# Patient Record
Sex: Male | Born: 1960 | Race: White | Hispanic: No | Smoking: Former smoker
Health system: Southern US, Community
[De-identification: ages and names within clinical notes are randomized; demographics above are authoritative.]

## PROBLEM LIST (undated history)

## (undated) DIAGNOSIS — I10 Essential (primary) hypertension: Secondary | ICD-10-CM

## (undated) DIAGNOSIS — R200 Anesthesia of skin: Secondary | ICD-10-CM

## (undated) DIAGNOSIS — Z973 Presence of spectacles and contact lenses: Secondary | ICD-10-CM

## (undated) DIAGNOSIS — I4891 Unspecified atrial fibrillation: Secondary | ICD-10-CM

## (undated) DIAGNOSIS — Z9889 Other specified postprocedural states: Secondary | ICD-10-CM

## (undated) DIAGNOSIS — J45909 Unspecified asthma, uncomplicated: Secondary | ICD-10-CM

## (undated) DIAGNOSIS — R52 Pain, unspecified: Secondary | ICD-10-CM

## (undated) DIAGNOSIS — F329 Major depressive disorder, single episode, unspecified: Secondary | ICD-10-CM

## (undated) DIAGNOSIS — G8929 Other chronic pain: Secondary | ICD-10-CM

## (undated) DIAGNOSIS — R51 Headache: Secondary | ICD-10-CM

## (undated) DIAGNOSIS — J449 Chronic obstructive pulmonary disease, unspecified: Secondary | ICD-10-CM

## (undated) DIAGNOSIS — M2540 Effusion, unspecified joint: Secondary | ICD-10-CM

## (undated) DIAGNOSIS — K429 Umbilical hernia without obstruction or gangrene: Secondary | ICD-10-CM

## (undated) DIAGNOSIS — Z972 Presence of dental prosthetic device (complete) (partial): Secondary | ICD-10-CM

## (undated) DIAGNOSIS — M545 Low back pain, unspecified: Secondary | ICD-10-CM

## (undated) DIAGNOSIS — I671 Cerebral aneurysm, nonruptured: Secondary | ICD-10-CM

## (undated) DIAGNOSIS — R351 Nocturia: Secondary | ICD-10-CM

## (undated) DIAGNOSIS — I2510 Atherosclerotic heart disease of native coronary artery without angina pectoris: Secondary | ICD-10-CM

## (undated) DIAGNOSIS — N189 Chronic kidney disease, unspecified: Secondary | ICD-10-CM

## (undated) DIAGNOSIS — R350 Frequency of micturition: Secondary | ICD-10-CM

## (undated) DIAGNOSIS — M1990 Unspecified osteoarthritis, unspecified site: Secondary | ICD-10-CM

## (undated) DIAGNOSIS — M2550 Pain in unspecified joint: Secondary | ICD-10-CM

## (undated) DIAGNOSIS — Z8489 Family history of other specified conditions: Secondary | ICD-10-CM

## (undated) DIAGNOSIS — Z8719 Personal history of other diseases of the digestive system: Secondary | ICD-10-CM

## (undated) DIAGNOSIS — E785 Hyperlipidemia, unspecified: Secondary | ICD-10-CM

## (undated) DIAGNOSIS — H269 Unspecified cataract: Secondary | ICD-10-CM

## (undated) DIAGNOSIS — F32A Depression, unspecified: Secondary | ICD-10-CM

## (undated) DIAGNOSIS — R112 Nausea with vomiting, unspecified: Secondary | ICD-10-CM

## (undated) DIAGNOSIS — I729 Aneurysm of unspecified site: Secondary | ICD-10-CM

## (undated) DIAGNOSIS — E119 Type 2 diabetes mellitus without complications: Secondary | ICD-10-CM

## (undated) DIAGNOSIS — K219 Gastro-esophageal reflux disease without esophagitis: Secondary | ICD-10-CM

## (undated) DIAGNOSIS — I639 Cerebral infarction, unspecified: Secondary | ICD-10-CM

## (undated) DIAGNOSIS — J189 Pneumonia, unspecified organism: Secondary | ICD-10-CM

## (undated) DIAGNOSIS — F319 Bipolar disorder, unspecified: Secondary | ICD-10-CM

## (undated) DIAGNOSIS — C4492 Squamous cell carcinoma of skin, unspecified: Secondary | ICD-10-CM

## (undated) DIAGNOSIS — G4730 Sleep apnea, unspecified: Secondary | ICD-10-CM

## (undated) HISTORY — PX: MULTIPLE TOOTH EXTRACTIONS: SHX2053

## (undated) HISTORY — PX: EXCISIONAL HEMORRHOIDECTOMY: SHX1541

## (undated) HISTORY — DX: Squamous cell carcinoma of skin, unspecified: C44.92

## (undated) HISTORY — DX: Chronic obstructive pulmonary disease, unspecified: J44.9

## (undated) HISTORY — PX: GANGLION CYST EXCISION: SHX1691

## (undated) HISTORY — PX: RECTAL SURGERY: SHX760

## (undated) HISTORY — PX: TRACHEOSTOMY CLOSURE: SHX458

## (undated) HISTORY — PX: COLONOSCOPY: SHX174

## (undated) HISTORY — DX: Atherosclerotic heart disease of native coronary artery without angina pectoris: I25.10

---
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
## 1994-10-12 HISTORY — PX: SHOULDER ARTHROSCOPY WITH ROTATOR CUFF REPAIR: SHX5685

## 1995-10-13 HISTORY — PX: SHOULDER OPEN ROTATOR CUFF REPAIR: SHX2407

## 1998-04-30 ENCOUNTER — Emergency Department (HOSPITAL_COMMUNITY): Admission: EM | Admit: 1998-04-30 | Discharge: 1998-04-30 | Payer: Self-pay | Admitting: Emergency Medicine

## 2000-06-07 ENCOUNTER — Emergency Department (HOSPITAL_COMMUNITY): Admission: EM | Admit: 2000-06-07 | Discharge: 2000-06-08 | Payer: Self-pay

## 2000-06-08 ENCOUNTER — Encounter: Payer: Self-pay | Admitting: Emergency Medicine

## 2002-03-24 ENCOUNTER — Ambulatory Visit (HOSPITAL_BASED_OUTPATIENT_CLINIC_OR_DEPARTMENT_OTHER): Admission: RE | Admit: 2002-03-24 | Discharge: 2002-03-24 | Payer: Self-pay | Admitting: Orthopedic Surgery

## 2002-08-29 ENCOUNTER — Emergency Department (HOSPITAL_COMMUNITY): Admission: EM | Admit: 2002-08-29 | Discharge: 2002-08-29 | Payer: Self-pay | Admitting: Emergency Medicine

## 2002-09-07 ENCOUNTER — Emergency Department (HOSPITAL_COMMUNITY): Admission: EM | Admit: 2002-09-07 | Discharge: 2002-09-08 | Payer: Self-pay

## 2002-12-21 ENCOUNTER — Emergency Department (HOSPITAL_COMMUNITY): Admission: EM | Admit: 2002-12-21 | Discharge: 2002-12-21 | Payer: Self-pay | Admitting: Emergency Medicine

## 2003-04-05 ENCOUNTER — Emergency Department (HOSPITAL_COMMUNITY): Admission: EM | Admit: 2003-04-05 | Discharge: 2003-04-05 | Payer: Self-pay

## 2003-11-14 ENCOUNTER — Emergency Department (HOSPITAL_COMMUNITY): Admission: EM | Admit: 2003-11-14 | Discharge: 2003-11-14 | Payer: Self-pay | Admitting: Emergency Medicine

## 2003-12-04 ENCOUNTER — Emergency Department (HOSPITAL_COMMUNITY): Admission: EM | Admit: 2003-12-04 | Discharge: 2003-12-04 | Payer: Self-pay | Admitting: Emergency Medicine

## 2004-03-16 ENCOUNTER — Emergency Department (HOSPITAL_COMMUNITY): Admission: EM | Admit: 2004-03-16 | Discharge: 2004-03-17 | Payer: Self-pay | Admitting: Emergency Medicine

## 2004-04-11 ENCOUNTER — Ambulatory Visit (HOSPITAL_COMMUNITY): Admission: RE | Admit: 2004-04-11 | Discharge: 2004-04-11 | Payer: Self-pay | Admitting: Orthopaedic Surgery

## 2004-08-11 ENCOUNTER — Emergency Department (HOSPITAL_COMMUNITY): Admission: EM | Admit: 2004-08-11 | Discharge: 2004-08-11 | Payer: Self-pay | Admitting: Emergency Medicine

## 2004-10-15 ENCOUNTER — Emergency Department (HOSPITAL_COMMUNITY): Admission: EM | Admit: 2004-10-15 | Discharge: 2004-10-15 | Payer: Self-pay | Admitting: Emergency Medicine

## 2005-12-16 ENCOUNTER — Emergency Department (HOSPITAL_COMMUNITY): Admission: EM | Admit: 2005-12-16 | Discharge: 2005-12-16 | Payer: Self-pay | Admitting: Emergency Medicine

## 2006-01-06 ENCOUNTER — Ambulatory Visit: Payer: Self-pay | Admitting: Family Medicine

## 2006-01-06 ENCOUNTER — Ambulatory Visit: Payer: Self-pay | Admitting: *Deleted

## 2006-01-08 ENCOUNTER — Ambulatory Visit (HOSPITAL_COMMUNITY): Admission: RE | Admit: 2006-01-08 | Discharge: 2006-01-08 | Payer: Self-pay | Admitting: Family Medicine

## 2006-01-14 ENCOUNTER — Ambulatory Visit: Payer: Self-pay | Admitting: Family Medicine

## 2006-09-22 ENCOUNTER — Emergency Department (HOSPITAL_COMMUNITY): Admission: EM | Admit: 2006-09-22 | Discharge: 2006-09-22 | Payer: Self-pay | Admitting: Emergency Medicine

## 2006-09-29 ENCOUNTER — Emergency Department (HOSPITAL_COMMUNITY): Admission: EM | Admit: 2006-09-29 | Discharge: 2006-09-29 | Payer: Self-pay | Admitting: Emergency Medicine

## 2006-12-07 ENCOUNTER — Emergency Department (HOSPITAL_COMMUNITY): Admission: EM | Admit: 2006-12-07 | Discharge: 2006-12-07 | Payer: Self-pay | Admitting: Emergency Medicine

## 2007-09-18 ENCOUNTER — Emergency Department (HOSPITAL_COMMUNITY): Admission: EM | Admit: 2007-09-18 | Discharge: 2007-09-18 | Payer: Self-pay | Admitting: Emergency Medicine

## 2007-09-27 ENCOUNTER — Encounter: Admission: RE | Admit: 2007-09-27 | Discharge: 2007-10-12 | Payer: Self-pay | Admitting: Physician Assistant

## 2007-10-03 ENCOUNTER — Ambulatory Visit: Payer: Self-pay | Admitting: Family Medicine

## 2007-10-12 ENCOUNTER — Emergency Department (HOSPITAL_COMMUNITY): Admission: EM | Admit: 2007-10-12 | Discharge: 2007-10-12 | Payer: Self-pay | Admitting: Family Medicine

## 2007-10-13 DIAGNOSIS — N189 Chronic kidney disease, unspecified: Secondary | ICD-10-CM

## 2007-10-13 HISTORY — DX: Chronic kidney disease, unspecified: N18.9

## 2007-10-17 ENCOUNTER — Emergency Department (HOSPITAL_COMMUNITY): Admission: EM | Admit: 2007-10-17 | Discharge: 2007-10-17 | Payer: Self-pay | Admitting: *Deleted

## 2007-10-25 ENCOUNTER — Encounter: Admission: RE | Admit: 2007-10-25 | Discharge: 2007-11-18 | Payer: Self-pay | Admitting: Physician Assistant

## 2008-12-24 ENCOUNTER — Emergency Department (HOSPITAL_COMMUNITY): Admission: EM | Admit: 2008-12-24 | Discharge: 2008-12-24 | Payer: Self-pay | Admitting: Emergency Medicine

## 2010-02-03 ENCOUNTER — Encounter (HOSPITAL_COMMUNITY): Admission: RE | Admit: 2010-02-03 | Discharge: 2010-04-15 | Payer: Self-pay | Admitting: Cardiology

## 2010-10-23 ENCOUNTER — Emergency Department (HOSPITAL_COMMUNITY)
Admission: EM | Admit: 2010-10-23 | Discharge: 2010-10-23 | Payer: Self-pay | Source: Home / Self Care | Admitting: Emergency Medicine

## 2010-12-30 LAB — HEPATIC FUNCTION PANEL
ALT: 24 U/L (ref 0–53)
Albumin: 4 g/dL (ref 3.5–5.2)
Alkaline Phosphatase: 65 U/L (ref 39–117)
Bilirubin, Direct: <0.1 mg/dL (ref 0.0–0.3)

## 2010-12-30 LAB — LIPID PANEL
LDL Cholesterol: 123 mg/dL — ABNORMAL HIGH (ref 0–99)
Total CHOL/HDL Ratio: 4.6 RATIO
Triglycerides: 86 mg/dL (ref <150)

## 2010-12-30 LAB — BASIC METABOLIC PANEL
BUN: 11 mg/dL (ref 6–23)
Glucose, Bld: 93 mg/dL (ref 70–99)
Sodium: 137 mEq/L (ref 135–145)

## 2011-01-22 LAB — RAPID STREP SCREEN (MED CTR MEBANE ONLY): Streptococcus, Group A Screen (Direct): NEGATIVE

## 2011-02-27 NOTE — Op Note (Signed)
[place]. [facility]  Patient:    [name], [name] Visit Number: [id number] MRN: [id number]          Service Type: DSU Location: [facility] Attending Physician:  Ronne Binning Dictated by:   Nicki Reaper, M.D. Proc. Date: 03/24/02 Admit Date:  03/24/2002                             Operative Report  PREOPERATIVE DIAGNOSIS:  Peripheral tear, capsule, left wrist.  POSTOPERATIVE DIAGNOSIS:  Peripheral tear, capsule, left wrist.  OPERATION:  Arthroscopy, debridement, removal of partial torn dorsal capsule ligaments, left wrist, with shrinkage of dorsal capsule triangular fibrocartilage complex tear.  SURGEON:  Nicki Reaper, M.D.  ANESTHESIA:  Axillary and general.  ANESTHESIOLOGIST:  Maren Beach, M.D.  HISTORY:  The patient is a 50 year old male who suffered an injury to his left wrist.  He has undergone conservative treatment without response.  MRI reveals leakage of contrast into the ECU tendon.  DESCRIPTION OF PROCEDURE:  The patient was brought to the operating room where an axillary block was carried out without difficulty.  He was prepped and draped using Betadine scrubbing solution with the left arm free in a supine position.  He had some feeling with inflation of the joint; a general anesthetic was given.  The joint was inflated through the 3.4 portal.  The portal was opened with a transverse incision through skin only and deepened with a Hemostat.  A blunt trocar was used to enter the joint.  Joint was inspected.  Volar radial wrist and ulnar volar ligaments were intact. Scapholunate and lunotriquetral ligaments were intact.  The cartilage on the carpus and distal radius were intact.  Triangular fibrocartilage was intact. There was a large flap of dorsal capsule extruding into the joint.  The irrigation catheter was placed in 6-U.  The 4.5 portal was opened after localization with a 22-gauge needle.  The midcarpal joint was then  inspected. ______ joint showed no changes.  The scapholunate ligament and lunotriquetral ligament showed no opening.  There was no significant change in the cartilage on the capitate or hamate of the proximal carpal row.  The scope was then reinserted into the 3.4 portal proximally, the joint reinspected, the ulnar side inspected through 4.5, the scope reintroduced in 3.4.  An ArthroWand was then introduced in 4.5 and debridement performed.  A full radius shaver was then introduced and the flap of ligament was removed without difficulty.  This was a partial tear and did not destabilize the TFCC, which showed a normal trampoline effect, and I was unable to pass a probe beneath the two lifted from the capsule; as such, it was decided not to proceed with a repair but simply proceed with shrinkage of the dorsal capsule.  It was then shrunk, tightening the TFCC to the dorsal capsule.  Instruments were removed.  Portals were closed with interrupted 5-0 nylon suture.  A sterile compressive dressing and splint were applied.  The patient tolerated the procedure well and was taken to the recovery room for observation in satisfactory condition.  He is discharged home to return to the [facility] in one week on Talwin NX and Keflex. Dictated by:   Nicki Reaper, M.D. Attending Physician:  Ronne Binning DD:  03/24/02 TD:  03/27/02 Job: [id number]

## 2011-02-27 NOTE — Consult Note (Signed)
NAMEKRISTJAN, [name] NO.:  [id number]   MEDICAL RECORD NO.:  [id number]          PATIENT TYPE:  EMS   LOCATION:  MAJO                         FACILITY:  [facility]   PHYSICIAN:  Ivan Dare. Janee [name], M.D.DATE OF BIRTH:  08-01-1961   DATE OF CONSULTATION:  09/22/2006  DATE OF DISCHARGE:  09/22/2006                                 CONSULTATION   REASON FOR CONSULTATION:  Left inguinal hernia.   HISTORY OF PRESENT ILLNESS:  I was asked to evaluate this pleasant 50-  year-old gentleman by Dr. Mancel Bale from the emergency department,  in regards to a left inguinal hernia.  The patient developed a hernia  approximately 2 days.  He describes it initially going down in the top  of his left scrotum.  It has been getting smaller over the past couple  of days, but it still painful.  He complains of pain in the area.  He  has no nausea and vomiting.  He has been moving his bowels.   PAST MEDICAL HISTORY:  Negative.   PAST SURGICAL HISTORY:  He has had several orthopedic procedures.   ALLERGIES:  OXYCODONE.   PHYSICAL EXAM:  VITAL SIGNS:  Temperature 98.2, blood pressure 150/71,  pulse 84, respirations 20, saturation 95% on room air.  GENERAL:  He is awake, alert, well.  Pupils are equal.  NECK:  Supple.  LUNGS:  Clear to auscultation.  HEART:  Regular and pulses palpable in the left chest.  ABDOMEN:  Soft and nontender.  Bowel sounds are normal.  No masses are  noted.  Right inguinal exam reveals no inguinal hernia. Left inguinal  exam reveals a small inguinal hernia.  This was reduced during the  examination with some mild discomfort at the area.  Testes are both  descended and not edematous.  SKIN:  Warm, dry with no rashes.   IMPRESSION:  Left inguinal hernia, which is symptomatic but is now  reduced.   PLAN:  We need to follow-up in our office or next few days for further  evaluation.  I gave him a prescription for Darvocet and some other  instructions.  He will  give [place] a call that his symptoms worsen or if the  hernia gets stuck out, and I discussed this plan with Dr. Effie Shy.      [name], M.D.  Electronically Signed     BET/MEDQ  D:  09/22/2006  T:  09/22/2006  Job:  [id number]

## 2011-07-01 LAB — I-STAT 8, (EC8 V) (CONVERTED LAB)
Bicarbonate: 29.4 — ABNORMAL HIGH
Chloride: 104
Glucose, Bld: 97
HCT: 46
Hemoglobin: 15.6
Potassium: 4
Sodium: 138
TCO2: 31
pCO2, Ven: 47.8
pH, Ven: 7.397 — ABNORMAL HIGH

## 2011-07-01 LAB — CBC
HCT: 43.8
Hemoglobin: 14.9
MCV: 89
RBC: 4.92
RDW: 12.4

## 2011-07-01 LAB — URINALYSIS, ROUTINE W REFLEX MICROSCOPIC
Glucose, UA: NEGATIVE
Ketones, ur: NEGATIVE
Protein, ur: NEGATIVE
pH: 5.5

## 2011-07-01 LAB — DIFFERENTIAL
Basophils Absolute: 0.1
Eosinophils Relative: 2

## 2011-07-01 LAB — POCT I-STAT CREATININE: Creatinine, Ser: 1

## 2011-07-18 ENCOUNTER — Emergency Department (HOSPITAL_COMMUNITY)
Admission: EM | Admit: 2011-07-18 | Discharge: 2011-07-18 | Disposition: A | Discharge status: Home / Self Care | Payer: BC Managed Care – PPO | Attending: Emergency Medicine | Admitting: Emergency Medicine

## 2011-07-18 DIAGNOSIS — G4730 Sleep apnea, unspecified: Secondary | ICD-10-CM | POA: Insufficient documentation

## 2011-07-18 DIAGNOSIS — IMO0002 Reserved for concepts with insufficient information to code with codable children: Secondary | ICD-10-CM | POA: Insufficient documentation

## 2011-07-18 DIAGNOSIS — L02219 Cutaneous abscess of trunk, unspecified: Secondary | ICD-10-CM | POA: Insufficient documentation

## 2011-07-18 DIAGNOSIS — J45909 Unspecified asthma, uncomplicated: Secondary | ICD-10-CM | POA: Insufficient documentation

## 2011-07-18 DIAGNOSIS — K219 Gastro-esophageal reflux disease without esophagitis: Secondary | ICD-10-CM | POA: Insufficient documentation

## 2011-07-18 DIAGNOSIS — L03319 Cellulitis of trunk, unspecified: Secondary | ICD-10-CM | POA: Insufficient documentation

## 2011-09-22 ENCOUNTER — Emergency Department (HOSPITAL_COMMUNITY)
Admission: EM | Admit: 2011-09-22 | Discharge: 2011-09-22 | Disposition: A | Discharge status: Home / Self Care | Payer: BC Managed Care – PPO | Attending: Emergency Medicine | Admitting: Emergency Medicine

## 2011-09-22 ENCOUNTER — Emergency Department (HOSPITAL_COMMUNITY): Payer: BC Managed Care – PPO

## 2011-09-22 ENCOUNTER — Encounter: Payer: Self-pay | Admitting: *Deleted

## 2011-09-22 DIAGNOSIS — M25531 Pain in right wrist: Secondary | ICD-10-CM

## 2011-09-22 DIAGNOSIS — M25539 Pain in unspecified wrist: Secondary | ICD-10-CM | POA: Insufficient documentation

## 2011-09-22 NOTE — ED Notes (Signed)
To ed for eval of right wrist pain for the past cple weeks. States he twisted wrist while pulling rope. No obvious deformity noted. Pain with any palpation, movement, or pressure.

## 2011-09-22 NOTE — ED Provider Notes (Signed)
History     CSN: [id number] Arrival date & time: 09/22/2011  4:04 PM   First MD Initiated Contact with Patient 09/22/11 1847      Chief Complaint  Patient presents with  . Wrist Pain    (Consider location/radiation/quality/duration/timing/severity/associated sxs/prior treatment) HPI The patient presents with right wrist pain. He notes the sudden onset of pain, and awkward twisting motion while at work in 6 weeks ago. Since that time he has had persistent pain in the right wrist.  The pain is focally about the medial posterior aspect. The pain is worse with motion particularly on her deviation. No attempts at analgesia, neither ice nor medications. Patient denies any distal numbness weakness tingling, or any other complaints. Past Medical History  Diagnosis Date  . Asthma     Past Surgical History  Procedure Date  . Tracheostomy closure   . Tracheostomy   . Joint replacement   . Fracture surgery     History reviewed. No pertinent family history.  History  Substance Use Topics  . Smoking status: Current Everyday Smoker -- 2.0 packs/day    Types: Cigarettes  . Smokeless tobacco: Not on file  . Alcohol Use: No      Review of Systems  All other systems reviewed and are negative.    Allergies  Tylox  Home Medications  No current outpatient prescriptions on file.  BP 128/86  Pulse 87  Temp(Src) 98.1 F (36.7 C) (Oral)  Resp 16  SpO2 93%  Physical Exam  Constitutional: He is oriented to person, place, and time. He appears well-developed and well-nourished. No distress.  HENT:  Head: Normocephalic and atraumatic.  Eyes: Conjunctivae and EOM are normal.  Cardiovascular: Normal rate.   Pulmonary/Chest: Effort normal. No stridor.  Musculoskeletal:       The right wrist isn't tender to palpation about the medial aspect without appreciable overlying edema or erythema. The range of motion is appropriate in all dimensions. There is no tenderness to palpation or  limitations of range of motion of the hand or digits.  Neurological: He is alert and oriented to person, place, and time. No cranial nerve deficit. He exhibits normal muscle tone. Coordination normal.  Skin: Skin is warm and dry. No rash noted. He is not diaphoretic. No erythema. No pallor.  Psychiatric: He has a normal mood and affect.    ED Course  Procedures (including critical care time)  Labs Reviewed - No data to display Dg Wrist Complete Right  09/22/2011  *RADIOLOGY REPORT*  Clinical Data: Medial right wrist pain.  RIGHT WRIST - COMPLETE 3+ VIEW  Comparison: Four view right wrist 10/15/2004.  Findings: The right wrist is located.  No acute bone or soft tissue abnormality is evident.  IMPRESSION: Negative right wrist.  Original Report Authenticated By: Jamesetta Orleans. MATTERN, M.D.     No diagnosis found.   X-ray reviewed by me no acute findings MDM  This generally well male presents with 6 weeks of continuous right wrist pain. The description of the onset as a sudden twisting motion is suggestive of sprain. The x-ray does not demonstrate acute pathology. Given these findings, the patient's generally benign appearance he is appropriate for continued outpatient evaluation of his wrist pain which is likely due to sprain. He will be placed in a wrist splint and follow up with orthopedics       Gerhard Munch, MD 09/22/11 [phone]

## 2011-09-22 NOTE — ED Notes (Signed)
Ortho paged for wrist splint. Will bring to bedside

## 2011-09-22 NOTE — Progress Notes (Signed)
Orthopedic Tech Progress Note Patient Details:  [name] 1961-07-17 [id number]  Splint Location: applied 6" velcro wrist splint to (R) UE Splint Interventions: Application    Jennye Moccasin 09/22/2011, 7:11 PM

## 2011-09-22 NOTE — ED Notes (Signed)
Ortho Tech applied splint to left wrist.

## 2012-03-16 ENCOUNTER — Emergency Department (HOSPITAL_COMMUNITY)
Admission: EM | Admit: 2012-03-16 | Discharge: 2012-03-17 | Disposition: A | Discharge status: Home / Self Care | Payer: BC Managed Care – PPO | Attending: Emergency Medicine | Admitting: Emergency Medicine

## 2012-03-16 ENCOUNTER — Encounter (HOSPITAL_COMMUNITY): Payer: Self-pay | Admitting: Emergency Medicine

## 2012-03-16 DIAGNOSIS — L989 Disorder of the skin and subcutaneous tissue, unspecified: Secondary | ICD-10-CM | POA: Insufficient documentation

## 2012-03-16 DIAGNOSIS — L03114 Cellulitis of left upper limb: Secondary | ICD-10-CM

## 2012-03-16 DIAGNOSIS — F17200 Nicotine dependence, unspecified, uncomplicated: Secondary | ICD-10-CM | POA: Insufficient documentation

## 2012-03-16 MED ORDER — SODIUM CHLORIDE 0.9 % IV SOLN
INTRAVENOUS | Status: DC
  Administered 2012-03-16: via INTRAVENOUS

## 2012-03-16 MED ORDER — CLINDAMYCIN PHOSPHATE 600 MG/50ML IV SOLN
600.0000 mg | Freq: Once | INTRAVENOUS | Status: AC
  Administered 2012-03-16: 600 mg via INTRAVENOUS
  Filled 2012-03-16: qty 50

## 2012-03-16 NOTE — ED Notes (Signed)
PT. REPORTS ABSCESS AT LEFT FOREARM WITH SWELLING AND DRAINAGE ONSET YESTERDAY.

## 2012-03-16 NOTE — ED Provider Notes (Signed)
History     CSN: [id number]  Arrival date & time 03/16/12  2139   First MD Initiated Contact with Patient 03/16/12 2241      Chief Complaint  Patient presents with  . Abscess    (Consider location/radiation/quality/duration/timing/severity/associated sxs/prior treatment) Patient is a 51 y.o. male presenting with abscess. The history is provided by the patient. No language interpreter was used.  Abscess  This is a new problem. The current episode started yesterday. The problem has been gradually worsening. The abscess is present on the left arm. The problem is moderate. The abscess is characterized by itchiness, redness, draining and swelling. The patient was exposed to an insect bite/sting. The abscess first occurred at home. Pertinent negatives include no fever.  Patient states he was bitten on left forearm by an unknown insect.  Small draining lesion noted to medial aspect of mid-forearm.  Area has been warm to touch, mild redness, edema.  Past Medical History  Diagnosis Date  . Asthma     Past Surgical History  Procedure Date  . Tracheostomy closure   . Tracheostomy   . Joint replacement   . Fracture surgery     No family history on file.  History  Substance Use Topics  . Smoking status: Current Everyday Smoker -- 2.0 packs/day    Types: Cigarettes  . Smokeless tobacco: Not on file  . Alcohol Use: No      Review of Systems  Constitutional: Negative for fever.  Skin: Negative for wound.  All other systems reviewed and are negative.    Allergies  Oxycodone-acetaminophen  Home Medications  No current outpatient prescriptions on file.  BP 119/75  Pulse 67  Temp(Src) 97.7 F (36.5 C) (Oral)  Resp 20  SpO2 97%  Physical Exam  Nursing note and vitals reviewed. Constitutional: He is oriented to person, place, and time. He appears well-developed and well-nourished.  HENT:  Head: Normocephalic.  Eyes: Pupils are equal, round, and reactive to light.    Neck: Normal range of motion.  Cardiovascular: Normal rate, regular rhythm, normal heart sounds and intact distal pulses.   Pulmonary/Chest: Effort normal.  Abdominal: Soft. Bowel sounds are normal.  Musculoskeletal: Normal range of motion. He exhibits edema and tenderness.       Arms: Lymphadenopathy:    He has no cervical adenopathy.  Neurological: He is alert and oriented to person, place, and time.  Skin: Skin is warm and dry. There is erythema.  Psychiatric: He has a normal mood and affect. His behavior is normal. Judgment and thought content normal.    ED Course  Procedures (including critical care time)  Labs Reviewed - No data to display No results found.   No diagnosis found.    MDM          Jimmye Norman, NP 03/17/12 [phone]

## 2012-03-16 NOTE — ED Notes (Signed)
States was bitten last night. Thought it was a mosquito. Swelling noted today. Believes it may have been a brown spider. Rates pain as 5/10.

## 2012-03-17 MED ORDER — DOXYCYCLINE HYCLATE 100 MG PO CAPS: 100.0000 mg | ORAL_CAPSULE | Freq: Two times a day (BID) | ORAL | Status: AC

## 2012-03-17 NOTE — Discharge Instructions (Signed)
Cellulitis Cellulitis is an infection of the skin and the tissue beneath it. The area is typically red and tender. It is caused by germs (bacteria) (usually staph or strep) that enter the body through cuts or sores. Cellulitis most commonly occurs in the arms or lower legs.  HOME CARE INSTRUCTIONS   If you are given a prescription for medications which kill germs (antibiotics), take as directed until finished.   If the infection is on the arm or leg, keep the limb elevated as able.   Use a warm cloth several times per day to relieve pain and encourage healing.   See your caregiver for recheck of the infected site as directed if problems arise.   Only take over-the-counter or prescription medicines for pain, discomfort, or fever as directed by your caregiver.  SEEK MEDICAL CARE IF:   The area of redness (inflammation) is spreading, there are red streaks coming from the infected site, or if a part of the infection begins to turn dark in color.   The joint or bone underneath the infected skin becomes painful after the skin has healed.   The infection returns in the same or another area after it seems to have gone away.   A boil or bump swells up. This may be an abscess.   New, unexplained problems such as pain or fever develop.  SEEK IMMEDIATE MEDICAL CARE IF:   You have a fever.   You or your child feels drowsy or lethargic.   There is vomiting, diarrhea, or lasting discomfort or feeling ill (malaise) with muscle aches and pains.  MAKE SURE YOU:   Understand these instructions.   Will watch your condition.   Will get help right away if you are not doing well or get worse.  Document Released: 07/08/2005 Document Revised: 09/17/2011 Document Reviewed: 05/16/2008 [facility] Patient Information 2012 [place], [place].  RETURN TO ED OR FOLLOW-UP WITH YOUR PRIMARY CARE PROVIDER IF NO IMPROVEMENT DESPITE TREATMENT.

## 2012-03-17 NOTE — ED Provider Notes (Signed)
Medical screening examination/treatment/procedure(s) were conducted as a shared visit with non-physician practitioner(s) and myself.  I personally evaluated the patient during the encounter  Infected insect bite with cellulitis. No fluid collection on [place].  Systemically well.  Glynn Octave, MD 03/17/12 [phone]

## 2012-05-12 DIAGNOSIS — R609 Edema, unspecified: Secondary | ICD-10-CM | POA: Insufficient documentation

## 2012-05-12 DIAGNOSIS — K469 Unspecified abdominal hernia without obstruction or gangrene: Secondary | ICD-10-CM | POA: Insufficient documentation

## 2012-05-12 DIAGNOSIS — R600 Localized edema: Secondary | ICD-10-CM | POA: Insufficient documentation

## 2012-06-12 DIAGNOSIS — J449 Chronic obstructive pulmonary disease, unspecified: Secondary | ICD-10-CM | POA: Insufficient documentation

## 2012-06-12 HISTORY — DX: Chronic obstructive pulmonary disease, unspecified: J44.9

## 2012-10-14 DIAGNOSIS — M549 Dorsalgia, unspecified: Secondary | ICD-10-CM | POA: Insufficient documentation

## 2012-10-19 ENCOUNTER — Ambulatory Visit (INDEPENDENT_AMBULATORY_CARE_PROVIDER_SITE_OTHER): Payer: BC Managed Care – PPO | Admitting: Physician Assistant

## 2012-10-19 VITALS — BP 124/75 | HR 58 | Temp 98.0°F | Resp 16 | Ht 68.8 in | Wt 245.8 lb

## 2012-10-19 DIAGNOSIS — J449 Chronic obstructive pulmonary disease, unspecified: Secondary | ICD-10-CM

## 2012-10-19 DIAGNOSIS — Z0000 Encounter for general adult medical examination without abnormal findings: Secondary | ICD-10-CM

## 2012-10-19 DIAGNOSIS — K219 Gastro-esophageal reflux disease without esophagitis: Secondary | ICD-10-CM | POA: Insufficient documentation

## 2012-10-19 NOTE — Progress Notes (Signed)
Subjective:    Patient ID: [name], male    DOB: [birth year], 52 y.o.   MRN: [id number]  HPI This 52 y.o. male presents for Annual Wellness Exam and needs a DOT form completed. He had a health maintenance exam with his PCP in September.  He had routine screening labs and prostate exam, and went for a colonoscopy as well.  He takes a PPI for GERD, triggered by NSAID use, but persistent after d/c of the offending agent, and was diagnosed with COPD at his health maintenance visit.  He's working on smoking cessation, and is down to 1 ppd from 2 ppd.  Past Medical History  Diagnosis Date  . Asthma     Last asthma attack at age 27; History of trach at 16 months  . COPD (chronic obstructive pulmonary disease) 06/2012    Dx by Zoe Lan, [facility]    Past Surgical History  Procedure Date  . Tracheostomy closure   . Tracheostomy   . Fracture surgery     NO HX of Broken Bone.  Has rotator cuff tear repair and shoulder/clavicle reconstruction  . Joint replacement     NO HX of JOINT REPLACEMENT.  Rotator cuff tear repair and shoulder reconstruction    Prior to Admission medications   Medication Sig Start Date End Date Taking? Authorizing Provider  albuterol-ipratropium (COMBIVENT) 18-103 MCG/ACT inhaler Inhale 2 puffs into the lungs every 6 (six) hours as needed.   Yes Historical Provider, MD  esomeprazole (NEXIUM) 40 MG capsule Take 40 mg by mouth as needed.   Yes Historical Provider, MD    Allergies  Allergen Reactions  . Tylox (Oxycodone-Acetaminophen) Rash    History   Social History  . Marital Status: Married    Spouse Name: not together since 2007    Number of Children: 3  . Years of Education: 10   Occupational History  . Engineer, production     [employer]    Social History Main Topics  . Smoking status: Current Every Day Smoker -- 1.0 packs/day    Types: Cigarettes    Start date: 01/15/1975  . Smokeless tobacco: Never Used     Comment: down from  2 ppd; has used an e-cigarette  . Alcohol Use: 1.8 oz/week    3 Cans of beer per week  . Drug Use: No  . Sexually Active: Yes -- Male partner(s)     Comment: partner has had surgery to prevent pregnancy   Social History Narrative   Lives with his son and his mother.  His son has no contact with the son's mother, though the patient is not legally separated from her.  She has a history of drug use and has been in prison several times.    Family History  Problem Relation Age of Onset  . Heart disease Mother     s/p 3V CABG  . Cancer Father 14    lung cancer; +tobacco  . Cancer Maternal Grandmother   . Heart disease Maternal Grandmother   . Cancer Paternal Grandmother   . Lupus Sister   . Multiple sclerosis Sister   . Anemia Daughter    Review of Systems  Constitutional: Negative.   HENT: Negative.   Eyes: Negative.   Respiratory: Negative.        Loud snoring.  No apnea.  Awakens feeling rested.  No daytime somnolence.  Cardiovascular: Negative.   Gastrointestinal: Negative.   Genitourinary: Negative.   Musculoskeletal: Negative.  Neck pain is intermittent, and work-up has been initiated by his PCP.  He had xrays done last week and he's waiting for the results.  Skin: Negative.   Neurological: Negative.   Hematological: Negative.   Psychiatric/Behavioral: Negative.        Objective:   Physical Exam  Vitals reviewed. Constitutional: He is oriented to person, place, and time. Vital signs are normal. He appears well-developed and well-nourished. He is active and cooperative.  Non-toxic appearance. He does not have a sickly appearance. He does not appear ill. No distress.  HENT:  Head: Normocephalic and atraumatic. No trismus in the jaw.  Right Ear: Hearing, tympanic membrane, external ear and ear canal normal.  Left Ear: Hearing, tympanic membrane, external ear and ear canal normal.  Nose: Nose normal.  Mouth/Throat: Uvula is midline, oropharynx is clear and moist  and mucous membranes are normal. He does not have dentures. No oral lesions. Normal dentition. No dental abscesses, uvula swelling, lacerations or dental caries.  Eyes: Conjunctivae normal and EOM are normal. Pupils are equal, round, and reactive to light. Right eye exhibits no discharge. Left eye exhibits no discharge. No scleral icterus.  Fundoscopic exam:      The right eye shows no arteriolar narrowing, no AV nicking, no exudate, no hemorrhage and no papilledema. The right eye shows red reflex.The right eye shows no venous pulsations.      The left eye shows no arteriolar narrowing, no AV nicking, no exudate, no hemorrhage and no papilledema. The left eye shows red reflex.The left eye shows no venous pulsations. Neck: Normal range of motion, full passive range of motion without pain and phonation normal. Neck supple. No spinous process tenderness and no muscular tenderness present. No rigidity. No tracheal deviation, no edema, no erythema and normal range of motion present. No thyromegaly present.  Cardiovascular: Normal rate, regular rhythm, S1 normal, S2 normal, normal heart sounds, intact distal pulses and normal pulses.  Exam reveals no gallop and no friction rub.   No murmur heard. Pulmonary/Chest: Effort normal and breath sounds normal. No respiratory distress. He has no wheezes. He has no rales.  Abdominal: Soft. Normal appearance and bowel sounds are normal. He exhibits no distension and no mass. There is no hepatosplenomegaly. There is no tenderness. There is no rebound and no guarding. A hernia is present. Hernia confirmed positive in the ventral area. Hernia confirmed negative in the right inguinal area and confirmed negative in the left inguinal area.    Genitourinary: Penis normal.  Musculoskeletal: Normal range of motion. He exhibits no edema and no tenderness.       Right shoulder: Normal.       Left shoulder: Normal.       Right elbow: Normal.      Left elbow: Normal.        Right wrist: Normal.       Left wrist: Normal.       Right hip: Normal.       Left hip: Normal.       Right knee: Normal.       Left knee: Normal.       Right ankle: Normal. Achilles tendon normal.       Left ankle: Normal. Achilles tendon normal.       Cervical back: Normal. He exhibits normal range of motion, no tenderness, no bony tenderness, no swelling, no edema, no deformity, no laceration, no pain, no spasm and normal pulse.       Thoracic back:  Normal.       Lumbar back: Normal.       Right upper arm: Normal.       Left upper arm: Normal.       Right forearm: Normal.       Left forearm: Normal.       Right hand: Normal.       Left hand: Normal.       Right upper leg: Normal.       Left upper leg: Normal.       Right lower leg: Normal.       Left lower leg: Normal.       Right foot: Normal.       Left foot: Normal.  Lymphadenopathy:       Head (right side): No submental, no submandibular, no tonsillar, no preauricular, no posterior auricular and no occipital adenopathy present.       Head (left side): No submental, no submandibular, no tonsillar, no preauricular, no posterior auricular and no occipital adenopathy present.    He has no cervical adenopathy.       Right: No inguinal and no supraclavicular adenopathy present.       Left: No inguinal and no supraclavicular adenopathy present.  Neurological: He is alert and oriented to person, place, and time. He has normal strength and normal reflexes. He displays no tremor. No cranial nerve deficit. He exhibits normal muscle tone. Coordination and gait normal.  Skin: Skin is warm, dry and intact. No abrasion, no ecchymosis, no laceration, no lesion and no rash noted. He is not diaphoretic. No cyanosis or erythema. No pallor. Nails show no clubbing.  Psychiatric: He has a normal mood and affect. His speech is normal and behavior is normal. Judgment and thought content normal. Cognition and memory are normal.      Assessment &  Plan:   1. Routine general medical examination at a health care facility   2. COPD (chronic obstructive pulmonary disease)   3. 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

## 2012-10-19 NOTE — Patient Instructions (Signed)

## 2012-10-29 ENCOUNTER — Emergency Department (HOSPITAL_COMMUNITY)
Admission: EM | Admit: 2012-10-29 | Discharge: 2012-10-29 | Disposition: A | Discharge status: Home / Self Care | Payer: BC Managed Care – PPO | Attending: Emergency Medicine | Admitting: Emergency Medicine

## 2012-10-29 ENCOUNTER — Encounter (HOSPITAL_COMMUNITY): Payer: Self-pay | Admitting: Emergency Medicine

## 2012-10-29 ENCOUNTER — Emergency Department (HOSPITAL_COMMUNITY): Payer: BC Managed Care – PPO

## 2012-10-29 DIAGNOSIS — Z8739 Personal history of other diseases of the musculoskeletal system and connective tissue: Secondary | ICD-10-CM | POA: Insufficient documentation

## 2012-10-29 DIAGNOSIS — J45909 Unspecified asthma, uncomplicated: Secondary | ICD-10-CM | POA: Insufficient documentation

## 2012-10-29 DIAGNOSIS — M25519 Pain in unspecified shoulder: Secondary | ICD-10-CM | POA: Insufficient documentation

## 2012-10-29 DIAGNOSIS — F17200 Nicotine dependence, unspecified, uncomplicated: Secondary | ICD-10-CM | POA: Insufficient documentation

## 2012-10-29 DIAGNOSIS — I10 Essential (primary) hypertension: Secondary | ICD-10-CM | POA: Insufficient documentation

## 2012-10-29 DIAGNOSIS — Z79899 Other long term (current) drug therapy: Secondary | ICD-10-CM | POA: Insufficient documentation

## 2012-10-29 MED ORDER — HYDROCODONE-ACETAMINOPHEN 5-325 MG PO TABS: 1.0000 | ORAL_TABLET | ORAL | Status: DC | PRN

## 2012-10-29 NOTE — ED Notes (Signed)
Reports dislocated right shoulder 2007 treated [facility] & reduction was done. 2 months ago started experiening right pain worse with certain kind of way. Stated he feels like it's his rotor cuff. Stated he's here for an xray & will need referral for an orthopedic.

## 2012-10-29 NOTE — ED Provider Notes (Signed)
History     CSN: [id number]  Arrival date & time 10/29/12  1640   First MD Initiated Contact with Patient 10/29/12 1748      Chief Complaint  Patient presents with  . Shoulder Pain    (Consider location/radiation/quality/duration/timing/severity/associated sxs/prior treatment) HPI  Patient presents to the ER for evaluation of his right shoulder. He says that he dislocated it a couple of years ago and had it put back in place. He never did the follow-up with Ortho and now its really bothering him again. He needs a referral to the Orthopedist because he feels now might be the time to have the surgery. He denies re injuring the shoulder or having any other complaints at this time. Denies neck pain, arm swelling, numbness, tingling or weakness. nad vss  Past Medical History  Diagnosis Date  . Asthma     Last asthma attack at age 77; History of trach at 16 months  . COPD (chronic obstructive pulmonary disease) 06/2012    Dx by Zoe Lan, [facility]    Past Surgical History  Procedure Date  . Tracheostomy closure   . Tracheostomy   . Fracture surgery     NO HX of Broken Bone.  Has rotator cuff tear repair and shoulder/clavicle reconstruction  . Joint replacement     NO HX of JOINT REPLACEMENT.  Rotator cuff tear repair and shoulder reconstruction    Family History  Problem Relation Age of Onset  . Heart disease Mother     s/p 3V CABG  . Cancer Father 48    lung cancer; +tobacco  . Cancer Maternal Grandmother   . Heart disease Maternal Grandmother   . Cancer Paternal Grandmother   . Lupus Sister   . Multiple sclerosis Sister   . Anemia Daughter     History  Substance Use Topics  . Smoking status: Current Every Day Smoker -- 1.0 packs/day    Types: Cigarettes    Start date: 01/15/1975  . Smokeless tobacco: Never Used     Comment: down from 2 ppd; has used an e-cigarette  . Alcohol Use: 1.8 oz/week    3 Cans of beer per week      Review of  Systems  Review of Systems  Gen: no weight loss, fevers, chills, night sweats  Eyes: no discharge or drainage, no occular pain or visual changes  Nose: no epistaxis or rhinorrhea  Mouth: no dental pain, no sore throat  Neck: no neck pain  Lungs:No wheezing, coughing or hemoptysis CV: no chest pain, palpitations, dependent edema or orthopnea  Abd: no abdominal pain, nausea, vomiting  GU: no dysuria or gross hematuria  MSK:  + right shoulder pain Neuro: no headache, no focal neurologic deficits  Skin: no abnormalities Psyche: negative.   Allergies  Tylox  Home Medications   Current Outpatient Rx  Name  Route  Sig  Dispense  Refill  . OMEPRAZOLE 40 MG PO CPDR   Oral   Take 40 mg by mouth daily as needed. For acid reflux         . IPRATROPIUM-ALBUTEROL 18-103 MCG/ACT IN AERO   Inhalation   Inhale 2 puffs into the lungs every 6 (six) hours as needed. For shortness of breath         . HYDROCODONE-ACETAMINOPHEN 5-325 MG PO TABS   Oral   Take 1-2 tablets by mouth every 4 (four) hours as needed for pain.   10 tablet   0  BP 115/64  Pulse 66  Temp 97.9 F (36.6 C)  Resp 18  Ht 5\' 8"  (1.727 m)  Wt 245 lb (111.131 kg)  BMI 37.25 kg/m2  SpO2 96%  Physical Exam  Nursing note and vitals reviewed. Constitutional: He appears well-developed and well-nourished. No distress.  HENT:  Head: Normocephalic and atraumatic.  Eyes: Pupils are equal, round, and reactive to light.  Neck: Normal range of motion. Neck supple.  Cardiovascular: Normal rate and regular rhythm.   Pulmonary/Chest: Effort normal.  Abdominal: Soft.  Musculoskeletal:       Left shoulder: He exhibits decreased range of motion, tenderness, pain and spasm. He exhibits no bony tenderness, no swelling, no effusion, no crepitus, no deformity, no laceration, normal pulse and normal strength.  Neurological: He is alert.  Skin: Skin is warm and dry.    ED Course  Procedures (including critical care  time)  Labs Reviewed - No data to display No results found.   1. Shoulder pain       MDM  Referral to ortho and vicodin. Pt has arm sling at home that he will use. No concerning findings on exam- no new injury or weakness.  Pt has been advised of the symptoms that warrant their return to the ED. Patient has voiced understanding and has agreed to follow-up with the PCP or specialist.         Dorthula Matas, PA 11/04/12 [phone]

## 2012-10-29 NOTE — ED Notes (Signed)
Patient transported to X-ray 

## 2012-11-04 NOTE — ED Provider Notes (Signed)
Medical screening examination/treatment/procedure(s) were performed by non-physician practitioner and as supervising physician I was immediately available for consultation/collaboration.  Chrissa Meetze R. Jahaad Penado, MD 11/04/12 0652 

## 2013-01-13 DIAGNOSIS — J45909 Unspecified asthma, uncomplicated: Secondary | ICD-10-CM | POA: Insufficient documentation

## 2013-03-04 ENCOUNTER — Emergency Department (HOSPITAL_COMMUNITY)
Admission: EM | Admit: 2013-03-04 | Discharge: 2013-03-04 | Disposition: A | Discharge status: Home / Self Care | Payer: BC Managed Care – PPO | Attending: Emergency Medicine | Admitting: Emergency Medicine

## 2013-03-04 ENCOUNTER — Emergency Department (HOSPITAL_COMMUNITY): Payer: BC Managed Care – PPO

## 2013-03-04 ENCOUNTER — Encounter (HOSPITAL_COMMUNITY): Payer: Self-pay | Admitting: Emergency Medicine

## 2013-03-04 DIAGNOSIS — S6990XA Unspecified injury of unspecified wrist, hand and finger(s), initial encounter: Secondary | ICD-10-CM | POA: Insufficient documentation

## 2013-03-04 DIAGNOSIS — Y9389 Activity, other specified: Secondary | ICD-10-CM | POA: Insufficient documentation

## 2013-03-04 DIAGNOSIS — J449 Chronic obstructive pulmonary disease, unspecified: Secondary | ICD-10-CM | POA: Insufficient documentation

## 2013-03-04 DIAGNOSIS — S79912A Unspecified injury of left hip, initial encounter: Secondary | ICD-10-CM

## 2013-03-04 DIAGNOSIS — F17200 Nicotine dependence, unspecified, uncomplicated: Secondary | ICD-10-CM | POA: Insufficient documentation

## 2013-03-04 DIAGNOSIS — IMO0002 Reserved for concepts with insufficient information to code with codable children: Secondary | ICD-10-CM | POA: Insufficient documentation

## 2013-03-04 DIAGNOSIS — Y92009 Unspecified place in unspecified non-institutional (private) residence as the place of occurrence of the external cause: Secondary | ICD-10-CM | POA: Insufficient documentation

## 2013-03-04 DIAGNOSIS — S6980XA Other specified injuries of unspecified wrist, hand and finger(s), initial encounter: Secondary | ICD-10-CM | POA: Insufficient documentation

## 2013-03-04 DIAGNOSIS — Z79899 Other long term (current) drug therapy: Secondary | ICD-10-CM | POA: Insufficient documentation

## 2013-03-04 DIAGNOSIS — S79929A Unspecified injury of unspecified thigh, initial encounter: Secondary | ICD-10-CM | POA: Insufficient documentation

## 2013-03-04 DIAGNOSIS — W108XXA Fall (on) (from) other stairs and steps, initial encounter: Secondary | ICD-10-CM | POA: Insufficient documentation

## 2013-03-04 DIAGNOSIS — J4489 Other specified chronic obstructive pulmonary disease: Secondary | ICD-10-CM | POA: Insufficient documentation

## 2013-03-04 DIAGNOSIS — W19XXXA Unspecified fall, initial encounter: Secondary | ICD-10-CM

## 2013-03-04 DIAGNOSIS — S6991XA Unspecified injury of right wrist, hand and finger(s), initial encounter: Secondary | ICD-10-CM

## 2013-03-04 DIAGNOSIS — S79919A Unspecified injury of unspecified hip, initial encounter: Secondary | ICD-10-CM | POA: Insufficient documentation

## 2013-03-04 MED ORDER — NAPROXEN 250 MG PO TABS
500.0000 mg | ORAL_TABLET | Freq: Once | ORAL | Status: AC
  Administered 2013-03-04: 500 mg via ORAL
  Filled 2013-03-04: qty 2

## 2013-03-04 MED ORDER — NAPROXEN 500 MG PO TABS: 500.0000 mg | ORAL_TABLET | Freq: Two times a day (BID) | ORAL | Status: DC

## 2013-03-04 NOTE — ED Provider Notes (Signed)
History    This chart was scribed for non-physician practitioner working with Derwood Kaplan, MD by Leone Payor, ED Scribe. This patient was seen in room TR08C/TR08C and the patient's care was started at 1504.   CSN: [id number]  Arrival date & time 03/04/13  1504   First MD Initiated Contact with Patient 03/04/13 1552      Chief Complaint  Patient presents with  . Fall     The history is provided by the patient. No language interpreter was used.    HPI Comments: [name] is a 52 y.o. male who presents to the Emergency Department complaining of a fall that occurred yesterday at 6pm. He was playing with his son and he missed a step on his porch. Pt states he has pain to his L hip and R hand fingers. He also has scrapes to his R knee. He has not taken anything for the pain. Walking and weight bearing aggravates the pain and nothing alleviates the pain. He denies any numbness or tingling anywhere.    Past Medical History  Diagnosis Date  . Asthma     Last asthma attack at age 62; History of trach at 16 months  . COPD (chronic obstructive pulmonary disease) 06/2012    Dx by Zoe Lan, [facility]    Past Surgical History  Procedure Laterality Date  . Tracheostomy closure    . Tracheostomy    . Fracture surgery      NO HX of Broken Bone.  Has rotator cuff tear repair and shoulder/clavicle reconstruction  . Joint replacement      NO HX of JOINT REPLACEMENT.  Rotator cuff tear repair and shoulder reconstruction    Family History  Problem Relation Age of Onset  . Heart disease Mother     s/p 3V CABG  . Cancer Father 72    lung cancer; +tobacco  . Cancer Maternal Grandmother   . Heart disease Maternal Grandmother   . Cancer Paternal Grandmother   . Lupus Sister   . Multiple sclerosis Sister   . Anemia Daughter     History  Substance Use Topics  . Smoking status: Current Every Day Smoker -- 1.00 packs/day    Types: Cigarettes    Start date:  01/15/1975  . Smokeless tobacco: Never Used     Comment: down from 2 ppd; has used an e-cigarette  . Alcohol Use: 1.8 oz/week    3 Cans of beer per week      Review of Systems  Allergies  Tylox  Home Medications   Current Outpatient Rx  Name  Route  Sig  Dispense  Refill  . albuterol-ipratropium (COMBIVENT) 18-103 MCG/ACT inhaler   Inhalation   Inhale 2 puffs into the lungs every 6 (six) hours as needed. For shortness of breath         . esomeprazole (NEXIUM) 40 MG capsule   Oral   Take 40 mg by mouth daily as needed. For heartburn           BP 129/58  Pulse 68  Temp(Src) 98.3 F (36.8 C) (Oral)  Resp 20  SpO2 95%  Physical Exam  Nursing note and vitals reviewed. Constitutional: He is oriented to person, place, and time. He appears well-developed and well-nourished. No distress.  HENT:  Head: Normocephalic and atraumatic.  Eyes: EOM are normal.  Neck: Neck supple. No tracheal deviation present.  Cardiovascular: Normal rate.   Pulmonary/Chest: Effort normal. No respiratory distress.  Abdominal:  There is no tenderness.  Musculoskeletal: Normal range of motion.  R index finger tender to palpation over PIP. Mild swelling and bruising noted. Limited ROM due to pain. L lateral hip tenderness to palpation without any obvious deformity. No midline spine tenderness to palpation.   Neurological: He is alert and oriented to person, place, and time.  Skin: Skin is warm and dry.  Abrasions to L knee. No active bleeding.   Psychiatric: He has a normal mood and affect. His behavior is normal.    ED Course  Procedures (including critical care time)  DIAGNOSTIC STUDIES: Oxygen Saturation is 95% on room air, adequate by my interpretation.    COORDINATION OF CARE: 5:36 PM Discussed treatment plan with pt at bedside and pt agreed to plan.   Labs Reviewed - No data to display Dg Pelvis 1-2 Views  03/04/2013   *RADIOLOGY REPORT*  Clinical Data: Larey Seat off the porch  yesterday.  Left hip pain.  PELVIS - 1-2 VIEW  Comparison: CT of the abdomen and pelvis 10/17/2007, lumbar spine 09/29/2006  Findings: There is no evidence for acute fracture or dislocation. No soft tissue foreign body or gas identified.  IMPRESSION: Negative exam.   Original Report Authenticated By: Norva Pavlov, M.D.   Dg Hand Complete Right  03/04/2013   *RADIOLOGY REPORT*  Clinical Data: Larey Seat from porch yesterday.  Right hand pain.  Index and middle finger swelling.  RIGHT HAND - COMPLETE 3+ VIEW  Comparison: Right breast 09/22/2011  Findings: There are degenerative changes, primarily involving the proximal interphalangeal and distal interphalangeal joints of the second and third digits.  No evidence for acute fracture or subluxation.  No radiopaque foreign body or soft tissue gas.  IMPRESSION: No evidence for acute  abnormality.   Original Report Authenticated By: Norva Pavlov, M.D.     1. Fall, initial encounter   2. Hip injury, left, initial encounter   3. Injury of index finger, right, initial encounter       MDM  6:47 PM Xrays unremarkable. Patient likely experiencing pain from bruising after injuries. Patient will have Naproxen for pain relief. Patient instructed to ice and elevate injuries. Patient instructed to return with worsening or concerning symptoms.      I personally performed the services described in this documentation, which was scribed in my presence. The recorded information has been reviewed and is accurate.   Emilia Beck, PA-C 03/04/13 1854   Medical screening examination/treatment/procedure(s) were performed by non-physician practitioner and as supervising physician I was immediately available for consultation/collaboration.   Derwood Kaplan, MD 03/05/13 [phone]

## 2013-03-04 NOTE — ED Notes (Signed)
Pt presents to ED today with c/o pain to left hip and fingers on right hand after falling last night. Pt walked into ED  And into triage without difficulty.

## 2013-08-25 ENCOUNTER — Other Ambulatory Visit: Payer: Self-pay | Admitting: Sports Medicine

## 2013-08-25 DIAGNOSIS — M25511 Pain in right shoulder: Secondary | ICD-10-CM

## 2013-09-06 ENCOUNTER — Other Ambulatory Visit: Payer: BC Managed Care – PPO

## 2013-10-12 DIAGNOSIS — I639 Cerebral infarction, unspecified: Secondary | ICD-10-CM

## 2013-10-12 HISTORY — DX: Cerebral infarction, unspecified: I63.9

## 2013-10-12 HISTORY — PX: ANEURYSM COILING: SHX5349

## 2013-10-28 ENCOUNTER — Ambulatory Visit (INDEPENDENT_AMBULATORY_CARE_PROVIDER_SITE_OTHER): Payer: Self-pay | Admitting: Family Medicine

## 2013-10-28 ENCOUNTER — Ambulatory Visit: Payer: Self-pay

## 2013-10-28 VITALS — BP 110/66 | HR 67 | Temp 98.0°F | Resp 16

## 2013-10-28 DIAGNOSIS — M79609 Pain in unspecified limb: Secondary | ICD-10-CM

## 2013-10-28 DIAGNOSIS — S61409A Unspecified open wound of unspecified hand, initial encounter: Secondary | ICD-10-CM

## 2013-10-28 DIAGNOSIS — S61211A Laceration without foreign body of left index finger without damage to nail, initial encounter: Secondary | ICD-10-CM

## 2013-10-28 DIAGNOSIS — S61412A Laceration without foreign body of left hand, initial encounter: Secondary | ICD-10-CM

## 2013-10-28 DIAGNOSIS — M79642 Pain in left hand: Secondary | ICD-10-CM

## 2013-10-28 DIAGNOSIS — S61209A Unspecified open wound of unspecified finger without damage to nail, initial encounter: Secondary | ICD-10-CM

## 2013-10-28 NOTE — Progress Notes (Signed)
Patient ID: Ivan ClontsRichard L [name] MRN: [id number], DOB: 10/21/1960, 53 y.o. Date of Encounter: 10/28/2013, 2:30 PM   PROCEDURE NOTE: Verbal consent obtained. Sterile technique employed. Numbing: Anesthesia obtained with 2% lidocaine plain.   Cleansed with soap and water. Irrigated.  Wound explored, no deep structures involved, no foreign bodies.   Wound repaired with # 12 SI sutures and #1 HM suture.  Hemostasis obtained. Wound cleansed and dressed.  Wound care instructions including precautions covered with patient. Handout given.  Anticipate suture removal in 10-12 days  Rhoderick MoodyHeather Olena Willy, PA-C 10/28/2013 2:30 PM

## 2013-10-28 NOTE — Progress Notes (Addendum)
Subjective:    Patient ID: [name], male    DOB: 08/12/1961, 53 y.o.   MRN: [id number]  HPI This chart was scribed for Ivan StaggersJeffrey Devontae Casasola, MD by Andrew Auaven Small, ED Scribe. This patient was seen in room 6 and the patient's care was started at 1:14 PM.  HPI Comments: [name] is a 53 y.o. male who presents to the [facility] complaining of laceration to left hand onset 20 minutes ago. Pt reports he was put up a ceiling fan and while stripping wires he sliced his left hand using a new sharp steak knife. He reports that he did not put anything on the laceration. Pt is allergic to Tylox. Pt reports he's had similar injury to same hand. Pt is right hand dominant.  Last tdap - 11/21/10 per [facility].   [name]. Patient Active Problem List   Diagnosis Date Noted  . GERD (gastroesophageal reflux disease) 10/19/2012  . COPD (chronic obstructive pulmonary disease) 06/12/2012   Past Medical History  Diagnosis Date  . Asthma     Last asthma attack at age 88; History of trach at 16 months  . COPD (chronic obstructive pulmonary disease) 06/2012    D[facility]Penny Jones, [facility]   Past Surgical History  Procedure Laterality Date  . Tracheostomy closure    . Tracheostomy    . Fracture surgery      NO HX of Broken Bone.  Has rotator cuff tear repair and shoulder/clavicle reconstruction  . Joint replacement      NO HX of JOINT REPLACEMENT.  Rotator cuff tear repair and shoulder reconstruction   Allergies  Allergen Reactions  . Tylox [Oxycodone-Acetaminophen] Hives and Other (See Comments)    Reaction: Tremors and diaphoresis    Prior to Admission medications   Medication Sig Start Date End Date Taking? Authorizing Provider  albuterol-ipratropium (COMBIVENT) 18-103 MCG/ACT inhaler Inhale 2 puffs into the lungs every 6 (six) hours as needed. For shortness of breath    Historical Provider, MD  esomeprazole (NEXIUM) 40 MG capsule Take 40 mg by mouth daily as needed.  For heartburn    Historical Provider, MD  naproxen (NAPROSYN) 500 MG tablet Take 1 tablet (500 mg total) by mouth 2 (two) times daily with a meal. 03/04/13   Emilia BeckKaitlyn Szekalski, PA-C   History   Social History  . Marital Status: Married    Spouse Name: not together since 2007    Number of Children: 3  . Years of Education: 10   Occupational History  . Engineer, productionield Technician     [employer]    Social History Main Topics  . Smoking status: Current Every Day Smoker -- 1.00 packs/day    Types: Cigarettes    Start date: 01/15/1975  . Smokeless tobacco: Never Used     Comment: down from 2 ppd; has used an e-cigarette  . Alcohol Use: 1.8 oz/week    3 Cans of beer per week  . Drug Use: No  . Sexual Activity: Yes    Partners: Female     Comment: partner has had surgery to prevent pregnancy   Other Topics Concern  . Not on file   Social History Narrative   Lives with his son and his mother.  His son has no contact with the son's mother, though the patient is not legally separated from her.  She has a history of drug use and has been in prison several times.    Review  of Systems  Constitutional: Negative for fever and chills.  Skin: Positive for wound.       Objective:   Physical Exam  Nursing note and vitals reviewed. Constitutional: He is oriented to person, place, and time. He appears well-developed and well-nourished. No distress.  HENT:  Head: Normocephalic and atraumatic.  Eyes: EOM are normal.  Neck: Neck supple. No tracheal deviation present.  Cardiovascular: Normal rate.   Pulmonary/Chest: Effort normal. No respiratory distress.  Musculoskeletal: Normal range of motion. He exhibits tenderness (as above. ).       Left hand: Normal sensation noted. Normal strength noted.       Hands: Neurological: He is alert and oriented to person, place, and time.  Skin: Skin is warm and dry. Laceration noted.  Left hand: 6 cm curvilinear laceration of radial aspect of 2nd mcp to finger and  slight dorsal to hand hemostatic  slight serrated component to wound  2nd digit: full strength with flexion and extention  Intact sharp and dull discrimination 2 point senstation CAP refill less then 1 sec    Psychiatric: He has a normal mood and affect. His behavior is normal.   Filed Vitals:   10/28/13 1306  BP: 110/66  Pulse: 67  Temp: 98 F (36.7 C)  TempSrc: Oral  Resp: 16  SpO2: 98%   [facility] reading (PRIMARY) by  Dr.Yancarlos Berthold: L hand/2nd phalynx: no apparent fracture or fb.      Assessment & Plan:  [name] is a 53 y.o. male Laceration of index finger of left hand without complication - Plan: DG Finger Index Left  Laceration of hand, left  Pain of left hand  Wound of L hand today, repaired per procedure note. Td is up to date. rtc and wound care precautions reviewed.   Patient Instructions  For pain - tylenol over the counter or up to 600mg  ibuprofen every 6 hours as needed with food. Keep clean and covered, and if any redness or sign of infection - return right away. Try to avoid direct pressure to affected area until sutures removed. Return to the clinic or go to the nearest emergency room if any of your symptoms worsen or new symptoms occur.   WOUND CARE Please return in 10 -12 days to have your stitches/staples removed or sooner if you have concerns. [name] Keep area clean and dry for 24 hours. Do not remove bandage, if applied. . After 24 hours, remove bandage and wash wound gently with mild soap and warm water. Reapply a new bandage after cleaning wound, if directed. . Continue daily cleansing with soap and water until stitches/staples are removed. . Do not apply any ointments or creams to the wound while stitches/staples are in place, as this may cause delayed healing. . Notify the office if you experience any of the following signs of infection: Swelling, redness, pus drainage, streaking, fever >101.0 F . Notify the office if you experience excessive  bleeding that does not stop after 15-20 minutes of constant, firm pressure.          I personally performed the services described in this documentation, which was scribed in my presence. The recorded information has been reviewed and considered, and addended by me as needed.

## 2013-10-28 NOTE — Patient Instructions (Addendum)
For pain - tylenol over the counter or up to 600mg  ibuprofen every 6 hours as needed with food. Keep clean and covered, and if any redness or sign of infection - return right away. Try to avoid direct pressure to affected area until sutures removed. Return to the clinic or go to the nearest emergency room if any of your symptoms worsen or new symptoms occur.   WOUND CARE Please return in 10 -12 days to have your stitches/staples removed or sooner if you have concerns. [name]. Keep area clean and dry for 24 hours. Do not remove bandage, if applied. . After 24 hours, remove bandage and wash wound gently with mild soap and warm water. Reapply a new bandage after cleaning wound, if directed. . Continue daily cleansing with soap and water until stitches/staples are removed. . Do not apply any ointments or creams to the wound while stitches/staples are in place, as this may cause delayed healing. . Notify the office if you experience any of the following signs of infection: Swelling, redness, pus drainage, streaking, fever >101.0 F . Notify the office if you experience excessive bleeding that does not stop after 15-20 minutes of constant, firm pressure.

## 2014-05-13 ENCOUNTER — Emergency Department (HOSPITAL_COMMUNITY)
Admission: EM | Admit: 2014-05-13 | Discharge: 2014-05-14 | Disposition: A | Discharge status: Home / Self Care | Payer: BC Managed Care – PPO | Attending: Emergency Medicine | Admitting: Emergency Medicine

## 2014-05-13 ENCOUNTER — Encounter (HOSPITAL_COMMUNITY): Payer: Self-pay | Admitting: Emergency Medicine

## 2014-05-13 DIAGNOSIS — R197 Diarrhea, unspecified: Secondary | ICD-10-CM | POA: Insufficient documentation

## 2014-05-13 DIAGNOSIS — R109 Unspecified abdominal pain: Secondary | ICD-10-CM | POA: Insufficient documentation

## 2014-05-13 DIAGNOSIS — Z79899 Other long term (current) drug therapy: Secondary | ICD-10-CM | POA: Insufficient documentation

## 2014-05-13 DIAGNOSIS — R1012 Left upper quadrant pain: Secondary | ICD-10-CM | POA: Insufficient documentation

## 2014-05-13 DIAGNOSIS — J4489 Other specified chronic obstructive pulmonary disease: Secondary | ICD-10-CM | POA: Insufficient documentation

## 2014-05-13 DIAGNOSIS — F17200 Nicotine dependence, unspecified, uncomplicated: Secondary | ICD-10-CM | POA: Insufficient documentation

## 2014-05-13 DIAGNOSIS — J449 Chronic obstructive pulmonary disease, unspecified: Secondary | ICD-10-CM | POA: Insufficient documentation

## 2014-05-13 LAB — URINALYSIS, ROUTINE W REFLEX MICROSCOPIC
Bilirubin Urine: NEGATIVE
GLUCOSE, UA: NEGATIVE mg/dL
Hgb urine dipstick: NEGATIVE
Ketones, ur: NEGATIVE mg/dL
LEUKOCYTES UA: NEGATIVE
NITRITE: NEGATIVE
PROTEIN: 30 mg/dL — AB
Specific Gravity, Urine: 1.022 (ref 1.005–1.030)
UROBILINOGEN UA: 1 mg/dL (ref 0.0–1.0)
pH: 7.5 (ref 5.0–8.0)

## 2014-05-13 LAB — COMPREHENSIVE METABOLIC PANEL
ALT: 19 U/L (ref 0–53)
AST: 19 U/L (ref 0–37)
Albumin: 4.1 g/dL (ref 3.5–5.2)
Alkaline Phosphatase: 65 U/L (ref 39–117)
Anion gap: 11 (ref 5–15)
BUN: 12 mg/dL (ref 6–23)
CALCIUM: 9.1 mg/dL (ref 8.4–10.5)
CO2: 28 meq/L (ref 19–32)
CREATININE: 1.2 mg/dL (ref 0.50–1.35)
Chloride: 100 mEq/L (ref 96–112)
GFR calc Af Amer: 79 mL/min — ABNORMAL LOW (ref >90)
GFR, EST NON AFRICAN AMERICAN: 68 mL/min — AB (ref >90)
Glucose, Bld: 81 mg/dL (ref 70–99)
Potassium: 4.1 mEq/L (ref 3.7–5.3)
Sodium: 139 mEq/L (ref 137–147)
Total Bilirubin: 0.5 mg/dL (ref 0.3–1.2)
Total Protein: 7.2 g/dL (ref 6.0–8.3)

## 2014-05-13 LAB — CBC WITH DIFFERENTIAL/PLATELET
BASOS ABS: 0 10*3/uL (ref 0.0–0.1)
BASOS PCT: 0 % (ref 0–1)
EOS PCT: 0 % (ref 0–5)
Eosinophils Absolute: 0 10*3/uL (ref 0.0–0.7)
HEMATOCRIT: 40.9 % (ref 39.0–52.0)
Hemoglobin: 14.1 g/dL (ref 13.0–17.0)
Lymphocytes Relative: 20 % (ref 12–46)
Lymphs Abs: 2.1 10*3/uL (ref 0.7–4.0)
MCH: 30.6 pg (ref 26.0–34.0)
MCHC: 34.5 g/dL (ref 30.0–36.0)
MCV: 88.7 fL (ref 78.0–100.0)
MONO ABS: 0.8 10*3/uL (ref 0.1–1.0)
Monocytes Relative: 7 % (ref 3–12)
Neutro Abs: 7.5 10*3/uL (ref 1.7–7.7)
Neutrophils Relative %: 73 % (ref 43–77)
Platelets: 239 10*3/uL (ref 150–400)
RBC: 4.61 MIL/uL (ref 4.22–5.81)
RDW: 12.6 % (ref 11.5–15.5)
WBC: 10.4 10*3/uL (ref 4.0–10.5)

## 2014-05-13 LAB — URINE MICROSCOPIC-ADD ON

## 2014-05-13 LAB — LIPASE, BLOOD: LIPASE: 104 U/L — AB (ref 11–59)

## 2014-05-13 MED ORDER — SODIUM CHLORIDE 0.9 % IV BOLUS (SEPSIS)
1000.0000 mL | Freq: Once | INTRAVENOUS | Status: AC
  Administered 2014-05-14: 1000 mL via INTRAVENOUS

## 2014-05-13 MED ORDER — MORPHINE SULFATE 4 MG/ML IJ SOLN
4.0000 mg | Freq: Once | INTRAMUSCULAR | Status: AC
  Administered 2014-05-14: 4 mg via INTRAVENOUS
  Filled 2014-05-13: qty 1

## 2014-05-13 MED ORDER — IOHEXOL 300 MG/ML  SOLN: 25.0000 mL | INTRAMUSCULAR | Status: AC

## 2014-05-13 MED ORDER — ONDANSETRON HCL 4 MG/2ML IJ SOLN
4.0000 mg | Freq: Once | INTRAMUSCULAR | Status: AC
  Administered 2014-05-14: 4 mg via INTRAVENOUS
  Filled 2014-05-13: qty 2

## 2014-05-13 NOTE — ED Provider Notes (Signed)
CSN: [id number]     Arrival date & time 05/13/14  1820 History   None    Chief Complaint  Patient presents with  . Abdominal Pain   HPI  History provided by the patient. The patient is a 53 year old male with history of COPD, GERD and hypertension who presents with complaints of left abdominal pain. Pain started today around 5 PM. It is associated with some nausea. Patient is also noticed some dark black soft or diarrhea-type stools. Denies seeing any blood or mucus. Denies having similar symptoms previously. denies any vomiting. No fever, chills or sweats. Pain does radiate around to the back slightly. No other aggravating or alleviating factors. No other associated symptoms.     Past Medical History  Diagnosis Date  . Asthma     Last asthma attack at age 30; History of trach at 16 months  . COPD (chronic obstructive pulmonary disease) 06/2012    Dx by Zoe Lan, [facility]   Past Surgical History  Procedure Laterality Date  . Tracheostomy closure    . Tracheostomy    . Fracture surgery      NO HX of Broken Bone.  Has rotator cuff tear repair and shoulder/clavicle reconstruction  . Joint replacement      NO HX of JOINT REPLACEMENT.  Rotator cuff tear repair and shoulder reconstruction   Family History  Problem Relation Age of Onset  . Heart disease Mother     s/p 3V CABG  . Cancer Father 49    lung cancer; +tobacco  . Cancer Maternal Grandmother   . Heart disease Maternal Grandmother   . Cancer Paternal Grandmother   . Lupus Sister   . Multiple sclerosis Sister   . Anemia Daughter    History  Substance Use Topics  . Smoking status: Current Every Day Smoker -- 1.00 packs/day    Types: Cigarettes    Start date: 01/15/1975  . Smokeless tobacco: Never Used     Comment: down from 2 ppd; has used an e-cigarette  . Alcohol Use: 1.8 oz/week    3 Cans of beer per week    Review of Systems  Constitutional: Negative for fever, chills and diaphoresis.    Respiratory: Negative for cough and shortness of breath.   Gastrointestinal: Positive for abdominal pain and diarrhea. Negative for nausea, vomiting and constipation.  All other systems reviewed and are negative.     Allergies  Tylox  Home Medications   Prior to Admission medications   Medication Sig Start Date End Date Taking? Authorizing Provider  esomeprazole (NEXIUM) 40 MG capsule Take 40 mg by mouth daily as needed. For heartburn   Yes Historical Provider, MD  ramipril (ALTACE) 2.5 MG capsule Take 2.5 mg by mouth daily.   Yes Historical Provider, MD   BP 115/58  Pulse 60  Temp(Src) 98.4 F (36.9 C) (Oral)  Resp 18  Ht 5\' 8"  (1.727 m)  Wt 248 lb (112.492 kg)  BMI 37.72 kg/m2  SpO2 95% Physical Exam  Nursing note and vitals reviewed. Constitutional: He is oriented to person, place, and time. He appears well-developed and well-nourished. No distress.  HENT:  Head: Normocephalic.  Cardiovascular: Normal rate and regular rhythm.   Pulmonary/Chest: Effort normal and breath sounds normal. No respiratory distress. He has no wheezes. He has no rales.  Abdominal: Soft. He exhibits no distension. There is tenderness in the left upper quadrant and left lower quadrant. There is no rebound.    Neurological: He  is alert and oriented to person, place, and time. Gait normal.  Skin: Skin is warm.  Psychiatric: He has a normal mood and affect. His behavior is normal.    ED Course  Procedures   COORDINATION OF CARE:  Nursing notes reviewed. Vital signs reviewed. Initial pt interview and examination performed.   Filed Vitals:   05/13/14 1841 05/13/14 2122 05/13/14 2223  BP: 118/71 124/70 115/58  Pulse: 87  60  Temp: 98.4 F (36.9 C)    TempSrc: Oral    Resp: 18 18   Height: 5\' 8"  (1.727 m)    Weight: 248 lb (112.492 kg)    SpO2: 93% 99% 95%    11:12 PM-patient seen and evaluated. Patient well-appearing no acute distress.   Labs do show a small amount of protein in  the urine. Also elevated lipase. No other abnormalities of LFTs. Normal WBC.  CT scan unremarkable. Normal appendix, no diverticulitis, no signs of pancreatitis, no kidney stones. No other concerning findings to explain patient's symptoms. He is improved after medications. At this time advised him to have a recheck of symptoms in the next one to 2 days. Also advised patient to followup with a GI specialist for routine colonoscopy since he is over the age of 53 and has family history of lung cancer. He agrees with plan.   Treatment plan initiated: Medications  iohexol (OMNIPAQUE) 300 MG/ML solution 25 mL (not administered)  morphine 4 MG/ML injection 4 mg (4 mg Intravenous Given 05/14/14 0038)  ondansetron (ZOFRAN) injection 4 mg (4 mg Intravenous Given 05/14/14 0038)  sodium chloride 0.9 % bolus 1,000 mL (0 mLs Intravenous Stopped 05/14/14 0205)  iohexol (OMNIPAQUE) 300 MG/ML solution 100 mL (100 mLs Intravenous Contrast Given 05/14/14 0047)     Results for orders placed during the hospital encounter of 05/13/14  CBC WITH DIFFERENTIAL      Result Value Ref Range   WBC 10.4  4.0 - 10.5 K/uL   RBC 4.61  4.22 - 5.81 MIL/uL   Hemoglobin 14.1  13.0 - 17.0 g/dL   HCT 16.140.9  09.639.0 - 04.552.0 %   MCV 88.7  78.0 - 100.0 fL   MCH 30.6  26.0 - 34.0 pg   MCHC 34.5  30.0 - 36.0 g/dL   RDW 40.912.6  81.111.5 - 91.415.5 %   Platelets 239  150 - 400 K/uL   Neutrophils Relative % 73  43 - 77 %   Neutro Abs 7.5  1.7 - 7.7 K/uL   Lymphocytes Relative 20  12 - 46 %   Lymphs Abs 2.1  0.7 - 4.0 K/uL   Monocytes Relative 7  3 - 12 %   Monocytes Absolute 0.8  0.1 - 1.0 K/uL   Eosinophils Relative 0  0 - 5 %   Eosinophils Absolute 0.0  0.0 - 0.7 K/uL   Basophils Relative 0  0 - 1 %   Basophils Absolute 0.0  0.0 - 0.1 K/uL  COMPREHENSIVE METABOLIC PANEL      Result Value Ref Range   Sodium 139  137 - 147 mEq/L   Potassium 4.1  3.7 - 5.3 mEq/L   Chloride 100  96 - 112 mEq/L   CO2 28  19 - 32 mEq/L   Glucose, Bld 81  70 - 99  mg/dL   BUN 12  6 - 23 mg/dL   Creatinine, Ser 7.821.20  0.50 - 1.35 mg/dL   Calcium 9.1  8.4 - 95.610.5 mg/dL   Total Protein  7.2  6.0 - 8.3 g/dL   Albumin 4.1  3.5 - 5.2 g/dL   AST 19  0 - 37 U/L   ALT 19  0 - 53 U/L   Alkaline Phosphatase 65  39 - 117 U/L   Total Bilirubin 0.5  0.3 - 1.2 mg/dL   GFR calc non Af Amer 68 (*) >90 mL/min   GFR calc Af Amer 79 (*) >90 mL/min   Anion gap 11  5 - 15  URINALYSIS, ROUTINE W REFLEX MICROSCOPIC      Result Value Ref Range   Color, Urine YELLOW  YELLOW [phone](4Catha NottiAdvanced Micro DevicesouseLEAR   Specific Gravity, Urine 1.022(680)Catha NottiAdv<MEASUREME LOWER CHEST: Unremarkable.  ABDOMEN/PELVIS:  Liver: No focal abnormality.  Biliary: No evidence of biliary obstruction or stone.  Pancreas: Unremarkable.  Spleen: Unremarkable.  Adrenals: Unremarkable.  Kidneys and ureters: Horseshoe kidney. No hydronephrosis, nephrolithiasis, or renal mass.  Bladder: Unremarkable.  Reproductive: Unremarkable for age.  Bowel: No obstruction. Normal appendix.   Retroperitoneum: No mass or adenopathy.  Peritoneum: No ascites or pneumoperitoneum.  Vascular: Aortic and branch vessel atherosclerosis. No acute vascular findings.  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  IMPRESSION: 1. No acute intra-abdominal findings.  No evidence of pancreatitis. 2. Horseshoe kidney.   Electronically Signed   By: Jonathan  Watts M.D.   On: 05/14/2014 01:24     MDM   Final diagnoses:  Left upper quadrant pain       Burris Matherne S Khair Chasteen, PA-C 05/14/14 0600

## 2014-05-13 NOTE — ED Notes (Signed)
The pt has had lt lateral abd pain since 1600 today with nausea and dark diarrhea stools no previous history

## 2014-05-14 ENCOUNTER — Encounter (HOSPITAL_COMMUNITY): Payer: Self-pay | Admitting: *Deleted

## 2014-05-14 ENCOUNTER — Emergency Department (HOSPITAL_COMMUNITY): Payer: BC Managed Care – PPO

## 2014-05-14 MED ORDER — CYCLOBENZAPRINE HCL 10 MG PO TABS: 10.0000 mg | ORAL_TABLET | Freq: Three times a day (TID) | ORAL | Status: DC | PRN

## 2014-05-14 MED ORDER — IOHEXOL 300 MG/ML  SOLN
100.0000 mL | Freq: Once | INTRAMUSCULAR | Status: AC | PRN
  Administered 2014-05-14: 100 mL via INTRAVENOUS

## 2014-05-14 MED ORDER — HYDROCODONE-ACETAMINOPHEN 5-325 MG PO TABS: 1.0000 | ORAL_TABLET | ORAL | Status: DC | PRN

## 2014-05-14 NOTE — Discharge Instructions (Signed)
Your labratory testing and CAT scan have not shown any signs for a concerning or emergent cause for your symptoms today. It is recommended that you have a recheck of your symptoms in the next 1-2 days by your doctor.  Return sooner if you have any worsening symptoms.   Abdominal Pain Many things can cause abdominal pain. Usually, abdominal pain is not caused by a disease and will improve without treatment. It can often be observed and treated at home. Your health care provider will do a physical exam and possibly order blood tests and X-rays to help determine the seriousness of your pain. However, in many cases, more time must pass before a clear cause of the pain can be found. Before that point, your health care provider may not know if you need more testing or further treatment. HOME CARE INSTRUCTIONS  Monitor your abdominal pain for any changes. The following actions may help to alleviate any discomfort you are experiencing:  Only take over-the-counter or prescription medicines as directed by your health care provider.  Do not take laxatives unless directed to do so by your health care provider.  Try a clear liquid diet (broth, tea, or water) as directed by your health care provider. Slowly move to a bland diet as tolerated. SEEK MEDICAL CARE IF:  You have unexplained abdominal pain.  You have abdominal pain associated with nausea or diarrhea.  You have pain when you urinate or have a bowel movement.  You experience abdominal pain that wakes you in the night.  You have abdominal pain that is worsened or improved by eating food.  You have abdominal pain that is worsened with eating fatty foods.  You have a fever. SEEK IMMEDIATE MEDICAL CARE IF:   Your pain does not go away within 2 hours.  You keep throwing up (vomiting).  Your pain is felt only in portions of the abdomen, such as the right side or the left lower portion of the abdomen.  You pass bloody or black tarry  stools. MAKE SURE YOU:  Understand these instructions.   Will watch your condition.   Will get help right away if you are not doing well or get worse.  Document Released: 07/08/2005 Document Revised: 10/03/2013 Document Reviewed: 06/07/2013 [facility] Patient Information 2015 [employer], [place]. This information is not intended to replace advice given to you by your health care provider. Make sure you discuss any questions you have with your health care provider.

## 2014-05-14 NOTE — ED Provider Notes (Signed)
Medical screening examination/treatment/procedure(s) were performed by non-physician practitioner and as supervising physician I was immediately available for consultation/collaboration.   EKG Interpretation None       Olivia Mackielga M Aydenn Gervin, MD 05/14/14 [phone]

## 2014-05-16 ENCOUNTER — Ambulatory Visit (INDEPENDENT_AMBULATORY_CARE_PROVIDER_SITE_OTHER): Payer: BLUE CROSS/BLUE SHIELD | Admitting: Podiatry

## 2014-05-16 ENCOUNTER — Encounter: Payer: Self-pay | Admitting: Podiatry

## 2014-05-16 VITALS — BP 125/84 | HR 85 | Resp 17

## 2014-05-16 DIAGNOSIS — L600 Ingrowing nail: Secondary | ICD-10-CM | POA: Diagnosis not present

## 2014-05-16 NOTE — Progress Notes (Signed)
Subjective:     Patient ID: [name], male   DOB: 10/21/1960, 53 y.o.   MRN: [id number]  HPI patient presents with a painful right hallux nail medial border that has occurred since he lost a damaged nail 2 years ago. States he's tried different treatments and trimming without getting it better   Review of Systems  All other systems reviewed and are negative.      Objective:   Physical Exam  Nursing note and vitals reviewed. Constitutional: He is oriented to person, place, and time.  Cardiovascular: Intact distal pulses.   Musculoskeletal: Normal range of motion.  Neurological: He is oriented to person, place, and time.  Skin: Skin is warm.   neurovascular status intact with muscle strength adequate and range of motion subtalar midtarsal joint within normal limits. Patient's has good digital perfusion and is noted to have normal arch height and I found right hallux medial border is quite incurvated and tender when pressed     Assessment:     Chronic ingrown toenail right hallux nail    Plan:     H&P performed and condition reviewed discussed. I've recommended correction and I explained the procedure in order to fix this and risk associated with this. Patient wants surgery and today I infiltrated 60 mg Xylocaine Marcaine mixture remove the medial border exposed matrix and applied phenol 3 applications 30 seconds followed by alcohol lavaged and sterile dressing area gave instructions on soaks and reappoint

## 2014-05-16 NOTE — Progress Notes (Signed)
   Subjective:    Patient ID: [name], male    DOB: 10/14/1960, 53 y.o.   MRN: [id number]  HPI  Pt presents with right great toe nail red and swollen and ingrown  Review of Systems     Objective:   Physical Exam        Assessment & Plan:

## 2014-05-16 NOTE — Patient Instructions (Signed)

## 2014-07-03 ENCOUNTER — Inpatient Hospital Stay (HOSPITAL_COMMUNITY)
Admission: EM | Admit: 2014-07-03 | Discharge: 2014-07-06 | DRG: 066 | Disposition: A | Discharge status: Home Health | Payer: BC Managed Care – PPO | Attending: Internal Medicine | Admitting: Internal Medicine

## 2014-07-03 ENCOUNTER — Emergency Department (HOSPITAL_COMMUNITY): Payer: BC Managed Care – PPO

## 2014-07-03 ENCOUNTER — Encounter (HOSPITAL_COMMUNITY): Payer: Self-pay | Admitting: Emergency Medicine

## 2014-07-03 DIAGNOSIS — Z23 Encounter for immunization: Secondary | ICD-10-CM

## 2014-07-03 DIAGNOSIS — F17200 Nicotine dependence, unspecified, uncomplicated: Secondary | ICD-10-CM | POA: Diagnosis present

## 2014-07-03 DIAGNOSIS — I6529 Occlusion and stenosis of unspecified carotid artery: Secondary | ICD-10-CM | POA: Diagnosis not present

## 2014-07-03 DIAGNOSIS — Z823 Family history of stroke: Secondary | ICD-10-CM | POA: Diagnosis not present

## 2014-07-03 DIAGNOSIS — Z6837 Body mass index (BMI) 37.0-37.9, adult: Secondary | ICD-10-CM

## 2014-07-03 DIAGNOSIS — E1159 Type 2 diabetes mellitus with other circulatory complications: Secondary | ICD-10-CM | POA: Diagnosis not present

## 2014-07-03 DIAGNOSIS — I6350 Cerebral infarction due to unspecified occlusion or stenosis of unspecified cerebral artery: Secondary | ICD-10-CM | POA: Diagnosis present

## 2014-07-03 DIAGNOSIS — E785 Hyperlipidemia, unspecified: Secondary | ICD-10-CM | POA: Diagnosis present

## 2014-07-03 DIAGNOSIS — J449 Chronic obstructive pulmonary disease, unspecified: Secondary | ICD-10-CM | POA: Diagnosis present

## 2014-07-03 DIAGNOSIS — Z79899 Other long term (current) drug therapy: Secondary | ICD-10-CM | POA: Diagnosis not present

## 2014-07-03 DIAGNOSIS — K219 Gastro-esophageal reflux disease without esophagitis: Secondary | ICD-10-CM | POA: Diagnosis present

## 2014-07-03 DIAGNOSIS — R1310 Dysphagia, unspecified: Secondary | ICD-10-CM | POA: Diagnosis present

## 2014-07-03 DIAGNOSIS — J438 Other emphysema: Secondary | ICD-10-CM

## 2014-07-03 DIAGNOSIS — J42 Unspecified chronic bronchitis: Secondary | ICD-10-CM

## 2014-07-03 DIAGNOSIS — I10 Essential (primary) hypertension: Secondary | ICD-10-CM | POA: Diagnosis present

## 2014-07-03 DIAGNOSIS — I517 Cardiomegaly: Secondary | ICD-10-CM | POA: Diagnosis not present

## 2014-07-03 DIAGNOSIS — E1169 Type 2 diabetes mellitus with other specified complication: Secondary | ICD-10-CM | POA: Diagnosis present

## 2014-07-03 DIAGNOSIS — I498 Other specified cardiac arrhythmias: Secondary | ICD-10-CM | POA: Diagnosis present

## 2014-07-03 DIAGNOSIS — I639 Cerebral infarction, unspecified: Secondary | ICD-10-CM | POA: Diagnosis present

## 2014-07-03 DIAGNOSIS — R5381 Other malaise: Secondary | ICD-10-CM | POA: Diagnosis present

## 2014-07-03 DIAGNOSIS — I671 Cerebral aneurysm, nonruptured: Secondary | ICD-10-CM | POA: Diagnosis present

## 2014-07-03 DIAGNOSIS — J4489 Other specified chronic obstructive pulmonary disease: Secondary | ICD-10-CM | POA: Diagnosis present

## 2014-07-03 DIAGNOSIS — R209 Unspecified disturbances of skin sensation: Secondary | ICD-10-CM | POA: Diagnosis present

## 2014-07-03 DIAGNOSIS — R5383 Other fatigue: Secondary | ICD-10-CM

## 2014-07-03 DIAGNOSIS — R4789 Other speech disturbances: Secondary | ICD-10-CM | POA: Diagnosis present

## 2014-07-03 LAB — DIFFERENTIAL
BASOS PCT: 0 % (ref 0–1)
Basophils Absolute: 0 10*3/uL (ref 0.0–0.1)
EOS PCT: 2 % (ref 0–5)
Eosinophils Absolute: 0.2 10*3/uL (ref 0.0–0.7)
LYMPHS ABS: 2.5 10*3/uL (ref 0.7–4.0)
Lymphocytes Relative: 28 % (ref 12–46)
Monocytes Absolute: 0.6 10*3/uL (ref 0.1–1.0)
Monocytes Relative: 7 % (ref 3–12)
Neutro Abs: 5.6 10*3/uL (ref 1.7–7.7)
Neutrophils Relative %: 63 % (ref 43–77)

## 2014-07-03 LAB — I-STAT TROPONIN, ED: Troponin i, poc: 0 ng/mL (ref 0.00–0.08)

## 2014-07-03 LAB — COMPREHENSIVE METABOLIC PANEL
ALBUMIN: 3.3 g/dL — AB (ref 3.5–5.2)
ALT: 18 U/L (ref 0–53)
ANION GAP: 12 (ref 5–15)
AST: 16 U/L (ref 0–37)
Alkaline Phosphatase: 60 U/L (ref 39–117)
BUN: 12 mg/dL (ref 6–23)
CO2: 27 mEq/L (ref 19–32)
Calcium: 9.3 mg/dL (ref 8.4–10.5)
Chloride: 100 mEq/L (ref 96–112)
Creatinine, Ser: 0.9 mg/dL (ref 0.50–1.35)
GFR calc non Af Amer: >90 mL/min (ref >90)
GLUCOSE: 144 mg/dL — AB (ref 70–99)
Potassium: 3.9 mEq/L (ref 3.7–5.3)
Sodium: 139 mEq/L (ref 137–147)
TOTAL PROTEIN: 6.5 g/dL (ref 6.0–8.3)
Total Bilirubin: <0.2 mg/dL — ABNORMAL LOW (ref 0.3–1.2)

## 2014-07-03 LAB — I-STAT CHEM 8, ED
BUN: 11 mg/dL (ref 6–23)
Calcium, Ion: 1.13 mmol/L (ref 1.12–1.23)
Chloride: 102 mEq/L (ref 96–112)
Creatinine, Ser: 1 mg/dL (ref 0.50–1.35)
Glucose, Bld: 144 mg/dL — ABNORMAL HIGH (ref 70–99)
HEMATOCRIT: 40 % (ref 39.0–52.0)
Hemoglobin: 13.6 g/dL (ref 13.0–17.0)
Potassium: 3.7 mEq/L (ref 3.7–5.3)
SODIUM: 139 meq/L (ref 137–147)
TCO2: 26 mmol/L (ref 0–100)

## 2014-07-03 LAB — CBC
HCT: 39.2 % (ref 39.0–52.0)
Hemoglobin: 13.4 g/dL (ref 13.0–17.0)
MCH: 30.3 pg (ref 26.0–34.0)
MCHC: 34.2 g/dL (ref 30.0–36.0)
MCV: 88.7 fL (ref 78.0–100.0)
PLATELETS: 195 10*3/uL (ref 150–400)
RBC: 4.42 MIL/uL (ref 4.22–5.81)
RDW: 12.6 % (ref 11.5–15.5)
WBC: 9 10*3/uL (ref 4.0–10.5)

## 2014-07-03 LAB — PROTIME-INR
INR: 0.89 (ref 0.00–1.49)
PROTHROMBIN TIME: 12.1 s (ref 11.6–15.2)

## 2014-07-03 LAB — APTT: aPTT: 30 seconds (ref 24–37)

## 2014-07-03 LAB — CBG MONITORING, ED: Glucose-Capillary: 154 mg/dL — ABNORMAL HIGH (ref 70–99)

## 2014-07-03 MED ORDER — ACETAMINOPHEN 500 MG PO TABS
1000.0000 mg | ORAL_TABLET | Freq: Once | ORAL | Status: AC
  Administered 2014-07-03: 1000 mg via ORAL
  Filled 2014-07-03: qty 2

## 2014-07-03 MED ORDER — ASPIRIN 300 MG RE SUPP: 300.0000 mg | Freq: Once | RECTAL | Status: DC

## 2014-07-03 MED ORDER — ASPIRIN EC 325 MG PO TBEC
325.0000 mg | DELAYED_RELEASE_TABLET | Freq: Once | ORAL | Status: AC
  Administered 2014-07-03: 325 mg via ORAL
  Filled 2014-07-03: qty 1

## 2014-07-03 NOTE — ED Notes (Signed)
Dr. Kirkpatrick at bedside 

## 2014-07-03 NOTE — Consult Note (Addendum)
Neurology Consultation Reason for Consult: Dysarthria Referring Physician: Judd Lien, D  CC: Dysarthria  History is obtained from: Patient  HPI: [name] is a 53 y.o. male with a history of "leaky valve" seen by Dr. Sharyn Lull presents with weakness, lightheadedness, left-sided numbness and slurred speech. His symptoms are rapidly improving, but he continues to have left arm numbness as well as left facial droop and slurred speech.   He states that the symptoms started abruptly while he was sitting on the couch. He has never had anything like this happen before.  LKW: 9:15 PM tpa given?: no, mild symptoms    ROS: A 14 point ROS was performed and is negative except as noted in the HPI.   Past Medical History  Diagnosis Date  . Asthma     Last asthma attack at age 55; History of trach at 16 months  . COPD (chronic obstructive pulmonary disease) 06/2012    Dx by Zoe Lan, [facility]    Family History:  father-stroke   Social History: Tob:  quit 3 weeks ago   Exam: Current vital signs: BP 153/80  Pulse 64  Temp(Src) 98 F (36.7 C) (Oral)  Resp 16  Ht  (1.727 m)  Wt 111.131 kg (245 lb)  BMI 37.26 kg/m2  SpO2 98% Vital signs in last 24 hours: Temp:  [98 F (36.7 C)-98.1 F (36.7 C)] 98 F (36.7 C) (09/22 2223) Pulse Rate:  [64-83] 64 (09/22 2215) Resp:  [16-19] 16 (09/22 2215) BP: (147-159)/(76-82) 153/80 mmHg (09/22 2215) SpO2:  [97 %-98 %] 98 % (09/22 2215) Weight:  [111.131 kg (245 lb)] 111.131 kg (245 lb) (09/22 2141)  General: in bed, NAD CV: RRR Mental Status: Patient is awake, alert, oriented to person, place, month, year, and situation. Immediate and remote memory are intact. Patient is able to give a clear and coherent history. No signs of aphasia or neglect Mild dysarthria Cranial Nerves: II: Visual Fields are full. Pupils are equal, round, and reactive to light.  Discs are difficult to visualize. III,IV, VI: EOMI without  ptosis or diploplia.  V: Facial sensation is symmetric to temperature VII: Facial movement is notable for very mild left facial weakness VIII: hearing is intact to voice X: Uvula elevates symmetrically XI: Shoulder shrug is symmetric. XII: tongue is midline without atrophy or fasciculations.  Motor: Tone is normal. Bulk is normal. 5/5 strength was present in all four extremities.  Sensory: Sensation is diminished in the left arm Deep Tendon Reflexes: 2+ and symmetric in the biceps and patellae.  Plantars: Toes are downgoing bilaterally.  Cerebellar: FNF and HKS are intact bilaterally Gait: Patient has a stable casual gait.     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 Given his improvement, as well as his mild nature of residual symptoms I would not favor pursuing TPA at this time. He will need to be admitted for stroke risk factor evaluation and PT/OT evaluation  Recommendations: 1. HgbA1c, fasting lipid panel 2. MRI, MRA  of the brain without contrast 3. Frequent neuro checks 4. Echocardiogram 5. Carotid dopplers 6. Prophylactic therapy-Antiplatelet med: Aspirin - dose  PO or  PR 7. Risk factor modification 8. Telemetry monitoring 9. PT consult, OT consult, Speech consult 10. He was counseled to remain a nonsmoker   Ritta Slot,  MD [facility] [phone]  If 7pm- 7am, please page neurology on call as listed in [place].

## 2014-07-03 NOTE — Progress Notes (Signed)
53 yo male CODE STROKE. While watching television at 2115 Pt noticed weakness/light headedness and numbness in left arm, slurred speech. Pt history includes "leaky valve" per patient, asthma, and copd, recently quit smoking. Currently not on blood thinners. Upon assessment pt has a mild slurred speech, left facial droop, and admits to some sensory deficit on left arm that is improving. NIHSS done yielding 3. CBG 154. Pt able to ambulate well in room with assist. Denies dizziness but complains of severe central headache. CT done, negative. Pt  symptoms are mild and resolving. Will remain in ED until out of TPA window per Dr. Amada Jupiter, for close monitoring.

## 2014-07-03 NOTE — ED Notes (Signed)
Pt. Reports he was sitting down watching TV and noticed slurred speech, weakness to bilateral arms/legs (states he was unable to grip a cup), and numbness to bilateral extremities.

## 2014-07-03 NOTE — ED Notes (Signed)
Pt. reports left facial droop , expressive aphasia and left arm weakness onset this evening 9 pm , slurred speech at arrival with weak left grip/left arm drift and mild diaphoresis . Seen by EDP ( Dr. Judd Lien ) at triage - Code Stroke initiated.

## 2014-07-03 NOTE — ED Provider Notes (Signed)
CSN: [id number]     Arrival date & time 07/03/14  2135 History   First MD Initiated Contact with Patient 07/03/14 2146     Chief Complaint  Patient presents with  . Facial Droop    An emergency department physician performed an initial assessment on this suspected stroke patient at 2139. (Consider location/radiation/quality/duration/timing/severity/associated sxs/prior Treatment) HPI Comments: Patient is a 53 year old male with history of COPD and reflux. He presents for complaints of weakness, slurred speech, and facial droop. He felt as if he is having difficulty walking and keeping his balance. He is having difficulty getting his words formed. This started suddenly at approximately 9:15 this evening. He has never experienced anything like this before.  Patient is a 54 y.o. male presenting with weakness. The history is provided by the patient.  Weakness The current episode started less than 1 hour ago. The problem occurs constantly. The problem has not changed since onset.Pertinent negatives include no chest pain. Nothing aggravates the symptoms. Nothing relieves the symptoms. He has tried nothing for the symptoms. The treatment provided no relief.    Past Medical History  Diagnosis Date  . Asthma     Last asthma attack at age 58; History of trach at 16 months  . COPD (chronic obstructive pulmonary disease) 06/2012    Dx by Eldridge Abrahams, [facility] at [facility]   Past Surgical History  Procedure Laterality Date  . Tracheostomy closure    . Tracheostomy    . Fracture surgery      NO HX of Broken Bone.  Has rotator cuff tear repair and shoulder/clavicle reconstruction  . Joint replacement      NO HX of JOINT REPLACEMENT.  Rotator cuff tear repair and shoulder reconstruction   Family History  Problem Relation Age of Onset  . Heart disease Mother     s/p 3V CABG  . Cancer Father 15    lung cancer; +tobacco  . Cancer Maternal Grandmother   . Heart disease Maternal  Grandmother   . Cancer Paternal Grandmother   . Lupus Sister   . Multiple sclerosis Sister   . Anemia Daughter    History  Substance Use Topics  . Smoking status: Current Every Day Smoker -- 1.00 packs/day    Types: Cigarettes    Start date: 01/15/1975  . Smokeless tobacco: Never Used     Comment: down from 2 ppd; has used an e-cigarette  . Alcohol Use: 1.8 oz/week    3 Cans of beer per week    Review of Systems  Cardiovascular: Negative for chest pain.  Neurological: Positive for weakness.  All other systems reviewed and are negative.     Allergies  Tylox  Home Medications   Prior to Admission medications   Medication Sig Start Date End Date Taking? Authorizing Provider  cyclobenzaprine (FLEXERIL) 10 MG tablet Take 1 tablet (10 mg total) by mouth 3 (three) times daily as needed for muscle spasms. 05/14/14   Ruthell Rummage Dammen, PA-C  esomeprazole (NEXIUM) 40 MG capsule Take 40 mg by mouth daily as needed. For heartburn    Historical Provider, MD  HYDROcodone-acetaminophen (NORCO/VICODIN) 5-325 MG per tablet Take 1-2 tablets by mouth every 4 (four) hours as needed for moderate pain. 05/14/14   Ruthell Rummage Dammen, PA-C  ramipril (ALTACE) 2.5 MG capsule Take 2.5 mg by mouth daily.    Historical Provider, MD   BP 153/80  Pulse 64  Temp(Src) 98.1 F (36.7 C) (Oral)  Resp 16  Ht 5'  8" (1.727 m)  Wt 245 lb (111.131 kg)  BMI 37.26 kg/m2  SpO2 98% Physical Exam  Nursing note and vitals reviewed. Constitutional: He is oriented to person, place, and time. He appears well-developed and well-nourished. No distress.  HENT:  Head: Normocephalic and atraumatic.  Mouth/Throat: Oropharynx is clear and moist.  Neck: Normal range of motion. Neck supple.  Cardiovascular: Normal rate, regular rhythm and normal heart sounds.   No murmur heard. Pulmonary/Chest: Effort normal and breath sounds normal. No respiratory distress. He has no wheezes.  Abdominal: Soft. Bowel sounds are normal. He  exhibits no distension. There is no tenderness.  Musculoskeletal: Normal range of motion. He exhibits no edema.  Neurological: He is alert and oriented to person, place, and time.  There is a left-sided facial droop noted. His speech is somewhat slurred but comprehensible. Strength is 5 out of 5 in all extremities. Cranial nerves are otherwise intact.  Skin: Skin is warm and dry. He is not diaphoretic.    ED Course  Procedures (including critical care time) Labs Review Labs Reviewed  CBG MONITORING, ED - Abnormal; Notable for the following:    Glucose-Capillary 154 (*)    All other components within normal limits  I-STAT CHEM 8, ED - Abnormal; Notable for the following:    Glucose, Bld 144 (*)    All other components within normal limits  CBC  DIFFERENTIAL  PROTIME-INR  APTT  COMPREHENSIVE METABOLIC PANEL  CBG MONITORING, ED  I-STAT TROPOININ, ED    Imaging Review Ct Head (brain) Wo Contrast  07/03/2014   CLINICAL DATA:  Left facial droop  EXAM: CT HEAD WITHOUT CONTRAST  TECHNIQUE: Contiguous axial images were obtained from the base of the skull through the vertex without intravenous contrast.  COMPARISON:  None.  FINDINGS: There is no evidence of mass effect, midline shift or extra-axial fluid collections. There is no evidence of a space-occupying lesion or intracranial hemorrhage. There is no evidence of a cortical-based area of acute infarction.  The ventricles and sulci are appropriate for the patient's age. The basal cisterns are patent.  Visualized portions of the orbits are unremarkable. The visualized portions of the paranasal sinuses and mastoid air cells are unremarkable.  The osseous structures are unremarkable.  IMPRESSION: Normal CT of the brain without intravenous contrast.  These results were called by telephone at the time of interpretation on 07/03/2014 at 9:56 pm to Dr. Westley Gambles, who verbally acknowledged these results.   Electronically Signed   By: Kathreen Devoid    On: 07/03/2014 22:00     EKG Interpretation   Date/Time:  Tuesday July 03 2014 22:05:55 EDT Ventricular Rate:  66 PR Interval:  145 QRS Duration: 111 QT Interval:  424 QTC Calculation: 444 R Axis:   -21 Text Interpretation:  Sinus rhythm Borderline left axis deviation Abnormal  R-wave progression, early transition Confirmed by DELOS  MD, Magdiel Bartles  ([id number]) on 07/03/2014 10:24:31 PM      MDM   Final diagnoses:  None    Patient is a 53 year old male who presents with complaints of weakness, slurred speech, and left-sided facial droop that started approximately half an hour prior to presentation. I examined the patient at 9:45 PM in triage indeterminate he met the criteria for a code stroke. He was immediately sent to radiology for a CT scan of the head. This was unremarkable and laboratory studies are unremarkable as well.  He is evaluated by Dr. Leonel Ramsay from neurology. His assessment is as per his  note. He is recommending admission to the hospitalist service with further studies in the morning to further investigate the cause of what appears to be a CVA. Dr. Shanon Brow agrees to admit.  CRITICAL CARE Performed by: Veryl Speak Total critical care time: 30 minutes Critical care time was exclusive of separately billable procedures and treating other patients. Critical care was necessary to treat or prevent imminent or life-threatening deterioration. Critical care was time spent personally by me on the following activities: development of treatment plan with patient and/or surrogate as well as nursing, discussions with consultants, evaluation of patient's response to treatment, examination of patient, obtaining history from patient or surrogate, ordering and performing treatments and interventions, ordering and review of laboratory studies, ordering and review of radiographic studies, pulse oximetry and re-evaluation of patient's condition.     Veryl Speak, MD 07/03/14 2350

## 2014-07-04 ENCOUNTER — Inpatient Hospital Stay (HOSPITAL_COMMUNITY): Payer: BC Managed Care – PPO

## 2014-07-04 ENCOUNTER — Encounter (HOSPITAL_COMMUNITY): Payer: Self-pay | Admitting: *Deleted

## 2014-07-04 DIAGNOSIS — I6350 Cerebral infarction due to unspecified occlusion or stenosis of unspecified cerebral artery: Principal | ICD-10-CM

## 2014-07-04 DIAGNOSIS — I639 Cerebral infarction, unspecified: Secondary | ICD-10-CM | POA: Diagnosis present

## 2014-07-04 LAB — HEMOGLOBIN A1C
HEMOGLOBIN A1C: 6 % — AB (ref <5.7)
MEAN PLASMA GLUCOSE: 126 mg/dL — AB (ref <117)

## 2014-07-04 LAB — LIPID PANEL
Cholesterol: 165 mg/dL (ref 0–200)
HDL: 40 mg/dL (ref >39)
LDL Cholesterol: 116 mg/dL — ABNORMAL HIGH (ref 0–99)
Total CHOL/HDL Ratio: 4.1 RATIO
Triglycerides: 44 mg/dL (ref <150)
VLDL: 9 mg/dL (ref 0–40)

## 2014-07-04 MED ORDER — INFLUENZA VAC SPLIT QUAD 0.5 ML IM SUSY
0.5000 mL | PREFILLED_SYRINGE | INTRAMUSCULAR | Status: AC
  Administered 2014-07-05: 0.5 mL via INTRAMUSCULAR
  Filled 2014-07-04: qty 0.5

## 2014-07-04 MED ORDER — STROKE: EARLY STAGES OF RECOVERY BOOK
Freq: Once | Status: AC
  Administered 2014-07-05: 06:00:00
  Filled 2014-07-04 (×2): qty 1

## 2014-07-04 MED ORDER — STUDY - INVESTIGATIONAL DRUG SIMPLE RECORD
180.0000 mg | Freq: Once | Status: AC
  Administered 2014-07-04: 180 mg via ORAL
  Filled 2014-07-04: qty 180

## 2014-07-04 MED ORDER — ATORVASTATIN CALCIUM 20 MG PO TABS
20.0000 mg | ORAL_TABLET | Freq: Every day | ORAL | Status: DC
  Administered 2014-07-05: 20 mg via ORAL
  Filled 2014-07-04 (×3): qty 1

## 2014-07-04 MED ORDER — SIMVASTATIN 20 MG PO TABS: 20.0000 mg | ORAL_TABLET | Freq: Every day | ORAL | Status: DC

## 2014-07-04 MED ORDER — ALBUTEROL SULFATE (2.5 MG/3ML) 0.083% IN NEBU
2.5000 mg | INHALATION_SOLUTION | Freq: Four times a day (QID) | RESPIRATORY_TRACT | Status: DC
  Administered 2014-07-04 – 2014-07-06 (×6): 2.5 mg via RESPIRATORY_TRACT
  Filled 2014-07-04 (×6): qty 3

## 2014-07-04 MED ORDER — SODIUM CHLORIDE 0.9 % IJ SOLN: 3.0000 mL | INTRAMUSCULAR | Status: DC | PRN

## 2014-07-04 MED ORDER — METOCLOPRAMIDE HCL 5 MG/ML IJ SOLN
10.0000 mg | Freq: Once | INTRAMUSCULAR | Status: AC
  Administered 2014-07-04: 10 mg via INTRAVENOUS
  Filled 2014-07-04: qty 2

## 2014-07-04 MED ORDER — STUDY - INVESTIGATIONAL DRUG SIMPLE RECORD
90.0000 mg | Freq: Two times a day (BID) | Status: DC
  Administered 2014-07-05 – 2014-07-06 (×3): 90 mg via ORAL
  Filled 2014-07-04 (×6): qty 90

## 2014-07-04 MED ORDER — ASPIRIN 325 MG PO TABS
325.0000 mg | ORAL_TABLET | Freq: Every day | ORAL | Status: DC
  Filled 2014-07-04: qty 1

## 2014-07-04 MED ORDER — ASPIRIN 300 MG RE SUPP
300.0000 mg | Freq: Every day | RECTAL | Status: DC
  Administered 2014-07-04: 300 mg via RECTAL
  Filled 2014-07-04: qty 1

## 2014-07-04 MED ORDER — PNEUMOCOCCAL VAC POLYVALENT 25 MCG/0.5ML IJ INJ
0.5000 mL | INJECTION | INTRAMUSCULAR | Status: AC
  Administered 2014-07-05: 0.5 mL via INTRAMUSCULAR
  Filled 2014-07-04: qty 0.5

## 2014-07-04 MED ORDER — SODIUM CHLORIDE 0.9 % IV SOLN
250.0000 mL | INTRAVENOUS | Status: DC | PRN
Start: 2014-07-04 — End: 2014-07-06

## 2014-07-04 MED ORDER — ENOXAPARIN SODIUM 40 MG/0.4ML ~~LOC~~ SOLN
40.0000 mg | SUBCUTANEOUS | Status: DC
  Administered 2014-07-04: 40 mg via SUBCUTANEOUS
  Filled 2014-07-04: qty 0.4

## 2014-07-04 MED ORDER — SODIUM CHLORIDE 0.9 % IJ SOLN
3.0000 mL | Freq: Two times a day (BID) | INTRAMUSCULAR | Status: DC
  Administered 2014-07-04 – 2014-07-05 (×2): 3 mL via INTRAVENOUS

## 2014-07-04 MED ORDER — ALBUTEROL SULFATE (2.5 MG/3ML) 0.083% IN NEBU
2.5000 mg | INHALATION_SOLUTION | Freq: Four times a day (QID) | RESPIRATORY_TRACT | Status: DC
  Administered 2014-07-04: 2.5 mg via RESPIRATORY_TRACT
  Filled 2014-07-04: qty 3

## 2014-07-04 MED ORDER — SODIUM CHLORIDE 0.9 % IV BOLUS (SEPSIS)
1000.0000 mL | Freq: Once | INTRAVENOUS | Status: AC
  Administered 2014-07-04: 1000 mL via INTRAVENOUS

## 2014-07-04 MED ORDER — STUDY - INVESTIGATIONAL DRUG SIMPLE RECORD
100.0000 mg | Freq: Every day | Status: DC
  Administered 2014-07-05 – 2014-07-06 (×2): 100 mg via ORAL
  Filled 2014-07-04 (×3): qty 100

## 2014-07-04 MED ORDER — STUDY - INVESTIGATIONAL DRUG SIMPLE RECORD
300.0000 mg | Freq: Once | Status: AC
  Administered 2014-07-04: 300 mg via ORAL
  Filled 2014-07-04: qty 300

## 2014-07-04 NOTE — ED Notes (Addendum)
While ambulating, pt walk was unstable he seemed to trip over his feet when taking steps.

## 2014-07-04 NOTE — Progress Notes (Addendum)
[facility] Progress Note   [name] RUE:[id number] DOB: 01/18/1961 DOA: 07/03/2014 PCP: Aura Dials, MD  Brief narrative: [name] is a 53 y.o. male admitted with slurred speech, left facial droop found to have a right basal ganglia infarct on CT scan.    Subjective: Patient feels like he is feeling better, speech improved. Very concerned about not being allowed to eat. Noted to be wheezing- states this is worse today than yesterday.   Assessment/Plan: Principal Problem:   Acute CVA - Aspirin, Statin - give nutritional education - MRI, Doppler carotids and ECHO  Active Problems:  Asthma - start neb treatments PRN    GERD  - on Nexium at home- cont PPI  Glucose intolerance - HbA1c 6.0-  - advised pt to lose weight, dietary changes recommended- will need Metformin   Code Status: Full code Family Communication: family at bedside Disposition Plan: home DVT prophylaxis: Lovenox  Consultants: Neuro  Procedures: none  Antibiotics: Anti-infectives   None         Objective: Filed Weights   07/03/14 2141 07/04/14 0253  Weight: 111.131 kg (245 lb) 111.449 kg (245 lb 11.2 oz)    Intake/Output Summary (Last 24 hours) at 07/04/14 1312 Last data filed at 07/04/14 1051  Gross per 24 hour  Intake      3 ml  Output      0 ml  Net      3 ml     Vitals Filed Vitals:   07/04/14 0253 07/04/14 0800 07/04/14 1026 07/04/14 1158  BP: 114/68 99/69 111/66 114/63  Pulse: 70 55 62 53  Temp: 98 F (36.7 C)  98.1 F (36.7 C)   TempSrc: Oral  Oral   Resp: Height:  (1.727 m)     Weight: 111.449 kg (245 lb 11.2 oz)     SpO2: 96% 97% 97% 98%    Exam: General: No acute respiratory distress Lungs: Clear to auscultation bilaterally without wheezes or crackles Cardiovascular: Regular rate and rhythm without murmur gallop or rub normal S1 and S2 Abdomen: Nontender, nondistended, soft, bowel sounds positive, no rebound, no ascites, no  appreciable mass Extremities: No significant cyanosis, clubbing, or edema bilateral lower extremities Neuro: left facial droop, slurred speech, strength intact in extermities  Data Reviewed: Basic Metabolic Panel:  Recent Labs Lab 07/03/14 2139 07/03/14 2207  NA 139 139  K 3.9 3.7  CL 100 102  CO2 27  --   GLUCOSE 144* 144*  BUN 12 11  CREATININE 0.90 1.00  CALCIUM 9.3  --    Liver Function Tests:  Recent Labs Lab 07/03/14 2139  AST 16  ALT 18  ALKPHOS 60  BILITOT <0.2*  PROT 6.5  ALBUMIN 3.3*   No results found for this basename: LIPASE, AMYLASE,  in the last 168 hours No results found for this basename: AMMONIA,  in the last 168 hours CBC:  Recent Labs Lab 07/03/14 2139 07/03/14 2207  WBC 9.0  --   NEUTROABS 5.6  --   HGB 13.4 13.6  HCT 39.2 40.0  MCV 88.7  --   PLT 195  --    Cardiac Enzymes: No results found for this basename: CKTOTAL, CKMB, CKMBINDEX, TROPONINI,  in the last 168 hours BNP (last 3 results) No results found for this basename: PROBNP,  in the last 8760 hours CBG:  Recent Labs Lab 07/03/14 2143  GLUCAP 154*    No results found for this or any previous  visit (from the past 240 hour(s)).   Studies:  Recent x-ray studies have been reviewed in detail by the Attending Physician  Scheduled Meds:  Scheduled Meds: .  stroke: mapping our early stages of recovery book   Does not apply Once  . albuterol  2.5 mg Nebulization QID  . aspirin  300 mg Rectal Daily   Or  . aspirin  325 mg Oral Daily  . [START ON 07/05/2014] Influenza vac split quadrivalent PF  0.5 mL Intramuscular Tomorrow-1000  . [START ON 07/05/2014] pneumococcal 23 valent vaccine  0.5 mL Intramuscular Tomorrow-1000  . sodium chloride  3 mL Intravenous Q12H   Continuous Infusions:   Time spent on care of this patient: 35 min   Wilda Wetherell, MD 07/04/2014, 1:12 PM  LOS: 1 day   [facility] Office  [phone] Pager - Text Page per [email or web address]  If  7PM-7AM, please contact night-coverage [email or web address]

## 2014-07-04 NOTE — Progress Notes (Signed)
[facility] notified that pt has change in his NIH score from 3 to 8 new orders received. Ilean Skill LPN

## 2014-07-04 NOTE — Evaluation (Signed)
Clinical/Bedside Swallow Evaluation Patient Details  Name: [name] MRN: [id number] Date of Birth: [birth year]  Today's Date: 07/04/2014 Time: 4098-1191 SLP Time Calculation (min): 17 min  Past Medical History:  Past Medical History  Diagnosis Date  . Asthma     Last asthma attack at age 53; History of trach at 16 months  . COPD (chronic obstructive pulmonary disease) 06/2012    Dx by Zoe Lan, [facility] at [facility]   Past Surgical History:  Past Surgical History  Procedure Laterality Date  . Tracheostomy closure    . Tracheostomy    . Fracture surgery      NO HX of Broken Bone.  Has rotator cuff tear repair and shoulder/clavicle reconstruction  . Joint replacement      NO HX of JOINT REPLACEMENT.  Rotator cuff tear repair and shoulder reconstruction   HPI:  53 yo with PMH:  asthma, COPD admitted with sudden onset of slurred speech and left facial drooping.  CT Evolving ischemic right basal ganglia infarct.  No CXR.   Assessment / Plan / Recommendation Clinical Impression  Pt. required moderate verbal cues to not vocalize during swallow assessmemt.  Findings include decreased left labial ROM and strength resulting in min left buccal cavity residue with solid and labial residue.  No pharyngeal indications of poor tracheal protection although increased risk given acute CVA, impulsivity and decreased awareness.  Recommend initiate Dys 3 diet texture and thin liquids, pills in applesauce and straws ok.  ST will follow tomorrow for safety with po's and speech-language-cognitive assessment.     Aspiration Risk  Moderate    Diet Recommendation Dysphagia 3 (Mechanical Soft);Thin liquid   Liquid Administration via: Straw;Cup Medication Administration: Whole meds with puree Supervision: Patient able to self feed;Intermittent supervision to cue for compensatory strategies Compensations: Slow rate;Small sips/bites;Check for pocketing Postural Changes and/or Swallow  Maneuvers: Seated upright 90 degrees    Other  Recommendations Oral Care Recommendations: Oral care BID   Follow Up Recommendations  Inpatient Rehab    Frequency and Duration min 2x/week  2 weeks   Pertinent Vitals/Pain WNL         Swallow Study        Oral/Motor/Sensory Function Overall Oral Motor/Sensory Function: Impaired Labial ROM: Reduced left Labial Symmetry: Abnormal symmetry left Labial Strength: Reduced Labial Sensation: Reduced Lingual ROM: Within Functional Limits Lingual Symmetry: Within Functional Limits Lingual Strength: Reduced Facial ROM: Reduced left Facial Symmetry: Left droop Facial Strength: Reduced   Ice Chips Ice chips: Not tested   Thin Liquid Thin Liquid: Within functional limits Presentation: Cup;Straw    Nectar Thick Nectar Thick Liquid: Not tested   Honey Thick Honey Thick Liquid: Not tested   Puree Puree: Impaired Presentation: Spoon Oral Phase Functional Implications: Left anterior spillage Pharyngeal Phase Impairments:  (none)   Solid   GO    Solid: Impaired Presentation: Spoon Oral Phase Impairments: Reduced lingual movement/coordination Oral Phase Functional Implications: Left lateral sulci pocketing       Royce Macadamia 07/04/2014,3:06 PM  Breck Coons Lonell Face.Ed [employer] [phone]

## 2014-07-04 NOTE — Progress Notes (Signed)
STROKE TEAM PROGRESS NOTE   HISTORY [name] is a 53 y.o. male with a history of "leaky valve" seen by Dr. Sharyn Lull presents with weakness, lightheadedness, left-sided numbness and slurred speech. His symptoms are rapidly improving, but he continues to have left arm numbness as well as left facial droop and slurred speech. He states that the symptoms started abruptly while he was sitting on the couch 07/03/2014 at 0915p. He has never had anything like this happen before.  Patient was not administered TPA secondary to mild symptoms. He was admitted for further evaluation and treatment.   SUBJECTIVE (INTERVAL HISTORY) His mother is at the bedside.  Overall he feels his condition is gradually worsening. He was not considered to be a TPA candidate due to significant improvement upon arrival and was offered participation in the PRISMS  trial but refused. He subsequently got worse but it was too late for TPA and that time   OBJECTIVE Temp:  [98 F (36.7 C)-98.1 F (36.7 C)] 98.1 F (36.7 C) (09/23 1026) Pulse Rate:  [53-83] 53 (09/23 1158) Cardiac Rhythm:  [-] Sinus bradycardia (09/23 0900) Resp:  [16-24] 18 (09/23 1158) BP: (99-159)/(54-87) 114/63 mmHg (09/23 1158) SpO2:  [93 %-98 %] 98 % (09/23 1158) Weight:  [111.131 kg (245 lb)-111.449 kg (245 lb 11.2 oz)] 111.449 kg (245 lb 11.2 oz) (09/23 0253)   Recent Labs Lab 07/03/14 2143  GLUCAP 154*    Recent Labs Lab 07/03/14 2139 07/03/14 2207  NA 139 139  K 3.9 3.7  CL 100 102  CO2 27  --   GLUCOSE 144* 144*  BUN 12 11  CREATININE 0.90 1.00  CALCIUM 9.3  --     Recent Labs Lab 07/03/14 2139  AST 16  ALT 18  ALKPHOS 60  BILITOT <0.2*  PROT 6.5  ALBUMIN 3.3*    Recent Labs Lab 07/03/14 2139 07/03/14 2207  WBC 9.0  --   NEUTROABS 5.6  --   HGB 13.4 13.6  HCT 39.2 40.0  MCV 88.7  --   PLT 195  --    No results found for this basename: CKTOTAL, CKMB, CKMBINDEX, TROPONINI,  in the last 168 hours  Recent Labs   07/03/14 2139  LABPROT 12.1  INR 0.89   No results found for this basename: COLORURINE, APPERANCEUR, LABSPEC, PHURINE, GLUCOSEU, HGBUR, BILIRUBINUR, KETONESUR, PROTEINUR, UROBILINOGEN, NITRITE, LEUKOCYTESUR,  in the last 72 hours     Component Value Date/Time   CHOL 165 07/04/2014 0436   TRIG 44 07/04/2014 0436   HDL 40 07/04/2014 0436   CHOLHDL 4.1 07/04/2014 0436   VLDL 9 07/04/2014 0436   LDLCALC 116* 07/04/2014 0436   Lab Results  Component Value Date   HGBA1C 6.0* 07/04/2014   No results found for this basename: labopia,  cocainscrnur,  labbenz,  amphetmu,  thcu,  labbarb    No results found for this basename: ETH,  in the last 168 hours  Ct Head Wo Contrast  07/04/2014   CLINICAL DATA:  Left arm weakness  EXAM: CT HEAD WITHOUT CONTRAST  TECHNIQUE: Contiguous axial images were obtained from the base of the skull through the vertex without intravenous contrast.  COMPARISON:  Prior CT from 07/03/2014  FINDINGS: Focal hypodensity is now seen within the right basal ganglia involving the lentiform nuclei (series 3, image 17), compatible with acute ischemic infarct. This is not definitely seen on prior exam. No intracranial hemorrhage. No significant midline shift. There is no hydrocephalus. Basilar cisterns remain patent.  No extra-axial fluid collection. Ventricles are normal size without evidence of hydrocephalus.  Calvarium and scalp soft tissues within normal limits. No acute abnormality seen about the orbits.  Paranasal sinuses and mastoid air cells remain clear.  IMPRESSION: Evolving ischemic right basal ganglia infarct. No associated hemorrhage or significant mass effect.  Results were called by telephone at the time of interpretation on 07/04/2014 at 6:04 am to Dr. Ritta Slot , who verbally acknowledged these results.   Electronically Signed   By: Rise Mu M.D.   On: 07/04/2014 06:07   Ct Head (brain) Wo Contrast  07/03/2014   CLINICAL DATA:  Left facial droop  EXAM:  CT HEAD WITHOUT CONTRAST  TECHNIQUE: Contiguous axial images were obtained from the base of the skull through the vertex without intravenous contrast.  COMPARISON:  None.  FINDINGS: There is no evidence of mass effect, midline shift or extra-axial fluid collections. There is no evidence of a space-occupying lesion or intracranial hemorrhage. There is no evidence of a cortical-based area of acute infarction.  The ventricles and sulci are appropriate for the patient's age. The basal cisterns are patent.  Visualized portions of the orbits are unremarkable. The visualized portions of the paranasal sinuses and mastoid air cells are unremarkable.  The osseous structures are unremarkable.  IMPRESSION: Normal CT of the brain without intravenous contrast.  These results were called by telephone at the time of interpretation on 07/03/2014 at 9:56 pm to Dr. Lyndon Code, who verbally acknowledged these results.   Electronically Signed   By: Elige Ko   On: 07/03/2014 22:00     PHYSICAL EXAM Obese middle aged Caucasian male not in distress.Awake alert. Afebrile. Head is nontraumatic. Neck is supple without bruit. Hearing is normal. Cardiac exam no murmur or gallop. Lungs are clear to auscultation. Distal pulses are well felt. Neurological Exam : Awake alert oriented x 3 Dysarthric speech  But normal language. Mild left lower face asymmetry. Tongue midline. No drift. Mild diminished fine finger movements on left. Orbits right over left upper extremity. Mild left grip weak.. Normal sensation . Normal coordination. NIHSS 2  Premorbid MRS 0 ASSESSMENT/PLAN  [name] is a 53 y.o. male with history of a "leaky valve" presenting with left sided numbness, left facial weakness and slurred speech. He did not receive IV t-PA due to mild symptoms. CT imaging confirms a right basal ganglia infarct.    Stroke:  right basal ganglia infarct, thrombotic etiology to be determined mild transient eurologic worsening over  night  But this am better   no antithrobmotics prior to admission, now on aspirin 325 mg orally every day  MRI  pending   MRA  pending   Carotid Doppler  pending   2D Echo  pending    HgbA1c pending   SCDs for VTE prophylaxis  Cardiac, patient did pass RN swallow, however, given neuro worsening over night, will make NPO and ask ST to reassess swallow  OOB with assistance  Resultant mild dysarthria and left hand weakness  Therapy needs:  pending   Ongoing aggressive risk factor management  Disposition:  pending   Hypertension   Permissive hypertension <220/120 for 24-48 hours and then gradually normalize within 5-7 days  BP goal long term normotensive  Home meds:  altace. Not yet resumed in hospital  BP 99-159/69-82 past 24h (07/04/2014 @ 12:59 PM)  Stable  Hyperlipidemia  Home meds:  none  LDL 116 -will start statin  LDL goal < 100 (<70 for diabetics)  Add statin -lipitor 20 mg  Other Stroke Risk Factors Cigarette smoker, quit 3 weeks ago ETOH use   Obesity, Body mass index is 37.37 kg/(m^2).    Family hx stroke (father)  Other Pertinent History  Hx trach w/ closure  COPD, asthma  Hospital day # 1   I have personally examined this patient, reviewed notes, independently viewed imaging studies, participated in medical decision making and plan of care. I have made any additions or clarifications directly to the above note. I had a long discussion with the patient and mother regarding his stroke, risk of neurological worsening, recurrent strokes and need for risk stratification evaluation and answered questions. Patient may qualify for participation in [name] trial has shown interest and will see if he can participate Delia Heady, MD Medical Director [facility] Pager: [phone] 07/04/2014 1:13 PM     To contact Stroke Continuity provider, please refer to [email or web address]. After hours, contact General Neurology

## 2014-07-04 NOTE — ED Notes (Signed)
During neuro check, family stating that pt's speech has become more slurred.  No new slurring noted during neuro check.  Kirkpatrick paged.  No new orders due to no other new symptoms during neuro exam.

## 2014-07-04 NOTE — H&P (Signed)
PCP:   Aura Dials, MD   Chief Complaint:  Slurred speech  HPI: 53 yo male with sudden onset of slurred speech and left facial drooping around 9pm.  symtpoms have improved since arrival to ED.  No weakness/n/t in extremeties.  No headache or vision changes.  No previous h/o cad or cva.  Pt evaluated by dr Amada Jupiter already, and due to his improvement of symptoms decided not to do tpa unless he deterirates.  Review of Systems:  Positive and negative as per HPI otherwise all other systems are negative  Past Medical History: Past Medical History  Diagnosis Date  . Asthma     Last asthma attack at age 57; History of trach at 16 months  . COPD (chronic obstructive pulmonary disease) 06/2012    Dx by Zoe Lan, [facility]   Past Surgical History  Procedure Laterality Date  . Tracheostomy closure    . Tracheostomy    . Fracture surgery      NO HX of Broken Bone.  Has rotator cuff tear repair and shoulder/clavicle reconstruction  . Joint replacement      NO HX of JOINT REPLACEMENT.  Rotator cuff tear repair and shoulder reconstruction    Medications: Prior to Admission medications   Medication Sig Start Date End Date Taking? Authorizing Provider  esomeprazole (NEXIUM) 40 MG capsule Take 40 mg by mouth daily as needed. For heartburn   Yes Historical Provider, MD  HYDROcodone-acetaminophen (NORCO/VICODIN) 5-325 MG per tablet Take 1-2 tablets by mouth every 4 (four) hours as needed for moderate pain.   Yes Historical Provider, MD  ramipril (ALTACE) 2.5 MG capsule Take 2.5 mg by mouth daily.   Yes Historical Provider, MD    Allergies:   Allergies  Allergen Reactions  . Tylox [Oxycodone-Acetaminophen] Hives and Other (See Comments)    Reaction: Tremors and diaphoresis     Social History:  reports that he has been smoking Cigarettes.  He started smoking about 39 years ago. He has been smoking about 1.00 pack per day. He has never used smokeless tobacco.  He reports that he drinks about 1.8 ounces of alcohol per week. He reports that he does not use illicit drugs.  Family History: Family History  Problem Relation Age of Onset  . Heart disease Mother     s/p 3V CABG  . Cancer Father 36    lung cancer; +tobacco  . Cancer Maternal Grandmother   . Heart disease Maternal Grandmother   . Cancer Paternal Grandmother   . Lupus Sister   . Multiple sclerosis Sister   . Anemia Daughter     Physical Exam: Filed Vitals:   07/03/14 2223 07/03/14 2230 07/03/14 2245 07/03/14 2305  BP:  136/68 134/73 144/82  Pulse:  64 64 66  Temp: 98 F (36.7 C)     TempSrc:      Resp:  Height:      Weight:      SpO2:  96% 97% 95%   General appearance: alert, cooperative and no distress Head: Normocephalic, without obvious abnormality, atraumatic Eyes: negative Nose: Nares normal. Septum midline. Mucosa normal. No drainage or sinus tenderness. Neck: no JVD and supple, symmetrical, trachea midline Lungs: clear to auscultation bilaterally Heart: regular rate and rhythm, S1, S2 normal, no murmur, click, rub or gallop Abdomen: soft, non-tender; bowel sounds normal; no masses,  no organomegaly Extremities: extremities normal, atraumatic, no cyanosis or edema Pulses: 2+ and symmetric Skin: Skin color, texture,  turgor normal. No rashes or lesions Neurologic: Mental status: Alert, oriented, thought content appropriate Cranial nerves: normal x small left facial droop at labial fold speech mild slurred  Strength 5/5 thruout   Labs on Admission:   Recent Labs  07/03/14 2139 07/03/14 2207  NA 139 139  K 3.9 3.7  CL 100 102  CO2 27  --   GLUCOSE 144* 144*  BUN 12 11  CREATININE 0.90 1.00  CALCIUM 9.3  --     Recent Labs  07/03/14 2139  AST 16  ALT 18  ALKPHOS 60  BILITOT <0.2*  PROT 6.5  ALBUMIN 3.3*    Recent Labs  07/03/14 2139 07/03/14 2207  WBC 9.0  --   NEUTROABS 5.6  --   HGB 13.4 13.6  HCT 39.2 40.0  MCV 88.7  --    PLT 195  --    Radiological Exams on Admission: Ct Head (brain) Wo Contrast  07/03/2014   CLINICAL DATA:  Left facial droop  EXAM: CT HEAD WITHOUT CONTRAST  TECHNIQUE: Contiguous axial images were obtained from the base of the skull through the vertex without intravenous contrast.  COMPARISON:  None.  FINDINGS: There is no evidence of mass effect, midline shift or extra-axial fluid collections. There is no evidence of a space-occupying lesion or intracranial hemorrhage. There is no evidence of a cortical-based area of acute infarction.  The ventricles and sulci are appropriate for the patient's age. The basal cisterns are patent.  Visualized portions of the orbits are unremarkable. The visualized portions of the paranasal sinuses and mastoid air cells are unremarkable.  The osseous structures are unremarkable.  IMPRESSION: Normal CT of the brain without intravenous contrast.  These results were called by telephone at the time of interpretation on 07/03/2014 at 9:56 pm to Dr. Lyndon Code, who verbally acknowledged these results.   Electronically Signed   By: Elige Ko   On: 07/03/2014 22:00    Assessment/Plan  53 yo male with likely acute CVA  Principal Problem:   Acute CVA (cerebrovascular accident)-  Full cva w/u.  Admit to tele.  Pt is to wait in the ED until he is out of his tpa window which will be at 145am per dr Chipper Oman request (to avoid having to do tpa on the floor).  This has been relayed to the Company secretary and charge RN in the ED.  Neurology following.  Active Problems:   COPD (chronic obstructive pulmonary disease)   GERD (gastroesophageal reflux disease)  Admit to tele if no tpa.  Full code.  Rennie Hack A 07/04/2014, 12:01 AM

## 2014-07-04 NOTE — Progress Notes (Signed)
OT Cancellation Note  Patient Details Name: [name] MRN: [id number] DOB: Oct 25, 1960   Cancelled Treatment:    Reason Eval/Treat Not Completed: Other (comment) (order for 9/24- OT to follow up tomorrow when order starts)  Harolyn Rutherford Pager: [phone]  07/04/2014, 9:13 AM

## 2014-07-05 ENCOUNTER — Inpatient Hospital Stay (HOSPITAL_COMMUNITY): Payer: BC Managed Care – PPO

## 2014-07-05 DIAGNOSIS — J42 Unspecified chronic bronchitis: Secondary | ICD-10-CM

## 2014-07-05 DIAGNOSIS — I517 Cardiomegaly: Secondary | ICD-10-CM

## 2014-07-05 MED ORDER — IOHEXOL 300 MG/ML  SOLN
150.0000 mL | Freq: Once | INTRAMUSCULAR | Status: AC | PRN
  Administered 2014-07-05: 80 mL via INTRA_ARTERIAL

## 2014-07-05 MED ORDER — FENTANYL CITRATE 0.05 MG/ML IJ SOLN
INTRAMUSCULAR | Status: AC | PRN
  Administered 2014-07-05: 12.5 ug via INTRAVENOUS

## 2014-07-05 MED ORDER — LIDOCAINE HCL 1 % IJ SOLN
INTRAMUSCULAR | Status: AC
  Filled 2014-07-05: qty 20

## 2014-07-05 MED ORDER — HEPARIN SOD (PORK) LOCK FLUSH 100 UNIT/ML IV SOLN
INTRAVENOUS | Status: AC
  Filled 2014-07-05: qty 20

## 2014-07-05 MED ORDER — MIDAZOLAM HCL 2 MG/2ML IJ SOLN
INTRAMUSCULAR | Status: AC | PRN
Start: 2014-07-05 — End: 2014-07-05
  Administered 2014-07-05: 0.5 mg via INTRAVENOUS

## 2014-07-05 MED ORDER — FENTANYL CITRATE 0.05 MG/ML IJ SOLN
INTRAMUSCULAR | Status: AC
  Filled 2014-07-05: qty 2

## 2014-07-05 MED ORDER — PANTOPRAZOLE SODIUM 40 MG PO TBEC
40.0000 mg | DELAYED_RELEASE_TABLET | Freq: Two times a day (BID) | ORAL | Status: DC
Start: 2014-07-05 — End: 2014-07-06
  Administered 2014-07-05 – 2014-07-06 (×3): 40 mg via ORAL
  Filled 2014-07-05 (×3): qty 1

## 2014-07-05 MED ORDER — ACETAMINOPHEN 325 MG PO TABS
650.0000 mg | ORAL_TABLET | Freq: Four times a day (QID) | ORAL | Status: DC | PRN
  Administered 2014-07-05 – 2014-07-06 (×3): 650 mg via ORAL
  Filled 2014-07-05 (×3): qty 2

## 2014-07-05 MED ORDER — SODIUM CHLORIDE 0.9 % IV SOLN
INTRAVENOUS | Status: DC
  Administered 2014-07-05: 22:00:00 via INTRAVENOUS

## 2014-07-05 MED ORDER — IOHEXOL 350 MG/ML SOLN
50.0000 mL | Freq: Once | INTRAVENOUS | Status: AC | PRN
  Administered 2014-07-05: 50 mL via INTRAVENOUS

## 2014-07-05 MED ORDER — TRAMADOL HCL 50 MG PO TABS
50.0000 mg | ORAL_TABLET | Freq: Four times a day (QID) | ORAL | Status: DC | PRN
  Administered 2014-07-05 (×2): 50 mg via ORAL
  Filled 2014-07-05 (×2): qty 1

## 2014-07-05 MED ORDER — MIDAZOLAM HCL 2 MG/2ML IJ SOLN
INTRAMUSCULAR | Status: AC
  Filled 2014-07-05: qty 2

## 2014-07-05 MED ORDER — SODIUM CHLORIDE 0.9 % IV SOLN
INTRAVENOUS | Status: AC | PRN
  Administered 2014-07-05: 500 mL via INTRAVENOUS
  Administered 2014-07-05: 75 mL/h via INTRAVENOUS

## 2014-07-05 MED ORDER — HEPARIN SODIUM (PORCINE) 1000 UNIT/ML IJ SOLN
INTRAMUSCULAR | Status: AC | PRN
  Administered 2014-07-05: 1000 [IU] via INTRAVENOUS

## 2014-07-05 NOTE — Evaluation (Signed)
Physical Therapy Evaluation Patient Details Name: [name] MRN: [id number] DOB: 1960/12/29 Today's Date: 07/05/2014   History of Present Illness  53 yo male with sudden onset of slurred speech and left facial drooping around 9pm.  symtpoms have improved since arrival to ED. Pt with R basal ganglia infarct hx of HLD and recently quit smoking  Clinical Impression  Pt very pleasant who reports no numbness currently, pt states speech is at baseline but mother states not totally. Pt with mild weakness of LLE hip flexion and quads with decreased foot clearance on stairs. Pt educated for BE FAST (signs and symptoms of CVA) as well as risk factors, need to use rail in shower and caution with foot clearance for stepping over objects. Pt able to complete visual tracking and saccades without noted deficit at this time. Pt will benefit from acute therapy to maximize strength, mobility and gait to return pt to independent level.     Follow Up Recommendations Outpatient PT    Equipment Recommendations  None recommended by PT    Recommendations for Other Services       Precautions / Restrictions Precautions Precautions: Fall Restrictions Weight Bearing Restrictions: No      Mobility  Bed Mobility Overal bed mobility: Modified Independent                Transfers Overall transfer level: Modified independent                  Ambulation/Gait Ambulation/Gait assistance: Supervision Ambulation Distance (Feet): 350 Feet Assistive device: None Gait Pattern/deviations: Step-through pattern;Decreased stride length     General Gait Details: pt with functional gait speed but reports decreased from baseline  Stairs Stairs: Yes Stairs assistance: Min assist Stair Management: Two rails;Alternating pattern;Forwards Number of Stairs: 5 General stair comments: pt on first 2 steps not clearing Left foot completely with reliance on bil UE and therapist to prevent fall  Wheelchair  Mobility    Modified Rankin (Stroke Patients Only)       Balance Overall balance assessment: No apparent balance deficits (not formally assessed)                                           Pertinent Vitals/Pain Pain Assessment: 0-10 Pain Score: 5  Pain Location: frontal headache Pain Descriptors / Indicators: Aching Pain Intervention(s): Repositioned;Patient requesting pain meds-RN notified    Home Living Family/patient expects to be discharged to:: Private residence Living Arrangements: Parent Available Help at Discharge: Family;Available 24 hours/day Type of Home: House Home Access: Stairs to enter Entrance Stairs-Rails: Right Entrance Stairs-Number of Steps: 5 Home Layout: One level Home Equipment: Walker - 2 wheels;Grab bars - tub/shower      Prior Function Level of Independence: Independent         Comments: pt is a full-time truck Science writer   Dominant Hand: Right    Extremity/Trunk Assessment   Upper Extremity Assessment: Overall WFL for tasks assessed (mild decreased ROM Left shoulder hx of RCR)           Lower Extremity Assessment: RLE deficits/detail;LLE deficits/detail RLE Deficits / Details: 5/5 all myotomes with normal sensation LLE Deficits / Details: 4/5 hip flexion and 3+/5 knee extension, all others 5/5 with normal sensation throughout  Cervical / Trunk Assessment: Normal  Communication   Communication: No difficulties  Cognition Arousal/Alertness: Awake/alert Behavior  During Therapy: WFL for tasks assessed/performed Overall Cognitive Status: Within Functional Limits for tasks assessed                      General Comments General comments (skin integrity, edema, etc.): Pt able to complete 360degree turn in less than 4 sec, pick object off floor , reach 10" outside BOS, stand on one leg bil 10 sec    Exercises General Exercises - Lower Extremity Long Arc Quad: AROM;Seated;Left;10 reps Hip  Flexion/Marching: AROM;Seated;Left;10 reps      Assessment/Plan    PT Assessment Patient needs continued PT services  PT Diagnosis Abnormality of gait   PT Problem List Decreased strength;Decreased mobility  PT Treatment Interventions Gait training;Stair training;Therapeutic activities;Therapeutic exercise;Patient/family education   PT Goals (Current goals can be found in the Care Plan section) Acute Rehab PT Goals 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 PT Goal Formulation: With patient/family Time For Goal Achievement: 07/12/14 Potential to Achieve Goals: Good    Frequency Min 3X/week   Barriers to discharge        Co-evaluation               End of Session Equipment Utilized During Treatment: Gait belt Activity Tolerance: Patient tolerated treatment well Patient left: in chair;with call bell/phone within reach;with family/visitor present Nurse Communication: Mobility status         Time: 4098-1191 PT Time Calculation (min): 21 min   Charges:   PT Evaluation $Initial PT Evaluation Tier I: 1 Procedure PT Treatments $Therapeutic Activity: 8-22 mins   PT G CodesDelorse Lek 07/05/2014, 10:55 AM Delaney Meigs, PT [phone]

## 2014-07-05 NOTE — Progress Notes (Signed)
STROKE TEAM PROGRESS NOTE   HISTORY [name] is a 53 y.o. male with a history of "leaky valve" seen by Dr. Sharyn Lull presents with weakness, lightheadedness, left-sided numbness and slurred speech. His symptoms are rapidly improving, but he continues to have left arm numbness as well as left facial droop and slurred speech. He states that the symptoms started abruptly while he was sitting on the couch 07/03/2014 at 0915p. He has never had anything like this happen before.  Patient was not administered TPA secondary to mild symptoms. He was admitted for further evaluation and treatment.   SUBJECTIVE (INTERVAL HISTORY) His mother is at the bedside.  Overall he feels his condition is gradually improving he had a mass liver which confirmed a patchy large right basal ganglia name C. branch infarct. MRA of the brain showed likely proximal right ICA occlusion with recanalization of the terminal segment and flow in right MCA OBJECTIVE Temp:  [98.5 F (36.9 C)-99.4 F (37.4 C)] 99.4 F (37.4 C) (09/24 0436) Pulse Rate:  [55-105] 56 (09/24 2017) Cardiac Rhythm:  [-] Sinus bradycardia (09/24 1850) Resp:  [16-20] 16 (09/24 2017) BP: (100-134)/(51-75) 134/75 mmHg (09/24 1855) SpO2:  [92 %-97 %] 97 % (09/24 1855)   Recent Labs Lab 07/03/14 2143  GLUCAP 154*    Recent Labs Lab 07/03/14 2139 07/03/14 2207  NA 139 139  K 3.9 3.7  CL 100 102  CO2 27  --   GLUCOSE 144* 144*  BUN 12 11  CREATININE 0.90 1.00  CALCIUM 9.3  --     Recent Labs Lab 07/03/14 2139  AST 16  ALT 18  ALKPHOS 60  BILITOT <0.2*  PROT 6.5  ALBUMIN 3.3*    Recent Labs Lab 07/03/14 2139 07/03/14 2207  WBC 9.0  --   NEUTROABS 5.6  --   HGB 13.4 13.6  HCT 39.2 40.0  MCV 88.7  --   PLT 195  --    No results found for this basename: CKTOTAL, CKMB, CKMBINDEX, TROPONINI,  in the last 168 hours  Recent Labs  07/03/14 2139  LABPROT 12.1  INR 0.89   No results found for this basename: COLORURINE,  APPERANCEUR, LABSPEC, PHURINE, GLUCOSEU, HGBUR, BILIRUBINUR, KETONESUR, PROTEINUR, UROBILINOGEN, NITRITE, LEUKOCYTESUR,  in the last 72 hours     Component Value Date/Time   CHOL 165 07/04/2014 0436   TRIG 44 07/04/2014 0436   HDL 40 07/04/2014 0436   CHOLHDL 4.1 07/04/2014 0436   VLDL 9 07/04/2014 0436   LDLCALC 116* 07/04/2014 0436   Lab Results  Component Value Date   HGBA1C 6.0* 07/04/2014   No results found for this basename: labopia,  cocainscrnur,  labbenz,  amphetmu,  thcu,  labbarb    No results found for this basename: ETH,  in the last 168 hours  Ct Head Wo Contrast  07/04/2014   CLINICAL DATA:  Left arm weakness  EXAM: CT HEAD WITHOUT CONTRAST  TECHNIQUE: Contiguous axial images were obtained from the base of the skull through the vertex without intravenous contrast.  COMPARISON:  Prior CT from 07/03/2014  FINDINGS: Focal hypodensity is now seen within the right basal ganglia involving the lentiform nuclei (series 3, image 17), compatible with acute ischemic infarct. This is not definitely seen on prior exam. No intracranial hemorrhage. No significant midline shift. There is no hydrocephalus. Basilar cisterns remain patent.  No extra-axial fluid collection. Ventricles are normal size without evidence of hydrocephalus.  Calvarium and scalp soft tissues within normal limits. No acute  abnormality seen about the orbits.  Paranasal sinuses and mastoid air cells remain clear.  IMPRESSION: Evolving ischemic right basal ganglia infarct. No associated hemorrhage or significant mass effect.  Results were called by telephone at the time of interpretation on 07/04/2014 at 6:04 am to Dr. Ritta Slot , who verbally acknowledged these results.   Electronically Signed   By: Rise Mu M.D.   On: 07/04/2014 06:07   Ct Angio Neck W/cm &/or Wo/cm  07/05/2014   CLINICAL DATA:  Acute RIGHT brain stroke.  EXAM: CT ANGIOGRAPHY NECK  TECHNIQUE: Multidetector CT imaging of the neck was performed  using the standard protocol during bolus administration of intravenous contrast. Multiplanar CT image reconstructions and MIPs were obtained to evaluate the vascular anatomy. Carotid stenosis measurements (when applicable) are obtained utilizing NASCET criteria, using the distal internal carotid diameter as the denominator.  CONTRAST:  Omnipaque 350, 50 mL.  COMPARISON:  MRI brain and MRA intracranial 07/04/2014.  FINDINGS: Minor atheromatous change transverse arch. No proximal great vessel stenosis with conventional branching. No ostial vertebral disease.  The distal RIGHT common carotid artery is widely patent. The RIGHT external carotid artery is widely patent and mildly hypertrophied. In the proximal RIGHT internal carotid artery there is calcific plaque extending over 2 cm length. There is a large amount of low attenuation material within the lumen, consistent with soft plaque. There is residual patency of the lumen, continuous from the bifurcation to the skullbase, consistent with a string sign. There is also patency of the ascending pharyngeal artery, separate from the visualized contrast within the tiny residual RIGHT ICA lumen.  Minor non stenotic atheromatous change at the LEFT carotid bifurcation, predominantly calcific plaque. No dissection or measurable stenosis.  Both vertebral arteries widely patent and codominant.  Cervical spondylosis. No neck masses. Lung apices clear. Granulomatous disease of the mediastinum.  Good general agreement with prior MR.  Review of the MIP images confirms the above findings.  IMPRESSION: Critical stenosis at the RIGHT internal carotid artery origin due to soft plaque, with residual small continuous luminal patency, consistent with string sign. This luminal patency is separate from the adjacent ascending pharyngeal artery.  Non stenotic atheromatous change LEFT carotid bifurcation.  No vertebral artery abnormality.   Electronically Signed   By: Davonna Belling M.D.   On:  07/05/2014 14:17   [name]  07/04/2014   CLINICAL DATA:  Sudden onset of slurred speech and LEFT facial drooping along with left-sided numbness last seen normal 915 p.m. 07/03/2014. History of "leaky" heart valve. Stroke risk factors include hypertension, hyperlipidemia, and cigarette smoking. t-PA was not given.  EXAM: MRI HEAD WITHOUT CONTRAST  MRA HEAD WITHOUT CONTRAST  TECHNIQUE: Multiplanar, multiecho pulse sequences of the brain and surrounding structures were obtained without intravenous contrast. Angiographic images of the head were obtained using MRA technique without contrast.  COMPARISON:  CT head 07/03/2014 was normal, with evolutionary change showing cytotoxic edema in the RIGHT basal ganglia upon CT head followup 07/04/2014.  FINDINGS: MRI HEAD FINDINGS  There is a moderately large area of restricted diffusion affecting the RIGHT basal ganglia, external capsule, insula, frontal cortex and subcortical white matter and RIGHT parietal periventricular white matter consistent with an acute RIGHT MCA territory infarct. There is no hemorrhage on gradient or T1 sequences. The LEFT hemisphere and posterior fossa are unaffected.  Normal for age cerebral volume. No appreciable white matter disease. Absent flow related enhancement in the RIGHT internal carotid artery consistent with thrombosis. LEFT internal  carotid artery, basilar, and both vertebral arteries demonstrate robust flow voids.  No midline abnormality. Negative orbits, sinuses, and mastoids. No osseous findings. Shotty cervical lymph nodes.  MRA HEAD FINDINGS  Mild motion artifact results in image degradation.  Absent flow related enhancement in the upper cervical and petrous internal carotid artery on the RIGHT. Diminutive cavernous and supraclinoid RIGHT ICA segments are visualized, which could signify weak but residual antegrade flow, versus retrograde filling from collaterals. Consider CTA head and neck for further  characterization. Superimposed severe stenosis superior cavernous segment (image 2 series 501).  Robust upper cervical, petrous, cavernous and supraclinoid LEFT ICA.  Basilar artery widely patent with both vertebrals contributing.  Mild non stenotic irregularity at the origin of the LEFT A1 ACA. Both RIGHT and LEFT A2 and A3 anterior cerebral segments widely patent. Severely diseased anterior communicating artery. Severely diseased RIGHT A1 ACA.  LEFT middle cerebral artery territory unremarkable both proximally and distally. Mildly irregular and diminished caliber RIGHT M1 MCA. No appreciable filling of right M2 or M3 branches distally, likely shower of emboli.  BILATERAL MCA bifurcation/trifurcation aneurysms are suspected. On the LEFT the aneurysm measures approximately 3 x 5 x 5 mm. On the RIGHT, this area is more difficult to visualize due to diminished flow related enhancement but suspect 3 x 3 mm in size. Both are wide necked.  At or near the BILATERAL ICA termini there are outpouchings which are also possible aneurysms. In the proximal LEFT A1 segment there is 2 x 4 mm outpouching projecting medially and superiorly. In the proximal RIGHT M1 segment, there is a 2 mm outpouching which points superiorly. Both can be seen on image 13 series 503.  In the posterior fossa, there is no branch occlusion or aneurysm.  IMPRESSION: Moderate-sized acute RIGHT MCA territory infarct affecting basal ganglia, along with the insula, frontal, and parietal cortex/ subcortical white matter.  Suspected acute RIGHT internal carotid artery occlusion versus severe proximal stenosis.  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 Motion degradation prevents definitive evaluation.  Consider CTA head and neck to evaluate for  extent of proximal right carotid thrombosis, possible string sign, and to better assess the morphology of suspected intracranial aneurysms.   Electronically Signed   By: Davonna Belling M.D.   On: 07/04/2014 16:52   Mr Maxine Glenn Head/brain Wo Cm  07/04/2014   CLINICAL DATA:  Sudden onset of slurred speech and LEFT facial drooping along with left-sided numbness last seen normal 915 p.m. 07/03/2014. History of "leaky" heart valve. Stroke risk factors include hypertension, hyperlipidemia, and cigarette smoking. t-PA was not given.  EXAM: MRI HEAD WITHOUT CONTRAST  MRA HEAD WITHOUT CONTRAST  TECHNIQUE: Multiplanar, multiecho pulse sequences of the brain and surrounding structures were obtained without intravenous contrast. Angiographic images of the head were obtained using MRA technique without contrast.  COMPARISON:  CT head 07/03/2014 was normal, with evolutionary change showing cytotoxic edema in the RIGHT basal ganglia upon CT head followup 07/04/2014.  FINDINGS: MRI HEAD FINDINGS  There is a moderately large area of restricted diffusion affecting the RIGHT basal ganglia, external capsule, insula, frontal cortex and subcortical white matter and RIGHT parietal periventricular white matter consistent with an acute RIGHT MCA territory infarct. There is no hemorrhage on gradient or T1 sequences. The LEFT hemisphere and posterior fossa  are unaffected.  Normal for age cerebral volume. No appreciable white matter disease. Absent flow related enhancement in the RIGHT internal carotid artery consistent with thrombosis. LEFT internal carotid artery, basilar, and both vertebral arteries demonstrate robust flow voids.  No midline abnormality. Negative orbits, sinuses, and mastoids. No osseous findings. Shotty cervical lymph nodes.  MRA HEAD FINDINGS  Mild motion artifact results in image degradation.  Absent flow related enhancement in the upper cervical and petrous internal carotid artery on the RIGHT. Diminutive cavernous and  supraclinoid RIGHT ICA segments are visualized, which could signify weak but residual antegrade flow, versus retrograde filling from collaterals. Consider CTA head and neck for further characterization. Superimposed severe stenosis superior cavernous segment (image 2 series 501).  Robust upper cervical, petrous, cavernous and supraclinoid LEFT ICA.  Basilar artery widely patent with both vertebrals contributing.  Mild non stenotic irregularity at the origin of the LEFT A1 ACA. Both RIGHT and LEFT A2 and A3 anterior cerebral segments widely patent. Severely diseased anterior communicating artery. Severely diseased RIGHT A1 ACA.  LEFT middle cerebral artery territory unremarkable both proximally and distally. Mildly irregular and diminished caliber RIGHT M1 MCA. No appreciable filling of right M2 or M3 branches distally, likely shower of emboli.  BILATERAL MCA bifurcation/trifurcation aneurysms are suspected. On the LEFT the aneurysm measures approximately 3 x 5 x 5 mm. On the RIGHT, this area is more difficult to visualize due to diminished flow related enhancement but suspect 3 x 3 mm in size. Both are wide necked.  At or near the BILATERAL ICA termini there are outpouchings which are also possible aneurysms. In the proximal LEFT A1 segment there is 2 x 4 mm outpouching projecting medially and superiorly. In the proximal RIGHT M1 segment, there is a 2 mm outpouching which points superiorly. Both can be seen on image 13 series 503.  In the posterior fossa, there is no branch occlusion or aneurysm.  IMPRESSION: Moderate-sized acute RIGHT MCA territory infarct affecting basal ganglia, along with the insula, frontal, and parietal cortex/ subcortical white matter.  Suspected acute RIGHT internal carotid artery occlusion versus severe proximal stenosis.  A shower of emboli have likely extended into the RIGHT MCA M2 and M3 branches  BILATERAL anterior circulation aneurysms are suspected involving the MCA  bifurcation/trifurcations, as well as proximal left ACA A1 and right MCA M1 segments; see comments above. Motion degradation prevents definitive evaluation.  Consider CTA head and neck to evaluate for extent of proximal right carotid thrombosis, possible string sign, and to better assess the morphology of suspected intracranial aneurysms.   Electronically Signed   By: Davonna Belling M.D.   On: 07/04/2014 16:52     PHYSICAL EXAM Obese middle aged Caucasian male not in distress.Awake alert. Afebrile. Head is nontraumatic. Neck is supple without bruit. Hearing is normal. Cardiac exam no murmur or gallop. Lungs are clear to auscultation. Distal pulses are well felt. Neurological Exam : Awake alert oriented x 3 Dysarthric speech  But normal language.No face asymmetry. Tongue midline. No drift. Mild diminished fine finger movements on left. Orbits right over left upper extremity. Mild left grip weak.. Normal sensation . Normal coordination. NIHSS 1  Premorbid MRS 0 ASSESSMENT/PLAN  Mr. Admiral Marcucci is a 53 y.o. male with history of a "leaky valve" presenting with left sided numbness, left facial weakness and slurred speech. He did not receive IV t-PA due to mild symptoms. CT imaging confirms a right basal ganglia infarct.    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 date inlet terminal right ICA and proximal MCA suggestive of proximal right ICA occlusion versus retrograde flow  Carotid Doppler  pending   2D Echo  pending    HgbA1c 6.0  SCDs for VTE prophylaxis  NPO, patient did pass RN swallow, however, given neuro worsening over night, will make NPO and ask ST to reassess swallow  OOB with assistance  Resultant mild dysarthria and left hand weakness  Therapy needs:  pending   Ongoing  aggressive risk factor management  Disposition:  pending   Hypertension   Permissive hypertension <220/120 for 24-48 hours and then gradually normalize within 5-7 days  BP goal long term normotensive  Home meds:  altace. Not yet resumed in hospital  BP 99-159/69-82 past 24h (07/05/2014 @ 10:43 PM)  Stable  Hyperlipidemia  Home meds:  none  LDL 116 -will start statin  LDL goal < 100 (<70 for diabetics)  Add statin -lipitor 20 mg  Other Stroke Risk Factors Cigarette smoker, quit 3 weeks ago ETOH use   Obesity, Body mass index is 37.37 kg/(m^2).    Family hx stroke (father)  Other Pertinent History  Hx trach w/ closure  COPD, asthma  Hospital day # 2   I have personally examined this patient, reviewed notes, independently viewed imaging studies, participated in medical decision making and plan of care. I have made any additions or clarifications directly to the above note. I had a long discussion with the patient and mother regarding his stroke, risk of neurological worsening, recurrent strokes and need for risk stratification evaluation and answered questions. Patient  is participating in [name] trial . Plan to check CT angiogram to distinguish proximal right ICA occlusion versus string sign Delia Heady, MD Medical Director [facility] Pager: [phone] 07/05/2014 10:43 PM     To contact Stroke Continuity provider, please refer to [email or web address]. After hours, contact General Neurology

## 2014-07-05 NOTE — Progress Notes (Signed)
VASCULAR LAB PRELIMINARY  PRELIMINARY  PRELIMINARY  PRELIMINARY  Carotid Dopplers completed.    Preliminary report:  The right ICA appears occluded.  There is 40-59% left ICA stenosis.  Bilateral vertebral artery flow is antegrade.  [name], RVT 07/05/2014, 12:13 PM

## 2014-07-05 NOTE — Progress Notes (Signed)
  Echocardiogram 2D Echocardiogram has been performed.  [name] 07/05/2014, 9:31 AM

## 2014-07-05 NOTE — Progress Notes (Signed)
Nutrition Education Note  RD consulted for nutrition education regarding a Heart Healthy diet. Patient did not look at RD during education, he seemed disinterested; was looking at his phone the whole time.  Lipid Panel     Component Value Date/Time   CHOL 165 07/04/2014 0436   TRIG 44 07/04/2014 0436   HDL 40 07/04/2014 0436   CHOLHDL 4.1 07/04/2014 0436   VLDL 9 07/04/2014 0436   LDLCALC 116* 07/04/2014 0436    RD provided "Stroke Nutrition Therapy" handout from the Academy of [employer]. Discouraged intake of processed foods and use of salt shaker. Encouraged fresh fruits and vegetables as well as whole grain sources of carbohydrates to maximize fiber intake. Teach back method used.  Expect poor compliance.  Body mass index is 37.37 kg/(m^2). Pt meets criteria for class 2 obesity based on current BMI.  Current diet order is dysphagia 3 with thin liquids, patient is consuming approximately 100% of meals at this time. Labs and medications reviewed. No further nutrition interventions warranted at this time. RD contact information provided. If additional nutrition issues arise, please re-consult RD.   Joaquin Courts, RD, LDN, CNSC Pager [phone] After Hours Pager [phone]

## 2014-07-05 NOTE — Progress Notes (Signed)
[facility] Progress Note   [name] ZOX:[id number] DOB: [birth year] DOA: 07/03/2014 PCP: Aura Dials, MD  Brief narrative: [name] is a 53 y.o. male admitted with slurred speech, left facial droop found to have a right basal ganglia infarct on CT scan.    Subjective: Patient feels like he is feeling better, speech improved. Reports heart burn.   Assessment/Plan: Principal Problem:   Acute CVA - Aspirin, Statin - give nutritional education - CT angio of the neck pending.   Active Problems:  Asthma - start neb treatments PRN    GERD  - on Nexium at home- cont PPI  Glucose intolerance - HbA1c 6.0-  - advised pt to lose weight, dietary changes recommended- will need Metformin    Code Status: Full code Family Communication: family at bedside Disposition Plan: home DVT prophylaxis: Lovenox  Consultants: Neuro  Procedures: none  Antibiotics: Anti-infectives   None         Objective: Filed Weights   07/03/14 2141 07/04/14 0253  Weight: 111.131 kg (245 lb) 111.449 kg (245 lb 11.2 oz)    Intake/Output Summary (Last 24 hours) at 07/05/14 1107 Last data filed at 07/05/14 0800  Gross per 24 hour  Intake    400 ml  Output      0 ml  Net    400 ml     Vitals Filed Vitals:   07/05/14 0025 07/05/14 0436 07/05/14 0813 07/05/14 1049  BP: 115/52 100/51    Pulse: 71 64  105  Temp: 98.5 F (36.9 C) 99.4 F (37.4 C)    TempSrc: Oral Oral    Resp: 18 18    Height:      Weight:      SpO2: 95% 97% 95%     Exam: General: No acute respiratory distress Lungs: Clear to auscultation bilaterally without wheezes or crackles Cardiovascular: Regular rate and rhythm without murmur gallop or rub normal S1 and S2 Abdomen: Nontender, nondistended, soft, bowel sounds positive, no rebound, no ascites, no appreciable mass Extremities: No significant cyanosis, clubbing, or edema bilateral lower extremities Neuro: left facial droop, slurred speech, strength  intact in extermities  Data Reviewed: Basic Metabolic Panel:  Recent Labs Lab 07/03/14 2139 07/03/14 2207  NA 139 139  K 3.9 3.7  CL 100 102  CO2 27  --   GLUCOSE 144* 144*  BUN 12 11  CREATININE 0.90 1.00  CALCIUM 9.3  --    Liver Function Tests:  Recent Labs Lab 07/03/14 2139  AST 16  ALT 18  ALKPHOS 60  BILITOT <0.2*  PROT 6.5  ALBUMIN 3.3*   No results found for this basename: LIPASE, AMYLASE,  in the last 168 hours No results found for this basename: AMMONIA,  in the last 168 hours CBC:  Recent Labs Lab 07/03/14 2139 07/03/14 2207  WBC 9.0  --   NEUTROABS 5.6  --   HGB 13.4 13.6  HCT 39.2 40.0  MCV 88.7  --   PLT 195  --    Cardiac Enzymes: No results found for this basename: CKTOTAL, CKMB, CKMBINDEX, TROPONINI,  in the last 168 hours BNP (last 3 results) No results found for this basename: PROBNP,  in the last 8760 hours CBG:  Recent Labs Lab 07/03/14 2143  GLUCAP 154*    No results found for this or any previous visit (from the past 240 hour(s)).   Studies:  Recent x-ray studies have been reviewed in detail by the Attending Physician  Scheduled Meds:  Scheduled Meds: . albuterol  2.5 mg Nebulization QID  . atorvastatin  20 mg Oral q1800  . research study medication  100 mg Oral Daily  . research study medication  90 mg Oral BID  . sodium chloride  3 mL Intravenous Q12H   Continuous Infusions:   Time spent on care of this patient: 35 min   Zale Marcotte, MD 07/05/2014, 11:07 AM  LOS: 2 days   [facility] Office  [phone] Pager - Text Page per [email or web address]  If 7PM-7AM, please contact night-coverage [email or web address]

## 2014-07-05 NOTE — Consult Note (Signed)
Reason for consult: cerebral arteriogram Referring Physician(s): Dr. Lina Sayre  History of Present Illness: [name] is a 53 y.o. male recently admitted with slurred speech, left facial droop,left sided weakness/numbness, weakness/lightheadedness and imaging revealing right MCA CVA with possible right ICA occlusion vs severe stenosis ("string sign").Pt did not receive TPA secondary to mild symptoms per neurology. He is a prior smoker and denies history of prior strokes. Request is now received for diagnostic cerebral arteriogram for further evaluation.  Past Medical History  Diagnosis Date  . Asthma     Last asthma attack at age 53; History of trach at 16 months  . COPD (chronic obstructive pulmonary disease) 06/2012    Dx by Zoe Lan, [facility]    Past Surgical History  Procedure Laterality Date  . Tracheostomy closure    . Tracheostomy    . Fracture surgery      NO HX of Broken Bone.  Has rotator cuff tear repair and shoulder/clavicle reconstruction  . Joint replacement      NO HX of JOINT REPLACEMENT.  Rotator cuff tear repair and shoulder reconstruction    Allergies: Tylox  Medications: Prior to Admission medications   Medication Sig Start Date End Date Taking? Authorizing Provider  esomeprazole (NEXIUM) 40 MG capsule Take 40 mg by mouth daily as needed. For heartburn   Yes Historical Provider, MD  HYDROcodone-acetaminophen (NORCO/VICODIN) 5-325 MG per tablet Take 1-2 tablets by mouth every 4 (four) hours as needed for moderate pain.   Yes Historical Provider, MD  ramipril (ALTACE) 2.5 MG capsule Take 2.5 mg by mouth daily.   Yes Historical Provider, MD    Family History  Problem Relation Age of Onset  . Heart disease Mother     s/p 3V CABG  . Cancer Father 6    lung cancer; +tobacco  . Cancer Maternal Grandmother   . Heart disease Maternal Grandmother   . Cancer Paternal Grandmother   . Lupus Sister   . Multiple sclerosis Sister     . Anemia Daughter   . Stroke Father     History   Social History  . Marital Status: Married    Spouse Name: not together since 2007    Number of Children: 3  . Years of Education: 10   Occupational History  . Engineer, production     [employer]    Social History Main Topics  . Smoking status: Former Smoker -- 1.00 packs/day    Types: Cigarettes    Start date: 01/15/1975    Quit date: 06/03/2014  . Smokeless tobacco: Never Used     Comment: down from 2 ppd; has used an e-cigarette  . Alcohol Use: 1.8 oz/week    3 Cans of beer per week  . Drug Use: No  . Sexual Activity: Yes    Partners: Female     Comment: partner has had surgery to prevent pregnancy   Other Topics Concern  . None   Social History Narrative   Lives with his son and his mother.  His 52 son has no contact with the son's mother, though the patient is not legally separated from her.  She has a history of drug use and has been in prison several times.         Review of Systems  see above  Vital Signs: BP 100/51  Pulse 105  Temp(Src) 99.4 F (37.4 C) (Oral)  Resp 18  Ht  (1.727 m)  Wt 245  lb 11.2 oz (111.449 kg)  BMI 37.37 kg/m2  SpO2 94%  Physical Exam  Constitutional: He is oriented to person, place, and time. He appears well-developed and well-nourished.  Eyes: EOM are normal. Pupils are equal, round, and reactive to light.  Cardiovascular: Normal rate and regular rhythm.   Pulmonary/Chest: Effort normal.  Distant BS bilat; few wheezes noted  Abdominal: Soft. Bowel sounds are normal. There is no tenderness.  Musculoskeletal: Normal range of motion. He exhibits no edema.  Neurological: He is alert and oriented to person, place, and time.  Mild left facial droop, sl slurred speech; strength 5/5 all fours; no drift, FMM nl, tongue midline    Imaging: Ct Head Wo Contrast  07/04/2014   CLINICAL DATA:  Left arm weakness  EXAM: CT HEAD WITHOUT CONTRAST  TECHNIQUE: Contiguous axial images were  obtained from the base of the skull through the vertex without intravenous contrast.  COMPARISON:  Prior CT from 07/03/2014  FINDINGS: Focal hypodensity is now seen within the right basal ganglia involving the lentiform nuclei (series 3, image 17), compatible with acute ischemic infarct. This is not definitely seen on prior exam. No intracranial hemorrhage. No significant midline shift. There is no hydrocephalus. Basilar cisterns remain patent.  No extra-axial fluid collection. Ventricles are normal size without evidence of hydrocephalus.  Calvarium and scalp soft tissues within normal limits. No acute abnormality seen about the orbits.  Paranasal sinuses and mastoid air cells remain clear.  IMPRESSION: Evolving ischemic right basal ganglia infarct. No associated hemorrhage or significant mass effect.  Results were called by telephone at the time of interpretation on 07/04/2014 at 6:04 am to Dr. Ritta Slot , who verbally acknowledged these results.   Electronically Signed   By: Rise Mu M.D.   On: 07/04/2014 06:07   Ct Head (brain) Wo Contrast  07/03/2014   CLINICAL DATA:  Left facial droop  EXAM: CT HEAD WITHOUT CONTRAST  TECHNIQUE: Contiguous axial images were obtained from the base of the skull through the vertex without intravenous contrast.  COMPARISON:  None.  FINDINGS: There is no evidence of mass effect, midline shift or extra-axial fluid collections. There is no evidence of a space-occupying lesion or intracranial hemorrhage. There is no evidence of a cortical-based area of acute infarction.  The ventricles and sulci are appropriate for the patient's age. The basal cisterns are patent.  Visualized portions of the orbits are unremarkable. The visualized portions of the paranasal sinuses and mastoid air cells are unremarkable.  The osseous structures are unremarkable.  IMPRESSION: Normal CT of the brain without intravenous contrast.  These results were called by telephone at the time of  interpretation on 07/03/2014 at 9:56 pm to Dr. Lyndon Code, who verbally acknowledged these results.   Electronically Signed   By: Elige Ko   On: 07/03/2014 22:00   Ct Angio Neck W/cm &/or Wo/cm  07/05/2014   CLINICAL DATA:  Acute RIGHT brain stroke.  EXAM: CT ANGIOGRAPHY NECK  TECHNIQUE: Multidetector CT imaging of the neck was performed using the standard protocol during bolus administration of intravenous contrast. Multiplanar CT image reconstructions and MIPs were obtained to evaluate the vascular anatomy. Carotid stenosis measurements (when applicable) are obtained utilizing NASCET criteria, using the distal internal carotid diameter as the denominator.  CONTRAST:  Omnipaque 350, 50 mL.  COMPARISON:  MRI brain and MRA intracranial 07/04/2014.  FINDINGS: Minor atheromatous change transverse arch. No proximal great vessel stenosis with conventional branching. No ostial vertebral disease.  The distal RIGHT  common carotid artery is widely patent. The RIGHT external carotid artery is widely patent and mildly hypertrophied. In the proximal RIGHT internal carotid artery there is calcific plaque extending over 2 cm length. There is a large amount of low attenuation material within the lumen, consistent with soft plaque. There is residual patency of the lumen, continuous from the bifurcation to the skullbase, consistent with a string sign. There is also patency of the ascending pharyngeal artery, separate from the visualized contrast within the tiny residual RIGHT ICA lumen.  Minor non stenotic atheromatous change at the LEFT carotid bifurcation, predominantly calcific plaque. No dissection or measurable stenosis.  Both vertebral arteries widely patent and codominant.  Cervical spondylosis. No neck masses. Lung apices clear. Granulomatous disease of the mediastinum.  Good general agreement with prior MR.  Review of the MIP images confirms the above findings.  IMPRESSION: Critical stenosis at the RIGHT  internal carotid artery origin due to soft plaque, with residual small continuous luminal patency, consistent with string sign. This luminal patency is separate from the adjacent ascending pharyngeal artery.  Non stenotic atheromatous change LEFT carotid bifurcation.  No vertebral artery abnormality.   Electronically Signed   By: Davonna Belling M.D.   On: 07/05/2014 14:17   [name]  07/04/2014   CLINICAL DATA:  Sudden onset of slurred speech and LEFT facial drooping along with left-sided numbness last seen normal 915 p.m. 07/03/2014. History of "leaky" heart valve. Stroke risk factors include hypertension, hyperlipidemia, and cigarette smoking. t-PA was not given.  EXAM: MRI HEAD WITHOUT CONTRAST  MRA HEAD WITHOUT CONTRAST  TECHNIQUE: Multiplanar, multiecho pulse sequences of the brain and surrounding structures were obtained without intravenous contrast. Angiographic images of the head were obtained using MRA technique without contrast.  COMPARISON:  CT head 07/03/2014 was normal, with evolutionary change showing cytotoxic edema in the RIGHT basal ganglia upon CT head followup 07/04/2014.  FINDINGS: MRI HEAD FINDINGS  There is a moderately large area of restricted diffusion affecting the RIGHT basal ganglia, external capsule, insula, frontal cortex and subcortical white matter and RIGHT parietal periventricular white matter consistent with an acute RIGHT MCA territory infarct. There is no hemorrhage on gradient or T1 sequences. The LEFT hemisphere and posterior fossa are unaffected.  Normal for age cerebral volume. No appreciable white matter disease. Absent flow related enhancement in the RIGHT internal carotid artery consistent with thrombosis. LEFT internal carotid artery, basilar, and both vertebral arteries demonstrate robust flow voids.  No midline abnormality. Negative orbits, sinuses, and mastoids. No osseous findings. Shotty cervical lymph nodes.  MRA HEAD FINDINGS  Mild motion artifact  results in image degradation.  Absent flow related enhancement in the upper cervical and petrous internal carotid artery on the RIGHT. Diminutive cavernous and supraclinoid RIGHT ICA segments are visualized, which could signify weak but residual antegrade flow, versus retrograde filling from collaterals. Consider CTA head and neck for further characterization. Superimposed severe stenosis superior cavernous segment (image 2 series 501).  Robust upper cervical, petrous, cavernous and supraclinoid LEFT ICA.  Basilar artery widely patent with both vertebrals contributing.  Mild non stenotic irregularity at the origin of the LEFT A1 ACA. Both RIGHT and LEFT A2 and A3 anterior cerebral segments widely patent. Severely diseased anterior communicating artery. Severely diseased RIGHT A1 ACA.  LEFT middle cerebral artery territory unremarkable both proximally and distally. Mildly irregular and diminished caliber RIGHT M1 MCA. No appreciable filling of right M2 or M3 branches distally, likely shower of emboli.  BILATERAL MCA  bifurcation/trifurcation aneurysms are suspected. On the LEFT the aneurysm measures approximately 3 x 5 x 5 mm. On the RIGHT, this area is more difficult to visualize due to diminished flow related enhancement but suspect 3 x 3 mm in size. Both are wide necked.  At or near the BILATERAL ICA termini there are outpouchings which are also possible aneurysms. In the proximal LEFT A1 segment there is 2 x 4 mm outpouching projecting medially and superiorly. In the proximal RIGHT M1 segment, there is a 2 mm outpouching which points superiorly. Both can be seen on image 13 series 503.  In the posterior fossa, there is no branch occlusion or aneurysm.  IMPRESSION: Moderate-sized acute RIGHT MCA territory infarct affecting basal ganglia, along with the insula, frontal, and parietal cortex/ subcortical white matter.  Suspected acute RIGHT internal carotid artery occlusion versus severe proximal stenosis.  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 Motion degradation prevents definitive evaluation.  Consider CTA head and neck to evaluate for extent of proximal right carotid thrombosis, possible string sign, and to better assess the morphology of suspected intracranial aneurysms.   Electronically Signed   By: Davonna Belling M.D.   On: 07/04/2014 16:52   Mr Maxine Glenn Head/brain Wo Cm  07/04/2014   CLINICAL DATA:  Sudden onset of slurred speech and LEFT facial drooping along with left-sided numbness last seen normal 915 p.m. 07/03/2014. History of "leaky" heart valve. Stroke risk factors include hypertension, hyperlipidemia, and cigarette smoking. t-PA was not given.  EXAM: MRI HEAD WITHOUT CONTRAST  MRA HEAD WITHOUT CONTRAST  TECHNIQUE: Multiplanar, multiecho pulse sequences of the brain and surrounding structures were obtained without intravenous contrast. Angiographic images of the head were obtained using MRA technique without contrast.  COMPARISON:  CT head 07/03/2014 was normal, with evolutionary change showing cytotoxic edema in the RIGHT basal ganglia upon CT head followup 07/04/2014.  FINDINGS: MRI HEAD FINDINGS  There is a moderately large area of restricted diffusion affecting the RIGHT basal ganglia, external capsule, insula, frontal cortex and subcortical white matter and RIGHT parietal periventricular white matter consistent with an acute RIGHT MCA territory infarct. There is no hemorrhage on gradient or T1 sequences. The LEFT hemisphere and posterior fossa are unaffected.  Normal for age cerebral volume. No appreciable white matter disease. Absent flow related enhancement in the RIGHT internal carotid artery consistent with thrombosis. LEFT internal carotid artery, basilar, and both vertebral arteries  demonstrate robust flow voids.  No midline abnormality. Negative orbits, sinuses, and mastoids. No osseous findings. Shotty cervical lymph nodes.  MRA HEAD FINDINGS  Mild motion artifact results in image degradation.  Absent flow related enhancement in the upper cervical and petrous internal carotid artery on the RIGHT. Diminutive cavernous and supraclinoid RIGHT ICA segments are visualized, which could signify weak but residual antegrade flow, versus retrograde filling from collaterals. Consider CTA head and neck for further characterization. Superimposed severe stenosis superior cavernous segment (image 2 series 501).  Robust upper cervical, petrous, cavernous and supraclinoid LEFT ICA.  Basilar artery widely patent with both vertebrals contributing.  Mild non stenotic irregularity at the origin of the LEFT A1 ACA. Both RIGHT and LEFT A2 and A3 anterior cerebral segments widely patent. Severely diseased anterior communicating artery. Severely diseased RIGHT A1 ACA.  LEFT middle cerebral artery territory unremarkable both  proximally and distally. Mildly irregular and diminished caliber RIGHT M1 MCA. No appreciable filling of right M2 or M3 branches distally, likely shower of emboli.  BILATERAL MCA bifurcation/trifurcation aneurysms are suspected. On the LEFT the aneurysm measures approximately 3 x 5 x 5 mm. On the RIGHT, this area is more difficult to visualize due to diminished flow related enhancement but suspect 3 x 3 mm in size. Both are wide necked.  At or near the BILATERAL ICA termini there are outpouchings which are also possible aneurysms. In the proximal LEFT A1 segment there is 2 x 4 mm outpouching projecting medially and superiorly. In the proximal RIGHT M1 segment, there is a 2 mm outpouching which points superiorly. Both can be seen on image 13 series 503.  In the posterior fossa, there is no branch occlusion or aneurysm.  IMPRESSION: Moderate-sized acute RIGHT MCA territory infarct affecting basal  ganglia, along with the insula, frontal, and parietal cortex/ subcortical white matter.  Suspected acute RIGHT internal carotid artery occlusion versus severe proximal stenosis.  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 Motion degradation prevents definitive evaluation.  Consider CTA head and neck to evaluate for extent of proximal right carotid thrombosis, possible string sign, and to better assess the morphology of suspected intracranial aneurysms.   Electronically Signed   By: Davonna Belling M.D.   On: 07/04/2014 16:52    Labs: Lab Results  Component Value Date   WBC 9.0 07/03/2014   HCT 40.0 07/03/2014   MCV 88.7 07/03/2014   PLT 195 07/03/2014   NA 139 07/03/2014   K 3.7 07/03/2014   CL 102 07/03/2014   CO2 27 07/03/2014   GLUCOSE 144* 07/03/2014   BUN 11 07/03/2014   CREATININE 1.00 07/03/2014   CALCIUM 9.3 07/03/2014   PROT 6.5 07/03/2014   ALBUMIN 3.3* 07/03/2014   AST 16 07/03/2014   ALT 18 07/03/2014   ALKPHOS 60 07/03/2014   BILITOT <0.2* 07/03/2014   GFRNONAA >90 07/03/2014   GFRAA >90 07/03/2014   INR 0.89 07/03/2014    Assessment and Plan:  [name] is a 53 y.o. male recently admitted with slurred speech, left facial droop,left sided weakness/numbness, weakness/lightheadedness and imaging revealing right MCA CVA with possible right ICA occlusion vs severe stenosis ("string sign").Pt did not receive TPA secondary to mild symptoms per neurology. He is a prior smoker and denies history of prior strokes. Request is now received for diagnostic cerebral arteriogram for further evaluation. Imaging studies were reviewed by Dr. [name]. Details/risks of procedure d/w pt/family with their understanding and consent. Procedure will be performed later this evening with IV conscious  sedation- pt had lunch at 1330.         I spent a total of 20 minutes face to face in clinical consultation, greater than 50% of which was counseling/coordinating care for cerebral arteriogram.  Signed: Chinita Pester 07/05/2014, 4:39 PM

## 2014-07-05 NOTE — Progress Notes (Addendum)
Occupational Therapy Evaluation Patient Details Name: [name] MRN: [id number] DOB: 08-19-1961 Today's Date: 07/05/2014    History of Present Illness 53 yo with symptoms of L facial droop and slurred speech. MRI + acute CVA R MCA affecting BG, insula, frontal & parietal areas.    Clinical Impression   PTA, pt independent with ADL and mobility and worked full time as a [employer] architect. Pt presents with L inattention, visual/perceptual dysfunction, in addition to deficits below. Pt will be able to D/C home with 24/7 S and follow up with OT at a neuro outpt center. Pt will benefit from skilled OT services to facilitate D/C home due to below deficits. Discussed D/C plan with pt/family.     Follow Up Recommendations  Neuro outpt OT;Supervision/Assistance - 24 hour    Equipment Recommendations  Tub/shower seat    Recommendations for Other Services       Precautions / Restrictions Precautions Precautions: Fall Precaution Comments: L inattention Restrictions Weight Bearing Restrictions: No      Mobility Bed Mobility Overal bed mobility: Modified Independent                Transfers Overall transfer level: Modified independent                    Balance Overall balance assessment: No apparent balance deficits (not formally assessed)                                          ADL Overall ADL's : Needs assistance/impaired     Grooming: Set up;Supervision/safety   Upper Body Bathing: Supervision/ safety;Set up;Sitting   Lower Body Bathing: Supervison/ safety;Set up;Sit to/from stand   Upper Body Dressing : Set up;Supervision/safety;Sitting   Lower Body Dressing: Supervision/safety;Set up;Sit to/from stand   Toilet Transfer: Radiographer, therapeutic Details (indicate cue type and reason): pt bumping into wall on L Toileting- Clothing Manipulation and Hygiene: Modified independent;Sit to/from stand       Functional mobility during  ADLs: Supervision/safety;Cueing for safety (poor orientaiton of self in space. Ptrunning therapist into ) General ADL Comments: requires overall S     Vision Eye Alignment: Within Functional Limits Alignment/Gaze Preference: Other (comment) (R bias) Ocular Range of Motion: Within Functional Limits Tracking/Visual Pursuits: Able to track stimulus in all quads without difficulty Saccades: Within functional limits Convergence: Within functional limits         Perception Perception Perception Tested?: Yes Perception Deficits: Inattention/neglect;Spatial orientation Inattention/Neglect:  ( inattention) Spatial deficits: unable to complete "clock" task. All numbers on R side of clock and repeated again on L side of clocka dn again to fill in empty space.   Praxis Praxis Praxis tested?: Deficits Deficits: Motor Impersistence (LUE)    Pertinent Vitals/Pain Pain Assessment: 0-10 Pain Score: 5  Pain Location: head Pain Descriptors / Indicators: Headache Pain Intervention(s): Limited activity within patient's tolerance;Monitored during session     Hand Dominance Right   Extremity/Trunk Assessment Upper Extremity Assessment Upper Extremity Assessment: LUE deficits/detail LUE Deficits / Details: functional movement intact. L inattention.  LUE Coordination: decreased gross motor   Lower Extremity Assessment Lower Extremity Assessment: Defer to PT evaluation RLE Deficits / Details: 5/5 all myotomes with normal sensation LLE Deficits / Details: 4/5 hip flexion and 3+/5 knee extension, all others 5/5 with normal sensation throughout   Cervical / Trunk Assessment Cervical / Trunk Assessment: Normal  Communication Communication Communication: No difficulties   Cognition Arousal/Alertness: Awake/alert Behavior During Therapy: Impulsive Overall Cognitive Status: Impaired/Different from baseline Area of Impairment: Safety/judgement;Awareness;Problem solving          Safety/Judgement: Decreased awareness of deficits Awareness: Emergent   General Comments: LEFT INATTENTION. ?perseveration. Pt holding coke with L hand, dropped coke. staes he keeps dropping his phone with L hand   General Comments       Exercises       Shoulder Instructions      Home Living Family/patient expects to be discharged to:: Private residence Living Arrangements: Parent Available Help at Discharge: Family;Available 24 hours/day Type of Home: House Home Access: Stairs to enter [employer] of Steps: 5 Entrance Stairs-Rails: Right Home Layout: One level     Bathroom Shower/Tub: Producer, television/film/video: Standard Bathroom Accessibility: Yes How Accessible: Accessible via walker Home Equipment: Walker - 2 wheels;Grab bars - tub/shower          Prior Functioning/Environment Level of Independence: Independent        Comments: pt is a full-time Naval architect    OT Diagnosis: Generalized weakness;Cognitive deficits;Other (comment) (visual perceptual dysfunction)   OT Problem List: Impaired vision/perception;Decreased coordination;Decreased cognition;Decreased safety awareness   OT Treatment/Interventions: Self-care/ADL training;Therapeutic exercise;Neuromuscular education;Therapeutic activities;Cognitive remediation/compensation;Visual/perceptual remediation/compensation;Patient/family education    OT Goals(Current goals can be found in the care plan section) Acute Rehab OT Goals Patient Stated Goal: to get back to work OT Goal Formulation: With patient Time For Goal Achievement: 07/19/14  OT Frequency: Min 2X/week   Barriers to D/C:            Co-evaluation              End of Session Equipment Utilized During Treatment: Gait belt Nurse Communication: Mobility status  Activity Tolerance: Patient tolerated treatment well Patient left: in chair;with call bell/phone within reach   Time: 1119-1151 OT Time Calculation (min):  32 min Charges:  OT General Charges $OT Visit: 1 Procedure OT Evaluation $Initial OT Evaluation Tier I: 1 Procedure OT Treatments $Self Care/Home Management : 8-22 mins $Therapeutic Activity: 8-22 mins G-Codes:    [name],HILLARY Jul 10, 2014, 12:23 PM   [facility], OTR/L  [phone] 07/10/14

## 2014-07-05 NOTE — Plan of Care (Signed)
Problem: Consults Goal: Ischemic Stroke Patient Education See Patient Education Module for education specifics.  Outcome: Completed/Met Date Met:  07/05/14 Stroke  Booklet from pharmacy

## 2014-07-05 NOTE — Progress Notes (Signed)
Escorted via bed to 3W08 report to [facility] , R groin site c/d/i + pulses to RLE

## 2014-07-05 NOTE — Progress Notes (Signed)
Stroke book given to sister and explained Stroke recovery. Patient is sleeping during this time of discussion. The sister understood material and will help patient to understand stroke recovery material as well. Will notify oncoming nurse to perform Teach back to make sure patient understands if possible. Will continue to monitor patient to end of shift.

## 2014-07-05 NOTE — Procedures (Signed)
S/P 4 vessel cerebral arteriogram  RT CFA approach. Findings. 1.occluded RT ICA at the bulb with delayed retrograde opacification of the supraclinoid seg from the ophthalmic artery and RT PCOm . 2.Extensive filling defects in intracranial and extracranial ICA consistent with clot. 3.RT MCA fills via the ACom fron Lt ICA and RT PCOM from the verts. 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

## 2014-07-06 ENCOUNTER — Inpatient Hospital Stay (HOSPITAL_COMMUNITY): Payer: BC Managed Care – PPO

## 2014-07-06 DIAGNOSIS — I6529 Occlusion and stenosis of unspecified carotid artery: Secondary | ICD-10-CM

## 2014-07-06 DIAGNOSIS — E785 Hyperlipidemia, unspecified: Secondary | ICD-10-CM | POA: Diagnosis present

## 2014-07-06 DIAGNOSIS — J438 Other emphysema: Secondary | ICD-10-CM

## 2014-07-06 DIAGNOSIS — E1159 Type 2 diabetes mellitus with other circulatory complications: Secondary | ICD-10-CM | POA: Diagnosis present

## 2014-07-06 DIAGNOSIS — I798 Other disorders of arteries, arterioles and capillaries in diseases classified elsewhere: Secondary | ICD-10-CM

## 2014-07-06 MED ORDER — ALBUTEROL SULFATE HFA 108 (90 BASE) MCG/ACT IN AERS: 2.0000 | INHALATION_SPRAY | Freq: Four times a day (QID) | RESPIRATORY_TRACT | Status: DC | PRN

## 2014-07-06 MED ORDER — ALBUTEROL SULFATE (2.5 MG/3ML) 0.083% IN NEBU
2.5000 mg | INHALATION_SOLUTION | Freq: Four times a day (QID) | RESPIRATORY_TRACT | Status: DC
  Administered 2014-07-06: 2.5 mg via RESPIRATORY_TRACT
  Filled 2014-07-06: qty 3

## 2014-07-06 MED ORDER — STUDY - INVESTIGATIONAL DRUG SIMPLE RECORD: 100.0000 mg | Freq: Every day | Status: DC

## 2014-07-06 MED ORDER — METFORMIN HCL 500 MG PO TABS: 500.0000 mg | ORAL_TABLET | Freq: Two times a day (BID) | ORAL | Status: DC

## 2014-07-06 MED ORDER — ATORVASTATIN CALCIUM 20 MG PO TABS: 20.0000 mg | ORAL_TABLET | Freq: Every day | ORAL | Status: DC

## 2014-07-06 MED ORDER — ALBUTEROL SULFATE (2.5 MG/3ML) 0.083% IN NEBU
2.5000 mg | INHALATION_SOLUTION | RESPIRATORY_TRACT | Status: DC | PRN
  Administered 2014-07-06: 2.5 mg via RESPIRATORY_TRACT
  Filled 2014-07-06: qty 3

## 2014-07-06 MED ORDER — STUDY - INVESTIGATIONAL DRUG SIMPLE RECORD: 90.0000 mg | Freq: Two times a day (BID) | Status: DC

## 2014-07-06 MED ORDER — TIOTROPIUM BROMIDE MONOHYDRATE 18 MCG IN CAPS: 18.0000 ug | ORAL_CAPSULE | Freq: Every morning | RESPIRATORY_TRACT | Status: DC

## 2014-07-06 NOTE — Progress Notes (Signed)
Speech Language Pathology Treatment: Dysphagia  Patient Details Name: [name] MRN: [id number] DOB: 1961/04/26 Today's Date: 07/06/2014 Time: 8168-3870 SLP Time Calculation (min): 8 min  Assessment / Plan / Recommendation Clinical Impression  Pt. demonstrated modified independence during brief observation of solid and thin.  No evidence of aspiration or oral pocketing.  SLP recommends continue with regular texture and thin liquids, pills with liquid.  No follow up needed for swallow.   HPI HPI: 53 yo with PMH:  asthma, COPD admitted with sudden onset of slurred speech and left facial drooping.  CT Evolving ischemic right basal ganglia infarct.  No CXR.   Pertinent Vitals Pain Assessment: No/denies pain  SLP Plan  All goals met;Discharge SLP treatment due to (comment)    Recommendations Diet recommendations: Regular;Thin liquid Liquids provided via: Cup;Straw Medication Administration: Whole meds with puree Supervision: Patient able to self feed Compensations: Slow rate;Small sips/bites;Check for pocketing Postural Changes and/or Swallow Maneuvers: Seated upright 90 degrees              Oral Care Recommendations: Oral care BID Follow up Recommendations: None (none for swallowing) Plan: All goals met;Discharge SLP treatment due to (comment)    GO     Houston Siren 07/06/2014, 12:38 PM  Orbie Pyo Colvin Caroli.Ed [employer] [phone]

## 2014-07-06 NOTE — Progress Notes (Signed)
Nutrition Education Note  RD received 2nd consult for diet education. RD visited patient yesterday and provided diet education. He was not interested in education yesterday. Patient and his mother in room. Patient sleeping. Mother reports that the handout RD provided yesterday was probably at home with patient's daughter. Patient did not awaken when name called x 2. Patient not appropriate for further diet education at this time. Recommend outpatient diet education after d/c home, will order.  Joaquin Courts, RD, LDN, CNSC Pager [phone] After Hours Pager [phone]

## 2014-07-06 NOTE — Progress Notes (Signed)
1221 07-06-14 CM did make Epic Referral for Outpatient Rehab Services- PT/OT/ Speech Therapy at [facility] [address] Rehab will call pt with appointment times. No further needs from CM at this time. Gala Lewandowsky, RN,BSN [phone]

## 2014-07-06 NOTE — Discharge Summary (Signed)
Physician Discharge Summary  [name] UJW:[id number] DOB: 08-09-1961 DOA: 07/03/2014  PCP: Aura Dials, MD  Admit date: 07/03/2014 Discharge date: 07/06/2014  Time spent: >45 minutes  Recommendations for Outpatient Follow-up:  1. Home health OT, PT and SLP  2. Repeat A1c and Lipid panel in 3 months  Discharge Condition: stable Diet recommendation: heart healthy, low fat, carb modified  Discharge Diagnoses:  Principal Problem:   Acute CVA (cerebrovascular accident) Active Problems:   right ICAO (internal carotid artery occlusion)   Hyperlipidemia LDL goal <100   COPD (chronic obstructive pulmonary disease)   GERD (gastroesophageal reflux disease)   DM type 2 causing vascular disease   Morbid obesity   History of present illness:  [name] is a 53 y.o. male admitted with slurred speech,left-sided numbness and left facial droop found to have a right basal ganglia infarct on CT scan. He states that the symptoms started abruptly while he was sitting on the couch 07/03/2014 at 0915p.  His symptoms quickly were noted to improve. TPA was not administered.   Hospital Course:  Principal Problem:  Acute CVA  - due to right ICA occlusion- continues to have left facial droop, slurred speech and left arm numbness - MRI brain reveals a moderate-sized acute RIGHT MCA territory infarct affecting basal ganglia, along with the insula, frontal, and parietal cortex/subcortical white matter. Also seen "suspected right internal carotid artery occlusion vs severe proximal stenosis" - CT angio revealed critical stenosis of right ICA at origin due to soft plaque and residual small continuous luminal patency, consistent with string sign  - Cerebral angiogram -Findings.  1.occluded RT ICA at the bulb with delayed retrograde opacification of the supraclinoid seg from the ophthalmic artery and RT PCOm .  2.Extensive filling defects in intracranial and extracranial ICA consistent with clot.  3.RT  MCA fills via the ACom fron Lt ICA and RT PCOM from the verts.  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  - Aspirin, Statin started - given nutritional education   Active Problems:   DM 2 - new diagnosis  - HbA1c 6.0-  - advised pt to lose weight, dietary changes recommended- dietician consulted to discuss dietary changes further with him - will prescibe Metformin - repeat A1c in 3 months  COPD - he was prescribed Spiriva and Albuterol inhaler as outpt but stopped using them as they did not help  - has been strictly advised to stop smoking - have re-prescibed Spiriva and Albuterol and advised him to not discontinue- he is to see his PCP to discuss further management of his COPD  GERD  - on Nexium at home- cont PPI   Morbid obesity - advised to start working on losing weigth   Procedures: Preliminary report: The right ICA appears occluded. There is 40-59% left ICA stenosis. Bilateral vertebral artery flow is antegrade.   Consultations:  Neurology   Discharge Exam: [facility] Weights   07/03/14 2141 07/04/14 0253  Weight: 111.131 kg (245 lb) 111.449 kg (245 lb 11.2 oz)   Filed Vitals:   07/06/14 0845  BP: 143/68  Pulse: 62  Temp: 98.2 F (36.8 C)  Resp: 18    General: AAO x  3, no distress Cardiovascular: RRR, no murmurs  Respiratory: clear to auscultation bilaterally GI: soft, non-tender, non-distended, bowel sound positive- ventral hernia noted Neuro: left facial droop, slurred speech, strength intact in all extermities  Discharge Instructions You were cared for by a hospitalist during your hospital stay. If you have any questions  about your discharge medications or the care you received while you were in the hospital after you are discharged, you can call the unit and asked to speak with the hospitalist on call if the hospitalist that took care of you is not available. Once you are discharged, your primary care physician will handle any further medical issues. Please note that NO REFILLS for any discharge medications will be authorized once you are discharged, as it is imperative that you return to your primary care physician (or establish a relationship with a primary care physician if you do not have one) for your aftercare needs so that they can reassess your need for medications and monitor your lab values.     Medication List         albuterol 108 (90 BASE) MCG/ACT inhaler  Commonly known as:  PROVENTIL HFA;VENTOLIN HFA  Inhale 2 puffs into the lungs every 6 (six) hours as needed for wheezing or shortness of breath.     atorvastatin 20 MG tablet  Commonly known as:  LIPITOR  Take 1 tablet (20 mg total) by mouth daily at 6 PM.     esomeprazole 40 MG capsule  Commonly known as:  NEXIUM  Take 40 mg by mouth daily as needed. For heartburn     HYDROcodone-acetaminophen 5-325 MG per tablet  Commonly known as:  NORCO/VICODIN  Take 1-2 tablets by mouth every 4 (four) hours as needed for moderate pain.     metFORMIN 500 MG tablet  Commonly known as:  GLUCOPHAGE  Take 1 tablet (500 mg total) by mouth 2 (two) times daily with a meal.     ramipril 2.5 MG capsule  Commonly known as:  ALTACE  Take 2.5 mg by mouth daily.     research study medication  Take 100 mg by mouth daily.     research study medication  Take 90 mg by mouth 2 (two) times daily.     tiotropium 18 MCG inhalation capsule  Commonly known as:  SPIRIVA HANDIHALER  Place 1 capsule (18 mcg total) into inhaler and inhale every morning.       Allergies  Allergen Reactions  . Tylox [Oxycodone-Acetaminophen] Hives and Other (See Comments)     Reaction: Tremors and diaphoresis       The results of significant diagnostics from this hospitalization (including imaging, microbiology, ancillary and laboratory) are listed below for reference.    Significant Diagnostic Studies: Ct Head Wo Contrast  07/04/2014   CLINICAL DATA:  Left arm weakness  EXAM: CT HEAD WITHOUT CONTRAST  TECHNIQUE: Contiguous axial images were obtained from the base of the skull through the vertex without intravenous contrast.  COMPARISON:  Prior CT from 07/03/2014  FINDINGS: Focal hypodensity is now seen within the right basal ganglia involving the lentiform nuclei (series 3, image 17), compatible with acute ischemic infarct. This is not definitely seen on prior exam. No intracranial hemorrhage. No significant midline shift. There is no hydrocephalus. Basilar cisterns remain patent.  No extra-axial fluid collection. Ventricles are normal size without evidence of hydrocephalus.  Calvarium and scalp soft tissues within normal limits. No acute abnormality seen about the orbits.  Paranasal sinuses and mastoid air  cells remain clear.  IMPRESSION: Evolving ischemic right basal ganglia infarct. No associated hemorrhage or significant mass effect.  Results were called by telephone at the time of interpretation on 07/04/2014 at 6:04 am to Dr. Ritta Slot , who verbally acknowledged these results.   Electronically Signed   By: Rise Mu M.D.   On: 07/04/2014 06:07   Ct Head (brain) Wo Contrast  07/03/2014   CLINICAL DATA:  Left facial droop  EXAM: CT HEAD WITHOUT CONTRAST  TECHNIQUE: Contiguous axial images were obtained from the base of the skull through the vertex without intravenous contrast.  COMPARISON:  None.  FINDINGS: There is no evidence of mass effect, midline shift or extra-axial fluid collections. There is no evidence of a space-occupying lesion or intracranial hemorrhage. There is no evidence of a cortical-based area of acute infarction.  The ventricles  and sulci are appropriate for the patient's age. The basal cisterns are patent.  Visualized portions of the orbits are unremarkable. The visualized portions of the paranasal sinuses and mastoid air cells are unremarkable.  The osseous structures are unremarkable.  IMPRESSION: Normal CT of the brain without intravenous contrast.  These results were called by telephone at the time of interpretation on 07/03/2014 at 9:56 pm to Dr. Lyndon Code, who verbally acknowledged these results.   Electronically Signed   By: Elige Ko   On: 07/03/2014 22:00   Ct Angio Neck W/cm &/or Wo/cm  07/05/2014   CLINICAL DATA:  Acute RIGHT brain stroke.  EXAM: CT ANGIOGRAPHY NECK  TECHNIQUE: Multidetector CT imaging of the neck was performed using the standard protocol during bolus administration of intravenous contrast. Multiplanar CT image reconstructions and MIPs were obtained to evaluate the vascular anatomy. Carotid stenosis measurements (when applicable) are obtained utilizing NASCET criteria, using the distal internal carotid diameter as the denominator.  CONTRAST:  Omnipaque 350, 50 mL.  COMPARISON:  MRI brain and MRA intracranial 07/04/2014.  FINDINGS: Minor atheromatous change transverse arch. No proximal great vessel stenosis with conventional branching. No ostial vertebral disease.  The distal RIGHT common carotid artery is widely patent. The RIGHT external carotid artery is widely patent and mildly hypertrophied. In the proximal RIGHT internal carotid artery there is calcific plaque extending over 2 cm length. There is a large amount of low attenuation material within the lumen, consistent with soft plaque. There is residual patency of the lumen, continuous from the bifurcation to the skullbase, consistent with a string sign. There is also patency of the ascending pharyngeal artery, separate from the visualized contrast within the tiny residual RIGHT ICA lumen.  Minor non stenotic atheromatous change at the LEFT  carotid bifurcation, predominantly calcific plaque. No dissection or measurable stenosis.  Both vertebral arteries widely patent and codominant.  Cervical spondylosis. No neck masses. Lung apices clear. Granulomatous disease of the mediastinum.  Good general agreement with prior MR.  Review of the MIP images confirms the above findings.  IMPRESSION: Critical stenosis at the RIGHT internal carotid artery origin due to soft plaque, with residual small continuous luminal patency, consistent with string sign. This luminal patency is separate from the adjacent ascending pharyngeal artery.  Non stenotic atheromatous change LEFT carotid bifurcation.  No vertebral artery abnormality.   Electronically Signed   By: Davonna Belling M.D.   On: 07/05/2014 14:17   [name]  07/04/2014   CLINICAL DATA:  Sudden onset of slurred speech and LEFT facial drooping along with left-sided numbness last seen normal 915 p.m. 07/03/2014. History of "  leaky" heart valve. Stroke risk factors include hypertension, hyperlipidemia, and cigarette smoking. t-PA was not given.  EXAM: MRI HEAD WITHOUT CONTRAST  MRA HEAD WITHOUT CONTRAST  TECHNIQUE: Multiplanar, multiecho pulse sequences of the brain and surrounding structures were obtained without intravenous contrast. Angiographic images of the head were obtained using MRA technique without contrast.  COMPARISON:  CT head 07/03/2014 was normal, with evolutionary change showing cytotoxic edema in the RIGHT basal ganglia upon CT head followup 07/04/2014.  FINDINGS: MRI HEAD FINDINGS  There is a moderately large area of restricted diffusion affecting the RIGHT basal ganglia, external capsule, insula, frontal cortex and subcortical white matter and RIGHT parietal periventricular white matter consistent with an acute RIGHT MCA territory infarct. There is no hemorrhage on gradient or T1 sequences. The LEFT hemisphere and posterior fossa are unaffected.  Normal for age cerebral volume. No  appreciable white matter disease. Absent flow related enhancement in the RIGHT internal carotid artery consistent with thrombosis. LEFT internal carotid artery, basilar, and both vertebral arteries demonstrate robust flow voids.  No midline abnormality. Negative orbits, sinuses, and mastoids. No osseous findings. Shotty cervical lymph nodes.  MRA HEAD FINDINGS  Mild motion artifact results in image degradation.  Absent flow related enhancement in the upper cervical and petrous internal carotid artery on the RIGHT. Diminutive cavernous and supraclinoid RIGHT ICA segments are visualized, which could signify weak but residual antegrade flow, versus retrograde filling from collaterals. Consider CTA head and neck for further characterization. Superimposed severe stenosis superior cavernous segment (image 2 series 501).  Robust upper cervical, petrous, cavernous and supraclinoid LEFT ICA.  Basilar artery widely patent with both vertebrals contributing.  Mild non stenotic irregularity at the origin of the LEFT A1 ACA. Both RIGHT and LEFT A2 and A3 anterior cerebral segments widely patent. Severely diseased anterior communicating artery. Severely diseased RIGHT A1 ACA.  LEFT middle cerebral artery territory unremarkable both proximally and distally. Mildly irregular and diminished caliber RIGHT M1 MCA. No appreciable filling of right M2 or M3 branches distally, likely shower of emboli.  BILATERAL MCA bifurcation/trifurcation aneurysms are suspected. On the LEFT the aneurysm measures approximately 3 x 5 x 5 mm. On the RIGHT, this area is more difficult to visualize due to diminished flow related enhancement but suspect 3 x 3 mm in size. Both are wide necked.  At or near the BILATERAL ICA termini there are outpouchings which are also possible aneurysms. In the proximal LEFT A1 segment there is 2 x 4 mm outpouching projecting medially and superiorly. In the proximal RIGHT M1 segment, there is a 2 mm outpouching which points  superiorly. Both can be seen on image 13 series 503.  In the posterior fossa, there is no branch occlusion or aneurysm.  IMPRESSION: Moderate-sized acute RIGHT MCA territory infarct affecting basal ganglia, along with the insula, frontal, and parietal cortex/ subcortical white matter.  Suspected acute RIGHT internal carotid artery occlusion versus severe proximal stenosis.  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 Motion degradation prevents definitive evaluation.  Consider CTA head and neck to evaluate for extent of proximal right carotid thrombosis, possible string sign, and to better assess the morphology of suspected intracranial aneurysms.   Electronically Signed   By: Davonna Belling M.D.   On: 07/04/2014 16:52   Dg Chest [facility]  07/06/2014   CLINICAL DATA:  Wheezing  EXAM: PORTABLE CHEST - 1 VIEW  COMPARISON:  None.  FINDINGS: There is no edema or consolidation. Heart is upper normal in size with pulmonary vascularity within normal limits. No adenopathy. No bone lesions.  IMPRESSION: No edema or consolidation.   Electronically Signed   By: Bretta Bang M.D.   On: 07/06/2014 07:59   Mr Maxine Glenn Head/brain Wo Cm  07/04/2014   CLINICAL DATA:  Sudden onset of slurred speech and LEFT facial drooping along with left-sided numbness last seen normal 915 p.m. 07/03/2014. History of "leaky" heart valve. Stroke risk factors include hypertension, hyperlipidemia, and cigarette smoking. t-PA was not given.  EXAM: MRI HEAD WITHOUT CONTRAST  MRA HEAD WITHOUT CONTRAST  TECHNIQUE: Multiplanar, multiecho pulse sequences of the brain and surrounding structures were obtained without intravenous contrast. Angiographic images of the head were obtained using MRA technique without contrast.  COMPARISON:  CT head  07/03/2014 was normal, with evolutionary change showing cytotoxic edema in the RIGHT basal ganglia upon CT head followup 07/04/2014.  FINDINGS: MRI HEAD FINDINGS  There is a moderately large area of restricted diffusion affecting the RIGHT basal ganglia, external capsule, insula, frontal cortex and subcortical white matter and RIGHT parietal periventricular white matter consistent with an acute RIGHT MCA territory infarct. There is no hemorrhage on gradient or T1 sequences. The LEFT hemisphere and posterior fossa are unaffected.  Normal for age cerebral volume. No appreciable white matter disease. Absent flow related enhancement in the RIGHT internal carotid artery consistent with thrombosis. LEFT internal carotid artery, basilar, and both vertebral arteries demonstrate robust flow voids.  No midline abnormality. Negative orbits, sinuses, and mastoids. No osseous findings. Shotty cervical lymph nodes.  MRA HEAD FINDINGS  Mild motion artifact results in image degradation.  Absent flow related enhancement in the upper cervical and petrous internal carotid artery on the RIGHT. Diminutive cavernous and supraclinoid RIGHT ICA segments are visualized, which could signify weak but residual antegrade flow, versus retrograde filling from collaterals. Consider CTA head and neck for further characterization. Superimposed severe stenosis superior cavernous segment (image 2 series 501).  Robust upper cervical, petrous, cavernous and supraclinoid LEFT ICA.  Basilar artery widely patent with both vertebrals contributing.  Mild non stenotic irregularity at the origin of the LEFT A1 ACA. Both RIGHT and LEFT A2 and A3 anterior cerebral segments widely patent. Severely diseased anterior communicating artery. Severely diseased RIGHT A1 ACA.  LEFT middle cerebral artery territory unremarkable both proximally and distally. Mildly irregular and diminished caliber RIGHT M1 MCA. No appreciable filling of right M2 or M3 branches distally,  likely shower of emboli.  BILATERAL MCA bifurcation/trifurcation aneurysms are suspected. On the LEFT the aneurysm measures approximately 3 x 5 x 5 mm. On the RIGHT, this area is more difficult to visualize due to diminished flow related enhancement but suspect 3 x 3 mm in size. Both are wide necked.  At or near the BILATERAL ICA termini there are outpouchings which are also possible aneurysms. In the proximal LEFT A1 segment there is 2 x 4 mm outpouching projecting medially and superiorly. In the proximal RIGHT M1 segment, there is a 2 mm outpouching which points superiorly. Both can be seen on image 13 series 503.  In the posterior fossa, there is no branch occlusion or aneurysm.  IMPRESSION: Moderate-sized acute RIGHT MCA territory infarct affecting basal ganglia, along with the insula, frontal, and parietal cortex/ subcortical white matter.  Suspected acute RIGHT internal carotid artery occlusion versus severe  proximal stenosis.  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 Motion degradation prevents definitive evaluation.  Consider CTA head and neck to evaluate for extent of proximal right carotid thrombosis, possible string sign, and to better assess the morphology of suspected intracranial aneurysms.   Electronically Signed   By: Davonna Belling M.D.   On: 07/04/2014 16:52    Microbiology: No results found for this or any previous visit (from the past 240 hour(s)).   Labs: Basic Metabolic Panel:  Recent Labs Lab 07/03/14 2139 07/03/14 2207  NA 139 139  K 3.9 3.7  CL 100 102  CO2 27  --   GLUCOSE 144* 144*  BUN 12 11  CREATININE 0.90 1.00  CALCIUM 9.3  --    Liver Function Tests:  Recent Labs Lab 07/03/14 2139  AST 16  ALT 18  ALKPHOS 60  BILITOT <0.2*  PROT 6.5  ALBUMIN  3.3*   No results found for this basename: LIPASE, AMYLASE,  in the last 168 hours No results found for this basename: AMMONIA,  in the last 168 hours CBC:  Recent Labs Lab 07/03/14 2139 07/03/14 2207  WBC 9.0  --   NEUTROABS 5.6  --   HGB 13.4 13.6  HCT 39.2 40.0  MCV 88.7  --   PLT 195  --    Cardiac Enzymes: No results found for this basename: CKTOTAL, CKMB, CKMBINDEX, TROPONINI,  in the last 168 hours BNP: BNP (last 3 results) No results found for this basename: PROBNP,  in the last 8760 hours CBG:  Recent Labs Lab 07/03/14 2143  GLUCAP 154*       SignedCalvert Cantor, MD [facility] 07/06/2014, 12:11 PM

## 2014-07-06 NOTE — Evaluation (Signed)
Speech Language Pathology Evaluation Patient Details Name: [name] MRN: [id number] DOB: 28-Dec-1960 Today's Date: 07/06/2014 Time: 1657-9038 SLP Time Calculation (min): 8 min  Problem List:  Patient Active Problem List   Diagnosis Date Noted  . right ICAO (internal carotid artery occlusion) 07/06/2014  . DM type 2 causing vascular disease 07/06/2014  . Hyperlipidemia LDL goal <100 07/06/2014  . Morbid obesity 07/06/2014  . Acute CVA (cerebrovascular accident) 07/03/2014  . GERD (gastroesophageal reflux disease) 10/19/2012  . COPD (chronic obstructive pulmonary disease) 06/12/2012   Past Medical History:  Past Medical History  Diagnosis Date  . Asthma     Last asthma attack at age 30; History of trach at 16 months  . COPD (chronic obstructive pulmonary disease) 06/2012    Dx by [name], [facility]   Past Surgical History:  Past Surgical History  Procedure Laterality Date  . Tracheostomy closure    . Tracheostomy    . Fracture surgery      NO HX of Broken Bone.  Has rotator cuff tear repair and shoulder/clavicle reconstruction  . Joint replacement      NO HX of JOINT REPLACEMENT.  Rotator cuff tear repair and shoulder reconstruction   HPI:  53 yo with PMH:  asthma, COPD admitted with sudden onset of slurred speech and left facial drooping.  CT Evolving ischemic right basal ganglia infarct.  No CXR.   Assessment / Plan / Recommendation Clinical Impression  Pt. exhibited moderate dysarthria that has improved from initial assessment 9/23.  Impairments characterized by consonant/vocalic distortions and increased rate of speech.  Mild cognitive deficits in the area of memory (working, retrieval and prospective).  Recommend outpatient ST for facilitation of dysarthria and cognition.      SLP Assessment  All further Speech Lanaguage Pathology  needs can be addressed in the next venue of care    Follow Up Recommendations  Outpatient SLP (none for  swallowing)    Frequency and Duration        Pertinent Vitals/Pain Pain Assessment: No/denies pain   SLP Goals  Progression toward goals: Goals met, education completed, patient discharged from SLP  SLP Evaluation Prior Functioning  Cognitive/Linguistic Baseline: Within functional limits Type of Home: House Vocation: Full time employment (truck driver)   Cognition  Overall Cognitive Status: Impaired/Different from baseline Arousal/Alertness: Awake/alert Orientation Level: Oriented to person;Oriented to place;Disoriented to time;Oriented to situation Attention: Sustained Sustained Attention: Appears intact Memory: Impaired Memory Impairment: Retrieval deficit;Prospective memory;Decreased recall of new information Awareness: Impaired Awareness Impairment: Anticipatory impairment Problem Solving: Appears intact Safety/Judgment:  (questionable)    Comprehension  Auditory Comprehension Overall Auditory Comprehension: Appears within functional limits for tasks assessed Yes/No Questions: Within Functional Limits Commands: Within Functional Limits Conversation: Simple Visual Recognition/Discrimination Discrimination: Not tested Reading Comprehension Reading Status: Within funtional limits    Expression Expression Primary Mode of Expression: Verbal Verbal Expression Overall Verbal Expression: Appears within functional limits for tasks assessed Initiation: No impairment Level of Generative/Spontaneous Verbalization: Conversation Repetition: No impairment Naming: No impairment Pragmatics: No impairment Written Expression Dominant Hand: Right Written Expression: Not tested   Oral / Motor Oral Motor/Sensory Function Overall Oral Motor/Sensory Function: Impaired Labial ROM: Reduced left Labial Symmetry: Abnormal symmetry left Labial Strength: Reduced Labial Sensation: Reduced Lingual ROM: Within Functional Limits Lingual Symmetry: Within Functional Limits Lingual  Strength: Reduced Facial ROM: Reduced left Facial Symmetry: Left droop Facial Strength: Reduced Motor Speech Overall Motor Speech: Appears within functional limits for tasks assessed Respiration: Within functional limits  Phonation: Normal Resonance: Within functional limits Articulation: Within functional limitis Intelligibility: Intelligibility reduced Word: 75-100% accurate Phrase: 75-100% accurate Sentence: 50-74% accurate Conversation: 50-74% accurate Motor Planning: Witnin functional limits   GO     Houston Siren 07/06/2014, 1:06 PM  Orbie Pyo Jannette Cotham M.Ed [employer] [phone]

## 2014-07-06 NOTE — Progress Notes (Signed)
Occupational Therapy Treatment Patient Details Name: [name] MRN: [id number] DOB: March 23, 1961 Today's Date: 07/06/2014    History of present illness 53 yo with symptoms of L facial droop and slurred speech. MRI + acute CVA R MCA affecting BG, insula, frontal & parietal areas.    OT comments  Completed education with family regarding home safety addressing perceptual and apparent cognitive deficits. Pt to continue with neuro outpt OT.  Follow Up Recommendations  Outpatient OT;Supervision/Assistance - 24 hour (neuro outpt OT)    Equipment Recommendations  Tub/shower seat    Recommendations for Other Services      Precautions / Restrictions Precautions Precautions: Fall Precaution Comments: L inattention       Mobility Bed Mobility                  Transfers                      Balance                                   ADL                                         General ADL Comments: Discussed impact of impairments on funtion and safety with pt and mom. Pt with decreased insight into deficits and stating that he would wait a few days before driving. Discussed importance of refraining from driving with pt's Mom and need to not return to driving until cleared by his MD. also discussed need to refrian from use of power tools and using the stove, etc. Pt's mom verbalized understanding.       Vision                     Perception     Praxis      Cognition   Behavior During Therapy: Impulsive Overall Cognitive Status: Impaired/Different from baseline Area of Impairment: Safety/judgement          Safety/Judgement: Decreased awareness of deficits;Decreased awareness of safety Awareness: Emergent        Extremity/Trunk Assessment               Exercises Other Exercises Other Exercises: activities to increase attention to L Other Exercises:  using cancellation tasks for attention Other Exercises:  BUE gross motor coordination tasks   Shoulder Instructions       General Comments      Pertinent Vitals/ Pain       Pain Assessment: No/denies pain  Home Living       Type of Home: House                                  Prior Functioning/Environment              Frequency Min 2X/week     Progress Toward Goals  OT Goals(current goals can now be found in the care plan section)  Progress towards OT goals: Goals met/education completed, patient discharged from OT (will continue twith neuro outpt OT)  Acute Rehab OT Goals Patient Stated Goal: to get back to work OT Goal Formulation: With patient Time For Goal Achievement: 07/19/14 ADL Goals Pt/caregiver will Perform Home Exercise Program: With written  HEP provided;Left upper extremity Additional ADL Goal #1: pt/family will verbalize understanding of home safety and need for 24/7 S due to perceptual and attention deficits Additional ADL Goal #2: Pt will verbalize understanding need to give L hand "a job" to increase awreness LUE  Plan Discharge plan remains appropriate    Co-evaluation                 End of Session     Activity Tolerance Patient tolerated treatment well   Patient Left in bed;with call bell/phone within reach;with family/visitor present   Nurse Communication Mobility status        Time: 1331-1356 OT Time Calculation (min): 25 min  Charges: OT General Charges $OT Visit: 1 Procedure OT Treatments $Therapeutic Activity: 23-37 mins  WARD,HILLARY 07/06/2014, 3:06 PM   [facility], OTR/L  [phone] 07/06/2014

## 2014-07-06 NOTE — Progress Notes (Signed)
Physical Therapy Treatment Patient Details Name: [name] MRN: [id number] DOB: [birth year] Today's Date: 07-10-2014    History of Present Illness 53 yo with symptoms of L facial droop and slurred speech. MRI + acute CVA R MCA affecting BG, insula, frontal & parietal areas.     PT Comments    Pt progressing with mobility with cues for safety and speed. Pt with one period of inattention left with walking almost running into someone. Pt reading clock and signs appropriately. Pt educated for high level balance deficits and performed HEP. D/C plan and risk factors discussed with pt and family.   Follow Up Recommendations  Outpatient PT     Equipment Recommendations       Recommendations for Other Services       Precautions / Restrictions Precautions Precautions: Fall    Mobility  Bed Mobility Overal bed mobility: Modified Independent                Transfers Overall transfer level: Modified independent                  Ambulation/Gait Ambulation/Gait assistance: Supervision Ambulation Distance (Feet): 400 Feet Assistive device: None Gait Pattern/deviations: Step-through pattern;Decreased stride length   Gait velocity interpretation: at or above normal speed for age/gender     Stairs Stairs: Yes Stairs assistance: Supervision Stair Management: One rail Right;Alternating pattern;Forwards Number of Stairs: 5 General stair comments: pt able to clear foot without LOB with decreased speed on stairs  Wheelchair Mobility    Modified Rankin (Stroke Patients Only)       Balance Overall balance assessment: Needs assistance   Sitting balance-Leahy Scale: Good       Standing balance-Leahy Scale: Good                      Cognition Arousal/Alertness: Awake/alert Behavior During Therapy: Impulsive   Area of Impairment: Safety/judgement         Safety/Judgement: Decreased awareness of deficits          Exercises General Exercises -  Lower Extremity Hip Flexion/Marching: AROM;Seated;Left;20 reps Toe Raises: AROM;Seated;Left;20 reps    General Comments        Pertinent Vitals/Pain Pain Assessment: No/denies pain    Home Living                      Prior Function            PT Goals (current goals can now be found in the care plan section) Progress towards PT goals: Progressing toward goals    Frequency       PT Plan Current plan remains appropriate    Co-evaluation             End of Session   Activity Tolerance: Patient tolerated treatment well Patient left: in chair;with call bell/phone within reach;with family/visitor present     Time: 1002-1030 PT Time Calculation (min): 28 min  Charges:  $Gait Training: 8-22 mins $Physical Performance Test: 8-22 mins                    G Codes:      Delorse Lek 2014/07/10, 10:33 AM Delaney Meigs, PT [phone]

## 2014-07-06 NOTE — Progress Notes (Signed)
STROKE TEAM PROGRESS NOTE   HISTORY [name] is a 53 y.o. male with a history of "leaky valve" seen by Dr. Sharyn Lull presents with weakness, lightheadedness, left-sided numbness and slurred speech. His symptoms are rapidly improving, but he continues to have left arm numbness as well as left facial droop and slurred speech. He states that the symptoms started abruptly while he was sitting on the couch 07/03/2014 at 0915p. He has never had anything like this happen before.  Patient was not administered TPA secondary to mild symptoms. He was admitted for further evaluation and treatment.   SUBJECTIVE (INTERVAL HISTORY) His mother is at the bedside.  Overall he feels his condition is gradually improving he had a mass liver which confirmed a patchy large right basal ganglia name C. branch infarct. MRA of the brain showed likely proximal right ICA occlusion with recanalization of the terminal segment and flow in right MCA. CT angiogram suggested right ICA string sign and hence diagnostic four-vessel cerebral catheter angiogram was performed by Dr. Barrie Lyme last night which showed complete right ICA occlusion in the neck. 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 Patient has a strong family history of cerebral aneurysms and death. I had a long discussion with the patient and multiple family members about cerebral aneurysms, risk of rupture, treatment options and answered questions. OBJECTIVE Temp:  [98.2 F (36.8 C)-99.4 F (37.4 C)] 98.6 F (37 C) (09/25 1215) Pulse Rate:  [55-68] 63 (09/25 1215) Cardiac Rhythm:  [-] Normal sinus rhythm (09/25 0800) Resp:  [16-20] 18 (09/25 1215) BP: (115-143)/(57-75) 123/61 mmHg (09/25 1215) SpO2:  [92 %-98 %] 98 % (09/25 1215)   Recent Labs Lab 07/03/14 2143  GLUCAP 154*    Recent Labs Lab 07/03/14 2139 07/03/14 2207  NA 139 139  K 3.9 3.7  CL 100 102  CO2 27  --   GLUCOSE 144* 144*  BUN 12 11  CREATININE 0.90 1.00  CALCIUM  9.3  --     Recent Labs Lab 07/03/14 2139  AST 16  ALT 18  ALKPHOS 60  BILITOT <0.2*  PROT 6.5  ALBUMIN 3.3*    Recent Labs Lab 07/03/14 2139 07/03/14 2207  WBC 9.0  --   NEUTROABS 5.6  --   HGB 13.4 13.6  HCT 39.2 40.0  MCV 88.7  --   PLT 195  --    No results found for this basename: CKTOTAL, CKMB, CKMBINDEX, TROPONINI,  in the last 168 hours  Recent Labs  07/03/14 2139  LABPROT 12.1  INR 0.89   No results found for this basename: COLORURINE, APPERANCEUR, LABSPEC, PHURINE, GLUCOSEU, HGBUR, BILIRUBINUR, KETONESUR, PROTEINUR, UROBILINOGEN, NITRITE, LEUKOCYTESUR,  in the last 72 hours     Component Value Date/Time   CHOL 165 07/04/2014 0436   TRIG 44 07/04/2014 0436   HDL 40 07/04/2014 0436   CHOLHDL 4.1 07/04/2014 0436   VLDL 9 07/04/2014 0436   LDLCALC 116* 07/04/2014 0436   Lab Results  Component Value Date   HGBA1C 6.0* 07/04/2014   No results found for this basename: labopia,  cocainscrnur,  labbenz,  amphetmu,  thcu,  labbarb    No results found for this basename: ETH,  in the last 168 hours  Ct Angio Neck W/cm &/or Wo/cm  07/05/2014   CLINICAL DATA:  Acute RIGHT brain stroke.  EXAM: CT ANGIOGRAPHY NECK  TECHNIQUE: Multidetector CT imaging of the neck was performed using the standard protocol during bolus administration of intravenous contrast.  Multiplanar CT image reconstructions and MIPs were obtained to evaluate the vascular anatomy. Carotid stenosis measurements (when applicable) are obtained utilizing NASCET criteria, using the distal internal carotid diameter as the denominator.  CONTRAST:  Omnipaque 350, 50 mL.  COMPARISON:  MRI brain and MRA intracranial 07/04/2014.  FINDINGS: Minor atheromatous change transverse arch. No proximal great vessel stenosis with conventional branching. No ostial vertebral disease.  The distal RIGHT common carotid artery is widely patent. The RIGHT external carotid artery is widely patent and mildly hypertrophied. In the proximal  RIGHT internal carotid artery there is calcific plaque extending over 2 cm length. There is a large amount of low attenuation material within the lumen, consistent with soft plaque. There is residual patency of the lumen, continuous from the bifurcation to the skullbase, consistent with a string sign. There is also patency of the ascending pharyngeal artery, separate from the visualized contrast within the tiny residual RIGHT ICA lumen.  Minor non stenotic atheromatous change at the LEFT carotid bifurcation, predominantly calcific plaque. No dissection or measurable stenosis.  Both vertebral arteries widely patent and codominant.  Cervical spondylosis. No neck masses. Lung apices clear. Granulomatous disease of the mediastinum.  Good general agreement with prior MR.  Review of the MIP images confirms the above findings.  IMPRESSION: Critical stenosis at the RIGHT internal carotid artery origin due to soft plaque, with residual small continuous luminal patency, consistent with string sign. This luminal patency is separate from the adjacent ascending pharyngeal artery.  Non stenotic atheromatous change LEFT carotid bifurcation.  No vertebral artery abnormality.   Electronically Signed   By: Davonna Belling M.D.   On: 07/05/2014 14:17   Dg Chest Port 1 View  07/06/2014   CLINICAL DATA:  Wheezing  EXAM: PORTABLE CHEST - 1 VIEW  COMPARISON:  None.  FINDINGS: There is no edema or consolidation. Heart is upper normal in size with pulmonary vascularity within normal limits. No adenopathy. No bone lesions.  IMPRESSION: No edema or consolidation.   Electronically Signed   By: Bretta Bang M.D.   On: 07/06/2014 07:59     PHYSICAL EXAM Obese middle aged Caucasian male not in distress.Awake alert. Afebrile. Head is nontraumatic. Neck is supple without bruit. Hearing is normal. Cardiac exam no murmur or gallop. Lungs are clear to auscultation. Distal pulses are well felt. Neurological Exam : Awake alert oriented x 3  Dysarthric speech  But normal language.No face asymmetry. Tongue midline. No drift. Mild diminished fine finger movements on left. Orbits right over left upper extremity. Mild left grip weak.. Normal sensation . Normal coordination. NIHSS 1   ASSESSMENT/PLAN  [name] is a 53 y.o. male with history of a "leaky valve" presenting with left sided numbness, left facial weakness and slurred speech. He did not receive IV t-PA due to mild symptoms. CT imaging confirms a right basal ganglia infarct.  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 date inlet terminal right ICA and proximal MCA suggestive of proximal right ICA occlusion versus retrograde flow  Carotid Doppler Rt ICA occlusion  2D Echo  Left ventricle: The cavity size was normal. There was mild concentric hypertrophy. Systolic function was normal. The  estimated ejection fraction was in the range of 55% to 60%. Images were inadequate for LV wall motion assessment.     HgbA1c 6.0  SCDs for VTE prophylaxis  Dysphagia, patient did pass RN swallow, however, given neuro worsening over night, will make NPO and ask ST to reassess swallow  OOB with assistance  Resultant mild dysarthria and left hand weakness  Therapy needs:  Home PT/OT  Ongoing aggressive risk factor management  Disposition:  Home with HHPT/OT  Hypertension   Permissive hypertension <220/120 for 24-48 hours and then gradually normalize within 5-7 days  BP goal long term normotensive  Home meds:  altace. Not yet resumed in hospital  BP 99-159/69-82 past 24h (07/06/2014 @ 5:30  PM)  Stable  Hyperlipidemia  Home meds:  none  LDL 116 -will start statin  LDL goal < 100 (<70 for diabetics)  Add statin -lipitor 20 mg  Other Stroke Risk Factors Cigarette smoker, quit 3 weeks ago ETOH use   Obesity, Body mass index is 37.37 kg/(m^2).    Family hx stroke (father)  Other Pertinent History  Hx trach w/ closure  COPD, asthma  Hospital day # 3   I have personally examined this patient, reviewed notes, independently viewed imaging studies, participated in medical decision making and plan of care. I have made any additions or clarifications directly to the above note.  I had a long discussion with the patient and multiple family members regarding his asymptomatic left MCA aneurysm and recommend outpatient followup with Dr. [name] for elective treatment options. Continue [name] trial medication and start statin for elevated lipids and treatment for diabetes. Consider outpatient evaluation and treatment for sleep apnea. Delia Heady, MD Medical Director [facility] Pager: [phone] 07/06/2014 5:30 PM     To contact Stroke Continuity provider, please refer to [email or web address]. After hours, contact General Neurology

## 2014-07-07 ENCOUNTER — Emergency Department (HOSPITAL_COMMUNITY): Payer: BC Managed Care – PPO

## 2014-07-07 ENCOUNTER — Inpatient Hospital Stay (HOSPITAL_COMMUNITY)
Admission: EM | Admit: 2014-07-07 | Discharge: 2014-07-08 | DRG: 313 | Disposition: A | Discharge status: Home / Self Care | Payer: BC Managed Care – PPO | Attending: Cardiovascular Disease | Admitting: Cardiovascular Disease

## 2014-07-07 ENCOUNTER — Emergency Department (HOSPITAL_COMMUNITY)
Admission: EM | Admit: 2014-07-07 | Discharge: 2014-07-07 | Disposition: A | Discharge status: Home / Self Care | Payer: BC Managed Care – PPO | Attending: Emergency Medicine | Admitting: Emergency Medicine

## 2014-07-07 ENCOUNTER — Encounter (HOSPITAL_COMMUNITY): Payer: Self-pay | Admitting: Emergency Medicine

## 2014-07-07 DIAGNOSIS — R079 Chest pain, unspecified: Secondary | ICD-10-CM | POA: Diagnosis not present

## 2014-07-07 DIAGNOSIS — Z79899 Other long term (current) drug therapy: Secondary | ICD-10-CM

## 2014-07-07 DIAGNOSIS — R220 Localized swelling, mass and lump, head: Secondary | ICD-10-CM | POA: Insufficient documentation

## 2014-07-07 DIAGNOSIS — Z87891 Personal history of nicotine dependence: Secondary | ICD-10-CM | POA: Insufficient documentation

## 2014-07-07 DIAGNOSIS — J449 Chronic obstructive pulmonary disease, unspecified: Secondary | ICD-10-CM | POA: Diagnosis not present

## 2014-07-07 DIAGNOSIS — E669 Obesity, unspecified: Secondary | ICD-10-CM | POA: Diagnosis present

## 2014-07-07 DIAGNOSIS — I6350 Cerebral infarction due to unspecified occlusion or stenosis of unspecified cerebral artery: Secondary | ICD-10-CM | POA: Diagnosis not present

## 2014-07-07 DIAGNOSIS — Z7982 Long term (current) use of aspirin: Secondary | ICD-10-CM

## 2014-07-07 DIAGNOSIS — K219 Gastro-esophageal reflux disease without esophagitis: Secondary | ICD-10-CM | POA: Diagnosis present

## 2014-07-07 DIAGNOSIS — Z8673 Personal history of transient ischemic attack (TIA), and cerebral infarction without residual deficits: Secondary | ICD-10-CM

## 2014-07-07 DIAGNOSIS — R9431 Abnormal electrocardiogram [ECG] [EKG]: Secondary | ICD-10-CM

## 2014-07-07 DIAGNOSIS — R52 Pain, unspecified: Secondary | ICD-10-CM | POA: Diagnosis not present

## 2014-07-07 DIAGNOSIS — R51 Headache: Secondary | ICD-10-CM | POA: Diagnosis not present

## 2014-07-07 DIAGNOSIS — R221 Localized swelling, mass and lump, neck: Secondary | ICD-10-CM

## 2014-07-07 DIAGNOSIS — R519 Headache, unspecified: Secondary | ICD-10-CM

## 2014-07-07 DIAGNOSIS — J4489 Other specified chronic obstructive pulmonary disease: Secondary | ICD-10-CM | POA: Insufficient documentation

## 2014-07-07 DIAGNOSIS — I6529 Occlusion and stenosis of unspecified carotid artery: Secondary | ICD-10-CM | POA: Diagnosis present

## 2014-07-07 DIAGNOSIS — Z6836 Body mass index (BMI) 36.0-36.9, adult: Secondary | ICD-10-CM

## 2014-07-07 DIAGNOSIS — I639 Cerebral infarction, unspecified: Secondary | ICD-10-CM

## 2014-07-07 HISTORY — DX: Headache: R51

## 2014-07-07 HISTORY — DX: Cerebral infarction, unspecified: I63.9

## 2014-07-07 LAB — BASIC METABOLIC PANEL
ANION GAP: 13 (ref 5–15)
BUN: 16 mg/dL (ref 6–23)
CHLORIDE: 98 meq/L (ref 96–112)
CO2: 25 mEq/L (ref 19–32)
Calcium: 9.1 mg/dL (ref 8.4–10.5)
Creatinine, Ser: 0.99 mg/dL (ref 0.50–1.35)
GFR calc non Af Amer: >90 mL/min (ref >90)
Glucose, Bld: 110 mg/dL — ABNORMAL HIGH (ref 70–99)
POTASSIUM: 4.3 meq/L (ref 3.7–5.3)
SODIUM: 136 meq/L — AB (ref 137–147)

## 2014-07-07 LAB — URINALYSIS, ROUTINE W REFLEX MICROSCOPIC
Bilirubin Urine: NEGATIVE
Glucose, UA: NEGATIVE mg/dL
Hgb urine dipstick: NEGATIVE
KETONES UR: NEGATIVE mg/dL
LEUKOCYTES UA: NEGATIVE
NITRITE: NEGATIVE
PH: 5.5 (ref 5.0–8.0)
Protein, ur: NEGATIVE mg/dL
Specific Gravity, Urine: 1.031 — ABNORMAL HIGH (ref 1.005–1.030)
Urobilinogen, UA: 1 mg/dL (ref 0.0–1.0)

## 2014-07-07 LAB — HEPATIC FUNCTION PANEL
ALT: 16 U/L (ref 0–53)
AST: 17 U/L (ref 0–37)
Albumin: 3.6 g/dL (ref 3.5–5.2)
Alkaline Phosphatase: 65 U/L (ref 39–117)
Bilirubin, Direct: <0.2 mg/dL (ref 0.0–0.3)
TOTAL PROTEIN: 7.4 g/dL (ref 6.0–8.3)
Total Bilirubin: 0.4 mg/dL (ref 0.3–1.2)

## 2014-07-07 LAB — CBC
HCT: 42.5 % (ref 39.0–52.0)
Hemoglobin: 14.8 g/dL (ref 13.0–17.0)
MCH: 30.5 pg (ref 26.0–34.0)
MCHC: 34.8 g/dL (ref 30.0–36.0)
MCV: 87.6 fL (ref 78.0–100.0)
PLATELETS: 243 10*3/uL (ref 150–400)
RBC: 4.85 MIL/uL (ref 4.22–5.81)
RDW: 12.2 % (ref 11.5–15.5)
WBC: 8.1 10*3/uL (ref 4.0–10.5)

## 2014-07-07 LAB — I-STAT TROPONIN, ED: TROPONIN I, POC: 0 ng/mL (ref 0.00–0.08)

## 2014-07-07 MED ORDER — IPRATROPIUM-ALBUTEROL 0.5-2.5 (3) MG/3ML IN SOLN
3.0000 mL | Freq: Once | RESPIRATORY_TRACT | Status: AC
  Administered 2014-07-07: 3 mL via RESPIRATORY_TRACT
  Filled 2014-07-07: qty 3

## 2014-07-07 NOTE — Discharge Instructions (Signed)
Stop both of your clinical trial medications.  Aspirin 325 mg once per day to prevent future strokes.  General Headache Without Cause A headache is pain or discomfort felt around the head or neck area. The specific cause of a headache may not be found. There are many causes and types of headaches. A few common ones are:  Tension headaches.  Migraine headaches.  Cluster headaches.  Chronic daily headaches. HOME CARE INSTRUCTIONS   Keep all follow-up appointments with your caregiver or any specialist referral.  Only take over-the-counter or prescription medicines for pain or discomfort as directed by your caregiver.  Lie down in a dark, quiet room when you have a headache.  Keep a headache journal to find out what may trigger your migraine headaches. For example, write down:  What you eat and drink.  How much sleep you get.  Any change to your diet or medicines.  Try massage or other relaxation techniques.  Put ice packs or heat on the head and neck. Use these 3 to 4 times per day for 15 to 20 minutes each time, or as needed.  Limit stress.  Sit up straight, and do not tense your muscles.  Quit smoking if you smoke.  Limit alcohol use.  Decrease the amount of caffeine you drink, or stop drinking caffeine.  Eat and sleep on a regular schedule.  Get 7 to 9 hours of sleep, or as recommended by your caregiver.  Keep lights dim if bright lights bother you and make your headaches worse. SEEK MEDICAL CARE IF:   You have problems with the medicines you were prescribed.  Your medicines are not working.  You have a change from the usual headache.  You have nausea or vomiting. SEEK IMMEDIATE MEDICAL CARE IF:   Your headache becomes severe.  You have a fever.  You have a stiff neck.  You have loss of vision.  You have muscular weakness or loss of muscle control.  You start losing your balance or have trouble walking.  You feel faint or pass out.  You have  severe symptoms that are different from your first symptoms. MAKE SURE YOU:   Understand these instructions.  Will watch your condition.  Will get help right away if you are not doing well or get worse. Document Released: 09/28/2005 Document Revised: 12/21/2011 Document Reviewed: 10/14/2011 [facility] Patient Information 2015 [place], [place]. This information is not intended to replace advice given to you by your health care provider. Make sure you discuss any questions you have with your health care provider.  Ischemic Stroke A stroke (cerebrovascular accident) is the sudden death of brain tissue. It is a medical emergency. A stroke can cause permanent loss of brain function. This can cause problems with different parts of your body. A transient ischemic attack (TIA) is different because it does not cause permanent damage. A TIA is a short-lived problem of poor blood flow affecting a part of the brain. A TIA is also a serious problem because having a TIA greatly increases the chances of having a stroke. When symptoms first develop, you cannot know if the problem might be a stroke or a TIA. CAUSES  A stroke is caused by a decrease of oxygen supply to an area of your brain. It is usually the result of a small blood clot or collection of cholesterol or fat (plaque) that blocks blood flow in the brain. A stroke can also be caused by blocked or damaged carotid arteries.  RISK FACTORS  High blood pressure (hypertension).  High cholesterol.  Diabetes mellitus.  Heart disease.  The buildup of plaque in the blood vessels (peripheral artery disease or atherosclerosis).  The buildup of plaque in the blood vessels providing blood and oxygen to the brain (carotid artery stenosis).  An abnormal heart rhythm (atrial fibrillation).  Obesity.  Smoking.  Taking oral contraceptives (especially in combination with smoking).  Physical inactivity.  A diet high in fats, salt (sodium), and  calories.  Alcohol use.  Use of illegal drugs (especially cocaine and methamphetamine).  Being African American.  Being over the age of 57.  Family history of stroke.  Previous history of blood clots, stroke, TIA, or heart attack.  Sickle cell disease. SYMPTOMS  These symptoms usually develop suddenly, or may be newly present upon awakening from sleep:  Sudden weakness or numbness of the face, arm, or leg, especially on one side of the body.  Sudden trouble walking or difficulty moving arms or legs.  Sudden confusion.  Sudden personality changes.  Trouble speaking (aphasia) or understanding.  Difficulty swallowing.  Sudden trouble seeing in one or both eyes.  Double vision.  Dizziness.  Loss of balance or coordination.  Sudden severe headache with no known cause.  Trouble reading or writing. DIAGNOSIS  Your health care provider can often determine the presence or absence of a stroke based on your symptoms, history, and physical exam. Computed tomography (CT) of the brain is usually performed to confirm the stroke, determine causes, and determine stroke severity. Other tests may be done to find the cause of the stroke. These tests may include:  Electrocardiography.  Continuous heart monitoring.  Echocardiography.  Carotid ultrasonography.  Magnetic resonance imaging (MRI).  A scan of the brain circulation.  Blood tests. PREVENTION  The risk of a stroke can be decreased by appropriately treating high blood pressure, high cholesterol, diabetes, heart disease, and obesity and by quitting smoking, limiting alcohol, and staying physically active. TREATMENT  Time is of the essence. It is important to seek treatment at the first sign of these symptoms because you may receive a medicine to dissolve the clot (thrombolytic) that cannot be given if too much time has passed since your symptoms began. Even if you do not know when your symptoms began, get treatment as  soon as possible as there are other treatment options available including oxygen, intravenous (IV) fluids, and medicines to thin the blood (anticoagulants). Treatment of stroke depends on the duration, severity, and cause of your symptoms. Medicines and dietary changes may be used to address diabetes, high blood pressure, and other risk factors. Physical, speech, and occupational therapists will assess you and work with you to improve any functions impaired by the stroke. Measures will be taken to prevent short-term and long-term complications, including infection from breathing foreign material into the lungs (aspiration pneumonia), blood clots in the legs, bedsores, and falls. Rarely, surgery may be needed to remove large blood clots or to open up blocked arteries. HOME CARE INSTRUCTIONS   Take medicines only as directed by your health care provider. Follow the directions carefully. Medicines may be used to control risk factors for a stroke. Be sure you understand all your medicine instructions.  You may be told to take a medicine to thin the blood, such as aspirin or the anticoagulant warfarin. Warfarin needs to be taken exactly as instructed.  Too much and too little warfarin are both dangerous. Too much warfarin increases the risk of bleeding. Too little warfarin continues to allow  the risk for blood clots. While taking warfarin, you will need to have regular blood tests to measure your blood clotting time. These blood tests usually include both the PT and INR tests. The PT and INR results allow your health care provider to adjust your dose of warfarin. The dose can change for many reasons. It is critically important that you take warfarin exactly as prescribed, and that you have your PT and INR levels drawn exactly as directed.  Many foods, especially foods high in vitamin K, can interfere with warfarin and affect the PT and INR results. Foods high in vitamin K include spinach, kale, broccoli,  cabbage, collard and turnip greens, brussels sprouts, peas, cauliflower, seaweed, and parsley, as well as beef and pork liver, green tea, and soybean oil. You should eat a consistent amount of foods high in vitamin K. Avoid major changes in your diet, or notify your health care provider before changing your diet. Arrange a visit with a dietitian to answer your questions.  Many medicines can interfere with warfarin and affect the PT and INR results. You must tell your health care provider about any and all medicines you take. This includes all vitamins and supplements. Be especially cautious with aspirin and anti-inflammatory medicines. Do not take or discontinue any prescribed or over-the-counter medicine except on the advice of your health care provider or pharmacist.  Warfarin can have side effects, such as excessive bruising or bleeding. You will need to hold pressure over cuts for longer than usual. Your health care provider or pharmacist will discuss other potential side effects.  Avoid sports or activities that may cause injury or bleeding.  Be mindful when shaving, flossing your teeth, or handling sharp objects.  Alcohol can change the body's ability to handle warfarin. It is best to avoid alcoholic drinks or consume only very small amounts while taking warfarin. Notify your health care provider if you change your alcohol intake.  Notify your dentist or other health care providers before procedures.  If swallow studies have determined that your swallowing reflex is present, you should eat healthy foods. Including 5 or more servings of fruits and vegetables a day may reduce the risk of stroke. Foods may need to be a certain consistency (soft or pureed), or small bites may need to be taken in order to avoid aspirating or choking. Certain dietary changes may be advised to address high blood pressure, high cholesterol, diabetes, or obesity.  Food choices that are low in sodium, saturated fat,  trans fat, and cholesterol are recommended to manage high blood pressure.  Food choies that are high in fiber, and low in saturated fat, trans fat, and cholesterol may control cholesterol levels.  Controlling carbohydrates and sugar intake is recommended to manage diabetes.  Reducing calorie intake and making food choices that are low in sodium, saturated fat, trans fat, and cholesterol are recommended to manage obesity.  Maintain a healthy weight.  Stay physically active. It is recommended that you get at least 30 minutes of activity on all or most days.  Do not use any tobacco products including cigarettes, chewing tobacco, or electronic cigarettes.  Limit alcohol use even if you are not taking warfarin. Moderate alcohol use is considered to be:  No more than 2 drinks each day for men.  No more than 1 drink each day for nonpregnant women.  Home safety. A safe home environment is important to reduce the risk of falls. Your health care provider may arrange for specialists to evaluate  your home. Having grab bars in the bedroom and bathroom is often important. Your health care provider may arrange for equipment to be used at home, such as raised toilets and a seat for the shower.  Physical, occupational, and speech therapy. Ongoing therapy may be needed to maximize your recovery after a stroke. If you have been advised to use a walker or a cane, use it at all times. Be sure to keep your therapy appointments.  Follow all instructions for follow-up with your health care provider. This is very important. This includes any referrals, physical therapy, rehabilitation, and lab tests. Proper follow-up can prevent another stroke from occurring. SEEK MEDICAL CARE IF:  You have personality changes.  You have difficulty swallowing.  You are seeing double.  You have dizziness.  You have a fever.  You have skin breakdown. SEEK IMMEDIATE MEDICAL CARE IF:  Any of these symptoms may represent a  serious problem that is an emergency. Do not wait to see if the symptoms will go away. Get medical help right away. Call your local emergency services (911 in [place] Do not drive yourself to the hospital.  You have sudden weakness or numbness of the face, arm, or leg, especially on one side of the body.  You have sudden trouble walking or difficulty moving arms or legs.  You have sudden confusion.  You have trouble speaking (aphasia) or understanding.  You have sudden trouble seeing in one or both eyes.  You have a loss of balance or coordination.  You have a sudden, severe headache with no known cause.  You have new chest pain or an irregular heartbeat.  You have a partial or total loss of consciousness. Document Released: 09/28/2005 Document Revised: 02/12/2014 Document Reviewed: 05/08/2012 [facility] Patient Information 2015 [place], [place]. This information is not intended to replace advice given to you by your health care provider. Make sure you discuss any questions you have with your health care provider.

## 2014-07-07 NOTE — ED Provider Notes (Addendum)
CSN: [id number]     Arrival date & time 07/07/14  1310 History   First MD Initiated Contact with Patient 07/07/14 1329     Chief Complaint  Patient presents with  . Medication Reaction  . Headache      HPI  Patient presents with a complaint of a headache. He was seen and evaluated here diagnosed with a stroke. He was discharged yesterday. He developed a headache. Whenever he coughs he is worsening headache. He called his cardiologist. He states that he was told that he should not be taking medication that he is on. He was discharged on [name] clinical trial. This is 100 mg Aspirin daily blinded with Ticagralon(IIb/IIIa)/or Placebo.  He denies vision changes or any new neurological symptoms. He has some persistent left facial droop. He is eating without difficulty. He is walking without difficulty. His left upper extremity has not had any evolving symptoms today.  Past Medical History  Diagnosis Date  . Asthma     Last asthma attack at age 25; History of trach at 16 months  . COPD (chronic obstructive pulmonary disease) 06/2012    Dx by Zoe Lan, [facility]   Past Surgical History  Procedure Laterality Date  . Tracheostomy closure    . Tracheostomy    . Fracture surgery      NO HX of Broken Bone.  Has rotator cuff tear repair and shoulder/clavicle reconstruction  . Joint replacement      NO HX of JOINT REPLACEMENT.  Rotator cuff tear repair and shoulder reconstruction   Family History  Problem Relation Age of Onset  . Heart disease Mother     s/p 3V CABG  . Cancer Father 77    lung cancer; +tobacco  . Cancer Maternal Grandmother   . Heart disease Maternal Grandmother   . Cancer Paternal Grandmother   . Lupus Sister   . Multiple sclerosis Sister   . Anemia Daughter   . Stroke Father    History  Substance Use Topics  . Smoking status: Former Smoker -- 1.00 packs/day    Types: Cigarettes    Start date: 01/15/1975    Quit date: 06/03/2014  .  Smokeless tobacco: Never Used     Comment: down from 2 ppd; has used an e-cigarette  . Alcohol Use: 1.8 oz/week    3 Cans of beer per week    Review of Systems  Constitutional: Negative for fever, chills, diaphoresis, appetite change and fatigue.  HENT: Negative for mouth sores, sore throat and trouble swallowing.   Eyes: Negative for visual disturbance.  Respiratory: Negative for cough, chest tightness, shortness of breath and wheezing.   Cardiovascular: Negative for chest pain.  Gastrointestinal: Negative for nausea, vomiting, abdominal pain, diarrhea and abdominal distention.  Endocrine: Negative for polydipsia, polyphagia and polyuria.  Genitourinary: Negative for dysuria, frequency and hematuria.  Musculoskeletal: Negative for gait problem.  Skin: Negative for color change, pallor and rash.  Neurological: Positive for headaches. Negative for dizziness, syncope and light-headedness.       Facial weakness, unchanged.  Hematological: Does not bruise/bleed easily.  Psychiatric/Behavioral: Negative for behavioral problems and confusion.      Allergies  Tylox  Home Medications   Prior to Admission medications   Medication Sig Start Date End Date Taking? Authorizing Provider  acetaminophen (TYLENOL) 500 MG tablet Take 500 mg by mouth every 6 (six) hours as needed for headache.   Yes Historical Provider, MD  albuterol (PROVENTIL HFA;VENTOLIN HFA) 108 (90  BASE) MCG/ACT inhaler Inhale 2 puffs into the lungs every 6 (six) hours as needed for wheezing or shortness of breath. 07/06/14  Yes Calvert Cantor, MD  HYDROcodone-acetaminophen (NORCO/VICODIN) 5-325 MG per tablet Take 1-2 tablets by mouth every 4 (four) hours as needed for moderate pain.   Yes Historical Provider, MD  metFORMIN (GLUCOPHAGE) 500 MG tablet Take 1 tablet (500 mg total) by mouth 2 (two) times daily with a meal. 07/06/14  Yes Calvert Cantor, MD  ramipril (ALTACE) 2.5 MG capsule Take 2.5 mg by mouth daily.   Yes Historical  Provider, MD  atorvastatin (LIPITOR) 20 MG tablet Take 1 tablet (20 mg total) by mouth daily at 6 PM. 07/06/14   Calvert Cantor, MD  tiotropium (SPIRIVA HANDIHALER) 18 MCG inhalation capsule Place 1 capsule (18 mcg total) into inhaler and inhale every morning. 07/06/14   Calvert Cantor, MD   BP 109/61  Pulse 67  Temp(Src) 98 F (36.7 C) (Oral)  Resp 19  SpO2 97% Physical Exam  Constitutional: He is oriented to person, place, and time. He appears well-developed and well-nourished. No distress.  HENT:  Head: Normocephalic.  Eyes: Conjunctivae are normal. Pupils are equal, round, and reactive to light. No scleral icterus.  Neck: Normal range of motion. Neck supple. No thyromegaly present.  Cardiovascular: Normal rate and regular rhythm.  Exam reveals no gallop and no friction rub.   No murmur heard. Pulmonary/Chest: Effort normal and breath sounds normal. No respiratory distress. He has no wheezes. He has no rales.  Abdominal: Soft. Bowel sounds are normal. He exhibits no distension. There is no tenderness. There is no rebound.  Musculoskeletal: Normal range of motion.  Neurological: He is alert and oriented to person, place, and time.  Has apparent left facial droop. However when challenged the left lower face moves dynamically and symmetrically.  Skin: Skin is warm and dry. No rash noted.  Psychiatric: He has a normal mood and affect. His behavior is normal.    ED Course  Procedures (including critical care time) Labs Review Labs Reviewed - No data to display  Imaging Review Dg Chest [facility] 1 View  07/06/2014   CLINICAL DATA:  Wheezing  EXAM: PORTABLE CHEST - 1 VIEW  COMPARISON:  None.  FINDINGS: There is no edema or consolidation. Heart is upper normal in size with pulmonary vascularity within normal limits. No adenopathy. No bone lesions.  IMPRESSION: No edema or consolidation.   Electronically Signed   By: Bretta Bang M.D.   On: 07/06/2014 07:59     EKG Interpretation None       MDM   Final diagnoses:  None   CT noncontrast of the head obtained. Indication: headache and anticoagulation. To my read shows right parietal encephalomalacia. No obvious hemorrhage. 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 Mild effacement of sulci, lateral ventricle.  Pt with No new symptoms.  He and family state that he is eating, drinking, walking, and performing ADLs without difficulty.  On exam pt with slight droop.   Pt is adamant that he would not like to continue in the [name] trial. I did discuss this briefly with Dr. Thad Ranger of neurology. Since he was not on any anti- coagulations time of his initial infarct we will have him stop his trial medications in favor of 325 mg aspirin daily.    Rolland Porter, MD 07/07/14 1518  Rolland Porter, MD 07/08/14 [phone]

## 2014-07-07 NOTE — ED Notes (Signed)
Pt in c/o central chest pain and lower back pain, states he has been here several times recently for similar symptoms, also reports a recent stroke, pt alert and oriented, no distress noted

## 2014-07-07 NOTE — ED Provider Notes (Signed)
CSN: [id number]     Arrival date & time 07/07/14  2045 History   First MD Initiated Contact with Patient 07/07/14 2053     Chief Complaint  Patient presents with  . Chest Pain     (Consider location/radiation/quality/duration/timing/severity/associated sxs/prior Treatment) Patient is a 53 y.o. male presenting with chest pain.  Chest Pain Pain location:  R chest Pain quality: stabbing   Pain radiates to:  Epigastrium Pain radiates to the back: no   Pain severity:  Severe Onset quality:  Gradual Duration:  2 hours Timing:  Constant Progression:  Resolved Chronicity:  New Context: at rest   Relieved by:  None tried Worsened by:  Nothing tried Ineffective treatments:  None tried Associated symptoms: back pain   Associated symptoms: no abdominal pain, no diaphoresis, no fever, no headache, no lower extremity edema, no nausea, no near-syncope, no shortness of breath and not vomiting   Risk factors: diabetes mellitus, high cholesterol, hypertension and smoking     Past Medical History  Diagnosis Date  . Asthma     Last asthma attack at age 12; History of trach at 16 months  . COPD (chronic obstructive pulmonary disease) 06/2012    Dx by Zoe Lan, [facility]   Past Surgical History  Procedure Laterality Date  . Tracheostomy closure    . Tracheostomy    . Fracture surgery      NO HX of Broken Bone.  Has rotator cuff tear repair and shoulder/clavicle reconstruction  . Joint replacement      NO HX of JOINT REPLACEMENT.  Rotator cuff tear repair and shoulder reconstruction   Family History  Problem Relation Age of Onset  . Heart disease Mother     s/p 3V CABG  . Cancer Father 52    lung cancer; +tobacco  . Cancer Maternal Grandmother   . Heart disease Maternal Grandmother   . Cancer Paternal Grandmother   . Lupus Sister   . Multiple sclerosis Sister   . Anemia Daughter   . Stroke Father    History  Substance Use Topics  . Smoking status:  Former Smoker -- 1.00 packs/day    Types: Cigarettes    Start date: 01/15/1975    Quit date: 06/03/2014  . Smokeless tobacco: Never Used     Comment: down from 2 ppd; has used an e-cigarette  . Alcohol Use: 1.8 oz/week    3 Cans of beer per week    Review of Systems  Constitutional: Negative for fever, chills and diaphoresis.  HENT: Negative for congestion and sore throat.   Eyes: Negative for visual disturbance.  Respiratory: Negative for shortness of breath and wheezing.   Cardiovascular: Positive for chest pain. Negative for near-syncope.  Gastrointestinal: Negative for nausea, vomiting, abdominal pain, diarrhea and constipation.  Genitourinary: Negative for dysuria and difficulty urinating.  Musculoskeletal: Positive for back pain. Negative for arthralgias and myalgias.  Skin: Negative for wound.  Neurological: Negative for syncope and headaches.  Psychiatric/Behavioral: Negative for behavioral problems.  All other systems reviewed and are negative.     Allergies  Tylox  Home Medications   Prior to Admission medications   Medication Sig Start Date End Date Taking? Authorizing Provider  acetaminophen (TYLENOL) 500 MG tablet Take 500 mg by mouth every 6 (six) hours as needed for headache.   Yes Historical Provider, MD  albuterol (PROVENTIL HFA;VENTOLIN HFA) 108 (90 BASE) MCG/ACT inhaler Inhale 2 puffs into the lungs every 6 (six) hours as needed for  wheezing or shortness of breath. 07/06/14  Yes Calvert Cantor, MD  aspirin EC 325 MG tablet Take 325 mg by mouth daily.   Yes Historical Provider, MD  atorvastatin (LIPITOR) 20 MG tablet Take 1 tablet (20 mg total) by mouth daily at 6 PM. 07/06/14  Yes Calvert Cantor, MD  esomeprazole (NEXIUM) 40 MG capsule Take 40 mg by mouth daily at 12 noon.   Yes Historical Provider, MD  HYDROcodone-acetaminophen (NORCO/VICODIN) 5-325 MG per tablet Take 1-2 tablets by mouth every 4 (four) hours as needed for moderate pain.   Yes Historical Provider,  MD  metFORMIN (GLUCOPHAGE) 500 MG tablet Take 1 tablet (500 mg total) by mouth 2 (two) times daily with a meal. 07/06/14  Yes Calvert Cantor, MD  ramipril (ALTACE) 2.5 MG capsule Take 2.5 mg by mouth daily.   Yes Historical Provider, MD  tiotropium (SPIRIVA HANDIHALER) 18 MCG inhalation capsule Place 1 capsule (18 mcg total) into inhaler and inhale every morning. 07/06/14   Calvert Cantor, MD   BP 110/59  Pulse 68  Temp(Src) 98.7 F (37.1 C)  Resp 20  SpO2 97% Physical Exam  Constitutional: He is oriented to person, place, and time. He appears well-developed and well-nourished.  HENT:  Head: Normocephalic and atraumatic.  Eyes: EOM are normal.  Neck: Normal range of motion.  Cardiovascular: Normal rate, regular rhythm and normal heart sounds.   No murmur heard. Pulmonary/Chest: Effort normal. No respiratory distress. He has wheezes. He has no rales. He exhibits no tenderness.  Abdominal: Soft. Bowel sounds are normal. He exhibits no distension. There is no tenderness. There is no rebound and no guarding.  Musculoskeletal: He exhibits no edema.  Neurological: He is alert and oriented to person, place, and time.  Skin: No rash noted. He is not diaphoretic.    ED Course  Procedures (including critical care time) Labs Review Labs Reviewed  BASIC METABOLIC PANEL - Abnormal; Notable for the following:    Sodium 136 (*)    Glucose, Bld 110 (*)    All other components within normal limits  URINALYSIS, ROUTINE W REFLEX MICROSCOPIC - Abnormal; Notable for the following:    Color, Urine AMBER (*)    Specific Gravity, Urine 1.031 (*)    All other components within normal limits  CBC  HEPATIC FUNCTION PANEL  I-STAT TROPOININ, ED  I-STAT TROPOININ, ED    Imaging Review Dg Chest 2 View  07/07/2014   CLINICAL DATA:  Epigastric pain, chest pain  EXAM: CHEST  2 VIEW  COMPARISON:  07/06/2014  FINDINGS: The heart size and mediastinal contours are within normal limits. Both lungs are clear. The  visualized skeletal structures are unremarkable.  IMPRESSION: No active cardiopulmonary disease.   Electronically Signed   By: Elige Ko   On: 07/07/2014 22:51   Ct Head Wo Contrast  07/07/2014   CLINICAL DATA:  Severe headache, acute stroke  EXAM: CT HEAD WITHOUT CONTRAST  TECHNIQUE: Contiguous axial images were obtained from the base of the skull through the vertex without contrast.  COMPARISON:  07/04/2014  FINDINGS: Developing moderate sized hypodensity in right frontal lobe also involving the right basal ganglia, internal capsule, external capsule, and insular cortex. Findings are compatible with a known evolving acute right MCA territory infarct. No associated intracranial hemorrhage. Mild sulcal effacement and edema in the infarct region with slight effacement of the right lateral ventricle. No significant midline shift or hydrocephalus. No acute intracranial hemorrhage. No cerebellar abnormality. Cisterns remain patent.  IMPRESSION: Evolving changes  of a moderate size right MCA territory infarct with mild edema in the infarct zone and slight effacement of the right lateral ventricle compared to the prior study.  No acute intracranial hemorrhage.   Electronically Signed   By: Ruel Favors M.D.   On: 07/07/2014 15:10   Dg Chest Port 1 View  07/06/2014   CLINICAL DATA:  Wheezing  EXAM: PORTABLE CHEST - 1 VIEW  COMPARISON:  None.  FINDINGS: There is no edema or consolidation. Heart is upper normal in size with pulmonary vascularity within normal limits. No adenopathy. No bone lesions.  IMPRESSION: No edema or consolidation.   Electronically Signed   By: Bretta Bang M.D.   On: 07/06/2014 07:59     EKG Interpretation None      MDM   Final diagnoses:  Chest pain at rest  T wave inversion in EKG    Pt presents with acute right sided chest pain 1 1/2 hours ago.  Pt was playing poker and had a gradual stabbing sensation that radiated to his left UQ.  Pt denies prior MI but was recently  admitted for CVA and is currently taking ASA.  Pt's EKG upon arrival concerning b/c has TWI in inferior and lateral leads.  Pt states that he had complete resolution of pain upon arrival.  On exam pt is well appearing. BSUS attempted to assess GB however never visualized. Pt's w/u unremarkable.  Will admit for further testing to assess cardiac ischemia.     Beverely Risen, MD 07/08/14 [phone]

## 2014-07-07 NOTE — ED Notes (Signed)
Pt. Stated, I had a stroke and was discharged with medication that was not approved by the FDA and I called the doctor and he said i shouldn't be taken that medication.  Im also having a cough every time I cough.

## 2014-07-07 NOTE — ED Notes (Signed)
Pt took ASA PTA.

## 2014-07-07 NOTE — ED Notes (Signed)
Onset 7:30p chest pain.  Onset 8p lower back pain. Denies chest pain at this time.  Lower back pain. No other s/s noted.

## 2014-07-07 NOTE — ED Notes (Signed)
Dr. James at bedside  

## 2014-07-07 NOTE — ED Notes (Signed)
Pt back from x-ray.

## 2014-07-07 NOTE — ED Notes (Signed)
Patient transported to X-ray 

## 2014-07-08 ENCOUNTER — Encounter (HOSPITAL_COMMUNITY): Payer: Self-pay | Admitting: *Deleted

## 2014-07-08 DIAGNOSIS — Z8673 Personal history of transient ischemic attack (TIA), and cerebral infarction without residual deficits: Secondary | ICD-10-CM | POA: Diagnosis not present

## 2014-07-08 DIAGNOSIS — Z79899 Other long term (current) drug therapy: Secondary | ICD-10-CM | POA: Diagnosis not present

## 2014-07-08 DIAGNOSIS — E669 Obesity, unspecified: Secondary | ICD-10-CM | POA: Diagnosis present

## 2014-07-08 DIAGNOSIS — Z6836 Body mass index (BMI) 36.0-36.9, adult: Secondary | ICD-10-CM | POA: Diagnosis not present

## 2014-07-08 DIAGNOSIS — I6529 Occlusion and stenosis of unspecified carotid artery: Secondary | ICD-10-CM | POA: Diagnosis present

## 2014-07-08 DIAGNOSIS — Z7982 Long term (current) use of aspirin: Secondary | ICD-10-CM | POA: Diagnosis not present

## 2014-07-08 DIAGNOSIS — Z87891 Personal history of nicotine dependence: Secondary | ICD-10-CM | POA: Diagnosis not present

## 2014-07-08 DIAGNOSIS — K219 Gastro-esophageal reflux disease without esophagitis: Secondary | ICD-10-CM | POA: Diagnosis present

## 2014-07-08 DIAGNOSIS — R079 Chest pain, unspecified: Secondary | ICD-10-CM | POA: Diagnosis present

## 2014-07-08 DIAGNOSIS — J449 Chronic obstructive pulmonary disease, unspecified: Secondary | ICD-10-CM | POA: Diagnosis present

## 2014-07-08 DIAGNOSIS — J4489 Other specified chronic obstructive pulmonary disease: Secondary | ICD-10-CM | POA: Diagnosis not present

## 2014-07-08 LAB — CBC
HCT: 40.3 % (ref 39.0–52.0)
HCT: 39.2 % (ref 39.0–52.0)
HEMOGLOBIN: 13.9 g/dL (ref 13.0–17.0)
Hemoglobin: 13.5 g/dL (ref 13.0–17.0)
MCH: 30 pg (ref 26.0–34.0)
MCH: 30.1 pg (ref 26.0–34.0)
MCHC: 34.5 g/dL (ref 30.0–36.0)
MCHC: 34.4 g/dL (ref 30.0–36.0)
MCV: 87 fL (ref 78.0–100.0)
MCV: 87.5 fL (ref 78.0–100.0)
PLATELETS: 208 10*3/uL (ref 150–400)
Platelets: 192 10*3/uL (ref 150–400)
RBC: 4.63 MIL/uL (ref 4.22–5.81)
RBC: 4.48 MIL/uL (ref 4.22–5.81)
RDW: 12.4 % (ref 11.5–15.5)
RDW: 12.1 % (ref 11.5–15.5)
WBC: 8.1 10*3/uL (ref 4.0–10.5)
WBC: 6.3 10*3/uL (ref 4.0–10.5)

## 2014-07-08 LAB — PROTIME-INR
INR: 1.04 (ref 0.00–1.49)
Prothrombin Time: 13.6 seconds (ref 11.6–15.2)

## 2014-07-08 LAB — BASIC METABOLIC PANEL
Anion gap: 15 (ref 5–15)
BUN: 16 mg/dL (ref 6–23)
CALCIUM: 9.1 mg/dL (ref 8.4–10.5)
CO2: 24 meq/L (ref 19–32)
CREATININE: 0.86 mg/dL (ref 0.50–1.35)
Chloride: 99 mEq/L (ref 96–112)
GFR calc Af Amer: >90 mL/min (ref >90)
Glucose, Bld: 110 mg/dL — ABNORMAL HIGH (ref 70–99)
Potassium: 4.1 mEq/L (ref 3.7–5.3)
SODIUM: 138 meq/L (ref 137–147)

## 2014-07-08 LAB — I-STAT TROPONIN, ED: Troponin i, poc: 0 ng/mL (ref 0.00–0.08)

## 2014-07-08 LAB — LIPID PANEL
CHOL/HDL RATIO: 4.7 ratio
CHOLESTEROL: 166 mg/dL (ref 0–200)
HDL: 35 mg/dL — ABNORMAL LOW (ref >39)
LDL Cholesterol: 111 mg/dL — ABNORMAL HIGH (ref 0–99)
Triglycerides: 98 mg/dL (ref <150)
VLDL: 20 mg/dL (ref 0–40)

## 2014-07-08 LAB — TROPONIN I: Troponin I: <0.3 ng/mL (ref <0.30)

## 2014-07-08 LAB — GLUCOSE, CAPILLARY
GLUCOSE-CAPILLARY: 106 mg/dL — AB (ref 70–99)
GLUCOSE-CAPILLARY: 117 mg/dL — AB (ref 70–99)
GLUCOSE-CAPILLARY: 79 mg/dL (ref 70–99)

## 2014-07-08 LAB — HEPARIN LEVEL (UNFRACTIONATED): HEPARIN UNFRACTIONATED: 0.1 [IU]/mL — AB (ref 0.30–0.70)

## 2014-07-08 MED ORDER — RAMIPRIL 2.5 MG PO CAPS
2.5000 mg | ORAL_CAPSULE | Freq: Every day | ORAL | Status: DC
  Administered 2014-07-08: 2.5 mg via ORAL
  Filled 2014-07-08: qty 1

## 2014-07-08 MED ORDER — TIOTROPIUM BROMIDE MONOHYDRATE 18 MCG IN CAPS
18.0000 ug | ORAL_CAPSULE | Freq: Every morning | RESPIRATORY_TRACT | Status: DC
  Administered 2014-07-08: 18 ug via RESPIRATORY_TRACT
  Filled 2014-07-08: qty 5

## 2014-07-08 MED ORDER — HEPARIN (PORCINE) IN NACL 100-0.45 UNIT/ML-% IJ SOLN
1450.0000 [IU]/h | INTRAMUSCULAR | Status: DC
  Administered 2014-07-08: 1200 [IU]/h via INTRAVENOUS
  Filled 2014-07-08 (×2): qty 250

## 2014-07-08 MED ORDER — ALBUTEROL SULFATE (2.5 MG/3ML) 0.083% IN NEBU: 2.5000 mg | INHALATION_SOLUTION | Freq: Four times a day (QID) | RESPIRATORY_TRACT | Status: DC | PRN

## 2014-07-08 MED ORDER — ASPIRIN EC 325 MG PO TBEC
325.0000 mg | DELAYED_RELEASE_TABLET | Freq: Every day | ORAL | Status: DC
  Administered 2014-07-08: 325 mg via ORAL
  Filled 2014-07-08: qty 1

## 2014-07-08 MED ORDER — ONDANSETRON HCL 4 MG/2ML IJ SOLN
4.0000 mg | Freq: Four times a day (QID) | INTRAMUSCULAR | Status: DC | PRN
Start: 2014-07-08 — End: 2014-07-08

## 2014-07-08 MED ORDER — HYDROCODONE-ACETAMINOPHEN 5-325 MG PO TABS: 1.0000 | ORAL_TABLET | Freq: Two times a day (BID) | ORAL | Status: DC | PRN

## 2014-07-08 MED ORDER — NITROGLYCERIN 0.4 MG SL SUBL: 0.4000 mg | SUBLINGUAL_TABLET | SUBLINGUAL | Status: DC | PRN

## 2014-07-08 MED ORDER — METFORMIN HCL 500 MG PO TABS
500.0000 mg | ORAL_TABLET | Freq: Two times a day (BID) | ORAL | Status: DC
  Administered 2014-07-08: 500 mg via ORAL
  Filled 2014-07-08 (×3): qty 1

## 2014-07-08 MED ORDER — ATORVASTATIN CALCIUM 20 MG PO TABS
20.0000 mg | ORAL_TABLET | Freq: Every day | ORAL | Status: DC
  Filled 2014-07-08: qty 1

## 2014-07-08 MED ORDER — PANTOPRAZOLE SODIUM 40 MG PO TBEC
40.0000 mg | DELAYED_RELEASE_TABLET | Freq: Every day | ORAL | Status: DC
  Administered 2014-07-08: 40 mg via ORAL
  Filled 2014-07-08: qty 1

## 2014-07-08 MED ORDER — HYDROCODONE-ACETAMINOPHEN 5-325 MG PO TABS
1.0000 | ORAL_TABLET | ORAL | Status: DC | PRN
  Administered 2014-07-08: 2 via ORAL
  Filled 2014-07-08: qty 2

## 2014-07-08 MED ORDER — ALPRAZOLAM 0.25 MG PO TABS: 0.2500 mg | ORAL_TABLET | Freq: Two times a day (BID) | ORAL | Status: DC | PRN

## 2014-07-08 MED ORDER — ALBUTEROL SULFATE HFA 108 (90 BASE) MCG/ACT IN AERS: 2.0000 | INHALATION_SPRAY | Freq: Four times a day (QID) | RESPIRATORY_TRACT | Status: DC | PRN

## 2014-07-08 NOTE — Progress Notes (Signed)
ANTICOAGULATION CONSULT NOTE - Initial Consult  Pharmacy Consult for heparin Indication: CP in setting of CVA  Allergies  Allergen Reactions  . Tylox [Oxycodone-Acetaminophen] Hives and Other (See Comments)    Reaction: Tremors and diaphoresis     Patient Measurements: Height:  (172.7 cm) Weight: 240 lb (108.863 kg) IBW/kg (Calculated) : 68.4 Heparin Dosing Weight: 95kg  Vital Signs: Temp: 98.5 F (36.9 C) (09/27 0052) Temp src: Oral (09/27 0052) BP: 108/63 mmHg (09/27 0052) Pulse Rate: 59 (09/27 0052)  Labs:  Recent Labs  07/07/14 2053  HGB 14.8  HCT 42.5  PLT 243  CREATININE 0.99    Estimated Creatinine Clearance: 104.4 ml/min (by C-G formula based on Cr of 0.99).   Medical History: Past Medical History  Diagnosis Date  . Asthma     Last asthma attack at age 31; History of trach at 16 months  . COPD (chronic obstructive pulmonary disease) 06/2012    Dx by Zoe Lan, [facility]    Medications:  Prescriptions prior to admission  Medication Sig Dispense Refill  . acetaminophen (TYLENOL) 500 MG tablet Take 500 mg by mouth every 6 (six) hours as needed for headache.      . albuterol (PROVENTIL HFA;VENTOLIN HFA) 108 (90 BASE) MCG/ACT inhaler Inhale 2 puffs into the lungs every 6 (six) hours as needed for wheezing or shortness of breath.  1 Inhaler  2  . aspirin EC 325 MG tablet Take 325 mg by mouth daily.      [name] atorvastatin (LIPITOR) 20 MG tablet Take 1 tablet (20 mg total) by mouth daily at 6 PM.  30 tablet  0  . esomeprazole (NEXIUM) 40 MG capsule Take 40 mg by mouth daily at 12 noon.      [name] HYDROcodone-acetaminophen (NORCO/VICODIN) 5-325 MG per tablet Take 1-2 tablets by mouth every 4 (four) hours as needed for moderate pain.      . metFORMIN (GLUCOPHAGE) 500 MG tablet Take 1 tablet (500 mg total) by mouth 2 (two) times daily with a meal.  60 tablet  0  . ramipril (ALTACE) 2.5 MG capsule Take 2.5 mg by mouth daily.      [name]  tiotropium (SPIRIVA HANDIHALER) 18 MCG inhalation capsule Place 1 capsule (18 mcg total) into inhaler and inhale every morning.  30 capsule  12   Scheduled:  . aspirin EC  325 mg Oral Daily  . atorvastatin  20 mg Oral q1800  . metFORMIN  500 mg Oral BID WC  . pantoprazole  40 mg Oral Daily  . ramipril  2.5 mg Oral Daily  . tiotropium  18 mcg Inhalation q morning - 10a    Assessment: 53yo male admitted 9/23 for right basal ganglia infarct w/ thrombotic etiology, now with CP, initial troponin negative, to begin heparin.  Goal of Therapy:  Heparin level 0.3-0.5 units/ml Monitor platelets by anticoagulation protocol: Yes   Plan:  Will begin heparin gtt at 1200 units/hr (without bolus given CVA) and monitor heparin levels and CBC.  Vernard Gambles, PharmD, BCPS  07/08/2014,1:05 AM

## 2014-07-08 NOTE — H&P (Signed)
Referring Physician:  [name] is an 53 y.o. male.                       Chief Complaint: Chest pain  HPI: 53 year old male with recent stroke has right sided chest pain going across left side with EKG changes of inferior and lateral ischemia. No fever or cough.  Past Medical History  Diagnosis Date  . Asthma     Last asthma attack at age 23; History of trach at 16 months  . COPD (chronic obstructive pulmonary disease) 06/2012    Dx by [name], [facility]      Past Surgical History  Procedure Laterality Date  . Tracheostomy closure    . Tracheostomy    . Fracture surgery      NO HX of Broken Bone.  Has rotator cuff tear repair and shoulder/clavicle reconstruction  . Joint replacement      NO HX of JOINT REPLACEMENT.  Rotator cuff tear repair and shoulder reconstruction    Family History  Problem Relation Age of Onset  . Heart disease Mother     [name]/p 3V CABG  . Cancer Father 60    lung cancer; +tobacco  . Cancer Maternal Grandmother   . Heart disease Maternal Grandmother   . Cancer Paternal Grandmother   . Lupus Sister   . Multiple sclerosis Sister   . Anemia Daughter   . Stroke Father    Social History:  reports that he quit smoking about 5 weeks ago. His smoking use included Cigarettes. He started smoking about 39 years ago. He smoked 1.00 pack per day. He has never used smokeless tobacco. He reports that he drinks about 1.8 ounces of alcohol per week. He reports that he does not use illicit drugs.  Allergies:  Allergies  Allergen Reactions  . Tylox [Oxycodone-Acetaminophen] Hives and Other (See Comments)    Reaction: Tremors and diaphoresis      (Not in a hospital admission)  Results for orders placed during the hospital encounter of 07/07/14 (from the past 48 hour([name]))  CBC     Status: None   Collection Time    07/07/14  8:53 PM      Result Value Ref Range   WBC 8.1  4.0 - 10.5 K/uL   RBC 4.85  4.22 - 5.81 MIL/uL   Hemoglobin  14.8  13.0 - 17.0 g/dL   HCT 42.5  39.0 - 52.0 %   MCV 87.6  78.0 - 100.0 fL   MCH 30.5  26.0 - 34.0 pg   MCHC 34.8  30.0 - 36.0 g/dL   RDW 12.2  11.5 - 15.5 %   Platelets 243  150 - 400 K/uL  BASIC METABOLIC PANEL     Status: Abnormal   Collection Time    07/07/14  8:53 PM      Result Value Ref Range   Sodium 136 (*) 137 - 147 mEq/L   Potassium 4.3  3.7 - 5.3 mEq/L   Chloride 98  96 - 112 mEq/L   CO2 25  19 - 32 mEq/L   Glucose, Bld 110 (*) 70 - 99 mg/dL   BUN 16  6 - 23 mg/dL   Creatinine, Ser 0.99  0.50 - 1.35 mg/dL   Calcium 9.1  8.4 - 10.5 mg/dL   GFR calc non Af Amer >90  >90 mL/min   GFR calc Af Amer >90  >90 mL/min   Comment: (  NOTE)     The eGFR has been calculated using the CKD EPI equation.     This calculation has not been validated in all clinical situations.     eGFR'[name] persistently <90 mL/min signify possible Chronic Kidney     Disease.   Anion gap 13  5 - 15  HEPATIC FUNCTION PANEL     Status: None   Collection Time    07/07/14  8:53 PM      Result Value Ref Range   Total Protein 7.4  6.0 - 8.3 g/dL   Albumin 3.6  3.5 - 5.2 g/dL   AST 17  0 - 37 U/L   ALT 16  0 - 53 U/L   Alkaline Phosphatase 65  39 - 117 U/L   Total Bilirubin 0.4  0.3 - 1.2 mg/dL   Bilirubin, Direct <0.2  0.0 - 0.3 mg/dL   Indirect Bilirubin NOT CALCULATED  0.3 - 0.9 mg/dL  [name], ED     Status: None   Collection Time    07/07/14  8:58 PM      Result Value Ref Range   Troponin i, poc 0.00  0.00 - 0.08 ng/mL   Comment 3            Comment: Due to the release kinetics of cTnI,     a negative result within the first hours     of the onset of symptoms does not rule out     myocardial infarction with certainty.     If myocardial infarction is still suspected,     repeat the test at appropriate intervals.  URINALYSIS, ROUTINE W REFLEX MICROSCOPIC     Status: Abnormal   Collection Time    07/07/14  9:34 PM      Result Value Ref Range   Color, Urine AMBER (*) YELLOW   Comment:  BIOCHEMICALS MAY BE AFFECTED BY COLOR   APPearance CLEAR  CLEAR   Specific Gravity, Urine 1.031 (*) 1.005 - 1.030   pH 5.5  5.0 - 8.0   Glucose, UA NEGATIVE  NEGATIVE mg/dL   Hgb urine dipstick NEGATIVE  NEGATIVE   Bilirubin Urine NEGATIVE  NEGATIVE   Ketones, ur NEGATIVE  NEGATIVE mg/dL   Protein, ur NEGATIVE  NEGATIVE mg/dL   Urobilinogen, UA 1.0  0.0 - 1.0 mg/dL   Nitrite NEGATIVE  NEGATIVE   Leukocytes, UA NEGATIVE  NEGATIVE   Comment: MICROSCOPIC NOT DONE ON URINES WITH NEGATIVE PROTEIN, BLOOD, LEUKOCYTES, NITRITE, OR GLUCOSE <1000 mg/dL.   Dg Chest 2 View  07/07/2014   CLINICAL DATA:  Epigastric pain, chest pain  EXAM: CHEST  2 VIEW  COMPARISON:  07/06/2014  FINDINGS: The heart size and mediastinal contours are within normal limits. Both lungs are clear. The visualized skeletal structures are unremarkable.  IMPRESSION: No active cardiopulmonary disease.   Electronically Signed   By: Kathreen Devoid   On: 07/07/2014 22:51   Ct Head Wo Contrast  07/07/2014   CLINICAL DATA:  Severe headache, acute stroke  EXAM: CT HEAD WITHOUT CONTRAST  TECHNIQUE: Contiguous axial images were obtained from the base of the skull through the vertex without contrast.  COMPARISON:  07/04/2014  FINDINGS: Developing moderate sized hypodensity in right frontal lobe also involving the right basal ganglia, internal capsule, external capsule, and insular cortex. Findings are compatible with a known evolving acute right MCA territory infarct. No associated intracranial hemorrhage. Mild sulcal effacement and edema in the infarct region with slight effacement of the  right lateral ventricle. No significant midline shift or hydrocephalus. No acute intracranial hemorrhage. No cerebellar abnormality. Cisterns remain patent.  IMPRESSION: Evolving changes of a moderate size right MCA territory infarct with mild edema in the infarct zone and slight effacement of the right lateral ventricle compared to the prior study.  No acute  intracranial hemorrhage.   Electronically Signed   By: Daryll Brod M.D.   On: 07/07/2014 15:10   Dg Chest Port 1 View  07/06/2014   CLINICAL DATA:  Wheezing  EXAM: PORTABLE CHEST - 1 VIEW  COMPARISON:  None.  FINDINGS: There is no edema or consolidation. Heart is upper normal in size with pulmonary vascularity within normal limits. No adenopathy. No bone lesions.  IMPRESSION: No edema or consolidation.   Electronically Signed   By: Lowella Grip M.D.   On: 07/06/2014 07:59    Review Of Systems Constitutional: No fever, Appetite normal;  + fatigue.  HEENT: No blurry vision, no diplopia, no pharyngitis, no dysphagia  CV: + chest pain, no palpitations, no orthopnea, no edema.  Resp: + SOB, + cough, + COPD.  GI: No nausea, no vomiting, no diarrhea, no melena, no hematochezia, no constipation, no abdominal pain.  GU: No dysuria, no hematuria, no frequency, no urgency.  MSK: no myalgias, no arthralgias.  Neuro: No headache, no history of seizures.  Psych: No depression, no anxiety.  Endo: No heat intolerance, no cold intolerance, no polyuria, no polydipsia  Skin: No rashes, no skin lesions.  Travel history: No recent travel.   Blood pressure 111/57, pulse 63, temperature 98.7 F (37.1 C), resp. rate 20, SpO2 94.00%.  General appearance: alert, cooperative and no distress  Head: Normocephalic, without obvious abnormality, atraumatic  Eyes: negative  Nose: Nares normal. Septum midline. Mucosa normal. No drainage or sinus tenderness.  Neck: no JVD and supple, symmetrical, trachea midline  Lungs: clear to auscultation bilaterally  Heart: regular rate and rhythm, S1, S2 normal, no murmur, click, rub or gallop  Abdomen: soft, non-tender; bowel sounds normal; no masses, no organomegaly  Extremities: extremities normal, atraumatic, no cyanosis or edema  Pulses: 2+ and symmetric  Skin: Skin color, texture, turgor normal. No rashes or lesions  Neurologic: Thick speech, Mild left grip  weakness. Mental status: Alert, oriented, thought content appropriate    Assessment/Plan Chest pain Recent right basal ganglia infarct Severe right ICA occlusion COPD Asthma GERD Obesity  R/O MI. Home medications  Ivan Pasqua S, MD  07/08/2014, 12:10 AM

## 2014-07-08 NOTE — Progress Notes (Signed)
Patient ambulated 300 ft in hallway.  No c/o chest pain, but c/o pain to right hip, bottom of left foot and top of right foot.  Encouraged to ambulate frequently as tolerated.  Will continue to monitor.

## 2014-07-08 NOTE — Discharge Summary (Signed)
Physician Discharge Summary  Patient ID: [name] MRN: [id number] DOB/AGE: 01-08-1961 53 y.o.  Admit date: 07/07/2014 Discharge date: 07/08/2014  Admission Diagnoses: Chest pain  Recent right basal ganglia infarct  Severe right ICA occlusion  COPD  Asthma  GERD  Obesity  Discharge Diagnoses:  Principle Problem: * Chest pain at rest * Recent right basal ganglia infarct  Severe right ICA occlusion  COPD  Asthma  GERD  Obesity  Discharged Condition: fair  Hospital Course: 53 year old male with recent stroke has right sided chest pain going across left side with EKG changes of inferior and lateral ischemia. No fever or cough. He had normal cardiac enzymes x 3. He remained chest pain free without heparin and with activity. Due to recent stroke, patient was advised to get OP stress test in 2-3 months. He will be followed by Dr. Sharyn Lull in 1 month and by primary care Dr. Tracey Harries in 2 weeks. 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 Pearlean Brownie.  Consults: cardiology  Significant Diagnostic Studies: labs: Near normal CBC and BMET and cardiac enzymes x 3.  EKG SR with inferior ischemic change.  Treatments: cardiac meds: ramipril (Altace), aspirin and atorvastatin.  Discharge Exam: Blood pressure 94/56, pulse 57, temperature 97.6 F (36.4 C), temperature source Oral, resp. rate 20, height  (1.727 m), weight 108.863 kg (240 lb), SpO2 93.00%. General appearance: alert, cooperative and no distress  Head: Normocephalic, without obvious abnormality, atraumatic  Eyes: negative  Nose: Nares normal. Septum midline. Mucosa normal. No drainage or sinus tenderness.  Neck: no JVD and supple, symmetrical, trachea midline  Lungs: clear to auscultation bilaterally  Heart: regular rate and rhythm, S1, S2 normal, no murmur, click, rub or gallop  Abdomen: soft, non-tender; bowel sounds normal; no masses, no organomegaly  Extremities: extremities normal,  atraumatic, no cyanosis or edema  Pulses: 2+ and symmetric  Skin: Skin color, texture, turgor normal. No rashes or lesions  Neurologic: Thick speech, Mild left grip weakness.  Mental status: Alert, oriented.    Disposition: 01-Home or Self Care     Medication List         acetaminophen 500 MG tablet  Commonly known as:  TYLENOL  Take 500 mg by mouth every 6 (six) hours as needed for headache.     albuterol 108 (90 BASE) MCG/ACT inhaler  Commonly known as:  PROVENTIL HFA;VENTOLIN HFA  Inhale 2 puffs into the lungs every 6 (six) hours as needed for wheezing or shortness of breath.     aspirin EC 325 MG tablet  Take 325 mg by mouth daily.     atorvastatin 20 MG tablet  Commonly known as:  LIPITOR  Take 1 tablet (20 mg total) by mouth daily at 6 PM.     esomeprazole 40 MG capsule  Commonly known as:  NEXIUM  Take 40 mg by mouth daily at 12 noon.     HYDROcodone-acetaminophen 5-325 MG per tablet  Commonly known as:  NORCO/VICODIN  Take 1 tablet by mouth 2 (two) times daily as needed for moderate pain.     metFORMIN 500 MG tablet  Commonly known as:  GLUCOPHAGE  Take 1 tablet (500 mg total) by mouth 2 (two) times daily with a meal.     nitroGLYCERIN 0.4 MG SL tablet  Commonly known as:  NITROSTAT  Place 1 tablet (0.4 mg total) under the tongue every 5 (five) minutes x 3 doses as needed for chest pain.  ramipril 2.5 MG capsule  Commonly known as:  ALTACE  Take 2.5 mg by mouth daily.     tiotropium 18 MCG inhalation capsule  Commonly known as:  SPIRIVA HANDIHALER  Place 1 capsule (18 mcg total) into inhaler and inhale every morning.           Follow-up Information   Follow up with BOUSKA,[name], MD. Schedule an appointment as soon as possible for a visit in 2 weeks.   Specialty:  Family Medicine   Contact information:   [address] [place] [address] [phone]       Follow up with Robynn Pane, MD In 1 month.   Specialty:  Cardiology    Contact information:   [address] [place] [address] [phone]       Signed: Ricki Rodriguez 07/08/2014, 4:34 PM

## 2014-07-08 NOTE — ED Provider Notes (Signed)
This patient was seen in conjunction with the resident physician, Dr. Lafayette Dragon.  The documentation accurately reflects the patient's evaluation here in the ED.  On my exam, patient is awake, alert, in no distress. He had no active pain, but with his description of sharp, severe pain, as well as his new EKG changes, we discussed his case with cardiology team for admission. Notably, the patient was recently here for CVA, notes that his condition in general, in those regards have improved.  EKG had sinus bradycardia, rate 58, T wave inversions, laterally abnormal    Gerhard Munch, MD 07/08/14 1622

## 2014-07-10 ENCOUNTER — Other Ambulatory Visit (HOSPITAL_COMMUNITY): Payer: Self-pay | Admitting: Interventional Radiology

## 2014-07-10 DIAGNOSIS — I729 Aneurysm of unspecified site: Secondary | ICD-10-CM

## 2014-07-11 ENCOUNTER — Encounter (INDEPENDENT_AMBULATORY_CARE_PROVIDER_SITE_OTHER): Payer: Self-pay

## 2014-07-11 ENCOUNTER — Encounter (INDEPENDENT_AMBULATORY_CARE_PROVIDER_SITE_OTHER): Payer: MEDICAID

## 2014-07-11 DIAGNOSIS — Z0289 Encounter for other administrative examinations: Secondary | ICD-10-CM

## 2014-07-13 ENCOUNTER — Ambulatory Visit (HOSPITAL_COMMUNITY)
Admission: RE | Admit: 2014-07-13 | Discharge: 2014-07-13 | Disposition: A | Discharge status: Home / Self Care | Payer: BC Managed Care – PPO | Source: Ambulatory Visit | Attending: Interventional Radiology | Admitting: Interventional Radiology

## 2014-07-13 DIAGNOSIS — I729 Aneurysm of unspecified site: Secondary | ICD-10-CM

## 2014-07-16 ENCOUNTER — Telehealth (HOSPITAL_COMMUNITY): Payer: Self-pay | Admitting: Interventional Radiology

## 2014-07-16 NOTE — Telephone Encounter (Signed)
Called pt, left VM to call me back. JM

## 2014-07-17 ENCOUNTER — Encounter: Payer: Medicaid Other | Attending: Family Medicine

## 2014-07-17 VITALS — Ht 68.0 in | Wt 241.4 lb

## 2014-07-17 DIAGNOSIS — E1151 Type 2 diabetes mellitus with diabetic peripheral angiopathy without gangrene: Secondary | ICD-10-CM | POA: Insufficient documentation

## 2014-07-17 DIAGNOSIS — Z713 Dietary counseling and surveillance: Secondary | ICD-10-CM | POA: Diagnosis not present

## 2014-07-17 DIAGNOSIS — E1159 Type 2 diabetes mellitus with other circulatory complications: Secondary | ICD-10-CM

## 2014-07-18 NOTE — Progress Notes (Signed)
Patient was seen on 10/6/15for the first of a series of three diabetes self-management courses at the [facility].  Patient Education Plan per assessed needs and concerns is to attend four course education program for Diabetes Self Management Education.  The following learning objectives were met by the patient during this class:  Describe diabetes  State some common risk factors for diabetes  Defines the role of glucose and insulin  Identifies type of diabetes and pathophysiology  Describe the relationship between diabetes and cardiovascular risk  State the members of the Healthcare Team  States the rationale for glucose monitoring  State when to test glucose  State their individual Target Range  State the importance of logging glucose readings  Describe how to interpret glucose readings  Identifies A1C target  Explain the correlation between A1c and eAG values  State symptoms and treatment of high blood glucose  State symptoms and treatment of low blood glucose  Explain proper technique for glucose testing  Identifies proper sharps disposal  Handouts given during class include:  Living Well with Diabetes book  Carb Counting and Meal Planning book  Meal Plan Card  Carbohydrate guide  Meal planning worksheet  Low Sodium Flavoring Tips  The diabetes portion plate  L9K to eAG Conversion Chart  Diabetes Medications  Diabetes Recommended Care Schedule  Support Group  Diabetes Success Plan  Core Class Satisfaction Survey  Follow-Up Plan:  Attend core 2

## 2014-07-19 ENCOUNTER — Telehealth (HOSPITAL_COMMUNITY): Payer: Self-pay | Admitting: Interventional Radiology

## 2014-07-19 NOTE — Telephone Encounter (Signed)
Returned pt's call. He states that he will come off of the trial drug that Dr. Pearlean BrownieSethi has him on 7 days prior to the date that his procedure is scheduled on. I told him that I would get this set up and was looking at scheduling him on 08/08/14. He was in agreement with this plan. JM

## 2014-07-24 ENCOUNTER — Other Ambulatory Visit (HOSPITAL_COMMUNITY): Payer: Self-pay | Admitting: Interventional Radiology

## 2014-07-24 DIAGNOSIS — I729 Aneurysm of unspecified site: Secondary | ICD-10-CM

## 2014-07-24 DIAGNOSIS — I639 Cerebral infarction, unspecified: Secondary | ICD-10-CM

## 2014-07-24 DIAGNOSIS — E1159 Type 2 diabetes mellitus with other circulatory complications: Secondary | ICD-10-CM

## 2014-07-24 DIAGNOSIS — E1151 Type 2 diabetes mellitus with diabetic peripheral angiopathy without gangrene: Secondary | ICD-10-CM | POA: Diagnosis not present

## 2014-07-24 NOTE — Progress Notes (Signed)

## 2014-07-31 DIAGNOSIS — E119 Type 2 diabetes mellitus without complications: Secondary | ICD-10-CM

## 2014-07-31 DIAGNOSIS — E1151 Type 2 diabetes mellitus with diabetic peripheral angiopathy without gangrene: Secondary | ICD-10-CM | POA: Diagnosis not present

## 2014-07-31 NOTE — Progress Notes (Signed)
Patient was seen on 07/31/14 for the third of a series of three diabetes self-management courses at the [facility]. The following learning objectives were met by the patient during this class:    State the amount of activity recommended for healthy living   Describe activities suitable for individual needs   Identify ways to regularly incorporate activity into daily life   Identify barriers to activity and ways to over come these barriers  Identify diabetes medications being personally used and their primary action for lowering glucose and possible side effects   Describe role of stress on blood glucose and develop strategies to address psychosocial issues   Identify diabetes complications and ways to prevent them  Explain how to manage diabetes during illness   Evaluate success in meeting personal goal   Establish 2-3 goals that they will plan to diligently work on until they return for the  94-monthfollow-up visit  Goals:   I will count my carb choices at most meals and snacks  I will be active 15 minutes or more 5 times a week  I will take my diabetes medications as scheduled  I will test my glucose at least 2 times a day, 5 days a week  I will look at patterns in my record book at least 2 days a month  To help manage stress I will  Be active at least 2 times a week  Your patient has identified these potential barriers to change:  Motivation Finances Stress Lack of Family Support  Your patient has identified their diabetes self-care support plan as  [facility]  Plan:  Attend Core 4 in 4 months

## 2014-08-02 ENCOUNTER — Encounter (HOSPITAL_COMMUNITY): Payer: Self-pay

## 2014-08-02 ENCOUNTER — Encounter (HOSPITAL_COMMUNITY): Payer: Self-pay | Admitting: Pharmacy Technician

## 2014-08-02 ENCOUNTER — Encounter (HOSPITAL_COMMUNITY)
Admission: RE | Admit: 2014-08-02 | Discharge: 2014-08-02 | Disposition: A | Discharge status: Home / Self Care | Payer: Medicaid Other | Source: Ambulatory Visit | Attending: Interventional Radiology | Admitting: Interventional Radiology

## 2014-08-02 ENCOUNTER — Other Ambulatory Visit (HOSPITAL_COMMUNITY): Payer: Self-pay | Admitting: Internal Medicine

## 2014-08-02 ENCOUNTER — Other Ambulatory Visit: Payer: Self-pay | Admitting: Radiology

## 2014-08-02 DIAGNOSIS — Z01812 Encounter for preprocedural laboratory examination: Secondary | ICD-10-CM | POA: Diagnosis not present

## 2014-08-02 HISTORY — DX: Essential (primary) hypertension: I10

## 2014-08-02 HISTORY — DX: Gastro-esophageal reflux disease without esophagitis: K21.9

## 2014-08-02 HISTORY — DX: Personal history of other diseases of the digestive system: Z87.19

## 2014-08-02 LAB — CBC WITH DIFFERENTIAL/PLATELET
BASOS PCT: 0 % (ref 0–1)
Basophils Absolute: 0 10*3/uL (ref 0.0–0.1)
Eosinophils Absolute: 0.1 10*3/uL (ref 0.0–0.7)
Eosinophils Relative: 2 % (ref 0–5)
HCT: 38.5 % — ABNORMAL LOW (ref 39.0–52.0)
HEMOGLOBIN: 13.3 g/dL (ref 13.0–17.0)
LYMPHS ABS: 2.1 10*3/uL (ref 0.7–4.0)
Lymphocytes Relative: 29 % (ref 12–46)
MCH: 29.8 pg (ref 26.0–34.0)
MCHC: 34.5 g/dL (ref 30.0–36.0)
MCV: 86.1 fL (ref 78.0–100.0)
MONO ABS: 0.7 10*3/uL (ref 0.1–1.0)
MONOS PCT: 10 % (ref 3–12)
NEUTROS ABS: 4.3 10*3/uL (ref 1.7–7.7)
Neutrophils Relative %: 59 % (ref 43–77)
Platelets: 197 10*3/uL (ref 150–400)
RBC: 4.47 MIL/uL (ref 4.22–5.81)
RDW: 12.3 % (ref 11.5–15.5)
WBC: 7.3 10*3/uL (ref 4.0–10.5)

## 2014-08-02 LAB — APTT: APTT: 29 s (ref 24–37)

## 2014-08-02 LAB — COMPREHENSIVE METABOLIC PANEL
ALBUMIN: 3.6 g/dL (ref 3.5–5.2)
ALK PHOS: 61 U/L (ref 39–117)
ALT: 18 U/L (ref 0–53)
AST: 15 U/L (ref 0–37)
Anion gap: 10 (ref 5–15)
BUN: 12 mg/dL (ref 6–23)
CHLORIDE: 103 meq/L (ref 96–112)
CO2: 28 meq/L (ref 19–32)
CREATININE: 0.98 mg/dL (ref 0.50–1.35)
Calcium: 9.2 mg/dL (ref 8.4–10.5)
GLUCOSE: 111 mg/dL — AB (ref 70–99)
POTASSIUM: 3.9 meq/L (ref 3.7–5.3)
Sodium: 141 mEq/L (ref 137–147)
Total Bilirubin: <0.2 mg/dL — ABNORMAL LOW (ref 0.3–1.2)
Total Protein: 6.9 g/dL (ref 6.0–8.3)

## 2014-08-02 LAB — PROTIME-INR
INR: 1.05 (ref 0.00–1.49)
PROTHROMBIN TIME: 13.8 s (ref 11.6–15.2)

## 2014-08-03 ENCOUNTER — Other Ambulatory Visit: Payer: Self-pay | Admitting: Radiology

## 2014-08-07 ENCOUNTER — Other Ambulatory Visit: Payer: Self-pay | Admitting: Radiology

## 2014-08-08 ENCOUNTER — Inpatient Hospital Stay (HOSPITAL_COMMUNITY)
Admission: RE | Admit: 2014-08-08 | Discharge: 2014-08-09 | DRG: 027 | Disposition: A | Discharge status: Home / Self Care | Payer: Medicaid Other | Source: Ambulatory Visit | Attending: Interventional Radiology | Admitting: Interventional Radiology

## 2014-08-08 ENCOUNTER — Ambulatory Visit (HOSPITAL_COMMUNITY): Payer: Medicaid Other | Admitting: Certified Registered"

## 2014-08-08 ENCOUNTER — Encounter (HOSPITAL_COMMUNITY)
Admission: RE | Disposition: A | Discharge status: Home / Self Care | Payer: Self-pay | Source: Ambulatory Visit | Attending: Interventional Radiology

## 2014-08-08 ENCOUNTER — Ambulatory Visit (HOSPITAL_COMMUNITY)
Admission: RE | Admit: 2014-08-08 | Discharge: 2014-08-08 | Disposition: A | Discharge status: Home / Self Care | Payer: Medicaid Other | Source: Ambulatory Visit | Attending: Interventional Radiology | Admitting: Interventional Radiology

## 2014-08-08 ENCOUNTER — Encounter (HOSPITAL_COMMUNITY): Payer: Self-pay | Admitting: Certified Registered Nurse Anesthetist

## 2014-08-08 ENCOUNTER — Encounter (HOSPITAL_COMMUNITY): Payer: Self-pay

## 2014-08-08 ENCOUNTER — Encounter (HOSPITAL_COMMUNITY): Payer: Medicaid Other | Admitting: Certified Registered"

## 2014-08-08 VITALS — BP 119/73 | HR 66 | Temp 98.2°F | Resp 20

## 2014-08-08 DIAGNOSIS — Z79899 Other long term (current) drug therapy: Secondary | ICD-10-CM | POA: Diagnosis not present

## 2014-08-08 DIAGNOSIS — J449 Chronic obstructive pulmonary disease, unspecified: Secondary | ICD-10-CM

## 2014-08-08 DIAGNOSIS — Z7982 Long term (current) use of aspirin: Secondary | ICD-10-CM | POA: Diagnosis not present

## 2014-08-08 DIAGNOSIS — I6521 Occlusion and stenosis of right carotid artery: Secondary | ICD-10-CM

## 2014-08-08 DIAGNOSIS — Z87891 Personal history of nicotine dependence: Secondary | ICD-10-CM

## 2014-08-08 DIAGNOSIS — K219 Gastro-esophageal reflux disease without esophagitis: Secondary | ICD-10-CM | POA: Insufficient documentation

## 2014-08-08 DIAGNOSIS — I10 Essential (primary) hypertension: Secondary | ICD-10-CM | POA: Insufficient documentation

## 2014-08-08 DIAGNOSIS — Z8673 Personal history of transient ischemic attack (TIA), and cerebral infarction without residual deficits: Secondary | ICD-10-CM

## 2014-08-08 DIAGNOSIS — Z7902 Long term (current) use of antithrombotics/antiplatelets: Secondary | ICD-10-CM

## 2014-08-08 DIAGNOSIS — E119 Type 2 diabetes mellitus without complications: Secondary | ICD-10-CM

## 2014-08-08 DIAGNOSIS — I729 Aneurysm of unspecified site: Secondary | ICD-10-CM

## 2014-08-08 DIAGNOSIS — I671 Cerebral aneurysm, nonruptured: Secondary | ICD-10-CM | POA: Diagnosis not present

## 2014-08-08 DIAGNOSIS — I639 Cerebral infarction, unspecified: Secondary | ICD-10-CM

## 2014-08-08 HISTORY — PX: RADIOLOGY WITH ANESTHESIA: SHX6223

## 2014-08-08 LAB — PLATELET INHIBITION P2Y12: Platelet Function  P2Y12: 37 [PRU] — ABNORMAL LOW (ref 194–418)

## 2014-08-08 LAB — POCT ACTIVATED CLOTTING TIME
ACTIVATED CLOTTING TIME: 152 s
ACTIVATED CLOTTING TIME: 135 s
Activated Clotting Time: 146 seconds

## 2014-08-08 LAB — GLUCOSE, CAPILLARY
GLUCOSE-CAPILLARY: 116 mg/dL — AB (ref 70–99)
Glucose-Capillary: 115 mg/dL — ABNORMAL HIGH (ref 70–99)
Glucose-Capillary: 115 mg/dL — ABNORMAL HIGH (ref 70–99)

## 2014-08-08 LAB — HEPARIN LEVEL (UNFRACTIONATED)

## 2014-08-08 SURGERY — RADIOLOGY WITH ANESTHESIA
Anesthesia: Monitor Anesthesia Care

## 2014-08-08 MED ORDER — RAMIPRIL 2.5 MG PO CAPS
2.5000 mg | ORAL_CAPSULE | Freq: Every day | ORAL | Status: DC
  Administered 2014-08-09: 2.5 mg via ORAL
  Filled 2014-08-08: qty 1

## 2014-08-08 MED ORDER — HYDROMORPHONE HCL 1 MG/ML IJ SOLN: 0.2500 mg | INTRAMUSCULAR | Status: DC | PRN

## 2014-08-08 MED ORDER — NICARDIPINE HCL IN NACL 20-0.86 MG/200ML-% IV SOLN
5.0000 mg/h | INTRAVENOUS | Status: DC
Start: 2014-08-08 — End: 2014-08-09

## 2014-08-08 MED ORDER — FENTANYL CITRATE 0.05 MG/ML IJ SOLN
INTRAMUSCULAR | Status: DC | PRN
  Administered 2014-08-08 (×2): 50 ug via INTRAVENOUS
  Administered 2014-08-08: 100 ug via INTRAVENOUS

## 2014-08-08 MED ORDER — ATORVASTATIN CALCIUM 20 MG PO TABS
20.0000 mg | ORAL_TABLET | Freq: Every day | ORAL | Status: DC
  Administered 2014-08-08: 20 mg via ORAL
  Filled 2014-08-08 (×2): qty 1

## 2014-08-08 MED ORDER — HEPARIN (PORCINE) IN NACL 100-0.45 UNIT/ML-% IJ SOLN
1000.0000 [IU]/h | INTRAMUSCULAR | Status: DC
  Administered 2014-08-08: 500 [IU]/h via INTRAVENOUS
  Filled 2014-08-08 (×2): qty 250

## 2014-08-08 MED ORDER — ASPIRIN 81 MG PO CHEW
CHEWABLE_TABLET | ORAL | Status: AC
  Filled 2014-08-08: qty 3

## 2014-08-08 MED ORDER — NITROGLYCERIN 1 MG/10 ML FOR IR/CATH LAB
INTRA_ARTERIAL | Status: AC
  Filled 2014-08-08: qty 10

## 2014-08-08 MED ORDER — KETOROLAC TROMETHAMINE 30 MG/ML IJ SOLN
30.0000 mg | Freq: Four times a day (QID) | INTRAMUSCULAR | Status: DC | PRN
  Administered 2014-08-09: 30 mg via INTRAVENOUS
  Filled 2014-08-08: qty 1

## 2014-08-08 MED ORDER — CEFAZOLIN SODIUM-DEXTROSE 2-3 GM-% IV SOLR
INTRAVENOUS | Status: AC
  Filled 2014-08-08: qty 50

## 2014-08-08 MED ORDER — ALBUTEROL SULFATE (2.5 MG/3ML) 0.083% IN NEBU: 2.5000 mg | INHALATION_SOLUTION | Freq: Four times a day (QID) | RESPIRATORY_TRACT | Status: DC | PRN

## 2014-08-08 MED ORDER — NEOSTIGMINE METHYLSULFATE 10 MG/10ML IV SOLN
INTRAVENOUS | Status: DC | PRN
  Administered 2014-08-08: 5 mg via INTRAVENOUS

## 2014-08-08 MED ORDER — CLOPIDOGREL BISULFATE 75 MG PO TABS: 75.0000 mg | ORAL_TABLET | ORAL | Status: DC

## 2014-08-08 MED ORDER — ONDANSETRON HCL 4 MG/2ML IJ SOLN: 4.0000 mg | Freq: Four times a day (QID) | INTRAMUSCULAR | Status: DC | PRN

## 2014-08-08 MED ORDER — EPHEDRINE SULFATE 50 MG/ML IJ SOLN
INTRAMUSCULAR | Status: DC | PRN
  Administered 2014-08-08: 5 mg via INTRAVENOUS

## 2014-08-08 MED ORDER — TIOTROPIUM BROMIDE MONOHYDRATE 18 MCG IN CAPS
18.0000 ug | ORAL_CAPSULE | Freq: Every morning | RESPIRATORY_TRACT | Status: DC
  Administered 2014-08-09: 18 ug via RESPIRATORY_TRACT
  Filled 2014-08-08: qty 5

## 2014-08-08 MED ORDER — GLYCOPYRROLATE 0.2 MG/ML IJ SOLN
INTRAMUSCULAR | Status: DC | PRN
  Administered 2014-08-08: .8 mg via INTRAVENOUS

## 2014-08-08 MED ORDER — PHENYLEPHRINE HCL 10 MG/ML IJ SOLN
10.0000 mg | INTRAVENOUS | Status: DC | PRN
  Administered 2014-08-08: 15 ug/min via INTRAVENOUS

## 2014-08-08 MED ORDER — LACTATED RINGERS IV SOLN: INTRAVENOUS | Status: DC | PRN

## 2014-08-08 MED ORDER — CEFAZOLIN SODIUM-DEXTROSE 2-3 GM-% IV SOLR
2.0000 g | Freq: Once | INTRAVENOUS | Status: AC
  Administered 2014-08-08: 2 g via INTRAVENOUS

## 2014-08-08 MED ORDER — SUCCINYLCHOLINE CHLORIDE 20 MG/ML IJ SOLN
INTRAMUSCULAR | Status: DC | PRN
  Administered 2014-08-08: 80 mg via INTRAVENOUS

## 2014-08-08 MED ORDER — HEPARIN SODIUM (PORCINE) 1000 UNIT/ML IJ SOLN
INTRAMUSCULAR | Status: DC | PRN
  Administered 2014-08-08 (×2): 500 [IU] via INTRAVENOUS
  Administered 2014-08-08: 1000 [IU] via INTRAVENOUS
  Administered 2014-08-08: 500 [IU] via INTRAVENOUS
  Administered 2014-08-08: 2000 [IU] via INTRAVENOUS
  Administered 2014-08-08: 500 [IU] via INTRAVENOUS

## 2014-08-08 MED ORDER — NITROGLYCERIN 0.4 MG SL SUBL: 0.4000 mg | SUBLINGUAL_TABLET | SUBLINGUAL | Status: DC | PRN

## 2014-08-08 MED ORDER — VECURONIUM BROMIDE 10 MG IV SOLR
INTRAVENOUS | Status: DC | PRN
  Administered 2014-08-08 (×2): 2 mg via INTRAVENOUS

## 2014-08-08 MED ORDER — ONDANSETRON HCL 4 MG/2ML IJ SOLN
INTRAMUSCULAR | Status: DC | PRN
  Administered 2014-08-08: 4 mg via INTRAVENOUS

## 2014-08-08 MED ORDER — ASPIRIN EC 325 MG PO TBEC: 325.0000 mg | DELAYED_RELEASE_TABLET | ORAL | Status: DC

## 2014-08-08 MED ORDER — LIDOCAINE HCL (CARDIAC) 20 MG/ML IV SOLN
INTRAVENOUS | Status: DC | PRN
  Administered 2014-08-08: 80 mg via INTRAVENOUS

## 2014-08-08 MED ORDER — CLOPIDOGREL BISULFATE 75 MG PO TABS
75.0000 mg | ORAL_TABLET | Freq: Every day | ORAL | Status: DC
  Administered 2014-08-09: 75 mg via ORAL
  Filled 2014-08-08 (×2): qty 1

## 2014-08-08 MED ORDER — NIMODIPINE 30 MG PO CAPS: 60.0000 mg | ORAL_CAPSULE | ORAL | Status: DC

## 2014-08-08 MED ORDER — HEPARIN (PORCINE) IN NACL 100-0.45 UNIT/ML-% IJ SOLN
INTRAMUSCULAR | Status: AC
  Filled 2014-08-08: qty 250

## 2014-08-08 MED ORDER — ASPIRIN 81 MG PO CHEW
CHEWABLE_TABLET | ORAL | Status: AC
  Administered 2014-08-08: 243 mg
  Filled 2014-08-08: qty 1

## 2014-08-08 MED ORDER — SODIUM CHLORIDE 0.9 % IV SOLN
INTRAVENOUS | Status: DC | PRN
  Administered 2014-08-08 (×2): via INTRAVENOUS

## 2014-08-08 MED ORDER — ROCURONIUM BROMIDE 100 MG/10ML IV SOLN
INTRAVENOUS | Status: DC | PRN
  Administered 2014-08-08: 20 mg via INTRAVENOUS
  Administered 2014-08-08: 30 mg via INTRAVENOUS

## 2014-08-08 MED ORDER — EPTIFIBATIDE 2 MG/ML IV SOLN
2.0000 mg | Freq: Once | INTRAVENOUS | Status: AC
  Administered 2014-08-08: 2 mg

## 2014-08-08 MED ORDER — ONDANSETRON HCL 4 MG/2ML IJ SOLN: 4.0000 mg | Freq: Once | INTRAMUSCULAR | Status: DC | PRN

## 2014-08-08 MED ORDER — INSULIN ASPART 100 UNIT/ML ~~LOC~~ SOLN
0.0000 [IU] | Freq: Three times a day (TID) | SUBCUTANEOUS | Status: DC
Start: 2014-08-08 — End: 2014-08-09

## 2014-08-08 MED ORDER — LACTATED RINGERS IV SOLN
INTRAVENOUS | Status: DC | PRN
  Administered 2014-08-08: 08:00:00 via INTRAVENOUS

## 2014-08-08 MED ORDER — ASPIRIN 325 MG PO TABS
325.0000 mg | ORAL_TABLET | Freq: Every day | ORAL | Status: DC
  Administered 2014-08-09: 325 mg via ORAL
  Filled 2014-08-08 (×2): qty 1

## 2014-08-08 MED ORDER — PROPOFOL 10 MG/ML IV BOLUS
INTRAVENOUS | Status: DC | PRN
  Administered 2014-08-08: 150 mg via INTRAVENOUS

## 2014-08-08 MED ORDER — LIDOCAINE HCL 1 % IJ SOLN
INTRAMUSCULAR | Status: AC
  Filled 2014-08-08: qty 20

## 2014-08-08 MED ORDER — KETOROLAC TROMETHAMINE 30 MG/ML IJ SOLN: 30.0000 mg | Freq: Four times a day (QID) | INTRAMUSCULAR | Status: DC

## 2014-08-08 MED ORDER — IOHEXOL 300 MG/ML  SOLN
150.0000 mL | Freq: Once | INTRAMUSCULAR | Status: AC | PRN
  Administered 2014-08-08: 150 mL via INTRAVENOUS

## 2014-08-08 MED ORDER — SODIUM CHLORIDE 0.9 % IV SOLN
INTRAVENOUS | Status: DC
  Administered 2014-08-08 (×2): via INTRAVENOUS

## 2014-08-08 NOTE — H&P (Signed)
Chief Complaint: Hx recent CVA New unruptured L Middle cerebral artery aneurysm  Referring Physician(s): Latif Nazareno K  History of Present Illness: [name] is a 53 y.o. male  Suffered stroke 06/2014 Noted Cerebral angiogram at that time revealed occlusion of R internal carotid artery Doing well post CVA with medical management Also seen on arteriogram was L middle cerebral artery unruptured aneurysm Was consulted with Dr [name] 07/13/14 Now scheduled for cerebral arteriogram with possible embolization of same Denies any headache; vision or speech changes No numbness or tingling    Past Medical History  Diagnosis Date  . Asthma     Last asthma attack at age 47; History of trach at 16 months  . COPD (chronic obstructive pulmonary disease) 06/2012    Dx by Zoe Lan, [facility]  . Headache(784.0)   . Diabetes mellitus without complication   . Stroke     2015  . Hypertension   . GERD (gastroesophageal reflux disease)   . H/O hiatal hernia     Past Surgical History  Procedure Laterality Date  . Tracheostomy closure    . Tracheostomy    . Fracture surgery      NO HX of Broken Bone.  Has rotator cuff tear repair and shoulder/clavicle reconstruction  . Joint replacement      NO HX of JOINT REPLACEMENT.  Rotator cuff tear repair and shoulder reconstruction  . Rectal surgery    . Rotator cuff repair      Allergies: Tylox  Medications: Prior to Admission medications   Medication Sig Start Date End Date Taking? Authorizing Provider  albuterol (PROVENTIL HFA;VENTOLIN HFA) 108 (90 BASE) MCG/ACT inhaler Inhale 1 puff into the lungs every 6 (six) hours as needed for wheezing or shortness of breath.   Yes Historical Provider, MD  aspirin EC 81 MG tablet Take 81 mg by mouth daily.   Yes Historical Provider, MD  atorvastatin (LIPITOR) 20 MG tablet Take 1 tablet (20 mg total) by mouth daily at 6 PM. 07/06/14  Yes Calvert Cantor, MD    clopidogrel (PLAVIX) 75 MG tablet Take 75 mg by mouth daily.   Yes Historical Provider, MD  HYDROcodone-acetaminophen (NORCO/VICODIN) 5-325 MG per tablet Take 1 tablet by mouth 2 (two) times daily as needed for moderate pain. 07/08/14  Yes Ricki Rodriguez, MD  metFORMIN (GLUCOPHAGE) 500 MG tablet Take 1 tablet (500 mg total) by mouth 2 (two) times daily with a meal. 07/06/14  Yes Calvert Cantor, MD  nitroGLYCERIN (NITROSTAT) 0.4 MG SL tablet Place 1 tablet (0.4 mg total) under the tongue every 5 (five) minutes x 3 doses as needed for chest pain. 07/08/14  Yes Ricki Rodriguez, MD  ramipril (ALTACE) 2.5 MG capsule Take 2.5 mg by mouth daily.   Yes Historical Provider, MD  STUDY MEDICATION Take 1 tablet by mouth daily. "Acetylsalicylic"   Yes Historical Provider, MD  STUDY MEDICATION Take 1 tablet by mouth 2 (two) times daily.   Yes Historical Provider, MD  tiotropium (SPIRIVA HANDIHALER) 18 MCG inhalation capsule Place 1 capsule (18 mcg total) into inhaler and inhale every morning. 07/06/14  Yes Calvert Cantor, MD    Family History  Problem Relation Age of Onset  . Heart disease Mother     s/p 3V CABG  . Cancer Father 47    lung cancer; +tobacco  . Cancer Maternal Grandmother   . Heart disease Maternal Grandmother   . Cancer Paternal Grandmother   . Lupus Sister   .  Multiple sclerosis Sister   . Anemia Daughter   . Stroke Father     History   Social History  . Marital Status: Married    Spouse Name: not together since 2007    Number of Children: 3  . Years of Education: 10   Occupational History  . Engineer, productionield Technician     [employer]    Social History Main Topics  . Smoking status: Former Smoker -- 1.00 packs/day    Types: Cigarettes    Start date: 01/15/1975    Quit date: 06/03/2014  . Smokeless tobacco: Never Used     Comment: down from 2 ppd; has used an e-cigarette  . Alcohol Use: 1.8 oz/week    3 Cans of beer per week  . Drug Use: No  . Sexual Activity: Yes    Partners: Female      Comment: partner has had surgery to prevent pregnancy   Other Topics Concern  . None   Social History Narrative   Lives with his son and his mother.  His son has no contact with the son's mother, though the patient is not legally separated from her.  She has a history of drug use and has been in prison several times.    Review of Systems: A 12 point ROS discussed and pertinent positives are indicated in the HPI above.  All other systems are negative.  Review of Systems  Constitutional: Negative for fever, activity change, appetite change and unexpected weight change.  HENT: Negative for hearing loss.   Respiratory: Negative for cough and shortness of breath.   Cardiovascular: Negative for chest pain.  Gastrointestinal: Negative for abdominal pain.  Genitourinary: Negative for difficulty urinating.  Musculoskeletal: Negative for gait problem, neck pain and neck stiffness.  Skin: Negative for color change.  Neurological: Negative for dizziness, tremors, seizures, syncope, facial asymmetry, speech difficulty, weakness, light-headedness, numbness and headaches.  Psychiatric/Behavioral: Negative for behavioral problems, confusion and agitation.    Vital Signs: BP 119/73  Pulse 66  Temp(Src) 98.2 F (36.8 C) (Oral)  Resp 20  SpO2 97%  Physical Exam  Constitutional: He is oriented to person, place, and time. He appears well-developed and well-nourished.  Eyes: EOM are normal.  Neck: Normal range of motion. Neck supple.  Cardiovascular: Normal rate, regular rhythm and normal heart sounds.   No murmur heard. Pulmonary/Chest: Effort normal. He has wheezes.  Abdominal: Soft. Bowel sounds are normal. There is no tenderness.  Musculoskeletal: Normal range of motion.  Neurological: He is alert and oriented to person, place, and time.  Skin: Skin is warm and dry.  Psychiatric: He has a normal mood and affect. His behavior is normal. Judgment and thought content normal.    Imaging: Ir  Radiologist Eval & Mgmt  07/16/2014   EXAM: ESTABLISHED PATIENT OFFICE VISIT  CHIEF COMPLAINT: Left-sided hemiparesis with dysarthria. Discovery of intracranial aneurysm.  Current Pain Level: 1-10  HISTORY OF PRESENT ILLNESS: The patient is a 53 year old right-handed gentleman who was admitted recently for right middle cerebral artery distribution ischemic stroke approximately one week ago. 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 Reconstitution of the right middle cerebral artery and the right anterior cerebral artery were seen from the posterior and the anterior circulation via the anterior communicating artery.  Also seen was a nonocclusive clot in one of the divisions. The patient made a prominent recovery and was discharged.  He  was then admitted with chest pains which ruled out an acute MI. The EKG did demonstrate probably old inferior ischemic change.  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  The patient at the present time is asymptomatic. He denies any headaches, nausea, vomiting or speech difficulties. He continues to have mild dysarthria and mild staggering of his left lower extremity. He is scheduled for outpatient physical therapy.  He also denies any episodes of loss of consciousness or seizures. He denies any visual aberrations.  He denies any recent chest pain, shortness of breath or pedal edema.  He denies any UTI symptoms of dysuria, frequency of micturition or hematuria. Denies any recent cough, wheezing or hemoptysis.  Past Medical History: Significant for asthma, depression, diabetes mellitus, heart problems, high blood pressure, high cholesterol, reflux, indigestion. Questionable peripheral vascular disease.  Previous Surgeries:  Shoulder surgery,  hand surgery.  Medications: Albuterol inhaler. Lipitor. Norco/Vicodin. Metformin. Nitroglycerin. Altace. Spiriva inhaler.  Allergies.  Tylox.  Social History: Patient is divorced. Has three children alive and well. Has three sisters. Denies any alcohol use. Smokes intermittently. Denies any use of illicit chemicals.  Family history: Lung cancer. Diabetes mellitus. Seizures. Headaches. Heart problems. High blood pressure. Psychiatric disease. Thyroid problems. Stroke. Strong family history of intracranial aneurysms in uncle, aunt and mother.  PHYSICAL EXAMINATION: In no acute distress. Affect normal. Patient fully alert, awake and oriented to time, place, space. Minimal dysarthria. Mild dragging of left leg on walking. Balance unremarkable.  ASSESSMENT AND PLAN: The patient's catheter angiogram was reviewed with the patient and the family. The occluded right internal carotid artery with clots was discussed. Also discussed was the left middle cerebral artery bifurcation aneurysm which demonstrates smooth contour. The patient expressed the strong desire to have the aneurysm treated endovascularly, given the strong family history of intracranial aneurysms.  The risk of rupture was mentioned to be that of 1 to 2%. The attendant mortality and morbidity was also reviewed. Risk factors for increased risk of rupture and/or enlargement mentioned were those of a strong family history as that of the patient, patient's history of high blood pressure and also smoking. The procedure of endovascular treatment was discussed. Options would include primary coiling versus stent assisted coiling. The risk of 1% of stroke, with a remote possibility of intra procedure rupture requiring emergent surgery and death were also reviewed. The patient wants to proceed with endovascular treatment at the earliest possible. He was informed the earliest that endovascular treatment would be undertaken under his clinical condition and angiographic  findings would be toward the end of October, that is in 4 weeks time. Nearer to the time the patient will be started on aspirin and Plavix. Both the patient and family leave with good understanding and agreement with the above management plan. They were asked to call should they have any concerns or questions.   Electronically Signed   By: Julieanne CottonSanjeev  Cadie Sorci M.D.   On: 07/16/2014 10:37    Labs:  CBC:  Recent Labs  07/07/14 2053 07/08/14 0125 07/08/14 0800 08/02/14 1511  WBC 8.1 8.1 6.3 7.3  HGB 14.8 13.9 13.5 13.3  HCT 42.5 40.3 39.2 38.5*  PLT 243 208 192 197    COAGS:  Recent Labs  07/03/14 2139 07/08/14 0125 08/02/14 1511  INR 0.89 1.04 1.05  APTT 30  --  29    BMP:  Recent Labs  07/03/14 2139 07/03/14 2207 07/07/14 2053 07/08/14 0125 08/02/14 1511  NA 139 139 136* 138 141  K 3.9 3.7 4.3 4.1 3.9  CL 100 102 98 99 103  CO2 27  --  25 24 28   GLUCOSE 144* 144* 110* 110* 111*  BUN 12 11 16 16 12   CALCIUM 9.3  --  9.1 9.1 9.2  CREATININE 0.90 1.00 0.99 0.86 0.98  GFRNONAA >90  --  >90 >90 >90  GFRAA >90  --  >90 >90 >90    LIVER FUNCTION TESTS:  Recent Labs  05/13/14 1855 07/03/14 2139 07/07/14 2053 08/02/14 1511  BILITOT 0.5 <0.2* 0.4 <0.2*  AST 19 16 17 15   ALT 19 18 16 18   ALKPHOS 65 60 65 61  PROT 7.2 6.5 7.4 6.9  ALBUMIN 4.1 3.3* 3.6 3.6    TUMOR MARKERS: No results found for this basename: AFPTM, CEA, CA199, CHROMGRNA,  in the last 8760 hours  Assessment and Plan:  Hx CVA Unruptured L MCA aneurysm Scheduled now for cerebral arteriogram with possible embolization in IR Pt and mother aware of procedure benefits and risks and agreeable to proceed Consent signed andin chart Pt aware if intervention proceeds he will be admitted overnight to Neuro ICU  Plan for dc 10/29  Thank you for this interesting consult.  I greatly enjoyed meeting [name] and look forward to participating in their care.    I spent a total of 20 minutes face  to face in clinical consultation, greater than 50% of which was counseling/coordinating care for cerebral arteriogram with possible embolization of L MCA anaeurysm  Signed: TURPIN,PAMELA A 08/08/2014, 8:05 AM

## 2014-08-08 NOTE — Sedation Documentation (Signed)
Notified Neuro PACU that pt would be up shortly.  V/u.

## 2014-08-08 NOTE — Progress Notes (Signed)
Right groin with pressure dressing, clean, dry, in tact.

## 2014-08-08 NOTE — Sedation Documentation (Signed)
[name], Pepco Holdingsad Tech, removed cath to Right groin, applied manual pressure x 10min, applied Vpad and pressure dressing. No hematoma or bleeding noted to site.  Pulses to RLE same as pre-procedure, palpable.

## 2014-08-08 NOTE — Sedation Documentation (Signed)
Neuro check performed by Dr. Corliss Skainseveshwar, no deficits noted. Family called to IR room following procedure. Dr. Corliss Skainseveshwar at bedside explaining outcome of procedure to pt/family.  Pt awake but drowsy following recent extubation by anesthesia.  Denied headache, no n/v.

## 2014-08-08 NOTE — Procedures (Signed)
S/P bilateral common carotid arteriograms. RT CFA approach. Findings.[name]. 1.Approx 95% stenosis of RT ICA prox. 2.Lt LMCA  4.728mm x 3.6 mm bifurcation aneurysm obliterated using stent(LVIS JR )assisted coiling . 3.Unmasking of RT MCa reg aneurysm 3.425mm x 3mm 4.Unchanged Lt ICA prox ACA A1aneurysm 2.797mm x 1.476mm .

## 2014-08-08 NOTE — Transfer of Care (Signed)
Immediate Anesthesia Transfer of Care Note  Patient: [name]  Procedure(s) Performed: Procedure(s): RADIOLOGY WITH ANESTHESIA EMBOLIZATION (N/A)  Patient Location: PACU  Anesthesia Type:General  Level of Consciousness: awake, alert  and oriented  Airway & Oxygen Therapy: Patient Spontanous Breathing  Post-op Assessment: Report given to PACU RN  Post vital signs: Reviewed and stable  Complications: No apparent anesthesia complications

## 2014-08-08 NOTE — Progress Notes (Signed)
ANTICOAGULATION CONSULT NOTE - Initial Consult  Pharmacy Consult for heparin Indication: S/P bilateral common carotid arteriograms with embolization for L MCA aneurysm   Allergies  Allergen Reactions  . Tylox [Oxycodone-Acetaminophen] Hives and Other (See Comments)    Reaction: Tremors and diaphoresis     Patient Measurements: weight 111 kg, height 68 inches   Heparin Dosing Weight: 93 kg  Vital Signs: Temp: 97.9 F (36.6 C) (10/28 1224) Temp Source: Oral (10/28 0636) BP: 106/65 mmHg (10/28 1224) Pulse Rate: 74 (10/28 1224)  Labs: No results found for this basename: HGB, HCT, PLT, APTT, LABPROT, INR, HEPARINUNFRC, CREATININE, CKTOTAL, CKMB, TROPONINI,  in the last 72 hours  The CrCl is unknown because both a height and weight (above a minimum accepted value) are required for this calculation.   Medical History: Past Medical History  Diagnosis Date  . Asthma     Last asthma attack at age 89; History of trach at 16 months  . COPD (chronic obstructive pulmonary disease) 06/2012    [facility]enny Jones[facility]  . Headache(784.0)   . Diabetes mellitus without complication   . Stroke     2015  . Hypertension   . GERD (gastroesophageal reflux disease)   . H/O hiatal hernia     Medications:  See med rec  Assessment: Patient is a 53 y.o M s/p S/P bilateral common carotid arteriograms with embolization for L MCA aneurysm. Heparin started in neuro NPACU at 500 units/hr.   Goal of Therapy:  Heparin level 0.1-0.25 units/ml Monitor platelets by anticoagulation protocol: Yes   Plan:  1) increase heparin drip to 900 units/hr  2) check 6 hour heparin level 3) Discontinue heparin at 0700 tomorrow for sheath removal.    Shandel Busic P 08/08/2014,12:47 PM

## 2014-08-08 NOTE — Progress Notes (Signed)
ANTICOAGULATION CONSULT NOTE  Pharmacy Consult for heparin Indication: S/P bilateral common carotid arteriograms with embolization for L MCA aneurysm   Allergies  Allergen Reactions  . Tylox [Oxycodone-Acetaminophen] Hives and Other (See Comments)    Reaction: Tremors and diaphoresis     Patient Measurements:  weight: 111 kg, height: 68 inches Heparin Dosing Weight: 93 kg  Vital Signs: Temp: 97.9 F (36.6 C) (10/28 1955) Temp Source: Oral (10/28 1955) BP: 110/64 mmHg (10/28 2100) Pulse Rate: 60 (10/28 2100)  Labs:  Recent Labs  08/08/14 2010  HEPARINUNFRC <0.10*   Assessment: 53 y.o M s/p S/P bilateral common carotid arteriograms with embolization for L MCA aneurysm. Heparin started in neuro NPACU. Initial heparin level undetectable. No bleeding noted.   Goal of Therapy:  Heparin level 0.1-0.25 units/ml Monitor platelets by anticoagulation protocol: Yes   Plan:  1. increase heparin drip to 1000 units/hr  2. Daily HL and CBC 3. Discontinue heparin at 0700 tomorrow for sheath removal  Fabiana Dromgoole D. Lumina Gitto, PharmD, BCPS Clinical Pharmacist Pager: [phone] 08/08/2014 9:08 PM

## 2014-08-08 NOTE — Anesthesia Preprocedure Evaluation (Signed)
Anesthesia Evaluation  Patient identified by MRN, date of birth, ID band Patient awake    Reviewed: Allergy & Precautions, H&P , NPO status , Patient's Chart, lab work & pertinent test results  Airway        Dental   Pulmonary asthma , COPDformer smoker,          Cardiovascular hypertension, + Peripheral Vascular Disease     Neuro/Psych  Headaches, CVA    GI/Hepatic hiatal hernia, GERD-  ,  Endo/Other  diabetes, Type 2  Renal/GU      Musculoskeletal   Abdominal   Peds  Hematology   Anesthesia Other Findings   Reproductive/Obstetrics                             Anesthesia Physical Anesthesia Plan  ASA: III  Anesthesia Plan: General and MAC   Post-op Pain Management:    Induction: Intravenous  Airway Management Planned: Oral ETT and Mask  Additional Equipment:   Intra-op Plan:   Post-operative Plan: Extubation in OR  Informed Consent: I have reviewed the patients History and Physical, chart, labs and discussed the procedure including the risks, benefits and alternatives for the proposed anesthesia with the patient or authorized representative who has indicated his/her understanding and acceptance.     Plan Discussed with: CRNA, Anesthesiologist and Surgeon  Anesthesia Plan Comments:         Anesthesia Quick Evaluation

## 2014-08-08 NOTE — Anesthesia Postprocedure Evaluation (Signed)
  Anesthesia Post-op Note  Patient: [name]  Procedure(s) Performed: Procedure(s): RADIOLOGY WITH ANESTHESIA EMBOLIZATION (N/A)  Patient Location: PACU  Anesthesia Type:General  Level of Consciousness: awake, alert , oriented, patient cooperative and responds to stimulation  Airway and Oxygen Therapy: Patient Spontanous Breathing and Patient connected to nasal cannula oxygen  Post-op Pain: none  Post-op Assessment: Post-op Vital signs reviewed, Patient's Cardiovascular Status Stable, Respiratory Function Stable, Patent Airway, No signs of Nausea or vomiting and Pain level controlled  Post-op Vital Signs: Reviewed and stable  Last Vitals:  Filed Vitals:   08/08/14 1224  BP: 106/65  Pulse: 74  Temp: 36.6 C  Resp: 18    Complications: No apparent anesthesia complications

## 2014-08-08 NOTE — Sedation Documentation (Signed)
Thayer Ohmhris, radiology tech, pulled sheath, held pressure, inserted Exoseal, held pressure x 10 min, applied Vpad and pressure dressing to RT groin.  No bleeding or hematoma noted to site.

## 2014-08-08 NOTE — Progress Notes (Signed)
Patient ID: [name], male   DOB: 05/13/1961, 53 y.o.   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 Authorizing Provider  albuterol (PROVENTIL HFA;VENTOLIN HFA) 108 (90 BASE) MCG/ACT inhaler Inhale 1 puff into the lungs every 6 (six) hours as needed for wheezing or shortness of breath.   Yes Historical Provider, MD  aspirin EC 81 MG tablet Take 81 mg by mouth daily.   Yes Historical Provider, MD  atorvastatin (LIPITOR) 20 MG tablet Take 1 tablet (20 mg total) by mouth daily at 6 PM. 07/06/14  Yes Calvert CantorSaima Rizwan, MD  clopidogrel (PLAVIX) 75 MG tablet Take 75 mg by mouth daily.   Yes Historical Provider, MD  HYDROcodone-acetaminophen (NORCO/VICODIN) 5-325 MG per tablet Take 1 tablet by mouth 2 (two) times daily as needed for moderate pain. 07/08/14  Yes Ricki RodriguezAjay S Kadakia, MD  metFORMIN (GLUCOPHAGE) 500 MG tablet Take 1 tablet (500 mg total) by mouth 2 (two) times daily with a meal. 07/06/14  Yes Calvert CantorSaima Rizwan, MD  ramipril (ALTACE) 2.5 MG capsule Take 2.5 mg by mouth daily.   Yes Historical Provider, MD  STUDY MEDICATION Take 1 tablet by mouth daily. "Acetylsalicylic"   Yes Historical Provider, MD  STUDY MEDICATION Take 1 tablet by mouth 2 (two) times daily.   Yes Historical Provider, MD  tiotropium (SPIRIVA HANDIHALER) 18 MCG inhalation capsule Place 1 capsule (18 mcg total) into inhaler and inhale every morning. 07/06/14  Yes Calvert CantorSaima Rizwan, MD  nitroGLYCERIN (NITROSTAT) 0.4 MG SL tablet Place 1 tablet (0.4 mg total) under the tongue every 5 (five) minutes x 3 doses as needed for chest pain.  07/08/14   Ricki RodriguezAjay S Kadakia, MD    Review of Systems  Vital Signs: BP 101/61  Pulse 62  Temp(Src) 98 F (36.7 C)  Resp 16  SpO2 97%  Physical Exam  Constitutional:  Dr Corliss Skainseveshwar has seen and examined pt   HENT:  EOM; appropriate Pleasant Tongue midline   Abdominal:  Rt groin NT; no bleeding Soft; no hematoma Rt foot 2+ pulses   Musculoskeletal:  Good strength B Good sensation Pus/pull feet = B    Imaging: No results found.  Labs:  CBC:  Recent Labs  07/07/14 2053 07/08/14 0125 07/08/14 0800 08/02/14 1511  WBC 8.1 8.1 6.3 7.3  HGB 14.8 13.9 13.5 13.3  HCT 42.5 40.3 39.2 38.5*  PLT 243 208 192 197    COAGS:  Recent Labs  07/03/14 2139 07/08/14 0125 08/02/14 1511  INR 0.89 1.04 1.05  APTT 30  --  29    BMP:  Recent Labs  07/03/14 2139 07/03/14 2207 07/07/14 2053 07/08/14 0125 08/02/14 1511  NA 139 139 136* 138 141  K 3.9 3.7 4.3 4.1 3.9  CL 100 102 98 99 103  CO2 27  --  25 24 28   GLUCOSE 144* 144* 110* 110* 111*  BUN 12 11 16  16 12  CALCIUM 9.3  --  9.1 9.1 9.2  CREATININE 0.90 1.00 0.99 0.86 0.98  GFRNONAA >90  --  >90 >90 >90  GFRAA >90  --  >90 >90 >90    LIVER FUNCTION TESTS:  Recent Labs  05/13/14 1855 07/03/14 2139 07/07/14 2053 08/02/14 1511  BILITOT 0.5 <0.2* 0.4 <0.2*  AST 19 16 17 15   ALT 19 18 16 18   ALKPHOS 65 60 65 61  PROT 7.2 6.5 7.4 6.9  ALBUMIN 4.1 3.3* 3.6 3.6    Assessment and Plan:  Post L MCA aneurysm coiling/stent placement Overnight admission May raise head of bed 30 degrees at 4pm Continued bedrest overnight Keep diet at liquids for now    I spent a total of 15 minutes face to face in clinical consultation/evaluation, greater than 50% of which was counseling/coordinating care for L MCA aneurysm coiling/stent placement  Signed: TURPIN,PAMELA A 08/08/2014, 3:42 PM

## 2014-08-08 NOTE — Sedation Documentation (Signed)
Pt transferred to Neuro PACU on monitor w/ CRNA and myself.  Report given to Irving BurtonEmily, RN to keep SBP 115-135 and restart Heparin gtt per Dr. Fatima Sangereveshwar's instructions.  RT groin checked, dressing dry and intact, no hematoma/swelling noted to site. Pt reminded to keep leg straight again and not lift head. CRNA gave report to [place] as well.

## 2014-08-09 ENCOUNTER — Other Ambulatory Visit: Payer: Self-pay | Admitting: Radiology

## 2014-08-09 DIAGNOSIS — I6521 Occlusion and stenosis of right carotid artery: Secondary | ICD-10-CM

## 2014-08-09 DIAGNOSIS — I671 Cerebral aneurysm, nonruptured: Secondary | ICD-10-CM

## 2014-08-09 LAB — BASIC METABOLIC PANEL
ANION GAP: 11 (ref 5–15)
BUN: 9 mg/dL (ref 6–23)
CALCIUM: 8.8 mg/dL (ref 8.4–10.5)
CO2: 25 mEq/L (ref 19–32)
CREATININE: 0.82 mg/dL (ref 0.50–1.35)
Chloride: 103 mEq/L (ref 96–112)
Glucose, Bld: 106 mg/dL — ABNORMAL HIGH (ref 70–99)
Potassium: 4 mEq/L (ref 3.7–5.3)
SODIUM: 139 meq/L (ref 137–147)

## 2014-08-09 LAB — CBC WITH DIFFERENTIAL/PLATELET
BASOS ABS: 0 10*3/uL (ref 0.0–0.1)
Basophils Relative: 0 % (ref 0–1)
Eosinophils Absolute: 0.1 10*3/uL (ref 0.0–0.7)
Eosinophils Relative: 2 % (ref 0–5)
HEMATOCRIT: 35.4 % — AB (ref 39.0–52.0)
Hemoglobin: 12 g/dL — ABNORMAL LOW (ref 13.0–17.0)
Lymphocytes Relative: 29 % (ref 12–46)
Lymphs Abs: 2.4 10*3/uL (ref 0.7–4.0)
MCH: 29.5 pg (ref 26.0–34.0)
MCHC: 33.9 g/dL (ref 30.0–36.0)
MCV: 87 fL (ref 78.0–100.0)
MONO ABS: 0.6 10*3/uL (ref 0.1–1.0)
Monocytes Relative: 7 % (ref 3–12)
NEUTROS ABS: 5.1 10*3/uL (ref 1.7–7.7)
Neutrophils Relative %: 62 % (ref 43–77)
PLATELETS: 176 10*3/uL (ref 150–400)
RBC: 4.07 MIL/uL — ABNORMAL LOW (ref 4.22–5.81)
RDW: 12.6 % (ref 11.5–15.5)
WBC: 8.2 10*3/uL (ref 4.0–10.5)

## 2014-08-09 LAB — GLUCOSE, CAPILLARY
Glucose-Capillary: 98 mg/dL (ref 70–99)
Glucose-Capillary: 107 mg/dL — ABNORMAL HIGH (ref 70–99)

## 2014-08-09 LAB — HEPARIN LEVEL (UNFRACTIONATED)

## 2014-08-09 MED ORDER — LIDOCAINE HCL (PF) 1 % IJ SOLN
INTRAMUSCULAR | Status: AC
  Filled 2014-08-09: qty 30

## 2014-08-09 NOTE — Progress Notes (Signed)
Pt feeling ok. C/o mild left neck pain and headache last pm, which is resolved this am He is otherwise feeling good this am. Denies N/V Heparin has been turned off.  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

## 2014-08-09 NOTE — Discharge Summary (Signed)
Physician Discharge Summary  Patient ID: [name] MRN: [id number] DOB/AGE: 52/02/1961 52 y.o.  Admit date: 08/08/2014 Discharge date: 08/09/2014  Admission Diagnoses: Active Problems:   Brain aneurysm  Discharge Diagnoses:  Active Problems:   Brain aneurysm    Procedures: Procedure(s): RADIOLOGY WITH ANESTHESIA EMBOLIZATION  Discharged Condition: good  Hospital Course: [name] is a 53 y.o. male who suffered stroke 06/2014  Noted Cerebral angiogram at that time revealed severe stenosis of R internal carotid artery  Doing well post CVA with medical management  Also seen on arteriogram was L middle cerebral artery unruptured aneurysm  Was consulted with Dr [name] 07/13/14  Now scheduled for cerebral arteriogram with possible embolization  Summary of Hospital Course- Brought to IR suite with general anesthesia, underwent successful treatment of (L)MCA aneuysm with LVIS JR stent assisted coiling. See Op report for full details. He was admitted to the neuro ICU in stable condition. BP remained stable. Pt feels well POD #1. No post op complications or events noted. Pt is determined to be stable for discharge. Has been given ASA and Plavix as directed. All instructions, restrictions, medications, and follow up plans have been with patient and his mother. Pt will be scheduled for 2 week follow up appointment.  Consults: None   Discharge Exam: Blood pressure 113/63, pulse 52, temperature 98 F (36.7 C), temperature source Oral, resp. rate 19, height 5\' 8"  (1.727 m), weight 244 lb 14.9 oz (111.1 kg), SpO2 98.00%. BP 109/60  Pulse 53  Temp(Src) 98 F (36.7 C) (Oral)  Resp 12  Ht 5\' 8"  (1.727 m)  Wt 244 lb 14.9 oz (111.1 kg)  BMI 37.25 kg/m2  SpO2 96%  Lungs: CTA without w/r/r  Heart: Regular  Ext: (R)groin soft, NT, no hematoma  Neuro:  PERRLA, EOMI  Tongue midline  Face symmetric  Strength 5/5 UE and LE Slight delay with (L)finger to nose  test   Disposition: 01-Home or Self Care  Discharge Instructions   Call MD for:  difficulty breathing, headache or visual disturbances    Complete by:  As directed      Call MD for:  persistant nausea and vomiting    Complete by:  As directed      Call MD for:  redness, tenderness, or signs of infection (pain, swelling, redness, odor or green/yellow discharge around incision site)    Complete by:  As directed      Call MD for:  severe uncontrolled pain    Complete by:  As directed      Call MD for:  temperature >100.4    Complete by:  As directed      Diet - low sodium heart healthy    Complete by:  As directed      Driving Restrictions    Complete by:  As directed   No driving unless emergency for 2 weeks     Increase activity slowly    Complete by:  As directed      Lifting restrictions    Complete by:  As directed   No strenuous/vigorous activity x 2 weeks     May shower / Bathe    Complete by:  As directed      May walk up steps    Complete by:  As directed      Remove dressing in 24 hours    Complete by:  As directed             Medication List  albuterol 108 (90 BASE) MCG/ACT inhaler  Commonly known as:  PROVENTIL HFA;VENTOLIN HFA  Inhale 1 puff into the lungs every 6 (six) hours as needed for wheezing or shortness of breath.     aspirin EC 81 MG tablet  Take 81 mg by mouth daily.     atorvastatin 20 MG tablet  Commonly known as:  LIPITOR  Take 1 tablet (20 mg total) by mouth daily at 6 PM.     clopidogrel 75 MG tablet  Commonly known as:  PLAVIX  Take 75 mg by mouth daily.     HYDROcodone-acetaminophen 5-325 MG per tablet  Commonly known as:  NORCO/VICODIN  Take 1 tablet by mouth 2 (two) times daily as needed for moderate pain.     metFORMIN 500 MG tablet  Commonly known as:  GLUCOPHAGE  Take 1 tablet (500 mg total) by mouth 2 (two) times daily with a meal.     nitroGLYCERIN 0.4 MG SL tablet  Commonly known as:  NITROSTAT  Place 1 tablet  (0.4 mg total) under the tongue every 5 (five) minutes x 3 doses as needed for chest pain.     ramipril 2.5 MG capsule  Commonly known as:  ALTACE  Take 2.5 mg by mouth daily.     STUDY MEDICATION  Take 1 tablet by mouth daily. "Acetylsalicylic"     STUDY MEDICATION  Take 1 tablet by mouth 2 (two) times daily.     tiotropium 18 MCG inhalation capsule  Commonly known as:  SPIRIVA HANDIHALER  Place 1 capsule (18 mcg total) into inhaler and inhale every morning.           Follow-up Information   Follow up with Ivan GroutEVESHWAR,Ivan Derks K, MD. Schedule an appointment as soon as possible for a visit in 2 weeks. (Office will call you with appt date/time)    Specialty:  Interventional Radiology   Contact information:   [address] [place] [address] [phone]       Signed: Brayton ElBRUNING, KEVIN PA-C 08/09/2014, 11:01 AM

## 2014-08-10 ENCOUNTER — Encounter (HOSPITAL_COMMUNITY): Payer: Self-pay | Admitting: Radiology

## 2014-08-11 ENCOUNTER — Inpatient Hospital Stay (HOSPITAL_COMMUNITY): Payer: Medicaid Other

## 2014-08-11 ENCOUNTER — Emergency Department (HOSPITAL_COMMUNITY): Payer: Medicaid Other

## 2014-08-11 ENCOUNTER — Inpatient Hospital Stay (HOSPITAL_COMMUNITY)
Admission: EM | Admit: 2014-08-11 | Discharge: 2014-08-14 | DRG: 091 | Disposition: A | Discharge status: Home / Self Care | Payer: Medicaid Other | Attending: Internal Medicine | Admitting: Internal Medicine

## 2014-08-11 ENCOUNTER — Encounter (HOSPITAL_COMMUNITY): Payer: Self-pay | Admitting: Emergency Medicine

## 2014-08-11 DIAGNOSIS — I10 Essential (primary) hypertension: Secondary | ICD-10-CM | POA: Diagnosis present

## 2014-08-11 DIAGNOSIS — I671 Cerebral aneurysm, nonruptured: Secondary | ICD-10-CM | POA: Diagnosis present

## 2014-08-11 DIAGNOSIS — Z6829 Body mass index (BMI) 29.0-29.9, adult: Secondary | ICD-10-CM

## 2014-08-11 DIAGNOSIS — E1159 Type 2 diabetes mellitus with other circulatory complications: Secondary | ICD-10-CM | POA: Diagnosis present

## 2014-08-11 DIAGNOSIS — I619 Nontraumatic intracerebral hemorrhage, unspecified: Secondary | ICD-10-CM | POA: Diagnosis not present

## 2014-08-11 DIAGNOSIS — Z823 Family history of stroke: Secondary | ICD-10-CM | POA: Diagnosis not present

## 2014-08-11 DIAGNOSIS — R059 Cough, unspecified: Secondary | ICD-10-CM

## 2014-08-11 DIAGNOSIS — Z79899 Other long term (current) drug therapy: Secondary | ICD-10-CM

## 2014-08-11 DIAGNOSIS — E785 Hyperlipidemia, unspecified: Secondary | ICD-10-CM | POA: Diagnosis present

## 2014-08-11 DIAGNOSIS — J45909 Unspecified asthma, uncomplicated: Secondary | ICD-10-CM | POA: Diagnosis present

## 2014-08-11 DIAGNOSIS — R42 Dizziness and giddiness: Secondary | ICD-10-CM | POA: Diagnosis not present

## 2014-08-11 DIAGNOSIS — R05 Cough: Secondary | ICD-10-CM

## 2014-08-11 DIAGNOSIS — R29898 Other symptoms and signs involving the musculoskeletal system: Secondary | ICD-10-CM | POA: Diagnosis present

## 2014-08-11 DIAGNOSIS — Z7902 Long term (current) use of antithrombotics/antiplatelets: Secondary | ICD-10-CM

## 2014-08-11 DIAGNOSIS — J449 Chronic obstructive pulmonary disease, unspecified: Secondary | ICD-10-CM | POA: Diagnosis present

## 2014-08-11 DIAGNOSIS — Z8673 Personal history of transient ischemic attack (TIA), and cerebral infarction without residual deficits: Secondary | ICD-10-CM

## 2014-08-11 DIAGNOSIS — I69359 Hemiplegia and hemiparesis following cerebral infarction affecting unspecified side: Secondary | ICD-10-CM

## 2014-08-11 DIAGNOSIS — E669 Obesity, unspecified: Secondary | ICD-10-CM | POA: Diagnosis present

## 2014-08-11 DIAGNOSIS — I610 Nontraumatic intracerebral hemorrhage in hemisphere, subcortical: Secondary | ICD-10-CM | POA: Diagnosis present

## 2014-08-11 DIAGNOSIS — K219 Gastro-esophageal reflux disease without esophagitis: Secondary | ICD-10-CM | POA: Diagnosis present

## 2014-08-11 DIAGNOSIS — E1151 Type 2 diabetes mellitus with diabetic peripheral angiopathy without gangrene: Secondary | ICD-10-CM | POA: Diagnosis not present

## 2014-08-11 DIAGNOSIS — M6281 Muscle weakness (generalized): Secondary | ICD-10-CM | POA: Diagnosis not present

## 2014-08-11 DIAGNOSIS — Z87891 Personal history of nicotine dependence: Secondary | ICD-10-CM | POA: Diagnosis not present

## 2014-08-11 DIAGNOSIS — Z7982 Long term (current) use of aspirin: Secondary | ICD-10-CM

## 2014-08-11 DIAGNOSIS — G459 Transient cerebral ischemic attack, unspecified: Secondary | ICD-10-CM | POA: Diagnosis present

## 2014-08-11 DIAGNOSIS — G458 Other transient cerebral ischemic attacks and related syndromes: Secondary | ICD-10-CM | POA: Diagnosis not present

## 2014-08-11 DIAGNOSIS — I639 Cerebral infarction, unspecified: Secondary | ICD-10-CM

## 2014-08-11 LAB — CBC WITH DIFFERENTIAL/PLATELET
BASOS ABS: 0 10*3/uL (ref 0.0–0.1)
Basophils Relative: 1 % (ref 0–1)
Eosinophils Absolute: 0.3 10*3/uL (ref 0.0–0.7)
Eosinophils Relative: 4 % (ref 0–5)
HEMATOCRIT: 35.1 % — AB (ref 39.0–52.0)
Hemoglobin: 12 g/dL — ABNORMAL LOW (ref 13.0–17.0)
LYMPHS ABS: 2.6 10*3/uL (ref 0.7–4.0)
LYMPHS PCT: 41 % (ref 12–46)
MCH: 29.6 pg (ref 26.0–34.0)
MCHC: 34.2 g/dL (ref 30.0–36.0)
MCV: 86.7 fL (ref 78.0–100.0)
Monocytes Absolute: 0.5 10*3/uL (ref 0.1–1.0)
Monocytes Relative: 9 % (ref 3–12)
NEUTROS ABS: 2.8 10*3/uL (ref 1.7–7.7)
Neutrophils Relative %: 45 % (ref 43–77)
PLATELETS: 177 10*3/uL (ref 150–400)
RBC: 4.05 MIL/uL — ABNORMAL LOW (ref 4.22–5.81)
RDW: 12.3 % (ref 11.5–15.5)
WBC: 6.2 10*3/uL (ref 4.0–10.5)

## 2014-08-11 LAB — BASIC METABOLIC PANEL
ANION GAP: 11 (ref 5–15)
BUN: 10 mg/dL (ref 6–23)
CALCIUM: 9 mg/dL (ref 8.4–10.5)
CHLORIDE: 102 meq/L (ref 96–112)
CO2: 25 meq/L (ref 19–32)
Creatinine, Ser: 0.75 mg/dL (ref 0.50–1.35)
GFR calc Af Amer: >90 mL/min (ref >90)
GFR calc non Af Amer: >90 mL/min (ref >90)
Glucose, Bld: 104 mg/dL — ABNORMAL HIGH (ref 70–99)
Potassium: 3.9 mEq/L (ref 3.7–5.3)
SODIUM: 138 meq/L (ref 137–147)

## 2014-08-11 LAB — GLUCOSE, CAPILLARY
GLUCOSE-CAPILLARY: 106 mg/dL — AB (ref 70–99)
GLUCOSE-CAPILLARY: 92 mg/dL (ref 70–99)
Glucose-Capillary: 101 mg/dL — ABNORMAL HIGH (ref 70–99)

## 2014-08-11 MED ORDER — RANITIDINE HCL 150 MG/10ML PO SYRP: 150.0000 mg | ORAL_SOLUTION | Freq: Every day | ORAL | Status: DC

## 2014-08-11 MED ORDER — ATORVASTATIN CALCIUM 20 MG PO TABS
20.0000 mg | ORAL_TABLET | Freq: Every day | ORAL | Status: DC
  Administered 2014-08-11 – 2014-08-13 (×3): 20 mg via ORAL
  Filled 2014-08-11 (×3): qty 1
  Filled 2014-08-11: qty 2
  Filled 2014-08-11: qty 1
  Filled 2014-08-11 (×2): qty 2

## 2014-08-11 MED ORDER — HYDROCODONE-ACETAMINOPHEN 5-325 MG PO TABS
1.0000 | ORAL_TABLET | Freq: Once | ORAL | Status: AC
  Administered 2014-08-11: 1 via ORAL
  Filled 2014-08-11: qty 1

## 2014-08-11 MED ORDER — SODIUM CHLORIDE 0.9 % IV SOLN
INTRAVENOUS | Status: DC
  Administered 2014-08-11 – 2014-08-13 (×3): via INTRAVENOUS

## 2014-08-11 MED ORDER — ASPIRIN EC 81 MG PO TBEC
81.0000 mg | DELAYED_RELEASE_TABLET | Freq: Every day | ORAL | Status: DC
  Administered 2014-08-11: 81 mg via ORAL
  Filled 2014-08-11: qty 1

## 2014-08-11 MED ORDER — PANTOPRAZOLE SODIUM 40 MG PO TBEC
40.0000 mg | DELAYED_RELEASE_TABLET | Freq: Every day | ORAL | Status: DC
  Administered 2014-08-11 – 2014-08-14 (×4): 40 mg via ORAL
  Filled 2014-08-11 (×3): qty 1

## 2014-08-11 MED ORDER — ALBUTEROL SULFATE HFA 108 (90 BASE) MCG/ACT IN AERS: 1.0000 | INHALATION_SPRAY | Freq: Four times a day (QID) | RESPIRATORY_TRACT | Status: DC | PRN

## 2014-08-11 MED ORDER — TIOTROPIUM BROMIDE MONOHYDRATE 18 MCG IN CAPS
18.0000 ug | ORAL_CAPSULE | Freq: Every day | RESPIRATORY_TRACT | Status: DC
  Administered 2014-08-11 – 2014-08-14 (×4): 18 ug via RESPIRATORY_TRACT
  Filled 2014-08-11: qty 5

## 2014-08-11 MED ORDER — ONDANSETRON HCL 4 MG/2ML IJ SOLN
4.0000 mg | Freq: Four times a day (QID) | INTRAMUSCULAR | Status: DC | PRN
Start: 2014-08-11 — End: 2014-08-14
  Administered 2014-08-11 – 2014-08-12 (×3): 4 mg via INTRAVENOUS
  Filled 2014-08-11 (×3): qty 2

## 2014-08-11 MED ORDER — HYDROCODONE-ACETAMINOPHEN 5-325 MG PO TABS
1.0000 | ORAL_TABLET | Freq: Two times a day (BID) | ORAL | Status: DC | PRN
  Administered 2014-08-11 – 2014-08-13 (×4): 1 via ORAL
  Filled 2014-08-11 (×5): qty 1

## 2014-08-11 MED ORDER — CLOPIDOGREL BISULFATE 75 MG PO TABS
75.0000 mg | ORAL_TABLET | Freq: Every day | ORAL | Status: DC
  Administered 2014-08-11 – 2014-08-13 (×3): 75 mg via ORAL
  Filled 2014-08-11 (×3): qty 1

## 2014-08-11 MED ORDER — RAMIPRIL 2.5 MG PO CAPS
2.5000 mg | ORAL_CAPSULE | Freq: Every day | ORAL | Status: DC
Start: 2014-08-11 — End: 2014-08-14
  Administered 2014-08-11 – 2014-08-14 (×3): 2.5 mg via ORAL
  Filled 2014-08-11 (×4): qty 1

## 2014-08-11 MED ORDER — ALBUTEROL SULFATE (2.5 MG/3ML) 0.083% IN NEBU
2.5000 mg | INHALATION_SOLUTION | Freq: Four times a day (QID) | RESPIRATORY_TRACT | Status: DC | PRN
Start: 2014-08-11 — End: 2014-08-14

## 2014-08-11 MED ORDER — METFORMIN HCL 500 MG PO TABS
500.0000 mg | ORAL_TABLET | Freq: Two times a day (BID) | ORAL | Status: DC
Start: 2014-08-11 — End: 2014-08-14
  Administered 2014-08-11 – 2014-08-14 (×7): 500 mg via ORAL
  Filled 2014-08-11 (×7): qty 1

## 2014-08-11 MED ORDER — STROKE: EARLY STAGES OF RECOVERY BOOK
Freq: Once | Status: DC
  Filled 2014-08-11: qty 1

## 2014-08-11 MED ORDER — PANTOPRAZOLE SODIUM 40 MG PO TBEC: 40.0000 mg | DELAYED_RELEASE_TABLET | Freq: Every day | ORAL | Status: DC

## 2014-08-11 MED ORDER — ASPIRIN EC 325 MG PO TBEC
325.0000 mg | DELAYED_RELEASE_TABLET | Freq: Every day | ORAL | Status: DC
  Administered 2014-08-12 – 2014-08-13 (×2): 325 mg via ORAL
  Filled 2014-08-11 (×2): qty 1

## 2014-08-11 NOTE — ED Provider Notes (Signed)
CSN: [id number]     Arrival date & time 08/11/14  0137 History   First MD Initiated Contact with Patient 08/11/14 0315     Chief Complaint  Patient presents with  . Dizziness     (Consider location/radiation/quality/duration/timing/severity/associated sxs/prior Treatment) HPI 53 year old male presents to the emergency department from home via EMS with complaint of dizziness and tingling in his right arm this evening around 8:30 PM.  Symptoms lasted about 20 minutes and then resolved.  Patient reports around 9 PM he had another episode which prompted him to call 911.  He denies any facial droop weakness difficulties speaking and walking with this.  Patient had stroke at the end of September.  Patient had a left MCA aneurysm coiled on Wednesday.  Patient is due to have a second aneurysm repair in about 4 weeks.  He denies any fever or chills no nausea vomiting.  He is not currently nauseated.  He denies any visual problems, no headache. Past Medical History  Diagnosis Date  . Asthma     Last asthma attack at age 58; History of trach at 16 months  . COPD (chronic obstructive pulmonary disease) 06/2012    Dx by [facility]  . Headache(784.0)   . Diabetes mellitus without complication   . Stroke     2015  . Hypertension   . GERD (gastroesophageal reflux disease)   . H/O hiatal hernia    Past Surgical History  Procedure Laterality Date  . Tracheostomy closure    . Tracheostomy    . Fracture surgery      NO HX of Broken Bone.  Has rotator cuff tear repair and shoulder/clavicle reconstruction  . Joint replacement      NO HX of JOINT REPLACEMENT.  Rotator cuff tear repair and shoulder reconstruction  . Rectal surgery    . Rotator cuff repair    . Radiology with anesthesia N/A 08/08/2014    Procedure: RADIOLOGY WITH ANESTHESIA EMBOLIZATION;  Surgeon: Medication Radiologist, MD;  Location: [facility] OR;  Service: Radiology;  Laterality: N/A;   Family History   Problem Relation Age of Onset  . Heart disease Mother     s/p 3V CABG  . Cancer Father 80    lung cancer; +tobacco  . Cancer Maternal Grandmother   . Heart disease Maternal Grandmother   . Cancer Paternal Grandmother   . Lupus Sister   . Multiple sclerosis Sister   . Anemia Daughter   . Stroke Father    History  Substance Use Topics  . Smoking status: Former Smoker -- 1.00 packs/day    Types: Cigarettes    Start date: 01/15/1975    Quit date: 06/03/2014  . Smokeless tobacco: Never Used     Comment: down from 2 ppd; has used an e-cigarette  . Alcohol Use: 1.8 oz/week    3 Cans of beer per week    Review of Systems   See History of Present Illness; otherwise all other systems are reviewed and negative  Allergies  Tylox  Home Medications   Prior to Admission medications   Medication Sig Start Date End Date Taking? Authorizing Provider  albuterol (PROVENTIL HFA;VENTOLIN HFA) 108 (90 BASE) MCG/ACT inhaler Inhale 1 puff into the lungs every 6 (six) hours as needed for wheezing or shortness of breath.   Yes Historical Provider, MD  aspirin EC 81 MG tablet Take 81 mg by mouth daily.   Yes Historical Provider, MD  atorvastatin (LIPITOR) 20 MG tablet  Take 1 tablet (20 mg total) by mouth daily at 6 PM. 07/06/14  Yes Calvert CantorSaima Rizwan, MD  clopidogrel (PLAVIX) 75 MG tablet Take 75 mg by mouth daily.   Yes Historical Provider, MD  HYDROcodone-acetaminophen (NORCO/VICODIN) 5-325 MG per tablet Take 1 tablet by mouth 2 (two) times daily as needed for moderate pain. 07/08/14  Yes Ricki RodriguezAjay S Kadakia, MD  metFORMIN (GLUCOPHAGE) 500 MG tablet Take 1 tablet (500 mg total) by mouth 2 (two) times daily with a meal. 07/06/14  Yes Calvert CantorSaima Rizwan, MD  nitroGLYCERIN (NITROSTAT) 0.4 MG SL tablet Place 1 tablet (0.4 mg total) under the tongue every 5 (five) minutes x 3 doses as needed for chest pain. 07/08/14  Yes Ricki RodriguezAjay S Kadakia, MD  ramipril (ALTACE) 2.5 MG capsule Take 2.5 mg by mouth daily.   Yes Historical  Provider, MD  tiotropium (SPIRIVA HANDIHALER) 18 MCG inhalation capsule Place 1 capsule (18 mcg total) into inhaler and inhale every morning. 07/06/14  Yes Saima Rizwan, MD   BP 114/64  Pulse 65  Temp(Src) 98.1 F (36.7 C) (Oral)  Resp 17  SpO2 94% Physical Exam  Nursing note and vitals reviewed. Constitutional: He is oriented to person, place, and time. He appears well-developed and well-nourished.  HENT:  Head: Normocephalic.  Right Ear: External ear normal.  Left Ear: External ear normal.  Nose: Nose normal.  Mouth/Throat: Oropharynx is clear and moist.  Patient has abrasion to back of head, scratched his scalp while here in the emergency department.  No active bleeding  Eyes: Conjunctivae and EOM are normal. Pupils are equal, round, and reactive to light.  Neck: Normal range of motion. Neck supple. No JVD present. No tracheal deviation present. No thyromegaly present.  Cardiovascular: Normal rate, regular rhythm, normal heart sounds and intact distal pulses.  Exam reveals no gallop and no friction rub.   No murmur heard. Pulmonary/Chest: Effort normal and breath sounds normal. No stridor. No respiratory distress. He has no wheezes. He has no rales. He exhibits no tenderness.  Abdominal: Soft. Bowel sounds are normal. He exhibits no distension and no mass. There is no tenderness. There is no rebound and no guarding.  Musculoskeletal: Normal range of motion. He exhibits no edema and no tenderness.  Lymphadenopathy:    He has no cervical adenopathy.  Neurological: He is alert and oriented to person, place, and time. He displays normal reflexes. No cranial nerve deficit. He exhibits normal muscle tone. Coordination normal.  Skin: Skin is warm and dry. No rash noted. No erythema. No pallor.  Psychiatric: He has a normal mood and affect. His behavior is normal. Judgment and thought content normal.    ED Course  Procedures (including critical care time) Labs Review Labs Reviewed  CBC  WITH DIFFERENTIAL - Abnormal; Notable for the following:    RBC 4.05 (*)    Hemoglobin 12.0 (*)    HCT 35.1 (*)    All other components within normal limits  BASIC METABOLIC PANEL - Abnormal; Notable for the following:    Glucose, Bld 104 (*)    All other components within normal limits    Imaging Review Ct Head Wo Contrast  08/11/2014   CLINICAL DATA:  Severe headache today with but tingling in the right hand with nausea and dizziness. Recent right middle cerebral artery infarct. 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 Known right middle cerebral artery aneurysm.  EXAM: CT HEAD WITHOUT CONTRAST  TECHNIQUE: Contiguous axial images were obtained from the base of the  skull through the vertex without intravenous contrast.  COMPARISON:  CT scan dated 07/07/2014 and MRI dated 07/04/2014  FINDINGS: The patient has developed focal hemorrhagic transformation of the right middle cerebral artery infarct since the prior CT scan. Edema at the site of the infarct has markedly diminished.  The mass effect upon the right lateral ventricle present on the prior exam has resolved. There is no midline shift. Coils and stents are seen in the left middle cerebral artery. No new infarction. No osseous abnormality.  IMPRESSION: 1. Focal small areas of hemorrhagic transformation of the right middle cerebral artery infarct in the right insula. No subarachnoid hemorrhage. 2. Marked improvement in edema at the site of the prior infarct with resolution of mass effect upon the right lateral ventricle.   Electronically Signed   By: Geanie CooleyJim  Maxwell M.D.   On: 08/11/2014 04:36     EKG Interpretation   Date/Time:  Saturday August 11 2014 01:47:46 EDT Ventricular Rate:  67 PR Interval:  161 QRS Duration: 110 QT Interval:  433 QTC Calculation: 457 R Axis:   -14 Text Interpretation:  Sinus rhythm Abnormal R-wave progression, early  transition Confirmed by Oktober Glazer  MD, Sherilyn Windhorst ([id number]) on 08/11/2014 4:44:18 AM       MDM   Final diagnoses:  Dizziness  Stroke, hemorrhagic    53 year old male with 2 episodes of brief dizziness, arm tingling.  Case discussed with Dr. Thad Rangereynolds who recommends head CT and admitted for obscure given recent intervention and stroke.    Olivia Mackielga M Karren Newland, MD 08/11/14 [phone]

## 2014-08-11 NOTE — ED Notes (Signed)
Pt's family called out stating that the back of the pt's head was bleeding. This RN went to check on the pt and found a small area on the back of the pt's head that had blood on it. No active bleeding, and no visible laceration or abrasion.

## 2014-08-11 NOTE — Progress Notes (Signed)
Patient called RN that he felt nauseous and also passing out. H e also said these are the same symptoms he had that brought him to the hospital. Vitals checked stable. CBG was 101. MD informed and ordered antiemetic. Order carry out and patient being monitored closely.

## 2014-08-11 NOTE — Progress Notes (Signed)
Patient arrived to 4N24 AAOx4. Vital signs taken, tele placed, and call bell is by his side. Seizure precautions in place. Questions answered and will continue to monitor.

## 2014-08-11 NOTE — Consult Note (Signed)
Referring Physician: Norlene Campbelltter    Chief Complaint: Right arm weakness and dizziness  HPI: [name] is an 53 y.o. male who was sitting watching the world series last night.  Had the acute onset of dizziness.  Then had numbness and weakness in his right upper extremity.   Symptoms resolved after about 10 minutes.  They recurred again and resolved spontaneously.  Patient is now at baseline.  NIHSS of 3   Date last known well: Date: 08/10/2014 Time last known well: Time: 20:00 tPA Given: No: Resolution of symptoms  Past Medical History  Diagnosis Date  . Asthma     Last asthma attack at age 89; History of trach at 16 months  . COPD (chronic obstructive pulmonary disease) 06/2012    Dx by Zoe LanPenny Jones[facility]  . Headache(784.0)   . Diabetes mellitus without complication   . Stroke     2015  . Hypertension   . GERD (gastroesophageal reflux disease)   . H/O hiatal hernia     Past Surgical History  Procedure Laterality Date  . Tracheostomy closure    . Tracheostomy    . Fracture surgery      NO HX of Broken Bone.  Has rotator cuff tear repair and shoulder/clavicle reconstruction  . Joint replacement      NO HX of JOINT REPLACEMENT.  Rotator cuff tear repair and shoulder reconstruction  . Rectal surgery    . Rotator cuff repair    . Radiology with anesthesia N/A 08/08/2014    Procedure: RADIOLOGY WITH ANESTHESIA EMBOLIZATION;  Surgeon: Medication Radiologist, MD;  Location: [facility] OR;  Service: Radiology;  Laterality: N/A;    Family History  Problem Relation Age of Onset  . Heart disease Mother     s/p 3V CABG  . Cancer Father 3172    lung cancer; +tobacco  . Cancer Maternal Grandmother   . Heart disease Maternal Grandmother   . Cancer Paternal Grandmother   . Lupus Sister   . Multiple sclerosis Sister   . Anemia Daughter   . Stroke Father    Social History:  reports that he quit smoking about 2 months ago. His smoking use included Cigarettes. He started  smoking about 39 years ago. He smoked 1.00 pack per day. He has never used smokeless tobacco. He reports that he drinks about 1.8 ounces of alcohol per week. He reports that he does not use illicit drugs.  Allergies:  Allergies  Allergen Reactions  . Tylox [Oxycodone-Acetaminophen] Hives and Other (See Comments)    Reaction: Tremors and diaphoresis     Medications: I have reviewed the patient's current medications. Scheduled:  ROS: History obtained from the patient  General ROS: negative for - chills, fatigue, fever, night sweats, weight gain or weight loss Psychological ROS: negative for - behavioral disorder, hallucinations, memory difficulties, mood swings or suicidal ideation Ophthalmic ROS: negative for - blurry vision, double vision, eye pain or loss of vision ENT ROS: negative for - epistaxis, nasal discharge, oral lesions, sore throat, tinnitus or vertigo Allergy and Immunology ROS: negative for - hives or itchy/watery eyes Hematological and Lymphatic ROS: negative for - bleeding problems, bruising or swollen lymph nodes Endocrine ROS: negative for - galactorrhea, hair pattern changes, polydipsia/polyuria or temperature intolerance Respiratory ROS: cough Cardiovascular WUJ:WJXBJROS:lower extremity edema  Gastrointestinal ROS: negative for - abdominal pain, diarrhea, hematemesis, nausea/vomiting or stool incontinence Genito-Urinary ROS: negative for - dysuria, hematuria, incontinence or urinary frequency/urgency Musculoskeletal ROS: negative for - joint swelling  or muscular weakness Neurological ROS: as noted in HPI Dermatological ROS: negative for rash and skin lesion changes  Physical Examination: Blood pressure 114/64, pulse 65, temperature 98.1 F (36.7 C), temperature source Oral, resp. rate 17, SpO2 94.00%.  Neurologic Examination: Mental Status: Alert, oriented, thought content appropriate.  Speech fluent without evidence of aphasia.  Able to follow 3 step commands without  difficulty. Cranial Nerves: II: Discs flat bilaterally; Visual fields grossly normal, pupils equal, round, reactive to light and accommodation III,IV, VI: ptosis not present, extra-ocular motions intact bilaterally V,VII: decrease in right NLF, facial light touch sensation normal bilaterally VIII: hearing normal bilaterally IX,X: gag reflex present XI: bilateral shoulder shrug XII: midline tongue extension Motor: Right : Upper extremity   5/5    Left:     Upper extremity   5/5  Lower extremity   5/5     Lower extremity   5/5 Tone and bulk:normal tone throughout; no atrophy noted Sensory: Pinprick and light touch intact throughout, bilaterally Deep Tendon Reflexes: 2+ and symmetric throughout with 1+ AJ's bilaterally Plantars: Right: downgoing   Left: downgoing Cerebellar: normal finger-to-nose and normal heel-to-shin test Gait: normal gait and station CV: pulses palpable throughout     Laboratory Studies:  Basic Metabolic Panel:  Recent Labs Lab 08/09/14 0510 08/11/14 0409  NA 139 138  K 4.0 3.9  CL 103 102  CO2 25 25  GLUCOSE 106* 104*  BUN 9 10  CREATININE 0.82 0.75  CALCIUM 8.8 9.0    Liver Function Tests: No results found for this basename: AST, ALT, ALKPHOS, BILITOT, PROT, ALBUMIN,  in the last 168 hours No results found for this basename: LIPASE, AMYLASE,  in the last 168 hours No results found for this basename: AMMONIA,  in the last 168 hours  CBC:  Recent Labs Lab 08/09/14 0510 08/11/14 0409  WBC 8.2 6.2  NEUTROABS 5.1 2.8  HGB 12.0* 12.0*  HCT 35.4* 35.1*  MCV 87.0 86.7  PLT 176 177    Cardiac Enzymes: No results found for this basename: CKTOTAL, CKMB, CKMBINDEX, TROPONINI,  in the last 168 hours  BNP: No components found with this basename: POCBNP,   CBG:  Recent Labs Lab 08/08/14 0639 08/08/14 1243 08/08/14 2338 08/09/14 0811 08/09/14 1142  GLUCAP 115* 116* 115* 98 107*    Microbiology: Results for orders placed during the  hospital encounter of 12/24/08  RAPID STREP SCREEN     Status: None   Collection Time    12/24/08  9:36 PM      Result Value Ref Range Status   Streptococcus, Group A Screen (Direct) NEGATIVE  NEGATIVE Final    Coagulation Studies: No results found for this basename: LABPROT, INR,  in the last 72 hours  Urinalysis: No results found for this basename: COLORURINE, APPERANCEUR, LABSPEC, PHURINE, GLUCOSEU, HGBUR, BILIRUBINUR, KETONESUR, PROTEINUR, UROBILINOGEN, NITRITE, LEUKOCYTESUR,  in the last 168 hours  Lipid Panel:    Component Value Date/Time   CHOL 166 07/08/2014 0125   TRIG 98 07/08/2014 0125   HDL 35* 07/08/2014 0125   CHOLHDL 4.7 07/08/2014 0125   VLDL 20 07/08/2014 0125   LDLCALC 111* 07/08/2014 0125    HgbA1C:  Lab Results  Component Value Date   HGBA1C 6.0* 07/04/2014    Urine Drug Screen:   No results found for this basename: labopia, cocainscrnur, labbenz, amphetmu, thcu, labbarb    Alcohol Level: No results found for this basename: ETH,  in the last 168 hours  Other results: EKG:  normal sinus rhythm at 67 bpm.  Imaging: Ct Head Wo Contrast  08/11/2014   CLINICAL DATA:  Severe headache today with but tingling in the right hand with nausea and dizziness. Recent right middle cerebral artery infarct. 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 Known right middle cerebral artery aneurysm.  EXAM: CT HEAD WITHOUT CONTRAST  TECHNIQUE: Contiguous axial images were obtained from the base of the skull through the vertex without intravenous contrast.  COMPARISON:  CT scan dated 07/07/2014 and MRI dated 07/04/2014  FINDINGS: The patient has developed focal hemorrhagic transformation of the right middle cerebral artery infarct since the prior CT scan. Edema at the site of the infarct has markedly diminished.  The mass effect upon the right lateral ventricle present on the prior exam has resolved. There is no midline shift. Coils and stents are seen in the left middle  cerebral artery. No new infarction. No osseous abnormality.  IMPRESSION: 1. Focal small areas of hemorrhagic transformation of the right middle cerebral artery infarct in the right insula. No subarachnoid hemorrhage. 2. Marked improvement in edema at the site of the prior infarct with resolution of mass effect upon the right lateral ventricle.   Electronically Signed   By: Geanie CooleyJim  Maxwell M.D.   On: 08/11/2014 04:36    Assessment: 53 y.o. male presenting with complaints of dizziness and right arm numbness and weakness.  Patient hospitalized with acute infarct last month and had aneurysm coiling last week.  Head CT performed today reviewed and shows hemorrhagic transformation of his previous right MCA infarct,  Recent stroke work up performed.  Patient on ASA and Plavix.  This should not cause patient's current symptoms.  Will need to rule out seizure as well.     Stroke Risk Factors - diabetes mellitus and hypertension  Plan: 1. MRI of the brain without contrast 2. Continue ASA and Plavix for now.   3. Repeat head CT in 24 hours 4. Telemetry monitoring 5. Frequent neuro checks 6. EEG 7. Seizure precautions  Case discussed with Dr. Hazle Nordmanntter  Niema Carrara, MD [facility] [phone] 08/11/2014, 5:15 AM

## 2014-08-11 NOTE — H&P (Addendum)
[facility] History and Physical  Ivan BeachRichard [name] VQQ:[id number] DOB: 03/18/1961 DOA: 08/11/2014  Referring physician: EDP PCP: [name],[name], MD   Chief Complaint: R arm weakness and dizziness   HPI: Ivan BeachRichard Consuegra is a 53 y.o. male who suffered a R MCA stroke back on 07/04/14.  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  After consultation with Dr. Corliss Skainseveshwar on 10/2 it was decided to electively coil with stent assistance the L MCA aneurysm.  This was performed on 10/28 (they also found a R MCA aneurysm during the procedure).  Patient was discharged with ASA and Plavix.  He returns to the ED today with R arm weakness, dizziness.  Symptoms onset last night while watching the world series, resolved spontaneously after 10 mins.  They reoccurred again and resolved spontaneously again, and so patient decided to come in to the ED.  Review of Systems: Systems reviewed.  As above, otherwise negative  Past Medical History  Diagnosis Date  . Asthma     Last asthma attack at age 89; History of trach at 16 months  . COPD (chronic obstructive pulmonary disease) 06/2012    Dx by Zoe LanPenny Jones, [facility]  . Headache(784.0)   . Diabetes mellitus without complication   . Stroke     2015  . Hypertension   . GERD (gastroesophageal reflux disease)   . H/O hiatal hernia    Past Surgical History  Procedure Laterality Date  . Tracheostomy closure    . Tracheostomy    . Fracture surgery      NO HX of Broken Bone.  Has rotator cuff tear repair and shoulder/clavicle reconstruction  . Joint replacement      NO HX of JOINT REPLACEMENT.  Rotator cuff tear repair and shoulder reconstruction  . Rectal surgery    . Rotator cuff repair    . Radiology with anesthesia N/A 08/08/2014    Procedure: RADIOLOGY WITH ANESTHESIA EMBOLIZATION;  Surgeon: Medication Radiologist, MD;  Location: [facility] OR;  Service:  Radiology;  Laterality: N/A;   Social History:  reports that he quit smoking about 2 months ago. His smoking use included Cigarettes. He started smoking about 39 years ago. He smoked 1.00 pack per day. He has never used smokeless tobacco. He reports that he drinks about 1.8 ounces of alcohol per week. He reports that he does not use illicit drugs.  Allergies  Allergen Reactions  . Tylox [Oxycodone-Acetaminophen] Hives and Other (See Comments)    Reaction: Tremors and diaphoresis     Family History  Problem Relation Age of Onset  . Heart disease Mother     s/p 3V CABG  . Cancer Father 5072    lung cancer; +tobacco  . Cancer Maternal Grandmother   . Heart disease Maternal Grandmother   . Cancer Paternal Grandmother   . Lupus Sister   . Multiple sclerosis Sister   . Anemia Daughter   . Stroke Father      Prior to Admission medications   Medication Sig Start Date End Date Taking? Authorizing Provider  albuterol (PROVENTIL HFA;VENTOLIN HFA) 108 (90 BASE) MCG/ACT inhaler Inhale 1 puff into the lungs every 6 (six) hours as needed for wheezing or shortness of breath.   Yes Historical Provider, MD  aspirin EC 81 MG tablet Take 81 mg by mouth daily.   Yes Historical Provider, MD  atorvastatin (LIPITOR) 20 MG tablet Take 1  tablet (20 mg total) by mouth daily at 6 PM. 07/06/14  Yes Calvert Cantor, MD  clopidogrel (PLAVIX) 75 MG tablet Take 75 mg by mouth daily.   Yes Historical Provider, MD  HYDROcodone-acetaminophen (NORCO/VICODIN) 5-325 MG per tablet Take 1 tablet by mouth 2 (two) times daily as needed for moderate pain. 07/08/14  Yes Ricki Rodriguez, MD  metFORMIN (GLUCOPHAGE) 500 MG tablet Take 1 tablet (500 mg total) by mouth 2 (two) times daily with a meal. 07/06/14  Yes Calvert Cantor, MD  nitroGLYCERIN (NITROSTAT) 0.4 MG SL tablet Place 1 tablet (0.4 mg total) under the tongue every 5 (five) minutes x 3 doses as needed for chest pain. 07/08/14  Yes Ricki Rodriguez, MD  ramipril (ALTACE) 2.5 MG  capsule Take 2.5 mg by mouth daily.   Yes Historical Provider, MD  tiotropium (SPIRIVA HANDIHALER) 18 MCG inhalation capsule Place 1 capsule (18 mcg total) into inhaler and inhale every morning. 07/06/14  Yes Calvert Cantor, MD   Physical Exam: Filed Vitals:   08/11/14 0600  BP: 110/70  Pulse: 56  Temp:   Resp: 20    BP 110/70  Pulse 56  Temp(Src) 97.7 F (36.5 C) (Oral)  Resp 20  SpO2 95%  General Appearance:    Alert, oriented, no distress, appears stated age  Head:    Normocephalic, atraumatic  Eyes:    PERRL, EOMI, sclera non-icteric        Nose:   Nares without drainage or epistaxis. Mucosa, turbinates normal  Throat:   Moist mucous membranes. Oropharynx without erythema or exudate.  Neck:   Supple. No carotid bruits.  No thyromegaly.  No lymphadenopathy.   Back:     No CVA tenderness, no spinal tenderness  Lungs:     Clear to auscultation bilaterally, without wheezes, rhonchi or rales  Chest wall:    No tenderness to palpitation  Heart:    Regular rate and rhythm without murmurs, gallops, rubs  Abdomen:     Soft, non-tender, nondistended, normal bowel sounds, no organomegaly  Genitalia:    deferred  Rectal:    deferred  Extremities:   No clubbing, cyanosis or edema.  Pulses:   2+ and symmetric all extremities  Skin:   Skin color, texture, turgor normal, no rashes or lesions  Lymph nodes:   Cervical, supraclavicular, and axillary nodes normal  Neurologic:   CNII-XII intact. Normal strength, sensation and reflexes      throughout    Labs on Admission:  Basic Metabolic Panel:  Recent Labs Lab 08/09/14 0510 08/11/14 0409  NA 139 138  K 4.0 3.9  CL 103 102  CO2 25 25  GLUCOSE 106* 104*  BUN 9 10  CREATININE 0.82 0.75  CALCIUM 8.8 9.0   Liver Function Tests: No results found for this basename: AST, ALT, ALKPHOS, BILITOT, PROT, ALBUMIN,  in the last 168 hours No results found for this basename: LIPASE, AMYLASE,  in the last 168 hours No results found for this  basename: AMMONIA,  in the last 168 hours CBC:  Recent Labs Lab 08/09/14 0510 08/11/14 0409  WBC 8.2 6.2  NEUTROABS 5.1 2.8  HGB 12.0* 12.0*  HCT 35.4* 35.1*  MCV 87.0 86.7  PLT 176 177   Cardiac Enzymes: No results found for this basename: CKTOTAL, CKMB, CKMBINDEX, TROPONINI,  in the last 168 hours  BNP (last 3 results) No results found for this basename: PROBNP,  in the last 8760 hours CBG:  Recent Labs Lab 08/08/14 [phone]  08/08/14 1243 08/08/14 2338 08/09/14 0811 08/09/14 1142  GLUCAP 115* 116* 115* 98 107*    Radiological Exams on Admission: Ct Head Wo Contrast  08/11/2014   CLINICAL DATA:  Severe headache today with but tingling in the right hand with nausea and dizziness. Recent right middle cerebral artery infarct. 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 Known right middle cerebral artery aneurysm.  EXAM: CT HEAD WITHOUT CONTRAST  TECHNIQUE: Contiguous axial images were obtained from the base of the skull through the vertex without intravenous contrast.  COMPARISON:  CT scan dated 07/07/2014 and MRI dated 07/04/2014  FINDINGS: The patient has developed focal hemorrhagic transformation of the right middle cerebral artery infarct since the prior CT scan. Edema at the site of the infarct has markedly diminished.  The mass effect upon the right lateral ventricle present on the prior exam has resolved. There is no midline shift. Coils and stents are seen in the left middle cerebral artery. No new infarction. No osseous abnormality.  IMPRESSION: 1. Focal small areas of hemorrhagic transformation of the right middle cerebral artery infarct in the right insula. No subarachnoid hemorrhage. 2. Marked improvement in edema at the site of the prior infarct with resolution of mass effect upon the right lateral ventricle.   Electronically Signed   By: Geanie CooleyJim  Maxwell M.D.   On: 08/11/2014 04:36    EKG: Independently reviewed.  Assessment/Plan Active Problems:    Cerebral hemorrhage   ICH (intracerebral hemorrhage)   1. Hemorrhagic conversion of R sided MCA ischemic stroke - does NOT explain the patients R sided symptoms. 1. Currently asymptomatic 2. Very small area of hemorrhage, Dr. Thad Rangereynolds has made the decision to keep patient on his ASA and plavix despite the new hemorrhagic conversion due to the fear of the L MCA stent occluding.  She says that their team will call Dr. Corliss Skainseveshwar as soon as he gets in tommorow morning.  I discussed this with patient and family and they understand that on the anticoagulant there is a risk that the ICH may be worse than it would be off of the anticoagulant, but this is weighed against the risk of left sided ischemic MCA from stent occlusion. 3. MRI brain 4. CT head in 24 hours 5. Frequent neuro checks. 6. Seizure precautions 7. EEG 2. HTN - continue home meds 3. HLD - continue statin 4. DM2 - continue metformin    Code Status: Full Code  Family Communication: Family at bedside, long discussion as above. Disposition Plan: Admit to inpatient   Time spent: 70 min  GARDNER, JARED M. [facility] Pager [phone]  If 7AM-7PM, please contact the day team taking care of the patient [email or web address] Password [email or web address] 08/11/2014, 6:17 AM

## 2014-08-11 NOTE — ED Notes (Signed)
Pt is from home. Pt was discharged yesterday after having 2 ischemic strokes. Pt had stents placed in his brain to fix an aneurysm, and is due to have more stents placed in 3 weeks. Pt reports dizziness and tingling in the right arm at 2030 yesterday, and symptoms resolved 25 minutes later. Pt states that these were the symptoms he had during his last stroke. Pt passed EMS stroke scale. CBG 113. Denies symptoms at this time.

## 2014-08-11 NOTE — Progress Notes (Signed)
[facility] PROGRESS NOTE  Artist BeachRichard Slaymaker UJW:[id number] DOB: 10/07/1961 DOA: 08/11/2014 PCP: [name],[name], MD  Assessment/Plan: 1. 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 Gerilyn Pilgrimoonquah of neurology, we reviewed imaging studies. He recommended continuing antithrombotic therapy with Aspirin and Plavix -Patient had presented with right upper extremity weakness would not coincide with the extension of right MCA territory infarct -Radiology reporting that hemorrhagic transformation did not appear to be acute, however did occur in the interval since September -Await further recommendations from neurology  2.  Right upper extremity weakness -Resolved -As mentioned above not due to extension of right MCA territory infarct seen on brain imaging studies -Now resolved. -MRI did not reveal acute infarct on the left. Could be secondary to TIA -Continue anti-thrombotic therapy with aspirin and Plavix as recommended by neurology.  3. History of left MCA territory aneurysm -Status post coiling and stent placements procedure performed on 08/08/2014  4.  Dyslipidemia. 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 He underwent elective coiling with stent placement on 08/08/2014. Patient presenting to the emergency room overnight with complaints of right arm weakness and dizziness. Initial CT scan of brain showed focal small areas of hemorrhagic transformation on the right middle cerebral artery infarct in the right insula. This was followed up with an MRI of the brain which revealed  hemorrhagic transformation of right MCA territory infarct, in part volume increased compared to previous study on 07/04/2014 radiology reporting that this did not appear to be acute and the exact time of extension of hemorrhage cannot be determined on this study.   Objective: Filed Vitals:   08/11/14 1009  BP: 119/64  Pulse: 65  Temp: 98.1 F (36.7 C)  Resp: 18    Intake/Output Summary (Last 24 hours) at 08/11/14 1557 Last data filed at 08/11/14 1115  Gross per 24 hour  Intake      0 ml  Output    425 ml  Net   -425 ml   Filed Weights   08/11/14 [id number]  Weight: 88.769 kg (195 lb 11.2 oz)    Exam:   General:  Patient is in no acute distress, reports resolution to his right upper extremity weakness  Cardiovascular: Regular rate and rhythm normal S1-S2 no extremity edema  Respiratory: Clear to auscultation bilaterally  Abdomen: Soft nontender nondistended  Musculoskeletal: No edema  Neurological: Has 5 of 5 muscle strength to bilateral upper and lower extremities, no alteration to sensation, cranial nerves II though XII grossly intact  Data Reviewed: Basic Metabolic Panel:  Recent Labs Lab 08/09/14 0510 08/11/14 0409  NA 139 138  K 4.0 3.9  CL 103 102  CO2 25 25  GLUCOSE 106* 104*  BUN 9 10  CREATININE 0.82 0.75  CALCIUM 8.8 9.0   Liver Function Tests: No results found for this basename: AST, ALT, ALKPHOS, BILITOT, PROT, ALBUMIN,  in the last 168 hours No results found for this basename: LIPASE, AMYLASE,  in the last 168 hours No results found for this basename: AMMONIA,  in the last 168 hours CBC:  Recent Labs Lab 08/09/14 0510 08/11/14 0409  WBC 8.2 6.2  NEUTROABS 5.1 2.8  HGB 12.0* 12.0*  HCT 35.4* 35.1*  MCV 87.0 86.7  PLT 176 177   Cardiac Enzymes: No results found for this basename: CKTOTAL, CKMB, CKMBINDEX, TROPONINI,  in the last 168 hours BNP (last 3 results) No results found for this basename: PROBNP,  in the last 8760  hours CBG:  Recent Labs Lab 08/08/14 1243 08/08/14 2338 08/09/14 0811 08/09/14 1142 08/11/14 1146  GLUCAP 116* 115* 98 107* 106*    No results found for this or any previous visit (from the past 240 hour(s)).   Studies: Ct Head Wo Contrast  08/11/2014   CLINICAL DATA:  Severe headache today with but tingling in the right hand with nausea and dizziness. Recent right middle cerebral artery infarct. 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 Known right middle cerebral artery aneurysm.  EXAM: CT HEAD WITHOUT CONTRAST  TECHNIQUE: Contiguous axial images were obtained from the base of the skull through the vertex without intravenous contrast.  COMPARISON:  CT scan dated 07/07/2014 and MRI dated 07/04/2014  FINDINGS: The patient has developed focal hemorrhagic transformation of the right middle cerebral artery infarct since the prior CT scan. Edema at the site of the infarct has markedly diminished.  The mass effect upon the right lateral ventricle present on the prior exam has resolved. There is no midline shift. Coils and stents are seen in the left middle cerebral artery. No new infarction. No osseous abnormality.  IMPRESSION: 1. Focal small areas of hemorrhagic transformation of the right middle cerebral artery infarct in the right insula. No subarachnoid hemorrhage. 2. Marked improvement in edema at the site of the prior infarct with resolution of mass effect upon the right lateral ventricle.   Electronically Signed   By: Geanie Cooley M.D.   On: 08/11/2014 04:36   [name]  08/11/2014   CLINICAL DATA:  Right MCA infarct 07/03/2014.  Elective coiling of left MCA aneurysm 08/08/2014.  Acute onset right arm weakness and dizziness 08/11/2014.  EXAM: MRI HEAD WITHOUT CONTRAST  TECHNIQUE: Multiplanar, multiecho pulse sequences of the brain and surrounding structures were obtained without intravenous contrast.  COMPARISON:  CT 08/11/2014.  MRI 07/04/2014  FINDINGS: Right  MCA infarct shows extension and interval hemorrhage since the MRI of 07/04/2014. Infarct involves the putamen, and caudate. Interval hemorrhage is present in the right basal ganglia. There is also infarction involving the insula and frontal operculum with interval hemorrhage. Overall area of infarction has increased in size since the prior MRI. It is not possible to accurately determine when the infarct extension occurred. There is a small area of residual restricted diffusion in the right parietal white matter unchanged from the prior study.  No acute infarct in the left hemisphere. No acute infarct in the brainstem or cerebellum.  Ventricle size is normal.  Negative for mass lesion.  Interval coiling of left MCA aneurysm.  Decreased caliber the right internal carotid artery due to slow flow.  IMPRESSION: Hemorrhagic transformation of right MCA infarct. The infarct volume has increased in size since 07/04/2014 consistent with extension however this does not appear to be acute. The exact time of the infarct extension hemorrhage cannot be determined on this study but occurred in the interval.  No acute infarct on the left.  Interval coiling of left MCA aneurysm.   Electronically Signed   By: Marlan Palau M.D.   On: 08/11/2014 10:10   Dg Chest Port 1 View  08/11/2014   CLINICAL DATA:  Nonproductive  cough.  EXAM: PORTABLE CHEST - 1 VIEW  COMPARISON:  07/07/2014  FINDINGS: The heart size and mediastinal contours are within normal limits. Both lungs are clear. The visualized skeletal structures are unremarkable.  IMPRESSION: No significant abnormalities.   Electronically Signed   By: Geanie CooleyJim  Maxwell M.D.   On: 08/11/2014 06:26    Scheduled Meds: .  stroke: mapping our early stages of recovery book   Does not apply Once  . aspirin EC  81 mg Oral Daily  . atorvastatin  20 mg Oral q1800  . clopidogrel  75 mg Oral Daily  . metFORMIN  500 mg Oral BID WC  . pantoprazole  40 mg Oral Daily  . ramipril  2.5 mg Oral  Daily  . tiotropium  18 mcg Inhalation Daily   Continuous Infusions:   Active Problems:   DM type 2 causing vascular disease   Brain aneurysm   Cerebral hemorrhage   ICH (intracerebral hemorrhage)    Time spent: 35 min    [name], Gavrielle Streck  [facility] Pager [phone]. If 7PM-7AM, please contact night-coverage at [email or web address], password [email or web address] 08/11/2014, 3:57 PM  LOS: 0 days

## 2014-08-11 NOTE — Progress Notes (Signed)
Follow-up note  I spoke with Dr. Corliss Skainseveshwar who recommended increasing his aspirin dose from 81mg  to 325 mg by mouth daily and continuing Plavix 75 mg by mouth daily. He also recommended starting IV fluids with normal saline at 100 ML's per hour.

## 2014-08-12 ENCOUNTER — Inpatient Hospital Stay (HOSPITAL_COMMUNITY): Payer: Medicaid Other

## 2014-08-12 DIAGNOSIS — I671 Cerebral aneurysm, nonruptured: Principal | ICD-10-CM

## 2014-08-12 DIAGNOSIS — I619 Nontraumatic intracerebral hemorrhage, unspecified: Secondary | ICD-10-CM

## 2014-08-12 DIAGNOSIS — E1151 Type 2 diabetes mellitus with diabetic peripheral angiopathy without gangrene: Secondary | ICD-10-CM

## 2014-08-12 LAB — LIPID PANEL
CHOLESTEROL: 114 mg/dL (ref 0–200)
HDL: 31 mg/dL — ABNORMAL LOW (ref >39)
LDL Cholesterol: 65 mg/dL (ref 0–99)
Total CHOL/HDL Ratio: 3.7 RATIO
Triglycerides: 90 mg/dL (ref <150)
VLDL: 18 mg/dL (ref 0–40)

## 2014-08-12 LAB — HEMOGLOBIN A1C
HEMOGLOBIN A1C: 5.9 % — AB (ref <5.7)
MEAN PLASMA GLUCOSE: 123 mg/dL — AB (ref <117)

## 2014-08-12 LAB — PLATELET INHIBITION P2Y12: PLATELET FUNCTION P2Y12: 6 [PRU] — AB (ref 194–418)

## 2014-08-12 LAB — GLUCOSE, CAPILLARY
GLUCOSE-CAPILLARY: 111 mg/dL — AB (ref 70–99)
Glucose-Capillary: 129 mg/dL — ABNORMAL HIGH (ref 70–99)
Glucose-Capillary: 84 mg/dL (ref 70–99)

## 2014-08-12 MED ORDER — HYDROCODONE-ACETAMINOPHEN 5-325 MG PO TABS
1.0000 | ORAL_TABLET | Freq: Once | ORAL | Status: AC
Start: 2014-08-12 — End: 2014-08-12
  Administered 2014-08-12: 1 via ORAL
  Filled 2014-08-12: qty 1

## 2014-08-12 NOTE — Progress Notes (Signed)
STROKE TEAM PROGRESS NOTE   HISTORY [name] is a 53 y.o. male who was sitting watching the world series last night. Had the acute onset of dizziness. Then had numbness and weakness in his right upper extremity. Symptoms resolved after about 10 minutes. They recurred again and resolved spontaneously. Patient is now at baseline. NIHSS of 3   Date last known well: Date: 08/10/2014 Time last known well: Time: 20:00 tPA Given: No: Resolution of symptoms   DC SUMMARY: 08-08-14 ZOX:WRUEAVWHPI:[name] is a 53 y.o. 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 See Op report for full details. He was admitted to the neuro ICU in stable condition. BP remained stable. Pt feels well POD #1. No post op complications or events noted. Pt is determined to be stable for discharge. Has been given ASA and Plavix as directed. All instructions, restrictions, medications, and follow up plans have been with patient and his mother. Pt will be scheduled for 2 week follow up appointment.   SUBJECTIVE (INTERVAL HISTORY)  The patient complains of having episodic headaches involving the right occipital area. The headaches last for about 30 minutes. He grades the headaches about 5/10. It appears he is had a few episodes that are quite unusual. The initial event started with the patient having vertiginous sensation as if he is on a ship and is rocking. This lasted for about 10 minutes. He had another episode of triple vision with the objects being  protocol while watching television. It appears that the rocking sensation has happened about 4 times a week lasting for about 10 minutes. He also had the tingling involving the right upper extremity which lasted for about 30 minutes. It appears that he also had auditory hallucinations mostly from hearing his mother calling him but she was not around. He also had a couple spells where his upper extremities were shaking when laying down. Despite these spells happen only for a few seconds.   OBJECTIVE Temp:  [97.7 F (36.5 C)-98.6 F (37 C)] 97.9 F (36.6 C) (11/01 0700) Pulse Rate:  [59-65] 60 (11/01 0700) Cardiac Rhythm:  [-]  Resp:  [16-22] 20 (11/01 0700) BP: (104-144)/(56-82) 109/56 mmHg (11/01 0700) SpO2:  [95 %-100 %] 95 % (11/01 0700)   Recent Labs Lab 08/09/14 0811 08/09/14 1142 08/11/14 1146 08/11/14 1555 08/11/14 2137  GLUCAP 98 107* 106* 101* 92    Recent Labs Lab 08/09/14 0510 08/11/14 0409  NA 139 138  K 4.0 3.9  CL 103 102  CO2 25 25  GLUCOSE 106* 104*  BUN 9 10  CREATININE 0.82 0.75  CALCIUM 8.8 9.0   No results for input(s): AST, ALT, ALKPHOS, BILITOT, PROT, ALBUMIN in the last 168 hours.  Recent Labs Lab 08/09/14 0510 08/11/14 0409  WBC 8.2 6.2  NEUTROABS 5.1 2.8  HGB 12.0* 12.0*  HCT 35.4* 35.1*  MCV 87.0 86.7  PLT 176 177   No results for input(s): CKTOTAL, CKMB, CKMBINDEX, TROPONINI in the last 168 hours. No results for input(s): LABPROT, INR in the last 72 hours. No results for input(s): COLORURINE,  LABSPEC, PHURINE, GLUCOSEU, HGBUR, BILIRUBINUR, KETONESUR, PROTEINUR, UROBILINOGEN, NITRITE, LEUKOCYTESUR in the last 72 hours.  Invalid input(s): APPERANCEUR     Component Value Date/Time   CHOL 114 08/12/2014 0653   TRIG 90 08/12/2014 0653   HDL 31* 08/12/2014 0653   CHOLHDL 3.7 08/12/2014 0653   VLDL 18 08/12/2014 0653   LDLCALC 65 08/12/2014 0653   Lab Results  Component Value Date   HGBA1C 6.0* 07/04/2014   No results found for:  LABOPIA, COCAINSCRNUR, LABBENZ, AMPHETMU, THCU, LABBARB  No results for input(s): ETH in the last 168 hours.  Ct Head Wo Contrast 08/12/2014    Stable right hemorrhagic infarct involving the insular and Barre insular cortex in the right MCA distribution. No increasing mass effect. No new infarct. No increasing hemorrhage.      Ct Head Wo Contrast 08/11/2014    1. Focal small areas of hemorrhagic transformation of the right middle cerebral artery infarct in the right insula. No subarachnoid hemorrhage.  2. Marked improvement in edema at the site of the prior infarct with resolution of mass effect upon the right lateral ventricle.     [name] 08/11/2014    Hemorrhagic transformation of right MCA infarct. The infarct volume has increased in size since 07/04/2014 consistent with extension however this does not appear to be acute. The exact time of the infarct extension hemorrhage cannot be determined on this study but occurred in the interval.  No acute infarct on the left.  Interval coiling of left MCA aneurysm.      Dg Chest Port 1 View 08/11/2014    No significant abnormalities.     PHYSICAL EXAM  GENERAL: Pleasant well-built man in no acute distress.  HEENT: Supple. Atraumatic normocephalic.   ABDOMEN: soft  EXTREMITIES: No edema   BACK: Normal.  SKIN: Normal by inspection.    MENTAL STATUS: Alert and oriented. Speech, language and cognition are generally intact. Judgment and insight normal.   CRANIAL NERVES: Pupils are equal, round and reactive to light and accommodation; extra ocular movements are full, there is no significant nystagmus; visual fields are full; upper and lower facial muscles are normal in strength and symmetric, there is no flattening of the nasolabial folds; tongue is midline; uvula is midline; shoulder elevation is normal.  MOTOR: Normal tone, bulk and strength; no pronator drift.  COORDINATION: Left finger to nose is normal, right finger to  nose is normal, No rest tremor; no intention tremor; no postural tremor; no bradykinesia.  SENSATION: Normal to light touch.   ASSESSMENT/PLAN [name] is a 53 y.o. male with history of left middle cerebral artery aneurysm coiling, diabetes mellitus, right middle cerebral artery stroke in 07/04/2014, residual right internal carotid artery aneurysm, with residual right middle cerebral artery aneurysm,presenting with dizziness and right upper extremity numbness. He did not receive IV t-PA as his deficits resolved.  Stroke - Hemorrhagic transformation of right MCA infarct. Non-dominant.    MRI - as above  MRA - not ordered  Carotid Doppler - 07/05/2014 - Right ICA is occluded. Left: mild soft plaque origin ICA. 40-50% ICA stenosis.  2D Echo - indication fraction 55-60%. No cardiac source of emboli identified.  LDL - 65  HgbA1c pending  SCDs for VTE prophylaxis  Diet Carb Modified diet with thin liquids  Up with assistance  aspirin 81 mg orally every day and clopidogrel 75 mg orally every day prior to admission, now on aspirin 325 mg orally every day and clopidogrel 75 mg  orally every day  Patient counseled to be compliant with his antithrombotic medications  Risk factor education  Ongoing aggressive risk factor management  Resultant - resolution of deficits  Therapy recommendations:  pending  Disposition:  pending   EEG-P  Hypertension  Home meds:   Altace 2.5 mg daily - continued at time of admission.  Mildly low at times  Hyperlipidemia  Home meds:  Lipitor 20 mg daily - resumed in hospital  LDL 65 goal < 70  Continue statin at discharge  Diabetes  HgbA1c pending goal < 7.0  Controlled  Other Stroke Risk Factors  Cigarette smoker, quit smoking 2 months ago ETOH use . Obesity, Body mass index is 29.76 kg/(m^2).  [name] Hx stroke/TIA . Family hx stroke (father)   Other Active Problems    Other Pertinent History  Asthma / COPD  Hospital  day # 1  Hassel Neth [facility] Pager [phone] 08/12/2014, 8:17 AM  The patient was seen and examined by me; notes, chart and tests reviewed and discussed with midlevel provider, other providers, patient, and family.      To contact Stroke Continuity provider, please refer to [email or web address]. After hours, contact General Neurology

## 2014-08-12 NOTE — Progress Notes (Signed)
[facility] PROGRESS NOTE  Artist BeachRichard Haith ZOX:[id number] DOB: 09/08/1961 DOA: 08/11/2014 PCP: [name],[name], MD  Assessment/Plan: 1. 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 Gerilyn Pilgrimoonquah of neurology, we reviewed imaging studies. He recommended continuing antithrombotic therapy with Aspirin and Plavix -Patient had presented with right upper extremity weakness would not coincide with the extension of right MCA territory infarct -Radiology reporting that hemorrhagic transformation did not appear to be acute, however did occur in the interval since September -I spoke with Dr. Corliss Skainseveshwar on 08/12/2014 who recommended continuing aspirin 325 mg by mouth daily along with Plavix and a 5 mg by mouth daily  2.  Right upper extremity weakness -Resolved -As mentioned above not due to extension of right MCA territory infarct seen on brain imaging studies -Now resolved. -MRI did not reveal acute infarct on the left. Could be secondary to TIA -Continue anti-thrombotic therapy with aspirin and Plavix as recommended by neurology.  3. History of left MCA territory aneurysm -Status post coiling and stent placements procedure performed on 08/08/2014  4.  Dyslipidemia. 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 He underwent elective coiling with stent placement on 08/08/2014. Patient presenting to the emergency room overnight with complaints of right arm weakness and dizziness. Initial CT scan of brain showed focal small areas of hemorrhagic transformation on the right  middle cerebral artery infarct in the right insula. This was followed up with an MRI of the brain which revealed hemorrhagic transformation of right MCA territory infarct, in part volume increased compared to previous study on 07/04/2014 radiology reporting that this did not appear to be acute and the exact time of extension of hemorrhage cannot be determined on this study.   Objective: Filed Vitals:   08/12/14 1041  BP: 113/68  Pulse: 58  Temp: 98.2 F (36.8 C)  Resp: 20    Intake/Output Summary (Last 24 hours) at 08/12/14 1332 Last data filed at 08/12/14 1042  Gross per 24 hour  Intake      0 ml  Output   1200 ml  Net  -1200 ml   Filed Weights   08/11/14 [id number]  Weight: 88.769 kg (195 lb 11.2 oz)    Exam:   General:  Patient is in no acute distress, reports resolution to his right upper extremity weakness  Cardiovascular: Regular rate and rhythm normal S1-S2 no extremity edema  Respiratory: Clear to auscultation bilaterally  Abdomen: Soft nontender nondistended  Musculoskeletal: No edema  Neurological: Has 5 of 5 muscle strength to bilateral upper and lower extremities, no alteration to sensation, cranial nerves II though XII grossly intact  Data Reviewed: Basic Metabolic Panel:  Recent Labs Lab 08/09/14 0510 08/11/14 0409  NA 139 138  K 4.0 3.9  CL 103 102  CO2 25 25  GLUCOSE 106* 104*  BUN 9 10  CREATININE 0.82 0.75  CALCIUM 8.8 9.0   Liver Function Tests: No results for input(s): AST, ALT, ALKPHOS, BILITOT, PROT, ALBUMIN in the last 168 hours. No results for input(s): LIPASE, AMYLASE in the last 168 hours. No results for input(s): AMMONIA in the last 168 hours.  CBC:  Recent Labs Lab 08/09/14 0510 08/11/14 0409  WBC 8.2 6.2  NEUTROABS 5.1 2.8  HGB 12.0* 12.0*  HCT 35.4* 35.1*  MCV 87.0 86.7  PLT 176 177   Cardiac Enzymes: No results for input(s): CKTOTAL, CKMB, CKMBINDEX, TROPONINI in the last 168 hours. BNP (last 3 results) No results for  input(s): PROBNP in the last 8760 hours. CBG:  Recent Labs Lab 08/09/14 1142 08/11/14 1146 08/11/14 1555 08/11/14 2137 08/12/14 1151  GLUCAP 107* 106* 101* 92 111*    No results found for this or any previous visit (from the past 240 hour(s)).   Studies: Ct Head Wo Contrast  08/12/2014   CLINICAL DATA:  Right-sided aneurysm coiling. Dizziness. Hemorrhage.  EXAM: CT HEAD WITHOUT CONTRAST  TECHNIQUE: Contiguous axial images were obtained from the base of the skull through the vertex without intravenous contrast.  COMPARISON:  08/11/2014  FINDINGS: Parenchymal hemorrhage and edema involving the cortex about the right sylvian fissure, in the distribution of the right MCA is stable. There is low-density involving the insular cortex compatible with infarct. These findings have not significantly changed. There is no midline shift. The ventricular system is stable. Aneurysm coils in the left MCA are stable. No subarachnoid hemorrhage. No intraventricular hemorrhage. No new infarct.  IMPRESSION: Stable right hemorrhagic infarct involving the insular and Barre insular cortex in the right MCA distribution. No increasing mass effect. No new infarct. No increasing hemorrhage.   Electronically Signed   By: Maryclare BeanArt  Hoss M.D.   On: 08/12/2014 07:24   Ct Head Wo Contrast  08/11/2014   CLINICAL DATA:  Severe headache today with but tingling in the right hand with nausea and dizziness. Recent right middle cerebral artery infarct. 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 Known right middle cerebral artery aneurysm.  EXAM: CT HEAD WITHOUT CONTRAST  TECHNIQUE: Contiguous axial images were obtained from the base of the skull through the vertex without intravenous contrast.  COMPARISON:  CT scan dated 07/07/2014 and MRI dated 07/04/2014  FINDINGS: The patient has developed focal hemorrhagic transformation of the right middle cerebral artery infarct since the prior CT scan. Edema at the site of the  infarct has markedly diminished.  The mass effect upon the right lateral ventricle present on the prior exam has resolved. There is no midline shift. Coils and stents are seen in the left middle cerebral artery. No new infarction. No osseous abnormality.  IMPRESSION: 1. Focal small areas of hemorrhagic transformation of the right middle cerebral artery infarct in the right insula. No subarachnoid hemorrhage. 2. Marked improvement in edema at the site of the prior infarct with resolution of mass effect upon the right lateral ventricle.   Electronically Signed   By: Geanie CooleyJim  Maxwell M.D.   On: 08/11/2014 04:36   [name]  08/11/2014   CLINICAL DATA:  Right MCA infarct 07/03/2014.  Elective coiling of left MCA aneurysm 08/08/2014.  Acute onset right arm weakness and dizziness 08/11/2014.  EXAM: MRI HEAD WITHOUT CONTRAST  TECHNIQUE: Multiplanar, multiecho pulse sequences of the brain and surrounding structures were obtained without intravenous contrast.  COMPARISON:  CT 08/11/2014.  MRI 07/04/2014  FINDINGS: Right MCA infarct shows extension and interval hemorrhage since the MRI of 07/04/2014. Infarct involves the putamen, and caudate. Interval hemorrhage is present in the right basal ganglia. There is also infarction involving the insula and frontal operculum with interval hemorrhage. Overall area of infarction has increased in size since the prior MRI. It is not possible  to accurately determine when the infarct extension occurred. There is a small area of residual restricted diffusion in the right parietal white matter unchanged from the prior study.  No acute infarct in the left hemisphere. No acute infarct in the brainstem or cerebellum.  Ventricle size is normal.  Negative for mass lesion.  Interval coiling of left MCA aneurysm.  Decreased caliber the right internal carotid artery due to slow flow.  IMPRESSION: Hemorrhagic transformation of right MCA infarct. The infarct volume has increased in size  since 07/04/2014 consistent with extension however this does not appear to be acute. The exact time of the infarct extension hemorrhage cannot be determined on this study but occurred in the interval.  No acute infarct on the left.  Interval coiling of left MCA aneurysm.   Electronically Signed   By: Marlan Palau M.D.   On: 08/11/2014 10:10   Dg Chest Port 1 View  08/11/2014   CLINICAL DATA:  Nonproductive cough.  EXAM: PORTABLE CHEST - 1 VIEW  COMPARISON:  07/07/2014  FINDINGS: The heart size and mediastinal contours are within normal limits. Both lungs are clear. The visualized skeletal structures are unremarkable.  IMPRESSION: No significant abnormalities.   Electronically Signed   By: Geanie Cooley M.D.   On: 08/11/2014 06:26    Scheduled Meds: .  stroke: mapping our early stages of recovery book   Does not apply Once  . aspirin EC  325 mg Oral Daily  . atorvastatin  20 mg Oral q1800  . clopidogrel  75 mg Oral Daily  . metFORMIN  500 mg Oral BID WC  . pantoprazole  40 mg Oral Daily  . ramipril  2.5 mg Oral Daily  . tiotropium  18 mcg Inhalation Daily   Continuous Infusions: . sodium chloride 100 mL/hr at 08/11/14 1655    Active Problems:   DM type 2 causing vascular disease   Brain aneurysm   Cerebral hemorrhage   ICH (intracerebral hemorrhage)    Time spent: 20 min    Jeralyn Bennett  [facility] Pager [phone]. If 7PM-7AM, please contact night-coverage at [email or web address], password [email or web address] 08/12/2014, 1:32 PM  LOS: 1 day

## 2014-08-13 ENCOUNTER — Inpatient Hospital Stay (HOSPITAL_COMMUNITY): Payer: Medicaid Other

## 2014-08-13 DIAGNOSIS — R29898 Other symptoms and signs involving the musculoskeletal system: Secondary | ICD-10-CM

## 2014-08-13 LAB — PLATELET INHIBITION P2Y12: Platelet Function  P2Y12: 4 [PRU] — ABNORMAL LOW (ref 194–418)

## 2014-08-13 LAB — GLUCOSE, CAPILLARY
GLUCOSE-CAPILLARY: 147 mg/dL — AB (ref 70–99)
GLUCOSE-CAPILLARY: 94 mg/dL (ref 70–99)
Glucose-Capillary: 88 mg/dL (ref 70–99)
Glucose-Capillary: 96 mg/dL (ref 70–99)
Glucose-Capillary: 96 mg/dL (ref 70–99)

## 2014-08-13 MED ORDER — POLYETHYLENE GLYCOL 3350 17 G PO PACK
17.0000 g | PACK | Freq: Every day | ORAL | Status: DC
  Administered 2014-08-13 – 2014-08-14 (×2): 17 g via ORAL
  Filled 2014-08-13 (×2): qty 1

## 2014-08-13 MED ORDER — LACTULOSE 10 GM/15ML PO SOLN: 20.0000 g | Freq: Two times a day (BID) | ORAL | Status: DC | PRN

## 2014-08-13 MED ORDER — DOCUSATE SODIUM 100 MG PO CAPS
100.0000 mg | ORAL_CAPSULE | Freq: Two times a day (BID) | ORAL | Status: DC
  Administered 2014-08-13 – 2014-08-14 (×2): 100 mg via ORAL
  Filled 2014-08-13 (×2): qty 1

## 2014-08-13 MED ORDER — ASPIRIN 81 MG PO CHEW: 81.0000 mg | CHEWABLE_TABLET | Freq: Every day | ORAL | Status: DC

## 2014-08-13 MED ORDER — ASPIRIN 325 MG PO TABS
325.0000 mg | ORAL_TABLET | Freq: Every day | ORAL | Status: DC
  Administered 2014-08-14: 325 mg via ORAL
  Filled 2014-08-13: qty 1

## 2014-08-13 MED ORDER — CLOPIDOGREL BISULFATE 75 MG PO TABS
37.5000 mg | ORAL_TABLET | Freq: Every day | ORAL | Status: DC
  Administered 2014-08-14: 37.5 mg via ORAL
  Filled 2014-08-13: qty 1

## 2014-08-13 NOTE — Progress Notes (Signed)
STROKE TEAM PROGRESS NOTE   HISTORY [name] is a 53 y.o. male who was sitting watching the world series last night. Had the acute onset of dizziness. Then had numbness and weakness in his right upper extremity. Symptoms resolved after about 10 minutes. They recurred again and resolved spontaneously. Patient is now at baseline. NIHSS of 3   Date last known well: Date: 08/10/2014 Time last known well: Time: 20:00 tPA Given: No: Resolution of symptoms   DC SUMMARY: 08-08-14 NUU:[id number]:Ivan Shawnee KnappLevy is a 53 y.o. 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 See Op report for full details. He was admitted to the neuro ICU in stable condition. BP remained stable. Pt feels well POD #1. No post op complications or events noted. Pt is determined to be stable for discharge. Has been given ASA and Plavix as directed. All instructions, restrictions, medications, and follow up plans have been with patient and his mother. Pt will be scheduled for 2 week follow up appointment.   SUBJECTIVE (INTERVAL HISTORY) Wife at bedside. Patient reports he is now off the "trial" medicine.   OBJECTIVE Temp:  [97.6 F (36.4 C)-98.8 F (37.1 C)] 97.6 F (36.4 C) (11/02 0533) Pulse Rate:  [56-66] 66 (11/02 0533) Cardiac Rhythm:  [-] Normal sinus rhythm (11/02 0400) Resp:  [18-20] 20 (11/02 0533) BP: (103-125)/(52-75) 125/75 mmHg (11/02 1052) SpO2:  [96 %-99 %] 96 % (11/02 0533)   Recent Labs Lab 08/12/14 1151 08/12/14 1649  08/12/14 2149 08/13/14 0730 08/13/14 1140  GLUCAP 111* 129* 84 96 96    Recent Labs Lab 08/09/14 0510 08/11/14 0409  NA 139 138  K 4.0 3.9  CL 103 102  CO2 25 25  GLUCOSE 106* 104*  BUN 9 10  CREATININE 0.82 0.75  CALCIUM 8.8 9.0   No results for input(s): AST, ALT, ALKPHOS, BILITOT, PROT, ALBUMIN in the last 168 hours.  Recent Labs Lab 08/09/14 0510 08/11/14 0409  WBC 8.2 6.2  NEUTROABS 5.1 2.8  HGB 12.0* 12.0*  HCT 35.4* 35.1*  MCV 87.0 86.7  PLT 176 177   No results for input(s): CKTOTAL, CKMB, CKMBINDEX, TROPONINI in the last 168 hours. No results for input(s): LABPROT, INR in the last 72 hours. No results for input(s): COLORURINE, LABSPEC, PHURINE, GLUCOSEU, HGBUR, BILIRUBINUR, KETONESUR, PROTEINUR, UROBILINOGEN, NITRITE, LEUKOCYTESUR in the last 72 hours.  Invalid input(s): APPERANCEUR     Component Value Date/Time   CHOL 114 08/12/2014 0653   TRIG 90 08/12/2014 0653   HDL 31* 08/12/2014 0653   CHOLHDL 3.7 08/12/2014 0653   VLDL 18 08/12/2014 0653   LDLCALC 65 08/12/2014 0653   Lab Results  Component Value Date   HGBA1C 5.9* 08/12/2014   No results found for: LABOPIA, COCAINSCRNUR, LABBENZ, AMPHETMU, THCU, LABBARB  No results for input(s): ETH in the last 168 hours.  Ct Head Wo Contrast 08/12/2014   Stable right hemorrhagic infarct involving the insular and Barre insular cortex in the right MCA distribution. No increasing mass effect. No new infarct. No increasing hemorrhage.    08/11/2014   1. Focal small  areas of hemorrhagic transformation of the right middle cerebral artery infarct in the right insula. No subarachnoid hemorrhage. 2. Marked improvement in edema at the site of the prior infarct with resolution of mass effect upon the right lateral ventricle.     [name] Wo Contrast 08/11/2014    Hemorrhagic transformation of right MCA infarct. The infarct volume has increased in size since 07/04/2014 consistent with extension however this does not  appear to be acute. The exact time of the infarct extension hemorrhage cannot be determined on this study but occurred in the interval.  No acute infarct on the left.  Interval coiling of left MCA aneurysm.     Dg Chest Port 1 View 08/11/2014    No significant abnormalities.    EEG    PHYSICAL EXAM GENERAL: Pleasant well-built Caucasian male in no acute distress. HEENT: Supple. Atraumatic normocephalic.  ABDOMEN: soft EXTREMITIES: No edema  BACK: Normal. SKIN: Normal by inspection. MENTAL STATUS: Alert and oriented. Speech, language and cognition are generally intact. Judgment and insight normal.  CRANIAL NERVES: Pupils are equal, round and reactive to light and accommodation;Fundi not visualized extra ocular movements are full, there is no significant nystagmus; visual fields are full; upper and lower facial muscles are normal in strength and symmetric, there is no flattening of the nasolabial folds; tongue is midline; uvula is midline; shoulder elevation is normal. MOTOR: Normal tone, bulk and strength; no pronator drift. COORDINATION: Left finger to nose is normal, right finger to nose is normal, No rest tremor; no intention tremor; no postural tremor; no bradykinesia. SENSATION: Normal to light touch.   ASSESSMENT/PLAN Ivan. [name] is a 53 y.o. male with history of left middle cerebral artery aneurysm coiling, diabetes mellitus, right middle cerebral artery stroke in 07/04/2014, residual right internal carotid artery aneurysm, with residual right middle cerebral artery aneurysm,presenting with dizziness and right upper extremity numbness. He did not receive IV t-PA as his deficits resolved.  Stroke - symptomatic hemorrhagic transformation of previous right MCA infarct. 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 Left: mild soft plaque origin ICA. 40-5Hx L MCA aneurysm, s/p coiling and stent placements 10/28/20150% ICA stenosis.  2D Echo - indication fraction 55-60%. No cardiac source of emboli identified.  HgbA1c 5.9  SCDs for VTE prophylaxis  Diet Carb Modified diet with thin liquids  OOB with assistance  aspirin 81 mg orally every day and clopidogrel 75 mg orally every day prior to admission given recent aneurysm coiling/stent placement, now on aspirin 325 mg orally every day and clopidogrel 75 mg orally every day. Will defer antiplatelets to Dr. Corliss Skainseveshwar - do feel there needs to be a decrease in at least one agent due to size of hemorrhage. (patient previously enrolled in [name], taken off Brilenta for the coiling)  Risk factor education  Ongoing aggressive risk factor management  Resultant - resolution of deficits  Therapy recommendations:  pending  Disposition:  Anticipate d/c home  EEG pending   Hypertension  Home meds:   Altace 2.5 mg daily - continued at time of admission.  Mildly low at times  Hyperlipidemia  Home meds:  Lipitor 20 mg daily - resumed in hospital  LDL 65 goal < 70  Continue statin at discharge  Diabetes  HgbA1c pending goal < 7.0  Controlled  Other Stroke Risk Factors Cigarette smoker, quit smoking 2 months ago ETOH use . Obesity, Body mass index is 29.76 kg/(m^2).  [name] Hx stroke/TIA - right basal ganglia infarct, thrombotic etiology likely proximal RICA occlusion 06/2014 . Family hx stroke (father)  Other Pertinent History  Asthma / COPD  Hospital day # 2  Annie Main, MSN, APRN, ANVP-BC, AGPCNP-BC [facility] Pager: [phone] 08/13/2014 1:32 PM  I have personally examined this patient, reviewed notes, independently viewed imaging studies,  participated in medical decision making and plan of care. I have made any additions or clarifications directly to the above note. Agree with note above. Will await neuroradiologist decision on antiplatelet therapy given small [name] hemorrhage and recent stent needing dual antiplatelets. Discussed with Dr. Vanessa Barbara and patient and answered questions  Delia Heady, MD Medical Director [facility] Pager: [phone] 08/13/2014 4:05 PM   To contact Stroke Continuity provider, please refer to [email or web address]. After hours, contact General Neurology

## 2014-08-13 NOTE — Progress Notes (Signed)
EEG Completed; Results Pending  

## 2014-08-13 NOTE — Progress Notes (Signed)
[facility] PROGRESS NOTE  Ivan BeachRichard [name] ZOX:[id number] DOB: 02/07/1961 DOA: 08/11/2014 PCP: [name]  Assessment/Plan: 1. 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 He recommended continuing antithrombotic therapy with Aspirin and Plavix -Patient had presented with right upper extremity weakness would not coincide with the extension of right MCA territory infarct -Radiology reporting that hemorrhagic transformation did not appear to be acute, however did occur in the interval since September -I spoke with [name] on 08/12/2014 who recommended continuing aspirin 325 mg by mouth daily along with Plavix 75 mg by mouth daily -On 08/13/2014 aspirin dose cut in half to 37.5 mg by mouth daily -Plan to repeat CT scan on Head in AM  2.  Right upper extremity weakness -Resolved -As mentioned above not due to extension of right MCA territory infarct seen on brain imaging studies -Now resolved. -MRI did not reveal acute infarct on the left. Could be secondary to TIA -Continue anti-thrombotic therapy with Aspirin and Plavix as recommended by neurology.  3. History of left MCA territory aneurysm -Status post coiling and stent placements procedure performed on 08/08/2014  4.  Dyslipidemia. 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 He underwent elective coiling with stent placement on 08/08/2014. Patient presenting to the emergency room overnight with complaints of right arm weakness and  dizziness. Initial CT scan of brain showed focal small areas of hemorrhagic transformation on the right middle cerebral artery infarct in the right insula. This was followed up with an MRI of the brain which revealed hemorrhagic transformation of right MCA territory infarct, in part volume increased compared to previous study on 07/04/2014 radiology reporting that this did not appear to be acute and the exact time of extension of hemorrhage cannot be determined on this study.   Objective: Filed Vitals:   08/13/14 1346  BP: 125/70  Pulse: 57  Temp: 98 F (36.7 C)  Resp: 20    Intake/Output Summary (Last 24 hours) at 08/13/14 1658 Last data filed at 08/13/14 1452  Gross per 24 hour  Intake      0 ml  Output   1850 ml  Net  -1850 ml   Filed Weights   08/11/14 [id number]  Weight: 88.769 kg (195 lb 11.2 oz)    Exam:   General:  Patient is in no acute distress, reports resolution to his right upper extremity weakness  Cardiovascular: Regular rate and rhythm normal S1-S2 no extremity edema  Respiratory: Clear to auscultation bilaterally  Abdomen: Soft nontender nondistended  Musculoskeletal: No edema  Neurological: Has 5 of 5 muscle strength to bilateral upper and lower extremities, no alteration to sensation, cranial nerves II though XII grossly intact  Data Reviewed: Basic Metabolic Panel:  Recent Labs Lab 08/09/14 0510 08/11/14 0409  NA 139 138  K 4.0 3.9  CL 103 102  CO2 25 25  GLUCOSE 106* 104*  BUN 9 10  CREATININE 0.82 0.75  CALCIUM 8.8 9.0   Liver Function Tests: No results for input(s): AST, ALT, ALKPHOS, BILITOT, PROT, ALBUMIN in the last 168 hours. No  results for input(s): LIPASE, AMYLASE in the last 168 hours. No results for input(s): AMMONIA in the last 168 hours. CBC:  Recent Labs Lab 08/09/14 0510 08/11/14 0409  WBC 8.2 6.2  NEUTROABS 5.1 2.8  HGB 12.0* 12.0*  HCT 35.4* 35.1*  MCV 87.0 86.7  PLT 176 177   Cardiac Enzymes: No results for  input(s): CKTOTAL, CKMB, CKMBINDEX, TROPONINI in the last 168 hours. BNP (last 3 results) No results for input(s): PROBNP in the last 8760 hours. CBG:  Recent Labs Lab 08/12/14 1649 08/12/14 2149 08/13/14 0730 08/13/14 1140 08/13/14 1618  GLUCAP 129* 84 96 96 147*    No results found for this or any previous visit (from the past 240 hour(s)).   Studies: Ct Head Wo Contrast  08/12/2014   CLINICAL DATA:  Right-sided aneurysm coiling. Dizziness. Hemorrhage.  EXAM: CT HEAD WITHOUT CONTRAST  TECHNIQUE: Contiguous axial images were obtained from the base of the skull through the vertex without intravenous contrast.  COMPARISON:  08/11/2014  FINDINGS: Parenchymal hemorrhage and edema involving the cortex about the right sylvian fissure, in the distribution of the right MCA is stable. There is low-density involving the insular cortex compatible with infarct. These findings have not significantly changed. There is no midline shift. The ventricular system is stable. Aneurysm coils in the left MCA are stable. No subarachnoid hemorrhage. No intraventricular hemorrhage. No new infarct.  IMPRESSION: Stable right hemorrhagic infarct involving the insular and Barre insular cortex in the right MCA distribution. No increasing mass effect. No new infarct. No increasing hemorrhage.   Electronically Signed   By: Ivan BeanArt  Hoss M.D.   On: 08/12/2014 07:24    Scheduled Meds: .  stroke: mapping our early stages of recovery book   Does not apply Once  . [START ON 08/14/2014] aspirin  325 mg Oral Daily  . atorvastatin  20 mg Oral q1800  . [START ON 08/14/2014] clopidogrel  37.5 mg Oral Daily  . docusate sodium  100 mg Oral BID  . metFORMIN  500 mg Oral BID WC  . pantoprazole  40 mg Oral Daily  . polyethylene glycol  17 g Oral Daily  . ramipril  2.5 mg Oral Daily  . tiotropium  18 mcg Inhalation Daily   Continuous Infusions: . sodium chloride 100 mL/hr at 08/13/14 [id number]    Active Problems:   DM type 2 causing  vascular disease   Brain aneurysm   Cerebral hemorrhage   ICH (intracerebral hemorrhage)   Right arm weakness    Time spent: 20 min    [name]  [facility] Pager [phone]. If 7PM-7AM, please contact night-coverage at [email or web address], password [email or web address] 08/13/2014, 4:58 PM  LOS: 2 days

## 2014-08-13 NOTE — Progress Notes (Signed)
   P2y12:  4 today Change Plavix dose from 75 mg daily to 37.5 mg daily per Dr Lise Auereveshwar Called to RN and order in chart  Continue ASA 81 mg daily

## 2014-08-13 NOTE — Progress Notes (Signed)
Referring Physician(s): TRH  Subjective: Pt was discharged 10/29 after L MCA aneurysm stent assisted coiling 10/28 in IR with Dr Corliss Skainseveshwar. Pt had done well overnight. Dc'd to home to return in 2 weeks for follow up Returned 10/31 and admitted through ED TIA; Rt sided extremity numbness lasting 15 minutes x 2 Taking ASA 81 mg and Plavix 75 mg. Pt states he has since admission had one other episode of similar sxs. Denies facial numbness Does have occasional headache behind Rt side of head to back of neck Denies N/V Eating well; sleeping well Strength and sensation is good   Allergies: Tylox  Medications: Prior to Admission medications   Medication Sig Start Date End Date Taking? Authorizing Provider  albuterol (PROVENTIL HFA;VENTOLIN HFA) 108 (90 BASE) MCG/ACT inhaler Inhale 1 puff into the lungs every 6 (six) hours as needed for wheezing or shortness of breath.   Yes Historical Provider, MD  aspirin EC 81 MG tablet Take 81 mg by mouth daily.   Yes Historical Provider, MD  atorvastatin (LIPITOR) 20 MG tablet Take 1 tablet (20 mg total) by mouth daily at 6 PM. 07/06/14  Yes Calvert CantorSaima Rizwan, MD  clopidogrel (PLAVIX) 75 MG tablet Take 75 mg by mouth daily.   Yes Historical Provider, MD  HYDROcodone-acetaminophen (NORCO/VICODIN) 5-325 MG per tablet Take 1 tablet by mouth 2 (two) times daily as needed for moderate pain. 07/08/14  Yes Ricki RodriguezAjay S Kadakia, MD  metFORMIN (GLUCOPHAGE) 500 MG tablet Take 1 tablet (500 mg total) by mouth 2 (two) times daily with a meal. 07/06/14  Yes Calvert CantorSaima Rizwan, MD  nitroGLYCERIN (NITROSTAT) 0.4 MG SL tablet Place 1 tablet (0.4 mg total) under the tongue every 5 (five) minutes x 3 doses as needed for chest pain. 07/08/14  Yes Ricki RodriguezAjay S Kadakia, MD  ramipril (ALTACE) 2.5 MG capsule Take 2.5 mg by mouth daily.   Yes Historical Provider, MD  tiotropium (SPIRIVA HANDIHALER) 18 MCG inhalation capsule Place 1 capsule (18 mcg total) into inhaler and inhale every morning.  07/06/14  Yes Calvert CantorSaima Rizwan, MD    Review of Systems  Vital Signs: BP 125/75 mmHg  Pulse 66  Temp(Src) 97.6 F (36.4 C) (Oral)  Resp 20  Ht 5\' 8"  (1.727 m)  Wt 88.769 kg (195 lb 11.2 oz)  BMI 29.76 kg/m2  SpO2 96%  Physical Exam  Neurological:  PE: afeb; vss Up in bed A/O Appropriate Face symmetrical; tongue midline Speech clear Good strength and sensation B CT Head 10/31: focal small areas of hemorrhagic transformation of R MCA infarct CT head 11/1: stable P2y12: 6 11/1 Dr Corliss Skainseveshwar has seen and examined pt    Imaging: Ct Head Wo Contrast  08/12/2014   CLINICAL DATA:  Right-sided aneurysm coiling. Dizziness. Hemorrhage.  EXAM: CT HEAD WITHOUT CONTRAST  TECHNIQUE: Contiguous axial images were obtained from the base of the skull through the vertex without intravenous contrast.  COMPARISON:  08/11/2014  FINDINGS: Parenchymal hemorrhage and edema involving the cortex about the right sylvian fissure, in the distribution of the right MCA is stable. There is low-density involving the insular cortex compatible with infarct. These findings have not significantly changed. There is no midline shift. The ventricular system is stable. Aneurysm coils in the left MCA are stable. No subarachnoid hemorrhage. No intraventricular hemorrhage. No new infarct.  IMPRESSION: Stable right hemorrhagic infarct involving the insular and Barre insular cortex in the right MCA distribution. No increasing mass effect. No new infarct. No increasing hemorrhage.  Electronically Signed   By: Maryclare BeanArt  Hoss M.D.   On: 08/12/2014 07:24   Ct Head Wo Contrast  08/11/2014   CLINICAL DATA:  Severe headache today with but tingling in the right hand with nausea and dizziness. Recent right middle cerebral artery infarct. 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 Known right middle cerebral artery aneurysm.  EXAM: CT HEAD WITHOUT CONTRAST  TECHNIQUE: Contiguous axial images were obtained from the base of the  skull through the vertex without intravenous contrast.  COMPARISON:  CT scan dated 07/07/2014 and MRI dated 07/04/2014  FINDINGS: The patient has developed focal hemorrhagic transformation of the right middle cerebral artery infarct since the prior CT scan. Edema at the site of the infarct has markedly diminished.  The mass effect upon the right lateral ventricle present on the prior exam has resolved. There is no midline shift. Coils and stents are seen in the left middle cerebral artery. No new infarction. No osseous abnormality.  IMPRESSION: 1. Focal small areas of hemorrhagic transformation of the right middle cerebral artery infarct in the right insula. No subarachnoid hemorrhage. 2. Marked improvement in edema at the site of the prior infarct with resolution of mass effect upon the right lateral ventricle.   Electronically Signed   By: Geanie CooleyJim  Maxwell M.D.   On: 08/11/2014 04:36   [name]  08/11/2014   CLINICAL DATA:  Right MCA infarct 07/03/2014.  Elective coiling of left MCA aneurysm 08/08/2014.  Acute onset right arm weakness and dizziness 08/11/2014.  EXAM: MRI HEAD WITHOUT CONTRAST  TECHNIQUE: Multiplanar, multiecho pulse sequences of the brain and surrounding structures were obtained without intravenous contrast.  COMPARISON:  CT 08/11/2014.  MRI 07/04/2014  FINDINGS: Right MCA infarct shows extension and interval hemorrhage since the MRI of 07/04/2014. Infarct involves the putamen, and caudate. Interval hemorrhage is present in the right basal ganglia. There is also infarction involving the insula and frontal operculum with interval hemorrhage. Overall area of infarction has increased in size since the prior MRI. It is not possible to accurately determine when the infarct extension occurred. There is a small area of residual restricted diffusion in the right parietal white matter unchanged from the prior study.  No acute infarct in the left hemisphere. No acute infarct in the brainstem or  cerebellum.  Ventricle size is normal.  Negative for mass lesion.  Interval coiling of left MCA aneurysm.  Decreased caliber the right internal carotid artery due to slow flow.  IMPRESSION: Hemorrhagic transformation of right MCA infarct. The infarct volume has increased in size since 07/04/2014 consistent with extension however this does not appear to be acute. The exact time of the infarct extension hemorrhage cannot be determined on this study but occurred in the interval.  No acute infarct on the left.  Interval coiling of left MCA aneurysm.   Electronically Signed   By: Marlan Palauharles  Clark M.D.   On: 08/11/2014 10:10   Dg Chest Port 1 View  08/11/2014   CLINICAL DATA:  Nonproductive cough.  EXAM: PORTABLE CHEST - 1 VIEW  COMPARISON:  07/07/2014  FINDINGS: The heart size and mediastinal contours are within normal limits. Both lungs are clear. The visualized skeletal structures are unremarkable.  IMPRESSION: No significant abnormalities.   Electronically Signed   By: Geanie CooleyJim  Maxwell M.D.   On: 08/11/2014 06:26    Labs:  CBC:  Recent Labs  07/08/14 0800 08/02/14 1511 08/09/14 0510 08/11/14 0409  WBC 6.3 7.3 8.2 6.2  HGB 13.5  13.3 12.0* 12.0*  HCT 39.2 38.5* 35.4* 35.1*  PLT 192 197 176 177    COAGS:  Recent Labs  07/03/14 2139 07/08/14 0125 08/02/14 1511  INR 0.89 1.04 1.05  APTT 30  --  29    BMP:  Recent Labs  07/08/14 0125 08/02/14 1511 08/09/14 0510 08/11/14 0409  NA 138 141 139 138  K 4.1 3.9 4.0 3.9  CL 99 103 103 102  CO2 24 28 25 25   GLUCOSE 110* 111* 106* 104*  BUN 16 12 9 10   CALCIUM 9.1 9.2 8.8 9.0  CREATININE 0.86 0.98 0.82 0.75  GFRNONAA >90 >90 >90 >90  GFRAA >90 >90 >90 >90    LIVER FUNCTION TESTS:  Recent Labs  05/13/14 1855 07/03/14 2139 07/07/14 2053 08/02/14 1511  BILITOT 0.5 <0.2* 0.4 <0.2*  AST 19 16 17 15   ALT 19 18 16 18   ALKPHOS 65 60 65 61  PROT 7.2 6.5 7.4 6.9  ALBUMIN 4.1 3.3* 3.6 3.6    Assessment and Plan:  L Middle  cerebral artery aneurysm stent/coil 08/08/14 Discharged to home 10/29 in good condition Rt sided numbness and re admitted 10/31 Symptoms improving Continue ASA/Plavix Will recheck P2y12 now Repeat CT Head 11/3 Will follow    I spent a total of 15 minutes face to face in clinical consultation/evaluation, greater than 50% of which was counseling/coordinating care for TIA/ R MCA aneurysm  Signed: TURPIN,PAMELA A 08/13/2014, 11:15 AM

## 2014-08-13 NOTE — Progress Notes (Signed)
Nutrition Brief Note  Patient identified on the Malnutrition Screening Tool (MST) Report. Malnutrition Screening Tool result is inaccurate.   Pt reports that his appetite is good and he is eating well. He reports meeting with an outpatient dietitian. He has changed his diet to low carb and has been losing weight. Per bed scale today, pt weighs 220 lbs.   Wt Readings from Last 15 Encounters:  08/11/14 195 lb 11.2 oz (88.769 kg)  08/08/14 244 lb 14.9 oz (111.1 kg)  08/02/14 244 lb (110.678 kg)  07/18/14 241 lb 6.4 oz (109.498 kg)  07/08/14 240 lb (108.863 kg)  07/04/14 245 lb 11.2 oz (111.449 kg)  05/13/14 248 lb (112.492 kg)  10/29/12 245 lb (111.131 kg)  10/19/12 245 lb 12.8 oz (111.494 kg)    Body mass index is 29.76 kg/(m^2). Patient meets criteria for Overweight based on current BMI.   Current diet order is Carb Modified, patient is consuming approximately 100% of meals at this time. Labs and medications reviewed.   No nutrition interventions warranted at this time. If nutrition issues arise, please consult RD.   Ian Malkineanne Barnett RD, LDN Inpatient Clinical Dietitian Pager: [phone] After Hours Pager: [phone]

## 2014-08-14 ENCOUNTER — Inpatient Hospital Stay (HOSPITAL_COMMUNITY): Payer: Medicaid Other

## 2014-08-14 ENCOUNTER — Other Ambulatory Visit: Payer: Self-pay | Admitting: Radiology

## 2014-08-14 DIAGNOSIS — G459 Transient cerebral ischemic attack, unspecified: Secondary | ICD-10-CM | POA: Diagnosis present

## 2014-08-14 DIAGNOSIS — I639 Cerebral infarction, unspecified: Secondary | ICD-10-CM

## 2014-08-14 DIAGNOSIS — G458 Other transient cerebral ischemic attacks and related syndromes: Secondary | ICD-10-CM

## 2014-08-14 LAB — GLUCOSE, CAPILLARY
GLUCOSE-CAPILLARY: 104 mg/dL — AB (ref 70–99)
GLUCOSE-CAPILLARY: 108 mg/dL — AB (ref 70–99)

## 2014-08-14 MED ORDER — CLOPIDOGREL BISULFATE 75 MG PO TABS: 37.5000 mg | ORAL_TABLET | Freq: Every day | ORAL | Status: DC

## 2014-08-14 MED ORDER — TIOTROPIUM BROMIDE MONOHYDRATE 18 MCG IN CAPS: 18.0000 ug | ORAL_CAPSULE | Freq: Every morning | RESPIRATORY_TRACT | Status: DC

## 2014-08-14 MED ORDER — ASPIRIN 325 MG PO TABS: 325.0000 mg | ORAL_TABLET | Freq: Every day | ORAL | Status: DC

## 2014-08-14 MED ORDER — DSS 100 MG PO CAPS: 100.0000 mg | ORAL_CAPSULE | Freq: Two times a day (BID) | ORAL | Status: DC

## 2014-08-14 NOTE — Progress Notes (Signed)
Referring Physician(s): TRH  Subjective:  L MCA aneurysm coil/stent 10/28 in IR DC'd next day Re admitted 10/31 with Rt sided numbness/weakness sxs resolving Dr Corliss Skainseveshwar has seen and examined pt Pt without complaints today For dc to home now Will plan to check P2y12 Fri 11/6 Plan for follow up with Dr Corliss Skainseveshwar 11/11 as OP   Allergies: Tylox  Medications: Prior to Admission medications   Medication Sig Start Date End Date Taking? Authorizing Provider  albuterol (PROVENTIL HFA;VENTOLIN HFA) 108 (90 BASE) MCG/ACT inhaler Inhale 1 puff into the lungs every 6 (six) hours as needed for wheezing or shortness of breath.   Yes Historical Provider, MD  aspirin EC 81 MG tablet Take 81 mg by mouth daily.   Yes Historical Provider, MD  atorvastatin (LIPITOR) 20 MG tablet Take 1 tablet (20 mg total) by mouth daily at 6 PM. 07/06/14  Yes Calvert CantorSaima Rizwan, MD  clopidogrel (PLAVIX) 75 MG tablet Take 75 mg by mouth daily.   Yes Historical Provider, MD  HYDROcodone-acetaminophen (NORCO/VICODIN) 5-325 MG per tablet Take 1 tablet by mouth 2 (two) times daily as needed for moderate pain. 07/08/14  Yes Ricki RodriguezAjay S Kadakia, MD  metFORMIN (GLUCOPHAGE) 500 MG tablet Take 1 tablet (500 mg total) by mouth 2 (two) times daily with a meal. 07/06/14  Yes Calvert CantorSaima Rizwan, MD  nitroGLYCERIN (NITROSTAT) 0.4 MG SL tablet Place 1 tablet (0.4 mg total) under the tongue every 5 (five) minutes x 3 doses as needed for chest pain. 07/08/14  Yes Ricki RodriguezAjay S Kadakia, MD  ramipril (ALTACE) 2.5 MG capsule Take 2.5 mg by mouth daily.   Yes Historical Provider, MD  aspirin 325 MG tablet Take 1 tablet (325 mg total) by mouth daily. 08/14/14   Jeralyn BennettEzequiel Zamora, MD  clopidogrel (PLAVIX) 75 MG tablet Take 0.5 tablets (37.5 mg total) by mouth daily. 08/14/14   Jeralyn BennettEzequiel Zamora, MD  docusate sodium 100 MG CAPS Take 100 mg by mouth 2 (two) times daily. 08/14/14   Jeralyn BennettEzequiel Zamora, MD  tiotropium (SPIRIVA HANDIHALER) 18 MCG inhalation capsule Place 1 capsule  (18 mcg total) into inhaler and inhale every morning. 08/14/14   Jeralyn BennettEzequiel Zamora, MD    Review of Systems  Vital Signs: BP 131/65 mmHg  Pulse 58  Temp(Src) 97.9 F (36.6 C) (Oral)  Resp 198  Ht 5\' 8"  (1.727 m)  Wt 88.769 kg (195 lb 11.2 oz)  BMI 29.76 kg/m2  SpO2 96%  Physical Exam  Musculoskeletal:  A/O Moves all 4s Good strength Good sensation Face symmetrical Appropriate CT head 11/3: stable  Afeb; vss    Imaging: Ct Head Wo Contrast  08/14/2014   CLINICAL DATA:  53 year old male - followup right MCA infarct and left MCA aneurysm coiling  EXAM: CT HEAD WITHOUT CONTRAST  TECHNIQUE: Contiguous axial images were obtained from the base of the skull through the vertex without intravenous contrast.  COMPARISON:  08/12/2014 and prior head CTs.  08/11/2014 MR.  FINDINGS: Right MCA infarct changes with insular/basal ganglia hemorrhage is unchanged from 08/12/2014. Coils in the region of the left MCA are again noted.  There is no evidence of new infarct or hemorrhage.  There is no evidence of midline shift or hydrocephalus.  The bony calvarium is unremarkable.  IMPRESSION: Unchanged appearance of right MCA infarct with hemorrhage. No interval changes from 08/12/2014.   Electronically Signed   By: Laveda AbbeJeff  Hu M.D.   On: 08/14/2014 09:41   Ct Head Wo Contrast  08/12/2014   CLINICAL DATA:  Right-sided  aneurysm coiling. Dizziness. Hemorrhage.  EXAM: CT HEAD WITHOUT CONTRAST  TECHNIQUE: Contiguous axial images were obtained from the base of the skull through the vertex without intravenous contrast.  COMPARISON:  08/11/2014  FINDINGS: Parenchymal hemorrhage and edema involving the cortex about the right sylvian fissure, in the distribution of the right MCA is stable. There is low-density involving the insular cortex compatible with infarct. These findings have not significantly changed. There is no midline shift. The ventricular system is stable. Aneurysm coils in the left MCA are stable. No  subarachnoid hemorrhage. No intraventricular hemorrhage. No new infarct.  IMPRESSION: Stable right hemorrhagic infarct involving the insular and Barre insular cortex in the right MCA distribution. No increasing mass effect. No new infarct. No increasing hemorrhage.   Electronically Signed   By: Maryclare Bean M.D.   On: 08/12/2014 07:24   Ct Head Wo Contrast  08/11/2014   CLINICAL DATA:  Severe headache today with but tingling in the right hand with nausea and dizziness. Recent right middle cerebral artery infarct. 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 Known right middle cerebral artery aneurysm.  EXAM: CT HEAD WITHOUT CONTRAST  TECHNIQUE: Contiguous axial images were obtained from the base of the skull through the vertex without intravenous contrast.  COMPARISON:  CT scan dated 07/07/2014 and MRI dated 07/04/2014  FINDINGS: The patient has developed focal hemorrhagic transformation of the right middle cerebral artery infarct since the prior CT scan. Edema at the site of the infarct has markedly diminished.  The mass effect upon the right lateral ventricle present on the prior exam has resolved. There is no midline shift. Coils and stents are seen in the left middle cerebral artery. No new infarction. No osseous abnormality.  IMPRESSION: 1. Focal small areas of hemorrhagic transformation of the right middle cerebral artery infarct in the right insula. No subarachnoid hemorrhage. 2. Marked improvement in edema at the site of the prior infarct with resolution of mass effect upon the right lateral ventricle.   Electronically Signed   By: Geanie Cooley M.D.   On: 08/11/2014 04:36   [name]  08/11/2014   CLINICAL DATA:  Right MCA infarct 07/03/2014.  Elective coiling of left MCA aneurysm 08/08/2014.  Acute onset right arm weakness and dizziness 08/11/2014.  EXAM: MRI HEAD WITHOUT CONTRAST  TECHNIQUE: Multiplanar, multiecho pulse sequences of the brain and surrounding structures were  obtained without intravenous contrast.  COMPARISON:  CT 08/11/2014.  MRI 07/04/2014  FINDINGS: Right MCA infarct shows extension and interval hemorrhage since the MRI of 07/04/2014. Infarct involves the putamen, and caudate. Interval hemorrhage is present in the right basal ganglia. There is also infarction involving the insula and frontal operculum with interval hemorrhage. Overall area of infarction has increased in size since the prior MRI. It is not possible to accurately determine when the infarct extension occurred. There is a small area of residual restricted diffusion in the right parietal white matter unchanged from the prior study.  No acute infarct in the left hemisphere. No acute infarct in the brainstem or cerebellum.  Ventricle size is normal.  Negative for mass lesion.  Interval coiling of left MCA aneurysm.  Decreased caliber the right internal carotid artery due to slow flow.  IMPRESSION: Hemorrhagic transformation of right MCA infarct. The infarct volume has increased in size since 07/04/2014 consistent with extension however this does not appear to be acute. The exact time of the infarct extension hemorrhage cannot be determined on this study but occurred in  the interval.  No acute infarct on the left.  Interval coiling of left MCA aneurysm.   Electronically Signed   By: Marlan Palauharles  Clark M.D.   On: 08/11/2014 10:10   Dg Chest Port 1 View  08/11/2014   CLINICAL DATA:  Nonproductive cough.  EXAM: PORTABLE CHEST - 1 VIEW  COMPARISON:  07/07/2014  FINDINGS: The heart size and mediastinal contours are within normal limits. Both lungs are clear. The visualized skeletal structures are unremarkable.  IMPRESSION: No significant abnormalities.   Electronically Signed   By: Geanie CooleyJim  Maxwell M.D.   On: 08/11/2014 06:26    Labs:  CBC:  Recent Labs  07/08/14 0800 08/02/14 1511 08/09/14 0510 08/11/14 0409  WBC 6.3 7.3 8.2 6.2  HGB 13.5 13.3 12.0* 12.0*  HCT 39.2 38.5* 35.4* 35.1*  PLT 192 197 176  177    COAGS:  Recent Labs  07/03/14 2139 07/08/14 0125 08/02/14 1511  INR 0.89 1.04 1.05  APTT 30  --  29    BMP:  Recent Labs  07/08/14 0125 08/02/14 1511 08/09/14 0510 08/11/14 0409  NA 138 141 139 138  K 4.1 3.9 4.0 3.9  CL 99 103 103 102  CO2 24 28 25 25   GLUCOSE 110* 111* 106* 104*  BUN 16 12 9 10   CALCIUM 9.1 9.2 8.8 9.0  CREATININE 0.86 0.98 0.82 0.75  GFRNONAA >90 >90 >90 >90  GFRAA >90 >90 >90 >90    LIVER FUNCTION TESTS:  Recent Labs  05/13/14 1855 07/03/14 2139 07/07/14 2053 08/02/14 1511  BILITOT 0.5 <0.2* 0.4 <0.2*  AST 19 16 17 15   ALT 19 18 16 18   ALKPHOS 65 60 65 61  PROT 7.2 6.5 7.4 6.9  ALBUMIN 4.1 3.3* 3.6 3.6    Assessment and Plan:  L MCA aneurysm coiling/stent 10/28 Discharged 10/29 Readmitted 10/31 with Rt sided numbness- resolving Plan to follow with Dr Corliss Skainseveshwar: Nov 11 Recheck P2y12 11/6 Continue ASA 325 mg; Plavix 37.5 mg daily Pt has been seen and examined by Dr Corliss Skainseveshwar Pt has good understanding of this plan    I spent a total of 15 minutes face to face in clinical consultation/evaluation, greater than 50% of which was counseling/coordinating care for status post L MCA aneurysm coil/stent 10/28.  Signed: TURPIN,PAMELA A 08/14/2014, 2:19 PM

## 2014-08-14 NOTE — Progress Notes (Signed)
1415 discharge instructions and prescriptions given to pt. Pt verbally acknowledged understanding. Pt voiced no questions when prompted. Pt to be transported by private car to home by family member. Family member has been notified per pt. Will monitor.    Andrew AuVafiadis, Tangelia Sanson I 08/14/2014 2:24 PM

## 2014-08-14 NOTE — Procedures (Signed)
ELECTROENCEPHALOGRAM REPORT  Patient: [name]       Room #: 1O104N24 EEG No. ID: [id number]  Age: 53 y.o.        Sex: male Referring Physician: Vanessa BarbaraZAMORA, E Report Date:  08/14/2014        Interpreting Physician: Aline BrochureSTEWART,Miamarie Moll R  History: Ivan BeachRichard Timmins is an 53 y.o. male who is experienced recurrent episodes of dizziness with numbness and weakness of right upper extremity. Patient also has had acute right MCA territory stroke.  Indications for study:  Rule out new-onset focal seizure disorder.  Technique: This is an 18 channel routine scalp EEG performed at the bedside with bipolar and monopolar montages arranged in accordance to the international 10/20 system of electrode placement.   Description: this EEG recording was performed during wakefulness and during sleep. Predominant background activity during wakefulness consisted of 9-10 Hz symmetrical alpha rhythm. During sleep though was slowing of background activity with mixed delta and theta activity as well as symmetrical sleep spindles, arousal responses and vertex waves. Photic stimulation and hyperventilation were not performed. No epileptiform discharges were recorded.  Interpretation: this is a normal EEG recording during wakefulness and during sleep with no evidence of seizure disorder demonstrated.   Venetia MaxonR Jenna Ardoin M.D. [facility] [phone]

## 2014-08-14 NOTE — Discharge Summary (Addendum)
Physician Discharge Summary  Hazael Olveda RWE:[id number] DOB: [birth year] DOA: 08/11/2014  PCP: Phineas Inches, MD  Admit date: 08/11/2014 Discharge date: 08/14/2014  Time spent: 35 minutes   Recommendations for Outpatient Follow-up:  1. Please follow-up on blood pressures 2. 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 Neurology recommending discharging him on Plavix 37.5 mg by mouth daily and aspirin 325 mg by mouth daily  Discharge Diagnoses:  Principal Problem:   TIA (transient ischemic attack) Active Problems:   DM type 2 causing vascular disease   Brain aneurysm   Cerebral hemorrhage   ICH (intracerebral hemorrhage)   Right arm weakness   Discharge Condition: Stable/improved  Diet recommendation: Heart healthy diet  Filed Weights   08/11/14 0652  Weight: 88.769 kg (195 lb 11.2 oz)    History of present illness:  Daivon Rayos is a 53 y.o. male who suffered a R MCA stroke back on 07/04/14. 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 After consultation with Dr. Estanislado Pandy on 10/2 it was decided to electively coil with stent assistance the L MCA aneurysm. This was performed on 10/28 (they also found a R MCA aneurysm during the procedure). Patient was discharged with ASA and Plavix.  He returns to the ED today with R arm weakness, dizziness. Symptoms onset last night while watching the world series, resolved spontaneously after 10 mins. They reoccurred again and resolved spontaneously again, and so patient decided to come in to the ED.  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 He underwent elective coiling with stent placement  on 08/08/2014. Patient presenting to the emergency room overnight with complaints of right arm weakness and dizziness. Initial CT scan of brain showed focal small areas of hemorrhagic transformation on the right middle cerebral artery infarct in the right insula. 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 Right arm weakness did not appear to correlate with right-sided hemorrhagic transformation seen on MRI. There is no evidence of acute CVA on left side of brain per MRI report. Suspect his right upper extremity weakness could have been related to TIA.Case was discussed with Dr. Estanislado Pandy who initially recommended increasing aspirin to 325 mg by mouth daily along with Plavix 75 mg by mouth daily. Repeat head CT on 08/14/2014 showed unchanged appearance of right MCA territory infarct with hemorrhage, no interval changes from previous study on 08/12/2014. Neurology recommended discharging him on 37.5 mg of Plavix with aspirin 325 mg by mouth daily. Patient was discharged in stable condition on 08/14/2014 to his home.   Consultations:  Neuro interventional radiology  Neurology  Discharge Exam: Filed Vitals:   08/14/14 1018  BP: 131/65  Pulse: 58  Temp: 97.9 F (36.6 C)  Resp: 198     General: Patient is in no acute distress, reports resolution to his right upper extremity weakness  Cardiovascular: Regular rate and rhythm normal S1-S2 no extremity edema  Respiratory: Clear  to auscultation bilaterally  Abdomen: Soft nontender nondistended  Musculoskeletal: No edema  Neurological: Has 5 of 5 muscle strength to bilateral upper and lower extremities, no alteration to sensation, cranial nerves II though XII grossly  intact  Discharge Instructions You were cared for by a hospitalist during your hospital stay. If you have any questions about your discharge medications or the care you received while you were in the hospital after you are discharged, you can call the unit and asked to speak with the hospitalist on call if the hospitalist that took care of you is not available. Once you are discharged, your primary care physician will handle any further medical issues. Please note that NO REFILLS for any discharge medications will be authorized once you are discharged, as it is imperative that you return to your primary care physician (or establish a relationship with a primary care physician if you do not have one) for your aftercare needs so that they can reassess your need for medications and monitor your lab values.  Discharge Instructions    Ambulatory referral to Neurology    Complete by:  As directed   Stroke patient. Dr. Leonie Man prefers follow up in 1 month     Call MD for:  difficulty breathing, headache or visual disturbances    Complete by:  As directed      Call MD for:  difficulty breathing, headache or visual disturbances    Complete by:  As directed      Call MD for:  extreme fatigue    Complete by:  As directed      Call MD for:  extreme fatigue    Complete by:  As directed      Call MD for:  hives    Complete by:  As directed      Call MD for:  hives    Complete by:  As directed      Call MD for:  persistant dizziness or light-headedness    Complete by:  As directed      Call MD for:  persistant dizziness or light-headedness    Complete by:  As directed      Call MD for:  persistant nausea and vomiting    Complete by:  As directed      Call MD for:  persistant nausea and vomiting    Complete by:  As directed      Call MD for:  redness, tenderness, or signs of infection (pain, swelling, redness, odor or green/yellow discharge around incision site)    Complete by:  As directed      Call MD  for:  redness, tenderness, or signs of infection (pain, swelling, redness, odor or green/yellow discharge around incision site)    Complete by:  As directed      Call MD for:  severe uncontrolled pain    Complete by:  As directed      Call MD for:  severe uncontrolled pain    Complete by:  As directed      Call MD for:  temperature >100.4    Complete by:  As directed      Call MD for:  temperature >100.4    Complete by:  As directed      Diet - low sodium heart healthy    Complete by:  As directed      Diet - low sodium heart healthy    Complete by:  As directed      Discharge instructions  Complete by:  As directed   Please follow up with Dr Estanislado Pandy in week, keep your previously scheduled appointment.     Increase activity slowly    Complete by:  As directed      Increase activity slowly    Complete by:  As directed           Current Discharge Medication List    START taking these medications   Details  aspirin 325 MG tablet Take 1 tablet (325 mg total) by mouth daily. Qty: 30 tablet, Refills: 0    docusate sodium 100 MG CAPS Take 100 mg by mouth 2 (two) times daily. Qty: 10 capsule, Refills: 0      CONTINUE these medications which have CHANGED   Details  clopidogrel (PLAVIX) 75 MG tablet Take 0.5 tablets (37.5 mg total) by mouth daily. Qty: 30 tablet, Refills: 1    tiotropium (SPIRIVA HANDIHALER) 18 MCG inhalation capsule Place 1 capsule (18 mcg total) into inhaler and inhale every morning. Qty: 30 capsule, Refills: 1      CONTINUE these medications which have NOT CHANGED   Details  albuterol (PROVENTIL HFA;VENTOLIN HFA) 108 (90 BASE) MCG/ACT inhaler Inhale 1 puff into the lungs every 6 (six) hours as needed for wheezing or shortness of breath.    atorvastatin (LIPITOR) 20 MG tablet Take 1 tablet (20 mg total) by mouth daily at 6 PM. Qty: 30 tablet, Refills: 0    HYDROcodone-acetaminophen (NORCO/VICODIN) 5-325 MG per tablet Take 1 tablet by mouth 2 (two) times  daily as needed for moderate pain. Qty: 60 tablet, Refills: 0    metFORMIN (GLUCOPHAGE) 500 MG tablet Take 1 tablet (500 mg total) by mouth 2 (two) times daily with a meal. Qty: 60 tablet, Refills: 0    nitroGLYCERIN (NITROSTAT) 0.4 MG SL tablet Place 1 tablet (0.4 mg total) under the tongue every 5 (five) minutes x 3 doses as needed for chest pain. Qty: 25 tablet, Refills: 1    ramipril (ALTACE) 2.5 MG capsule Take 2.5 mg by mouth daily.      STOP taking these medications     aspirin EC 81 MG tablet        Allergies  Allergen Reactions  . Tylox [Oxycodone-Acetaminophen] Hives and Other (See Comments)    Reaction: Tremors and diaphoresis    Follow-up Information    Follow up with SETHI,PRAMOD, MD In 1 month.   Specialties:  Neurology, Radiology   Why:  Stroke Clinic, Office will call you with appointment date & time   Contact information:   [address] [place] [address] [phone]       Follow up with Phineas Inches, MD In 2 days.   Specialty:  Family Medicine   Contact information:   [address] [place] [address] [phone]        The results of significant diagnostics from this hospitalization (including imaging, microbiology, ancillary and laboratory) are listed below for reference.    Significant Diagnostic Studies: Ct Head Wo Contrast  08/14/2014   CLINICAL DATA:  53 year old male - followup right MCA infarct and left MCA aneurysm coiling  EXAM: CT HEAD WITHOUT CONTRAST  TECHNIQUE: Contiguous axial images were obtained from the base of the skull through the vertex without intravenous contrast.  COMPARISON:  08/12/2014 and prior head CTs.  08/11/2014 MR.  FINDINGS: Right MCA infarct changes with insular/basal ganglia hemorrhage is unchanged from 08/12/2014. Coils in the region of the left MCA are again noted.  There is no evidence of new infarct or hemorrhage.  There is no evidence of midline shift or hydrocephalus.  The bony  calvarium is unremarkable.  IMPRESSION: Unchanged appearance of right MCA infarct with hemorrhage. No interval changes from 08/12/2014.   Electronically Signed   By: Hassan Rowan M.D.   On: 08/14/2014 09:41   Ct Head Wo Contrast  08/12/2014   CLINICAL DATA:  Right-sided aneurysm coiling. Dizziness. Hemorrhage.  EXAM: CT HEAD WITHOUT CONTRAST  TECHNIQUE: Contiguous axial images were obtained from the base of the skull through the vertex without intravenous contrast.  COMPARISON:  08/11/2014  FINDINGS: Parenchymal hemorrhage and edema involving the cortex about the right sylvian fissure, in the distribution of the right MCA is stable. There is low-density involving the insular cortex compatible with infarct. These findings have not significantly changed. There is no midline shift. The ventricular system is stable. Aneurysm coils in the left MCA are stable. No subarachnoid hemorrhage. No intraventricular hemorrhage. No new infarct.  IMPRESSION: Stable right hemorrhagic infarct involving the insular and Barre insular cortex in the right MCA distribution. No increasing mass effect. No new infarct. No increasing hemorrhage.   Electronically Signed   By: Maryclare Bean M.D.   On: 08/12/2014 07:24   Ct Head Wo Contrast  08/11/2014   CLINICAL DATA:  Severe headache today with but tingling in the right hand with nausea and dizziness. Recent right middle cerebral artery infarct. 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 Known right middle cerebral artery aneurysm.  EXAM: CT HEAD WITHOUT CONTRAST  TECHNIQUE: Contiguous axial images were obtained from the base of the skull through the vertex without intravenous contrast.  COMPARISON:  CT scan dated 07/07/2014 and MRI dated 07/04/2014  FINDINGS: The patient has developed focal hemorrhagic transformation of the right middle cerebral artery infarct since the prior CT scan. Edema at the site of the infarct has markedly diminished.  The mass effect upon the right  lateral ventricle present on the prior exam has resolved. There is no midline shift. Coils and stents are seen in the left middle cerebral artery. No new infarction. No osseous abnormality.  IMPRESSION: 1. Focal small areas of hemorrhagic transformation of the right middle cerebral artery infarct in the right insula. No subarachnoid hemorrhage. 2. Marked improvement in edema at the site of the prior infarct with resolution of mass effect upon the right lateral ventricle.   Electronically Signed   By: Rozetta Nunnery M.D.   On: 08/11/2014 04:36   [name]  08/11/2014   CLINICAL DATA:  Right MCA infarct 07/03/2014.  Elective coiling of left MCA aneurysm 08/08/2014.  Acute onset right arm weakness and dizziness 08/11/2014.  EXAM: MRI HEAD WITHOUT CONTRAST  TECHNIQUE: Multiplanar, multiecho pulse sequences of the brain and surrounding structures were obtained without intravenous contrast.  COMPARISON:  CT 08/11/2014.  MRI 07/04/2014  FINDINGS: Right MCA infarct shows extension and interval hemorrhage since the MRI of 07/04/2014. Infarct involves the putamen, and caudate. Interval hemorrhage is present in the right basal ganglia. There is also infarction involving the insula and frontal operculum with interval hemorrhage. Overall area of infarction has increased in size since the prior MRI. It is not possible to accurately determine when the infarct extension occurred. There is a small area of residual restricted diffusion in the right parietal white matter unchanged from the prior study.  No acute infarct in the left hemisphere. No acute infarct in the brainstem or cerebellum.  Ventricle size is  normal.  Negative for mass lesion.  Interval coiling of left MCA aneurysm.  Decreased caliber the right internal carotid artery due to slow flow.  IMPRESSION: Hemorrhagic transformation of right MCA infarct. The infarct volume has increased in size since 07/04/2014 consistent with extension however this does not  appear to be acute. The exact time of the infarct extension hemorrhage cannot be determined on this study but occurred in the interval.  No acute infarct on the left.  Interval coiling of left MCA aneurysm.   Electronically Signed   By: Franchot Gallo M.D.   On: 08/11/2014 10:10   Ir Transcath/emboliz  08/13/2014   CLINICAL DATA:  Unusual left-sided headaches. 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 Strong family history of ruptured and unruptured intracranial aneurysms.  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  MEDICATIONS: As per general anesthesia.  CONTRAST:  191m OMNIPAQUE IOHEXOL 300 MG/ML  SOLN  PROCEDURE: Following a full explanation of the procedure along with the potential associated complications, an informed witnessed consent was obtained. The indications, the risks, the benefits and the alternatives had been discussed fully with the patient and the patient's family prior to the procedure.  The patient was put under general anesthesia [facility] The right groin was prepped and draped in the usual sterile fashion. Thereafter using modified Seldinger technique, transfemoral access into the right common femoral artery was obtained without difficulty. Over a 0.035 inch guidewire, a 5 French Pinnacle sheath was inserted. Through this, and also over a 0.035 inch guidewire, a 5 French JB1 catheter was advanced to the aortic arch region and selectively positioned in the innominate artery, the right common carotid artery and left common carotid artery. The patient  tolerated the procedure well.  COMPLICATIONS: None immediate.  FINDINGS: The right common carotid arteriogram demonstrates the right external carotid artery to be mildly narrowed at the level of the origin of the right facial artery. The remaining branches are seen to opacify normally.  The right internal carotid artery at the bulb demonstrates focal area of approximately 95% plus stenoses associated with a filling defect with improved caliber and antegrade flow into now a more normal appearing internal carotid artery to the cranial skull base. The petrous, the cavernous and the supraclinoid segments are widely patent. Opacification of the right middle cerebral artery is seen with mixing of non-opacified blood from the contralateral internal carotid artery via the anterior communicating artery. At the right MCA bifurcation there is a suggestion of a saccular aneurysm projecting superiorly. Non visualization of the right anterior cerebral artery is seen because of the non-opacified blood.  The right vertebral artery origin is normal.  The vessel is seen to opacify normally to the cranial skull base.  Normal opacification is seen of the right vertebrobasilar junction and the right posterior inferior cerebellar artery. The visualized basilar artery, the superior cerebellar arteries and the anterior inferior cerebellar arteries are grossly normal into the delayed arterial phase.  The left common carotid arteriogram demonstrates the left external carotid artery and its major branches to be normal.  The left internal carotid artery at the bulb to the cranial skull base is seen to opacify normally.  The petrous,  cavernous and supraclinoid segments are widely patent.  The left middle and the left anterior cerebral artery opacify normally into the capillary and venous phases.  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  The proximal  left middle cerebral artery trunk measures approximately 2.52 mm with the inferior division of the left middle cerebral artery measuring approximately 1.9 mm.  Opacification is also seen of the left anterior cerebral artery into the capillary and venous phases.  Arising in the proximal left anterior cerebral artery A1 segment, again seen is the saccular aneurysm projecting slightly superior and posteriorly measuring 2.7 mm x 1.6 mm.  Brisk simultaneous cross opacification via the anterior communicating artery of the right anterior cerebral artery A2 segment and the right anterior cerebral artery A1 segment is seen with flow into the right middle cerebral artery and the right MCA bifurcation. 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 Good aspiration was obtained from the side port of the Tuohy-Borst at the hub of the [place] shuttle sheath. A gentle constant injection demonstrated no evidence of spasms, dissections or of intraluminal filling defects.  This was then connected to continuous heparinized saline infusion.  Over the [employer] guidewire, a 6 French 105 cm Navien guide catheter was then advanced and positioned just proximal to the left common carotid bifurcation. The guidewire was removed. Good aspiration was obtained from the hub of the 6 [place] guide catheter. A gentle constant injection demonstrated no evidence of spasm, dissections or of intraluminal filling defects.  Using biplane roadmap technique and constant fluoroscopic guidance, over a 0.035 inch  Roadrunner guidewire, the 6 [place] Navien guide catheter was then advanced to the horizontal petrous portion of the left internal carotid artery using biplane roadmap technique and constant fluoroscopic guidance. The guidewire was removed. Good aspiration was obtained from the hub of the 6 [place] Navien guide catheter.  A gentle constant injection demonstrated no evidence of spasms, dissections or of intraluminal filling defects.  A control arteriogram performed through the 6 [place] Navien guide catheter demonstrated no change in the intracranial circulation.  At this time a Scepter XC 4 mm x 11 mm balloon micro catheter was flushed with heparinized saline infusion in its wire port. The balloon port was then prepared by gently inflating the balloon port with 50% contrast and 50% heparinized saline fusion until there was a drop of the mixture noted at the distal end of the tip of the dilated balloon. This was then submerged in 50% contrast and 50% heparinized saline infusion and rigorously aspirated to deflate the balloon without introducing any air.  Using biplane roadmap technique and constant fluoroscopic guidance, in a coaxial manner and with constant heparinized saline infusion, the Scepter XC balloon micro catheter was then advanced over a 0.014 inch Softip Synchro micro guidewire with a J-tip configuration to the distal end of the 6 [place] Navien guide catheter.  With the micro guidewire leading with a J-tip configuration, and a torque device, the combination was navigated to the supraclinoid  left ICA. The left MCA was then entered with the micro guidewire followed by the micro catheter. The inferior division of the left middle cerebral artery was then entered with the micro guidewire again without difficulty followed by the Scepter XC balloon micro catheter. The combination was navigated to the distal M3 segment of the inferior division of the left middle cerebral artery followed by the Scepter XC balloon  micro catheter. The micro guidewire was then removed. Good aspiration was obtained from the hub of the Scepter XC micro catheter. A gentle constant injection demonstrated no evidence of spasm, dissections or of intraluminal filling defects.  This was then connected to continuous heparinized saline infusion.  Through a separate port on the double Tuohy Borst, a Headway 7, 2 tip micro catheter which had been steam shaped to a J-tip configuration, was then advanced over a 0.014 inch Synchro soft micro guidewire to the distal end of the 6 [place] Navien guide catheter which had now been positioned in the cavernous segment of the left internal carotid artery with the micro guidewire leading with a J-tip configuration to avoid dissections or inducing spasm. The combination was navigated to the supraclinoid left ICA. The left M1 segment was then entered with the micro guidewire followed by the micro catheter. The aneurysm was then entered without difficulty with the micro guidewire followed by the micro catheter. The micro guidewire was then gently retrieved under constant fluoroscopic guidance to ensure no sudden forward motion of the intra-aneurysmal micro catheter tip. None was observed. Good aspiration was obtained from the hub of the intra-aneurysmal micro catheter. This was then connected to continuous heparinized saline infusion.  At this time a 3.5 mm x 23 mm LVIS JR stent device was then purged with heparinized saline infusion and its housing.  This was then advanced in a coaxial manner and with constant heparinized saline infusion using biplane roadmap technique and constant fluoroscopic guidance into the Scepter XC balloon micro catheter, and advanced to the distal end of the balloon micro catheter.  The markers on the stent were then identified. The entire system was then straightened. The stent was then positioned such that the proximal landing zone was in the proximal left middle cerebral artery.  The O ring  on the delivery micro catheter and the delivery micro guidewire of stent were then released. With slight forward gentle traction with the right hand on the delivery micro guidewire, the Scepter XC balloon was then retrieved unsheathing the distal wire on the LVIS Jr stent. Using a gentle retrieval method with extension of the tension the LVIS JR stent was then gently unsheathed and deployed without difficulty with excellent coverage distal and proximal to the left MCA bifurcation aneurysm. The final position of the stent proximally was in the mid M1 segment. Prior to retrieval of the delivery micro guidewire of the stent, a control arteriogram performed through the 6 [place] Navien guide catheter demonstrated excellent apposition and positioning of the LVIS JR stent across the neck of the aneurysm. Also patency was seen of the left MCA bifurcation branches. The tip of the intra-aneurysmal micro catheter remained stable within the aneurysm. Under constant fluoroscopic guidance, the delivery micro guidewire of the LVIS JR stent was then retrieved ensuring no entanglement with the stent struts. None was observed. This was then retrieved and removed. A control arteriogram performed through the 6 [place] Navien guide catheter demonstrates safe positioning of the intra-aneurysmal micro catheter tip for embolization to begin.  The first coil utilized was  a 3.5 mm x 8 cm Hypersoft 3D coil. This was advanced in a coaxial manner and with constant heparinized saline infusion using biplane roadmap technique and constant fluoroscopic guidance to the distal end of the intra-aneurysmal micro catheter. Using biplane roadmap technique, and constant fluoroscopic guidance, this was then deployed into the aneurysm with a nice basket configuration being achieved. Prior to detachment a control arteriogram was then performed through the 6 [place] Navien guide catheter. This confirmed safe positioning within the aneurysm with no coverage of  the superior division of the left middle cerebral artery. This was then detached without difficulty. This was then followed by the placement of a 3 mm x 6 cm Hypersoft helical coil, a 2.5 mm x 4 cm Hypersoft 3D coil, and finally at 2 mm x 2 cm Hypersoft helical coil. Each of these coils was advanced into the aneurysm using biplane roadmap technique and constant fluoroscopic guidance, and deployed after a control arteriogram being performed.  At the end of the final coil, advancement of the next coil was met with resistance and herniation of micro catheter.  Arteriogram performed at 5 minutes after replacement of the final coil demonstrated slight filling defect along the origin of the superior division.  The intra-aneurysmal micro catheter was then gently retrieved under constant fluoroscopic guidance into the distal left M1 segment without difficulty. There was no change in the coil mass.  This prompted the use of 2 mg of superselective infusion of Integrelin with improved clearance at this point. Control arteriograms performed 15 and 25 minutes after the infusion of Integrelin through the 6 [place] Navien guide catheter demonstrated continued obliteration of the aneurysm without change in the coil mass. No filling defects were seen within the [place] stent.  The micro catheter was then gently retrieved and removed ensuring no movement of the stent. None was observed. A final control arteriogram performed through the 6 [place] Navien guide catheter demonstrated complete obliteration of the aneurysm with wide patency of the stent. No other filling defects were seen in the left anterior circulation.  A final control arteriogram performed centered intracranially demonstrated brisk flow through the left anterior circulation involving the left middle cerebral artery and the left anterior cerebral artery into the capillary and venous phases. 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  The 6 [place] guide catheter and the 6 [place] Cook shuttle sheath were then retrieved into the abdominal aorta and exchanged over a J-tip guidewire for a 6 [place] Pinnacle sheath. This was then successfully removed with the application of an external closure device in the right common femoral arteriotomy site. Throughout the procedure the patient's neurological and hemodynamic status remained stable. The patient's ACT was maintained in the region of approximately 180 seconds.  The patient's general anesthesia was then reversed and the patient was extubated. Upon recovery the patient demonstrated no new neurological signs or symptoms. The patient was then transported to the neuro ICU for overnight close neurological observations and to continue on IV heparin and to control the patient's blood pressure within the parameters prescribed.  Toward the end of the day, the patient was able to eat clear liquids without difficulty. He did not have any neurological complaints of nausea, vomiting or headaches.  Overnight the patient did have an episode of a headache of 7/10 in the left retro-orbital anterior temporal region.  This resolved with IV Toradol. No other neurological symptoms were associated with this.  The following day the patient's intra-arterial line was removed. The patient's Foley catheter was removed. The patient then sat in a chair and then ambulated without difficulty independently. He was able to eat a normal meal.  He was started on aspirin 81 mg per day and Plavix 75 mg a day. He was then discharged under care of his family to continue his home meds in addition to the aspirin and Plavix. The patient will be seen in the Outpatient Clinic in 2 weeks time where a decision will be made regarding treatment of his right internal carotid artery 95% plus stenoses, with  right internal carotid artery stenting with distal protection, and approximately 4 weeks from the time of this procedure. 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 The patient and the family leave with good understanding and agreement with the above management plan.  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   Electronically Signed   By: Luanne Bras M.D.   On: 08/13/2014 11:22   Ir Angiogram Follow Up Study  08/13/2014   CLINICAL DATA:  Unusual left-sided headaches. 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 Strong family history of ruptured and unruptured intracranial aneurysms.  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  MEDICATIONS: As per general anesthesia.  CONTRAST:  173m OMNIPAQUE IOHEXOL 300 MG/ML  SOLN  PROCEDURE: Following a full explanation of the procedure along with the potential associated complications, an informed witnessed consent was obtained. The indications, the risks, the benefits and the alternatives had been discussed fully with the patient and the patient's family prior to the procedure.  The patient was put under general anesthesia [facility] The right groin was prepped and draped in the usual sterile fashion. Thereafter using modified Seldinger technique, transfemoral access into the right common femoral artery was obtained without difficulty. Over a 0.035 inch guidewire, a 5 French Pinnacle sheath was inserted. Through this, and also over a 0.035 inch guidewire, a 5 French JB1 catheter was advanced to the aortic arch region and selectively positioned in the innominate artery, the right common carotid artery and left common carotid artery. The patient tolerated the procedure well.  COMPLICATIONS: None immediate.  FINDINGS: The right common carotid arteriogram demonstrates the right external carotid artery to be mildly narrowed at the level of the origin of the right facial artery. The remaining branches are seen to opacify normally.  The right internal carotid artery at the bulb demonstrates focal area of approximately 95% plus stenoses associated with a filling defect with improved caliber and antegrade flow into now a more normal appearing internal carotid artery to the cranial skull base. The petrous, the cavernous and the supraclinoid segments are widely patent. Opacification of the right middle cerebral artery is seen with mixing  of non-opacified blood from the contralateral internal carotid artery via the anterior communicating artery. At the right MCA bifurcation there is a suggestion of a saccular aneurysm projecting superiorly. Non visualization of the right anterior cerebral artery is seen because of the non-opacified blood.  The right vertebral artery origin is normal.  The vessel is seen to opacify normally to the cranial skull base.  Normal opacification is seen of the right vertebrobasilar junction and the right posterior inferior cerebellar artery. The visualized basilar artery, the superior cerebellar arteries and the anterior inferior cerebellar arteries are grossly normal into the delayed arterial phase.  The left  common carotid arteriogram demonstrates the left external carotid artery and its major branches to be normal.  The left internal carotid artery at the bulb to the cranial skull base is seen to opacify normally.  The petrous, cavernous and supraclinoid segments are widely patent.  The left middle and the left anterior cerebral artery opacify normally into the capillary and venous phases.  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  The proximal left middle cerebral artery trunk measures approximately 2.52 mm with the inferior division of the left middle cerebral artery measuring approximately 1.9 mm.  Opacification is also seen of the left anterior cerebral artery into the capillary and venous phases.  Arising in the proximal left anterior cerebral artery A1 segment, again seen is the saccular aneurysm projecting slightly superior and posteriorly measuring 2.7 mm x 1.6 mm.  Brisk simultaneous cross opacification via the anterior communicating artery of the right anterior cerebral artery A2 segment and the right anterior cerebral artery A1 segment is seen with flow into the right middle cerebral artery and the right MCA bifurcation. 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 Good aspiration was obtained from the side port of the Tuohy-Borst at the hub of the [place] shuttle sheath. A gentle constant injection demonstrated no evidence of spasms, dissections or of intraluminal filling  defects.  This was then connected to continuous heparinized saline infusion.  Over the [employer] guidewire, a 6 French 105 cm Navien guide catheter was then advanced and positioned just proximal to the left common carotid bifurcation. The guidewire was removed. Good aspiration was obtained from the hub of the 6 [place] guide catheter. A gentle constant injection demonstrated no evidence of spasm, dissections or of intraluminal filling defects.  Using biplane roadmap technique and constant fluoroscopic guidance, over a 0.035 inch [place] guidewire, the 6 [place] Navien guide catheter was then advanced to the horizontal petrous portion of the left internal carotid artery using biplane roadmap technique and constant fluoroscopic guidance. The guidewire was removed. Good aspiration was obtained from the hub of the 6 [place] Navien guide catheter.  A gentle constant injection demonstrated no evidence of spasms, dissections or of intraluminal filling defects.  A control arteriogram performed through the 6 [place] Navien guide catheter demonstrated no change in the intracranial circulation.  At this time a Scepter XC 4 mm x 11 mm balloon micro catheter  was flushed with heparinized saline infusion in its wire port. The balloon port was then prepared by gently inflating the balloon port with 50% contrast and 50% heparinized saline fusion until there was a drop of the mixture noted at the distal end of the tip of the dilated balloon. This was then submerged in 50% contrast and 50% heparinized saline infusion and rigorously aspirated to deflate the balloon without introducing any air.  Using biplane roadmap technique and constant fluoroscopic guidance, in a coaxial manner and with constant heparinized saline infusion, the Scepter XC balloon micro catheter was then advanced over a 0.014 inch Softip Synchro micro guidewire with a J-tip configuration to the distal end of the 6 [place] Navien guide catheter.  With the micro  guidewire leading with a J-tip configuration, and a torque device, the combination was navigated to the supraclinoid left ICA. The left MCA was then entered with the micro guidewire followed by the micro catheter. The inferior division of the left middle cerebral artery was then entered with the micro guidewire again without difficulty followed by the Scepter XC balloon micro catheter. The combination was navigated to the distal M3 segment of the inferior division of the left middle cerebral artery followed by the Scepter XC balloon micro catheter. The micro guidewire was then removed. Good aspiration was obtained from the hub of the Scepter XC micro catheter. A gentle constant injection demonstrated no evidence of spasm, dissections or of intraluminal filling defects.  This was then connected to continuous heparinized saline infusion.  Through a separate port on the double Tuohy Borst, a Headway 7, 2 tip micro catheter which had been steam shaped to a J-tip configuration, was then advanced over a 0.014 inch Synchro soft micro guidewire to the distal end of the 6 [place] Navien guide catheter which had now been positioned in the cavernous segment of the left internal carotid artery with the micro guidewire leading with a J-tip configuration to avoid dissections or inducing spasm. The combination was navigated to the supraclinoid left ICA. The left M1 segment was then entered with the micro guidewire followed by the micro catheter. The aneurysm was then entered without difficulty with the micro guidewire followed by the micro catheter. The micro guidewire was then gently retrieved under constant fluoroscopic guidance to ensure no sudden forward motion of the intra-aneurysmal micro catheter tip. None was observed. Good aspiration was obtained from the hub of the intra-aneurysmal micro catheter. This was then connected to continuous heparinized saline infusion.  At this time a 3.5 mm x 23 mm LVIS JR stent device was  then purged with heparinized saline infusion and its housing.  This was then advanced in a coaxial manner and with constant heparinized saline infusion using biplane roadmap technique and constant fluoroscopic guidance into the Scepter XC balloon micro catheter, and advanced to the distal end of the balloon micro catheter.  The markers on the stent were then identified. The entire system was then straightened. The stent was then positioned such that the proximal landing zone was in the proximal left middle cerebral artery.  The O ring on the delivery micro catheter and the delivery micro guidewire of stent were then released. With slight forward gentle traction with the right hand on the delivery micro guidewire, the Scepter XC balloon was then retrieved unsheathing the distal wire on the LVIS Jr stent. Using a gentle retrieval method with extension of the tension the LVIS JR stent was then gently unsheathed and deployed without difficulty with excellent  coverage distal and proximal to the left MCA bifurcation aneurysm. The final position of the stent proximally was in the mid M1 segment. Prior to retrieval of the delivery micro guidewire of the stent, a control arteriogram performed through the 6 [place] Navien guide catheter demonstrated excellent apposition and positioning of the LVIS JR stent across the neck of the aneurysm. Also patency was seen of the left MCA bifurcation branches. The tip of the intra-aneurysmal micro catheter remained stable within the aneurysm. Under constant fluoroscopic guidance, the delivery micro guidewire of the LVIS JR stent was then retrieved ensuring no entanglement with the stent struts. None was observed. This was then retrieved and removed. A control arteriogram performed through the 6 [place] Navien guide catheter demonstrates safe positioning of the intra-aneurysmal micro catheter tip for embolization to begin.  The first coil utilized was a 3.5 mm x 8 cm Hypersoft 3D coil. This  was advanced in a coaxial manner and with constant heparinized saline infusion using biplane roadmap technique and constant fluoroscopic guidance to the distal end of the intra-aneurysmal micro catheter. Using biplane roadmap technique, and constant fluoroscopic guidance, this was then deployed into the aneurysm with a nice basket configuration being achieved. Prior to detachment a control arteriogram was then performed through the 6 [place] Navien guide catheter. This confirmed safe positioning within the aneurysm with no coverage of the superior division of the left middle cerebral artery. This was then detached without difficulty. This was then followed by the placement of a 3 mm x 6 cm Hypersoft helical coil, a 2.5 mm x 4 cm Hypersoft 3D coil, and finally at 2 mm x 2 cm Hypersoft helical coil. Each of these coils was advanced into the aneurysm using biplane roadmap technique and constant fluoroscopic guidance, and deployed after a control arteriogram being performed.  At the end of the final coil, advancement of the next coil was met with resistance and herniation of micro catheter.  Arteriogram performed at 5 minutes after replacement of the final coil demonstrated slight filling defect along the origin of the superior division.  The intra-aneurysmal micro catheter was then gently retrieved under constant fluoroscopic guidance into the distal left M1 segment without difficulty. There was no change in the coil mass.  This prompted the use of 2 mg of superselective infusion of Integrelin with improved clearance at this point. Control arteriograms performed 15 and 25 minutes after the infusion of Integrelin through the 6 [place] Navien guide catheter demonstrated continued obliteration of the aneurysm without change in the coil mass. No filling defects were seen within the [place] stent.  The micro catheter was then gently retrieved and removed ensuring no movement of the stent. None was observed. A final control  arteriogram performed through the 6 [place] Navien guide catheter demonstrated complete obliteration of the aneurysm with wide patency of the stent. No other filling defects were seen in the left anterior circulation.  A final control arteriogram performed centered intracranially demonstrated brisk flow through the left anterior circulation involving the left middle cerebral artery and the left anterior cerebral artery into the capillary and venous phases. 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  The 6 [place] guide catheter and the 6 [place] Cook shuttle sheath were then retrieved into the abdominal aorta and exchanged over a J-tip guidewire for a 6 [place] Pinnacle sheath. This was  then successfully removed with the application of an external closure device in the right common femoral arteriotomy site. Throughout the procedure the patient's neurological and hemodynamic status remained stable. The patient's ACT was maintained in the region of approximately 180 seconds.  The patient's general anesthesia was then reversed and the patient was extubated. Upon recovery the patient demonstrated no new neurological signs or symptoms. The patient was then transported to the neuro ICU for overnight close neurological observations and to continue on IV heparin and to control the patient's blood pressure within the parameters prescribed.  Toward the end of the day, the patient was able to eat clear liquids without difficulty. He did not have any neurological complaints of nausea, vomiting or headaches.  Overnight the patient did have an episode of a headache of 7/10 in the left retro-orbital anterior temporal region. This resolved with IV Toradol. No other neurological symptoms were associated with this.  The following day the patient's  intra-arterial line was removed. The patient's Foley catheter was removed. The patient then sat in a chair and then ambulated without difficulty independently. He was able to eat a normal meal.  He was started on aspirin 81 mg per day and Plavix 75 mg a day. He was then discharged under care of his family to continue his home meds in addition to the aspirin and Plavix. 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 The patient and the family leave with good understanding and agreement with the above management plan.  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   Electronically Signed   By: Luanne Bras M.D.   On: 08/13/2014 11:22   Ir Angiogram Follow Up Study  08/13/2014   CLINICAL DATA:  Unusual left-sided headaches. 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 Strong family history of ruptured and unruptured intracranial aneurysms.  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  MEDICATIONS: As per general anesthesia.  CONTRAST:  131m OMNIPAQUE IOHEXOL 300 MG/ML  SOLN  PROCEDURE: Following a full explanation of the procedure along with the potential associated complications, an informed witnessed consent was obtained. The indications, the risks, the benefits and the alternatives had been discussed fully with the patient and the patient's family prior to the procedure.  The patient was [facility] under general [facility] The right groin was prepped and draped in the usual sterile fashion. Thereafter using modified Seldinger technique, transfemoral access into the right common femoral artery was obtained without difficulty. Over a 0.035 inch guidewire, a 5 [place] Pinnacle sheath was inserted. Through this, and  also over a 0.035 inch guidewire, a 5 [place] JB1 catheter was advanced to the aortic arch region and selectively positioned in the innominate artery, the right common carotid artery and left common carotid artery. The patient tolerated the procedure well.  COMPLICATIONS: None immediate.  FINDINGS: The right common carotid arteriogram demonstrates the right external carotid artery to be mildly narrowed at the level of the origin of the right facial artery. The remaining branches are seen to opacify normally.  The right internal carotid artery at the bulb demonstrates focal area of approximately 95% plus stenoses associated with a filling defect with improved caliber and antegrade flow into now a more normal appearing internal carotid artery to the cranial skull base. The petrous, the cavernous and the supraclinoid segments are widely patent. Opacification of the right middle cerebral artery is seen with mixing of non-opacified blood from the contralateral internal carotid artery via the anterior communicating artery. At the right MCA bifurcation there is a suggestion  of a saccular aneurysm projecting superiorly. Non visualization of the right anterior cerebral artery is seen because of the non-opacified blood.  The right vertebral artery origin is normal.  The vessel is seen to opacify normally to the cranial skull base.  Normal opacification is seen of the right vertebrobasilar junction and the right posterior inferior cerebellar artery. The visualized basilar artery, the superior cerebellar arteries and the anterior inferior cerebellar arteries are grossly normal into the delayed arterial phase.  The left common carotid arteriogram demonstrates the left external carotid artery and its major branches to be normal.  The left internal carotid artery at the bulb to the cranial skull base is seen to opacify normally.  The petrous, cavernous and supraclinoid segments are widely patent.  The left middle and the left anterior cerebral artery opacify normally into the capillary and venous phases.  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  The proximal left middle cerebral artery trunk measures approximately 2.52 mm with the inferior division of the left middle cerebral artery measuring approximately 1.9 mm.  Opacification is also seen of the left anterior cerebral artery into the capillary and venous phases.  Arising in the proximal left anterior cerebral artery A1 segment, again seen is the saccular aneurysm projecting slightly superior and posteriorly measuring 2.7 mm x 1.6 mm.  Brisk simultaneous cross opacification via the anterior communicating artery of the right anterior cerebral artery A2 segment and the right anterior cerebral artery A1 segment is seen with flow into the right middle cerebral artery and the right MCA bifurcation. 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 Good aspiration was obtained from the side port of the Tuohy-Borst at the hub of the [place] shuttle sheath. A gentle constant injection demonstrated no evidence of spasms, dissections or of intraluminal filling defects.  This was then connected to continuous heparinized saline infusion.  Over the [employer] guidewire, a 6 French 105 cm Navien guide catheter was then advanced and positioned just proximal to the left common  carotid bifurcation. The guidewire was removed. Good aspiration was obtained from the hub of the 6 [place] guide catheter. A gentle constant injection demonstrated no evidence of spasm, dissections or of intraluminal filling defects.  Using biplane roadmap technique and constant fluoroscopic guidance, over a 0.035 inch Roadrunner guidewire, the 6 [place] Navien guide catheter was then advanced to the horizontal petrous portion of the left internal carotid artery using biplane roadmap technique and constant fluoroscopic guidance. The guidewire was removed. Good aspiration was obtained from the hub of the 6 [place] Navien guide catheter.  A gentle constant injection demonstrated no evidence of spasms, dissections or of intraluminal filling defects.  A control arteriogram performed through the 6 [place] Navien guide catheter demonstrated no change in the intracranial circulation.  At this time a Scepter XC 4 mm x 11 mm balloon micro catheter was flushed with heparinized saline infusion in its wire port. The balloon port was then prepared by gently inflating the balloon port with 50% contrast and 50% heparinized saline fusion until there was a drop of the mixture  noted at the distal end of the tip of the dilated balloon. This was then submerged in 50% contrast and 50% heparinized saline infusion and rigorously aspirated to deflate the balloon without introducing any air.  Using biplane roadmap technique and constant fluoroscopic guidance, in a coaxial manner and with constant heparinized saline infusion, the Scepter XC balloon micro catheter was then advanced over a 0.014 inch Softip Synchro micro guidewire with a J-tip configuration to the distal end of the 6 [place] Navien guide catheter.  With the micro guidewire leading with a J-tip configuration, and a torque device, the combination was navigated to the supraclinoid left ICA. The left MCA was then entered with the micro guidewire followed by the micro catheter. The inferior division of the left middle cerebral artery was then entered with the micro guidewire again without difficulty followed by the Scepter XC balloon micro catheter. The combination was navigated to the distal M3 segment of the inferior division of the left middle cerebral artery followed by the Scepter XC balloon micro catheter. The micro guidewire was then removed. Good aspiration was obtained from the hub of the Scepter XC micro catheter. A gentle constant injection demonstrated no evidence of spasm, dissections or of intraluminal filling defects.  This was then connected to continuous heparinized saline infusion.  Through a separate port on the double Tuohy Borst, a Headway 7, 2 tip micro catheter which had been steam shaped to a J-tip configuration, was then advanced over a 0.014 inch Synchro soft micro guidewire to the distal end of the 6 [place] Navien guide catheter which had now been positioned in the cavernous segment of the left internal carotid artery with the micro guidewire leading with a J-tip configuration to avoid dissections or inducing spasm. The combination was navigated to the supraclinoid left ICA. The left M1 segment was then entered  with the micro guidewire followed by the micro catheter. The aneurysm was then entered without difficulty with the micro guidewire followed by the micro catheter. The micro guidewire was then gently retrieved under constant fluoroscopic guidance to ensure no sudden forward motion of the intra-aneurysmal micro catheter tip. None was observed. Good aspiration was obtained from the hub of the intra-aneurysmal micro catheter. This was then connected to continuous heparinized saline infusion.  At this time a 3.5 mm x 23 mm LVIS JR stent device was then purged with heparinized saline infusion and its housing.  This was  then advanced in a coaxial manner and with constant heparinized saline infusion using biplane roadmap technique and constant fluoroscopic guidance into the Scepter XC balloon micro catheter, and advanced to the distal end of the balloon micro catheter.  The markers on the stent were then identified. The entire system was then straightened. The stent was then positioned such that the proximal landing zone was in the proximal left middle cerebral artery.  The O ring on the delivery micro catheter and the delivery micro guidewire of stent were then released. With slight forward gentle traction with the right hand on the delivery micro guidewire, the Scepter XC balloon was then retrieved unsheathing the distal wire on the LVIS Jr stent. Using a gentle retrieval method with extension of the tension the LVIS JR stent was then gently unsheathed and deployed without difficulty with excellent coverage distal and proximal to the left MCA bifurcation aneurysm. The final position of the stent proximally was in the mid M1 segment. Prior to retrieval of the delivery micro guidewire of the stent, a control arteriogram performed through the 6 [place] Navien guide catheter demonstrated excellent apposition and positioning of the LVIS JR stent across the neck of the aneurysm. Also patency was seen of the left MCA bifurcation  branches. The tip of the intra-aneurysmal micro catheter remained stable within the aneurysm. Under constant fluoroscopic guidance, the delivery micro guidewire of the LVIS JR stent was then retrieved ensuring no entanglement with the stent struts. None was observed. This was then retrieved and removed. A control arteriogram performed through the 6 [place] Navien guide catheter demonstrates safe positioning of the intra-aneurysmal micro catheter tip for embolization to begin.  The first coil utilized was a 3.5 mm x 8 cm Hypersoft 3D coil. This was advanced in a coaxial manner and with constant heparinized saline infusion using biplane roadmap technique and constant fluoroscopic guidance to the distal end of the intra-aneurysmal micro catheter. Using biplane roadmap technique, and constant fluoroscopic guidance, this was then deployed into the aneurysm with a nice basket configuration being achieved. Prior to detachment a control arteriogram was then performed through the 6 [place] Navien guide catheter. This confirmed safe positioning within the aneurysm with no coverage of the superior division of the left middle cerebral artery. This was then detached without difficulty. This was then followed by the placement of a 3 mm x 6 cm Hypersoft helical coil, a 2.5 mm x 4 cm Hypersoft 3D coil, and finally at 2 mm x 2 cm Hypersoft helical coil. Each of these coils was advanced into the aneurysm using biplane roadmap technique and constant fluoroscopic guidance, and deployed after a control arteriogram being performed.  At the end of the final coil, advancement of the next coil was met with resistance and herniation of micro catheter.  Arteriogram performed at 5 minutes after replacement of the final coil demonstrated slight filling defect along the origin of the superior division.  The intra-aneurysmal micro catheter was then gently retrieved under constant fluoroscopic guidance into the distal left M1 segment without  difficulty. There was no change in the coil mass.  This prompted the use of 2 mg of superselective infusion of Integrelin with improved clearance at this point. Control arteriograms performed 15 and 25 minutes after the infusion of Integrelin through the 6 [place] Navien guide catheter demonstrated continued obliteration of the aneurysm without change in the coil mass. No filling defects were seen within the [place] stent.  The micro catheter was then gently retrieved and  removed ensuring no movement of the stent. None was observed. A final control arteriogram performed through the 6 [place] Navien guide catheter demonstrated complete obliteration of the aneurysm with wide patency of the stent. No other filling defects were seen in the left anterior circulation.  A final control arteriogram performed centered intracranially demonstrated brisk flow through the left anterior circulation involving the left middle cerebral artery and the left anterior cerebral artery into the capillary and venous phases. 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  The 6 [place] guide catheter and the 6 [place] Cook shuttle sheath were then retrieved into the abdominal aorta and exchanged over a J-tip guidewire for a 6 [place] Pinnacle sheath. This was then successfully removed with the application of an external closure device in the right common femoral arteriotomy site. Throughout the procedure the patient's neurological and hemodynamic status remained stable. The patient's ACT was maintained in the region of approximately 180 seconds.  The patient's general anesthesia was then reversed and the patient was extubated. Upon recovery the patient demonstrated no new neurological signs or symptoms. The patient was then transported to the neuro ICU for overnight close  neurological observations and to continue on IV heparin and to control the patient's blood pressure within the parameters prescribed.  Toward the end of the day, the patient was able to eat clear liquids without difficulty. He did not have any neurological complaints of nausea, vomiting or headaches.  Overnight the patient did have an episode of a headache of 7/10 in the left retro-orbital anterior temporal region. This resolved with IV Toradol. No other neurological symptoms were associated with this.  The following day the patient's intra-arterial line was removed. The patient's Foley catheter was removed. The patient then sat in a chair and then ambulated without difficulty independently. He was able to eat a normal meal.  He was started on aspirin 81 mg per day and Plavix 75 mg a day. He was then discharged under care of his family to continue his home meds in addition to the aspirin and Plavix. 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 The patient and the family leave with good understanding and agreement with the above management plan.  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   Electronically Signed   By: Luanne Bras M.D.   On: 08/13/2014 11:22   Ir Angiogram Follow Up  Study  08/13/2014   CLINICAL DATA:  Unusual left-sided headaches. 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 Strong family history of ruptured and unruptured intracranial aneurysms.  EXAM: TRANSCATHETER THERAPY EMBOLIZATION BILATERAL COMMON CAROTID, RIGHT VERTEBRAL  ARTERY ARTERIOGRAMS FOLLOWED BY ENDOVASCULAR COMPLETE OBLITERATION OF UNRUPTURED LEFT MIDDLE CEREBRAL ARTERY REGION ANEURYSM WITH STENT ASSISTED COILING:  ANESTHESIA/SEDATION: General anesthesia.  MEDICATIONS: As per general anesthesia.  CONTRAST:  165m OMNIPAQUE IOHEXOL 300 MG/ML  SOLN  PROCEDURE: Following a full explanation of the procedure along with the potential associated complications, an informed witnessed consent was obtained. The indications, the risks, the benefits and the alternatives had been discussed fully with the patient and the patient's family prior to the procedure.  The patient was [facility] under general [facility] The right groin was prepped and draped in the usual sterile fashion. Thereafter using modified Seldinger technique, transfemoral access into the right common femoral artery was obtained without difficulty. Over a 0.035 inch guidewire, a 5 French Pinnacle sheath was inserted. Through this, and also over a 0.035 inch guidewire, a 5 French JB1 catheter was advanced to the aortic arch region and selectively positioned in the innominate artery, the right common carotid artery and left common carotid artery. The patient tolerated the procedure well.  COMPLICATIONS: None immediate.  FINDINGS: The right common carotid arteriogram demonstrates the right external carotid artery to be mildly narrowed at the level of the origin of the right facial artery. The remaining branches are seen to opacify normally.  The right  internal carotid artery at the bulb demonstrates focal area of approximately 95% plus stenoses associated with a filling defect with improved caliber and antegrade flow into now a more normal appearing internal carotid artery to the cranial skull base. The petrous, the cavernous and the supraclinoid segments are widely patent. Opacification of the right middle cerebral artery is seen with mixing of non-opacified blood from the contralateral internal carotid artery via the anterior communicating artery. At the right MCA bifurcation there is a suggestion of a saccular aneurysm projecting superiorly. Non visualization of the right anterior cerebral artery is seen because of the non-opacified blood.  The right vertebral artery origin is normal.  The vessel is seen to opacify normally to the cranial skull base.  Normal opacification is seen of the right vertebrobasilar junction and the right posterior inferior cerebellar artery. The visualized basilar artery, the superior cerebellar arteries and the anterior inferior cerebellar arteries are grossly normal into the delayed arterial phase.  The left common carotid arteriogram demonstrates the left external carotid artery and its major branches to be normal.  The left internal carotid artery at the bulb to the cranial skull base is seen to opacify normally.  The petrous, cavernous and supraclinoid segments are widely patent.  The left middle and the left anterior cerebral artery opacify normally into the capillary and venous phases.  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  The proximal left middle cerebral artery trunk measures approximately 2.52 mm with the inferior division of the left middle cerebral artery measuring approximately 1.9 mm.  Opacification is also seen of the left anterior cerebral artery into the capillary and venous phases.  Arising in the proximal left anterior  cerebral artery A1 segment, again seen is the saccular aneurysm projecting slightly superior and posteriorly measuring 2.7 mm x 1.6 mm.  Brisk simultaneous cross opacification via the anterior communicating artery of the right anterior cerebral artery A2 segment and the right anterior cerebral artery A1 segment is seen with flow into the right middle cerebral artery and the right MCA bifurcation. Again demonstrated  is a saccular aneurysm arising in the region of the right middle cerebral artery bifurcation.  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 Good aspiration was obtained from the side port of the Tuohy-Borst at the hub of the [place] shuttle sheath. A gentle constant injection demonstrated no evidence of spasms, dissections or of intraluminal filling defects.  This was then connected to continuous heparinized saline infusion.  Over the [employer] guidewire, a 6 French 105 cm Navien guide catheter was then advanced and positioned just proximal to the left common carotid bifurcation. The guidewire was removed. Good aspiration was obtained from the hub of the 6 [place] guide catheter. A gentle constant injection demonstrated no evidence of spasm, dissections or of intraluminal filling defects.  Using biplane roadmap technique and constant fluoroscopic guidance, over a 0.035 inch Roadrunner guidewire, the 6 [place] Navien guide catheter was then advanced to the horizontal petrous portion of the left internal carotid artery using biplane roadmap technique and constant fluoroscopic guidance. The guidewire was removed. Good aspiration was obtained from the hub of the 6 [place]  Navien guide catheter.  A gentle constant injection demonstrated no evidence of spasms, dissections or of intraluminal filling defects.  A control arteriogram performed through the 6 [place] Navien guide catheter demonstrated no change in the intracranial circulation.  At this time a Scepter XC 4 mm x 11 mm balloon micro catheter was flushed with heparinized saline infusion in its wire port. The balloon port was then prepared by gently inflating the balloon port with 50% contrast and 50% heparinized saline fusion until there was a drop of the mixture noted at the distal end of the tip of the dilated balloon. This was then submerged in 50% contrast and 50% heparinized saline infusion and rigorously aspirated to deflate the balloon without introducing any air.  Using biplane roadmap technique and constant fluoroscopic guidance, in a coaxial manner and with constant heparinized saline infusion, the Scepter XC balloon micro catheter was then advanced over a 0.014 inch Softip Synchro micro guidewire with a J-tip configuration to the distal end of the 6 [place] Navien guide catheter.  With the micro guidewire leading with a J-tip configuration, and a torque device, the combination was navigated to the supraclinoid left ICA. The left MCA was then entered with the micro guidewire followed by the micro catheter. The inferior division of the left middle cerebral artery was then entered with the micro guidewire again without difficulty followed by the Scepter XC balloon micro catheter. The combination was navigated to the distal M3 segment of the inferior division of the left middle cerebral artery followed by the Scepter XC balloon micro catheter. The micro guidewire was then removed. Good aspiration was obtained from the hub of the Scepter XC micro catheter. A gentle constant injection demonstrated no evidence of spasm, dissections or of intraluminal filling defects.  This was then connected to continuous heparinized saline  infusion.  Through a separate port on the double Tuohy Borst, a Headway 7, 2 tip micro catheter which had been steam shaped to a J-tip configuration, was then advanced over a 0.014 inch Synchro soft micro guidewire to the distal end of the 6 [place] Navien guide catheter  which had now been positioned in the cavernous segment of the left internal carotid artery with the micro guidewire leading with a J-tip configuration to avoid dissections or inducing spasm. The combination was navigated to the supraclinoid left ICA. The left M1 segment was then entered with the micro guidewire followed by the micro catheter. The aneurysm was then entered without difficulty with the micro guidewire followed by the micro catheter. The micro guidewire was then gently retrieved under constant fluoroscopic guidance to ensure no sudden forward motion of the intra-aneurysmal micro catheter tip. None was observed. Good aspiration was obtained from the hub of the intra-aneurysmal micro catheter. This was then connected to continuous heparinized saline infusion.  At this time a 3.5 mm x 23 mm LVIS JR stent device was then purged with heparinized saline infusion and its housing.  This was then advanced in a coaxial manner and with constant heparinized saline infusion using biplane roadmap technique and constant fluoroscopic guidance into the Scepter XC balloon micro catheter, and advanced to the distal end of the balloon micro catheter.  The markers on the stent were then identified. The entire system was then straightened. The stent was then positioned such that the proximal landing zone was in the proximal left middle cerebral artery.  The O ring on the delivery micro catheter and the delivery micro guidewire of stent were then released. With slight forward gentle traction with the right hand on the delivery micro guidewire, the Scepter XC balloon was then retrieved unsheathing the distal wire on the LVIS Jr stent. Using a gentle retrieval  method with extension of the tension the LVIS JR stent was then gently unsheathed and deployed without difficulty with excellent coverage distal and proximal to the left MCA bifurcation aneurysm. The final position of the stent proximally was in the mid M1 segment. Prior to retrieval of the delivery micro guidewire of the stent, a control arteriogram performed through the 6 [place] Navien guide catheter demonstrated excellent apposition and positioning of the LVIS JR stent across the neck of the aneurysm. Also patency was seen of the left MCA bifurcation branches. The tip of the intra-aneurysmal micro catheter remained stable within the aneurysm. Under constant fluoroscopic guidance, the delivery micro guidewire of the LVIS JR stent was then retrieved ensuring no entanglement with the stent struts. None was observed. This was then retrieved and removed. A control arteriogram performed through the 6 [place] Navien guide catheter demonstrates safe positioning of the intra-aneurysmal micro catheter tip for embolization to begin.  The first coil utilized was a 3.5 mm x 8 cm Hypersoft 3D coil. This was advanced in a coaxial manner and with constant heparinized saline infusion using biplane roadmap technique and constant fluoroscopic guidance to the distal end of the intra-aneurysmal micro catheter. Using biplane roadmap technique, and constant fluoroscopic guidance, this was then deployed into the aneurysm with a nice basket configuration being achieved. Prior to detachment a control arteriogram was then performed through the 6 [place] Navien guide catheter. This confirmed safe positioning within the aneurysm with no coverage of the superior division of the left middle cerebral artery. This was then detached without difficulty. This was then followed by the placement of a 3 mm x 6 cm Hypersoft helical coil, a 2.5 mm x 4 cm Hypersoft 3D coil, and finally at 2 mm x 2 cm Hypersoft helical coil. Each of these coils was advanced  into the aneurysm using biplane roadmap technique and constant fluoroscopic guidance, and deployed after a control arteriogram  being performed.  At the end of the final coil, advancement of the next coil was met with resistance and herniation of micro catheter.  Arteriogram performed at 5 minutes after replacement of the final coil demonstrated slight filling defect along the origin of the superior division.  The intra-aneurysmal micro catheter was then gently retrieved under constant fluoroscopic guidance into the distal left M1 segment without difficulty. There was no change in the coil mass.  This prompted the use of 2 mg of superselective infusion of Integrelin with improved clearance at this point. Control arteriograms performed 15 and 25 minutes after the infusion of Integrelin through the 6 [place] Navien guide catheter demonstrated continued obliteration of the aneurysm without change in the coil mass. No filling defects were seen within the [place] stent.  The micro catheter was then gently retrieved and removed ensuring no movement of the stent. None was observed. A final control arteriogram performed through the 6 [place] Navien guide catheter demonstrated complete obliteration of the aneurysm with wide patency of the stent. No other filling defects were seen in the left anterior circulation.  A final control arteriogram performed centered intracranially demonstrated brisk flow through the left anterior circulation involving the left middle cerebral artery and the left anterior cerebral artery into the capillary and venous phases. 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  The 6 [place] guide catheter and the 6 [place] Cook shuttle sheath were then retrieved into the abdominal aorta and exchanged over a J-tip guidewire for a 6  [place] Pinnacle sheath. This was then successfully removed with the application of an external closure device in the right common femoral arteriotomy site. Throughout the procedure the patient's neurological and hemodynamic status remained stable. The patient's ACT was maintained in the region of approximately 180 seconds.  The patient's general anesthesia was then reversed and the patient was extubated. Upon recovery the patient demonstrated no new neurological signs or symptoms. The patient was then transported to the neuro ICU for overnight close neurological observations and to continue on IV heparin and to control the patient's blood pressure within the parameters prescribed.  Toward the end of the day, the patient was able to eat clear liquids without difficulty. He did not have any neurological complaints of nausea, vomiting or headaches.  Overnight the patient did have an episode of a headache of 7/10 in the left retro-orbital anterior temporal region. This resolved with IV Toradol. No other neurological symptoms were associated with this.  The following day the patient's intra-arterial line was removed. The patient's Foley catheter was removed. The patient then sat in a chair and then ambulated without difficulty independently. He was able to eat a normal meal.  He was started on aspirin 81 mg per day and Plavix 75 mg a day. He was then discharged under care of his family to continue his home meds in addition to the aspirin and Plavix. 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 The patient and the family leave with good understanding and agreement  with  the above management plan.  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   Electronically Signed   By: Luanne Bras M.D.   On: 08/13/2014 11:22   Ir Angiogram Follow Up Study  08/13/2014   CLINICAL DATA:  Unusual left-sided headaches. 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 Strong family history of ruptured and unruptured intracranial aneurysms.  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  MEDICATIONS: As per general anesthesia.  CONTRAST:  17m OMNIPAQUE IOHEXOL 300 MG/ML  SOLN  PROCEDURE: Following a full explanation of the procedure along with the potential associated complications, an informed witnessed consent was obtained. The indications, the risks, the benefits and the alternatives had been discussed fully with the patient and the patient's family prior to the procedure.  The patient was put under general anesthesia by [facility] The right groin was prepped and draped in the usual sterile fashion. Thereafter using modified Seldinger technique, transfemoral access into the right common femoral artery was obtained without difficulty. Over a 0.035 inch guidewire, a 5 French Pinnacle sheath was inserted.  Through this, and also over a 0.035 inch guidewire, a 5 French JB1 catheter was advanced to the aortic arch region and selectively positioned in the innominate artery, the right common carotid artery and left common carotid artery. The patient tolerated the procedure well.  COMPLICATIONS: None immediate.  FINDINGS: The right common carotid arteriogram demonstrates the right external carotid artery to be mildly narrowed at the level of the origin of the right facial artery. The remaining branches are seen to opacify normally.  The right internal carotid artery at the bulb demonstrates focal area of approximately 95% plus stenoses associated with a filling defect with improved caliber and antegrade flow into now a more normal appearing internal carotid artery to the cranial skull base. The petrous, the cavernous and the supraclinoid segments are widely patent. Opacification of the right middle cerebral artery is seen with mixing of non-opacified blood from the contralateral internal carotid artery via the anterior communicating artery. At the right MCA bifurcation there is a suggestion of a saccular aneurysm projecting superiorly. Non visualization of the right anterior cerebral artery is seen because of the non-opacified blood.  The right vertebral artery origin is normal.  The vessel is seen to opacify normally to the cranial skull base.  Normal opacification is seen of the right vertebrobasilar junction and the right posterior inferior cerebellar artery. The visualized basilar artery, the superior cerebellar arteries and the anterior inferior cerebellar arteries are grossly normal into the delayed arterial phase.  The left common carotid arteriogram demonstrates the left external carotid artery and its major branches to be normal.  The left internal carotid artery at the bulb to the cranial skull base is seen to opacify normally.  The petrous, cavernous and supraclinoid segments are widely patent.  The left middle  and the left anterior cerebral artery opacify  normally into the capillary and venous phases.  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  The proximal left middle cerebral artery trunk measures approximately 2.52 mm with the inferior division of the left middle cerebral artery measuring approximately 1.9 mm.  Opacification is also seen of the left anterior cerebral artery into the capillary and venous phases.  Arising in the proximal left anterior cerebral artery A1 segment, again seen is the saccular aneurysm projecting slightly superior and posteriorly measuring 2.7 mm x 1.6 mm.  Brisk simultaneous cross opacification via the anterior communicating artery of the right anterior cerebral artery A2 segment and the right anterior cerebral artery A1 segment is seen with flow into the right middle cerebral artery and the right MCA bifurcation. 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 Good aspiration was obtained from the side port of the Tuohy-Borst at the hub of the [place] shuttle sheath. A gentle constant injection demonstrated no evidence of spasms, dissections or of intraluminal filling defects.  This was then connected to continuous heparinized saline infusion.  Over the [employer] guidewire, a 6 French 105 cm Navien guide catheter was then advanced and positioned just proximal to the left common carotid bifurcation. The guidewire was removed. Good aspiration was obtained  from the hub of the 6 [place] guide catheter. A gentle constant injection demonstrated no evidence of spasm, dissections or of intraluminal filling defects.  Using biplane roadmap technique and constant fluoroscopic guidance, over a 0.035 inch [place] guidewire, the 6 [place] Navien guide catheter was then advanced to the horizontal petrous portion of the left internal carotid artery using biplane roadmap technique and constant fluoroscopic guidance. The guidewire was removed. Good aspiration was obtained from the hub of the 6 [place] Navien guide catheter.  A gentle constant injection demonstrated no evidence of spasms, dissections or of intraluminal filling defects.  A control arteriogram performed through the 6 [place] Navien guide catheter demonstrated no change in the intracranial circulation.  At this time a Scepter XC 4 mm x 11 mm balloon micro catheter was flushed with heparinized saline infusion in its wire port. The balloon port was then prepared by gently inflating the balloon port with 50% contrast and 50% heparinized saline fusion until there was a drop of the mixture noted at the distal end of the tip of the dilated balloon. This was then submerged in 50% contrast and 50% heparinized saline infusion and rigorously aspirated to deflate the balloon without introducing any air.  Using biplane roadmap technique and constant fluoroscopic guidance, in a coaxial manner and with constant heparinized saline infusion, the Scepter XC balloon micro catheter was then advanced over a 0.014 inch Softip Synchro micro guidewire with a J-tip configuration to the distal end of the 6 [place] Navien guide catheter.  With the micro guidewire leading with a J-tip configuration, and a torque device, the combination was navigated to the supraclinoid left ICA. The left MCA was then entered with the micro guidewire followed by the micro catheter. The inferior  division of the left middle cerebral artery was then entered with the  micro guidewire again without difficulty followed by the Scepter XC balloon micro catheter. The combination was navigated to the distal M3 segment of the inferior division of the left middle cerebral artery followed by the Scepter XC balloon micro catheter. The micro guidewire was then removed. Good aspiration was obtained from the hub of the Scepter XC micro catheter. A gentle constant injection demonstrated no evidence of spasm, dissections or of intraluminal filling defects.  This was then connected to continuous heparinized saline infusion.  Through a separate port on the double Tuohy Borst, a Headway 7, 2 tip micro catheter which had been steam shaped to a J-tip configuration, was then advanced over a 0.014 inch Synchro soft micro guidewire to the distal end of the 6 [place] Navien guide catheter which had now been positioned in the cavernous segment of the left internal carotid artery with the micro guidewire leading with a J-tip configuration to avoid dissections or inducing spasm. The combination was navigated to the supraclinoid left ICA. The left M1 segment was then entered with the micro guidewire followed by the micro catheter. The aneurysm was then entered without difficulty with the micro guidewire followed by the micro catheter. The micro guidewire was then gently retrieved under constant fluoroscopic guidance to ensure no sudden forward motion of the intra-aneurysmal micro catheter tip. None was observed. Good aspiration was obtained from the hub of the intra-aneurysmal micro catheter. This was then connected to continuous heparinized saline infusion.  At this time a 3.5 mm x 23 mm LVIS JR stent device was then purged with heparinized saline infusion and its housing.  This was then advanced in a coaxial manner and with constant heparinized saline infusion using biplane roadmap technique and constant fluoroscopic guidance into the Scepter XC balloon micro catheter, and advanced to the distal end of the  balloon micro catheter.  The markers on the stent were then identified. The entire system was then straightened. The stent was then positioned such that the proximal landing zone was in the proximal left middle cerebral artery.  The O ring on the delivery micro catheter and the delivery micro guidewire of stent were then released. With slight forward gentle traction with the right hand on the delivery micro guidewire, the Scepter XC balloon was then retrieved unsheathing the distal wire on the LVIS Jr stent. Using a gentle retrieval method with extension of the tension the LVIS JR stent was then gently unsheathed and deployed without difficulty with excellent coverage distal and proximal to the left MCA bifurcation aneurysm. The final position of the stent proximally was in the mid M1 segment. Prior to retrieval of the delivery micro guidewire of the stent, a control arteriogram performed through the 6 [place] Navien guide catheter demonstrated excellent apposition and positioning of the LVIS JR stent across the neck of the aneurysm. Also patency was seen of the left MCA bifurcation branches. The tip of the intra-aneurysmal micro catheter remained stable within the aneurysm. Under constant fluoroscopic guidance, the delivery micro guidewire of the LVIS JR stent was then retrieved ensuring no entanglement with the stent struts. None was observed. This was then retrieved and removed. A control arteriogram performed through the 6 [place] Navien guide catheter demonstrates safe positioning of the intra-aneurysmal micro catheter tip for embolization to begin.  The first coil utilized was a 3.5 mm x 8 cm Hypersoft 3D coil. This was advanced in a coaxial manner and with constant  heparinized saline infusion using biplane roadmap technique and constant fluoroscopic guidance to the distal end of the intra-aneurysmal micro catheter. Using biplane roadmap technique, and constant fluoroscopic guidance, this was then deployed into  the aneurysm with a nice basket configuration being achieved. Prior to detachment a control arteriogram was then performed through the 6 [place] Navien guide catheter. This confirmed safe positioning within the aneurysm with no coverage of the superior division of the left middle cerebral artery. This was then detached without difficulty. This was then followed by the placement of a 3 mm x 6 cm Hypersoft helical coil, a 2.5 mm x 4 cm Hypersoft 3D coil, and finally at 2 mm x 2 cm Hypersoft helical coil. Each of these coils was advanced into the aneurysm using biplane roadmap technique and constant fluoroscopic guidance, and deployed after a control arteriogram being performed.  At the end of the final coil, advancement of the next coil was met with resistance and herniation of micro catheter.  Arteriogram performed at 5 minutes after replacement of the final coil demonstrated slight filling defect along the origin of the superior division.  The intra-aneurysmal micro catheter was then gently retrieved under constant fluoroscopic guidance into the distal left M1 segment without difficulty. There was no change in the coil mass.  This prompted the use of 2 mg of superselective infusion of Integrelin with improved clearance at this point. Control arteriograms performed 15 and 25 minutes after the infusion of Integrelin through the 6 [place] Navien guide catheter demonstrated continued obliteration of the aneurysm without change in the coil mass. No filling defects were seen within the [place] stent.  The micro catheter was then gently retrieved and removed ensuring no movement of the stent. None was observed. A final control arteriogram performed through the 6 [place] Navien guide catheter demonstrated complete obliteration of the aneurysm with wide patency of the stent. No other filling defects were seen in the left anterior circulation.  A final control arteriogram performed centered intracranially demonstrated brisk flow  through the left anterior circulation involving the left middle cerebral artery and the left anterior cerebral artery into the capillary and venous phases. 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  The 6 [place] guide catheter and the 6 [place] Cook shuttle sheath were then retrieved into the abdominal aorta and exchanged over a J-tip guidewire for a 6 [place] Pinnacle sheath. This was then successfully removed with the application of an external closure device in the right common femoral arteriotomy site. Throughout the procedure the patient's neurological and hemodynamic status remained stable. The patient's ACT was maintained in the region of approximately 180 seconds.  The patient's general anesthesia was then reversed and the patient was extubated. Upon recovery the patient demonstrated no new neurological signs or symptoms. The patient was then transported to the neuro ICU for overnight close neurological observations and to continue on IV heparin and to control the patient's blood pressure within the parameters prescribed.  Toward the end of the day, the patient was able to eat clear liquids without difficulty. He did not have any neurological complaints of nausea, vomiting or headaches.  Overnight the patient did have an episode of a headache of 7/10 in the left retro-orbital anterior temporal region. This resolved with IV Toradol. No other neurological symptoms were associated with this.  The following day the patient's  intra-arterial line was removed. The patient's Foley catheter was removed. The patient then sat in a chair and then ambulated without difficulty independently. He was able to eat a normal meal.  He was started on aspirin 81 mg per day and Plavix 75 mg a day. He was then discharged under care of his family to  continue his home meds in addition to the aspirin and Plavix. 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 The patient and the family leave with good understanding and agreement with the above management plan.  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   Electronically Signed   By: Luanne Bras M.D.   On: 08/13/2014 11:22   Ir 3d Primit[facility] Gauze Darreld Mclean  08/13/2014   CLINICAL DATA:  Unusual left-sided headaches. 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 Strong family history of ruptured and unruptured intracranial aneurysms.  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  MEDICATIONS: As per general anesthesia.  CONTRAST:  182m OMNIPAQUE IOHEXOL 300 MG/ML  SOLN  PROCEDURE: Following a full explanation of the procedure along with the potential associated complications, an informed witnessed consent was obtained. The  indications, the risks, the benefits and the alternatives had been discussed fully with the patient and the patient's family prior to the procedure.  The patient was put under general [facility] The right groin was prepped and draped in the usual sterile fashion. Thereafter using modified Seldinger technique, transfemoral access into the right common femoral artery was obtained without difficulty. Over a 0.035 inch guidewire, a 5 French Pinnacle sheath was inserted. Through this, and also over a 0.035 inch guidewire, a 5 French JB1 catheter was advanced to the aortic arch region and selectively positioned in the innominate artery, the right common carotid artery and left common carotid artery. The patient tolerated the procedure well.  COMPLICATIONS: None immediate.  FINDINGS: The right common carotid arteriogram demonstrates the right external carotid artery to be mildly narrowed at the level of the origin of the right facial artery. The remaining branches are seen to opacify normally.  The right internal carotid artery at the bulb demonstrates focal area of approximately 95% plus stenoses associated with a filling defect with improved caliber and antegrade flow into now a more normal appearing internal carotid artery to the cranial skull base. The petrous, the cavernous and the supraclinoid segments are widely patent. Opacification of the right middle cerebral artery is seen with mixing of non-opacified blood from the contralateral internal carotid artery via the anterior communicating artery. At the right MCA bifurcation there  is a suggestion of a saccular aneurysm projecting superiorly. Non visualization of the right anterior cerebral artery is seen because of the non-opacified blood.  The right vertebral artery origin is normal.  The vessel is seen to opacify normally to the cranial skull base.  Normal opacification is seen of the right vertebrobasilar  junction and the right posterior inferior cerebellar artery. The visualized basilar artery, the superior cerebellar arteries and the anterior inferior cerebellar arteries are grossly normal into the delayed arterial phase.  The left common carotid arteriogram demonstrates the left external carotid artery and its major branches to be normal.  The left internal carotid artery at the bulb to the cranial skull base is seen to opacify normally.  The petrous, cavernous and supraclinoid segments are widely patent.  The left middle and the left anterior cerebral artery opacify normally into the capillary and venous phases.  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  The proximal left middle cerebral artery trunk measures approximately 2.52 mm with the inferior division of the left middle cerebral artery measuring approximately 1.9 mm.  Opacification is also seen of the left anterior cerebral artery into the capillary and venous phases.  Arising in the proximal left anterior cerebral artery A1 segment, again seen is the saccular aneurysm projecting slightly superior and posteriorly measuring 2.7 mm x 1.6 mm.  Brisk simultaneous cross opacification via the anterior communicating artery of the right anterior cerebral artery A2 segment and the right anterior cerebral artery A1 segment is seen with flow into the right middle cerebral artery and the right MCA bifurcation. 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 Good aspiration was obtained from the side port of the Tuohy-Borst at the hub of the [place] shuttle sheath. A gentle constant injection demonstrated no evidence of spasms, dissections or of intraluminal filling defects.  This was then connected to continuous heparinized saline infusion.  Over the [employer] guidewire, a 6 French 105 cm Navien guide catheter was then advanced and positioned just proximal to the left common carotid bifurcation. The guidewire was removed. Good aspiration was obtained from the hub of the 6 [place] guide catheter. A gentle constant injection demonstrated no evidence of spasm, dissections or of intraluminal filling defects.  Using biplane roadmap technique and constant fluoroscopic guidance, over a 0.035 inch Roadrunner guidewire, the 6 [place] Navien guide catheter was then advanced to the horizontal petrous portion of the left internal carotid artery using biplane roadmap technique and constant fluoroscopic guidance. The guidewire was removed. Good aspiration was obtained from the hub of the 6 [place] Navien guide catheter.  A gentle constant injection demonstrated no evidence of spasms, dissections or of intraluminal filling defects.  A control arteriogram performed through the 6 [place] Navien guide catheter demonstrated no change in the intracranial circulation.  At this time a Scepter XC 4 mm x 11 mm balloon micro catheter was flushed with heparinized saline infusion in its wire port. The balloon port was then prepared by gently inflating the  balloon port with 50% contrast and 50% heparinized saline fusion until there was a drop of the mixture noted at the distal end of the tip of the dilated balloon. This was then submerged in 50% contrast and 50% heparinized saline infusion and rigorously aspirated to deflate the balloon without introducing any air.  Using biplane roadmap technique and constant fluoroscopic guidance, in a coaxial manner and with  constant heparinized saline infusion, the Scepter XC balloon micro catheter was then advanced over a 0.014 inch Softip Synchro micro guidewire with a J-tip configuration to the distal end of the 6 [place] Navien guide catheter.  With the micro guidewire leading with a J-tip configuration, and a torque device, the combination was navigated to the supraclinoid left ICA. The left MCA was then entered with the micro guidewire followed by the micro catheter. The inferior division of the left middle cerebral artery was then entered with the micro guidewire again without difficulty followed by the Scepter XC balloon micro catheter. The combination was navigated to the distal M3 segment of the inferior division of the left middle cerebral artery followed by the Scepter XC balloon micro catheter. The micro guidewire was then removed. Good aspiration was obtained from the hub of the Scepter XC micro catheter. A gentle constant injection demonstrated no evidence of spasm, dissections or of intraluminal filling defects.  This was then connected to continuous heparinized saline infusion.  Through a separate port on the double Tuohy Borst, a Headway 7, 2 tip micro catheter which had been steam shaped to a J-tip configuration, was then advanced over a 0.014 inch Synchro soft micro guidewire to the distal end of the 6 [place] Navien guide catheter which had now been positioned in the cavernous segment of the left internal carotid artery with the micro guidewire leading with a J-tip configuration to avoid dissections or inducing spasm. The combination was navigated to the supraclinoid left ICA. The left M1 segment was then entered with the micro guidewire followed by the micro catheter. The aneurysm was then entered without difficulty with the micro guidewire followed by the micro catheter. The micro guidewire was then gently retrieved under constant fluoroscopic guidance to ensure no sudden forward motion of the intra-aneurysmal  micro catheter tip. None was observed. Good aspiration was obtained from the hub of the intra-aneurysmal micro catheter. This was then connected to continuous heparinized saline infusion.  At this time a 3.5 mm x 23 mm LVIS JR stent device was then purged with heparinized saline infusion and its housing.  This was then advanced in a coaxial manner and with constant heparinized saline infusion using biplane roadmap technique and constant fluoroscopic guidance into the Scepter XC balloon micro catheter, and advanced to the distal end of the balloon micro catheter.  The markers on the stent were then identified. The entire system was then straightened. The stent was then positioned such that the proximal landing zone was in the proximal left middle cerebral artery.  The O ring on the delivery micro catheter and the delivery micro guidewire of stent were then released. With slight forward gentle traction with the right hand on the delivery micro guidewire, the Scepter XC balloon was then retrieved unsheathing the distal wire on the LVIS Jr stent. Using a gentle retrieval method with extension of the tension the LVIS JR stent was then gently unsheathed and deployed without difficulty with excellent coverage distal and proximal to the left MCA bifurcation aneurysm. The final position of the stent proximally was in the  mid M1 segment. Prior to retrieval of the delivery micro guidewire of the stent, a control arteriogram performed through the 6 [place] Navien guide catheter demonstrated excellent apposition and positioning of the LVIS JR stent across the neck of the aneurysm. Also patency was seen of the left MCA bifurcation branches. The tip of the intra-aneurysmal micro catheter remained stable within the aneurysm. Under constant fluoroscopic guidance, the delivery micro guidewire of the LVIS JR stent was then retrieved ensuring no entanglement with the stent struts. None was observed. This was then retrieved and removed.  A control arteriogram performed through the 6 [place] Navien guide catheter demonstrates safe positioning of the intra-aneurysmal micro catheter tip for embolization to begin.  The first coil utilized was a 3.5 mm x 8 cm Hypersoft 3D coil. This was advanced in a coaxial manner and with constant heparinized saline infusion using biplane roadmap technique and constant fluoroscopic guidance to the distal end of the intra-aneurysmal micro catheter. Using biplane roadmap technique, and constant fluoroscopic guidance, this was then deployed into the aneurysm with a nice basket configuration being achieved. Prior to detachment a control arteriogram was then performed through the 6 [place] Navien guide catheter. This confirmed safe positioning within the aneurysm with no coverage of the superior division of the left middle cerebral artery. This was then detached without difficulty. This was then followed by the placement of a 3 mm x 6 cm Hypersoft helical coil, a 2.5 mm x 4 cm Hypersoft 3D coil, and finally at 2 mm x 2 cm Hypersoft helical coil. Each of these coils was advanced into the aneurysm using biplane roadmap technique and constant fluoroscopic guidance, and deployed after a control arteriogram being performed.  At the end of the final coil, advancement of the next coil was met with resistance and herniation of micro catheter.  Arteriogram performed at 5 minutes after replacement of the final coil demonstrated slight filling defect along the origin of the superior division.  The intra-aneurysmal micro catheter was then gently retrieved under constant fluoroscopic guidance into the distal left M1 segment without difficulty. There was no change in the coil mass.  This prompted the use of 2 mg of superselective infusion of Integrelin with improved clearance at this point. Control arteriograms performed 15 and 25 minutes after the infusion of Integrelin through the 6 [place] Navien guide catheter demonstrated continued  obliteration of the aneurysm without change in the coil mass. No filling defects were seen within the [place] stent.  The micro catheter was then gently retrieved and removed ensuring no movement of the stent. None was observed. A final control arteriogram performed through the 6 [place] Navien guide catheter demonstrated complete obliteration of the aneurysm with wide patency of the stent. No other filling defects were seen in the left anterior circulation.  A final control arteriogram performed centered intracranially demonstrated brisk flow through the left anterior circulation involving the left middle cerebral artery and the left anterior cerebral artery into the capillary and venous phases. 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  The 6 [place] guide catheter and the 6 [place] Cook shuttle sheath were then retrieved into the abdominal aorta and exchanged over a J-tip guidewire for a 6 [place] Pinnacle sheath. This was then successfully removed with the application of an external closure device in the right common femoral arteriotomy site. Throughout the  procedure the patient's neurological and hemodynamic status remained stable. The patient's ACT was maintained in the region of approximately 180 seconds.  The patient's general anesthesia was then reversed and the patient was extubated. Upon recovery the patient demonstrated no new neurological signs or symptoms. The patient was then transported to the neuro ICU for overnight close neurological observations and to continue on IV heparin and to control the patient's blood pressure within the parameters prescribed.  Toward the end of the day, the patient was able to eat clear liquids without difficulty. He did not have any neurological complaints of nausea, vomiting or headaches.   Overnight the patient did have an episode of a headache of 7/10 in the left retro-orbital anterior temporal region. This resolved with IV Toradol. No other neurological symptoms were associated with this.  The following day the patient's intra-arterial line was removed. The patient's Foley catheter was removed. The patient then sat in a chair and then ambulated without difficulty independently. He was able to eat a normal meal.  He was started on aspirin 81 mg per day and Plavix 75 mg a day. He was then discharged under care of his family to continue his home meds in addition to the aspirin and Plavix. 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 The patient and the family leave with good understanding and agreement with the above management plan.  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   Electronically Signed   By: Luanne Bras M.D.   On: 08/13/2014 11:22   Dg Chest Port 1 View  08/11/2014   CLINICAL DATA:  Nonproductive cough.  EXAM: PORTABLE CHEST - 1 VIEW  COMPARISON:  07/07/2014  FINDINGS: The heart size and mediastinal contours are within normal limits. Both lungs are clear. The visualized skeletal structures are unremarkable.  IMPRESSION: No significant abnormalities.    Electronically Signed   By: Rozetta Nunnery M.D.   On: 08/11/2014 06:26   Ir Angio Intra Extracran Sel Com Carotid Innominate Uni R Mod Sed  08/13/2014   CLINICAL DATA:  Unusual left-sided headaches. 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 Strong family history of ruptured and unruptured intracranial aneurysms.  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  MEDICATIONS: As per general anesthesia.  CONTRAST:  161m OMNIPAQUE IOHEXOL 300 MG/ML  SOLN  PROCEDURE: Following a full explanation of the procedure along with the potential associated complications, an informed witnessed consent was obtained. The indications, the risks, the benefits and the alternatives had been discussed fully with the patient and the patient's family prior to the procedure.  The patient was  put under general anesthesia by [facility]. The right groin was prepped and draped in the usual sterile fashion. Thereafter using modified Seldinger technique, transfemoral access into the right common femoral artery was obtained without difficulty. Over a 0.035 inch guidewire, a 5 French Pinnacle sheath was inserted. Through this, and also over a 0.035 inch guidewire, a 5 French JB1 catheter was advanced to the aortic arch region and selectively positioned in the innominate artery, the right common carotid artery and left common carotid artery. The patient tolerated the procedure well.  COMPLICATIONS: None immediate.  FINDINGS: The right common carotid arteriogram demonstrates the right external carotid artery to be mildly  narrowed at the level of the origin of the right facial artery. The remaining branches are seen to opacify normally.  The right internal carotid artery at the bulb demonstrates focal area of approximately 95% plus stenoses associated with a filling defect with improved caliber and antegrade flow into now a more normal appearing internal carotid artery to the cranial skull base. The petrous, the cavernous and the supraclinoid segments are widely patent. Opacification of the right middle cerebral artery is seen with mixing of non-opacified blood from the contralateral internal carotid artery via the anterior communicating artery. At the right MCA bifurcation there is a suggestion of a saccular aneurysm projecting superiorly. Non visualization of the right anterior cerebral artery is seen because of the non-opacified blood.  The right vertebral artery origin is normal.  The vessel is seen to opacify normally to the cranial skull base.  Normal opacification is seen of the right vertebrobasilar junction and the right posterior inferior cerebellar artery. The visualized basilar artery, the superior cerebellar arteries and the anterior inferior cerebellar arteries are grossly normal into the delayed arterial phase.  The left common carotid arteriogram demonstrates the left external carotid artery and its major branches to be normal.  The left internal carotid artery at the bulb to the cranial skull base is seen to opacify normally.  The petrous, cavernous and supraclinoid segments are widely patent.  The left middle and the left anterior cerebral artery opacify normally into the capillary and venous phases.  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  The proximal left middle cerebral artery trunk measures approximately 2.52 mm with the inferior division of the left middle cerebral artery measuring approximately 1.9 mm.   Opacification is also seen of the left anterior cerebral artery into the capillary and venous phases.  Arising in the proximal left anterior cerebral artery A1 segment, again seen is the saccular aneurysm projecting slightly superior and posteriorly measuring 2.7 mm x 1.6 mm.  Brisk simultaneous cross opacification via the anterior communicating artery of the right anterior cerebral artery A2 segment and the right anterior cerebral artery A1 segment is seen with flow into the right middle cerebral artery and the right MCA bifurcation. 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 Good aspiration was obtained from the side port of the Tuohy-Borst at the hub  of the [place] shuttle sheath. A gentle constant injection demonstrated no evidence of spasms, dissections or of intraluminal filling defects.  This was then connected to continuous heparinized saline infusion.  Over the [employer] guidewire, a 6 French 105 cm Navien guide catheter was then advanced and positioned just proximal to the left common carotid bifurcation. The guidewire was removed. Good aspiration was obtained from the hub of the 6 [place] guide catheter. A gentle constant injection demonstrated no evidence of spasm, dissections or of intraluminal filling defects.  Using biplane roadmap technique and constant fluoroscopic guidance, over a 0.035 inch Roadrunner guidewire, the 6 [place] Navien guide catheter was then advanced to the horizontal petrous portion of the left internal carotid artery using biplane  roadmap technique and constant fluoroscopic guidance. The guidewire was removed. Good aspiration was obtained from the hub of the 6 [place] Navien guide catheter.  A gentle constant injection demonstrated no evidence of spasms, dissections or of intraluminal filling defects.  A control arteriogram performed through the 6 [place] Navien guide catheter demonstrated no change in the intracranial circulation.  At this time a Scepter XC 4 mm x 11 mm balloon micro catheter was flushed with heparinized saline infusion in its wire port. The balloon port was then prepared by gently inflating the balloon port with 50% contrast and 50% heparinized saline fusion until there was a drop of the mixture noted at the distal end of the tip of the dilated balloon. This was then submerged in 50% contrast and 50% heparinized saline infusion and rigorously aspirated to deflate the balloon without introducing any air.  Using biplane roadmap technique and constant fluoroscopic guidance, in a coaxial manner and with constant heparinized saline infusion, the Scepter XC balloon micro catheter was then advanced over a 0.014 inch Softip Synchro micro guidewire with a J-tip configuration to the distal end of the 6 [place] Navien guide catheter.  With the micro guidewire leading with a J-tip configuration, and a torque device, the combination was navigated to the supraclinoid left ICA. The left MCA was then entered with the micro guidewire followed by the micro catheter. The inferior division of the left middle cerebral artery was then entered with the micro guidewire again without difficulty followed by the Scepter XC balloon micro catheter. The combination was navigated to the distal M3 segment of the inferior division of the left middle cerebral artery followed by the Scepter XC balloon micro catheter. The micro guidewire was then removed. Good aspiration was obtained from the hub of the Scepter XC micro catheter. A gentle constant injection  demonstrated no evidence of spasm, dissections or of intraluminal filling defects.  This was then connected to continuous heparinized saline infusion.  Through a separate port on the double Tuohy Borst, a Headway 7, 2 tip micro catheter which had been steam shaped to a J-tip configuration, was then advanced over a 0.014 inch Synchro soft micro guidewire to the distal end of the 6 [place] Navien guide catheter which had now been positioned in the cavernous segment of the left internal carotid artery with the micro guidewire leading with a J-tip configuration to avoid dissections or inducing spasm. The combination was navigated to the supraclinoid left ICA. The left M1 segment was then entered with the micro guidewire followed by the micro catheter. The aneurysm was then entered without difficulty with the micro guidewire followed by the micro catheter. The micro guidewire was then gently retrieved under constant fluoroscopic guidance to ensure no sudden forward motion  of the intra-aneurysmal micro catheter tip. None was observed. Good aspiration was obtained from the hub of the intra-aneurysmal micro catheter. This was then connected to continuous heparinized saline infusion.  At this time a 3.5 mm x 23 mm LVIS JR stent device was then purged with heparinized saline infusion and its housing.  This was then advanced in a coaxial manner and with constant heparinized saline infusion using biplane roadmap technique and constant fluoroscopic guidance into the Scepter XC balloon micro catheter, and advanced to the distal end of the balloon micro catheter.  The markers on the stent were then identified. The entire system was then straightened. The stent was then positioned such that the proximal landing zone was in the proximal left middle cerebral artery.  The O ring on the delivery micro catheter and the delivery micro guidewire of stent were then released. With slight forward gentle traction with the right hand on the  delivery micro guidewire, the Scepter XC balloon was then retrieved unsheathing the distal wire on the LVIS Jr stent. Using a gentle retrieval method with extension of the tension the LVIS JR stent was then gently unsheathed and deployed without difficulty with excellent coverage distal and proximal to the left MCA bifurcation aneurysm. The final position of the stent proximally was in the mid M1 segment. Prior to retrieval of the delivery micro guidewire of the stent, a control arteriogram performed through the 6 [place] Navien guide catheter demonstrated excellent apposition and positioning of the LVIS JR stent across the neck of the aneurysm. Also patency was seen of the left MCA bifurcation branches. The tip of the intra-aneurysmal micro catheter remained stable within the aneurysm. Under constant fluoroscopic guidance, the delivery micro guidewire of the LVIS JR stent was then retrieved ensuring no entanglement with the stent struts. None was observed. This was then retrieved and removed. A control arteriogram performed through the 6 [place] Navien guide catheter demonstrates safe positioning of the intra-aneurysmal micro catheter tip for embolization to begin.  The first coil utilized was a 3.5 mm x 8 cm Hypersoft 3D coil. This was advanced in a coaxial manner and with constant heparinized saline infusion using biplane roadmap technique and constant fluoroscopic guidance to the distal end of the intra-aneurysmal micro catheter. Using biplane roadmap technique, and constant fluoroscopic guidance, this was then deployed into the aneurysm with a nice basket configuration being achieved. Prior to detachment a control arteriogram was then performed through the 6 [place] Navien guide catheter. This confirmed safe positioning within the aneurysm with no coverage of the superior division of the left middle cerebral artery. This was then detached without difficulty. This was then followed by the placement of a 3 mm x 6 cm  Hypersoft helical coil, a 2.5 mm x 4 cm Hypersoft 3D coil, and finally at 2 mm x 2 cm Hypersoft helical coil. Each of these coils was advanced into the aneurysm using biplane roadmap technique and constant fluoroscopic guidance, and deployed after a control arteriogram being performed.  At the end of the final coil, advancement of the next coil was met with resistance and herniation of micro catheter.  Arteriogram performed at 5 minutes after replacement of the final coil demonstrated slight filling defect along the origin of the superior division.  The intra-aneurysmal micro catheter was then gently retrieved under constant fluoroscopic guidance into the distal left M1 segment without difficulty. There was no change in the coil mass.  This prompted the use of 2 mg of superselective infusion of  Integrelin with improved clearance at this point. Control arteriograms performed 15 and 25 minutes after the infusion of Integrelin through the 6 [place] Navien guide catheter demonstrated continued obliteration of the aneurysm without change in the coil mass. No filling defects were seen within the [place] stent.  The micro catheter was then gently retrieved and removed ensuring no movement of the stent. None was observed. A final control arteriogram performed through the 6 [place] Navien guide catheter demonstrated complete obliteration of the aneurysm with wide patency of the stent. No other filling defects were seen in the left anterior circulation.  A final control arteriogram performed centered intracranially demonstrated brisk flow through the left anterior circulation involving the left middle cerebral artery and the left anterior cerebral artery into the capillary and venous phases. 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  The 6 [place]  guide catheter and the 6 [place] Cook shuttle sheath were then retrieved into the abdominal aorta and exchanged over a J-tip guidewire for a 6 [place] Pinnacle sheath. This was then successfully removed with the application of an external closure device in the right common femoral arteriotomy site. Throughout the procedure the patient's neurological and hemodynamic status remained stable. The patient's ACT was maintained in the region of approximately 180 seconds.  The patient's general anesthesia was then reversed and the patient was extubated. Upon recovery the patient demonstrated no new neurological signs or symptoms. The patient was then transported to the neuro ICU for overnight close neurological observations and to continue on IV heparin and to control the patient's blood pressure within the parameters prescribed.  Toward the end of the day, the patient was able to eat clear liquids without difficulty. He did not have any neurological complaints of nausea, vomiting or headaches.  Overnight the patient did have an episode of a headache of 7/10 in the left retro-orbital anterior temporal region. This resolved with IV Toradol. No other neurological symptoms were associated with this.  The following day the patient's intra-arterial line was removed. The patient's Foley catheter was removed. The patient then sat in a chair and then ambulated without difficulty independently. He was able to eat a normal meal.  He was started on aspirin 81 mg per day and Plavix 75 mg a day. He was then discharged under care of his family to continue his home meds in addition to the aspirin and Plavix. 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 Also the management of the right middle  cerebral artery region aneurysm will be addressed at that time. The patient and the family leave with good understanding and agreement with the above management plan.  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   Electronically Signed   By: Luanne Bras M.D.   On: 08/13/2014 11:22   Ir Angio Intra Extracran Sel  Internal Carotid Uni L Mod Sed  08/13/2014   CLINICAL DATA:  Unusual left-sided headaches. 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 Strong family history of ruptured and unruptured intracranial aneurysms.  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  MEDICATIONS: As per general anesthesia.  CONTRAST:  133m OMNIPAQUE IOHEXOL 300 MG/ML  SOLN  PROCEDURE: Following a full explanation of the procedure along with the potential associated complications, an informed witnessed consent was obtained. The indications, the risks, the benefits and the alternatives had been discussed fully with the patient and the patient's family prior to the procedure.  The patient was put under general [facility] The right groin was prepped and draped in the usual sterile fashion. Thereafter using modified Seldinger  technique, transfemoral access into the right common femoral artery was obtained without difficulty. Over a 0.035 inch guidewire, a 5 French Pinnacle sheath was inserted. Through this, and also over a 0.035 inch guidewire, a 5 French JB1 catheter was advanced to the aortic arch region and selectively positioned in the innominate artery, the right common carotid artery and left common carotid artery. The patient tolerated the procedure well.  COMPLICATIONS: None immediate.  FINDINGS: The right common carotid arteriogram demonstrates the right external carotid artery to be mildly narrowed at the level of the origin of the right facial artery. The remaining branches are seen to opacify normally.  The right internal carotid artery at the bulb demonstrates focal area of approximately 95% plus stenoses associated with a filling defect with improved caliber and antegrade flow into now a more normal appearing internal carotid artery to the cranial skull base. The petrous, the cavernous and the supraclinoid segments are widely patent. Opacification of the right middle cerebral artery is seen with mixing of non-opacified blood from the contralateral internal carotid artery via the anterior communicating artery. At the right MCA bifurcation there is a suggestion of a saccular aneurysm projecting superiorly. Non visualization of the right anterior cerebral artery is seen because of the non-opacified blood.  The right vertebral artery origin is normal.  The vessel is seen to opacify normally to the cranial skull base.  Normal opacification is seen of the right vertebrobasilar junction and the right posterior inferior cerebellar artery. The visualized basilar artery, the superior cerebellar arteries and the anterior inferior cerebellar arteries are grossly normal into the delayed arterial phase.  The left common carotid arteriogram demonstrates the left external carotid artery and its major branches to be normal.  The left  internal carotid artery at the bulb to the cranial skull base is seen to opacify normally.  The petrous, cavernous and supraclinoid segments are widely patent.  The left middle and the left anterior cerebral artery opacify normally into the capillary and venous phases.  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  The proximal left middle cerebral artery trunk measures approximately 2.52 mm with the inferior division of the left middle cerebral artery measuring approximately 1.9 mm.  Opacification is also seen of the left anterior cerebral artery into the capillary and venous phases.  Arising in the proximal left anterior cerebral  artery A1 segment, again seen is the saccular aneurysm projecting slightly superior and posteriorly measuring 2.7 mm x 1.6 mm.  Brisk simultaneous cross opacification via the anterior communicating artery of the right anterior cerebral artery A2 segment and the right anterior cerebral artery A1 segment is seen with flow into the right middle cerebral artery and the right MCA bifurcation. 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 Good aspiration was obtained from the side port of the Tuohy-Borst at the hub of the [place] shuttle sheath. A gentle constant injection demonstrated no evidence of spasms, dissections or of intraluminal filling defects.  This was then connected to continuous heparinized saline infusion.  Over the [employer] guidewire, a 6  French 105 cm Navien guide catheter was then advanced and positioned just proximal to the left common carotid bifurcation. The guidewire was removed. Good aspiration was obtained from the hub of the 6 [place] guide catheter. A gentle constant injection demonstrated no evidence of spasm, dissections or of intraluminal filling defects.  Using biplane roadmap technique and constant fluoroscopic guidance, over a 0.035 inch Roadrunner guidewire, the 6 [place] Navien guide catheter was then advanced to the horizontal petrous portion of the left internal carotid artery using biplane roadmap technique and constant fluoroscopic guidance. The guidewire was removed. Good aspiration was obtained from the hub of the 6 [place] Navien guide catheter.  A gentle constant injection demonstrated no evidence of spasms, dissections or of intraluminal filling defects.  A control arteriogram performed through the 6 [place] Navien guide catheter demonstrated no change in the intracranial circulation.  At this time a Scepter XC 4 mm x 11 mm balloon micro catheter was flushed with heparinized saline infusion in its wire port. The balloon port was then prepared by gently inflating the balloon port with 50% contrast and 50% heparinized saline fusion until there was a drop of the mixture noted at the distal end of the tip of the dilated balloon. This was then submerged in 50% contrast and 50% heparinized saline infusion and rigorously aspirated to deflate the balloon without introducing any air.  Using biplane roadmap technique and constant fluoroscopic guidance, in a coaxial manner and with constant heparinized saline infusion, the Scepter XC balloon micro catheter was then advanced over a 0.014 inch Softip Synchro micro guidewire with a J-tip configuration to the distal end of the 6 [place] Navien guide catheter.  With the micro guidewire leading with a J-tip configuration, and a torque device, the combination was navigated to the supraclinoid  left ICA. The left MCA was then entered with the micro guidewire followed by the micro catheter. The inferior division of the left middle cerebral artery was then entered with the micro guidewire again without difficulty followed by the Scepter XC balloon micro catheter. The combination was navigated to the distal M3 segment of the inferior division of the left middle cerebral artery followed by the Scepter XC balloon micro catheter. The micro guidewire was then removed. Good aspiration was obtained from the hub of the Scepter XC micro catheter. A gentle constant injection demonstrated no evidence of spasm, dissections  or of intraluminal filling defects.  This was then connected to continuous heparinized saline infusion.  Through a separate port on the double Tuohy Borst, a Headway 7, 2 tip micro catheter which had been steam shaped to a J-tip configuration, was then advanced over a 0.014 inch Synchro soft micro guidewire to the distal end of the 6 [place] Navien guide catheter which had now been positioned in the cavernous segment of the left internal carotid artery with the micro guidewire leading with a J-tip configuration to avoid dissections or inducing spasm. The combination was navigated to the supraclinoid left ICA. The left M1 segment was then entered with the micro guidewire followed by the micro catheter. The aneurysm was then entered without difficulty with the micro guidewire followed by the micro catheter. The micro guidewire was then gently retrieved under constant fluoroscopic guidance to ensure no sudden forward motion of the intra-aneurysmal micro catheter tip. None was observed. Good aspiration was obtained from the hub of the intra-aneurysmal micro catheter. This was then connected to continuous heparinized saline infusion.  At this time a 3.5 mm x 23 mm LVIS JR stent device was then purged with heparinized saline infusion and its housing.  This was then advanced in a coaxial manner and with  constant heparinized saline infusion using biplane roadmap technique and constant fluoroscopic guidance into the Scepter XC balloon micro catheter, and advanced to the distal end of the balloon micro catheter.  The markers on the stent were then identified. The entire system was then straightened. The stent was then positioned such that the proximal landing zone was in the proximal left middle cerebral artery.  The O ring on the delivery micro catheter and the delivery micro guidewire of stent were then released. With slight forward gentle traction with the right hand on the delivery micro guidewire, the Scepter XC balloon was then retrieved unsheathing the distal wire on the LVIS Jr stent. Using a gentle retrieval method with extension of the tension the LVIS JR stent was then gently unsheathed and deployed without difficulty with excellent coverage distal and proximal to the left MCA bifurcation aneurysm. The final position of the stent proximally was in the mid M1 segment. Prior to retrieval of the delivery micro guidewire of the stent, a control arteriogram performed through the 6 [place] Navien guide catheter demonstrated excellent apposition and positioning of the LVIS JR stent across the neck of the aneurysm. Also patency was seen of the left MCA bifurcation branches. The tip of the intra-aneurysmal micro catheter remained stable within the aneurysm. Under constant fluoroscopic guidance, the delivery micro guidewire of the LVIS JR stent was then retrieved ensuring no entanglement with the stent struts. None was observed. This was then retrieved and removed. A control arteriogram performed through the 6 [place] Navien guide catheter demonstrates safe positioning of the intra-aneurysmal micro catheter tip for embolization to begin.  The first coil utilized was a 3.5 mm x 8 cm Hypersoft 3D coil. This was advanced in a coaxial manner and with constant heparinized saline infusion using biplane roadmap technique and  constant fluoroscopic guidance to the distal end of the intra-aneurysmal micro catheter. Using biplane roadmap technique, and constant fluoroscopic guidance, this was then deployed into the aneurysm with a nice basket configuration being achieved. Prior to detachment a control arteriogram was then performed through the 6 [place] Navien guide catheter. This confirmed safe positioning within the aneurysm with no coverage of the superior division of the left middle cerebral artery. This was then  detached without difficulty. This was then followed by the placement of a 3 mm x 6 cm Hypersoft helical coil, a 2.5 mm x 4 cm Hypersoft 3D coil, and finally at 2 mm x 2 cm Hypersoft helical coil. Each of these coils was advanced into the aneurysm using biplane roadmap technique and constant fluoroscopic guidance, and deployed after a control arteriogram being performed.  At the end of the final coil, advancement of the next coil was met with resistance and herniation of micro catheter.  Arteriogram performed at 5 minutes after replacement of the final coil demonstrated slight filling defect along the origin of the superior division.  The intra-aneurysmal micro catheter was then gently retrieved under constant fluoroscopic guidance into the distal left M1 segment without difficulty. There was no change in the coil mass.  This prompted the use of 2 mg of superselective infusion of Integrelin with improved clearance at this point. Control arteriograms performed 15 and 25 minutes after the infusion of Integrelin through the 6 [place] Navien guide catheter demonstrated continued obliteration of the aneurysm without change in the coil mass. No filling defects were seen within the [place] stent.  The micro catheter was then gently retrieved and removed ensuring no movement of the stent. None was observed. A final control arteriogram performed through the 6 [place] Navien guide catheter demonstrated complete obliteration of the aneurysm  with wide patency of the stent. No other filling defects were seen in the left anterior circulation.  A final control arteriogram performed centered intracranially demonstrated brisk flow through the left anterior circulation involving the left middle cerebral artery and the left anterior cerebral artery into the capillary and venous phases. 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  The 6 [place] guide catheter and the 6 [place] Cook shuttle sheath were then retrieved into the abdominal aorta and exchanged over a J-tip guidewire for a 6 [place] Pinnacle sheath. This was then successfully removed with the application of an external closure device in the right common femoral arteriotomy site. Throughout the procedure the patient's neurological and hemodynamic status remained stable. The patient's ACT was maintained in the region of approximately 180 seconds.  The patient's general anesthesia was then reversed and the patient was extubated. Upon recovery the patient demonstrated no new neurological signs or symptoms. The patient was then transported to the neuro ICU for overnight close neurological observations and to continue on IV heparin and to control the patient's blood pressure within the parameters prescribed.  Toward the end of the day, the patient was able to eat clear liquids without difficulty. He did not have any neurological complaints of nausea, vomiting or headaches.  Overnight the patient did have an episode of a headache of 7/10 in the left retro-orbital anterior temporal region. This resolved with IV Toradol. No other neurological symptoms were associated with this.  The following day the patient's intra-arterial line was removed. The patient's Foley catheter was removed. The patient then sat in a chair and then ambulated  without difficulty independently. He was able to eat a normal meal.  He was started on aspirin 81 mg per day and Plavix 75 mg a day. He was then discharged under care of his family to continue his home meds in addition to the aspirin and Plavix. 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 The patient and the family leave with good understanding and agreement with the above management plan.  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   Electronically Signed   By: Luanne Bras M.D.   On: 08/13/2014 11:22   Ir Neuro Each Add'l After Basic Uni Left (ms)  08/13/2014   CLINICAL DATA:  Unusual left-sided headaches. 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 Strong family history of ruptured and unruptured intracranial aneurysms.  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  MEDICATIONS: As per general anesthesia.   CONTRAST:  140m OMNIPAQUE IOHEXOL 300 MG/ML  SOLN  PROCEDURE: Following a full explanation of the procedure along with the potential associated complications, an informed witnessed consent was obtained. The indications, the risks, the benefits and the alternatives had been discussed fully with the patient and the patient's family prior to the procedure.  The patient was put under general anesthesia by The Department of Anesthesiology at [facility] The right groin was prepped and draped in the usual sterile fashion. Thereafter using modified Seldinger technique, transfemoral access into the right common femoral artery was obtained without difficulty. Over a 0.035 inch guidewire, a 5 French Pinnacle sheath was inserted. Through this, and also over a 0.035 inch guidewire, a 5 French JB1 catheter was advanced to the aortic arch region and selectively positioned in the innominate artery, the right common carotid artery and left common carotid artery. The patient tolerated the procedure well.  COMPLICATIONS: None immediate.  FINDINGS: The right common carotid arteriogram demonstrates the right external carotid artery to be mildly narrowed at the level of the origin of the right facial artery. The remaining branches are seen to opacify normally.  The right internal carotid artery at the bulb demonstrates focal area of approximately 95% plus stenoses associated with a filling defect with improved caliber and antegrade flow into now a more normal appearing internal carotid artery to the cranial skull base. The petrous, the cavernous and the supraclinoid segments are widely patent. Opacification of the right middle cerebral artery is seen with mixing of non-opacified blood from the contralateral internal carotid artery via the anterior communicating artery. At the right MCA bifurcation there is a suggestion of a saccular aneurysm projecting superiorly. Non visualization of the right anterior cerebral artery is seen  because of the non-opacified blood.  The right vertebral artery origin is normal.  The vessel is seen to opacify normally to the cranial skull base.  Normal opacification is seen of the right vertebrobasilar junction and the right posterior inferior cerebellar artery. The visualized basilar artery, the superior cerebellar arteries and the anterior inferior cerebellar arteries  are grossly normal into the delayed arterial phase.  The left common carotid arteriogram demonstrates the left external carotid artery and its major branches to be normal.  The left internal carotid artery at the bulb to the cranial skull base is seen to opacify normally.  The petrous, cavernous and supraclinoid segments are widely patent.  The left middle and the left anterior cerebral artery opacify normally into the capillary and venous phases.  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  The proximal left middle cerebral artery trunk measures approximately 2.52 mm with the inferior division of the left middle cerebral artery measuring approximately 1.9 mm.  Opacification is also seen of the left anterior cerebral artery into the capillary and venous phases.  Arising in the proximal left anterior cerebral artery A1 segment, again seen is the saccular aneurysm projecting slightly superior and posteriorly measuring 2.7 mm x 1.6 mm.  Brisk simultaneous cross opacification via the anterior communicating artery of the right anterior cerebral artery A2 segment and the right anterior cerebral artery A1 segment is seen with flow into the right middle cerebral artery and the right MCA bifurcation. 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 Good aspiration was obtained from the side port of the Tuohy-Borst at the hub of the [place] shuttle sheath. A gentle constant injection demonstrated no evidence of spasms, dissections or of intraluminal filling defects.  This was then connected to continuous heparinized saline infusion.  Over the [employer] guidewire, a 6 French 105 cm Navien guide catheter was then advanced and positioned just proximal to the left common carotid bifurcation. The guidewire was removed. Good aspiration was obtained from the hub of the 6 [place] guide catheter. A gentle constant injection demonstrated no evidence of spasm, dissections or of intraluminal filling defects.  Using biplane roadmap technique and constant fluoroscopic guidance, over a 0.035 inch [place] guidewire, the 6 [place] Navien guide catheter was then advanced to the horizontal petrous portion of the left internal carotid artery using biplane roadmap technique and constant fluoroscopic guidance. The guidewire was removed. Good aspiration was obtained from the hub of the 6 [place] Navien guide catheter.  A gentle constant injection demonstrated no evidence of spasms, dissections or of intraluminal filling defects.  A control arteriogram performed through the 6 [place] Navien guide catheter demonstrated no change in the intracranial circulation.  At this time a Scepter XC 4 mm x 11 mm balloon micro catheter was flushed with heparinized saline infusion in its wire port. The balloon port was then prepared by gently inflating the balloon port with 50% contrast and 50% heparinized saline fusion until there was a drop of the mixture noted at the distal end of the tip of the dilated balloon. This was then submerged in 50% contrast and 50%  heparinized saline infusion and rigorously aspirated to deflate the balloon without introducing any air.  Using biplane roadmap technique and constant fluoroscopic guidance, in a coaxial manner and with constant heparinized saline infusion, the Scepter XC balloon  micro catheter was then advanced over a 0.014 inch Softip Synchro micro guidewire with a J-tip configuration to the distal end of the 6 [place] Navien guide catheter.  With the micro guidewire leading with a J-tip configuration, and a torque device, the combination was navigated to the supraclinoid left ICA. The left MCA was then entered with the micro guidewire followed by the micro catheter. The inferior division of the left middle cerebral artery was then entered with the micro guidewire again without difficulty followed by the Scepter XC balloon micro catheter. The combination was navigated to the distal M3 segment of the inferior division of the left middle cerebral artery followed by the Scepter XC balloon micro catheter. The micro guidewire was then removed. Good aspiration was obtained from the hub of the Scepter XC micro catheter. A gentle constant injection demonstrated no evidence of spasm, dissections or of intraluminal filling defects.  This was then connected to continuous heparinized saline infusion.  Through a separate port on the double Tuohy Borst, a Headway 7, 2 tip micro catheter which had been steam shaped to a J-tip configuration, was then advanced over a 0.014 inch Synchro soft micro guidewire to the distal end of the 6 [place] Navien guide catheter which had now been positioned in the cavernous segment of the left internal carotid artery with the micro guidewire leading with a J-tip configuration to avoid dissections or inducing spasm. The combination was navigated to the supraclinoid left ICA. The left M1 segment was then entered with the micro guidewire followed by the micro catheter. The aneurysm was then entered without difficulty  with the micro guidewire followed by the micro catheter. The micro guidewire was then gently retrieved under constant fluoroscopic guidance to ensure no sudden forward motion of the intra-aneurysmal micro catheter tip. None was observed. Good aspiration was obtained from the hub of the intra-aneurysmal micro catheter. This was then connected to continuous heparinized saline infusion.  At this time a 3.5 mm x 23 mm LVIS JR stent device was then purged with heparinized saline infusion and its housing.  This was then advanced in a coaxial manner and with constant heparinized saline infusion using biplane roadmap technique and constant fluoroscopic guidance into the Scepter XC balloon micro catheter, and advanced to the distal end of the balloon micro catheter.  The markers on the stent were then identified. The entire system was then straightened. The stent was then positioned such that the proximal landing zone was in the proximal left middle cerebral artery.  The O ring on the delivery micro catheter and the delivery micro guidewire of stent were then released. With slight forward gentle traction with the right hand on the delivery micro guidewire, the Scepter XC balloon was then retrieved unsheathing the distal wire on the LVIS Jr stent. Using a gentle retrieval method with extension of the tension the LVIS JR stent was then gently unsheathed and deployed without difficulty with excellent coverage distal and proximal to the left MCA bifurcation aneurysm. The final position of the stent proximally was in the mid M1 segment. Prior to retrieval of the delivery micro guidewire of the stent, a control arteriogram performed through the 6 [place] Navien guide catheter demonstrated excellent apposition and positioning of the LVIS JR stent across the neck of the aneurysm. Also patency was seen of the left MCA bifurcation branches. The tip of the intra-aneurysmal micro catheter remained stable within the aneurysm. Under  constant fluoroscopic guidance, the delivery micro guidewire of the [place] stent  was then retrieved ensuring no entanglement with the stent struts. None was observed. This was then retrieved and removed. A control arteriogram performed through the 6 [place] Navien guide catheter demonstrates safe positioning of the intra-aneurysmal micro catheter tip for embolization to begin.  The first coil utilized was a 3.5 mm x 8 cm Hypersoft 3D coil. This was advanced in a coaxial manner and with constant heparinized saline infusion using biplane roadmap technique and constant fluoroscopic guidance to the distal end of the intra-aneurysmal micro catheter. Using biplane roadmap technique, and constant fluoroscopic guidance, this was then deployed into the aneurysm with a nice basket configuration being achieved. Prior to detachment a control arteriogram was then performed through the 6 [place] Navien guide catheter. This confirmed safe positioning within the aneurysm with no coverage of the superior division of the left middle cerebral artery. This was then detached without difficulty. This was then followed by the placement of a 3 mm x 6 cm Hypersoft helical coil, a 2.5 mm x 4 cm Hypersoft 3D coil, and finally at 2 mm x 2 cm Hypersoft helical coil. Each of these coils was advanced into the aneurysm using biplane roadmap technique and constant fluoroscopic guidance, and deployed after a control arteriogram being performed.  At the end of the final coil, advancement of the next coil was met with resistance and herniation of micro catheter.  Arteriogram performed at 5 minutes after replacement of the final coil demonstrated slight filling defect along the origin of the superior division.  The intra-aneurysmal micro catheter was then gently retrieved under constant fluoroscopic guidance into the distal left M1 segment without difficulty. There was no change in the coil mass.  This prompted the use of 2 mg of superselective infusion  of Integrelin with improved clearance at this point. Control arteriograms performed 15 and 25 minutes after the infusion of Integrelin through the 6 [place] Navien guide catheter demonstrated continued obliteration of the aneurysm without change in the coil mass. No filling defects were seen within the [place] stent.  The micro catheter was then gently retrieved and removed ensuring no movement of the stent. None was observed. A final control arteriogram performed through the 6 [place] Navien guide catheter demonstrated complete obliteration of the aneurysm with wide patency of the stent. No other filling defects were seen in the left anterior circulation.  A final control arteriogram performed centered intracranially demonstrated brisk flow through the left anterior circulation involving the left middle cerebral artery and the left anterior cerebral artery into the capillary and venous phases. 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  The 6 [place] guide catheter and the 6 [place] Cook shuttle sheath were then retrieved into the abdominal aorta and exchanged over a J-tip guidewire for a 6 [place] Pinnacle sheath. This was then successfully removed with the application of an external closure device in the right common femoral arteriotomy site. Throughout the procedure the patient's neurological and hemodynamic status remained stable. The patient's ACT was maintained in the region of approximately 180 seconds.  The patient's general anesthesia was then reversed and the patient was extubated. Upon recovery the patient demonstrated no new neurological signs or symptoms. The patient was then transported to the neuro ICU for overnight close neurological observations and to continue on IV heparin and to control the patient's blood pressure within  the parameters prescribed.  Toward the end of the day, the patient was able to eat clear liquids without difficulty. He did not have any neurological complaints of nausea, vomiting or headaches.  Overnight the patient did have an episode of a headache of 7/10 in the left retro-orbital anterior temporal region. This resolved with IV Toradol. No other neurological symptoms were associated with this.  The following day the patient's intra-arterial line was removed. The patient's Foley catheter was removed. The patient then sat in a chair and then ambulated without difficulty independently. He was able to eat a normal meal.  He was started on aspirin 81 mg per day and Plavix 75 mg a day. He was then discharged under care of his family to continue his home meds in addition to the aspirin and Plavix. 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 The patient and the family leave with good understanding and agreement with the above management plan.  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   Electronically Signed   By: Luanne Bras M.D.   On: 08/13/2014 11:22    Microbiology: No results found for this or any previous visit (from the past 240 hour(s)).   Labs: Basic Metabolic Panel:  Recent  Labs Lab 08/09/14 0510 08/11/14 0409  NA 139 138  K 4.0 3.9  CL 103 102  CO2 25 25  GLUCOSE 106* 104*  BUN 9 10  CREATININE 0.82 0.75  CALCIUM 8.8 9.0   Liver Function Tests: No results for input(s): AST, ALT, ALKPHOS, BILITOT, PROT, ALBUMIN in the last 168 hours. No results for input(s): LIPASE, AMYLASE in the last 168 hours. No results for input(s): AMMONIA in the last 168 hours. CBC:  Recent Labs Lab 08/09/14 0510 08/11/14 0409  WBC 8.2 6.2  NEUTROABS 5.1 2.8  HGB 12.0* 12.0*  HCT 35.4* 35.1*  MCV 87.0 86.7  PLT 176 177   Cardiac Enzymes: No results for input(s): CKTOTAL, CKMB, CKMBINDEX, TROPONINI in the last 168 hours. BNP: BNP (last 3 results) No results for input(s): PROBNP in the last 8760 hours. CBG:  Recent Labs Lab 08/13/14 1140 08/13/14 1618 08/13/14 2203 08/14/14 0647 08/14/14 1144  GLUCAP 96 147* 94 104* 108*       Signed:  Brenin Heidelberger  [facility] 08/14/2014, 1:10 PM

## 2014-08-14 NOTE — Progress Notes (Signed)
1441- pt refused wheelchair for transport to lobby when one offered. Pt walked out of unit with belongings in hand and family member at side. Nurse escorted pt out of unit to lobby. No distress noted.   Andrew AuVafiadis, Ayiana Winslett I 08/14/2014 2:45 PM

## 2014-08-14 NOTE — Progress Notes (Signed)
STROKE TEAM PROGRESS NOTE   HISTORY [name] is a 53 y.o. male who was sitting watching the world series last night. Had the acute onset of dizziness. Then had numbness and weakness in his right upper extremity. Symptoms resolved after about 10 minutes. They recurred again and resolved spontaneously. Patient is now at baseline. NIHSS of 3   Date last known well: Date: 08/10/2014 Time last known well: Time: 20:00 tPA Given: No: Resolution of symptoms   DC SUMMARY: 08-08-14 [id number] [name] is a 53 y.o. 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 See Op report for full details. He was admitted to the neuro ICU in stable condition. BP remained stable. Pt feels well POD #1. No post op complications or events noted. Pt is determined to be stable for discharge. Has been given ASA and Plavix as directed. All instructions, restrictions, medications, and follow up plans have been with patient and his mother. Pt will be scheduled for 2 week follow up appointment.   SUBJECTIVE (INTERVAL HISTORY) Wife at bedside. Discussed recommendations for discharge and need to wait for Dr. Vanessa Barbara.platelet PGY12 test indicates 4 PRU suggesting that Plavix   is being effective with satisfactory platelet inhibition.. Repeat CT scan of the head this morning shows stable appearance of right MCA infarct and hemorrhage without any increase. Dr. [name] recommends decreasing Plavix to  37.5 mg daily and continuing aspirin 325 mg  daily.   OBJECTIVE Temp:  [97.7 F (36.5 C)-98.2 F (36.8 C)] 97.9 F (36.6 C) (11/03 1018) Pulse Rate:  [55-65] 58 (11/03 1018) Cardiac Rhythm:  [-] Sinus bradycardia (11/02 2022) Resp:  [18-198] 198 (11/03 1018) BP: (101-131)/(54-70) 131/65 mmHg (11/03 1018) SpO2:  [95 %-97 %] 96 % (11/03 1018)   Recent Labs Lab 08/13/14 1140 08/13/14 1618 08/13/14 2203 08/14/14 0647 08/14/14 1144  GLUCAP 96 147* 94 104* 108*    Recent Labs Lab 08/09/14 0510 08/11/14 0409  NA 139 138  K 4.0 3.9  CL 103 102  CO2 25 25  GLUCOSE 106* 104*  BUN 9 10  CREATININE 0.82 0.75  CALCIUM 8.8 9.0   No results for input(s): AST, ALT, ALKPHOS, BILITOT, PROT, ALBUMIN in the last 168 hours.  Recent Labs Lab 08/09/14 0510 08/11/14 0409  WBC 8.2 6.2  NEUTROABS 5.1 2.8  HGB 12.0* 12.0*  HCT 35.4* 35.1*  MCV 87.0 86.7  PLT 176 177   No results for input(s): CKTOTAL, CKMB, CKMBINDEX, TROPONINI in the last 168 hours. No results for input(s): LABPROT, INR in the last 72 hours. No results for input(s): COLORURINE, LABSPEC, PHURINE, GLUCOSEU, HGBUR, BILIRUBINUR, KETONESUR, PROTEINUR, UROBILINOGEN, NITRITE, LEUKOCYTESUR in the last 72 hours.  Invalid input(s): APPERANCEUR     Component Value Date/Time   CHOL 114 08/12/2014 0653   TRIG 90 08/12/2014 0653   HDL 31* 08/12/2014 0653   CHOLHDL 3.7 08/12/2014 0653   VLDL 18 08/12/2014 0653   LDLCALC 65 08/12/2014 0653   Lab Results  Component Value Date   HGBA1C 5.9* 08/12/2014   No results found for: LABOPIA, COCAINSCRNUR, LABBENZ, AMPHETMU, THCU, LABBARB  No results for input(s): ETH in the last 168 hours.  Ct Head Wo Contrast 08/12/2014   Stable right hemorrhagic infarct involving the insular and Barre insular cortex in the right MCA distribution. No increasing mass effect. No new infarct. No increasing hemorrhage.    08/11/2014   1. Focal small areas of hemorrhagic transformation of the right middle cerebral artery infarct in the right  insula. No subarachnoid hemorrhage. 2. Marked improvement in edema at the site of the prior infarct with resolution of mass effect upon the right lateral ventricle.     [name] 08/11/2014    Hemorrhagic transformation of right MCA infarct. The infarct volume has increased in size since 07/04/2014 consistent with extension however this does not appear to be acute. The exact time of the infarct extension hemorrhage cannot be determined on this study but occurred in the interval.  No acute infarct on the left.  Interval coiling of left MCA aneurysm.     Dg Chest Port 1 View 08/11/2014    No significant abnormalities.    EEG this is a normal EEG recording during wakefulness and during sleep with no evidence of seizure disorder demonstrated.   PHYSICAL EXAM GENERAL: Pleasant well-built Caucasian male in no acute distress. HEENT: Supple. Atraumatic normocephalic.  ABDOMEN: soft EXTREMITIES: No edema  BACK: Normal. SKIN: Normal by inspection. MENTAL STATUS: Alert and oriented. Speech, language and cognition are generally intact. Judgment and insight normal.  CRANIAL NERVES: Pupils are equal, round and reactive to light and accommodation;Fundi not visualized extra ocular movements are full, there is no significant nystagmus; visual fields are full; upper and lower facial muscles are normal in strength and symmetric, there is no flattening of the nasolabial folds; tongue is midline; uvula is midline; shoulder elevation is normal. MOTOR: Normal tone, bulk and strength; no pronator drift. COORDINATION: Left finger to nose is normal, right finger to nose is normal, No rest tremor; no intention tremor; no postural tremor; no bradykinesia. SENSATION: Normal to light touch.   ASSESSMENT/PLAN [name] is a 53 y.o. male with history of left middle cerebral artery aneurysm coiling, diabetes mellitus, right middle cerebral artery stroke in 07/04/2014, residual right internal carotid  artery aneurysm, with residual right middle cerebral artery aneurysm,presenting with dizziness and right upper extremity numbness. He did not receive IV t-PA as his deficits resolved.  Stroke - symptomatic hemorrhagic transformation of previous right MCA infarct. 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 Left: mild soft plaque origin ICA. 40-5Hx L MCA aneurysm, s/p coiling and stent placements 10/28/20150% ICA stenosis.  2D Echo - indication fraction 55-60%. No cardiac source of emboli identified.  EEG no seizure  SCDs for VTE prophylaxis  Diet Carb Modified diet with thin liquids  OOB with assistance  aspirin 81 mg orally every day and clopidogrel 75 mg orally every day prior to admission given recent aneurysm coiling/stent placement, now on aspirin 325 mg orally every day and plavix 37.5 mg po daily.Dr. Corliss Skainseveshwar managing antiplatelet dosing.  Risk factor education  Ongoing aggressive risk factor management  Resultant - resolution of deficits  Therapy recommendations:  No therapy needs  Disposition:  Anticipate d/c home  Hypertension  Home meds:   Altace 2.5 mg daily - continued at time of admission.  Mildly low at times  Hyperlipidemia  Home meds:  Lipitor 20 mg daily - resumed in hospital  LDL 65 goal < 70  Continue statin at discharge  Diabetes  HgbA1c 5.9 goal < 7.0  Controlled  Other Stroke Risk Factors Cigarette smoker, quit smoking 2 months ago ETOH use . Obesity, Body mass index is 29.76 kg/(m^2).  [name]. 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 Family hx  stroke (father)  Other Pertinent History  Asthma / COPD  No further stroke workup indicated.  Patient has a 10-15% risk of having another stroke over the next year, the highest risk is within 2 weeks of the most recent stroke/TIA (risk of having a stroke following a stroke or TIA is the same).  Ongoing risk factor control by Primary Care Physician  Stroke Service will sign off. Please call should any needs arise.  [facility] in 1 month(s), order placed.  Hospital day # 3  Annie MainSHARON BIBY, MSN, APRN, ANVP-BC, AGPCNP-BC [facility] Pager: [phone] 08/14/2014 12:33 PM   I have personally examined this patient, reviewed notes, independently viewed imaging studies, participated in medical decision making and plan of care. I have made any additions or clarifications directly to the above note. Agree with note above. I had a long discussion with the patient and his wife Re: about plan and they're in agreement.  Delia HeadyPramod Sethi, MD Medical Director [facility] Pager: [phone] 08/14/2014 2:38 PM   To contact Stroke Continuity provider, please refer to [email or web address]. After hours, contact General Neurology

## 2014-08-17 ENCOUNTER — Other Ambulatory Visit: Payer: Self-pay | Admitting: Radiology

## 2014-08-17 LAB — PLATELET INHIBITION P2Y12: PLATELET FUNCTION P2Y12: 3 [PRU] — AB (ref 194–418)

## 2014-08-22 ENCOUNTER — Telehealth: Payer: Self-pay | Admitting: Neurology

## 2014-08-22 ENCOUNTER — Ambulatory Visit (HOSPITAL_COMMUNITY)
Admit: 2014-08-22 | Discharge: 2014-08-22 | Disposition: A | Discharge status: Home / Self Care | Payer: Medicaid Other | Attending: Radiology | Admitting: Radiology

## 2014-08-22 ENCOUNTER — Encounter: Payer: Self-pay | Admitting: Neurology

## 2014-08-22 DIAGNOSIS — I6521 Occlusion and stenosis of right carotid artery: Secondary | ICD-10-CM

## 2014-08-22 DIAGNOSIS — I671 Cerebral aneurysm, nonruptured: Secondary | ICD-10-CM

## 2014-08-22 NOTE — Telephone Encounter (Signed)
Patient's mother called on behalf of the patient to request medical records. I also spoke to the patient to clarify medical records request, and patient wanted records from [id number]. I told the patient medical records representative would call him back.

## 2014-08-22 NOTE — Telephone Encounter (Signed)
I spoke to the patient and transferred his call to the Medical Records Specialist Geoffery SpruceDonna Spencer.

## 2014-08-22 NOTE — Telephone Encounter (Signed)
I have attempted without success to contact this patient by phone to [phone].

## 2014-08-23 ENCOUNTER — Ambulatory Visit (HOSPITAL_COMMUNITY)
Admission: RE | Admit: 2014-08-23 | Discharge: 2014-08-23 | Disposition: A | Discharge status: Home / Self Care | Payer: Medicaid Other | Source: Ambulatory Visit | Attending: Radiology | Admitting: Radiology

## 2014-08-23 ENCOUNTER — Other Ambulatory Visit: Payer: Self-pay | Admitting: Radiology

## 2014-08-23 DIAGNOSIS — E119 Type 2 diabetes mellitus without complications: Secondary | ICD-10-CM | POA: Insufficient documentation

## 2014-08-23 DIAGNOSIS — M542 Cervicalgia: Secondary | ICD-10-CM | POA: Diagnosis present

## 2014-08-23 DIAGNOSIS — Z7982 Long term (current) use of aspirin: Secondary | ICD-10-CM | POA: Insufficient documentation

## 2014-08-23 DIAGNOSIS — Z48812 Encounter for surgical aftercare following surgery on the circulatory system: Secondary | ICD-10-CM | POA: Insufficient documentation

## 2014-08-23 DIAGNOSIS — Z7902 Long term (current) use of antithrombotics/antiplatelets: Secondary | ICD-10-CM | POA: Diagnosis not present

## 2014-08-23 DIAGNOSIS — Z8673 Personal history of transient ischemic attack (TIA), and cerebral infarction without residual deficits: Secondary | ICD-10-CM | POA: Diagnosis not present

## 2014-08-23 LAB — PLATELET INHIBITION P2Y12: Platelet Function  P2Y12: 1 [PRU] — ABNORMAL LOW (ref 194–418)

## 2014-08-27 ENCOUNTER — Encounter (HOSPITAL_COMMUNITY): Payer: Self-pay | Admitting: *Deleted

## 2014-08-27 ENCOUNTER — Emergency Department (HOSPITAL_COMMUNITY)
Admission: EM | Admit: 2014-08-27 | Discharge: 2014-08-27 | Disposition: A | Discharge status: Home / Self Care | Payer: Medicaid Other | Attending: Emergency Medicine | Admitting: Emergency Medicine

## 2014-08-27 ENCOUNTER — Emergency Department (HOSPITAL_COMMUNITY): Payer: Medicaid Other

## 2014-08-27 DIAGNOSIS — Z8669 Personal history of other diseases of the nervous system and sense organs: Secondary | ICD-10-CM

## 2014-08-27 DIAGNOSIS — E119 Type 2 diabetes mellitus without complications: Secondary | ICD-10-CM | POA: Insufficient documentation

## 2014-08-27 DIAGNOSIS — Z7902 Long term (current) use of antithrombotics/antiplatelets: Secondary | ICD-10-CM | POA: Diagnosis not present

## 2014-08-27 DIAGNOSIS — I10 Essential (primary) hypertension: Secondary | ICD-10-CM | POA: Diagnosis not present

## 2014-08-27 DIAGNOSIS — Z7982 Long term (current) use of aspirin: Secondary | ICD-10-CM | POA: Insufficient documentation

## 2014-08-27 DIAGNOSIS — Z79899 Other long term (current) drug therapy: Secondary | ICD-10-CM | POA: Insufficient documentation

## 2014-08-27 DIAGNOSIS — Z8719 Personal history of other diseases of the digestive system: Secondary | ICD-10-CM | POA: Diagnosis not present

## 2014-08-27 DIAGNOSIS — Z8673 Personal history of transient ischemic attack (TIA), and cerebral infarction without residual deficits: Secondary | ICD-10-CM | POA: Diagnosis not present

## 2014-08-27 DIAGNOSIS — H539 Unspecified visual disturbance: Secondary | ICD-10-CM | POA: Insufficient documentation

## 2014-08-27 DIAGNOSIS — J449 Chronic obstructive pulmonary disease, unspecified: Secondary | ICD-10-CM | POA: Diagnosis not present

## 2014-08-27 DIAGNOSIS — Z87891 Personal history of nicotine dependence: Secondary | ICD-10-CM | POA: Diagnosis not present

## 2014-08-27 DIAGNOSIS — G4489 Other headache syndrome: Secondary | ICD-10-CM

## 2014-08-27 DIAGNOSIS — R51 Headache: Secondary | ICD-10-CM | POA: Diagnosis present

## 2014-08-27 LAB — COMPREHENSIVE METABOLIC PANEL
ALBUMIN: 4.1 g/dL (ref 3.5–5.2)
ALT: 20 U/L (ref 0–53)
AST: 19 U/L (ref 0–37)
Alkaline Phosphatase: 72 U/L (ref 39–117)
Anion gap: 14 (ref 5–15)
BILIRUBIN TOTAL: 0.3 mg/dL (ref 0.3–1.2)
BUN: 12 mg/dL (ref 6–23)
CHLORIDE: 102 meq/L (ref 96–112)
CO2: 23 mEq/L (ref 19–32)
Calcium: 9.7 mg/dL (ref 8.4–10.5)
Creatinine, Ser: 0.75 mg/dL (ref 0.50–1.35)
GFR calc Af Amer: >90 mL/min (ref >90)
GFR calc non Af Amer: >90 mL/min (ref >90)
Glucose, Bld: 93 mg/dL (ref 70–99)
Potassium: 3.9 mEq/L (ref 3.7–5.3)
Sodium: 139 mEq/L (ref 137–147)
TOTAL PROTEIN: 7.5 g/dL (ref 6.0–8.3)

## 2014-08-27 LAB — CBC
HCT: 39.1 % (ref 39.0–52.0)
Hemoglobin: 13.8 g/dL (ref 13.0–17.0)
MCH: 29.9 pg (ref 26.0–34.0)
MCHC: 35.3 g/dL (ref 30.0–36.0)
MCV: 84.6 fL (ref 78.0–100.0)
Platelets: 234 10*3/uL (ref 150–400)
RBC: 4.62 MIL/uL (ref 4.22–5.81)
RDW: 12.2 % (ref 11.5–15.5)
WBC: 9.8 10*3/uL (ref 4.0–10.5)

## 2014-08-27 LAB — DIFFERENTIAL
BASOS ABS: 0 10*3/uL (ref 0.0–0.1)
Basophils Relative: 0 % (ref 0–1)
EOS ABS: 0.2 10*3/uL (ref 0.0–0.7)
Eosinophils Relative: 2 % (ref 0–5)
Lymphocytes Relative: 31 % (ref 12–46)
Lymphs Abs: 3.1 10*3/uL (ref 0.7–4.0)
MONO ABS: 0.8 10*3/uL (ref 0.1–1.0)
MONOS PCT: 8 % (ref 3–12)
NEUTROS ABS: 5.7 10*3/uL (ref 1.7–7.7)
NEUTROS PCT: 59 % (ref 43–77)

## 2014-08-27 LAB — I-STAT TROPONIN, ED: TROPONIN I, POC: 0.01 ng/mL (ref 0.00–0.08)

## 2014-08-27 NOTE — ED Notes (Signed)
Pt with hx of coil/stent placement to repair aneurisms to brain (9/ 26/15 and 07/2014).  Pt states acute onset R sided headache starting at 1730.  Called Dr Monia Pouchavenshor's office that stated to call ems.

## 2014-08-27 NOTE — ED Notes (Addendum)
Pt taken to CT.

## 2014-08-27 NOTE — ED Provider Notes (Signed)
CSN: [id number]     Arrival date & time 08/27/14  1829 History   First MD Initiated Contact with Patient 08/27/14 2006     Chief Complaint  Patient presents with  . Headache     (Consider location/radiation/quality/duration/timing/severity/associated sxs/prior Treatment) HPI Comments: Patient is a 53 year old male with a past medical history of asthma, COPD, diabetes, previous ischemic and hemorrhagic stroke, hypertension, and GERD who presents with a headache that started suddenly prior to arrival. He reports sitting on the couch and watching TV when he had a sudden onset of severe, throbbing headache in the right temporoparietal area that does not radiate. At this same time, patient reports "double vision" that lasted about 40 minutes and then resolved. Patient was concerned to due hemorrhagic stroke 2 weeks ago in the same area. Patient denies any other symptoms. No aggravating/alleviating factors. Patient continues to take 1/2 of a plavix tablet and 81mg  aspirin. He has follow up with Neurology as an outpatient.    Past Medical History  Diagnosis Date  . Asthma     Last asthma attack at age 89; History of trach at 16 months  . COPD (chronic obstructive pulmonary disease) 06/2012    Dx by Zoe LanPenny Jones, [facility]  . Headache(784.0)   . Diabetes mellitus without complication   . Stroke     2015  . Hypertension   . GERD (gastroesophageal reflux disease)   . H/O hiatal hernia    Past Surgical History  Procedure Laterality Date  . Tracheostomy closure    . Tracheostomy    . Fracture surgery      NO HX of Broken Bone.  Has rotator cuff tear repair and shoulder/clavicle reconstruction  . Joint replacement      NO HX of JOINT REPLACEMENT.  Rotator cuff tear repair and shoulder reconstruction  . Rectal surgery    . Rotator cuff repair    . Radiology with anesthesia N/A 08/08/2014    Procedure: RADIOLOGY WITH ANESTHESIA EMBOLIZATION;  Surgeon: Medication  Radiologist, MD;  Location: [facility] OR;  Service: Radiology;  Laterality: N/A;   Family History  Problem Relation Age of Onset  . Heart disease Mother     s/p 3V CABG  . Cancer Father 1572    lung cancer; +tobacco  . Cancer Maternal Grandmother   . Heart disease Maternal Grandmother   . Cancer Paternal Grandmother   . Lupus Sister   . Multiple sclerosis Sister   . Anemia Daughter   . Stroke Father    History  Substance Use Topics  . Smoking status: Former Smoker -- 1.00 packs/day    Types: Cigarettes    Start date: 01/15/1975    Quit date: 06/03/2014  . Smokeless tobacco: Never Used     Comment: down from 2 ppd; has used an e-cigarette  . Alcohol Use: 1.8 oz/week    3 Cans of beer per week    Review of Systems  Constitutional: Negative for fever, chills and fatigue.  HENT: Negative for trouble swallowing.   Eyes: Positive for visual disturbance.  Respiratory: Negative for shortness of breath.   Cardiovascular: Negative for chest pain and palpitations.  Gastrointestinal: Negative for nausea, vomiting, abdominal pain and diarrhea.  Genitourinary: Negative for dysuria and difficulty urinating.  Musculoskeletal: Negative for arthralgias and neck pain.  Skin: Negative for color change.  Neurological: Positive for headaches. Negative for dizziness and weakness.  Psychiatric/Behavioral: Negative for dysphoric mood.      Allergies  Tylox  Home Medications   Prior to Admission medications   Medication Sig Start Date End Date Taking? Authorizing Provider  albuterol (PROVENTIL HFA;VENTOLIN HFA) 108 (90 BASE) MCG/ACT inhaler Inhale 1 puff into the lungs every 6 (six) hours as needed for wheezing or shortness of breath.    Historical Provider, MD  aspirin 325 MG tablet Take 1 tablet (325 mg total) by mouth daily. 08/14/14   Jeralyn Bennett, MD  atorvastatin (LIPITOR) 20 MG tablet Take 1 tablet (20 mg total) by mouth daily at 6 PM. 07/06/14   Calvert Cantor, MD  clopidogrel (PLAVIX) 75  MG tablet Take 0.5 tablets (37.5 mg total) by mouth daily. 08/14/14   Jeralyn Bennett, MD  docusate sodium 100 MG CAPS Take 100 mg by mouth 2 (two) times daily. 08/14/14   Jeralyn Bennett, MD  HYDROcodone-acetaminophen (NORCO/VICODIN) 5-325 MG per tablet Take 1 tablet by mouth 2 (two) times daily as needed for moderate pain. 07/08/14   Ricki Rodriguez, MD  metFORMIN (GLUCOPHAGE) 500 MG tablet Take 1 tablet (500 mg total) by mouth 2 (two) times daily with a meal. 07/06/14   Calvert Cantor, MD  nitroGLYCERIN (NITROSTAT) 0.4 MG SL tablet Place 1 tablet (0.4 mg total) under the tongue every 5 (five) minutes x 3 doses as needed for chest pain. 07/08/14   Ricki Rodriguez, MD  ramipril (ALTACE) 2.5 MG capsule Take 2.5 mg by mouth daily.    Historical Provider, MD  tiotropium (SPIRIVA HANDIHALER) 18 MCG inhalation capsule Place 1 capsule (18 mcg total) into inhaler and inhale every morning. 08/14/14   Jeralyn Bennett, MD   BP 146/89 mmHg  Pulse 68  Temp(Src) 98 F (36.7 C)  Resp 21  Ht 5\' 8"  (1.727 m)  Wt 225 lb (102.059 kg)  BMI 34.22 kg/m2  SpO2 97% Physical Exam  Constitutional: He is oriented to person, place, and time. He appears well-developed and well-nourished. No distress.  HENT:  Head: Normocephalic and atraumatic.  Mouth/Throat: No oropharyngeal exudate.  Eyes: Conjunctivae and EOM are normal. Pupils are equal, round, and reactive to light.  Neck: Normal range of motion.  Cardiovascular: Normal rate and regular rhythm.  Exam reveals no gallop and no friction rub.   No murmur heard. Pulmonary/Chest: Effort normal and breath sounds normal. He has no wheezes. He has no rales. He exhibits no tenderness.  Abdominal: Soft. He exhibits no distension. There is no tenderness. There is no rebound.  Musculoskeletal: Normal range of motion.  Neurological: He is alert and oriented to person, place, and time. No cranial nerve deficit. Coordination normal.  Cerebellar function testing within normal limits.  Extremity strength and sensation equal and intact bilaterally. Speech is goal-oriented. Moves limbs without ataxia.   Skin: Skin is warm and dry.  Psychiatric: He has a normal mood and affect. His behavior is normal.  Nursing note and vitals reviewed.   ED Course  Procedures (including critical care time) Labs Review Labs Reviewed  CBC  DIFFERENTIAL  COMPREHENSIVE METABOLIC PANEL  PROTIME-INR  APTT  I-STAT TROPOININ, ED    Imaging Review Ct Head (brain) Wo Contrast  08/27/2014   CLINICAL DATA:  53 year old male with severe headache beginning at 5:30 p.m. tonight. Known history of aneurysm and old bleed on the right side.  EXAM: CT HEAD WITHOUT CONTRAST  TECHNIQUE: Contiguous axial images were obtained from the base of the skull through the vertex without intravenous contrast.  COMPARISON:  Head CT 08/20/2014.  FINDINGS: Again noted is a large area  of low attenuation in the anterior aspect of the right MCA territory, compatible with resolving edema and evolving encephalomalacia/gliosis in the setting of recent right-sided MCA infarct. Previously noted areas of hemorrhage are less apparent within this region, with only a small amount of residual high attenuation noted on today's examination. No new areas of hemorrhage are otherwise identified. Specifically, no intraventricular extension of hemorrhage. No hydrocephalus. Aneurysm clip in the left MCA territory again noted. No acute displaced skull fractures. Visualized paranasal sinuses and mastoids are well pneumatized.  IMPRESSION: 1. Continued evolution of hemorrhagic right MCA infarct, without new acute findings, as detailed above.   Electronically Signed   By: Trudie Reedaniel  Entrikin M.D.   On: 08/27/2014 19:17     EKG Interpretation None      MDM   Final diagnoses:  Other headache syndrome  H/O diplopia    8:27 PM CT head shows no acute findings with improvement of edema and less apparent hemorrhage. I will contact neurosurgery.  Patient reports improvement of headache without intervention and complete resolution of diplopia.   9:37 PM I spoke with Dr. Hosie PoissonSumner about the patient who states the patient has no acute changes and as long as his headache and diplopia are improving, he can be discharged. He instructed the patient to call for a sooner follow up with his Neurologist. Patient instructed to return with worsening or concerning symptoms.   Emilia BeckKaitlyn Daquan Crapps, PA-C 08/27/14 2159  Purvis SheffieldForrest Harrison, MD 08/28/14 1130

## 2014-08-27 NOTE — ED Notes (Signed)
Caitlyn, PA-C, at the bedside.

## 2014-08-27 NOTE — ED Notes (Signed)
Pt monitored by pulse ox, bp cuff, and 12-lead. 

## 2014-08-27 NOTE — ED Notes (Signed)
Dr Romeo AppleHarrison stated d/t pt not having neuro deficits pt not to be code stroke.  However, ordered CT scan.

## 2014-08-27 NOTE — Discharge Instructions (Signed)
Return to the ED with worsening or concerning symptoms. Call your neurologist tomorrow to schedule a sooner follow up.

## 2014-08-29 ENCOUNTER — Ambulatory Visit (INDEPENDENT_AMBULATORY_CARE_PROVIDER_SITE_OTHER): Payer: Medicaid Other | Admitting: Neurology

## 2014-08-29 ENCOUNTER — Other Ambulatory Visit: Payer: Self-pay | Admitting: Radiology

## 2014-08-29 ENCOUNTER — Encounter: Payer: Self-pay | Admitting: Neurology

## 2014-08-29 VITALS — BP 111/75 | HR 67 | Ht 68.0 in | Wt 237.6 lb

## 2014-08-29 DIAGNOSIS — I619 Nontraumatic intracerebral hemorrhage, unspecified: Secondary | ICD-10-CM

## 2014-08-29 DIAGNOSIS — I63131 Cerebral infarction due to embolism of right carotid artery: Secondary | ICD-10-CM

## 2014-08-29 DIAGNOSIS — E785 Hyperlipidemia, unspecified: Secondary | ICD-10-CM

## 2014-08-29 DIAGNOSIS — I671 Cerebral aneurysm, nonruptured: Secondary | ICD-10-CM

## 2014-08-29 NOTE — Patient Instructions (Addendum)
-   continue ASA 81mg  and 37.5mg  plavix as directed by Dr. Corliss Skainseveshwar - 09/25/14 you have appointment with Dr. Corliss Skainseveshwar, do not miss the appointment - check P2Y12 as scheduled - please discuss with Dr. Corliss Skainseveshwar regarding the risk and benefit of the procedure - Follow up with your primary care physician for stroke risk factor modification. Recommend maintain blood pressure goal <130/80, diabetes with hemoglobin A1c goal below 6.5% and lipids with LDL cholesterol goal below 70 mg/dL.  - check BP at home and avoid hypotension - BP goal 110-140 - follow up in 2 months.

## 2014-08-30 LAB — PLATELET INHIBITION P2Y12: Platelet Function  P2Y12: 8 [PRU] — ABNORMAL LOW (ref 194–418)

## 2014-08-30 NOTE — Progress Notes (Addendum)
STROKE NEUROLOGY FOLLOW UP NOTE  NAME: [name] DOB: 1960/10/21  REASON FOR VISIT: stroke follow up HISTORY FROM: pt and chart  Today we had the pleasure of seeing [name] in follow-up at our [facility]rology [facility]. Pt was accompanied by mom.   History Summary 53 year old gentleman with a past medical history of "leaky valve" followed by Dr. Sharyn Lull, asthma was admitted on 07/03/14 for acute onset lightheadedness, left arm numbness as well as left facial droop and slurred speech. MRI showed right MCA stroke. 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 Strong family history of cerebral aneurysms with rupture. Recommended outpt follow up. His stroke was considered related to right ICA stenosis/occlusion, but he was also enrolled in [name] trial and discharged home with home PT/OT. He was also admitted on 07/07/14 for atypical chest pain and work up negative and d/c the next day. 08/08/14 he had left MCA aneurysm coiling with stenting by Dr. [name]. 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 He was taken off [name] and put on ASA 81 and plavix 75mg  on discharge the next day. However, he was re-admitted on 08/11/14 due to transient right arm weakness and dizziness x 2 episodes. CT head showed focal small areas of hemorrhagic transformation on the right MCA infarct in the right insula which was confirmed with MRI. Right arm weakness did not appear to correlate with right-sided hemorrhagic transformation seen on MRI. There is no evidence of acute CVA on left side of brain per MRI report. Suspect his right UE weakness could have been related to TIA. Case was discussed with Dr. [name] who initially recommended increasing aspirin to 325 mg daily along with Plavix 75 mg daily. Repeat head CT on 08/14/2014 showed unchanged appearance of  right MCA territory infarct with hemorrhage, no interval changes from previous study on 08/12/2014. Neurology recommended discharging him on 37.5 mg of Plavix with aspirin 325 mg daily. Patient was discharged in stable condition on 08/14/2014 to his home. 08/27/14 he was seen in ED for acute onset right sided headache. CT head shows no acute findings with improvement of edema and less apparent hemorrhage. He was discharged and asked to follow up with neurology as outpt ASAP.   Interval History During the interval time, the patient has been doing stable. He still complains of intermittent mild pain at right side but tolerable. He stated that he went to see Dr. [name] 11/12  as follow up and discussed about further right ICA intervention and right MCA aneurysm intervention. He has next appointment with Dr. [name] on 09/25/14.  REVIEW OF SYSTEMS: Full 14 system review of systems performed and notable only for those listed below and in HPI above, all others are negative:  Constitutional: N/A  Cardiovascular: N/A  Ear/Nose/Throat: N/A  Skin: N/A  Eyes: N/A  Respiratory: N/A  Gastroitestinal: N/A  Genitourinary: N/A Hematology/Lymphatic: N/A  Endocrine: N/A  Musculoskeletal: neck pain  Allergy/Immunology: N/A  Neurological: headache Psychiatric: N/A  The following represents the patient's updated allergies and side effects list: Allergies  Allergen Reactions  . Tylox [Oxycodone-Acetaminophen] Hives and Other (See Comments)    Reaction: Tremors and diaphoresis     Labs since last visit of relevance include the following: Results for orders placed or performed during the hospital encounter of 08/27/14  CBC  Result Value Ref Range  WBC 9.8 4.0 - 10.5 K/uL   RBC 4.62 4.22 - 5.81 MIL/uL   Hemoglobin 13.8 13.0 - 17.0 g/dL   HCT 16.139.1 09.639.0 - 04.552.0 %   MCV 84.6 78.0 - 100.0 fL   MCH 29.9 26.0 - 34.0 pg   MCHC 35.3 30.0 - 36.0 g/dL   RDW 40.912.2 81.111.5 - 91.415.5 %   Platelets 234 150 - 400 K/uL   Differential  Result Value Ref Range   Neutrophils Relative % 59 43 - 77 %   Neutro Abs 5.7 1.7 - 7.7 K/uL   Lymphocytes Relative 31 12 - 46 %   Lymphs Abs 3.1 0.7 - 4.0 K/uL   Monocytes Relative 8 3 - 12 %   Monocytes Absolute 0.8 0.1 - 1.0 K/uL   Eosinophils Relative 2 0 - 5 %   Eosinophils Absolute 0.2 0.0 - 0.7 K/uL   Basophils Relative 0 0 - 1 %   Basophils Absolute 0.0 0.0 - 0.1 K/uL  Comprehensive metabolic panel  Result Value Ref Range   Sodium 139 137 - 147 mEq/L   Potassium 3.9 3.7 - 5.3 mEq/L   Chloride 102 96 - 112 mEq/L   CO2 23 19 - 32 mEq/L   Glucose, Bld 93 70 - 99 mg/dL   BUN 12 6 - 23 mg/dL   Creatinine, Ser 7.820.75 0.50 - 1.35 mg/dL   Calcium 9.7 8.4 - 95.610.5 mg/dL   Total Protein 7.5 6.0 - 8.3 g/dL   Albumin 4.1 3.5 - 5.2 g/dL   AST 19 0 - 37 U/L   ALT 20 0 - 53 U/L   Alkaline Phosphatase 72 39 - 117 U/L   Total Bilirubin 0.3 0.3 - 1.2 mg/dL   GFR calc non Af Amer >90 >90 mL/min   GFR calc Af Amer >90 >90 mL/min   Anion gap 14 5 - 15  I-stat troponin, ED (not [facility])  Result Value Ref Range   Troponin i, poc 0.01 0.00 - 0.08 ng/mL   Comment 3            The neurologically relevant items on the patient's problem list were reviewed on today's visit.  Neurologic Examination  A problem focused neurological exam (12 or more points of the single system neurologic examination, vital signs counts as 1 point, cranial nerves count for 8 points) was performed.  Blood pressure 111/75, pulse 67, height 5\' 8"  (1.727 m), weight 237 lb 9.6 oz (107.775 kg).  General - Well nourished, well developed, in no apparent distress.  Ophthalmologic - not able to see through.  Cardiovascular - Regular rate and rhythm with no murmur.  Mental Status -  Level of arousal and orientation to time, place, and person were intact. Language including expression, naming, repetition, comprehension was assessed and found intact.  Cranial Nerves II - XII - II - Visual field intact  OU. III, IV, VI - Extraocular movements intact. V - Facial sensation intact bilaterally. VII - Facial movement intact bilaterally. VIII - Hearing & vestibular intact bilaterally. X - Palate elevates symmetrically. XI - Chin turning & shoulder shrug intact bilaterally. XII - Tongue protrusion intact.  Motor Strength - The patient's strength was normal in all extremities and pronator drift was absent.  Bulk was normal and fasciculations were absent.   Motor Tone - Muscle tone was assessed at the neck and appendages and was normal.  Reflexes - The patient's reflexes were normal in all extremities and he had no pathological reflexes.  Sensory - Light touch, temperature/pinprick, vibration and proprioception, and Romberg testing were assessed and were normal.    Coordination - The patient had normal movements in the hands and feet with no ataxia or dysmetria.  Tremor was absent.  Gait and Station - The patient's transfers, posture, gait, station, and turns were observed as normal.  Data reviewed: I personally reviewed the images and agree with the radiology interpretations.  Ct Head (brain) Wo Contrast  08/27/2014   IMPRESSION: 1. Continued evolution of hemorrhagic right MCA infarct, without new acute findings, as detailed above.      08/14/2014   IMPRESSION: Unchanged appearance of right MCA infarct with hemorrhage. No interval changes from 08/12/2014.      08/12/2014   IMPRESSION: Stable right hemorrhagic infarct involving the insular and Barre insular cortex in the right MCA distribution. No increasing mass effect. No new infarct. No increasing hemorrhage.     08/11/2014   IMPRESSION: 1. Focal small areas of hemorrhagic transformation of the right middle cerebral artery infarct in the right insula. No subarachnoid hemorrhage. 2. Marked improvement in edema at the site of the prior infarct with resolution of mass effect upon the right lateral ventricle.    [name]  Contrast  08/11/2014   IMPRESSION: Hemorrhagic transformation of right MCA infarct. The infarct volume has increased in size since 07/04/2014 consistent with extension however this does not appear to be acute. The exact time of the infarct extension hemorrhage cannot be determined on this study but occurred in the interval.  No acute infarct on the left.  Interval coiling of left MCA aneurysm.      Cerebral angio  08/13/2014   IMPRESSION: Status post endovascular complete obliteration of the unruptured left middle cerebral artery region aneurysm using the LVIS JR stent assisted coiling as described.  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    Dg Chest Port 1 View  08/11/2014   IMPRESSION: No significant abnormalities.      EEG 10/31 -  this is a normal EEG recording during wakefulness and during sleep with no evidence of seizure disorder demonstrated.  2D echo - - Left ventricle: The cavity size was normal. There was mild concentric hypertrophy. Systolic function was normal. The estimated ejection fraction was in the range of 55% to 60%. Images were inadequate for LV wall motion assessment. - Left atrium: The atrium was mildly to moderately dilated.  CUS 07/05/14 - Right ICA is occluded. Left: mild soft plaque origin ICA. 40-50% ICA stenosis, velocities may be elevated secondary to compensatory flow. Bilateral vertebral artery flow is antegrade.  Component     Latest Ref Rng 08/12/2014  Cholesterol     0 - 200 mg/dL 161  Triglycerides     <150 mg/dL 90  HDL     >09 mg/dL 31 (L)  Total CHOL/HDL Ratio      3.7  VLDL     0 - 40 mg/dL 18  LDL (calc)     0 - 99 mg/dL 65  Hgb U0A MFr Bld     <5.7 % 5.9 (H)  Mean Plasma Glucose     <117 mg/dL 540 (H)    Assessment: As you may recall,  he is a 53 y.o. Caucasian male with PMH of "leaky valve" and asthma was admitted to [facility] in 06/2013 due to  right MCA stroke. Found to have right ICA occlusion as well as left MCA aneurysm. He had election left MCA aneurysm coiling and stenting. He was put on ASA and plavix. In 07/2014 he had a right arm weakness and CT/MRI showed small hemorrhagic transformation at the right stroke site, not able to explain his right sided weakness. Due to potential TIA episodes, he was changed to ASA 325 and 75 mg plavix. Later that was changed by neurology. So far she is doing stable, wait for P2Y12 result. He has appointment next month with Dr. Corliss Skainseveshwar regarding further procedure about right ICA. However, his right ICA was occluded or near occluded, his MCA already only has minimal flow, right ICA aneurysm has low pressure flow, and clinically he is doing well, I do not think right ICA/MCA procedure is going to give him extra benefit and the risk outweighs the benefit. I would consider conservative treatment.   Plan:  - continue ASA and half dose of plavix.  - pt is to discuss with Dr. Corliss Skainseveshwar for right ICA/MCA procedure during follow up next month. I would recommend conservative measures and risk outweigh benefit at this time. - check P2Y12 as scheduled. - Follow up with your primary care physician for stroke risk factor modification. Recommend maintain blood pressure goal <130/80, diabetes with hemoglobin A1c goal below 6.5% and lipids with LDL cholesterol goal below 70 mg/dL.  - check BP at home, goal 120-140 - quit smoking in 06/2014 - RTC in 2 months   No orders of the defined types were placed in this encounter.    Meds ordered this encounter  Medications  . DISCONTD: aspirin 325 MG tablet    Sig: Take 325 mg by mouth.  . DISCONTD: clopidogrel (PLAVIX) 75 MG tablet    Sig: Take 37.5 mg by mouth.  . DISCONTD: HYDROcodone-acetaminophen (NORCO/VICODIN) 5-325 MG per tablet    Sig:     Refill:  0  .  DISCONTD: nicotine (NICODERM CQ - DOSED IN MG/24 HOURS) 14 mg/24hr patch    Sig:     Refill:  3  . predniSONE (DELTASONE) 10 MG tablet    Sig:     Refill:  0    Patient Instructions  - continue ASA 81mg  and 37.5mg  plavix as directed by Dr. Corliss Skainseveshwar - 09/25/14 you have appointment with Dr. Corliss Skainseveshwar, do not miss the appointment - check P2Y12 as scheduled - please discuss with Dr. Corliss Skainseveshwar regarding the risk and benefit of the procedure - Follow up with your primary care physician for stroke risk factor modification. Recommend maintain blood pressure goal <130/80, diabetes with hemoglobin A1c goal below 6.5% and lipids with LDL cholesterol goal below 70 mg/dL.  - check BP at home and avoid hypotension - BP goal 110-140 - follow up in 2 months.   Ivan PlanJindong Jemima Petko, MD PhD [facility] [address], [address] [place], [place] [address] [phone]

## 2014-08-31 DIAGNOSIS — I63131 Cerebral infarction due to embolism of right carotid artery: Secondary | ICD-10-CM | POA: Insufficient documentation

## 2014-09-01 DIAGNOSIS — I619 Nontraumatic intracerebral hemorrhage, unspecified: Secondary | ICD-10-CM | POA: Insufficient documentation

## 2014-09-01 NOTE — Addendum Note (Signed)
Addended by: Marvel PlanXU, Nirali Magouirk on: 09/01/2014 12:03 AM   Modules accepted: Level of Service

## 2014-09-04 ENCOUNTER — Other Ambulatory Visit (HOSPITAL_COMMUNITY): Payer: Self-pay | Admitting: Interventional Radiology

## 2014-09-04 ENCOUNTER — Telehealth (HOSPITAL_COMMUNITY): Payer: Self-pay | Admitting: Interventional Radiology

## 2014-09-04 DIAGNOSIS — I771 Stricture of artery: Secondary | ICD-10-CM

## 2014-09-04 NOTE — Telephone Encounter (Signed)
Called pt, told him to change his Plavix to 1/2 tablet every other day and to continue on his Aspirin as is at 81mg  1 qd. He states understanding. JM

## 2014-09-19 ENCOUNTER — Ambulatory Visit (HOSPITAL_COMMUNITY)
Admission: RE | Admit: 2014-09-19 | Discharge: 2014-09-19 | Disposition: A | Discharge status: Home / Self Care | Payer: Medicaid Other | Source: Ambulatory Visit | Attending: Interventional Radiology | Admitting: Interventional Radiology

## 2014-09-19 ENCOUNTER — Other Ambulatory Visit: Payer: Self-pay | Admitting: Radiology

## 2014-09-19 DIAGNOSIS — R51 Headache: Secondary | ICD-10-CM | POA: Insufficient documentation

## 2014-09-19 DIAGNOSIS — I771 Stricture of artery: Secondary | ICD-10-CM

## 2014-09-19 LAB — PLATELET INHIBITION P2Y12: Platelet Function  P2Y12: 4 [PRU] — ABNORMAL LOW (ref 194–418)

## 2014-09-20 ENCOUNTER — Emergency Department (HOSPITAL_COMMUNITY): Payer: Medicaid Other

## 2014-09-20 ENCOUNTER — Telehealth (HOSPITAL_COMMUNITY): Payer: Self-pay | Admitting: Interventional Radiology

## 2014-09-20 ENCOUNTER — Encounter (HOSPITAL_COMMUNITY): Payer: Self-pay | Admitting: Emergency Medicine

## 2014-09-20 ENCOUNTER — Emergency Department (HOSPITAL_COMMUNITY)
Admission: EM | Admit: 2014-09-20 | Discharge: 2014-09-20 | Disposition: A | Discharge status: Home / Self Care | Payer: Medicaid Other | Attending: Emergency Medicine | Admitting: Emergency Medicine

## 2014-09-20 DIAGNOSIS — I10 Essential (primary) hypertension: Secondary | ICD-10-CM | POA: Insufficient documentation

## 2014-09-20 DIAGNOSIS — G8929 Other chronic pain: Secondary | ICD-10-CM

## 2014-09-20 DIAGNOSIS — R51 Headache: Secondary | ICD-10-CM | POA: Diagnosis not present

## 2014-09-20 DIAGNOSIS — Z79899 Other long term (current) drug therapy: Secondary | ICD-10-CM | POA: Diagnosis not present

## 2014-09-20 DIAGNOSIS — I729 Aneurysm of unspecified site: Secondary | ICD-10-CM | POA: Diagnosis not present

## 2014-09-20 DIAGNOSIS — Z87891 Personal history of nicotine dependence: Secondary | ICD-10-CM | POA: Diagnosis not present

## 2014-09-20 DIAGNOSIS — Z8719 Personal history of other diseases of the digestive system: Secondary | ICD-10-CM | POA: Diagnosis not present

## 2014-09-20 DIAGNOSIS — E119 Type 2 diabetes mellitus without complications: Secondary | ICD-10-CM | POA: Diagnosis not present

## 2014-09-20 DIAGNOSIS — J449 Chronic obstructive pulmonary disease, unspecified: Secondary | ICD-10-CM | POA: Diagnosis not present

## 2014-09-20 DIAGNOSIS — Z8673 Personal history of transient ischemic attack (TIA), and cerebral infarction without residual deficits: Secondary | ICD-10-CM | POA: Insufficient documentation

## 2014-09-20 DIAGNOSIS — Z7952 Long term (current) use of systemic steroids: Secondary | ICD-10-CM | POA: Insufficient documentation

## 2014-09-20 DIAGNOSIS — Z7982 Long term (current) use of aspirin: Secondary | ICD-10-CM | POA: Insufficient documentation

## 2014-09-20 DIAGNOSIS — Z8679 Personal history of other diseases of the circulatory system: Secondary | ICD-10-CM

## 2014-09-20 LAB — CBC WITH DIFFERENTIAL/PLATELET
Basophils Absolute: 0 10*3/uL (ref 0.0–0.1)
Basophils Relative: 0 % (ref 0–1)
EOS ABS: 0.2 10*3/uL (ref 0.0–0.7)
Eosinophils Relative: 3 % (ref 0–5)
HCT: 38 % — ABNORMAL LOW (ref 39.0–52.0)
HEMOGLOBIN: 13.1 g/dL (ref 13.0–17.0)
LYMPHS ABS: 2.1 10*3/uL (ref 0.7–4.0)
Lymphocytes Relative: 32 % (ref 12–46)
MCH: 30.3 pg (ref 26.0–34.0)
MCHC: 34.5 g/dL (ref 30.0–36.0)
MCV: 88 fL (ref 78.0–100.0)
MONOS PCT: 11 % (ref 3–12)
Monocytes Absolute: 0.7 10*3/uL (ref 0.1–1.0)
NEUTROS ABS: 3.5 10*3/uL (ref 1.7–7.7)
NEUTROS PCT: 54 % (ref 43–77)
PLATELETS: 223 10*3/uL (ref 150–400)
RBC: 4.32 MIL/uL (ref 4.22–5.81)
RDW: 12.3 % (ref 11.5–15.5)
WBC: 6.5 10*3/uL (ref 4.0–10.5)

## 2014-09-20 LAB — BASIC METABOLIC PANEL
Anion gap: 13 (ref 5–15)
BUN: 9 mg/dL (ref 6–23)
CO2: 24 mEq/L (ref 19–32)
Calcium: 9.5 mg/dL (ref 8.4–10.5)
Chloride: 101 mEq/L (ref 96–112)
Creatinine, Ser: 0.7 mg/dL (ref 0.50–1.35)
GFR calc non Af Amer: >90 mL/min (ref >90)
Glucose, Bld: 107 mg/dL — ABNORMAL HIGH (ref 70–99)
Potassium: 4.2 mEq/L (ref 3.7–5.3)
Sodium: 138 mEq/L (ref 137–147)

## 2014-09-20 MED ORDER — MORPHINE SULFATE 4 MG/ML IJ SOLN
4.0000 mg | Freq: Once | INTRAMUSCULAR | Status: AC
  Administered 2014-09-20: 4 mg via INTRAVENOUS
  Filled 2014-09-20: qty 1

## 2014-09-20 MED ORDER — ALBUTEROL SULFATE (2.5 MG/3ML) 0.083% IN NEBU
5.0000 mg | INHALATION_SOLUTION | Freq: Once | RESPIRATORY_TRACT | Status: AC
  Administered 2014-09-20: 5 mg via RESPIRATORY_TRACT
  Filled 2014-09-20: qty 6

## 2014-09-20 MED ORDER — ONDANSETRON HCL 4 MG/2ML IJ SOLN
4.0000 mg | Freq: Once | INTRAMUSCULAR | Status: AC
  Administered 2014-09-20: 4 mg via INTRAVENOUS
  Filled 2014-09-20: qty 2

## 2014-09-20 NOTE — ED Provider Notes (Signed)
CSN: [id number]     Arrival date & time 09/20/14  1048 History   First MD Initiated Contact with Patient 09/20/14 1113     Chief Complaint  Patient presents with  . Headache  . Aneurysm     (Consider location/radiation/quality/duration/timing/severity/associated sxs/prior Treatment) HPI   Patient with hx cerebral aneurysms and steonsis, w/ hx bleed and one aneurysm coiled in October 2015, plan for second aneurysm repair on December 15 with Dr Corliss Skainseveshwar presents with right sided headache.  Pt is on plavix and aspirin. States he has has headaches daily, sometimes mild sometimes severe, has had a constant headache since last night, it is on the right side as it usually is.  The headache kept him awake all night.  At 3:00am he developed a sensation of heat along the right side of his head and face that "felt like someone set me on fire" - this lasted approximately 15 minutes, headache was 8/10 at the time.  He called Dr Fatima Sangereveshwar's office and was told to come to ED.  The daily headache is throbbing and pressure, currently 4/10.  Had one isolated episode of nausea today.  Denies fevers, URI symptoms, vomiting, focal neurologic deficits.  Does have hx asthma and has had increased need for his inhaler recently.  Denies CP, cough.   Past Medical History  Diagnosis Date  . Asthma     Last asthma attack at age 89; History of trach at 16 months  . COPD (chronic obstructive pulmonary disease) 06/2012    Dx by Zoe LanPenny Jones, [facility]  . Headache(784.0)   . Diabetes mellitus without complication   . Stroke     2015  . Hypertension   . GERD (gastroesophageal reflux disease)   . H/O hiatal hernia    Past Surgical History  Procedure Laterality Date  . Tracheostomy closure    . Tracheostomy    . Fracture surgery      NO HX of Broken Bone.  Has rotator cuff tear repair and shoulder/clavicle reconstruction  . Joint replacement      NO HX of JOINT REPLACEMENT.  Rotator cuff tear  repair and shoulder reconstruction  . Rectal surgery    . Rotator cuff repair    . Radiology with anesthesia N/A 08/08/2014    Procedure: RADIOLOGY WITH ANESTHESIA EMBOLIZATION;  Surgeon: Medication Radiologist, MD;  Location: [facility] OR;  Service: Radiology;  Laterality: N/A;   Family History  Problem Relation Age of Onset  . Heart disease Mother     s/p 3V CABG  . Cancer Father 7072    lung cancer; +tobacco  . Cancer Maternal Grandmother   . Heart disease Maternal Grandmother   . Cancer Paternal Grandmother   . Lupus Sister   . Multiple sclerosis Sister   . Anemia Daughter   . Stroke Father    History  Substance Use Topics  . Smoking status: Former Smoker -- 1.00 packs/day    Types: Cigarettes    Start date: 01/15/1975    Quit date: 06/03/2014  . Smokeless tobacco: Never Used     Comment: down from 2 ppd; has used an e-cigarette  . Alcohol Use: No    Review of Systems  All other systems reviewed and are negative.     Allergies  Tylox  Home Medications   Prior to Admission medications   Medication Sig Start Date End Date Taking? Authorizing Provider  albuterol (PROVENTIL HFA;VENTOLIN HFA) 108 (90 BASE) MCG/ACT inhaler Inhale 1 puff into  the lungs every 6 (six) hours as needed for wheezing or shortness of breath.    Historical Provider, MD  aspirin 81 MG tablet Take 81 mg by mouth daily.    Historical Provider, MD  atorvastatin (LIPITOR) 20 MG tablet Take 1 tablet (20 mg total) by mouth daily at 6 PM. 07/06/14   Calvert CantorSaima Rizwan, MD  clopidogrel (PLAVIX) 75 MG tablet Take 0.5 tablets (37.5 mg total) by mouth daily. 08/14/14   Jeralyn BennettEzequiel Zamora, MD  metFORMIN (GLUCOPHAGE) 500 MG tablet Take 1 tablet (500 mg total) by mouth 2 (two) times daily with a meal. 07/06/14   Calvert CantorSaima Rizwan, MD  nitroGLYCERIN (NITROSTAT) 0.4 MG SL tablet Place 1 tablet (0.4 mg total) under the tongue every 5 (five) minutes x 3 doses as needed for chest pain. 07/08/14   Ricki RodriguezAjay S Kadakia, MD  predniSONE (DELTASONE)  10 MG tablet  06/21/14   Historical Provider, MD  ramipril (ALTACE) 2.5 MG capsule Take 2.5 mg by mouth daily.    Historical Provider, MD  tiotropium (SPIRIVA HANDIHALER) 18 MCG inhalation capsule Place 1 capsule (18 mcg total) into inhaler and inhale every morning. Patient taking differently: Place 18 mcg into inhaler and inhale as needed (for shortness of breath).  08/14/14   Jeralyn BennettEzequiel Zamora, MD   BP 119/71 mmHg  Pulse 62  Temp(Src) 98 F (36.7 C) (Oral)  Resp 20  SpO2 97% Physical Exam  Constitutional: He appears well-developed and well-nourished. No distress.  HENT:  Head: Normocephalic and atraumatic.  Neck: Neck supple.  Cardiovascular: Normal rate and regular rhythm.   Pulmonary/Chest: Effort normal and breath sounds normal. No respiratory distress. He has no wheezes. He has no rales.  Abdominal: Soft. He exhibits no distension and no mass. There is no tenderness. There is no rebound and no guarding.  Neurological: He is alert. He exhibits normal muscle tone.  CN II-XII intact, EOMs intact, no pronator drift, grip strengths equal bilaterally; strength 5/5 in all extremities, sensation intact in all extremities; finger to nose, heel to shin, rapid alternating movements normal; gait is normal.     Skin: He is not diaphoretic.  Nursing note and vitals reviewed.   ED Course  Procedures (including critical care time) Labs Review Labs Reviewed  BASIC METABOLIC PANEL - Abnormal; Notable for the following:    Glucose, Bld 107 (*)    All other components within normal limits  CBC WITH DIFFERENTIAL - Abnormal; Notable for the following:    HCT 38.0 (*)    All other components within normal limits    Imaging Review Ct Head Wo Contrast  09/20/2014   CLINICAL DATA:  53 year old male with sudden onset right side headache. Initial encounter. Current history of MCA bifurcation aneurysms detected on brain MRA in September, left MCA aneurysm coiled with endovascular technique in October.  Partial history of right MCA infarct in September with critical right ICA stenosis.  EXAM: CT HEAD WITHOUT CONTRAST  TECHNIQUE: Contiguous axial images were obtained from the base of the skull through the vertex without intravenous contrast.  COMPARISON:  Head CTs without contrast 08/27/2014 and earlier.  FINDINGS: Left MCA stent assisted aneurysm coil embolization sequelae re - identified with stable coil pack configuration. Associated streak artifact.  Stable visualized osseous structures. Stable paranasal sinuses and mastoids. Negative orbit and scalp soft tissues.  Evolved right MCA infarct, with regressed gyriform hyperdensity since November. Developing encephalomalacia. No mass effect. No midline shift or ventriculomegaly.  Elsewhere Stable and normal gray-white matter differentiation. No evidence  of cortically based acute infarction identified. No acute intracranial hemorrhage identified. No suspicious intracranial vascular hyperdensity.  IMPRESSION: 1. No new intracranial abnormality. 2. Sequelae of left MCA bifurcation aneurysm coil embolization, stable. 3. Expected evolution of right MCA infarct since September.   Electronically Signed   By: Augusto Gamble M.D.   On: 09/20/2014 13:56     EKG Interpretation   Date/Time:  Thursday September 20 2014 11:55:11 EST Ventricular Rate:  58 PR Interval:  155 QRS Duration: 110 QT Interval:  441 QTC Calculation: 433 R Axis:   -22 Text Interpretation:  Sinus rhythm Left ventricular hypertrophy Borderline  T abnormalities, inferior leads Confirmed by BELFI  MD, MELANIE ([id number]) on  09/20/2014 12:02:30 PM       Dr Fatima Sanger NP came into the room with the patient while I was interviewing and examining the patient.  Karle Barr, NP.  She states Dr [name] is aware of the patient and they will review the head CT when it is available, will follow along with me.    2:08 PM Patient reports headache is much improved.  CT negative for acute changes.   Reports breathing improved after neb treatment.  3:25 PM NP for Dr [name] (as above) has spoken with patient and made medication changes over the phone, has arranged follow up.  They agree with discharge home.  I spoke with her directly and she does not request any new or changed prescriptions from the ED.    MDM   Final diagnoses:  Chronic nonintractable headache, unspecified headache type  Hx of aneurysm   Afebrile, nontoxic patient with known cerebral aneurysm and stenosis, p/w headache.  Pt has chronic headache but new abnormal heat sensation last night.  No neurologic deficits.  No focal neurologic complaints other than pain.  "Heat" resolved spontaneously after 15 minutes.  CT head negative for acute change.  Pain resolved with one dose of pain medication. Discussed with Dr Fatima Sanger NP who has reviewed case and CT with Dr [name].  Please see note above regarding conversation with her.   D/C home with close follow up with Dr [name], medication changes per conversation NP had over telephone with patient (change in frequency of blood thinner).  Discussed result, findings, treatment, and follow up  with patient.  Pt given return precautions.  Pt verbalizes understanding and agrees with plan.         [place], PA-C 09/20/14 1533  Rolan Bucco, MD 09/20/14 [phone]

## 2014-09-20 NOTE — Telephone Encounter (Signed)
Pt came to ED today due to severe headache last night and feeling like his head was "burning." Pt had a CT scan w/o contrast. Deveshwar reviewed scan and said there continued to be resolving infarct. Pt was ok to be discharged. Elizbeth SquiresGave Emily West, PA in ED the message that patient could be discharged. Spoke to patient and his mother and told them both that due to patient's recent P2Y12 of 4 he will need to change his Plavix from 37.5mg  1 QOD to 37.5mg  1 every third day and continue with Aspirin 81mg  1 QD. Instructed patient to call me the last week of December and we would have him come back in at that time for another P2Y12 check. He and his mother both state understanding and are in agreement with this plan of care. JM

## 2014-09-20 NOTE — ED Notes (Signed)
Pt being discharged home with his mother.  

## 2014-09-20 NOTE — ED Notes (Signed)
States has had three aneurysms in brain, 2 strokes in sept , 2015, had stent and clipping done on oct 28th, 2015. Has an aneurysm on right side of brain scheduled to be coiled/clipped on Dec 15th. Alert/oriented x 4, no slurred speech or weakness noted

## 2014-09-20 NOTE — Discharge Instructions (Signed)
Read the information below.  You may return to the Emergency Department at any time for worsening condition or any new symptoms that concern you.  Please follow up with Dr Corliss Skainseveshwar and change your medications as they have recommended.  SEEK MEDICAL ATTENTION IF: You develop possible problems with medications prescribed.  The medications don't resolve your headache, if it recurs , or if you have multiple episodes of vomiting or can't take fluids. You have a change from the usual headache. RETURN IMMEDIATELY IF you develop a sudden, severe headache or confusion, become poorly responsive or faint, develop a fever above 100.58F or problem breathing, have a change in speech, vision, swallowing, or understanding, or develop new weakness, numbness, tingling, incoordination, or have a seizure.

## 2014-09-25 ENCOUNTER — Ambulatory Visit: Payer: BC Managed Care – PPO | Admitting: Neurology

## 2014-10-01 DIAGNOSIS — R7303 Prediabetes: Secondary | ICD-10-CM | POA: Insufficient documentation

## 2014-10-02 ENCOUNTER — Emergency Department (HOSPITAL_COMMUNITY)
Admission: EM | Admit: 2014-10-02 | Discharge: 2014-10-02 | Disposition: A | Discharge status: Home / Self Care | Payer: Medicaid Other | Attending: Emergency Medicine | Admitting: Emergency Medicine

## 2014-10-02 ENCOUNTER — Encounter (HOSPITAL_COMMUNITY): Payer: Self-pay | Admitting: Emergency Medicine

## 2014-10-02 ENCOUNTER — Emergency Department (HOSPITAL_COMMUNITY): Payer: Medicaid Other

## 2014-10-02 DIAGNOSIS — J069 Acute upper respiratory infection, unspecified: Secondary | ICD-10-CM | POA: Diagnosis not present

## 2014-10-02 DIAGNOSIS — Z8673 Personal history of transient ischemic attack (TIA), and cerebral infarction without residual deficits: Secondary | ICD-10-CM | POA: Diagnosis not present

## 2014-10-02 DIAGNOSIS — Z7982 Long term (current) use of aspirin: Secondary | ICD-10-CM | POA: Diagnosis not present

## 2014-10-02 DIAGNOSIS — J441 Chronic obstructive pulmonary disease with (acute) exacerbation: Secondary | ICD-10-CM | POA: Insufficient documentation

## 2014-10-02 DIAGNOSIS — E119 Type 2 diabetes mellitus without complications: Secondary | ICD-10-CM | POA: Insufficient documentation

## 2014-10-02 DIAGNOSIS — R059 Cough, unspecified: Secondary | ICD-10-CM

## 2014-10-02 DIAGNOSIS — R05 Cough: Secondary | ICD-10-CM

## 2014-10-02 DIAGNOSIS — I10 Essential (primary) hypertension: Secondary | ICD-10-CM | POA: Insufficient documentation

## 2014-10-02 DIAGNOSIS — R079 Chest pain, unspecified: Secondary | ICD-10-CM | POA: Diagnosis not present

## 2014-10-02 DIAGNOSIS — Z79899 Other long term (current) drug therapy: Secondary | ICD-10-CM | POA: Diagnosis not present

## 2014-10-02 DIAGNOSIS — Z87891 Personal history of nicotine dependence: Secondary | ICD-10-CM | POA: Diagnosis not present

## 2014-10-02 DIAGNOSIS — Z8719 Personal history of other diseases of the digestive system: Secondary | ICD-10-CM | POA: Insufficient documentation

## 2014-10-02 LAB — COMPREHENSIVE METABOLIC PANEL
ALT: 19 U/L (ref 0–53)
AST: 18 U/L (ref 0–37)
Albumin: 4 g/dL (ref 3.5–5.2)
Alkaline Phosphatase: 74 U/L (ref 39–117)
Anion gap: 5 (ref 5–15)
BILIRUBIN TOTAL: 0.4 mg/dL (ref 0.3–1.2)
BUN: 9 mg/dL (ref 6–23)
CALCIUM: 9.2 mg/dL (ref 8.4–10.5)
CHLORIDE: 104 meq/L (ref 96–112)
CO2: 31 mmol/L (ref 19–32)
Creatinine, Ser: 0.9 mg/dL (ref 0.50–1.35)
GLUCOSE: 111 mg/dL — AB (ref 70–99)
Potassium: 3.8 mmol/L (ref 3.5–5.1)
Sodium: 140 mmol/L (ref 135–145)
Total Protein: 6.4 g/dL (ref 6.0–8.3)

## 2014-10-02 LAB — I-STAT TROPONIN, ED: Troponin i, poc: 0 ng/mL (ref 0.00–0.08)

## 2014-10-02 LAB — CBC
HCT: 37.1 % — ABNORMAL LOW (ref 39.0–52.0)
HEMOGLOBIN: 12.7 g/dL — AB (ref 13.0–17.0)
MCH: 29.7 pg (ref 26.0–34.0)
MCHC: 34.2 g/dL (ref 30.0–36.0)
MCV: 86.9 fL (ref 78.0–100.0)
PLATELETS: 225 10*3/uL (ref 150–400)
RBC: 4.27 MIL/uL (ref 4.22–5.81)
RDW: 12.3 % (ref 11.5–15.5)
WBC: 6.6 10*3/uL (ref 4.0–10.5)

## 2014-10-02 MED ORDER — DOXYCYCLINE HYCLATE 100 MG PO CAPS: 100.0000 mg | ORAL_CAPSULE | Freq: Two times a day (BID) | ORAL | Status: DC

## 2014-10-02 MED ORDER — BENZONATATE 100 MG PO CAPS: 100.0000 mg | ORAL_CAPSULE | Freq: Three times a day (TID) | ORAL | Status: DC

## 2014-10-02 MED ORDER — PREDNISONE 20 MG PO TABS: 40.0000 mg | ORAL_TABLET | Freq: Every day | ORAL | Status: DC

## 2014-10-02 MED ORDER — PREDNISONE 20 MG PO TABS
60.0000 mg | ORAL_TABLET | Freq: Once | ORAL | Status: AC
  Administered 2014-10-02: 60 mg via ORAL
  Filled 2014-10-02: qty 3

## 2014-10-02 MED ORDER — ALBUTEROL SULFATE (2.5 MG/3ML) 0.083% IN NEBU
5.0000 mg | INHALATION_SOLUTION | Freq: Once | RESPIRATORY_TRACT | Status: AC
  Administered 2014-10-02: 5 mg via RESPIRATORY_TRACT
  Filled 2014-10-02: qty 6

## 2014-10-02 NOTE — ED Provider Notes (Signed)
CSN: [id number]     Arrival date & time 10/02/14  1159 History   First MD Initiated Contact with Patient 10/02/14 1258     Chief Complaint  Patient presents with  . Chest Pain  . Cough  . Nasal Congestion     (Consider location/radiation/quality/duration/timing/severity/associated sxs/prior Treatment) HPI [name] is a 53 year old male with past medical history asthma, COPD, diabetes, CVA with coil placement in 07/2014, hypertension, GERD, hiatal hernia who presents the ER with one day of cough and chest discomfort. Patient states his cough began yesterday after approximately one week of nasal congestion. Patient states his chest discomfort began with a cough, and ear describes discomfort as a burning sensation throughout his sternal region. Patient states the discomfort is only present when coughing or when taking a deep breath. Patient states his cough has been mildly productive of green sputum, and he reports shortness of breath which she states is not any worse from his baseline due to his COPD. Patient denies any associated headache, dizziness, weakness, blurred vision, palpitations, syncope, dyspnea, nausea, vomiting, abdominal pain, dysuria, diarrhea.  Past Medical History  Diagnosis Date  . Asthma     Last asthma attack at age 89; History of trach at 16 months  . COPD (chronic obstructive pulmonary disease) 06/2012    [facility]  . Headache(784.0)   . Diabetes mellitus without complication   . Stroke     2015  . Hypertension   . GERD (gastroesophageal reflux disease)   . H/O hiatal hernia    Past Surgical History  Procedure Laterality Date  . Tracheostomy closure    . Tracheostomy    . Fracture surgery      NO HX of Broken Bone.  Has rotator cuff tear repair and shoulder/clavicle reconstruction  . Joint replacement      NO HX of JOINT REPLACEMENT.  Rotator cuff tear repair and shoulder reconstruction  . Rectal surgery    .  Rotator cuff repair    . Radiology with anesthesia N/A 08/08/2014    Procedure: RADIOLOGY WITH ANESTHESIA EMBOLIZATION;  Surgeon: Medication Radiologist, MD;  Location: [facility] OR;  Service: Radiology;  Laterality: N/A;   Family History  Problem Relation Age of Onset  . Heart disease Mother     s/p 3V CABG  . Cancer Father [id number]    lung cancer; +tobacco  . Cancer Maternal Grandmother   . Heart disease Maternal Grandmother   . Cancer Paternal Grandmother   . Lupus Sister   . Multiple sclerosis Sister   . Anemia Daughter   . Stroke Father    History  Substance Use Topics  . Smoking status: Former Smoker -- 1.00 packs/day    Types: Cigarettes    Start date: 01/15/1975    Quit date: 06/03/2014  . Smokeless tobacco: Never Used     Comment: down from 2 ppd; has used an e-cigarette  . Alcohol Use: No    Review of Systems  Constitutional: Negative for fever.  HENT: Negative for trouble swallowing.   Eyes: Negative for visual disturbance.  Respiratory: Negative for shortness of breath.   Cardiovascular: Negative for chest pain.  Gastrointestinal: Negative for nausea, vomiting and abdominal pain.  Genitourinary: Negative for dysuria.  Musculoskeletal: Negative for neck pain.  Skin: Negative for rash.  Neurological: Negative for dizziness, weakness and numbness.  Psychiatric/Behavioral: Negative.       Allergies  Tylox  Home Medications   Prior to Admission  medications   Medication Sig Start Date End Date Taking? Authorizing Provider  albuterol (PROVENTIL HFA;VENTOLIN HFA) 108 (90 BASE) MCG/ACT inhaler Inhale 1 puff into the lungs every 6 (six) hours as needed for wheezing or shortness of breath.   Yes Historical Provider, MD  aspirin 81 MG tablet Take 81 mg by mouth daily.   Yes Historical Provider, MD  atorvastatin (LIPITOR) 20 MG tablet Take 1 tablet (20 mg total) by mouth daily at 6 PM. 07/06/14  Yes Calvert Cantor, MD  clopidogrel (PLAVIX) 75 MG tablet Take 0.5 tablets (37.5 mg  total) by mouth daily. Patient taking differently: Take 37.5 mg by mouth every 3 (three) days.  08/14/14  Yes Jeralyn Bennett, MD  metFORMIN (GLUCOPHAGE) 500 MG tablet Take 1 tablet (500 mg total) by mouth 2 (two) times daily with a meal. 07/06/14  Yes Calvert Cantor, MD  nitroGLYCERIN (NITROSTAT) 0.4 MG SL tablet Place 1 tablet (0.4 mg total) under the tongue every 5 (five) minutes x 3 doses as needed for chest pain. 07/08/14  Yes Ricki Rodriguez, MD  ramipril (ALTACE) 2.5 MG capsule Take 2.5 mg by mouth daily.   Yes Historical Provider, MD  tiotropium (SPIRIVA HANDIHALER) 18 MCG inhalation capsule Place 1 capsule (18 mcg total) into inhaler and inhale every morning. Patient taking differently: Place 18 mcg into inhaler and inhale as needed (for shortness of breath).  08/14/14  Yes Jeralyn Bennett, MD  benzonatate (TESSALON) 100 MG capsule Take 1 capsule (100 mg total) by mouth every 8 (eight) hours. 10/02/14   Monte Fantasia, PA-C  doxycycline (VIBRAMYCIN) 100 MG capsule Take 1 capsule (100 mg total) by mouth 2 (two) times daily. One po bid x 7 days 10/02/14   Monte Fantasia, PA-C  predniSONE (DELTASONE) 20 MG tablet Take 2 tablets (40 mg total) by mouth daily with breakfast. 10/02/14   Monte Fantasia, PA-C   BP 115/60 mmHg  Pulse 64  Temp(Src) 97.9 F (36.6 C) (Oral)  Resp 20  Ht 5\' 8"  (1.727 m)  Wt 236 lb 8 oz (107.276 kg)  BMI 35.97 kg/m2  SpO2 100% Physical Exam  Constitutional: He is oriented to person, place, and time. He appears well-developed and well-nourished. No distress.  HENT:  Head: Normocephalic and atraumatic.  Mouth/Throat: Oropharynx is clear and moist. No oropharyngeal exudate.  Eyes: Right eye exhibits no discharge. Left eye exhibits no discharge. No scleral icterus.  Neck: Normal range of motion.  Cardiovascular: Normal rate, regular rhythm, S1 normal, S2 normal and normal heart sounds.   No murmur heard. Pulmonary/Chest: Effort normal. No accessory muscle usage. No  tachypnea. No respiratory distress. He has wheezes in the right upper field, the right middle field, the left upper field and the left middle field.  Abdominal: Soft. Normal appearance and bowel sounds are normal. There is no tenderness.  Musculoskeletal: Normal range of motion. He exhibits no edema or tenderness.  Neurological: He is alert and oriented to person, place, and time. He has normal strength. No cranial nerve deficit or sensory deficit. Coordination normal. GCS eye subscore is 4. GCS verbal subscore is 5. GCS motor subscore is 6.  Patient fully alert answering questions appropriately in full, clear sentences. Cranial nerves II through XII grossly intact. Motor strength 5 out of 5 in all major muscle groups of upper and lower extremities. Distal sensation intact.  Skin: Skin is warm and dry. No rash noted. He is not diaphoretic.  Psychiatric: He has a normal mood and affect.  Nursing note and vitals reviewed.   ED Course  Procedures (including critical care time) Labs Review Labs Reviewed  CBC - Abnormal; Notable for the following:    Hemoglobin 12.7 (*)    HCT 37.1 (*)    All other components within normal limits  COMPREHENSIVE METABOLIC PANEL - Abnormal; Notable for the following:    Glucose, Bld 111 (*)    All other components within normal limits  I-STAT TROPOININ, ED    Imaging Review Dg Chest 2 View  10/02/2014   CLINICAL DATA:  Cough and shortness of breath for 1 day  EXAM: CHEST  2 VIEW  COMPARISON:  August 11, 2014  FINDINGS: The lungs are clear. Heart size and pulmonary vascularity are normal. No adenopathy. No bone lesions.  IMPRESSION: No edema or consolidation.   Electronically Signed   By: Bretta BangWilliam  Woodruff M.D.   On: 10/02/2014 14:04     EKG Interpretation None      MDM   Final diagnoses:  Cough  URI (upper respiratory infection)    Patient here with cough times one day after 1 week of nasal congestion. Patient reporting chest discomfort, which is  tender to palpation, and only present when patient coughs or takes a deep breath. Patient with moderate amount of wheezes on exam, likely patient's chest pain is due to wheezing and his cough, however will rule out for ACS, and rule out pneumonia.  Patient's labs unremarkable for any acute pathology. Troponin negative. EKG unchanged from previous without any evidence of acute injury or ectopy. Chest radiographs unremarkable for any edema or consolidation. No active cardiopulmonary disease noted. Wells criteria negative for PE. HEART score 3, which places patient at low risk in the same category as 0. Due to the fact the patient's pain is reproducible with palpation, coughing and inspiration, it seems most likely that his pain is due to musculoskeletal pain from coughing or muscle strain. Patient given albuterol treatment in the ER to help his wheezing. Wheezing improved after albuterol treatment and lungs clear on reexam. On reexam also patient is non-tachypneic, non-tachycardic, non-hypoxic, afebrile, lying supine in the stretcher and in no acute distress. Patient to be discharged and to follow-up with his primary care physician. Patient discharged on prednisone, doxycycline, Tessalon Perles. I discussed return precautions with patient and strongly encouraged him to follow-up with his PCP. Patient was agreeable to this plan. I encouraged patient to call or return to the ER should he have any questions or concerns.  BP 115/60 mmHg  Pulse 64  Temp(Src) 97.9 F (36.6 C) (Oral)  Resp 20  Ht 5\' 8"  (1.727 m)  Wt 236 lb 8 oz (107.276 kg)  BMI 35.97 kg/m2  SpO2 100%  Signed,  Ladona MowJoe Naomie Crow, PA-C 1:02 AM  Patient seen and discussed with Dr. Geoffery Lyonsouglas Delo, M.D.   Monte FantasiaJoseph W Taejon Irani, PA-C 10/03/14 0102  Geoffery Lyonsouglas Delo, MD 10/03/14 [phone]

## 2014-10-02 NOTE — ED Notes (Signed)
Pt ambulated to the bathroom, pt tolerated very well.

## 2014-10-02 NOTE — Discharge Instructions (Signed)
Follow-up with your primary care physician. Return to the ER if any severe chest pain, shortness of breath, dizziness, weakness, nausea, abdominal pain.  Upper Respiratory Infection, Adult An upper respiratory infection (URI) is also sometimes known as the common cold. The upper respiratory tract includes the nose, sinuses, throat, trachea, and bronchi. Bronchi are the airways leading to the lungs. Most people improve within 1 week, but symptoms can last up to 2 weeks. A residual cough may last even longer.  CAUSES Many different viruses can infect the tissues lining the upper respiratory tract. The tissues become irritated and inflamed and often become very moist. Mucus production is also common. A cold is contagious. You can easily spread the virus to others by oral contact. This includes kissing, sharing a glass, coughing, or sneezing. Touching your mouth or nose and then touching a surface, which is then touched by another person, can also spread the virus. SYMPTOMS  Symptoms typically develop 1 to 3 days after you come in contact with a cold virus. Symptoms vary from person to person. They may include:  Runny nose.  Sneezing.  Nasal congestion.  Sinus irritation.  Sore throat.  Loss of voice (laryngitis).  Cough.  Fatigue.  Muscle aches.  Loss of appetite.  Headache.  Low-grade fever. DIAGNOSIS  You might diagnose your own cold based on familiar symptoms, since most people get a cold 2 to 3 times a year. Your caregiver can confirm this based on your exam. Most importantly, your caregiver can check that your symptoms are not due to another disease such as strep throat, sinusitis, pneumonia, asthma, or epiglottitis. Blood tests, throat tests, and X-rays are not necessary to diagnose a common cold, but they may sometimes be helpful in excluding other more serious diseases. Your caregiver will decide if any further tests are required. RISKS AND COMPLICATIONS  You may be at risk  for a more severe case of the common cold if you smoke cigarettes, have chronic heart disease (such as heart failure) or lung disease (such as asthma), or if you have a weakened immune system. The very young and very old are also at risk for more serious infections. Bacterial sinusitis, middle ear infections, and bacterial pneumonia can complicate the common cold. The common cold can worsen asthma and chronic obstructive pulmonary disease (COPD). Sometimes, these complications can require emergency medical care and may be life-threatening. PREVENTION  The best way to protect against getting a cold is to practice good hygiene. Avoid oral or hand contact with people with cold symptoms. Wash your hands often if contact occurs. There is no clear evidence that vitamin C, vitamin E, echinacea, or exercise reduces the chance of developing a cold. However, it is always recommended to get plenty of rest and practice good nutrition. TREATMENT  Treatment is directed at relieving symptoms. There is no cure. Antibiotics are not effective, because the infection is caused by a virus, not by bacteria. Treatment may include:  Increased fluid intake. Sports drinks offer valuable electrolytes, sugars, and fluids.  Breathing heated mist or steam (vaporizer or shower).  Eating chicken soup or other clear broths, and maintaining good nutrition.  Getting plenty of rest.  Using gargles or lozenges for comfort.  Controlling fevers with ibuprofen or acetaminophen as directed by your caregiver.  Increasing usage of your inhaler if you have asthma. Zinc gel and zinc lozenges, taken in the first 24 hours of the common cold, can shorten the duration and lessen the severity of symptoms. Pain medicines  may help with fever, muscle aches, and throat pain. A variety of non-prescription medicines are available to treat congestion and runny nose. Your caregiver can make recommendations and may suggest nasal or lung inhalers for other  symptoms.  HOME CARE INSTRUCTIONS   Only take over-the-counter or prescription medicines for pain, discomfort, or fever as directed by your caregiver.  Use a warm mist humidifier or inhale steam from a shower to increase air moisture. This may keep secretions moist and make it easier to breathe.  Drink enough water and fluids to keep your urine clear or pale yellow.  Rest as needed.  Return to work when your temperature has returned to normal or as your caregiver advises. You may need to stay home longer to avoid infecting others. You can also use a face mask and careful hand washing to prevent spread of the virus. SEEK MEDICAL CARE IF:   After the first few days, you feel you are getting worse rather than better.  You need your caregiver's advice about medicines to control symptoms.  You develop chills, worsening shortness of breath, or brown or red sputum. These may be signs of pneumonia.  You develop yellow or brown nasal discharge or pain in the face, especially when you bend forward. These may be signs of sinusitis.  You develop a fever, swollen neck glands, pain with swallowing, or white areas in the back of your throat. These may be signs of strep throat. SEEK IMMEDIATE MEDICAL CARE IF:   You have a fever.  You develop severe or persistent headache, ear pain, sinus pain, or chest pain.  You develop wheezing, a prolonged cough, cough up blood, or have a change in your usual mucus (if you have chronic lung disease).  You develop sore muscles or a stiff neck. Document Released: 03/24/2001 Document Revised: 12/21/2011 Document Reviewed: 01/03/2014 [employer] Patient Information 2015 [place]. This information is not intended to replace advice given to you by your health care provider. Make sure you discuss any questions you have with your health care provider.  Sinusitis Sinusitis is redness, soreness, and inflammation of the paranasal sinuses. Paranasal sinuses are air  pockets within the bones of your face (beneath the eyes, the middle of the forehead, or above the eyes). In healthy paranasal sinuses, mucus is able to drain out, and air is able to circulate through them by way of your nose. However, when your paranasal sinuses are inflamed, mucus and air can become trapped. This can allow bacteria and other germs to grow and cause infection. Sinusitis can develop quickly and last only a short time (acute) or continue over a long period (chronic). Sinusitis that lasts for more than 12 weeks is considered chronic.  CAUSES  Causes of sinusitis include:  Allergies.  Structural abnormalities, such as displacement of the cartilage that separates your nostrils (deviated septum), which can decrease the air flow through your nose and sinuses and affect sinus drainage.  Functional abnormalities, such as when the small hairs (cilia) that line your sinuses and help remove mucus do not work properly or are not present. SIGNS AND SYMPTOMS  Symptoms of acute and chronic sinusitis are the same. The primary symptoms are pain and pressure around the affected sinuses. Other symptoms include:  Upper toothache.  Earache.  Headache.  Bad breath.  Decreased sense of smell and taste.  A cough, which worsens when you are lying flat.  Fatigue.  Fever.  Thick drainage from your nose, which often is green and may  contain pus (purulent).  Swelling and warmth over the affected sinuses. DIAGNOSIS  Your health care provider will perform a physical exam. During the exam, your health care provider may:  Look in your nose for signs of abnormal growths in your nostrils (nasal polyps).  Tap over the affected sinus to check for signs of infection.  View the inside of your sinuses (endoscopy) using an imaging device that has a light attached (endoscope). If your health care provider suspects that you have chronic sinusitis, one or more of the following tests may be  recommended:  Allergy tests.  Nasal culture. A sample of mucus is taken from your nose, sent to a lab, and screened for bacteria.  Nasal cytology. A sample of mucus is taken from your nose and examined by your health care provider to determine if your sinusitis is related to an allergy. TREATMENT  Most cases of acute sinusitis are related to a viral infection and will resolve on their own within 10 days. Sometimes medicines are prescribed to help relieve symptoms (pain medicine, decongestants, nasal steroid sprays, or saline sprays).  However, for sinusitis related to a bacterial infection, your health care provider will prescribe antibiotic medicines. These are medicines that will help kill the bacteria causing the infection.  Rarely, sinusitis is caused by a fungal infection. In theses cases, your health care provider will prescribe antifungal medicine. For some cases of chronic sinusitis, surgery is needed. Generally, these are cases in which sinusitis recurs more than 3 times per year, despite other treatments. HOME CARE INSTRUCTIONS   Drink plenty of water. Water helps thin the mucus so your sinuses can drain more easily.  Use a humidifier.  Inhale steam 3 to 4 times a day (for example, sit in the bathroom with the shower running).  Apply a warm, moist washcloth to your face 3 to 4 times a day, or as directed by your health care provider.  Use saline nasal sprays to help moisten and clean your sinuses.  Take medicines only as directed by your health care provider.  If you were prescribed either an antibiotic or antifungal medicine, finish it all even if you start to feel better. SEEK IMMEDIATE MEDICAL CARE IF:  You have increasing pain or severe headaches.  You have nausea, vomiting, or drowsiness.  You have swelling around your face.  You have vision problems.  You have a stiff neck.  You have difficulty breathing. MAKE SURE YOU:   Understand these  instructions.  Will watch your condition.  Will get help right away if you are not doing well or get worse. Document Released: 09/28/2005 Document Revised: 02/12/2014 Document Reviewed: 10/13/2011 [facility] Patient Information 2015 [place], [place]. This information is not intended to replace advice given to you by your health care provider. Make sure you discuss any questions you have with your health care provider.

## 2014-10-02 NOTE — ED Notes (Signed)
Pt. Stated, I'm having some chest pain with a cough and congestion for about 3 days but the chest pain started this morning.

## 2014-10-08 ENCOUNTER — Other Ambulatory Visit: Payer: Self-pay | Admitting: Radiology

## 2014-10-08 LAB — PLATELET INHIBITION P2Y12

## 2014-10-09 LAB — PLATELET INHIBITION P2Y12: PLATELET FUNCTION P2Y12: 8 [PRU] — AB (ref 194–418)

## 2014-10-12 HISTORY — PX: SHOULDER ARTHROSCOPY WITH OPEN ROTATOR CUFF REPAIR: SHX6092

## 2014-10-14 ENCOUNTER — Emergency Department (HOSPITAL_COMMUNITY)
Admission: EM | Admit: 2014-10-14 | Discharge: 2014-10-14 | Disposition: A | Discharge status: Home / Self Care | Payer: Medicaid Other | Attending: Emergency Medicine | Admitting: Emergency Medicine

## 2014-10-14 ENCOUNTER — Emergency Department (HOSPITAL_COMMUNITY): Payer: Medicaid Other

## 2014-10-14 ENCOUNTER — Encounter (HOSPITAL_COMMUNITY): Payer: Self-pay | Admitting: Emergency Medicine

## 2014-10-14 DIAGNOSIS — J449 Chronic obstructive pulmonary disease, unspecified: Secondary | ICD-10-CM | POA: Diagnosis not present

## 2014-10-14 DIAGNOSIS — Z7982 Long term (current) use of aspirin: Secondary | ICD-10-CM | POA: Diagnosis not present

## 2014-10-14 DIAGNOSIS — S0990XA Unspecified injury of head, initial encounter: Secondary | ICD-10-CM | POA: Diagnosis not present

## 2014-10-14 DIAGNOSIS — I10 Essential (primary) hypertension: Secondary | ICD-10-CM | POA: Diagnosis not present

## 2014-10-14 DIAGNOSIS — Y9289 Other specified places as the place of occurrence of the external cause: Secondary | ICD-10-CM | POA: Diagnosis not present

## 2014-10-14 DIAGNOSIS — Y998 Other external cause status: Secondary | ICD-10-CM | POA: Diagnosis not present

## 2014-10-14 DIAGNOSIS — Z87891 Personal history of nicotine dependence: Secondary | ICD-10-CM | POA: Insufficient documentation

## 2014-10-14 DIAGNOSIS — Z79899 Other long term (current) drug therapy: Secondary | ICD-10-CM | POA: Diagnosis not present

## 2014-10-14 DIAGNOSIS — Z8719 Personal history of other diseases of the digestive system: Secondary | ICD-10-CM | POA: Diagnosis not present

## 2014-10-14 DIAGNOSIS — E119 Type 2 diabetes mellitus without complications: Secondary | ICD-10-CM | POA: Diagnosis not present

## 2014-10-14 DIAGNOSIS — Y9389 Activity, other specified: Secondary | ICD-10-CM | POA: Diagnosis not present

## 2014-10-14 DIAGNOSIS — Z8673 Personal history of transient ischemic attack (TIA), and cerebral infarction without residual deficits: Secondary | ICD-10-CM | POA: Diagnosis not present

## 2014-10-14 DIAGNOSIS — W2209XA Striking against other stationary object, initial encounter: Secondary | ICD-10-CM | POA: Diagnosis not present

## 2014-10-14 MED ORDER — ACETAMINOPHEN 500 MG PO TABS
1000.0000 mg | ORAL_TABLET | Freq: Once | ORAL | Status: AC
  Administered 2014-10-14: 1000 mg via ORAL
  Filled 2014-10-14: qty 2

## 2014-10-14 MED ORDER — ONDANSETRON 4 MG PO TBDP
4.0000 mg | ORAL_TABLET | Freq: Once | ORAL | Status: AC
  Administered 2014-10-14: 4 mg via ORAL
  Filled 2014-10-14: qty 1

## 2014-10-14 NOTE — Discharge Instructions (Signed)
If you were given medicines take as directed.  If you are on coumadin or contraceptives realize their levels and effectiveness is altered by many different medicines.  If you have any reaction (rash, tongues swelling, other) to the medicines stop taking and see a physician.   Please follow up as directed and return to the ER or see a physician for new or worsening symptoms.  Thank you. Filed Vitals:   10/14/14 2000 10/14/14 2100 10/14/14 2130 10/14/14 2200  BP: 120/66 106/57 116/61 111/71  Pulse: 61 57 66 54  Temp:      TempSrc:      Resp:      SpO2: 99% 96% 97% 97%

## 2014-10-14 NOTE — ED Notes (Signed)
Pt in a gown and hooked up to monitor with 5 lead, BP cuff and pulse ox

## 2014-10-14 NOTE — ED Provider Notes (Signed)
CSN: [id number]     Arrival date & time 10/14/14  1936 History   First MD Initiated Contact with Patient 10/14/14 2025     Chief Complaint  Patient presents with  . Head Injury     (Consider location/radiation/quality/duration/timing/severity/associated sxs/prior Treatment) HPI Comments: 54 year old male with history of COPD, diabetes, stroke, carotid artery occlusion, intracerebral hemorrhage, on Plavix presents with head injury. Patient hit his head on vehicle door prior to arrival, no loss of consciousness, no vomiting. Patient has no neurologic complaints  Patient is a 54 y.o. male presenting with head injury. The history is provided by the patient.  Head Injury Associated symptoms: headache   Associated symptoms: no neck pain and no vomiting     Past Medical History  Diagnosis Date  . Asthma     Last asthma attack at age 45; History of trach at 16 months  . COPD (chronic obstructive pulmonary disease) 06/2012    Dx by Zoe Lan[facility]  . Headache(784.0)   . Diabetes mellitus without complication   . Stroke     2015  . Hypertension   . GERD (gastroesophageal reflux disease)   . H/O hiatal hernia    Past Surgical History  Procedure Laterality Date  . Tracheostomy closure    . Tracheostomy    . Fracture surgery      NO HX of Broken Bone.  Has rotator cuff tear repair and shoulder/clavicle reconstruction  . Joint replacement      NO HX of JOINT REPLACEMENT.  Rotator cuff tear repair and shoulder reconstruction  . Rectal surgery    . Rotator cuff repair    . Radiology with anesthesia N/A 08/08/2014    Procedure: RADIOLOGY WITH ANESTHESIA EMBOLIZATION;  Surgeon: Medication Radiologist, MD;  Location: [facility] OR;  Service: Radiology;  Laterality: N/A;   Family History  Problem Relation Age of Onset  . Heart disease Mother     s/p 3V CABG  . Cancer Father 55    lung cancer; +tobacco  . Cancer Maternal Grandmother   . Heart disease Maternal  Grandmother   . Cancer Paternal Grandmother   . Lupus Sister   . Multiple sclerosis Sister   . Anemia Daughter   . Stroke Father    History  Substance Use Topics  . Smoking status: Former Smoker -- 1.00 packs/day    Types: Cigarettes    Start date: 01/15/1975    Quit date: 06/03/2014  . Smokeless tobacco: Never Used     Comment: down from 2 ppd; has used an e-cigarette  . Alcohol Use: No    Review of Systems  Constitutional: Negative for fever and chills.  Eyes: Negative for visual disturbance.  Respiratory: Negative for shortness of breath.   Cardiovascular: Negative for chest pain.  Gastrointestinal: Negative for vomiting and abdominal pain.  Genitourinary: Negative for dysuria and flank pain.  Musculoskeletal: Negative for back pain, neck pain and neck stiffness.  Skin: Negative for rash.  Neurological: Positive for headaches. Negative for syncope, weakness and light-headedness.      Allergies  Tylox  Home Medications   Prior to Admission medications   Medication Sig Start Date End Date Taking? Authorizing Provider  albuterol (PROVENTIL HFA;VENTOLIN HFA) 108 (90 BASE) MCG/ACT inhaler Inhale 1 puff into the lungs every 6 (six) hours as needed for wheezing or shortness of breath.   Yes Historical Provider, MD  aspirin 81 MG tablet Take 81 mg by mouth daily.   Yes Historical Provider,  MD  atorvastatin (LIPITOR) 20 MG tablet Take 1 tablet (20 mg total) by mouth daily at 6 PM. 07/06/14  Yes Calvert Cantor, MD  benzonatate (TESSALON) 100 MG capsule Take 1 capsule (100 mg total) by mouth every 8 (eight) hours. 10/02/14  Yes Monte Fantasia, PA-C  clopidogrel (PLAVIX) 75 MG tablet Take 0.5 tablets (37.5 mg total) by mouth daily. Patient taking differently: Take 37.5 mg by mouth every 3 (three) days.  08/14/14  Yes Jeralyn Bennett, MD  metFORMIN (GLUCOPHAGE) 500 MG tablet Take 1 tablet (500 mg total) by mouth 2 (two) times daily with a meal. 07/06/14  Yes Calvert Cantor, MD  ramipril  (ALTACE) 2.5 MG capsule Take 2.5 mg by mouth daily.   Yes Historical Provider, MD  tiotropium (SPIRIVA HANDIHALER) 18 MCG inhalation capsule Place 1 capsule (18 mcg total) into inhaler and inhale every morning. Patient taking differently: Place 18 mcg into inhaler and inhale as needed (for shortness of breath).  08/14/14  Yes Jeralyn Bennett, MD  doxycycline (VIBRAMYCIN) 100 MG capsule Take 1 capsule (100 mg total) by mouth 2 (two) times daily. One po bid x 7 days Patient not taking: Reported on 10/14/2014 10/02/14   Monte Fantasia, PA-C  nitroGLYCERIN (NITROSTAT) 0.4 MG SL tablet Place 1 tablet (0.4 mg total) under the tongue every 5 (five) minutes x 3 doses as needed for chest pain. 07/08/14   Ricki Rodriguez, MD  predniSONE (DELTASONE) 20 MG tablet Take 2 tablets (40 mg total) by mouth daily with breakfast. Patient not taking: Reported on 10/14/2014 10/02/14   Monte Fantasia, PA-C   BP 111/71 mmHg  Pulse 54  Temp(Src) 97.7 F (36.5 C) (Oral)  Resp 16  SpO2 97% Physical Exam  Constitutional: He is oriented to person, place, and time. He appears well-developed and well-nourished.  HENT:  Head: Normocephalic.  Mild dried blood right nostril, no active bleeding, septal side no hematoma  Eyes: Conjunctivae are normal. Right eye exhibits no discharge. Left eye exhibits no discharge.  Neck: Normal range of motion. Neck supple. No tracheal deviation present.  Cardiovascular: Normal rate and regular rhythm.   Pulmonary/Chest: Effort normal and breath sounds normal.  Abdominal: Soft. He exhibits no distension. There is no tenderness. There is no guarding.  Musculoskeletal: He exhibits no edema.  No midline cervical tenderness full range of motion.  Neurological: He is alert and oriented to person, place, and time. GCS eye subscore is 4. GCS verbal subscore is 5. GCS motor subscore is 6.  5+ strength in UE and LE with f/e at major joints. Sensation to palpation intact in UE and LE. CNs 2-12 grossly  intact.  EOMFI.  PERRL.   Finger nose and coordination intact bilateral.   Visual fields intact to finger testing.   Skin: Skin is warm. No rash noted.  Psychiatric: He has a normal mood and affect.  Nursing note and vitals reviewed.   ED Course  Procedures (including critical care time) Labs Review Labs Reviewed - No data to display  Imaging Review Ct Head Wo Contrast  10/14/2014   CLINICAL DATA:  Hit right side of head on car. No loss of consciousness. Acute onset of right-sided epistaxis. Initial encounter.  EXAM: CT HEAD WITHOUT CONTRAST  TECHNIQUE: Contiguous axial images were obtained from the base of the skull through the vertex without intravenous contrast.  COMPARISON:  CT of the head performed 09/20/2014  FINDINGS: There is no evidence of acute infarction, mass lesion, or intra- or extra-axial  hemorrhage on CT.  Chronic encephalomalacia is again noted at the right frontoparietal region, with involvement of the right basal ganglia, reflecting remote infarct. The patient is status post left-sided aneurysm coiling, with associated clips.  The posterior fossa, including the cerebellum, brainstem and fourth ventricle, is within normal limits. The third and lateral ventricles are unremarkable in appearance. No mass effect or midline shift is seen.  There is no evidence of fracture; visualized osseous structures are unremarkable in appearance. The orbits are within normal limits. The paranasal sinuses and mastoid air cells are well-aerated. No significant soft tissue abnormalities are seen.  IMPRESSION: 1. No evidence of traumatic intracranial injury or fracture. 2. Chronic encephalomalacia at the right frontoparietal region, with involvement of the right basal ganglia, reflecting remote infarct. 3. Status post left-sided aneurysm clipping, with associated clips.   Electronically Signed   By: Roanna Raider M.D.   On: 10/14/2014 22:45     EKG Interpretation None      MDM   Final  diagnoses:  Acute head injury  epistaxis  Patient presents with low risk head injury however patient on Plavix and has had intracerebral hemorrhage in the past. CT head ordered no acute bleeding. Discussed outpatient follow-up  Patient has mild dried blood right nare no active bleeding.  Results and differential diagnosis were discussed with the patient/parent/guardian. Close follow up outpatient was discussed, comfortable with the plan.   Medications  acetaminophen (TYLENOL) tablet 1,000 mg (1,000 mg Oral Given 10/14/14 2123)  ondansetron (ZOFRAN-ODT) disintegrating tablet 4 mg (4 mg Oral Given 10/14/14 2123)    Filed Vitals:   10/14/14 2000 10/14/14 2100 10/14/14 2130 10/14/14 2200  BP: 120/66 106/57 116/61 111/71  Pulse: 61 57 66 54  Temp:      TempSrc:      Resp:      SpO2: 99% 96% 97% 97%    Final diagnoses:  Acute head injury       Enid Skeens, MD 10/14/14 2318

## 2014-10-14 NOTE — ED Notes (Signed)
Pt to CT at this time.

## 2014-10-14 NOTE — ED Notes (Addendum)
Pt states he hit R side of head on car when he was getting in approx 1 hour ago.  States approx 25 min later his R nostril started bleeding small amount (not dripping).  Reports history of stents on L side of brain in October.  Reports he is suppose to have angiogram on R side with coil and stents this month.  Denies LOC.  Reports warm sensation since hitting head that radiates from neck down both sides to legs.

## 2014-10-15 ENCOUNTER — Telehealth: Payer: Self-pay | Admitting: Neurology

## 2014-10-15 NOTE — Telephone Encounter (Signed)
Left pt. A message to return my call regarding the study trial. °

## 2014-10-16 ENCOUNTER — Telehealth: Payer: Self-pay | Admitting: Neurology

## 2014-10-16 NOTE — Telephone Encounter (Signed)
Patient was returning my call regarding the [name] Research Trial. Visit 5 was scheduled for [id number] at 16:00h.

## 2014-10-17 ENCOUNTER — Telehealth (HOSPITAL_COMMUNITY): Payer: Self-pay | Admitting: Interventional Radiology

## 2014-10-17 ENCOUNTER — Ambulatory Visit (INDEPENDENT_AMBULATORY_CARE_PROVIDER_SITE_OTHER): Payer: Self-pay | Admitting: Neurology

## 2014-10-17 DIAGNOSIS — R202 Paresthesia of skin: Secondary | ICD-10-CM

## 2014-10-17 DIAGNOSIS — M79609 Pain in unspecified limb: Secondary | ICD-10-CM

## 2014-10-17 NOTE — Telephone Encounter (Signed)
Pt called to say that he is having a new sx. He states that he feels a warm sensation in his lower back that feels like warm running water and that this slowly radiates down the back of both of his legs to his ankles. He states that once this feeling reaches his ankle it a feeling of very hot water. I told him that I would pass this information on [facility] and get back to him.  I also told him that his recent P2Y12 blood test result was back and that per Deveshwar he need to change his medications. He was instructed to stop taking Plavix and to stop the Aspirin 81mg  and to start 1 Aspirin 325mg  1 QD only. He states understanding and is in agreement with this plan of care. JM

## 2014-10-17 NOTE — Progress Notes (Signed)
Patient is seen today for the 90 day end of study [name]   trial visit. He had discontinued the study medication as he underwent elective aneurysm treatment by Dr. Corliss Skainseveshwar. He plans to have a second aneurysm coiled at the end of January. He has not had any recurrent stroke or TIA symptoms. He does have a new complaint of intermittent anesthesias starting in his low back and radiating down the back of his thigh and leg up to the ankle. This lasted variably from 2-10 minutes and may be brought on by physical exertion. He denies any tingling or pain in his feet. He has no history of prior back pain or disc problems. No recent fall or injury. Physical exam fairly unremarkable except for mild obesity. Lower extremity pulses are poorly felt at both ankles. Neurological exam unremarkable. No focal deficits. NIH stroke scale 0. Modified Rankin scale 1  Plan : Continue aspirin for stroke prevention and add Plavix after aneurysm treatment for 3 months. Maintain strict control of hypertension and lipids. Check lower extremity arterial Dopplers for peripheral vascular disease and EMG nerve conduction study for radiculopathy/neuropathy. If both of the above tests are unremarkable may consider doing MRI scan of the lumbar spine. Return for follow-up to see me in 2-3 months or call earlier if necessary

## 2014-10-18 ENCOUNTER — Other Ambulatory Visit (HOSPITAL_COMMUNITY): Payer: Self-pay | Admitting: Neurology

## 2014-10-18 ENCOUNTER — Other Ambulatory Visit (HOSPITAL_COMMUNITY): Payer: Self-pay | Admitting: Interventional Radiology

## 2014-10-18 DIAGNOSIS — R202 Paresthesia of skin: Secondary | ICD-10-CM

## 2014-10-18 DIAGNOSIS — I771 Stricture of artery: Secondary | ICD-10-CM

## 2014-10-18 DIAGNOSIS — M79604 Pain in right leg: Secondary | ICD-10-CM

## 2014-10-19 ENCOUNTER — Ambulatory Visit (HOSPITAL_COMMUNITY)
Admission: RE | Admit: 2014-10-19 | Discharge: 2014-10-19 | Disposition: A | Discharge status: Home / Self Care | Payer: Medicaid Other | Source: Ambulatory Visit | Attending: Neurology | Admitting: Neurology

## 2014-10-19 ENCOUNTER — Ambulatory Visit (HOSPITAL_COMMUNITY): Payer: Medicaid Other

## 2014-10-19 DIAGNOSIS — R202 Paresthesia of skin: Secondary | ICD-10-CM

## 2014-10-19 DIAGNOSIS — R200 Anesthesia of skin: Secondary | ICD-10-CM | POA: Insufficient documentation

## 2014-10-19 NOTE — Progress Notes (Signed)
VASCULAR LAB PRELIMINARY  PRELIMINARY  PRELIMINARY  PRELIMINARY  Lower extremity arterial doppler right completed.  (ABI w and w/o exercise)  Preliminary report:  ABI's within normal limits.  POst exercise ABI's obtained due to symptoms of claudication.  Redmond Pullingltzroth, Heath Badon F, RVT 10/19/2014, 1:33 PM

## 2014-10-30 ENCOUNTER — Ambulatory Visit (INDEPENDENT_AMBULATORY_CARE_PROVIDER_SITE_OTHER): Payer: Self-pay | Admitting: Neurology

## 2014-10-30 ENCOUNTER — Ambulatory Visit (INDEPENDENT_AMBULATORY_CARE_PROVIDER_SITE_OTHER): Payer: Medicaid Other | Admitting: Neurology

## 2014-10-30 DIAGNOSIS — R202 Paresthesia of skin: Secondary | ICD-10-CM

## 2014-10-30 DIAGNOSIS — M79609 Pain in unspecified limb: Secondary | ICD-10-CM

## 2014-10-30 DIAGNOSIS — G609 Hereditary and idiopathic neuropathy, unspecified: Secondary | ICD-10-CM

## 2014-10-30 NOTE — Progress Notes (Signed)
  [facility]    Provider:  Dr Bobetta Korf Referring Provider: Bouska, [name], MD Primary Care Physician:  BOUSKA,[name], MD  History:  [name] is a 53 y.o. male here as a referral for leg pain. Patient reports the symptoms started 2 months ago. He is having lshooting/warm sensations down the back of both legs. Worse with long periods of sitting. Happens less often with walking. If he has the symptoms while walking, he can just stand there and the symptoms go away.  Denies LBP, the symptoms start in the buttocks and radiate down the back of the thighs to the lateral side of the lower legs and sometimes to the ankles. Both legs are the symmetric. Patient stopped Plavix a week ago and is only taking Aspirin. Discussed bleeding risks.   Summary: Nerve conduction studies were performed on the bilateral lower extremities.   Bilateral Peroneal and Tibial motor conductions were within normal limits with normal F Wave latencies.  Bilateral Sural and bilateral Superficial Peroneal sensory conductions were within normal limits  Bilateral H Reflexes were within normal limits.   EMG needle study was performed on selected left lower extremity muscles and bilateral paraspinal muscles. The Vastus Medialis, Anterior Tibialis, Flexor Digitorum Longus, Peroneus Longus,  Medial Gastrocnemius, Extensor Hallucis Longus, Gluteus Maximus, Biceps Femoris (long and short heads), Gluteus Medius muscles were within normal limits. The bilateral L4/L5/S1 paraspinal muscles were within normal limits.   Conclusion: This is a normal study. There is no electrophysiologic evidence for radiculopathy or polyneuropathy. Clinical correlation suggested.   [name], MD  [facility] [address] [place], [place] [address]  Phone [phone] Fax [phone] 

## 2014-10-31 ENCOUNTER — Other Ambulatory Visit: Payer: Self-pay | Admitting: Radiology

## 2014-10-31 NOTE — Procedures (Signed)
  [facility]    Provider:  Dr Lucia GaskinsAhern Referring Provider: Aura [name], Ivan E, MD Primary Care Physician:  [name],Ivan E, MD  History:  [name] is a 54 y.o. male here as a referral for leg pain. Patient reports the symptoms started 2 months ago. He is having lshooting/warm sensations down the back of both legs. Worse with long periods of sitting. Happens less often with walking. If he has the symptoms while walking, he can just stand there and the symptoms go away.  Denies LBP, the symptoms start in the buttocks and radiate down the back of the thighs to the lateral side of the lower legs and sometimes to the ankles. Both legs are the symmetric. Patient stopped Plavix a week ago and is only taking Aspirin. Discussed bleeding risks.   Summary: Nerve conduction studies were performed on the bilateral lower extremities.   Bilateral Peroneal and Tibial motor conductions were within normal limits with normal F Wave latencies.  Bilateral Sural and bilateral Superficial Peroneal sensory conductions were within normal limits  Bilateral H Reflexes were within normal limits.   EMG needle study was performed on selected left lower extremity muscles and bilateral paraspinal muscles. The Vastus Medialis, Anterior Tibialis, Flexor Digitorum Longus, Peroneus Longus,  Medial Gastrocnemius, Extensor Hallucis Longus, Gluteus Maximus, Biceps Femoris (long and short heads), Gluteus Medius muscles were within normal limits. The bilateral L4/L5/S1 paraspinal muscles were within normal limits.   Conclusion: This is a normal study. There is no electrophysiologic evidence for radiculopathy or polyneuropathy. Clinical correlation suggested.   Naomie DeanAntonia Ahern, MD  [facility] [address] [place], [place] [id number]  Phone [phone] Fax [phone]

## 2014-11-01 ENCOUNTER — Telehealth: Payer: Self-pay | Admitting: Neurology

## 2014-11-01 NOTE — Telephone Encounter (Signed)
Patient is calling to advise that the appointment for 1-27 with Dr. Roda ShuttersXu has been changed to 2-23 because patient is having surgery on 1-27 to have stents put in for an aneurysm. If there are any questions please call patient. Thank you.

## 2014-11-02 NOTE — Progress Notes (Signed)
I agree with the above plan 

## 2014-11-05 ENCOUNTER — Encounter (HOSPITAL_COMMUNITY): Payer: Self-pay

## 2014-11-05 ENCOUNTER — Encounter (HOSPITAL_COMMUNITY)
Admission: RE | Admit: 2014-11-05 | Discharge: 2014-11-05 | Disposition: A | Discharge status: Home / Self Care | Payer: Medicaid Other | Source: Ambulatory Visit | Attending: Interventional Radiology | Admitting: Interventional Radiology

## 2014-11-05 ENCOUNTER — Encounter (HOSPITAL_COMMUNITY): Payer: Self-pay | Admitting: Emergency Medicine

## 2014-11-05 ENCOUNTER — Encounter (HOSPITAL_COMMUNITY): Payer: Self-pay | Admitting: Anesthesiology

## 2014-11-05 DIAGNOSIS — Z01812 Encounter for preprocedural laboratory examination: Secondary | ICD-10-CM | POA: Insufficient documentation

## 2014-11-05 DIAGNOSIS — Z539 Procedure and treatment not carried out, unspecified reason: Secondary | ICD-10-CM | POA: Diagnosis not present

## 2014-11-05 DIAGNOSIS — I6521 Occlusion and stenosis of right carotid artery: Secondary | ICD-10-CM | POA: Insufficient documentation

## 2014-11-05 DIAGNOSIS — I6529 Occlusion and stenosis of unspecified carotid artery: Secondary | ICD-10-CM | POA: Diagnosis not present

## 2014-11-05 HISTORY — DX: Pain in unspecified joint: M25.50

## 2014-11-05 HISTORY — DX: Nocturia: R35.1

## 2014-11-05 HISTORY — DX: Anesthesia of skin: R20.0

## 2014-11-05 HISTORY — DX: Aneurysm of unspecified site: I72.9

## 2014-11-05 HISTORY — DX: Chronic kidney disease, unspecified: N18.9

## 2014-11-05 HISTORY — DX: Unspecified osteoarthritis, unspecified site: M19.90

## 2014-11-05 HISTORY — DX: Hyperlipidemia, unspecified: E78.5

## 2014-11-05 HISTORY — DX: Pneumonia, unspecified organism: J18.9

## 2014-11-05 HISTORY — DX: Bipolar disorder, unspecified: F31.9

## 2014-11-05 HISTORY — DX: Umbilical hernia without obstruction or gangrene: K42.9

## 2014-11-05 HISTORY — DX: Pain, unspecified: R52

## 2014-11-05 HISTORY — DX: Effusion, unspecified joint: M25.40

## 2014-11-05 LAB — COMPREHENSIVE METABOLIC PANEL
ALBUMIN: 4.1 g/dL (ref 3.5–5.2)
ALK PHOS: 72 U/L (ref 39–117)
ALT: 25 U/L (ref 0–53)
ANION GAP: 7 (ref 5–15)
AST: 21 U/L (ref 0–37)
BUN: 12 mg/dL (ref 6–23)
CO2: 27 mmol/L (ref 19–32)
CREATININE: 0.71 mg/dL (ref 0.50–1.35)
Calcium: 9.4 mg/dL (ref 8.4–10.5)
Chloride: 104 mmol/L (ref 96–112)
GFR calc Af Amer: >90 mL/min (ref >90)
Glucose, Bld: 102 mg/dL — ABNORMAL HIGH (ref 70–99)
POTASSIUM: 4.1 mmol/L (ref 3.5–5.1)
Sodium: 138 mmol/L (ref 135–145)
Total Bilirubin: 0.5 mg/dL (ref 0.3–1.2)
Total Protein: 7.4 g/dL (ref 6.0–8.3)

## 2014-11-05 LAB — PROTIME-INR
INR: 1.14 (ref 0.00–1.49)
Prothrombin Time: 14.7 seconds (ref 11.6–15.2)

## 2014-11-05 LAB — CBC WITH DIFFERENTIAL/PLATELET
BASOS ABS: 0 10*3/uL (ref 0.0–0.1)
Basophils Relative: 0 % (ref 0–1)
Eosinophils Absolute: 0.2 10*3/uL (ref 0.0–0.7)
Eosinophils Relative: 4 % (ref 0–5)
HCT: 42.6 % (ref 39.0–52.0)
HEMOGLOBIN: 14.6 g/dL (ref 13.0–17.0)
LYMPHS PCT: 32 % (ref 12–46)
Lymphs Abs: 2.1 10*3/uL (ref 0.7–4.0)
MCH: 29.9 pg (ref 26.0–34.0)
MCHC: 34.3 g/dL (ref 30.0–36.0)
MCV: 87.3 fL (ref 78.0–100.0)
Monocytes Absolute: 0.5 10*3/uL (ref 0.1–1.0)
Monocytes Relative: 8 % (ref 3–12)
NEUTROS PCT: 56 % (ref 43–77)
Neutro Abs: 3.7 10*3/uL (ref 1.7–7.7)
Platelets: 246 10*3/uL (ref 150–400)
RBC: 4.88 MIL/uL (ref 4.22–5.81)
RDW: 12.1 % (ref 11.5–15.5)
WBC: 6.6 10*3/uL (ref 4.0–10.5)

## 2014-11-05 LAB — HEMOGLOBIN A1C
HEMOGLOBIN A1C: 5.4 % (ref <5.7)
MEAN PLASMA GLUCOSE: 108 mg/dL (ref <117)

## 2014-11-05 LAB — APTT: aPTT: 31 seconds (ref 24–37)

## 2014-11-05 LAB — PLATELET INHIBITION P2Y12: Platelet Function  P2Y12: 5 [PRU] — ABNORMAL LOW (ref 194–418)

## 2014-11-05 LAB — GLUCOSE, CAPILLARY: Glucose-Capillary: 114 mg/dL — ABNORMAL HIGH (ref 70–99)

## 2014-11-05 NOTE — Telephone Encounter (Signed)
Patient called back about test results. Patient stated [name] can call him after 3:30. Stated to patient [name] will call him back in between patient's

## 2014-11-05 NOTE — Progress Notes (Signed)
   11/05/14 0837  OBSTRUCTIVE SLEEP APNEA  Have you ever been diagnosed with sleep apnea through a sleep study? No  Do you snore loudly (loud enough to be heard through closed doors)?  1  Do you often feel tired, fatigued, or sleepy during the daytime? 0  Has anyone observed you stop breathing during your sleep? 1  Do you have, or are you being treated for high blood pressure? 1  BMI more than 35 kg/m2? 1  Age over 53 years old? 1  Neck circumference greater than 40 cm/16 inches? 1 (18.5)  Gender: 1  Obstructive Sleep Apnea Score 7  Score 4 or greater  Results sent to PCP

## 2014-11-05 NOTE — Pre-Procedure Instructions (Signed)
Ivan ClontsRichard L [name]  11/05/2014   Your procedure is scheduled on:  Wed, Jan 27 @ 8:30 AM  [facility] 6:30 AM.  Call this number if you have problems the morning of surgery: [phone]   Remember:   Do not eat food or drink liquids after midnight.   Take these medicines the morning of surgery with A SIP OF WATER: Albuterol<Bring Your Inhaler With You>,Plavix(Clopidogrel),and Spiriva.              No Goody's,BC's,Aleve,Ibuprofen,Fish Oil,or any Herbal Medications   Do not wear jewelry  Do not wear lotions, powders, or colognes. You may wear deodorant.  Men may shave face and neck.  Do not bring valuables to the hospital.  [facility] not responsible                  for any belongings or valuables.               Contacts, dentures or bridgework may not be worn into surgery.  Leave suitcase in the car. After surgery it may be brought to your room.  For patients admitted to the hospital, discharge time is determined by your                treatment team.                Special Instructions:  [place] - Preparing for Surgery  Before surgery, you can play an important role.  Because skin is not sterile, your skin needs to be as free of germs as possible.  You can reduce the number of germs on you skin by washing with CHG (chlorahexidine gluconate) soap before surgery.  CHG is an antiseptic cleaner which kills germs and bonds with the skin to continue killing germs even after washing.  Please DO NOT use if you have an allergy to CHG or antibacterial soaps.  If your skin becomes reddened/irritated stop using the CHG and inform your nurse when you arrive at Short Stay.  Do not shave (including legs and underarms) for at least 48 hours prior to the first CHG shower.  You may shave your face.  Please follow these instructions carefully:   1.  Shower with CHG Soap the night before surgery and the                                morning of Surgery.  2.  If you choose  to wash your hair, wash your hair first as usual with your       normal shampoo.  3.  After you shampoo, rinse your hair and body thoroughly to remove the                      Shampoo.  4.  Use CHG as you would any other liquid soap.  You can apply chg directly       to the skin and wash gently with scrungie or a clean washcloth.  5.  Apply the CHG Soap to your body ONLY FROM THE NECK DOWN.        Do not use on open wounds or open sores.  Avoid contact with your eyes,       ears, mouth and genitals (private parts).  Wash genitals (private parts)       with your normal soap.  6.  Wash thoroughly,  paying special attention to the area where your surgery        will be performed.  7.  Thoroughly rinse your body with warm water from the neck down.  8.  DO NOT shower/wash with your normal soap after using and rinsing off       the CHG Soap.  9.  Pat yourself dry with a clean towel.            10.  Wear clean pajamas.            11.  Place clean sheets on your bed the night of your first shower and do not        sleep with pets.  Day of Surgery  Do not apply any lotions/deoderants the morning of surgery.  Please wear clean clothes to the hospital/surgery center.     Please read over the following fact sheets that you were given: Pain Booklet, Coughing and Deep Breathing and Surgical Site Infection Prevention

## 2014-11-05 NOTE — Progress Notes (Signed)
Anesthesia Chart Review:  Pt is 54 year old male scheduled for R carotid stent assisted angioplasty on 11/07/2014 with Dr. Corliss Skainseveshwar.   PMH includes: stroke (06/2014), HTN, DM, COPD, asthma, hyperlipidemia, CKD. BMI 35. Former smoker.   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 He underwent elective coiling with stent placement on 08/08/2014 (they also found a R MCA aneurysm during the procedure). 08/11/2014 MRI for symptoms showed hemorrhagic transformation of right MCA territory infarct, but this did not appear to be acute. TIA was suspected.    Medications include ASA, plavix. Pt is to continue these during surgery.  Preoperative labs reviewed.    Chest x-ray 10/02/2014 reviewed. No edema or consolidation  EKG 10/03/2014: NSR. Incomplete RBBB. Minimal voltage criteria for LVH, may be normal variant. Nonspecific T wave abnormality.   Echo 07/05/2014: - Left ventricle: The cavity size was normal. There was mild concentric hypertrophy. Systolic function was normal. The estimated ejection fraction was in the range of 55% to 60%. Images were inadequate for LV wall motion assessment. - Left atrium: The atrium was mildly to moderately dilated.  Nuclear stress test 02/03/2010: 1. No areas of reversibility to suggest inducible ischemia. 2. Mild global hypokinesis. 3. Ejection fraction estimated at 43%.  Reviewed EKG with Dr. Katrinka BlazingSmith.   If no changes, I anticipate pt can proceed with surgery as scheduled.   Rica Mastngela Philip Kotlyar, FNP-BC [facility] Phone: [phone] 11/05/2014 3:52 PM

## 2014-11-05 NOTE — Progress Notes (Addendum)
Echo report in epic from 2015  Stress test in epic from 2011  EKG and CXR in epic from 10-02-14  Medical Md is Dr.David Bouska  Cardiologist is Dr.Harwani and next visit is in April 2016(last saw in Oct)-request last offie visit

## 2014-11-05 NOTE — Telephone Encounter (Signed)
Called patient and LVM to call back to get test results.

## 2014-11-05 NOTE — Telephone Encounter (Signed)
[phone]. Patient calling you back for results.

## 2014-11-06 DIAGNOSIS — Z0289 Encounter for other administrative examinations: Secondary | ICD-10-CM

## 2014-11-06 NOTE — Progress Notes (Signed)
See procedure note.

## 2014-11-07 ENCOUNTER — Encounter (HOSPITAL_COMMUNITY)
Admission: RE | Disposition: A | Discharge status: Home / Self Care | Payer: Self-pay | Source: Ambulatory Visit | Attending: Interventional Radiology

## 2014-11-07 ENCOUNTER — Ambulatory Visit (HOSPITAL_COMMUNITY)
Admission: RE | Admit: 2014-11-07 | Discharge: 2014-11-07 | Disposition: A | Discharge status: Home / Self Care | Payer: Medicaid Other | Source: Ambulatory Visit | Attending: Interventional Radiology | Admitting: Interventional Radiology

## 2014-11-07 ENCOUNTER — Encounter (HOSPITAL_COMMUNITY): Payer: Self-pay | Admitting: Anesthesiology

## 2014-11-07 ENCOUNTER — Ambulatory Visit: Payer: Medicaid Other | Admitting: Neurology

## 2014-11-07 DIAGNOSIS — I6529 Occlusion and stenosis of unspecified carotid artery: Secondary | ICD-10-CM | POA: Diagnosis not present

## 2014-11-07 DIAGNOSIS — Z539 Procedure and treatment not carried out, unspecified reason: Secondary | ICD-10-CM

## 2014-11-07 LAB — GLUCOSE, CAPILLARY: Glucose-Capillary: 97 mg/dL (ref 70–99)

## 2014-11-07 SURGERY — RADIOLOGY WITH ANESTHESIA
Anesthesia: General | Laterality: Right

## 2014-11-07 MED ORDER — ASPIRIN EC 325 MG PO TBEC
325.0000 mg | DELAYED_RELEASE_TABLET | Freq: Once | ORAL | Status: DC
Start: 2014-11-07 — End: 2014-11-21
  Filled 2014-11-07: qty 1

## 2014-11-07 MED ORDER — NIMODIPINE 30 MG PO CAPS
60.0000 mg | ORAL_CAPSULE | ORAL | Status: DC
  Filled 2014-11-07: qty 2

## 2014-11-07 MED ORDER — CEFAZOLIN SODIUM-DEXTROSE 2-3 GM-% IV SOLR
2.0000 g | Freq: Once | INTRAVENOUS | Status: DC
  Filled 2014-11-07: qty 50

## 2014-11-07 MED ORDER — CLOPIDOGREL BISULFATE 75 MG PO TABS: 75.0000 mg | ORAL_TABLET | Freq: Once | ORAL | Status: DC

## 2014-11-07 MED ORDER — SODIUM CHLORIDE 0.9 % IV SOLN
Freq: Once | INTRAVENOUS | Status: DC
Start: 2014-11-07 — End: 2014-11-21

## 2014-11-07 NOTE — Telephone Encounter (Signed)
Called patient and LVM that EMG/NCS was normal and to call back with any concerns.

## 2014-11-07 NOTE — Progress Notes (Signed)
Patient ID: [name], male   DOB: 05/04/1961, 54 y.o.   MRN: [id number]   Pt was scheduled for intervention with Dr Corliss Skainseveshwar today P2y12 result of 5 11/05/14  Procedure not performed Medications have been changed to: ASA 81 mg one day                                                           Plavix 37.5 mg next day                                                           And so on.... Recheck P2y12 Mon 2/1 Poss procedure next week dependent on result.  Dr Corliss Skainseveshwar has seen and spoken to pt and family All aware and agreeable to plan

## 2014-11-07 NOTE — Anesthesia Preprocedure Evaluation (Deleted)
Anesthesia Evaluation  Patient identified by MRN, date of birth, ID band Patient awake    Reviewed: Allergy & Precautions, NPO status , Patient's Chart, lab work & pertinent test results  Airway        Dental   Pulmonary asthma , COPDformer smoker,          Cardiovascular hypertension, + Peripheral Vascular Disease     Neuro/Psych  Headaches, Bipolar Disorder TIACVA    GI/Hepatic hiatal hernia, GERD-  ,  Endo/Other  diabetesMorbid obesity  Renal/GU Renal disease     Musculoskeletal  (+) Arthritis -,   Abdominal   Peds  Hematology   Anesthesia Other Findings   Reproductive/Obstetrics                             Anesthesia Physical Anesthesia Plan  ASA: III  Anesthesia Plan: General and MAC   Post-op Pain Management:    Induction: Intravenous  Airway Management Planned: Oral ETT and Mask  Additional Equipment:   Intra-op Plan:   Post-operative Plan: Extubation in OR  Informed Consent: I have reviewed the patients History and Physical, chart, labs and discussed the procedure including the risks, benefits and alternatives for the proposed anesthesia with the patient or authorized representative who has indicated his/her understanding and acceptance.     Plan Discussed with: CRNA, Anesthesiologist and Surgeon  Anesthesia Plan Comments:         Anesthesia Quick Evaluation

## 2014-11-09 ENCOUNTER — Other Ambulatory Visit: Payer: Self-pay | Admitting: Radiology

## 2014-11-09 ENCOUNTER — Telehealth (HOSPITAL_COMMUNITY): Payer: Self-pay | Admitting: Interventional Radiology

## 2014-11-09 NOTE — Telephone Encounter (Signed)
[facility] [address] [phone] left message for prescription Plavix 75mg  1 tablet QD x30 tablets with 2 refills. Pt has been instructed to take Plavix 75mg  1 tablet QOD and Aspirin 81mg  1 QOD. Recheck P2Y12 on 11/12/14 JM

## 2014-11-12 ENCOUNTER — Other Ambulatory Visit: Payer: Self-pay | Admitting: Radiology

## 2014-11-12 ENCOUNTER — Encounter: Payer: Medicaid Other | Attending: Family Medicine

## 2014-11-12 DIAGNOSIS — Z713 Dietary counseling and surveillance: Secondary | ICD-10-CM | POA: Diagnosis not present

## 2014-11-12 DIAGNOSIS — Z6836 Body mass index (BMI) 36.0-36.9, adult: Secondary | ICD-10-CM | POA: Diagnosis not present

## 2014-11-12 LAB — PLATELET INHIBITION P2Y12: Platelet Function  P2Y12: 28 [PRU] — ABNORMAL LOW (ref 194–418)

## 2014-11-12 NOTE — Progress Notes (Signed)
Appt start time: 0900 end time:  0930.  Patient was seen on 11/12/2014 for a review of the series of three diabetes self-management courses at [facility]. The following learning objectives were met by the patient during this class:  . Reviewed blood glucose monitoring and interpretation including the recommended target ranges and Hgb A1c.  . Reviewed on carb counting, importance of regularly scheduled meals/snacks, and meal planning.  . Reviewed the effects of physical activity on glucose levels and long-term glucose control.  Recommended goal of 150 minutes of physical activity/week. . Reviewed patient medications and discussed role of medication on blood glucose and possible side effects. . Discussed strategies to manage stress, psychosocial issues, and other obstacles to diabetes management. . Encouraged moderate weight reduction to improve glucose levels.   . Reviewed short-term complications: hyper- and hypo-glycemia.  Discussed causes, symptoms, and treatment options. . Reviewed prevention, detection, and treatment of long-term complications.  Discussed the role of prolonged elevated glucose levels on body systems.  Goals:  Follow Diabetes Meal Plan as instructed  Eat 3 meals and 2 snacks, every 3-5 hrs  Limit carbohydrate intake to 45 grams carbohydrate/meal Limit carbohydrate intake to 15 grams carbohydrate/snack Add lean protein foods to meals/snacks  Monitor glucose levels as instructed by your doctor  Aim for goal of 15-30 mins of physical activity daily as tolerated  Bring food record and glucose log to your next nutrition visit

## 2014-11-13 ENCOUNTER — Encounter (HOSPITAL_COMMUNITY)
Admission: RE | Disposition: A | Discharge status: Home / Self Care | Payer: Self-pay | Source: Ambulatory Visit | Attending: Interventional Radiology

## 2014-11-13 ENCOUNTER — Ambulatory Visit (HOSPITAL_COMMUNITY): Payer: Medicaid Other | Attending: Interventional Radiology

## 2014-11-13 ENCOUNTER — Ambulatory Visit (HOSPITAL_COMMUNITY)
Admit: 2014-11-13 | Discharge: 2014-11-13 | Disposition: A | Discharge status: Home / Self Care | Payer: Medicaid Other | Source: Ambulatory Visit | Attending: Interventional Radiology | Admitting: Interventional Radiology

## 2014-11-13 ENCOUNTER — Other Ambulatory Visit: Payer: Self-pay | Admitting: Radiology

## 2014-11-13 DIAGNOSIS — Z01812 Encounter for preprocedural laboratory examination: Secondary | ICD-10-CM | POA: Insufficient documentation

## 2014-11-13 DIAGNOSIS — I771 Stricture of artery: Secondary | ICD-10-CM

## 2014-11-13 DIAGNOSIS — R0602 Shortness of breath: Secondary | ICD-10-CM

## 2014-11-13 LAB — CBC WITH DIFFERENTIAL/PLATELET
BASOS ABS: 0 10*3/uL (ref 0.0–0.1)
Basophils Relative: 0 % (ref 0–1)
Eosinophils Absolute: 0.1 10*3/uL (ref 0.0–0.7)
Eosinophils Relative: 2 % (ref 0–5)
HCT: 36.6 % — ABNORMAL LOW (ref 39.0–52.0)
Hemoglobin: 12.5 g/dL — ABNORMAL LOW (ref 13.0–17.0)
LYMPHS ABS: 2.2 10*3/uL (ref 0.7–4.0)
Lymphocytes Relative: 36 % (ref 12–46)
MCH: 29.5 pg (ref 26.0–34.0)
MCHC: 34.2 g/dL (ref 30.0–36.0)
MCV: 86.3 fL (ref 78.0–100.0)
Monocytes Absolute: 0.6 10*3/uL (ref 0.1–1.0)
Monocytes Relative: 10 % (ref 3–12)
Neutro Abs: 3.1 10*3/uL (ref 1.7–7.7)
Neutrophils Relative %: 52 % (ref 43–77)
PLATELETS: 189 10*3/uL (ref 150–400)
RBC: 4.24 MIL/uL (ref 4.22–5.81)
RDW: 12.3 % (ref 11.5–15.5)
WBC: 6 10*3/uL (ref 4.0–10.5)

## 2014-11-13 LAB — COMPREHENSIVE METABOLIC PANEL
ALK PHOS: 57 U/L (ref 39–117)
ALT: 20 U/L (ref 0–53)
AST: 17 U/L (ref 0–37)
Albumin: 3.4 g/dL — ABNORMAL LOW (ref 3.5–5.2)
Anion gap: 5 (ref 5–15)
BILIRUBIN TOTAL: 0.4 mg/dL (ref 0.3–1.2)
BUN: 9 mg/dL (ref 6–23)
CALCIUM: 8.7 mg/dL (ref 8.4–10.5)
CO2: 27 mmol/L (ref 19–32)
Chloride: 107 mmol/L (ref 96–112)
Creatinine, Ser: 0.74 mg/dL (ref 0.50–1.35)
GFR calc Af Amer: >90 mL/min (ref >90)
GFR calc non Af Amer: >90 mL/min (ref >90)
Glucose, Bld: 111 mg/dL — ABNORMAL HIGH (ref 70–99)
POTASSIUM: 4.1 mmol/L (ref 3.5–5.1)
Sodium: 139 mmol/L (ref 135–145)
Total Protein: 6.1 g/dL (ref 6.0–8.3)

## 2014-11-13 LAB — PROTIME-INR
INR: 1 (ref 0.00–1.49)
PROTHROMBIN TIME: 13.3 s (ref 11.6–15.2)

## 2014-11-13 LAB — APTT: APTT: 30 s (ref 24–37)

## 2014-11-13 LAB — GLUCOSE, CAPILLARY: Glucose-Capillary: 106 mg/dL — ABNORMAL HIGH (ref 70–99)

## 2014-11-13 LAB — PLATELET INHIBITION P2Y12: PLATELET FUNCTION P2Y12: 42 [PRU] — AB (ref 194–418)

## 2014-11-13 SURGERY — RADIOLOGY WITH ANESTHESIA
Anesthesia: Monitor Anesthesia Care

## 2014-11-13 MED ORDER — LIDOCAINE HCL 1 % IJ SOLN
INTRAMUSCULAR | Status: AC
  Filled 2014-11-13: qty 20

## 2014-11-13 MED ORDER — CEFAZOLIN SODIUM-DEXTROSE 2-3 GM-% IV SOLR
2.0000 g | Freq: Once | INTRAVENOUS | Status: DC
  Filled 2014-11-13: qty 50

## 2014-11-13 MED ORDER — ASPIRIN EC 325 MG PO TBEC
325.0000 mg | DELAYED_RELEASE_TABLET | ORAL | Status: DC
  Filled 2014-11-13: qty 1

## 2014-11-13 MED ORDER — NIMODIPINE 30 MG PO CAPS
60.0000 mg | ORAL_CAPSULE | ORAL | Status: DC
  Filled 2014-11-13: qty 2

## 2014-11-13 MED ORDER — CEFAZOLIN SODIUM-DEXTROSE 2-3 GM-% IV SOLR
INTRAVENOUS | Status: AC
  Filled 2014-11-13: qty 50

## 2014-11-13 MED ORDER — CLOPIDOGREL BISULFATE 75 MG PO TABS
75.0000 mg | ORAL_TABLET | ORAL | Status: DC
  Filled 2014-11-13: qty 1

## 2014-11-13 MED ORDER — SODIUM CHLORIDE 0.9 % IV SOLN: Freq: Once | INTRAVENOUS | Status: DC

## 2014-11-13 NOTE — Progress Notes (Signed)
NIMODIPINE HELD THIS AM (HR 60, BP 116/68).

## 2014-11-13 NOTE — Progress Notes (Signed)
Procedure cancelled for today due to P2Y12 and congestion.  Chest xray done today.

## 2014-11-13 NOTE — Progress Notes (Signed)
Per Beckey DowningPam Turpin we should give 325 aspirin, patient took 1/2 tab plavix (do not need to give more plavix, if needed more plavix can be given in radiology).

## 2014-11-13 NOTE — Progress Notes (Signed)
Patient ID: Ivan ClontsRichard L [name], male   DOB: 07/31/1961, 54 y.o.   MRN: [id number]   Pt scheduled again for Rt carotid artery angioplasty/stent this am P2y12 42 this am---was prepared to move ahead But, Pt states he became short of breath yesterday while walking back and forth to park with son yesterday. Has had productive cough of clear mucus for few days  PE:  Lungs: + wheezes throughout         Denies chest pain; N/V         No edema noted of extremities         Hg: 12.5 (14.6) Anesthesia also noted wheezes on exam Did not start IVs or art line awaiting our evaluation  Discussed with Dr Corliss Skainseveshwar Will cancel procedure today  CXR ordered Pt is to see PMD asap for evaluation/treatment  He will will from IR scheduler to reschedule after PMD ok to move forward Pt and family aware and agreeable

## 2014-11-16 ENCOUNTER — Other Ambulatory Visit: Payer: Self-pay | Admitting: Radiology

## 2014-11-19 ENCOUNTER — Other Ambulatory Visit: Payer: Self-pay | Admitting: Radiology

## 2014-11-20 ENCOUNTER — Encounter (HOSPITAL_COMMUNITY): Payer: Self-pay | Admitting: Pharmacy Technician

## 2014-11-20 ENCOUNTER — Other Ambulatory Visit: Payer: Self-pay | Admitting: Radiology

## 2014-11-20 NOTE — Anesthesia Preprocedure Evaluation (Addendum)
Anesthesia Evaluation  Patient identified by MRN, date of birth, ID band Patient awake    Reviewed: Allergy & Precautions, NPO status , Patient's Chart, lab work & pertinent test results  Airway        Dental   Pulmonary asthma , COPDformer smoker,          Cardiovascular hypertension, + Peripheral Vascular Disease     Neuro/Psych  Headaches, Bipolar Disorder TIACVA    GI/Hepatic hiatal hernia, GERD-  ,  Endo/Other  diabetesMorbid obesity  Renal/GU Renal disease     Musculoskeletal  (+) Arthritis -,   Abdominal   Peds  Hematology   Anesthesia Other Findings   Reproductive/Obstetrics                            Anesthesia Physical  Anesthesia Plan  ASA: III  Anesthesia Plan: General   Post-op Pain Management:    Induction: Intravenous  Airway Management Planned: Oral ETT  Additional Equipment:   Intra-op Plan:   Post-operative Plan: Extubation in OR  Informed Consent: I have reviewed the patients History and Physical, chart, labs and discussed the procedure including the risks, benefits and alternatives for the proposed anesthesia with the patient or authorized representative who has indicated his/her understanding and acceptance.   Dental advisory given  Plan Discussed with: CRNA  Anesthesia Plan Comments:         Anesthesia Quick Evaluation                                  Anesthesia Evaluation  Patient identified by MRN, date of birth, ID band Patient awake    Reviewed: Allergy & Precautions, NPO status , Patient's Chart, lab work & pertinent test results  Airway        Dental   Pulmonary asthma , COPDformer smoker,          Cardiovascular hypertension, + Peripheral Vascular Disease     Neuro/Psych  Headaches, Bipolar Disorder TIACVA    GI/Hepatic hiatal hernia, GERD-  ,  Endo/Other  diabetesMorbid obesity  Renal/GU Renal disease      Musculoskeletal  (+) Arthritis -,   Abdominal   Peds  Hematology   Anesthesia Other Findings   Reproductive/Obstetrics                             Anesthesia Physical Anesthesia Plan  ASA: III  Anesthesia Plan: General and MAC   Post-op Pain Management:    Induction: Intravenous  Airway Management Planned: Oral ETT and Mask  Additional Equipment:   Intra-op Plan:   Post-operative Plan: Extubation in OR  Informed Consent: I have reviewed the patients History and Physical, chart, labs and discussed the procedure including the risks, benefits and alternatives for the proposed anesthesia with the patient or authorized representative who has indicated his/her understanding and acceptance.     Plan Discussed with: CRNA, Anesthesiologist and Surgeon  Anesthesia Plan Comments:         Anesthesia Quick Evaluation

## 2014-11-21 ENCOUNTER — Encounter (HOSPITAL_COMMUNITY): Payer: Self-pay | Admitting: Certified Registered Nurse Anesthetist

## 2014-11-21 ENCOUNTER — Ambulatory Visit (HOSPITAL_COMMUNITY)
Admit: 2014-11-21 | Discharge: 2014-11-21 | Disposition: A | Discharge status: Home / Self Care | Payer: Medicaid Other | Attending: Interventional Radiology | Admitting: Interventional Radiology

## 2014-11-21 ENCOUNTER — Ambulatory Visit (HOSPITAL_COMMUNITY)
Admission: AD | Admit: 2014-11-21 | Discharge: 2014-11-21 | Disposition: A | Discharge status: Home / Self Care | Payer: Medicaid Other | Source: Ambulatory Visit | Attending: Interventional Radiology | Admitting: Interventional Radiology

## 2014-11-21 ENCOUNTER — Encounter (HOSPITAL_COMMUNITY): Payer: Self-pay | Admitting: *Deleted

## 2014-11-21 ENCOUNTER — Ambulatory Visit (HOSPITAL_COMMUNITY): Payer: Medicaid Other | Admitting: Anesthesiology

## 2014-11-21 ENCOUNTER — Encounter (HOSPITAL_COMMUNITY)
Admission: RE | Disposition: A | Discharge status: Home / Self Care | Payer: Self-pay | Source: Ambulatory Visit | Attending: Interventional Radiology

## 2014-11-21 ENCOUNTER — Encounter (HOSPITAL_COMMUNITY): Payer: Self-pay

## 2014-11-21 ENCOUNTER — Encounter (HOSPITAL_COMMUNITY)
Admission: AD | Disposition: A | Discharge status: Home / Self Care | Payer: Self-pay | Source: Ambulatory Visit | Attending: Interventional Radiology

## 2014-11-21 DIAGNOSIS — E119 Type 2 diabetes mellitus without complications: Secondary | ICD-10-CM | POA: Insufficient documentation

## 2014-11-21 DIAGNOSIS — E785 Hyperlipidemia, unspecified: Secondary | ICD-10-CM | POA: Insufficient documentation

## 2014-11-21 DIAGNOSIS — Z87891 Personal history of nicotine dependence: Secondary | ICD-10-CM | POA: Diagnosis not present

## 2014-11-21 DIAGNOSIS — I129 Hypertensive chronic kidney disease with stage 1 through stage 4 chronic kidney disease, or unspecified chronic kidney disease: Secondary | ICD-10-CM | POA: Insufficient documentation

## 2014-11-21 DIAGNOSIS — N189 Chronic kidney disease, unspecified: Secondary | ICD-10-CM | POA: Diagnosis not present

## 2014-11-21 DIAGNOSIS — Z823 Family history of stroke: Secondary | ICD-10-CM | POA: Insufficient documentation

## 2014-11-21 DIAGNOSIS — K219 Gastro-esophageal reflux disease without esophagitis: Secondary | ICD-10-CM | POA: Diagnosis not present

## 2014-11-21 DIAGNOSIS — Z8673 Personal history of transient ischemic attack (TIA), and cerebral infarction without residual deficits: Secondary | ICD-10-CM | POA: Diagnosis not present

## 2014-11-21 DIAGNOSIS — I6521 Occlusion and stenosis of right carotid artery: Secondary | ICD-10-CM | POA: Diagnosis not present

## 2014-11-21 DIAGNOSIS — I671 Cerebral aneurysm, nonruptured: Secondary | ICD-10-CM | POA: Diagnosis not present

## 2014-11-21 DIAGNOSIS — J45909 Unspecified asthma, uncomplicated: Secondary | ICD-10-CM | POA: Diagnosis not present

## 2014-11-21 DIAGNOSIS — Z7982 Long term (current) use of aspirin: Secondary | ICD-10-CM | POA: Insufficient documentation

## 2014-11-21 DIAGNOSIS — J449 Chronic obstructive pulmonary disease, unspecified: Secondary | ICD-10-CM | POA: Diagnosis not present

## 2014-11-21 DIAGNOSIS — I771 Stricture of artery: Secondary | ICD-10-CM

## 2014-11-21 HISTORY — PX: RADIOLOGY WITH ANESTHESIA: SHX6223

## 2014-11-21 HISTORY — DX: Nausea with vomiting, unspecified: R11.2

## 2014-11-21 HISTORY — DX: Other specified postprocedural states: Z98.890

## 2014-11-21 LAB — COMPREHENSIVE METABOLIC PANEL
ALT: 23 U/L (ref 0–53)
AST: 17 U/L (ref 0–37)
Albumin: 3.7 g/dL (ref 3.5–5.2)
Alkaline Phosphatase: 63 U/L (ref 39–117)
Anion gap: 9 (ref 5–15)
BUN: 11 mg/dL (ref 6–23)
CHLORIDE: 105 mmol/L (ref 96–112)
CO2: 25 mmol/L (ref 19–32)
Calcium: 9 mg/dL (ref 8.4–10.5)
Creatinine, Ser: 0.75 mg/dL (ref 0.50–1.35)
Glucose, Bld: 106 mg/dL — ABNORMAL HIGH (ref 70–99)
Potassium: 4 mmol/L (ref 3.5–5.1)
SODIUM: 139 mmol/L (ref 135–145)
Total Bilirubin: 0.6 mg/dL (ref 0.3–1.2)
Total Protein: 6.2 g/dL (ref 6.0–8.3)

## 2014-11-21 LAB — GLUCOSE, CAPILLARY
GLUCOSE-CAPILLARY: 96 mg/dL (ref 70–99)
GLUCOSE-CAPILLARY: 83 mg/dL (ref 70–99)

## 2014-11-21 LAB — CBC WITH DIFFERENTIAL/PLATELET
BASOS PCT: 0 % (ref 0–1)
Basophils Absolute: 0 10*3/uL (ref 0.0–0.1)
EOS ABS: 0.2 10*3/uL (ref 0.0–0.7)
Eosinophils Relative: 3 % (ref 0–5)
HCT: 37.4 % — ABNORMAL LOW (ref 39.0–52.0)
Hemoglobin: 12.7 g/dL — ABNORMAL LOW (ref 13.0–17.0)
Lymphocytes Relative: 39 % (ref 12–46)
Lymphs Abs: 2.2 10*3/uL (ref 0.7–4.0)
MCH: 29.5 pg (ref 26.0–34.0)
MCHC: 34 g/dL (ref 30.0–36.0)
MCV: 87 fL (ref 78.0–100.0)
Monocytes Absolute: 0.4 10*3/uL (ref 0.1–1.0)
Monocytes Relative: 8 % (ref 3–12)
NEUTROS ABS: 2.8 10*3/uL (ref 1.7–7.7)
NEUTROS PCT: 50 % (ref 43–77)
PLATELETS: 184 10*3/uL (ref 150–400)
RBC: 4.3 MIL/uL (ref 4.22–5.81)
RDW: 12.1 % (ref 11.5–15.5)
WBC: 5.5 10*3/uL (ref 4.0–10.5)

## 2014-11-21 LAB — APTT: aPTT: 29 seconds (ref 24–37)

## 2014-11-21 LAB — PROTIME-INR
INR: 1.03 (ref 0.00–1.49)
PROTHROMBIN TIME: 13.6 s (ref 11.6–15.2)

## 2014-11-21 LAB — PLATELET INHIBITION P2Y12: PLATELET FUNCTION P2Y12: 66 [PRU] — AB (ref 194–418)

## 2014-11-21 SURGERY — RADIOLOGY WITH ANESTHESIA
Anesthesia: General

## 2014-11-21 MED ORDER — LIDOCAINE HCL 1 % IJ SOLN
INTRAMUSCULAR | Status: AC
  Filled 2014-11-21: qty 20

## 2014-11-21 MED ORDER — CEFAZOLIN SODIUM-DEXTROSE 2-3 GM-% IV SOLR
2.0000 g | Freq: Once | INTRAVENOUS | Status: AC
  Administered 2014-11-21: 2 g via INTRAVENOUS

## 2014-11-21 MED ORDER — GLYCOPYRROLATE 0.2 MG/ML IJ SOLN
INTRAMUSCULAR | Status: DC | PRN
  Administered 2014-11-21 (×2): 0.2 mg via INTRAVENOUS

## 2014-11-21 MED ORDER — PROPOFOL 10 MG/ML IV BOLUS
INTRAVENOUS | Status: DC | PRN
  Administered 2014-11-21: 20 mg via INTRAVENOUS
  Administered 2014-11-21: 30 mg via INTRAVENOUS
  Administered 2014-11-21: 20 mg via INTRAVENOUS

## 2014-11-21 MED ORDER — LIDOCAINE HCL (CARDIAC) 20 MG/ML IV SOLN
INTRAVENOUS | Status: DC | PRN
  Administered 2014-11-21: 100 mg via INTRAVENOUS

## 2014-11-21 MED ORDER — NITROGLYCERIN 1 MG/10 ML FOR IR/CATH LAB
INTRA_ARTERIAL | Status: AC
  Filled 2014-11-21: qty 10

## 2014-11-21 MED ORDER — FENTANYL CITRATE 0.05 MG/ML IJ SOLN
INTRAMUSCULAR | Status: DC | PRN
  Administered 2014-11-21 (×4): 25 ug via INTRAVENOUS

## 2014-11-21 MED ORDER — SODIUM CHLORIDE 0.9 % IV SOLN
10.0000 mg | INTRAVENOUS | Status: DC | PRN
  Administered 2014-11-21: 10 ug/min via INTRAVENOUS

## 2014-11-21 MED ORDER — SODIUM CHLORIDE 0.9 % IV SOLN: Freq: Once | INTRAVENOUS | Status: DC

## 2014-11-21 MED ORDER — HEPARIN SODIUM (PORCINE) 1000 UNIT/ML IJ SOLN
INTRAMUSCULAR | Status: DC | PRN
  Administered 2014-11-21: 1000 [IU] via INTRAVENOUS

## 2014-11-21 MED ORDER — CEFAZOLIN SODIUM-DEXTROSE 2-3 GM-% IV SOLR
2.0000 g | Freq: Once | INTRAVENOUS | Status: DC
  Filled 2014-11-21: qty 50

## 2014-11-21 MED ORDER — IOHEXOL 300 MG/ML  SOLN
150.0000 mL | Freq: Once | INTRAMUSCULAR | Status: AC | PRN
  Administered 2014-11-21: 35 mL via INTRA_ARTERIAL

## 2014-11-21 MED ORDER — ONDANSETRON HCL 4 MG/2ML IJ SOLN: 4.0000 mg | Freq: Once | INTRAMUSCULAR | Status: DC | PRN

## 2014-11-21 MED ORDER — NIMODIPINE 30 MG PO CAPS
0.0000 mg | ORAL_CAPSULE | ORAL | Status: AC
  Administered 2014-11-21: 30 mg via ORAL
  Filled 2014-11-21: qty 1

## 2014-11-21 MED ORDER — NIMODIPINE 30 MG PO CAPS: 0.0000 mg | ORAL_CAPSULE | ORAL | Status: DC

## 2014-11-21 MED ORDER — PROPOFOL INFUSION 10 MG/ML OPTIME
INTRAVENOUS | Status: DC | PRN
  Administered 2014-11-21: 25 ug/kg/min via INTRAVENOUS

## 2014-11-21 MED ORDER — SODIUM CHLORIDE 0.9 % IV SOLN: INTRAVENOUS | Status: AC

## 2014-11-21 MED ORDER — LACTATED RINGERS IV SOLN
INTRAVENOUS | Status: DC | PRN
  Administered 2014-11-21: 09:00:00 via INTRAVENOUS

## 2014-11-21 MED ORDER — HYDROMORPHONE HCL 1 MG/ML IJ SOLN: 0.2500 mg | INTRAMUSCULAR | Status: DC | PRN

## 2014-11-21 NOTE — H&P (Signed)
Chief Complaint: Right carotid artery stenosis  Referring Physician(s): Deveshwar,Sanjeev K  History of Present Illness: [name] is a 54 y.o. 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 Stroke     2015  . H/O hiatal hernia   . Asthma     Last asthma attack at age 33; History of trach at 16 months  . Hypertension     takes Ramipril daily  . Hyperlipidemia     takes Ramipril daily  . Diabetes mellitus without complication     takes Metformin daily  . COPD (chronic obstructive pulmonary disease) 06/2012    uses Spiriva and Albuterol daily as needed  . Pneumonia     hx of-2014  . History of bronchitis 2014  . Headache(784.0)     "all the time"  . Aneurysm     brain  . Umbilical hernia   . Numbness     both arms   . Burning pain     in both legs-seeing Dr.Sethi for this  . Arthritis   . Joint pain   . Joint swelling   . Back pain     reason unknown   . GERD (gastroesophageal reflux disease)     was on Nexium;taking Omeprazole daily as needed  . Nocturia   . Chronic kidney disease 2009    chronic kidney disease  . Bipolar disorder     was on meds but was taken off 2 yrs ago and none since    Past Surgical History  Procedure Laterality Date  . Tracheostomy closure    . Tracheostomy      at age 51 months old  . Rectal surgery    . Rotator cuff repair Left 1996  . Radiology with anesthesia N/A 08/08/2014    Procedure: RADIOLOGY WITH ANESTHESIA EMBOLIZATION;  Surgeon: Medication Radiologist, MD;  Location: [facility] OR;  Service: Radiology;  Laterality: N/A;  . Knee arthroscopy Right   . Cyst removed from right wrist    . Angiogram with coiling    . Colonoscopy       Allergies: Tylox and Adhesive  Medications: Prior to Admission medications   Medication Sig Start Date End Date Taking? Authorizing Provider  albuterol (PROVENTIL HFA;VENTOLIN HFA) 108 (90 BASE) MCG/ACT inhaler Inhale 2 puffs into the lungs every 6 (six) hours as needed for wheezing or shortness of breath.    Yes Historical Provider, MD  aspirin EC 81 MG tablet Take 81 mg by mouth every other day.   Yes Historical Provider, MD  atorvastatin (LIPITOR) 20 MG tablet Take 1 tablet (20 mg total) by mouth daily at 6 PM. 07/06/14  Yes Calvert Cantor, MD  clopidogrel (PLAVIX) 75 MG tablet Take 0.5 tablets (37.5 mg total) by mouth daily. Patient taking differently: Take 37.5 mg by mouth every other day.  08/14/14  Yes Jeralyn Bennett, MD  esomeprazole (NEXIUM) 40 MG capsule Take 40 mg by mouth daily at 12 noon.   Yes Historical Provider, MD  metFORMIN (GLUCOPHAGE) 500 MG tablet Take 1 tablet (500 mg total) by mouth 2 (two) times daily with a meal. 07/06/14  Yes Calvert Cantor, MD  nitroGLYCERIN (NITROSTAT) 0.4 MG SL tablet Place 1 tablet (0.4 mg total)  under the tongue every 5 (five) minutes x 3 doses as needed for chest pain. 07/08/14  Yes Ricki RodriguezAjay S Kadakia, MD  ramipril (ALTACE) 2.5 MG capsule Take 2.5 mg by mouth daily.   Yes Historical Provider, MD  tiotropium (SPIRIVA HANDIHALER) 18 MCG inhalation capsule Place 1 capsule (18 mcg total) into inhaler and inhale every morning. Patient taking differently: Place 18 mcg into inhaler and inhale as needed (for shortness of breath).  08/14/14  Yes Jeralyn BennettEzequiel Zamora, MD  triamcinolone cream (KENALOG) 0.1 % Apply 1 application topically 2 (two) times daily.   Yes Historical Provider, MD    Family History  Problem Relation Age of Onset  . Heart disease Mother     s/p 3V CABG  . Cancer Father 2772    lung cancer; +tobacco  . Cancer Maternal Grandmother   . Heart disease Maternal Grandmother   . Cancer Paternal Grandmother   . Lupus Sister   . Multiple sclerosis  Sister   . Anemia Daughter   . Stroke Father     History   Social History  . Marital Status: Married    Spouse Name: not together since 2007  . Number of Children: 3  . Years of Education: 10   Occupational History  . Engineer, productionield Technician     [employer]    Social History Main Topics  . Smoking status: Former Smoker -- 1.00 packs/day    Types: Cigarettes    Start date: 01/15/1975  . Smokeless tobacco: Never Used     Comment: quit smoking in Aug 2015  . Alcohol Use: Yes     Comment: only on Wed  . Drug Use: No  . Sexual Activity:    Partners: Female     Comment: partner has had surgery to prevent pregnancy   Other Topics Concern  . None   Social History Narrative   Lives with his son and his mother.  His son has no contact with the son's mother, though the patient is not legally separated from her.  She has a history of drug use and has been in prison several times.     Review of Systems: A 12 point ROS discussed and pertinent positives are indicated in the HPI above.  All other systems are negative.  Review of Systems  Constitutional: Negative for fever, activity change, appetite change and fatigue.  HENT: Negative for tinnitus, trouble swallowing and voice change.   Respiratory: Negative for cough, chest tightness and shortness of breath.   Gastrointestinal: Negative for nausea, vomiting and abdominal pain.  Genitourinary: Negative for difficulty urinating.  Musculoskeletal: Negative for back pain.  Neurological: Negative for dizziness, tremors, seizures, syncope, facial asymmetry, speech difficulty, light-headedness, numbness and headaches.  Psychiatric/Behavioral: Negative for behavioral problems and confusion.    Vital Signs: There were no vitals taken for this visit.  Physical Exam  Constitutional: He is oriented to person, place, and time. He appears well-developed and well-nourished.  Eyes: EOM are normal.  Neck: Normal range of motion.  Cardiovascular: Normal  rate, regular rhythm and normal heart sounds.   No murmur heard. Pulmonary/Chest: Effort normal and breath sounds normal. He has no wheezes.  Abdominal: Soft. Bowel sounds are normal. He exhibits no distension. There is no tenderness.  Musculoskeletal: Normal range of motion.  Neurological: He is alert and oriented to person, place, and time.  Skin: Skin is warm and dry.  Psychiatric: He has a normal mood and affect. His behavior is normal. Judgment and thought content normal.  Nursing note and vitals reviewed.   Imaging: Dg Chest 2 View  11/13/2014   CLINICAL DATA:  Shortness of breath. Rule out pneumonia or URI before surgery  EXAM: CHEST  2 VIEW  COMPARISON:  10/02/2014  FINDINGS: Normal heart size and mediastinal contours. No acute infiltrate or edema. No effusion or pneumothorax. No acute osseous findings.  IMPRESSION: No active cardiopulmonary disease.   Electronically Signed   By: Tiburcio Pea M.D.   On: 11/13/2014 08:13    Labs:  CBC:  Recent Labs  09/20/14 1129 10/02/14 1208 11/05/14 0842 11/13/14 0703  WBC 6.5 6.6 6.6 6.0  HGB 13.1 12.7* 14.6 12.5*  HCT 38.0* 37.1* 42.6 36.6*  PLT 223 225 246 189    COAGS:  Recent Labs  07/03/14 2139 07/08/14 0125 08/02/14 1511 11/05/14 0842 11/13/14 0703  INR 0.89 1.04 1.05 1.14 1.00  APTT 30  --  BMP:  Recent Labs  09/20/14 1129 10/02/14 1208 11/05/14 0842 11/13/14 0703  NA 138 140 138 139  K 4.2 3.8 4.1 4.1  CL 101 104 104 107  CO2 GLUCOSE 107* 111* 102* 111*  BUN CALCIUM 9.5 9.2 9.4 8.7  CREATININE 0.70 0.90 0.71 0.74  GFRNONAA >90 >90 >90 >90  GFRAA >90 >90 >90 >90    LIVER FUNCTION TESTS:  Recent Labs  08/27/14 1851 10/02/14 1208 11/05/14 0842 11/13/14 0703  BILITOT 0.3 0.4 0.5 0.4  AST ALT ALKPHOS 72 74 72 57  PROT 7.5 6.4 7.4 6.1  ALBUMIN 4.1 4.0 4.1 3.4*    TUMOR MARKERS: No results for input(s): AFPTM, CEA, CA199,  CHROMGRNA in the last 8760 hours.  Assessment and Plan:  R carotid artery stenosis Scheduled for probable angioplasty/stent placement today in IR with Dr [name] Pt aware of procedure benefits and risks including but not limited to: Infection; blood loss; vessel damage; CVA Agreeable to proceed Consent signed and in chart Pt understands if intervention is performed he may be admitted to Neuro ICU overnight    Thank you for this interesting consult.  I greatly enjoyed meeting [name] and look forward to participating in their care.  Signed: Lanard Arguijo A 11/21/2014, 7:59 AM   I spent a total of 30 minutes face to face in clinical consultation, greater than 50% of which was counseling/coordinating care for R carotid artery pta/stent

## 2014-11-21 NOTE — Anesthesia Postprocedure Evaluation (Signed)
Anesthesia Post Note  Patient: [name]  Procedure(s) Performed: Procedure(s) (LRB): RADIOLOGY WITH ANESTHESIA (N/A)  Anesthesia type: MAC  Patient location: PACU  Post pain: Pain level controlled  Post assessment: Post-op Vital signs reviewed  Last Vitals: BP 111/62 mmHg  Pulse 57  Temp(Src) 36.1 C (Oral)  Resp 12  Ht 5\' 8"  (1.727 m)  Wt 231 lb 4 oz (104.894 kg)  BMI 35.17 kg/m2  SpO2 100%  Post vital signs: Reviewed  Level of consciousness: awake  Complications: No apparent anesthesia complications

## 2014-11-21 NOTE — Transfer of Care (Signed)
Immediate Anesthesia Transfer of Care Note  Patient: Ivan ClontsRichard L Nucci  Procedure(s) Performed: Procedure(s): RADIOLOGY WITH ANESTHESIA (N/A)  Patient Location: PACU  Anesthesia Type:MAC  Level of Consciousness: awake, alert , oriented and patient cooperative  Airway & Oxygen Therapy: Patient Spontanous Breathing  Post-op Assessment: Report given to RN, Post -op Vital signs reviewed and stable and Patient moving all extremities  Post vital signs: Reviewed and stable  Last Vitals:  Filed Vitals:   11/21/14 0748  BP: 127/77  Pulse: 56  Temp: 36.4 C  Resp: 18    Complications: No apparent anesthesia complications

## 2014-11-21 NOTE — Discharge Instructions (Signed)
Resume glucophage on Friday, the 12th of February  Angiogram, Care After Refer to this sheet in the next few weeks. These instructions provide you with information on caring for yourself after your procedure. Your health care provider may also give you more specific instructions. Your treatment has been planned according to current medical practices, but problems sometimes occur. Call your health care provider if you have any problems or questions after your procedure.  WHAT TO EXPECT AFTER THE PROCEDURE After your procedure, it is typical to have the following sensations:  Minor discomfort or tenderness and a small bump at the catheter insertion site. The bump should usually decrease in size and tenderness within 1 to 2 weeks.  Any bruising will usually fade within 2 to 4 weeks. HOME CARE INSTRUCTIONS   You may need to keep taking blood thinners if they were prescribed for you. Take medicines only as directed by your health care provider.  Do not apply powder or lotion to the site.  Do not take baths, swim, or use a hot tub until your health care provider approves.  You may shower 24 hours after the procedure. Remove the bandage (dressing) and gently wash the site with plain soap and water. Gently pat the site dry.  Inspect the site at least twice daily.  Limit your activity for the first 48 hours. Do not bend, squat, or lift anything over 20 lb (9 kg) or as directed by your health care provider.  Plan to have someone take you home after the procedure. Follow instructions about when you can drive or return to work. SEEK MEDICAL CARE IF:  You get light-headed when standing up.  You have drainage (other than a small amount of blood on the dressing).  You have chills.  You have a fever.  You have redness, warmth, swelling, or pain at the insertion site. SEEK IMMEDIATE MEDICAL CARE IF:   You develop chest pain or shortness of breath, feel faint, or pass out.  You have bleeding,  swelling larger than a walnut, or drainage from the catheter insertion site.  You develop pain, discoloration, coldness, or severe bruising in the leg or arm that held the catheter.  You develop bleeding from any other place, such as the bowels. You may see bright red blood in your urine or stools, or your stools may appear black and tarry.  You have heavy bleeding from the site. If this happens, hold pressure on the site. MAKE SURE YOU:  Understand these instructions.  Will watch your condition.  Will get help right away if you are not doing well or get worse. Document Released: 04/16/2005 Document Revised: 02/12/2014 Document Reviewed: 02/20/2013 [facility] Patient Information 2015 [employer] [place], [place]. This information is not intended to replace advice given to you by your health care provider. Make sure you discuss any questions you have with your health care provider.

## 2014-11-21 NOTE — Progress Notes (Signed)
Pre procedure in IR 2; pedal pulses 3t

## 2014-11-21 NOTE — Procedures (Signed)
Bilateral common carotis and RT vert angiogram. Rt CFa approach. Findings. 1Interval recanalization of RT carotid bulb,with  Filling  defects probably represending reidual clot. TR MCA aneurysm unchangedm LT MCA aneurysm obliterated. 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

## 2014-11-23 ENCOUNTER — Encounter (HOSPITAL_COMMUNITY): Payer: Self-pay | Admitting: Interventional Radiology

## 2014-12-04 ENCOUNTER — Encounter: Payer: Self-pay | Admitting: Neurology

## 2014-12-04 ENCOUNTER — Ambulatory Visit (INDEPENDENT_AMBULATORY_CARE_PROVIDER_SITE_OTHER): Payer: Medicaid Other | Admitting: Neurology

## 2014-12-04 VITALS — BP 126/77 | HR 57 | Ht 68.0 in | Wt 237.0 lb

## 2014-12-04 DIAGNOSIS — E785 Hyperlipidemia, unspecified: Secondary | ICD-10-CM

## 2014-12-04 DIAGNOSIS — I6529 Occlusion and stenosis of unspecified carotid artery: Secondary | ICD-10-CM | POA: Insufficient documentation

## 2014-12-04 DIAGNOSIS — R202 Paresthesia of skin: Secondary | ICD-10-CM | POA: Insufficient documentation

## 2014-12-04 DIAGNOSIS — I6521 Occlusion and stenosis of right carotid artery: Secondary | ICD-10-CM

## 2014-12-04 DIAGNOSIS — M79609 Pain in unspecified limb: Secondary | ICD-10-CM

## 2014-12-04 DIAGNOSIS — I671 Cerebral aneurysm, nonruptured: Secondary | ICD-10-CM

## 2014-12-04 DIAGNOSIS — E1151 Type 2 diabetes mellitus with diabetic peripheral angiopathy without gangrene: Secondary | ICD-10-CM

## 2014-12-04 DIAGNOSIS — I63131 Cerebral infarction due to embolism of right carotid artery: Secondary | ICD-10-CM

## 2014-12-04 DIAGNOSIS — E1159 Type 2 diabetes mellitus with other circulatory complications: Secondary | ICD-10-CM

## 2014-12-04 MED ORDER — GABAPENTIN 100 MG PO CAPS: 100.0000 mg | ORAL_CAPSULE | Freq: Three times a day (TID) | ORAL | Status: DC

## 2014-12-04 NOTE — Progress Notes (Signed)
STROKE NEUROLOGY FOLLOW UP NOTE  NAME: [name] DOB: 09/22/1961  REASON FOR VISIT: stroke follow up HISTORY FROM: pt and chart  Today we had the pleasure of seeing [name] in follow-up at our [facility]. Pt was accompanied by mom.   History Summary 54 year old gentleman with a past medical history of "leaky valve" followed by Dr. Sharyn LullHarwani, asthma was admitted on 07/03/14 for acute onset lightheadedness, left arm numbness as well as left facial droop and slurred speech. MRI showed right MCA stroke. 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 Strong family history of cerebral aneurysms with rupture. Recommended outpt follow up. His stroke was considered related to right ICA stenosis/occlusion, but [name] was also enrolled in [name] trial and discharged home with home PT/OT. [name] was also admitted on 07/07/14 for atypical chest pain and work up negative and d/c the next day. 08/08/14 [name] had left MCA aneurysm coiling with stenting by Dr. Corliss Skainseveshwar. 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 [name] was taken off [name] and put on ASA 81 and plavix 75mg  on discharge the next day. However, [name] was re-admitted on 08/11/14 due to transient right arm weakness and dizziness x 2 episodes. CT head showed focal small areas of hemorrhagic transformation on the right MCA infarct in the right insula which was confirmed with MRI. Right arm weakness did not appear to correlate with right-sided hemorrhagic transformation seen on MRI. There is no evidence of acute CVA on left side of brain per MRI report. Suspect his right UE weakness could have been related to TIA. Case was discussed with Dr. Corliss Skainseveshwar who initially recommended increasing aspirin to 325 mg daily along with Plavix 75 mg daily. Repeat head CT on 08/14/2014 showed unchanged appearance  of right MCA territory infarct with hemorrhage, no interval changes from previous study on 08/12/2014. Neurology recommended discharging him on 37.5 mg of Plavix with aspirin 325 mg daily. Patient was discharged in stable condition on 08/14/2014 to his home. 08/27/14 [name] was seen in ED for acute onset right sided headache. CT head shows no acute findings with improvement of edema and less apparent hemorrhage. [name] was discharged and asked to follow up with neurology as outpt ASAP.   Follow up 08/29/14 - the patient has been doing stable. [name] still complains of intermittent mild pain at right side but tolerable. [name] stated that [name] went to see Dr. Corliss Skainseveshwar 11/12  as follow up and discussed about further right ICA intervention and right MCA aneurysm intervention. [name] has next appointment with Dr. Corliss Skainseveshwar on 09/25/14.  Interval History During the interval time, patient has been doing well. [name] was seen by Dr. Corliss Skainseveshwar on 11/21/14 and repeated angiogram showed right ICA partial recannulized with stenosis at 77%, continued right MCA and left ACA aneurysms. [name] was instructed by Dr. Corliss Skainseveshwar to continue aspirin and Plavix as current dose, and will do right ICA stent in the possible right MCA aneurysm coiling in 3 months. [name] also had EMG/NCS with Dr. Lucia GaskinsAhern in 10/2014 showed normal study. [name] still complains of intermittent LLE numbness and pain. She has been followed with Dr. Pearlean BrownieSethi for [name] follow up visits. [name] states that his blood pressure was controlled at home as well as as his glucose. His recent A1c was 5.4 in January 2016 and his blood pressure today in  clinic 126/73.  REVIEW OF SYSTEMS: Full 14 system review of systems performed and notable only for those listed below and in HPI above, all others are negative:  Constitutional: N/A  Cardiovascular: Chest pain  Ear/Nose/Throat: ear discharge, trouble swallowing  Skin: Itching  Eyes: Eye itching, light sensitivity  Respiratory: Wheezing, SOB    Gastroitestinal: N/A  Genitourinary: N/A Hematology/Lymphatic: N/A  Endocrine: Flushing  Musculoskeletal: neck pain, neck stiffness, joint pain, back pain, aching muscles  Allergy/Immunology: N/A  Neurological: headache, numbness Psychiatric: Depression, frequent waking, daytime sleepiness, snoring  The following represents the patient's updated allergies and side effects list: Allergies  Allergen Reactions  . Tylox [Oxycodone-Acetaminophen] Hives and Other (See Comments)    Reaction: Tremors and diaphoresis   . Adhesive [Tape] Rash    Labs since last visit of relevance include the following: Results for orders placed or performed during the hospital encounter of 11/21/14  Glucose, capillary  Result Value Ref Range   Glucose-Capillary 96 70 - 99 mg/dL  Glucose, capillary  Result Value Ref Range   Glucose-Capillary 83 70 - 99 mg/dL   Comment 1 Notify RN     The neurologically relevant items on the patient's problem list were reviewed on today's visit.  Neurologic Examination  A problem focused neurological exam (12 or more points of the single system neurologic examination, vital signs counts as 1 point, cranial nerves count for 8 points) was performed.  Blood pressure 126/77, pulse 57, height 5\' 8"  (1.727 m), weight 237 lb (107.502 kg).  General - Well nourished, well developed, in no apparent distress.  Ophthalmologic - not able to see through.  Cardiovascular - Regular rate and rhythm with no murmur.  Mental Status -  Level of arousal and orientation to time, place, and person were intact. Language including expression, naming, repetition, comprehension was assessed and found intact.  Cranial Nerves II - XII - II - Visual field intact OU. III, IV, VI - Extraocular movements intact. V - Facial sensation intact bilaterally. VII - Facial movement intact bilaterally. VIII - Hearing & vestibular intact bilaterally. X - Palate elevates symmetrically. XI - Chin  turning & shoulder shrug intact bilaterally. XII - Tongue protrusion intact.  Motor Strength - The patient's strength was normal in all extremities and pronator drift was absent.  Bulk was normal and fasciculations were absent.   Motor Tone - Muscle tone was assessed at the neck and appendages and was normal.  Reflexes - The patient's reflexes were normal in all extremities and [name] had no pathological reflexes.  Sensory - Light touch, temperature/pinprick, vibration and proprioception, and Romberg testing were assessed and were normal.    Coordination - The patient had normal movements in the hands and feet with no ataxia or dysmetria.  Tremor was absent.  Gait and Station - The patient's transfers, posture, gait, station, and turns were observed as normal.  Data reviewed: I personally reviewed the images and agree with the radiology interpretations.  Ct Head (brain) Wo Contrast  10/14/14 - 1. No evidence of traumatic intracranial injury or fracture. 2. Chronic encephalomalacia at the right frontoparietal region, with involvement of the right basal ganglia, reflecting remote infarct. 3. Status post left-sided aneurysm clipping, with associated clips.  08/27/2014   IMPRESSION: 1. Continued evolution of hemorrhagic right MCA infarct, without new acute findings, as detailed above.      08/14/2014   IMPRESSION: Unchanged appearance of right MCA infarct with hemorrhage. No interval changes from 08/12/2014.      08/12/2014  IMPRESSION: Stable right hemorrhagic infarct involving the insular and Barre insular cortex in the right MCA distribution. No increasing mass effect. No new infarct. No increasing hemorrhage.     08/11/2014   IMPRESSION: 1. Focal small areas of hemorrhagic transformation of the right middle cerebral artery infarct in the right insula. No subarachnoid hemorrhage. 2. Marked improvement in edema at the site of the prior infarct with resolution of mass effect upon the right  lateral ventricle.    [name]  08/11/2014   IMPRESSION: Hemorrhagic transformation of right MCA infarct. The infarct volume has increased in size since 07/04/2014 consistent with extension however this does not appear to be acute. The exact time of the infarct extension hemorrhage cannot be determined on this study but occurred in the interval.  No acute infarct on the left.  Interval coiling of left MCA aneurysm.      Cerebral angio  11/21/14 -  Interval partial recanalization of the right carotid bulb with continued presence of filling defects in this pouch. These probably represent dissolving clots. Improved flow into the right internal carotid artery is noted with associated approximately 77% narrowing by NASCET criteria. Approximately 4.2 mm x 3.6 mm right MCA bifurcation aneurysm stable. Completely obliterated left MCA bifurcation aneurysm with no evidence of coil compaction or recanalization. Widely patent stent across the neck of the aneurysm. Approximately 2.5 mm x 2.1 mm left ACA proximal aneurysm projecting superiorly.  08/13/2014   IMPRESSION: Status post endovascular complete obliteration of the unruptured left middle cerebral artery region aneurysm using the LVIS JR stent assisted coiling as described.  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    Dg Chest Port 1 View  08/11/2014   IMPRESSION: No significant abnormalities.      EEG 10/31 -  this is a normal EEG recording during wakefulness and during sleep with no evidence of seizure disorder demonstrated.  2D echo - - Left ventricle: The cavity size was normal. There was mild concentric hypertrophy. Systolic function was normal. The estimated ejection fraction was in the range of 55% to 60%. Images were inadequate for  LV wall motion assessment. - Left atrium: The atrium was mildly to moderately dilated.  CUS 07/05/14 - Right ICA is occluded. Left: mild soft plaque origin ICA. 40-50% ICA stenosis, velocities may be elevated secondary to compensatoryflow. Bilateral vertebral artery flow is antegrade.  EMG/NCS 10/31/14 - This is a normal study. There is no electrophysiologic evidence for radiculopathy or polyneuropathy. Clinical correlation suggested.  Component     Latest Ref Rng 08/12/2014  Cholesterol     0 - 200 mg/dL 161  Triglycerides     <150 mg/dL 90  HDL     >09 mg/dL 31 (L)  Total CHOL/HDL Ratio      3.7  VLDL     0 - 40 mg/dL 18  LDL (calc)     0 - 99 mg/dL 65  Hgb U0A MFr Bld     <5.7 % 5.9 (H)  Mean Plasma Glucose     <117 mg/dL 540 (H)    Assessment: As you may recall, [name] is a 54 y.o. Caucasian male with PMH of "leaky valve" and asthma was admitted to [facility] in 06/2013 due to right MCA stroke. Found to have right ICA occlusion as well as left MCA aneurysm. [name] had  election left MCA aneurysm coiling and stenting. [name] was put on ASA and plavix. In 07/2014 [name] had a right arm weakness and CT/MRI showed small hemorrhagic transformation at the right stroke site, not able to explain his right sided weakness. Due to potential TIA episodes, [name] was changed to ASA 325 and 75 mg plavix. Later that was changed to ASA  with 37.5mg  plavix by neurology. [name] saw Dr. [name] on 11/21/14 and angiogram showed right ICA partial recannulization with 77% stenosis, continued right MCA and left ACA aneurysms. [name] was scheduled with Dr. [name] for right ICA stenting and the possible right MCA aneurysm coiling in 3 months. His EMG/NCS was normal, no radiculopathy or polyneuropathy. His LLE numbness and pain likely due to central origin.   Plan:  - continue ASA and half dose of plavix.  - follow up with Dr. [name] in 3 months for right ICA stenosis and right MCA aneurysm. Need to discuss about the risk and  benefits.  - Follow up with your primary care physician for stroke risk factor modification. Recommend maintain blood pressure goal <130/80, diabetes with hemoglobin A1c goal below 6.5% and lipids with LDL cholesterol goal below 70 mg/dL.  - check BP at home, goal 110-140 - check glucose at home.  - add neurotin for LLE numbness and pain - RTC in 3 months   No orders of the defined types were placed in this encounter.    Meds ordered this encounter  Medications  . meloxicam (MOBIC) 15 MG tablet    Sig: Take 15 mg by mouth daily.  [name] gabapentin (NEURONTIN) 100 MG capsule    Sig: Take 1 capsule (100 mg total) by mouth 3 (three) times daily.    Dispense:  90 capsule    Refill:  2    Patient Instructions  - continue ASA  and 37.5mg  plavix as directed by Dr. [name]. - please discuss with Dr. [name] regarding the risk and benefit of the procedure - Follow up with your primary care physician for stroke risk factor modification. Recommend maintain blood pressure goal <130/80, diabetes with hemoglobin A1c goal below 6.5% and lipids with LDL cholesterol goal below 70 mg/dL.  - check BP at home and glucose and avoid hypotension - BP goal 110-140 - will prescribe gabapentin for left side numbness and tingling - follow up in 3 months.    Marvel Plan, MD PhD [facility] [address], [address] [place], [place] [address] [phone]

## 2014-12-04 NOTE — Patient Instructions (Signed)
-   continue ASA 81mg  and 37.5mg  plavix as directed by Dr. Corliss Skainseveshwar. - please discuss with Dr. Corliss Skainseveshwar regarding the risk and benefit of the procedure - Follow up with your primary care physician for stroke risk factor modification. Recommend maintain blood pressure goal <130/80, diabetes with hemoglobin A1c goal below 6.5% and lipids with LDL cholesterol goal below 70 mg/dL.  - check BP at home and glucose and avoid hypotension - BP goal 110-140 - will prescribe gabapentin for left side numbness and tingling - follow up in 3 months.

## 2015-01-09 ENCOUNTER — Other Ambulatory Visit: Payer: Self-pay | Admitting: Neurology

## 2015-01-22 ENCOUNTER — Encounter: Payer: Self-pay | Admitting: Neurology

## 2015-01-22 ENCOUNTER — Ambulatory Visit (INDEPENDENT_AMBULATORY_CARE_PROVIDER_SITE_OTHER): Payer: Medicaid Other | Admitting: Neurology

## 2015-01-22 VITALS — BP 105/72 | HR 60 | Ht 68.0 in | Wt 238.4 lb

## 2015-01-22 DIAGNOSIS — I6521 Occlusion and stenosis of right carotid artery: Secondary | ICD-10-CM | POA: Diagnosis not present

## 2015-01-22 NOTE — Progress Notes (Signed)
STROKE NEUROLOGY FOLLOW UP NOTE  NAME: [name] DOB: 09/22/1961  REASON FOR VISIT: stroke follow up HISTORY FROM: pt and chart  Today we had the pleasure of seeing [name] in follow-up at our Neurology Clinic. Pt was accompanied by mom.   History Summary 54 year old gentleman with a past medical history of "leaky valve" followed by Dr. Sharyn LullHarwani, asthma was admitted on 07/03/14 for acute onset lightheadedness, left arm numbness as well as left facial droop and slurred speech. MRI showed right MCA stroke. 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 Strong family history of cerebral aneurysms with rupture. Recommended outpt follow up. His stroke was considered related to right ICA stenosis/occlusion, but he was also enrolled in [name] trial and discharged home with home PT/OT. He was also admitted on 07/07/14 for atypical chest pain and work up negative and d/c the next day. 08/08/14 he had left MCA aneurysm coiling with stenting by Dr. Corliss Skainseveshwar. 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 He was taken off [name] and put on ASA 81 and plavix 75mg  on discharge the next day. However, he was re-admitted on 08/11/14 due to transient right arm weakness and dizziness x 2 episodes. CT head showed focal small areas of hemorrhagic transformation on the right MCA infarct in the right insula which was confirmed with MRI. Right arm weakness did not appear to correlate with right-sided hemorrhagic transformation seen on MRI. There is no evidence of acute CVA on left side of brain per MRI report. Suspect his right UE weakness could have been related to TIA. Case was discussed with Dr. Corliss Skainseveshwar who initially recommended increasing aspirin to 325 mg daily along with Plavix 75 mg daily. Repeat head CT on 08/14/2014 showed unchanged appearance  of right MCA territory infarct with hemorrhage, no interval changes from previous study on 08/12/2014. Neurology recommended discharging him on 37.5 mg of Plavix with aspirin 325 mg daily. Patient was discharged in stable condition on 08/14/2014 to his home. 08/27/14 he was seen in ED for acute onset right sided headache. CT head shows no acute findings with improvement of edema and less apparent hemorrhage. He was discharged and asked to follow up with neurology as outpt ASAP.   Follow up 08/29/14 - the patient has been doing stable. He still complains of intermittent mild pain at right side but tolerable. He stated that he went to see Dr. Corliss Skainseveshwar 11/12  as follow up and discussed about further right ICA intervention and right MCA aneurysm intervention. He has next appointment with Dr. Corliss Skainseveshwar on 09/25/14.  Interval History During the interval time, patient has been doing well. He was seen by Dr. Corliss Skainseveshwar on 11/21/14 and repeated angiogram showed right ICA partial recannulized with stenosis at 77%, continued right MCA and left ACA aneurysms. He was instructed by Dr. Corliss Skainseveshwar to continue aspirin and Plavix as current dose, and will do right ICA stent in the possible right MCA aneurysm coiling in 3 months. He also had EMG/NCS with Dr. Lucia GaskinsAhern in 10/2014 showed normal study. He still complains of intermittent LLE numbness and pain. She has been followed with Dr. Pearlean BrownieSethi for [name] follow up visits. He states that his blood pressure was controlled at home as well as as his glucose. His recent A1c was 5.4 in January 2016 and his blood pressure today in  clinic 126/73. Update 01/22/15 : He returns for follow-up after last visit to 6 weeks ago with Dr. Roda Shutters. He has not yet had repeat a second aneurysm treatment by Dr. Patricia Nettle which is scheduled now for a number of May. He still has occasional left perioral numbness off and he remains on Neurontin 100 mg 3 times daily which seems to be helping. on which is transient  particularly noted when he is tired. He states his blood pressure control has been good. Last lipid profile checked in November 2015 showed LDL of 68. Last hemoglobin A1c checked last month was 6.4. He has been walking daily as well as drinking lots of water. He remains on aspirin and Plavix which is tolerating well without significant bleeding or bruising. He still has some numbness in his thighs and legs and back but it seems to be improving. EMG nerve conduction study done by Dr. Daisy Blossom in January 2016 was unremarkable without evidence of radiculopathy or neuropathy. Lower extremity arterial Dopplers were also fairly benign REVIEW OF SYSTEMS: Full 14 system review of systems performed and notable only for those listed below and in HPI above, all others are negative: Chills, ear discharge, trouble swallowing, eye itching, shortness of breath, chest tightness, chest pain and leg swelling, excessive thirst, flushing, frequency of urination, headache, facial drooping, joint and back pain, muscle cramps, neck pain and stiffness, frequent waking and snoring.  The following represents the patient's updated allergies and side effects list: Allergies  Allergen Reactions  . Tylox [Oxycodone-Acetaminophen] Hives and Other (See Comments)    Reaction: Tremors and diaphoresis   . Adhesive [Tape] Rash    Labs since last visit of relevance include the following: Results for orders placed or performed during the hospital encounter of 11/21/14  Glucose, capillary  Result Value Ref Range   Glucose-Capillary 96 70 - 99 mg/dL  Glucose, capillary  Result Value Ref Range   Glucose-Capillary 83 70 - 99 mg/dL   Comment 1 Notify RN     The neurologically relevant items on the patient's problem list were reviewed on today's visit.        Blood pressure 105/72, pulse 60, height  (1.727 m), weight 238 lb 6.4 oz (108.138 kg). Physical Exam :   .obese middle aged Caucasian male not in distress. Afebrile. Head  is nontraumatic. Neck is supple without bruit.    Cardiac exam no murmur or gallop. Lungs are clear to auscultation. Distal pulses are well felt.   Neurological Exam ;  Awake  Alert oriented x 3. Normal speech and language.eye movements full without nystagmus.fundi were not visualized. Vision acuity and fields appear normal. Hearing is normal. Palatal movements are normal. Face symmetric. Tongue midline. Normal strength, tone, reflexes and coordination. Normal sensation. Gait deferred..  Data reviewed: I personally reviewed the images and agree with the radiology interpretations.  Ct Head (brain) Wo Contrast  10/14/14 - 1. No evidence of traumatic intracranial injury or fracture. 2. Chronic encephalomalacia at the right frontoparietal region, with involvement of the right basal ganglia, reflecting remote infarct. 3. Status post left-sided aneurysm clipping, with associated clips.  08/27/2014   IMPRESSION: 1. Continued evolution of hemorrhagic right MCA infarct, without new acute findings, as detailed above.      08/14/2014   IMPRESSION: Unchanged appearance of right MCA infarct with hemorrhage. No interval changes from 08/12/2014.      08/12/2014   IMPRESSION: Stable right hemorrhagic infarct involving the insular and Barre insular cortex in the right MCA distribution.  No increasing mass effect. No new infarct. No increasing hemorrhage.     08/11/2014   IMPRESSION: 1. Focal small areas of hemorrhagic transformation of the right middle cerebral artery infarct in the right insula. No subarachnoid hemorrhage. 2. Marked improvement in edema at the site of the prior infarct with resolution of mass effect upon the right lateral ventricle.    [name]  08/11/2014   IMPRESSION: Hemorrhagic transformation of right MCA infarct. The infarct volume has increased in size since 07/04/2014 consistent with extension however this does not appear to be acute. The exact time of the infarct extension  hemorrhage cannot be determined on this study but occurred in the interval.  No acute infarct on the left.  Interval coiling of left MCA aneurysm.      Cerebral angio  11/21/14 -  Interval partial recanalization of the right carotid bulb with continued presence of filling defects in this pouch. These probably represent dissolving clots. Improved flow into the right internal carotid artery is noted with associated approximately 77% narrowing by NASCET criteria. Approximately 4.2 mm x 3.6 mm right MCA bifurcation aneurysm stable. Completely obliterated left MCA bifurcation aneurysm with no evidence of coil compaction or recanalization. Widely patent stent across the neck of the aneurysm. Approximately 2.5 mm x 2.1 mm left ACA proximal aneurysm projecting superiorly.  08/13/2014   IMPRESSION: Status post endovascular complete obliteration of the unruptured left middle cerebral artery region aneurysm using the LVIS JR stent assisted coiling as described.  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    Dg Chest Port 1 View  08/11/2014   IMPRESSION: No significant abnormalities.      EEG 10/31 -  this is a normal EEG recording during wakefulness and during sleep with no evidence of seizure disorder demonstrated.  2D echo - - Left ventricle: The cavity size was normal. There was mild concentric hypertrophy. Systolic function was normal. The estimated ejection fraction was in the range of 55% to 60%. Images were inadequate for LV wall motion assessment. - Left atrium: The atrium was mildly to moderately dilated.  CUS 07/05/14 - Right ICA is occluded. Left: mild soft plaque origin ICA. 40-50% ICA stenosis, velocities may be elevated secondary to compensatoryflow. Bilateral vertebral artery flow is  antegrade.  EMG/NCS 10/31/14 - This is a normal study. There is no electrophysiologic evidence for radiculopathy or polyneuropathy. Clinical correlation suggested.  Component     Latest Ref Rng 08/12/2014  Cholesterol     0 - 200 mg/dL 098114  Triglycerides     <150 mg/dL 90  HDL     >11>39 mg/dL 31 (L)  Total CHOL/HDL Ratio      3.7  VLDL     0 - 40 mg/dL 18  LDL (calc)     0 - 99 mg/dL 65  Hgb B1YA1c MFr Bld     <5.7 % 5.9 (H)  Mean Plasma Glucose     <117 mg/dL 782123 (H)    Assessment: As you may recall, he is a 54 y.o. Caucasian male with PMH of "leaky valve" and asthma [facility] due to right MCA stroke. Found to have right ICA occlusion as well as left MCA aneurysm. He had election left MCA aneurysm coiling and stenting. He was put on ASA and plavix. In 07/2014 he  had a right arm weakness and CT/MRI showed small hemorrhagic transformation at the right stroke site, not able to explain his right sided weakness. Due to potential TIA episodes, he was changed to ASA 325 and 75 mg plavix. Later that was changed to ASA  with 37.5mg  plavix by neurology. He saw Dr. [name] on 11/21/14 and angiogram showed right ICA partial recannalization with 77% stenosis, continued right MCA and left ACA aneurysms. He was scheduled with Dr. [name] for right ICA stenting and the possible right MCA aneurysm coiling in 3 months. His EMG/NCS was normal, no radiculopathy or polyneuropathy. His LLE numbness and pain likely due to central origin.   Plan:  -I had a long d/w patient about his recent stroke, risk for recurrent stroke/TIAs, personally independently reviewed imaging studies and stroke evaluation results and answered questions.Continue aspirin and plavix qod given h/o stent assisted aneurysm coiling  for secondary stroke prevention and maintain strict control of hypertension with blood pressure goal below 130/90, diabetes with hemoglobin A1c goal below 6.5% and lipids with LDL cholesterol  goal below 100 mg/dL. I also advised the patient to eat a healthy diet with plenty of whole grains, cereals, fruits and vegetables, exercise regularly and maintain ideal body weight. Continue Neurontin for paresthesias which seems to be helping.Marland KitchenKeep f/u with Dr [name] for aneurysm f/u and treatment.Followup in the future with me in 6 months or call earlier if necessary.  No orders of the defined types were placed in this encounter.          [name]        [name]

## 2015-01-22 NOTE — Patient Instructions (Addendum)
I had a long d/w patient about his recent stroke, risk for recurrent stroke/TIAs, personally independently reviewed imaging studies and stroke evaluation results and answered questions.Continue aspirin and plavix qod given h/o stent assisted aneurysm coiling  for secondary stroke prevention and maintain strict control of hypertension with blood pressure goal below 130/90, diabetes with hemoglobin A1c goal below 6.5% and lipids with LDL cholesterol goal below 100 mg/dL. I also advised the patient to eat a healthy diet with plenty of whole grains, cereals, fruits and vegetables, exercise regularly and maintain ideal body weight. Continue Neurontin for paresthesias which seems to be helping.[name].Keep f/u with Dr Corliss Skainseveshwar for aneurysm f/u and treatment.Followup in the future with me in 6 months or call earlier if necessary. Stroke Prevention Some medical conditions and behaviors are associated with an increased chance of having a stroke. You may prevent a stroke by making healthy choices and managing medical conditions. HOW CAN I REDUCE MY RISK OF HAVING A STROKE?   Stay physically active. Get at least 30 minutes of activity on most or all days.  Do not smoke. It may also be helpful to avoid exposure to secondhand smoke.  Limit alcohol use. Moderate alcohol use is considered to be:  No more than 2 drinks per day for men.  No more than 1 drink per day for nonpregnant women.  Eat healthy foods. This involves:  Eating 5 or more servings of fruits and vegetables a day.  Making dietary changes that address high blood pressure (hypertension), high cholesterol, diabetes, or obesity.  Manage your cholesterol levels.  Making food choices that are high in fiber and low in saturated fat, trans fat, and cholesterol may control cholesterol levels.  Take any prescribed medicines to control cholesterol as directed by your health care provider.  Manage your diabetes.  Controlling your carbohydrate and sugar  intake is recommended to manage diabetes.  Take any prescribed medicines to control diabetes as directed by your health care provider.  Control your hypertension.  Making food choices that are low in salt (sodium), saturated fat, trans fat, and cholesterol is recommended to manage hypertension.  Take any prescribed medicines to control hypertension as directed by your health care provider.  Maintain a healthy weight.  Reducing calorie intake and making food choices that are low in sodium, saturated fat, trans fat, and cholesterol are recommended to manage weight.  Stop drug abuse.  Avoid taking birth control pills.  Talk to your health care provider about the risks of taking birth control pills if you are over 593 years old, smoke, get migraines, or have ever had a blood clot.  Get evaluated for sleep disorders (sleep apnea).  Talk to your health care provider about getting a sleep evaluation if you snore a lot or have excessive sleepiness.  Take medicines only as directed by your health care provider.  For some people, aspirin or blood thinners (anticoagulants) are helpful in reducing the risk of forming abnormal blood clots that can lead to stroke. If you have the irregular heart rhythm of atrial fibrillation, you should be on a blood thinner unless there is a good reason you cannot take them.  Understand all your medicine instructions.  Make sure that other conditions (such as anemia or atherosclerosis) are addressed. SEEK IMMEDIATE MEDICAL CARE IF:   You have sudden weakness or numbness of the face, arm, or leg, especially on one side of the body.  Your face or eyelid droops to one side.  You have sudden confusion.  You have trouble speaking (aphasia) or understanding.  You have sudden trouble seeing in one or both eyes.  You have sudden trouble walking.  You have dizziness.  You have a loss of balance or coordination.  You have a sudden, severe headache with no  known cause.  You have new chest pain or an irregular heartbeat. Any of these symptoms may represent a serious problem that is an emergency. Do not wait to see if the symptoms will go away. Get medical help at once. Call your local emergency services (911 in [place] Do not drive yourself to the hospital. Document Released: 11/05/2004 Document Revised: 02/12/2014 Document Reviewed: 03/31/2013 [facility] Patient Information 2015 [place], [place]. This information is not intended to replace advice given to you by your health care provider. Make sure you discuss any questions you have with your health care provider.

## 2015-01-31 DIAGNOSIS — Z0289 Encounter for other administrative examinations: Secondary | ICD-10-CM

## 2015-02-07 ENCOUNTER — Other Ambulatory Visit: Payer: Self-pay | Admitting: Radiology

## 2015-02-20 DIAGNOSIS — J449 Chronic obstructive pulmonary disease, unspecified: Secondary | ICD-10-CM | POA: Diagnosis not present

## 2015-02-20 DIAGNOSIS — E785 Hyperlipidemia, unspecified: Secondary | ICD-10-CM | POA: Diagnosis not present

## 2015-02-20 DIAGNOSIS — J45909 Unspecified asthma, uncomplicated: Secondary | ICD-10-CM | POA: Diagnosis not present

## 2015-02-20 DIAGNOSIS — N189 Chronic kidney disease, unspecified: Secondary | ICD-10-CM | POA: Diagnosis not present

## 2015-02-20 DIAGNOSIS — F319 Bipolar disorder, unspecified: Secondary | ICD-10-CM | POA: Diagnosis not present

## 2015-02-20 DIAGNOSIS — M1990 Unspecified osteoarthritis, unspecified site: Secondary | ICD-10-CM | POA: Diagnosis not present

## 2015-02-20 DIAGNOSIS — Z9689 Presence of other specified functional implants: Secondary | ICD-10-CM | POA: Diagnosis not present

## 2015-02-20 DIAGNOSIS — I129 Hypertensive chronic kidney disease with stage 1 through stage 4 chronic kidney disease, or unspecified chronic kidney disease: Secondary | ICD-10-CM | POA: Diagnosis not present

## 2015-02-20 DIAGNOSIS — Z8673 Personal history of transient ischemic attack (TIA), and cerebral infarction without residual deficits: Secondary | ICD-10-CM | POA: Diagnosis not present

## 2015-02-20 DIAGNOSIS — K219 Gastro-esophageal reflux disease without esophagitis: Secondary | ICD-10-CM | POA: Diagnosis not present

## 2015-02-20 DIAGNOSIS — I671 Cerebral aneurysm, nonruptured: Secondary | ICD-10-CM | POA: Diagnosis not present

## 2015-02-20 DIAGNOSIS — Z87891 Personal history of nicotine dependence: Secondary | ICD-10-CM | POA: Diagnosis not present

## 2015-02-20 DIAGNOSIS — I6521 Occlusion and stenosis of right carotid artery: Secondary | ICD-10-CM | POA: Diagnosis not present

## 2015-02-20 DIAGNOSIS — K449 Diaphragmatic hernia without obstruction or gangrene: Secondary | ICD-10-CM | POA: Diagnosis not present

## 2015-02-20 DIAGNOSIS — Z7902 Long term (current) use of antithrombotics/antiplatelets: Secondary | ICD-10-CM | POA: Diagnosis not present

## 2015-02-20 DIAGNOSIS — E1122 Type 2 diabetes mellitus with diabetic chronic kidney disease: Secondary | ICD-10-CM | POA: Diagnosis not present

## 2015-02-20 LAB — PLATELET INHIBITION P2Y12: PLATELET FUNCTION P2Y12: 48 [PRU] — AB (ref 194–418)

## 2015-02-22 ENCOUNTER — Other Ambulatory Visit (HOSPITAL_COMMUNITY): Payer: Self-pay | Admitting: *Deleted

## 2015-02-22 NOTE — Addendum Note (Signed)
Addended by: Winifred OliveRABTREE, Cipriana Biller W on: 02/22/2015 05:25 PM   Modules accepted: Orders

## 2015-02-22 NOTE — Pre-Procedure Instructions (Addendum)
Ivan ClontsRichard L [name]  02/22/2015   Your procedure is scheduled on:  Wednesday, Feb 27, 2015 at 8:30 AM.   [facility] at 6:30 AM.   Call this number if you have problems the morning of surgery: [phone]   Remember:   Do not eat food or drink liquids after midnight Tuesday, 02/26/15.   Take these medicines the morning of surgery with A SIP OF WATER: Aspirin, Clopidogrel (Plavix), Gabapentin (Neurontin), Albuterol inhaler - if needed, Loratadine (Claritin) - if needed, Spiriva inhaler - if needed  Stop Meloxicam (Mobic) as of today.  Do NOT take Metformin (Glucophage) the morning of surgery.   Do not wear jewelry.  Do not wear lotions, powders, or cologne. You may wear deodorant.  Men may shave face and neck.  Do not bring valuables to the hospital.  [facility] is not responsible  for any belongings or valuables.               Contacts, dentures or bridgework may not be worn into surgery.  Leave suitcase in the car. After surgery it may be brought to your room.  For patients admitted to the hospital, discharge time is determined by your                treatment team.               Special Instructions: See "Preparing for Surgery" Instruction sheet.    Please read over the following fact sheets that you were given: Pain Booklet, Coughing and Deep Breathing and Surgical Site Infection Prevention

## 2015-02-25 ENCOUNTER — Encounter (HOSPITAL_COMMUNITY)
Admission: RE | Admit: 2015-02-25 | Discharge: 2015-02-25 | Disposition: A | Discharge status: Home / Self Care | Payer: Medicaid Other | Source: Ambulatory Visit | Attending: Interventional Radiology | Admitting: Interventional Radiology

## 2015-02-25 ENCOUNTER — Other Ambulatory Visit (HOSPITAL_COMMUNITY): Payer: Self-pay | Admitting: Interventional Radiology

## 2015-02-25 ENCOUNTER — Other Ambulatory Visit: Payer: Self-pay | Admitting: Radiology

## 2015-02-25 DIAGNOSIS — Z87891 Personal history of nicotine dependence: Secondary | ICD-10-CM | POA: Diagnosis not present

## 2015-02-25 DIAGNOSIS — Z01812 Encounter for preprocedural laboratory examination: Secondary | ICD-10-CM | POA: Diagnosis not present

## 2015-02-25 DIAGNOSIS — Z01818 Encounter for other preprocedural examination: Secondary | ICD-10-CM | POA: Diagnosis not present

## 2015-02-25 DIAGNOSIS — J45909 Unspecified asthma, uncomplicated: Secondary | ICD-10-CM | POA: Diagnosis not present

## 2015-02-25 DIAGNOSIS — Z7902 Long term (current) use of antithrombotics/antiplatelets: Secondary | ICD-10-CM | POA: Insufficient documentation

## 2015-02-25 DIAGNOSIS — I671 Cerebral aneurysm, nonruptured: Secondary | ICD-10-CM | POA: Insufficient documentation

## 2015-02-25 DIAGNOSIS — Z7982 Long term (current) use of aspirin: Secondary | ICD-10-CM | POA: Diagnosis not present

## 2015-02-25 DIAGNOSIS — N189 Chronic kidney disease, unspecified: Secondary | ICD-10-CM | POA: Diagnosis not present

## 2015-02-25 DIAGNOSIS — J449 Chronic obstructive pulmonary disease, unspecified: Secondary | ICD-10-CM | POA: Diagnosis not present

## 2015-02-25 DIAGNOSIS — E1122 Type 2 diabetes mellitus with diabetic chronic kidney disease: Secondary | ICD-10-CM | POA: Diagnosis not present

## 2015-02-25 DIAGNOSIS — E785 Hyperlipidemia, unspecified: Secondary | ICD-10-CM | POA: Diagnosis not present

## 2015-02-25 DIAGNOSIS — I639 Cerebral infarction, unspecified: Secondary | ICD-10-CM

## 2015-02-25 DIAGNOSIS — I129 Hypertensive chronic kidney disease with stage 1 through stage 4 chronic kidney disease, or unspecified chronic kidney disease: Secondary | ICD-10-CM | POA: Diagnosis not present

## 2015-02-25 DIAGNOSIS — I729 Aneurysm of unspecified site: Secondary | ICD-10-CM

## 2015-02-25 DIAGNOSIS — Z8673 Personal history of transient ischemic attack (TIA), and cerebral infarction without residual deficits: Secondary | ICD-10-CM | POA: Diagnosis not present

## 2015-02-25 LAB — CBC WITH DIFFERENTIAL/PLATELET
Basophils Absolute: 0 10*3/uL (ref 0.0–0.1)
Basophils Relative: 0 % (ref 0–1)
Eosinophils Absolute: 0.2 10*3/uL (ref 0.0–0.7)
Eosinophils Relative: 3 % (ref 0–5)
HCT: 41.1 % (ref 39.0–52.0)
Hemoglobin: 13.9 g/dL (ref 13.0–17.0)
LYMPHS PCT: 37 % (ref 12–46)
Lymphs Abs: 2 10*3/uL (ref 0.7–4.0)
MCH: 29.4 pg (ref 26.0–34.0)
MCHC: 33.8 g/dL (ref 30.0–36.0)
MCV: 87.1 fL (ref 78.0–100.0)
MONO ABS: 0.4 10*3/uL (ref 0.1–1.0)
Monocytes Relative: 7 % (ref 3–12)
Neutro Abs: 2.9 10*3/uL (ref 1.7–7.7)
Neutrophils Relative %: 53 % (ref 43–77)
PLATELETS: 208 10*3/uL (ref 150–400)
RBC: 4.72 MIL/uL (ref 4.22–5.81)
RDW: 12.4 % (ref 11.5–15.5)
WBC: 5.5 10*3/uL (ref 4.0–10.5)

## 2015-02-25 LAB — PROTIME-INR
INR: 0.9 (ref 0.00–1.49)
PROTHROMBIN TIME: 12.3 s (ref 11.6–15.2)

## 2015-02-25 LAB — PLATELET INHIBITION P2Y12: Platelet Function  P2Y12: 116 [PRU] — ABNORMAL LOW (ref 194–418)

## 2015-02-25 LAB — COMPREHENSIVE METABOLIC PANEL
ALBUMIN: 4.2 g/dL (ref 3.5–5.0)
ALT: 24 U/L (ref 17–63)
ANION GAP: 7 (ref 5–15)
AST: 18 U/L (ref 15–41)
Alkaline Phosphatase: 61 U/L (ref 38–126)
BILIRUBIN TOTAL: 0.4 mg/dL (ref 0.3–1.2)
BUN: 12 mg/dL (ref 6–20)
CALCIUM: 9.2 mg/dL (ref 8.9–10.3)
CHLORIDE: 104 mmol/L (ref 101–111)
CO2: 28 mmol/L (ref 22–32)
CREATININE: 0.78 mg/dL (ref 0.61–1.24)
GFR calc Af Amer: >60 mL/min (ref >60)
GFR calc non Af Amer: >60 mL/min (ref >60)
Glucose, Bld: 92 mg/dL (ref 65–99)
Potassium: 4.2 mmol/L (ref 3.5–5.1)
Sodium: 139 mmol/L (ref 135–145)
Total Protein: 7.1 g/dL (ref 6.5–8.1)

## 2015-02-25 LAB — APTT: aPTT: 28 seconds (ref 24–37)

## 2015-02-25 NOTE — Progress Notes (Signed)
Anesthesia Chart Review:  Pt is 54 year old male scheduled for cerebral angiogram with possible R carotid stent assisted angioplasty on 02/27/2015 with Dr. Corliss Skainseveshwar.   Cardiologist is Dr. Sharyn LullHarwani, last office visit 01/14/2015.  PMH includes: stroke (06/2014), HTN, DM, COPD, asthma, hyperlipidemia, aneurysm, CKD. BMI 36. Former smoker. S/p cerebral angiogram 11/21/14. S/p radiology with anesthesia embolization 08/08/14.   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 He underwent elective coiling with stent placement on 08/08/2014 (they also found a R MCA aneurysm during the procedure). 08/11/2014 MRI for symptoms showed hemorrhagic transformation of right MCA territory infarct, but this did not appear to be acute. TIA was suspected.   Medications include ASA, plavix. Pt is to continue these during surgery.  Preoperative labs reviewed.   Chest x-ray 11/13/2014 reviewed.No active cardiopulmonary disease  EKG 10/03/2014: NSR. Incomplete RBBB. Minimal voltage criteria for LVH, may be normal variant. Nonspecific T wave abnormality.   Echo 07/05/2014: - Left ventricle: The cavity size was normal. There was mild concentric hypertrophy. Systolic function was normal. The estimated ejection fraction was in the range of 55% to 60%. Images were inadequate for LV wall motion assessment. - Left atrium: The atrium was mildly to moderately dilated.  Carotid doppler US 07/05/2014: -Right ICA is occluded. Left: mild soft plaque origin ICA. 40-50% ICA stenosis, velocities may be elevated secondary to compensatory flow. Bilateral vertebral artery flow is antegrade.  Nuclear stress test 02/03/2010: 1. No areas of reversibility to suggest inducible ischemia. 2. Mild global hypokinesis. 3. Ejection fraction estimated at 43%.   If no changes, I anticipate pt can proceed with surgery as scheduled.   Rica Mastngela Jehiel Koepp,  FNP-BC [facility]/Anesthesiology Phone: [phone] 02/25/2015 4:39 PM

## 2015-02-25 NOTE — Progress Notes (Signed)
This patient scored at an elevated risk for obstructive sleep apnea using the STOP BANG TOOL during a presurgical testing, 

## 2015-02-26 ENCOUNTER — Other Ambulatory Visit: Payer: Self-pay | Admitting: Physician Assistant

## 2015-02-26 ENCOUNTER — Other Ambulatory Visit: Payer: Self-pay | Admitting: Radiology

## 2015-02-26 LAB — HEMOGLOBIN A1C
HEMOGLOBIN A1C: 5.8 % — AB (ref 4.8–5.6)
Mean Plasma Glucose: 120 mg/dL

## 2015-02-27 ENCOUNTER — Ambulatory Visit (HOSPITAL_COMMUNITY): Payer: Medicaid Other | Admitting: Certified Registered Nurse Anesthetist

## 2015-02-27 ENCOUNTER — Ambulatory Visit (HOSPITAL_COMMUNITY)
Admission: RE | Admit: 2015-02-27 | Discharge: 2015-02-27 | Disposition: A | Discharge status: Home / Self Care | Payer: Medicaid Other | Source: Ambulatory Visit | Attending: Interventional Radiology | Admitting: Interventional Radiology

## 2015-02-27 ENCOUNTER — Encounter (HOSPITAL_COMMUNITY): Payer: Self-pay

## 2015-02-27 ENCOUNTER — Ambulatory Visit (HOSPITAL_COMMUNITY): Payer: Medicaid Other | Admitting: Emergency Medicine

## 2015-02-27 ENCOUNTER — Encounter (HOSPITAL_COMMUNITY)
Admission: RE | Disposition: A | Discharge status: Home / Self Care | Payer: Self-pay | Source: Ambulatory Visit | Attending: Interventional Radiology

## 2015-02-27 ENCOUNTER — Encounter (HOSPITAL_COMMUNITY): Payer: Self-pay | Admitting: Certified Registered Nurse Anesthetist

## 2015-02-27 DIAGNOSIS — E785 Hyperlipidemia, unspecified: Secondary | ICD-10-CM | POA: Insufficient documentation

## 2015-02-27 DIAGNOSIS — J45909 Unspecified asthma, uncomplicated: Secondary | ICD-10-CM | POA: Insufficient documentation

## 2015-02-27 DIAGNOSIS — I671 Cerebral aneurysm, nonruptured: Secondary | ICD-10-CM | POA: Diagnosis not present

## 2015-02-27 DIAGNOSIS — E1122 Type 2 diabetes mellitus with diabetic chronic kidney disease: Secondary | ICD-10-CM | POA: Insufficient documentation

## 2015-02-27 DIAGNOSIS — K449 Diaphragmatic hernia without obstruction or gangrene: Secondary | ICD-10-CM | POA: Insufficient documentation

## 2015-02-27 DIAGNOSIS — I6521 Occlusion and stenosis of right carotid artery: Secondary | ICD-10-CM | POA: Insufficient documentation

## 2015-02-27 DIAGNOSIS — I729 Aneurysm of unspecified site: Secondary | ICD-10-CM

## 2015-02-27 DIAGNOSIS — N189 Chronic kidney disease, unspecified: Secondary | ICD-10-CM | POA: Insufficient documentation

## 2015-02-27 DIAGNOSIS — Z7902 Long term (current) use of antithrombotics/antiplatelets: Secondary | ICD-10-CM | POA: Insufficient documentation

## 2015-02-27 DIAGNOSIS — K219 Gastro-esophageal reflux disease without esophagitis: Secondary | ICD-10-CM | POA: Insufficient documentation

## 2015-02-27 DIAGNOSIS — Z87891 Personal history of nicotine dependence: Secondary | ICD-10-CM | POA: Insufficient documentation

## 2015-02-27 DIAGNOSIS — I639 Cerebral infarction, unspecified: Secondary | ICD-10-CM

## 2015-02-27 DIAGNOSIS — I129 Hypertensive chronic kidney disease with stage 1 through stage 4 chronic kidney disease, or unspecified chronic kidney disease: Secondary | ICD-10-CM | POA: Insufficient documentation

## 2015-02-27 DIAGNOSIS — J449 Chronic obstructive pulmonary disease, unspecified: Secondary | ICD-10-CM | POA: Diagnosis not present

## 2015-02-27 DIAGNOSIS — M1990 Unspecified osteoarthritis, unspecified site: Secondary | ICD-10-CM | POA: Insufficient documentation

## 2015-02-27 DIAGNOSIS — Z9689 Presence of other specified functional implants: Secondary | ICD-10-CM | POA: Insufficient documentation

## 2015-02-27 DIAGNOSIS — F319 Bipolar disorder, unspecified: Secondary | ICD-10-CM | POA: Insufficient documentation

## 2015-02-27 DIAGNOSIS — Z8673 Personal history of transient ischemic attack (TIA), and cerebral infarction without residual deficits: Secondary | ICD-10-CM | POA: Insufficient documentation

## 2015-02-27 HISTORY — PX: RADIOLOGY WITH ANESTHESIA: SHX6223

## 2015-02-27 LAB — PLATELET INHIBITION P2Y12: Platelet Function  P2Y12: 133 [PRU] — ABNORMAL LOW (ref 194–418)

## 2015-02-27 LAB — GLUCOSE, CAPILLARY
Glucose-Capillary: 118 mg/dL — ABNORMAL HIGH (ref 65–99)
Glucose-Capillary: 93 mg/dL (ref 65–99)

## 2015-02-27 SURGERY — RADIOLOGY WITH ANESTHESIA
Anesthesia: General

## 2015-02-27 MED ORDER — CLOPIDOGREL BISULFATE 75 MG PO TABS
37.5000 mg | ORAL_TABLET | Freq: Every day | ORAL | Status: DC
  Filled 2015-02-27: qty 0.5

## 2015-02-27 MED ORDER — CLOPIDOGREL BISULFATE 75 MG PO TABS: 75.0000 mg | ORAL_TABLET | Freq: Once | ORAL | Status: DC

## 2015-02-27 MED ORDER — IOHEXOL 300 MG/ML  SOLN
300.0000 mL | Freq: Once | INTRAMUSCULAR | Status: AC | PRN
  Administered 2015-02-27: 60 mL via INTRA_ARTERIAL

## 2015-02-27 MED ORDER — LIDOCAINE HCL 1 % IJ SOLN
INTRAMUSCULAR | Status: AC
  Filled 2015-02-27: qty 20

## 2015-02-27 MED ORDER — NIMODIPINE 30 MG PO CAPS
0.0000 mg | ORAL_CAPSULE | ORAL | Status: DC
  Filled 2015-02-27: qty 1

## 2015-02-27 MED ORDER — NITROGLYCERIN 1 MG/10 ML FOR IR/CATH LAB
INTRA_ARTERIAL | Status: AC
  Filled 2015-02-27: qty 10

## 2015-02-27 MED ORDER — CEFAZOLIN SODIUM-DEXTROSE 2-3 GM-% IV SOLR
2.0000 g | Freq: Once | INTRAVENOUS | Status: AC
  Administered 2015-02-27: 2 g via INTRAVENOUS
  Filled 2015-02-27: qty 50

## 2015-02-27 MED ORDER — FENTANYL CITRATE (PF) 100 MCG/2ML IJ SOLN
INTRAMUSCULAR | Status: DC | PRN
  Administered 2015-02-27: 50 ug via INTRAVENOUS

## 2015-02-27 MED ORDER — DEXAMETHASONE SODIUM PHOSPHATE 4 MG/ML IJ SOLN
INTRAMUSCULAR | Status: DC | PRN
  Administered 2015-02-27: 4 mg via INTRAVENOUS

## 2015-02-27 MED ORDER — ASPIRIN EC 325 MG PO TBEC: 325.0000 mg | DELAYED_RELEASE_TABLET | Freq: Once | ORAL | Status: DC

## 2015-02-27 MED ORDER — SODIUM CHLORIDE 0.9 % IV SOLN: INTRAVENOUS | Status: DC

## 2015-02-27 MED ORDER — LACTATED RINGERS IV SOLN
INTRAVENOUS | Status: DC | PRN
  Administered 2015-02-27: 08:00:00 via INTRAVENOUS

## 2015-02-27 MED ORDER — HEPARIN SODIUM (PORCINE) 1000 UNIT/ML IJ SOLN
INTRAMUSCULAR | Status: DC | PRN
  Administered 2015-02-27: 1 mL via INTRAVENOUS

## 2015-02-27 MED ORDER — ONDANSETRON HCL 4 MG/2ML IJ SOLN
INTRAMUSCULAR | Status: DC | PRN
  Administered 2015-02-27: 4 mg via INTRAVENOUS

## 2015-02-27 MED ORDER — SODIUM CHLORIDE 0.9 % IV SOLN: Freq: Once | INTRAVENOUS | Status: DC

## 2015-02-27 MED ORDER — MIDAZOLAM HCL 5 MG/5ML IJ SOLN
INTRAMUSCULAR | Status: DC | PRN
  Administered 2015-02-27 (×2): 1 mg via INTRAVENOUS

## 2015-02-27 MED ORDER — ASPIRIN 81 MG PO CHEW
243.0000 mg | CHEWABLE_TABLET | Freq: Once | ORAL | Status: DC
  Filled 2015-02-27: qty 3

## 2015-02-27 NOTE — Sedation Documentation (Signed)
Pt tolerated procedure well. Exoseal applied 65F, pressure applied by IR tech [employer] Hines

## 2015-02-27 NOTE — H&P (Signed)
Chief Complaint: "I'm here for an angiogram and stent"  HPI: Mariea ClontsRichard L Mcglothen is an 54 y.o. male with known Right internal carotid artery stenosis. He has been on Plavix and ASA in preparation for stent angioplasty. He is scheduled today for procedure. Has been feeling well, no neurologic symptoms. No recent fevers, illness, N/V, abd pain, dysuria, diarrhea. Has been NPO this am  Past Medical History:  Past Medical History  Diagnosis Date  . Stroke     2015  . H/O hiatal hernia   . Asthma     Last asthma attack at age 89; History of trach at 16 months  . Hypertension     takes Ramipril daily  . Hyperlipidemia     takes Ramipril daily  . Diabetes mellitus without complication     takes Metformin daily  . COPD (chronic obstructive pulmonary disease) 06/2012    uses Spiriva and Albuterol daily as needed  . Pneumonia     hx of-2014  . History of bronchitis 2014  . Headache(784.0)     "all the time"  . Aneurysm     brain  . Umbilical hernia   . Numbness     both arms   . Burning pain     in both legs-seeing Dr.Sethi for this  . Arthritis   . Joint pain   . Joint swelling   . Back pain     reason unknown   . GERD (gastroesophageal reflux disease)     was on Nexium;taking Omeprazole daily as needed  . Nocturia   . Chronic kidney disease 2009    chronic kidney disease  . Bipolar disorder     was on meds but was taken off 2 yrs ago and none since  . Complication of anesthesia   . PONV (postoperative nausea and vomiting)     Pt reports nausea only.    Past Surgical History:  Past Surgical History  Procedure Laterality Date  . Tracheostomy closure    . Tracheostomy      at age 53 months old  . Rectal surgery    . Rotator cuff repair Left 1996  . Radiology with anesthesia N/A 08/08/2014    Procedure: RADIOLOGY WITH ANESTHESIA EMBOLIZATION;  Surgeon: Medication Radiologist, MD;  Location: [facility] OR;  Service: Radiology;  Laterality: N/A;  . Knee arthroscopy Right   . Cyst  removed from right wrist    . Angiogram with coiling    . Colonoscopy    . Radiology with anesthesia N/A 11/21/2014    Procedure: RADIOLOGY WITH ANESTHESIA;  Surgeon: Oneal GroutSanjeev K Deveshwar, MD;  Location: [facility] OR;  Service: Radiology;  Laterality: N/A;    Family History:  Family History  Problem Relation Age of Onset  . Heart disease Mother     s/p 3V CABG  . Cancer Father [id number]    lung cancer; +tobacco  . Cancer Maternal Grandmother   . Heart disease Maternal Grandmother   . Cancer Paternal Grandmother   . Lupus Sister   . Multiple sclerosis Sister   . Anemia Daughter   . Stroke Father     Social History:  reports that he quit smoking about 9 months ago. His smoking use included Cigarettes. He started smoking about 40 years ago. He smoked 1.00 pack per day. He has never used smokeless tobacco. He reports that he drinks alcohol. He reports that he does not use illicit drugs.  Allergies:  Allergies  Allergen Reactions  . Tylox [Oxycodone-Acetaminophen] Hives and  Other (See Comments)    Reaction: Tremors and diaphoresis   . Adhesive [Tape] Rash    Medications:   Medication List    Notice    This visit is during an admission. Changes to the med list made in this visit will be reflected in the After Visit Summary of the admission.      Please HPI for pertinent positives, otherwise complete 10 system ROS negative.  Physical Exam: T: 97.6, HR: 56, RR: 18, BP: 125/75   General Appearance:  Alert, cooperative, no distress, appears stated age  Head:  Normocephalic, without obvious abnormality, atraumatic  ENT: Unremarkable  Neck: Supple, symmetrical, trachea midline  Lungs:   Clear to auscultation bilaterally, no w/r/r, respirations unlabored without use of accessory muscles.  Chest Wall:  No tenderness or deformity  Heart:  Regular rate and rhythm, S1, S2 normal, no murmur, rub or gallop.  Abdomen:   Soft, non-tender, non distended.  Extremities: Extremities normal,  atraumatic, no cyanosis or edema  Pulses: 2+ and symmetric femoral and pedal  Neurologic: Normal affect, no gross deficits.  Labs: Results for orders placed or performed during the hospital encounter of 02/27/15 (from the past 48 hour(s))  Glucose, capillary     Status: Abnormal   Collection Time: 02/27/15  7:27 AM  Result Value Ref Range   Glucose-Capillary 118 (H) 65 - 99 mg/dL   CBC    Component Value Date/Time   WBC 5.5 02/25/2015 0843   RBC 4.72 02/25/2015 0843   HGB 13.9 02/25/2015 0843   HCT 41.1 02/25/2015 0843   PLT 208 02/25/2015 0843   MCV 87.1 02/25/2015 0843   MCH 29.4 02/25/2015 0843   MCHC 33.8 02/25/2015 0843   RDW 12.4 02/25/2015 0843   LYMPHSABS 2.0 02/25/2015 0843   MONOABS 0.4 02/25/2015 0843   EOSABS 0.2 02/25/2015 0843   BASOSABS 0.0 02/25/2015 0843    BMET    Component Value Date/Time   NA 139 02/25/2015 0843   K 4.2 02/25/2015 0843   CL 104 02/25/2015 0843   CO2 28 02/25/2015 0843   GLUCOSE 92 02/25/2015 0843   BUN 12 02/25/2015 0843   CREATININE 0.78 02/25/2015 0843   CALCIUM 9.2 02/25/2015 0843   GFRNONAA >60 02/25/2015 0843   GFRAA >60 02/25/2015 0843    INR/Prothrombin Time 12.3/0.9  P2Y12 Pending  Assessment/Plan (R)ICA stenosis For angiogram with intended stent angioplasty Reviewed procedure, risks, complications, plan for overnight admission for observation. Await P2Y12 level. Consent signed in chart  Brayton ElBRUNING, Dariya Gainer PA-C 02/27/2015, 8:01 AM

## 2015-02-27 NOTE — Anesthesia Preprocedure Evaluation (Addendum)
Anesthesia Evaluation  Patient identified by MRN, date of birth, ID band Patient awake    Reviewed: Allergy & Precautions, H&P , NPO status , Patient's Chart, lab work & pertinent test results  History of Anesthesia Complications (+) PONV  Airway Mallampati: II  TM Distance: >3 FB Neck ROM: Full    Dental no notable dental hx. (+) Edentulous Upper, Edentulous Lower, Dental Advisory Given   Pulmonary asthma , COPD COPD inhaler, former smoker,  breath sounds clear to auscultation  Pulmonary exam normal       Cardiovascular hypertension, Pt. on medications + Peripheral Vascular Disease Rhythm:Regular Rate:Normal     Neuro/Psych  Headaches, Bipolar Disorder TIACVA    GI/Hepatic Neg liver ROS, hiatal hernia, GERD-  Medicated and Controlled,  Endo/Other  diabetes, Type 2, Oral Hypoglycemic Agents  Renal/GU Renal disease  negative genitourinary   Musculoskeletal  (+) Arthritis -, Osteoarthritis,    Abdominal   Peds  Hematology negative hematology ROS (+)   Anesthesia Other Findings   Reproductive/Obstetrics negative OB ROS                           Anesthesia Physical Anesthesia Plan  ASA: III  Anesthesia Plan: General   Post-op Pain Management:    Induction: Intravenous  Airway Management Planned: Oral ETT  Additional Equipment: Arterial line  Intra-op Plan:   Post-operative Plan: Extubation in OR  Informed Consent: I have reviewed the patients History and Physical, chart, labs and discussed the procedure including the risks, benefits and alternatives for the proposed anesthesia with the patient or authorized representative who has indicated his/her understanding and acceptance.   Dental advisory given  Plan Discussed with: CRNA  Anesthesia Plan Comments:         Anesthesia Quick Evaluation

## 2015-02-27 NOTE — Sedation Documentation (Signed)
Pt reports that he did take aspirin and plavix before leaving home this morning

## 2015-02-27 NOTE — Anesthesia Postprocedure Evaluation (Signed)
  Anesthesia Post-op Note  Patient: [name]  Procedure(s) Performed: Procedure(s): RADIOLOGY WITH ANESTHESIA (N/A)  Patient Location: PACU  Anesthesia Type: MAC  Level of Consciousness: awake and alert   Airway and Oxygen Therapy: Patient Spontanous Breathing  Post-op Pain: none  Post-op Assessment: Post-op Vital signs reviewed, Patient's Cardiovascular Status Stable and Respiratory Function Stable  Post-op Vital Signs: Reviewed  Filed Vitals:   02/27/15 1053  BP: 149/81  Pulse: 56  Temp: 36.4 C  Resp: 18    Complications: No apparent anesthesia complications

## 2015-02-27 NOTE — Transfer of Care (Signed)
Immediate Anesthesia Transfer of Care Note  Patient: [name]  Procedure(s) Performed: Procedure(s): RADIOLOGY WITH ANESTHESIA (N/A)  Patient Location: PACU and Short Stay  Anesthesia Type:MAC  Level of Consciousness: awake, alert  and oriented  Airway & Oxygen Therapy: Patient Spontanous Breathing  Post-op Assessment: Report given to RN, Post -op Vital signs reviewed and stable and Patient moving all extremities X 4  Post vital signs: Reviewed and stable  Last Vitals:  Filed Vitals:   02/27/15 1053  BP: 149/81  Pulse: 56  Temp: 36.4 C  Resp: 18    Complications: No apparent anesthesia complications

## 2015-02-27 NOTE — Progress Notes (Signed)
Per Maylene RoesKevin Bruner PA he stated patient did not need nimodipine.  SBP 125 and HR 56.

## 2015-02-27 NOTE — Procedures (Signed)
S/P bilateral common carotid arteriograms,and rt vert arteiogram. RT CFA approach. Findings. 1..Obliterated Lt MCA aneurysms. 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 3.Interval marginal improvement  Of RT ICA prox stenosis with remodeling at the bulb

## 2015-02-27 NOTE — Discharge Instructions (Signed)

## 2015-02-28 ENCOUNTER — Encounter (HOSPITAL_COMMUNITY): Payer: Self-pay | Admitting: Interventional Radiology

## 2015-03-13 ENCOUNTER — Ambulatory Visit: Payer: Medicaid Other | Admitting: Neurology

## 2015-04-08 ENCOUNTER — Other Ambulatory Visit: Payer: Self-pay

## 2015-06-09 ENCOUNTER — Emergency Department (HOSPITAL_COMMUNITY)
Admission: EM | Admit: 2015-06-09 | Discharge: 2015-06-09 | Disposition: A | Discharge status: Home / Self Care | Payer: Medicaid Other | Attending: Emergency Medicine | Admitting: Emergency Medicine

## 2015-06-09 ENCOUNTER — Encounter (HOSPITAL_COMMUNITY): Payer: Self-pay | Admitting: *Deleted

## 2015-06-09 DIAGNOSIS — I872 Venous insufficiency (chronic) (peripheral): Secondary | ICD-10-CM | POA: Diagnosis not present

## 2015-06-09 DIAGNOSIS — J449 Chronic obstructive pulmonary disease, unspecified: Secondary | ICD-10-CM | POA: Insufficient documentation

## 2015-06-09 DIAGNOSIS — Z87891 Personal history of nicotine dependence: Secondary | ICD-10-CM | POA: Diagnosis not present

## 2015-06-09 DIAGNOSIS — E119 Type 2 diabetes mellitus without complications: Secondary | ICD-10-CM | POA: Diagnosis not present

## 2015-06-09 DIAGNOSIS — M79605 Pain in left leg: Secondary | ICD-10-CM | POA: Diagnosis present

## 2015-06-09 DIAGNOSIS — Z8701 Personal history of pneumonia (recurrent): Secondary | ICD-10-CM | POA: Insufficient documentation

## 2015-06-09 DIAGNOSIS — Z8673 Personal history of transient ischemic attack (TIA), and cerebral infarction without residual deficits: Secondary | ICD-10-CM | POA: Diagnosis not present

## 2015-06-09 DIAGNOSIS — Z79899 Other long term (current) drug therapy: Secondary | ICD-10-CM | POA: Insufficient documentation

## 2015-06-09 DIAGNOSIS — M79604 Pain in right leg: Secondary | ICD-10-CM | POA: Diagnosis not present

## 2015-06-09 DIAGNOSIS — F319 Bipolar disorder, unspecified: Secondary | ICD-10-CM | POA: Diagnosis not present

## 2015-06-09 DIAGNOSIS — E785 Hyperlipidemia, unspecified: Secondary | ICD-10-CM | POA: Diagnosis not present

## 2015-06-09 DIAGNOSIS — N189 Chronic kidney disease, unspecified: Secondary | ICD-10-CM | POA: Diagnosis not present

## 2015-06-09 DIAGNOSIS — I129 Hypertensive chronic kidney disease with stage 1 through stage 4 chronic kidney disease, or unspecified chronic kidney disease: Secondary | ICD-10-CM | POA: Diagnosis not present

## 2015-06-09 DIAGNOSIS — Z7982 Long term (current) use of aspirin: Secondary | ICD-10-CM | POA: Insufficient documentation

## 2015-06-09 DIAGNOSIS — Z791 Long term (current) use of non-steroidal anti-inflammatories (NSAID): Secondary | ICD-10-CM | POA: Insufficient documentation

## 2015-06-09 LAB — I-STAT CHEM 8, ED
BUN: 19 mg/dL (ref 6–20)
CHLORIDE: 100 mmol/L — AB (ref 101–111)
Calcium, Ion: 1.18 mmol/L (ref 1.12–1.23)
Creatinine, Ser: 0.9 mg/dL (ref 0.61–1.24)
Glucose, Bld: 104 mg/dL — ABNORMAL HIGH (ref 65–99)
HCT: 43 % (ref 39.0–52.0)
Hemoglobin: 14.6 g/dL (ref 13.0–17.0)
POTASSIUM: 3.9 mmol/L (ref 3.5–5.1)
SODIUM: 140 mmol/L (ref 135–145)
TCO2: 27 mmol/L (ref 0–100)

## 2015-06-09 NOTE — Discharge Instructions (Signed)

## 2015-06-09 NOTE — ED Provider Notes (Signed)
CSN: [id number]     Arrival date & time 06/09/15  1754 History  This chart was scribed for non-physician provider Roxy Horseman, PA-C, working with Laurence Spates, MD by Phillis Haggis, ED Scribe. This patient was seen in room TR09C/TR09C and patient care was started at 7:34 PM.   Chief Complaint  Patient presents with  . Leg Pain   The history is provided by the patient. No language interpreter was used.  HPI Comments: [name] is a 54 y.o. Male with hx of DM, HTN, hyperlipidemia, COPD, brain aneurysm, and stroke who presents to the Emergency Department complaining of burning, stinging, gradually worsening bilateral lower leg pain onset one week ago. He reports worsening pain with palpation of the skin around the ankles and movement of the feet and ankles. He states that when the area is palpated, he reports burning pain. Pt reports associated red discoloration to the lower legs that starts at the ankles and have been radiating up his calves. Pt reports taking Plavix, Gabapentin, Metformin and Ramipril daily. He denies fever or chills. Pt states that he quit smoking last year. Reports family hx of blockages in the legs with stents placed.   PCP: Aura Dials, MD  Past Medical History  Diagnosis Date  . Stroke     2015  . H/O hiatal hernia   . Asthma     Last asthma attack at age 28; History of trach at 16 months  . Hypertension     takes Ramipril daily  . Hyperlipidemia     takes Ramipril daily  . Diabetes mellitus without complication     takes Metformin daily  . COPD (chronic obstructive pulmonary disease) 06/2012    uses Spiriva and Albuterol daily as needed  . Pneumonia     hx of-2014  . History of bronchitis 2014  . Headache(784.0)     "all the time"  . Aneurysm     brain  . Umbilical hernia   . Numbness     both arms   . Burning pain     in both legs-seeing Dr.Sethi for this  . Arthritis   . Joint pain   . Joint swelling   . Back pain     reason unknown    . GERD (gastroesophageal reflux disease)     was on Nexium;taking Omeprazole daily as needed  . Nocturia   . Chronic kidney disease 2009    chronic kidney disease  . Bipolar disorder     was on meds but was taken off 2 yrs ago and none since  . Complication of anesthesia   . PONV (postoperative nausea and vomiting)     Pt reports nausea only.   Past Surgical History  Procedure Laterality Date  . Tracheostomy closure    . Tracheostomy      at age 59 months old  . Rectal surgery    . Rotator cuff repair Left 1996  . Radiology with anesthesia N/A 08/08/2014    Procedure: RADIOLOGY WITH ANESTHESIA EMBOLIZATION;  Surgeon: Medication Radiologist, MD;  Location: [facility] OR;  Service: Radiology;  Laterality: N/A;  . Knee arthroscopy Right   . Cyst removed from right wrist    . Angiogram with coiling    . Colonoscopy    . Radiology with anesthesia N/A 11/21/2014    Procedure: RADIOLOGY WITH ANESTHESIA;  Surgeon: Oneal Grout, MD;  Location: [facility] OR;  Service: Radiology;  Laterality: N/A;  . Radiology with anesthesia N/A 02/27/2015  Procedure: RADIOLOGY WITH ANESTHESIA;  Surgeon: [name], MD;  Location: [facility] OR;  Service: Radiology;  Laterality: N/A;   Family History  Problem Relation Age of Onset  . Heart disease Mother     s/p 3V CABG  . Cancer Father 18    lung cancer; +tobacco  . Cancer Maternal Grandmother   . Heart disease Maternal Grandmother   . Cancer Paternal Grandmother   . Lupus Sister   . Multiple sclerosis Sister   . Anemia Daughter   . Stroke Father    Social History  Substance Use Topics  . Smoking status: Former Smoker -- 1.00 packs/day    Types: Cigarettes    Start date: 01/15/1975    Quit date: 05/12/2014  . Smokeless tobacco: Never Used     Comment: quit smoking in Aug 2015  . Alcohol Use: 0.0 oz/week    0 Standard drinks or equivalent per week     Comment: only on Wed    Review of Systems  Constitutional: Negative for fever and chills.   Cardiovascular: Positive for leg swelling.  Musculoskeletal: Positive for arthralgias.  Skin: Positive for color change.    Allergies  Tylox and Adhesive  Home Medications   Prior to Admission medications   Medication Sig Start Date End Date Taking? Authorizing Provider  albuterol (PROVENTIL HFA;VENTOLIN HFA) 108 (90 BASE) MCG/ACT inhaler Inhale 2 puffs into the lungs every 6 (six) hours as needed for wheezing or shortness of breath.     Historical Provider, MD  aspirin EC 81 MG tablet Take 81 mg by mouth every other day.    Historical Provider, MD  atorvastatin (LIPITOR) 20 MG tablet Take 1 tablet (20 mg total) by mouth daily at 6 PM. 07/06/14   Calvert Cantor, MD  clopidogrel (PLAVIX) 75 MG tablet Take 0.5 tablets (37.5 mg total) by mouth daily. Patient taking differently: Take 37.5 mg by mouth every other day.  08/14/14   Jeralyn Bennett, MD  esomeprazole (NEXIUM) 40 MG capsule Take 40 mg by mouth at bedtime as needed.     Historical Provider, MD  gabapentin (NEURONTIN) 100 MG capsule TAKE 1 CAPSULE BY MOUTH THREE TIMES DAILY 01/09/15   Marvel Plan, MD  loratadine (CLARITIN) 10 MG tablet Take 10 mg by mouth daily as needed for allergies.  12/31/14 12/31/15  Historical Provider, MD  meloxicam (MOBIC) 15 MG tablet Take 15 mg by mouth daily. 01/23/15   Historical Provider, MD  metFORMIN (GLUCOPHAGE) 500 MG tablet Take 1 tablet (500 mg total) by mouth 2 (two) times daily with a meal. 07/06/14   Calvert Cantor, MD  nitroGLYCERIN (NITROSTAT) 0.4 MG SL tablet Place 1 tablet (0.4 mg total) under the tongue every 5 (five) minutes x 3 doses as needed for chest pain. 07/08/14   Orpah Cobb, MD  ramipril (ALTACE) 2.5 MG capsule Take 2.5 mg by mouth daily.    Historical Provider, MD  tiotropium (SPIRIVA HANDIHALER) 18 MCG inhalation capsule Place 1 capsule (18 mcg total) into inhaler and inhale every morning. Patient taking differently: Place 18 mcg into inhaler and inhale as needed (for shortness of breath).   08/14/14   Jeralyn Bennett, MD  triamcinolone cream (KENALOG) 0.1 % Apply 1 application topically 2 (two) times daily.    Historical Provider, MD   BP 131/74 mmHg  Pulse 82  Temp(Src) 98.5 F (36.9 C) (Oral)  Resp 20  Ht 5\' 8"  (1.727 m)  Wt 241 lb (109.317 kg)  BMI 36.65 kg/m2  SpO2 97%  Physical Exam  Constitutional: He is oriented to person, place, and time. He appears well-developed and well-nourished. No distress.  HENT:  Head: Normocephalic and atraumatic.  Eyes: Conjunctivae and EOM are normal.  Neck: Normal range of motion. Neck supple.  Cardiovascular: Normal rate, regular rhythm, normal heart sounds and intact distal pulses.   Intact distal pulses with brisk capillary refill  Pulmonary/Chest: Effort normal and breath sounds normal.  Musculoskeletal: Normal range of motion. He exhibits no edema.  Neurological: He is alert and oriented to person, place, and time.  Sensation intact  Skin: Skin is warm and dry.  Mild bilateral lower extremity venous stasis dermatitis, no evidence of abscess or cellulitis  Psychiatric: He has a normal mood and affect. His behavior is normal.  Nursing note and vitals reviewed.   ED Course  Procedures (including critical care time) DIAGNOSTIC STUDIES: Oxygen Saturation is 97% on RA, normal by my interpretation.    COORDINATION OF CARE: 7:38 PM-Discussed treatment plan which includes follow up with PCP and elevation with pt at bedside and pt agreed to plan.   Labs Review Labs Reviewed  I-STAT CHEM 8, ED - Abnormal; Notable for the following:    Chloride 100 (*)    Glucose, Bld 104 (*)    All other components within normal limits    Imaging Review No results found.    EKG Interpretation None      MDM   Final diagnoses:  Venous stasis dermatitis of both lower extremities    Patient with mild venous stasis dermatitis of bilateral lower extremities, no evidence of cellulitis, patient has intact distal pulses, his feet are  warm, he has good capillary refill, there are no range of motion or strength deficits. Will recommend the patient follow-up with his primary care provider. Patient understands and agrees with plan.  I personally performed the services described in this documentation, which was scribed in my presence. The recorded information has been reviewed and is accurate.     Roxy Horseman, PA-C 06/09/15 1954  Laurence Spates, MD 06/09/15 [phone]

## 2015-06-09 NOTE — ED Notes (Signed)
Labs drew by Charise Carwin, EMT

## 2015-06-09 NOTE — ED Notes (Addendum)
Pt is diabetic and has leg pain (burning) and red discoloration that began a week ago and has been getting worse

## 2015-06-18 DIAGNOSIS — I872 Venous insufficiency (chronic) (peripheral): Secondary | ICD-10-CM | POA: Insufficient documentation

## 2015-06-24 ENCOUNTER — Emergency Department (HOSPITAL_COMMUNITY)
Admission: EM | Admit: 2015-06-24 | Discharge: 2015-06-24 | Disposition: A | Discharge status: Home / Self Care | Payer: Medicaid Other | Attending: Emergency Medicine | Admitting: Emergency Medicine

## 2015-06-24 ENCOUNTER — Encounter (HOSPITAL_COMMUNITY): Payer: Self-pay | Admitting: Family Medicine

## 2015-06-24 DIAGNOSIS — J449 Chronic obstructive pulmonary disease, unspecified: Secondary | ICD-10-CM | POA: Diagnosis not present

## 2015-06-24 DIAGNOSIS — Y998 Other external cause status: Secondary | ICD-10-CM | POA: Diagnosis not present

## 2015-06-24 DIAGNOSIS — Z8701 Personal history of pneumonia (recurrent): Secondary | ICD-10-CM | POA: Insufficient documentation

## 2015-06-24 DIAGNOSIS — S51811A Laceration without foreign body of right forearm, initial encounter: Secondary | ICD-10-CM | POA: Diagnosis present

## 2015-06-24 DIAGNOSIS — I129 Hypertensive chronic kidney disease with stage 1 through stage 4 chronic kidney disease, or unspecified chronic kidney disease: Secondary | ICD-10-CM | POA: Insufficient documentation

## 2015-06-24 DIAGNOSIS — E785 Hyperlipidemia, unspecified: Secondary | ICD-10-CM | POA: Diagnosis not present

## 2015-06-24 DIAGNOSIS — K219 Gastro-esophageal reflux disease without esophagitis: Secondary | ICD-10-CM | POA: Diagnosis not present

## 2015-06-24 DIAGNOSIS — Z8673 Personal history of transient ischemic attack (TIA), and cerebral infarction without residual deficits: Secondary | ICD-10-CM | POA: Diagnosis not present

## 2015-06-24 DIAGNOSIS — Z7952 Long term (current) use of systemic steroids: Secondary | ICD-10-CM | POA: Diagnosis not present

## 2015-06-24 DIAGNOSIS — Z79899 Other long term (current) drug therapy: Secondary | ICD-10-CM | POA: Diagnosis not present

## 2015-06-24 DIAGNOSIS — E119 Type 2 diabetes mellitus without complications: Secondary | ICD-10-CM | POA: Insufficient documentation

## 2015-06-24 DIAGNOSIS — Y9289 Other specified places as the place of occurrence of the external cause: Secondary | ICD-10-CM | POA: Diagnosis not present

## 2015-06-24 DIAGNOSIS — Y9389 Activity, other specified: Secondary | ICD-10-CM | POA: Insufficient documentation

## 2015-06-24 DIAGNOSIS — Z7982 Long term (current) use of aspirin: Secondary | ICD-10-CM | POA: Insufficient documentation

## 2015-06-24 DIAGNOSIS — Z87891 Personal history of nicotine dependence: Secondary | ICD-10-CM | POA: Insufficient documentation

## 2015-06-24 DIAGNOSIS — Y288XXA Contact with other sharp object, undetermined intent, initial encounter: Secondary | ICD-10-CM | POA: Diagnosis not present

## 2015-06-24 DIAGNOSIS — S50811A Abrasion of right forearm, initial encounter: Secondary | ICD-10-CM | POA: Insufficient documentation

## 2015-06-24 DIAGNOSIS — N189 Chronic kidney disease, unspecified: Secondary | ICD-10-CM | POA: Insufficient documentation

## 2015-06-24 DIAGNOSIS — F319 Bipolar disorder, unspecified: Secondary | ICD-10-CM | POA: Diagnosis not present

## 2015-06-24 DIAGNOSIS — T148XXA Other injury of unspecified body region, initial encounter: Secondary | ICD-10-CM

## 2015-06-24 MED ORDER — TETANUS-DIPHTH-ACELL PERTUSSIS 5-2.5-18.5 LF-MCG/0.5 IM SUSP: 0.5000 mL | Freq: Once | INTRAMUSCULAR | Status: DC

## 2015-06-24 NOTE — Discharge Instructions (Signed)
Tissue Adhesive Wound Care °Some cuts, wounds, lacerations, and incisions can be repaired by using tissue adhesive. Tissue adhesive is like glue. It holds the skin together, allowing for faster healing. It forms a strong bond on the skin in about 1 minute and reaches its full strength in about 2 or 3 minutes. The adhesive disappears naturally while the wound is healing. It is important to take proper care of your wound at home while it heals.  °HOME CARE INSTRUCTIONS  °· Showers are allowed. Do not soak the area containing the tissue adhesive. Do not take baths, swim, or use hot tubs. Do not use any soaps or ointments on the wound. Certain ointments can weaken the glue. °· If a bandage (dressing) has been applied, follow your health care provider's instructions for how often to change the dressing.   °· Keep the dressing dry if one has been applied.   °· Do not scratch, pick, or rub the adhesive.   °· Do not place tape over the adhesive. The adhesive could come off when pulling the tape off.   °· Protect the wound from further injury until it is healed.   °· Protect the wound from sun and tanning bed exposure while it is healing and for several weeks after healing.   °· Only take over-the-counter or prescription medicines as directed by your health care provider.   °· Keep all follow-up appointments as directed by your health care provider. °SEEK IMMEDIATE MEDICAL CARE IF:  °· Your wound becomes red, swollen, hot, or tender.   °· You develop a rash after the glue is applied. °· You have increasing pain in the wound.   °· You have a red streak that goes away from the wound.   °· You have pus coming from the wound.   °· You have increased bleeding. °· You have a fever. °· You have shaking chills.   °· You notice a bad smell coming from the wound.   °· Your wound or adhesive breaks open.   °MAKE SURE YOU:  °· Understand these instructions. °· Will watch your condition. °· Will get help right away if you are not doing  well or get worse. °Document Released: 03/24/2001 Document Revised: 07/19/2013 Document Reviewed: 04/19/2013 °[employer]® Patient Information ©2015 [employer]. This information is not intended to replace advice given to you by your health care provider. Make sure you discuss any questions you have with your health care provider. ° °

## 2015-06-24 NOTE — ED Notes (Signed)
dermabond and steri strips applied by EDPA

## 2015-06-24 NOTE — ED Provider Notes (Signed)
CSN: [id number]     Arrival date & time 06/24/15  1457 History  This chart was scribed for Cheri Fowler, PA-C, working with Vanetta Mulders, MD by Elon Spanner, ED Scribe. This patient was seen in room TR07C/TR07C and the patient's care was started at 4:05 PM.  Chief Complaint  Patient presents with  . Laceration   The history is provided by the patient. No language interpreter was used.   HPI Comments: [name] is a 54 y.o. male with hx of stroke (2015, on Plavix) who presents to the Emergency Department complaining of a moderately painful 4 cm laceration on the right forearm that occurred 1.5 hours ago (bleeding controlled).  The patient grazed his arm against a chain link fence.  He denies distal numbness/weakness or loss of sensory/motor function.  Pt reports last tetanus 2012.    Past Medical History  Diagnosis Date  . Stroke     2015  . H/O hiatal hernia   . Asthma     Last asthma attack at age 19; History of trach at 16 months  . Hypertension     takes Ramipril daily  . Hyperlipidemia     takes Ramipril daily  . Diabetes mellitus without complication     takes Metformin daily  . COPD (chronic obstructive pulmonary disease) 06/2012    uses Spiriva and Albuterol daily as needed  . Pneumonia     hx of-2014  . History of bronchitis 2014  . Headache(784.0)     "all the time"  . Aneurysm     brain  . Umbilical hernia   . Numbness     both arms   . Burning pain     in both legs-seeing Dr.Sethi for this  . Arthritis   . Joint pain   . Joint swelling   . Back pain     reason unknown   . GERD (gastroesophageal reflux disease)     was on Nexium;taking Omeprazole daily as needed  . Nocturia   . Chronic kidney disease 2009    chronic kidney disease  . Bipolar disorder     was on meds but was taken off 2 yrs ago and none since  . Complication of anesthesia   . PONV (postoperative nausea and vomiting)     Pt reports nausea only.   Past Surgical History  Procedure  Laterality Date  . Tracheostomy closure    . Tracheostomy      at age 21 months old  . Rectal surgery    . Rotator cuff repair Left 1996  . Radiology with anesthesia N/A 08/08/2014    Procedure: RADIOLOGY WITH ANESTHESIA EMBOLIZATION;  Surgeon: Medication Radiologist, MD;  Location: [facility] OR;  Service: Radiology;  Laterality: N/A;  . Knee arthroscopy Right   . Cyst removed from right wrist    . Angiogram with coiling    . Colonoscopy    . Radiology with anesthesia N/A 11/21/2014    Procedure: RADIOLOGY WITH ANESTHESIA;  Surgeon: Oneal Grout, MD;  Location: [facility] OR;  Service: Radiology;  Laterality: N/A;  . Radiology with anesthesia N/A 02/27/2015    Procedure: RADIOLOGY WITH ANESTHESIA;  Surgeon: [name], MD;  Location: [facility] OR;  Service: Radiology;  Laterality: N/A;   Family History  Problem Relation Age of Onset  . Heart disease Mother     s/p 3V CABG  . Cancer Father 76    lung cancer; +tobacco  . Cancer Maternal Grandmother   . Heart disease  Maternal Grandmother   . Cancer Paternal Grandmother   . Lupus Sister   . Multiple sclerosis Sister   . Anemia Daughter   . Stroke Father    Social History  Substance Use Topics  . Smoking status: Former Smoker -- 1.00 packs/day    Types: Cigarettes    Start date: 01/15/1975    Quit date: 05/12/2014  . Smokeless tobacco: Never Used     Comment: quit smoking in Aug 2015  . Alcohol Use: 0.0 oz/week    0 Standard drinks or equivalent per week     Comment: only on Wed    Review of Systems A complete 10 system review of systems was obtained and all systems are negative except as noted in the HPI and PMH.   Allergies  Tylox and Adhesive  Home Medications   Prior to Admission medications   Medication Sig Start Date End Date Taking? Authorizing Provider  albuterol (PROVENTIL HFA;VENTOLIN HFA) 108 (90 BASE) MCG/ACT inhaler Inhale 2 puffs into the lungs every 6 (six) hours as needed for wheezing or shortness of breath.      Historical Provider, MD  aspirin EC 81 MG tablet Take 81 mg by mouth every other day.    Historical Provider, MD  atorvastatin (LIPITOR) 20 MG tablet Take 1 tablet (20 mg total) by mouth daily at 6 PM. 07/06/14   Calvert Cantor, MD  clopidogrel (PLAVIX) 75 MG tablet Take 0.5 tablets (37.5 mg total) by mouth daily. Patient taking differently: Take 37.5 mg by mouth every other day.  08/14/14   Jeralyn Bennett, MD  esomeprazole (NEXIUM) 40 MG capsule Take 40 mg by mouth at bedtime as needed.     Historical Provider, MD  gabapentin (NEURONTIN) 100 MG capsule TAKE 1 CAPSULE BY MOUTH THREE TIMES DAILY 01/09/15   Marvel Plan, MD  loratadine (CLARITIN) 10 MG tablet Take 10 mg by mouth daily as needed for allergies.  12/31/14 12/31/15  Historical Provider, MD  meloxicam (MOBIC) 15 MG tablet Take 15 mg by mouth daily. 01/23/15   Historical Provider, MD  metFORMIN (GLUCOPHAGE) 500 MG tablet Take 1 tablet (500 mg total) by mouth 2 (two) times daily with a meal. 07/06/14   Calvert Cantor, MD  nitroGLYCERIN (NITROSTAT) 0.4 MG SL tablet Place 1 tablet (0.4 mg total) under the tongue every 5 (five) minutes x 3 doses as needed for chest pain. 07/08/14   Orpah Cobb, MD  ramipril (ALTACE) 2.5 MG capsule Take 2.5 mg by mouth daily.    Historical Provider, MD  tiotropium (SPIRIVA HANDIHALER) 18 MCG inhalation capsule Place 1 capsule (18 mcg total) into inhaler and inhale every morning. Patient taking differently: Place 18 mcg into inhaler and inhale as needed (for shortness of breath).  08/14/14   Jeralyn Bennett, MD  triamcinolone cream (KENALOG) 0.1 % Apply 1 application topically 2 (two) times daily.    Historical Provider, MD   BP 111/78 mmHg  Pulse 73  Temp(Src) 97.8 F (36.6 C) (Oral)  Ht  (1.727 m)  Wt 241 lb 5 oz (109.459 kg)  BMI 36.70 kg/m2  SpO2 95% Physical Exam  Constitutional: He is oriented to person, place, and time. He appears well-developed and well-nourished. No distress.  HENT:  Head:  Normocephalic and atraumatic.  Eyes: Conjunctivae are normal.  Neck: Neck supple. No tracheal deviation present.  Cardiovascular: Normal rate and intact distal pulses.   Capillary refill less than 3 seconds.  Pulmonary/Chest: Effort normal. No respiratory distress.  Musculoskeletal: Normal range  of motion.  FROM of right elbow, right wrist, and upper extremity digits.  Neurological: He is alert and oriented to person, place, and time.  Sensation intact in all upper extremity digits.  Strength intact throughout upper extremities.    Skin: Skin is warm and dry.     Psychiatric: He has a normal mood and affect. His behavior is normal.  Nursing note and vitals reviewed.   ED Course  Procedures (including critical care time)  The wound is cleansed, debrided of foreign material as much as possible, and dressed. The patient is alerted to watch for any signs of infection (redness, pus, pain, increased swelling or fever) and call if such occurs. Home wound care instructions are provided. Tetanus vaccination status reviewed: Td vaccination indicated and given today.  LACERATION REPAIR Performed by: Cheri Fowler Consent: Verbal consent obtained. Risks and benefits: risks, benefits and alternatives were discussed Patient identity confirmed: provided demographic data Time out performed prior to procedure Prepped and Draped in normal sterile fashion Wound explored Laceration Location: right forearm Laceration Length: 4cm No Foreign Bodies seen or palpated Anesthesia: none Amount of cleaning: standard Skin closure: dermabond and steri strips (x5) Patient tolerance: Patient tolerated the procedure well with no immediate complications.    DIAGNOSTIC STUDIES: Oxygen Saturation is 95% on RA, adequate by my interpretation.    COORDINATION OF CARE:  4:13 PM Discussed plan to use Dermabond for laceration repair.  Patient acknowledges and agrees with plan.    Labs Review Labs Reviewed - No  data to display  Imaging Review No results found.    EKG Interpretation None      MDM   Final diagnoses:  None    Patient presents with 4 cm superficial laceration to the right forearm.  VSS, patient appears nontoxic, NAD.  On exam, no signs of infection.  Neurovascularly intact.  Cap refill <3 seconds.    Imaging not performed.  Labs not performed.  No tetanus given per pt record (last 2012).  Suspect superficial laceration.  Low suspicion for tendon or musculature involvement.  Doubt fracture. Wound is 4 cm, clean, good approximation, and under low wound tension.  Will repair with dermabond and steristrips.  Hemostasis achieved.   Pt stable for d/c.  Advised to follow up in 2 days with PCP for wound check.  Discussed return precautions and supportive care.  Patient acknowledges and agrees with the above plan.  I personally performed the services described in this documentation, which was scribed in my presence. The recorded information has been reviewed and is accurate.    Cheri Fowler, PA-C 06/24/15 1746  Cheri Fowler, PA-C 06/24/15 1805  Cheri Fowler, PA-C 06/30/15 1610  Vanetta Mulders, MD 07/03/15 2102

## 2015-06-24 NOTE — ED Notes (Signed)
Pt here for lac to right arm. sts cut on chain fence.

## 2015-07-23 ENCOUNTER — Other Ambulatory Visit (HOSPITAL_COMMUNITY): Payer: Self-pay | Admitting: Interventional Radiology

## 2015-07-23 DIAGNOSIS — I771 Stricture of artery: Secondary | ICD-10-CM

## 2015-07-23 DIAGNOSIS — I729 Aneurysm of unspecified site: Secondary | ICD-10-CM

## 2015-07-24 ENCOUNTER — Ambulatory Visit (INDEPENDENT_AMBULATORY_CARE_PROVIDER_SITE_OTHER): Payer: Medicaid Other | Admitting: Neurology

## 2015-07-24 ENCOUNTER — Encounter: Payer: Self-pay | Admitting: Neurology

## 2015-07-24 VITALS — BP 119/72 | HR 62 | Ht 68.0 in | Wt 242.0 lb

## 2015-07-24 DIAGNOSIS — I671 Cerebral aneurysm, nonruptured: Secondary | ICD-10-CM | POA: Diagnosis not present

## 2015-07-24 NOTE — Patient Instructions (Signed)
I had a long d/w patient about his remote stroke, aneurysms, risk for recurrent stroke/TIAs, aneurysm rupture, personally independently reviewed imaging studies and stroke evaluation results and answered questions.Continue aspirin 81 mg orally every day and clopidogrel 75 mg orally every day  for secondary stroke prevention but consider discontinuing aspirin after discussion with neuroradiologist Dr. Corliss Skainseveshwar due to increased bruising and maintain strict control of hypertension with blood pressure goal below 130/90, diabetes with hemoglobin A1c goal below 6.5% and lipids with LDL cholesterol goal below 100 mg/dL. I also advised the patient to eat a healthy diet with plenty of whole grains, cereals, fruits and vegetables, exercise regularly and maintain ideal body weight Followup in the future with me in one year or call earlier if necessary

## 2015-07-24 NOTE — Progress Notes (Signed)
STROKE NEUROLOGY FOLLOW UP NOTE  NAME: Mariea ClontsRichard L Tuckerman DOB: 09/22/1961  REASON FOR VISIT: stroke follow up HISTORY FROM: pt and chart  Today we had the pleasure of seeing Mariea Clontsichard L Boeckman in follow-up at our [facility]. Pt was accompanied by mom.   History Summary 54 year old gentleman with a past medical history of "leaky valve" followed by Dr. Sharyn LullHarwani, asthma was admitted on 07/03/14 for acute onset lightheadedness, left arm numbness as well as left facial droop and slurred speech. MRI showed right MCA stroke. 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 Strong family history of cerebral aneurysms with rupture. Recommended outpt follow up. His stroke was considered related to right ICA stenosis/occlusion, but he was also enrolled in [name] trial and discharged home with home PT/OT. He was also admitted on 07/07/14 for atypical chest pain and work up negative and d/c the next day. 08/08/14 he had left MCA aneurysm coiling with stenting by Dr. Corliss Skainseveshwar. 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 He was taken off [name] and put on ASA 81 and plavix 75mg  on discharge the next day. However, he was re-admitted on 08/11/14 due to transient right arm weakness and dizziness x 2 episodes. CT head showed focal small areas of hemorrhagic transformation on the right MCA infarct in the right insula which was confirmed with MRI. Right arm weakness did not appear to correlate with right-sided hemorrhagic transformation seen on MRI. There is no evidence of acute CVA on left side of brain per MRI report. Suspect his right UE weakness could have been related to TIA. Case was discussed with Dr. Corliss Skainseveshwar who initially recommended increasing aspirin to 325 mg daily along with Plavix 75 mg daily. Repeat head CT on 08/14/2014 showed unchanged appearance  of right MCA territory infarct with hemorrhage, no interval changes from previous study on 08/12/2014. Neurology recommended discharging him on 37.5 mg of Plavix with aspirin 325 mg daily. Patient was discharged in stable condition on 08/14/2014 to his home. 08/27/14 he was seen in ED for acute onset right sided headache. CT head shows no acute findings with improvement of edema and less apparent hemorrhage. He was discharged and asked to follow up with neurology as outpt ASAP.   Follow up 08/29/14 - the patient has been doing stable. He still complains of intermittent mild pain at right side but tolerable. He stated that he went to see Dr. Corliss Skainseveshwar 11/12  as follow up and discussed about further right ICA intervention and right MCA aneurysm intervention. He has next appointment with Dr. Corliss Skainseveshwar on 09/25/14.  Interval History During the interval time, patient has been doing well. He was seen by Dr. Corliss Skainseveshwar on 11/21/14 and repeated angiogram showed right ICA partial recannulized with stenosis at 77%, continued right MCA and left ACA aneurysms. He was instructed by Dr. Corliss Skainseveshwar to continue aspirin and Plavix as current dose, and will do right ICA stent in the possible right MCA aneurysm coiling in 3 months. He also had EMG/NCS with Dr. Lucia GaskinsAhern in 10/2014 showed normal study. He still complains of intermittent LLE numbness and pain. She has been followed with Dr. Pearlean BrownieSethi for [name] follow up visits. He states that his blood pressure was controlled at home as well as as his glucose. His recent A1c was 5.4 in January 2016 and his blood pressure today in  clinic 126/73. Update 01/22/15 : He returns for follow-up after last visit to 6 weeks ago with Dr. Roda Shutters. He has not yet had repeat a second aneurysm treatment by Dr. Patricia Nettle which is scheduled now for a number of May. He still has occasional left perioral numbness off and he remains on Neurontin 100 mg 3 times daily which seems to be helping. on which is transient  particularly noted when he is tired. He states his blood pressure control has been good. Last lipid profile checked in November 2015 showed LDL of 68. Last hemoglobin A1c checked last month was 6.4. He has been walking daily as well as drinking lots of water. He remains on aspirin and Plavix which is tolerating well without significant bleeding or bruising. He still has some numbness in his thighs and legs and back but it seems to be improving. EMG nerve conduction study done by Dr. Daisy Blossom in January 2016 was unremarkable without evidence of radiculopathy or neuropathy. Lower extremity arterial Dopplers were also fairly benign Update 07/24/2015 : He returns for follow-up after last visit 6 months ago. He continues to do well without recurrent stroke or TIA symptoms. He remains on aspirin and Plavix and does complain of increased bruising easily. He is not had any bleeding complications. He states his blood pressure is well controlled and today it is 119/72. His fasting sugars have all been good and last hemoglobin A1c checked 1 month ago was in fact 4.1. He recently had rotator cuff surgery in August which went well and is able to move his shoulder much better. He has an upcoming appointment with Dr. [name] on 11/10 16 to have follow-up catheter angiogram to be done. He has no new complaints today. REVIEW OF SYSTEMS: Full 14 system review of systems performed and notable only for those listed below and in HPI above, all others are negative:  Trouble swallowing, drooling, wheezing, shortness of breath, leg swelling, rectal bleeding, restless legs, snoring, back pain, neck pain, neck stiffness, itching, easy bruising, headache The following represents the patient's updated allergies and side effects list: Allergies  Allergen Reactions  . Tylox [Oxycodone-Acetaminophen] Hives, Other (See Comments) and Rash    Sweating, shaking Reaction: Tremors and diaphoresis   . Adhesive [Tape] Rash    Labs since last  visit of relevance include the following: Results for orders placed or performed during the hospital encounter of 06/09/15  I-Stat Chem 8, ED  Result Value Ref Range   Sodium 140 135 - 145 mmol/L   Potassium 3.9 3.5 - 5.1 mmol/L   Chloride 100 (L) 101 - 111 mmol/L   BUN 19 6 - 20 mg/dL   Creatinine, Ser 1.61 0.61 - 1.24 mg/dL   Glucose, Bld 096 (H) 65 - 99 mg/dL   Calcium, Ion 0.45 4.09 - 1.23 mmol/L   TCO2 27 0 - 100 mmol/L   Hemoglobin 14.6 13.0 - 17.0 g/dL   HCT 81.1 91.4 - 78.2 %    The neurologically relevant items on the patient's problem list were reviewed on today's visit.        Blood pressure 119/72, pulse 62, height  (1.727 m), weight 242 lb (109.77 kg). Physical Exam :   .obese middle aged Caucasian male not in distress. Afebrile. Head is nontraumatic. Neck is supple without bruit.    Cardiac exam no murmur or gallop. Lungs are clear to auscultation. Distal pulses are well felt.   Neurological Exam ;  Awake  Alert oriented x 3. Normal speech  and language.eye movements full without nystagmus.fundi were not visualized. Vision acuity and fields appear normal. Hearing is normal. Palatal movements are normal. Face symmetric. Tongue midline. Normal strength, tone, reflexes and coordination. Normal sensation. Gait deferred..  Data reviewed: I personally reviewed the images and agree with the radiology interpretations.  Ct Head (brain) Wo Contrast  10/14/14 - 1. No evidence of traumatic intracranial injury or fracture. 2. Chronic encephalomalacia at the right frontoparietal region, with involvement of the right basal ganglia, reflecting remote infarct. 3. Status post left-sided aneurysm clipping, with associated clips.  08/27/2014   IMPRESSION: 1. Continued evolution of hemorrhagic right MCA infarct, without new acute findings, as detailed above.      08/14/2014   IMPRESSION: Unchanged appearance of right MCA infarct with hemorrhage. No interval changes from 08/12/2014.       08/12/2014   IMPRESSION: Stable right hemorrhagic infarct involving the insular and Barre insular cortex in the right MCA distribution. No increasing mass effect. No new infarct. No increasing hemorrhage.     08/11/2014   IMPRESSION: 1. Focal small areas of hemorrhagic transformation of the right middle cerebral artery infarct in the right insula. No subarachnoid hemorrhage. 2. Marked improvement in edema at the site of the prior infarct with resolution of mass effect upon the right lateral ventricle.    [name]  08/11/2014   IMPRESSION: Hemorrhagic transformation of right MCA infarct. The infarct volume has increased in size since 07/04/2014 consistent with extension however this does not appear to be acute. The exact time of the infarct extension hemorrhage cannot be determined on this study but occurred in the interval.  No acute infarct on the left.  Interval coiling of left MCA aneurysm.      Cerebral angio  11/21/14 -  Interval partial recanalization of the right carotid bulb with continued presence of filling defects in this pouch. These probably represent dissolving clots. Improved flow into the right internal carotid artery is noted with associated approximately 77% narrowing by NASCET criteria. Approximately 4.2 mm x 3.6 mm right MCA bifurcation aneurysm stable. Completely obliterated left MCA bifurcation aneurysm with no evidence of coil compaction or recanalization. Widely patent stent across the neck of the aneurysm. Approximately 2.5 mm x 2.1 mm left ACA proximal aneurysm projecting superiorly.  08/13/2014   IMPRESSION: Status post endovascular complete obliteration of the unruptured left middle cerebral artery region aneurysm using the LVIS JR stent assisted coiling as described.  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    Dg Chest Port 1 View  08/11/2014   IMPRESSION: No significant abnormalities.      EEG 10/31 -  this is a normal EEG recording during wakefulness and during sleep with no evidence of seizure disorder demonstrated.  2D echo - - Left ventricle: The cavity size was normal. There was mild concentric hypertrophy. Systolic function was normal. The estimated ejection fraction was in the range of 55% to 60%. Images were inadequate for LV wall motion assessment. - Left atrium: The atrium was mildly to moderately dilated.  CUS 07/05/14 - Right ICA is occluded. Left: mild soft plaque origin ICA. 40-50% ICA stenosis, velocities may be elevated secondary to compensatoryflow. Bilateral vertebral artery flow is antegrade.  EMG/NCS 10/31/14 - This is a normal study. There is no electrophysiologic evidence for radiculopathy or polyneuropathy. Clinical correlation suggested.  Component     Latest Ref Rng 08/12/2014  Cholesterol     0 - 200 mg/dL 960  Triglycerides     <150 mg/dL 90  HDL     >45 mg/dL 31 (L)  Total CHOL/HDL Ratio      3.7  VLDL     0 - 40 mg/dL 18  LDL (calc)     0 - 99 mg/dL 65  Hgb W0J MFr Bld     <5.7 % 5.9 (H)  Mean Plasma Glucose     <117 mg/dL 811 (H)    Assessment: As you may recall, he is a 54 y.o. Caucasian male with PMH of "leaky valve" and asthma was admitted to [facility] in 06/2013 due to right MCA stroke. Found to have right ICA occlusion as well as left MCA aneurysm. He had election left MCA aneurysm coiling and stenting. He was put on ASA and plavix. In 07/2014 he had a right arm weakness and CT/MRI showed small hemorrhagic transformation at the right stroke site, not able to explain his right sided weakness. Due to potential TIA episodes, he was changed to ASA 325 and 75 mg plavix. Later that was changed to ASA 325mg  with 37.5mg  plavix by neurology. He saw Dr. [name] on 11/21/14 and 03/01/15 and angiogram showed  right ICA partial recannalization with 77% stenosis, continued right MCA and left ACA aneurysms.  Plan:  -I had a long d/w patient about his remote stroke, aneurysms, risk for recurrent stroke/TIAs, aneurysm rupture, personally independently reviewed imaging studies and stroke evaluation results and answered questions.Continue aspirin 81 mg orally every day and clopidogrel 75 mg orally every day  for secondary stroke prevention but consider discontinuing aspirin after discussion with neuroradiologist Dr. [name]  At upcoming visit next month due to increased bruising and maintain strict control of hypertension with blood pressure goal below 130/90, diabetes with hemoglobin A1c goal below 6.5% and lipids with LDL cholesterol goal below 100 mg/dL. I also advised the patient to eat a healthy diet with plenty of whole grains, cereals, fruits and vegetables, exercise regularly and maintain ideal body weight Followup in the future with me in one year or call earlier if necessary  Delia Heady, MD No orders of the defined types were placed in this encounter.          [name]        [name]

## 2015-08-08 NOTE — Telephone Encounter (Signed)
Error

## 2015-08-21 ENCOUNTER — Other Ambulatory Visit: Payer: Self-pay | Admitting: Radiology

## 2015-08-22 ENCOUNTER — Encounter (HOSPITAL_COMMUNITY): Payer: Self-pay

## 2015-08-22 ENCOUNTER — Ambulatory Visit (HOSPITAL_COMMUNITY)
Admission: RE | Admit: 2015-08-22 | Discharge: 2015-08-22 | Disposition: A | Discharge status: Home / Self Care | Payer: Medicaid Other | Source: Ambulatory Visit | Attending: Interventional Radiology | Admitting: Interventional Radiology

## 2015-08-22 ENCOUNTER — Other Ambulatory Visit (HOSPITAL_COMMUNITY): Payer: Self-pay | Admitting: Interventional Radiology

## 2015-08-22 DIAGNOSIS — Z8249 Family history of ischemic heart disease and other diseases of the circulatory system: Secondary | ICD-10-CM | POA: Diagnosis not present

## 2015-08-22 DIAGNOSIS — Z7982 Long term (current) use of aspirin: Secondary | ICD-10-CM | POA: Diagnosis not present

## 2015-08-22 DIAGNOSIS — K219 Gastro-esophageal reflux disease without esophagitis: Secondary | ICD-10-CM | POA: Diagnosis not present

## 2015-08-22 DIAGNOSIS — F319 Bipolar disorder, unspecified: Secondary | ICD-10-CM | POA: Diagnosis not present

## 2015-08-22 DIAGNOSIS — J449 Chronic obstructive pulmonary disease, unspecified: Secondary | ICD-10-CM | POA: Insufficient documentation

## 2015-08-22 DIAGNOSIS — Z8673 Personal history of transient ischemic attack (TIA), and cerebral infarction without residual deficits: Secondary | ICD-10-CM | POA: Insufficient documentation

## 2015-08-22 DIAGNOSIS — I129 Hypertensive chronic kidney disease with stage 1 through stage 4 chronic kidney disease, or unspecified chronic kidney disease: Secondary | ICD-10-CM | POA: Insufficient documentation

## 2015-08-22 DIAGNOSIS — Z87891 Personal history of nicotine dependence: Secondary | ICD-10-CM | POA: Diagnosis not present

## 2015-08-22 DIAGNOSIS — I6521 Occlusion and stenosis of right carotid artery: Secondary | ICD-10-CM | POA: Insufficient documentation

## 2015-08-22 DIAGNOSIS — I771 Stricture of artery: Secondary | ICD-10-CM

## 2015-08-22 DIAGNOSIS — I671 Cerebral aneurysm, nonruptured: Secondary | ICD-10-CM | POA: Diagnosis not present

## 2015-08-22 DIAGNOSIS — N189 Chronic kidney disease, unspecified: Secondary | ICD-10-CM | POA: Insufficient documentation

## 2015-08-22 DIAGNOSIS — Z7984 Long term (current) use of oral hypoglycemic drugs: Secondary | ICD-10-CM | POA: Insufficient documentation

## 2015-08-22 DIAGNOSIS — E785 Hyperlipidemia, unspecified: Secondary | ICD-10-CM | POA: Insufficient documentation

## 2015-08-22 DIAGNOSIS — Z7902 Long term (current) use of antithrombotics/antiplatelets: Secondary | ICD-10-CM | POA: Diagnosis not present

## 2015-08-22 DIAGNOSIS — E1122 Type 2 diabetes mellitus with diabetic chronic kidney disease: Secondary | ICD-10-CM | POA: Insufficient documentation

## 2015-08-22 DIAGNOSIS — R51 Headache: Secondary | ICD-10-CM | POA: Diagnosis present

## 2015-08-22 DIAGNOSIS — I729 Aneurysm of unspecified site: Secondary | ICD-10-CM

## 2015-08-22 DIAGNOSIS — J45909 Unspecified asthma, uncomplicated: Secondary | ICD-10-CM | POA: Insufficient documentation

## 2015-08-22 LAB — CBC WITH DIFFERENTIAL/PLATELET
Basophils Absolute: 0 10*3/uL (ref 0.0–0.1)
Basophils Relative: 0 %
EOS ABS: 0.2 10*3/uL (ref 0.0–0.7)
EOS PCT: 3 %
HCT: 39.7 % (ref 39.0–52.0)
Hemoglobin: 13.3 g/dL (ref 13.0–17.0)
LYMPHS ABS: 2.3 10*3/uL (ref 0.7–4.0)
LYMPHS PCT: 34 %
MCH: 29.3 pg (ref 26.0–34.0)
MCHC: 33.5 g/dL (ref 30.0–36.0)
MCV: 87.4 fL (ref 78.0–100.0)
MONO ABS: 0.6 10*3/uL (ref 0.1–1.0)
Monocytes Relative: 9 %
Neutro Abs: 3.6 10*3/uL (ref 1.7–7.7)
Neutrophils Relative %: 54 %
PLATELETS: 223 10*3/uL (ref 150–400)
RBC: 4.54 MIL/uL (ref 4.22–5.81)
RDW: 12.6 % (ref 11.5–15.5)
WBC: 6.6 10*3/uL (ref 4.0–10.5)

## 2015-08-22 LAB — APTT: aPTT: 29 seconds (ref 24–37)

## 2015-08-22 LAB — BASIC METABOLIC PANEL
Anion gap: 6 (ref 5–15)
BUN: 13 mg/dL (ref 6–20)
CO2: 26 mmol/L (ref 22–32)
CREATININE: 0.76 mg/dL (ref 0.61–1.24)
Calcium: 8.9 mg/dL (ref 8.9–10.3)
Chloride: 102 mmol/L (ref 101–111)
GFR calc Af Amer: >60 mL/min (ref >60)
GFR calc non Af Amer: >60 mL/min (ref >60)
GLUCOSE: 105 mg/dL — AB (ref 65–99)
POTASSIUM: 3.8 mmol/L (ref 3.5–5.1)
SODIUM: 134 mmol/L — AB (ref 135–145)

## 2015-08-22 LAB — GLUCOSE, CAPILLARY: Glucose-Capillary: 93 mg/dL (ref 65–99)

## 2015-08-22 LAB — PROTIME-INR
INR: 1.03 (ref 0.00–1.49)
PROTHROMBIN TIME: 13.7 s (ref 11.6–15.2)

## 2015-08-22 MED ORDER — MIDAZOLAM HCL 2 MG/2ML IJ SOLN
INTRAMUSCULAR | Status: AC
  Filled 2015-08-22: qty 4

## 2015-08-22 MED ORDER — LIDOCAINE HCL 1 % IJ SOLN
INTRAMUSCULAR | Status: AC
  Filled 2015-08-22: qty 20

## 2015-08-22 MED ORDER — SODIUM CHLORIDE 0.9 % IV SOLN: INTRAVENOUS | Status: AC

## 2015-08-22 MED ORDER — HEPARIN SOD (PORK) LOCK FLUSH 100 UNIT/ML IV SOLN
INTRAVENOUS | Status: AC
  Filled 2015-08-22: qty 20

## 2015-08-22 MED ORDER — NALOXONE HCL 0.4 MG/ML IJ SOLN
INTRAMUSCULAR | Status: AC
  Filled 2015-08-22: qty 1

## 2015-08-22 MED ORDER — IOHEXOL 300 MG/ML  SOLN
250.0000 mL | Freq: Once | INTRAMUSCULAR | Status: DC | PRN
  Administered 2015-08-22: 125 mL via INTRAVENOUS
  Filled 2015-08-22: qty 250

## 2015-08-22 MED ORDER — FLUMAZENIL 0.5 MG/5ML IV SOLN
INTRAVENOUS | Status: AC
  Filled 2015-08-22: qty 5

## 2015-08-22 MED ORDER — FENTANYL CITRATE (PF) 100 MCG/2ML IJ SOLN
INTRAMUSCULAR | Status: AC | PRN
  Administered 2015-08-22: 25 ug via INTRAVENOUS

## 2015-08-22 MED ORDER — HEPARIN SODIUM (PORCINE) 1000 UNIT/ML IJ SOLN
INTRAMUSCULAR | Status: AC
  Filled 2015-08-22: qty 1

## 2015-08-22 MED ORDER — SODIUM CHLORIDE 0.9 % IV SOLN
INTRAVENOUS | Status: DC
  Administered 2015-08-22: 08:00:00 via INTRAVENOUS

## 2015-08-22 MED ORDER — HEPARIN SOD (PORK) LOCK FLUSH 100 UNIT/ML IV SOLN
INTRAVENOUS | Status: AC | PRN
  Administered 2015-08-22 (×2): 500 [IU] via INTRAVENOUS

## 2015-08-22 MED ORDER — LIDOCAINE HCL (PF) 2 % IJ SOLN
INTRAMUSCULAR | Status: AC
  Filled 2015-08-22: qty 10

## 2015-08-22 MED ORDER — FENTANYL CITRATE (PF) 100 MCG/2ML IJ SOLN
INTRAMUSCULAR | Status: AC
  Filled 2015-08-22: qty 4

## 2015-08-22 MED ORDER — MIDAZOLAM HCL 2 MG/2ML IJ SOLN
INTRAMUSCULAR | Status: AC | PRN
  Administered 2015-08-22: 1 mg via INTRAVENOUS

## 2015-08-22 NOTE — Sedation Documentation (Addendum)
Patient is resting comfortably. 

## 2015-08-22 NOTE — H&P (Signed)
History of Present Illness: [name] is a 54 y.o. male with history of CVA, right internal carotid artery stenosis on ASA  every other day and Plavix 37.5mg  every other day. He has history of treated Left MCA intracranial aneurysm with findings of multiple intracranial aneurysms, last catheter angiogram was on 02/2015. He has remained on his above medications and is scheduled today for cerebral arteriogram. He denies any new neurological changes, he still complains of frontal headaches that have not changed in duration, intensity or frequency. He denies any vision or speech changes. He denies any extremity weakness. He denies any chest pain, change in chronic shortness of breath or palpitations. He denies any active signs of bleeding or excessive bruising. He denies any recent fever or chills. The patient denies any history of sleep apnea or chronic oxygen use. He has previously tolerated iodinated contrast and sedation without complications.    Past Medical History  Diagnosis Date  . Stroke (HCC)     2015  . H/O hiatal hernia   . Asthma     Last asthma attack at age 54; History of trach at 16 months  . Hypertension     takes Ramipril daily  . Hyperlipidemia     takes Ramipril daily  . Diabetes mellitus without complication (HCC)     takes Metformin daily  . COPD (chronic obstructive pulmonary disease) (HCC) 06/2012    uses Spiriva and Albuterol daily as needed  . Pneumonia     hx of-2014  . History of bronchitis 2014  . Headache(784.0)     "all the time"  . Aneurysm (HCC)     brain  . Umbilical hernia   . Numbness     both arms   . Burning pain     in both legs-seeing Dr.Sethi for this  . Arthritis   . Joint pain   . Joint swelling   . Back pain     reason unknown   . GERD (gastroesophageal reflux disease)     was on Nexium;taking Omeprazole daily as needed  . Nocturia   . Chronic kidney disease 2009    chronic kidney disease  . Bipolar disorder (HCC)     was  on meds but was taken off 2 yrs ago and none since  . Complication of anesthesia   . PONV (postoperative nausea and vomiting)     Pt reports nausea only.    Past Surgical History  Procedure Laterality Date  . Tracheostomy closure    . Tracheostomy      at age 42 months old  . Rectal surgery    . Rotator cuff repair Left 1996  . Radiology with anesthesia N/A 08/08/2014    Procedure: RADIOLOGY WITH ANESTHESIA EMBOLIZATION;  Surgeon: Medication Radiologist, MD;  Location: [facility] OR;  Service: Radiology;  Laterality: N/A;  . Knee arthroscopy Right   . Cyst removed from right wrist    . Angiogram with coiling    . Colonoscopy    . Radiology with anesthesia N/A 11/21/2014    Procedure: RADIOLOGY WITH ANESTHESIA;  Surgeon: Oneal Grout, MD;  Location: [facility] OR;  Service: Radiology;  Laterality: N/A;  . Radiology with anesthesia N/A 02/27/2015    Procedure: RADIOLOGY WITH ANESTHESIA;  Surgeon: [name], MD;  Location: [facility] OR;  Service: Radiology;  Laterality: N/A;    Allergies: Tylox and Adhesive  Medications: Prior to Admission medications   Medication Sig Start Date End Date Taking? Authorizing Provider  albuterol (  PROVENTIL HFA;VENTOLIN HFA) 108 (90 BASE) MCG/ACT inhaler Inhale 2 puffs into the lungs every 6 (six) hours as needed for wheezing or shortness of breath.    Yes Historical Provider, MD  aspirin EC 81 MG tablet Take 81 mg by mouth every other day.   Yes Historical Provider, MD  atorvastatin (LIPITOR) 20 MG tablet Take 1 tablet (20 mg total) by mouth daily at 6 PM. 07/06/14  Yes Calvert CantorSaima Rizwan, MD  clopidogrel (PLAVIX) 75 MG tablet Take 0.5 tablets (37.5 mg total) by mouth daily. Patient taking differently: Take 37.5 mg by mouth every other day.  08/14/14  Yes Jeralyn BennettEzequiel Zamora, MD  esomeprazole (NEXIUM) 40 MG capsule Take 40 mg by mouth at bedtime as needed (for acid reflux).    Yes Historical Provider, MD  hydrochlorothiazide (HYDRODIURIL) 25 MG tablet Take 25 mg by mouth  daily.  07/12/15  Yes Historical Provider, MD  loratadine (CLARITIN) 10 MG tablet Take 10 mg by mouth daily as needed for allergies.  12/31/14 12/31/15 Yes Historical Provider, MD  metFORMIN (GLUCOPHAGE) 500 MG tablet Take 1 tablet (500 mg total) by mouth 2 (two) times daily with a meal. 07/06/14  Yes Calvert CantorSaima Rizwan, MD  nitroGLYCERIN (NITROSTAT) 0.4 MG SL tablet Place 1 tablet (0.4 mg total) under the tongue every 5 (five) minutes x 3 doses as needed for chest pain. 07/08/14  Yes Orpah CobbAjay Kadakia, MD  ramipril (ALTACE) 2.5 MG capsule Take 2.5 mg by mouth daily.   Yes Historical Provider, MD  tiotropium (SPIRIVA HANDIHALER) 18 MCG inhalation capsule Place 1 capsule (18 mcg total) into inhaler and inhale every morning. Patient taking differently: Place 18 mcg into inhaler and inhale as needed (for shortness of breath).  08/14/14  Yes Jeralyn BennettEzequiel Zamora, MD  triamcinolone cream (KENALOG) 0.1 % Apply 1 application topically 2 (two) times daily.   Yes Historical Provider, MD  gabapentin (NEURONTIN) 100 MG capsule TAKE 1 CAPSULE BY MOUTH THREE TIMES DAILY Patient not taking: Reported on 08/20/2015 01/09/15   Marvel PlanJindong Xu, MD     Family History  Problem Relation Age of Onset  . Heart disease Mother     s/p 3V CABG  . Cancer Father 6072    lung cancer; +tobacco  . Cancer Maternal Grandmother   . Heart disease Maternal Grandmother   . Cancer Paternal Grandmother   . Lupus Sister   . Multiple sclerosis Sister   . Anemia Daughter   . Stroke Father     Social History   Social History  . Marital Status: Married    Spouse Name: not together since 2007  . Number of Children: 3  . Years of Education: 10   Occupational History  . Engineer, productionield Technician     [employer]    Social History Main Topics  . Smoking status: Former Smoker -- 1.00 packs/day    Types: Cigarettes    Start date: 01/15/1975    Quit date: 05/12/2014  . Smokeless tobacco: Never Used     Comment: quit smoking in Aug 2015  . Alcohol Use: 0.0 oz/week      0 Standard drinks or equivalent per week     Comment: only on Wed  . Drug Use: No  . Sexual Activity:    Partners: Female     Comment: partner has had surgery to prevent pregnancy   Other Topics Concern  . None   Social History Narrative   Lives with his son and his mother.  His son has no contact with the  son's mother, though the patient is not legally separated from her.  She has a history of drug use and has been in prison several times.    Review of Systems: A 12 point ROS discussed and pertinent positives are indicated in the HPI above.  All other systems are negative.  Review of Systems  Vital Signs: BP 129/52 mmHg  Pulse 61  Temp(Src) 97.8 F (36.6 C)  Resp 18  Ht  (1.727 m)  SpO2 98%  Physical Exam  Constitutional: He is oriented to person, place, and time. No distress.  HENT:  Head: Normocephalic and atraumatic.  Cardiovascular: Normal rate, regular rhythm and intact distal pulses.  Exam reveals no gallop and no friction rub.   No murmur heard. Pulmonary/Chest: Effort normal. No respiratory distress. He has wheezes. He has no rales.  Abdominal: Soft. Bowel sounds are normal. He exhibits no distension. There is no tenderness.  Neurological: He is alert and oriented to person, place, and time.  Speech clear, smile symmetrical, tongue midline, equal strength upper and lower extremities bilaterally 5/5, intact LE dorsiflexion and plantarflexion, EOMI  Skin: Skin is warm and dry. He is not diaphoretic.    Mallampati Score:  MD Evaluation Airway: WNL Heart: WNL Abdomen: WNL Chest/ Lungs: WNL ASA  Classification: 3 Mallampati/Airway Score: Two  Imaging: No results found.  Labs:  CBC:  Recent Labs  11/05/14 0842 11/13/14 0703 11/21/14 0750 02/25/15 0843 06/09/15 1850  WBC 6.6 6.0 5.5 5.5  --   HGB 14.6 12.5* 12.7* 13.9 14.6  HCT 42.6 36.6* 37.4* 41.1 43.0  PLT 246 189 184 208  --     COAGS:  Recent Labs  11/05/14 0842 11/13/14 0703  11/21/14 0750 02/25/15 0843  INR 1.14 1.00 1.03 0.90  APTT BMP:  Recent Labs  11/05/14 0842 11/13/14 0703 11/21/14 0750 02/25/15 0843 06/09/15 1850  NA 138 139 139 139 140  K 4.1 4.1 4.0 4.2 3.9  CL 104 107 105 104 100*  CO2 --   GLUCOSE 102* 111* 106* 92 104*  BUN CALCIUM 9.4 8.7 9.0 9.2  --   CREATININE 0.71 0.74 0.75 0.78 0.90  GFRNONAA >90 >90 >90 >60  --   GFRAA >90 >90 >90 >60  --     LIVER FUNCTION TESTS:  Recent Labs  11/05/14 0842 11/13/14 0703 11/21/14 0750 02/25/15 0843  BILITOT 0.5 0.4 0.6 0.4  AST ALT ALKPHOS 72 57 63 61  PROT 7.4 6.1 6.2 7.1  ALBUMIN 4.1 3.4* 3.7 4.2    Assessment and Plan: History of right cerebral ischemic CVA Right internal carotid artery stenosis on ASA  every other day and Plavix 37.5mg  every other day History of treated Left MCA intracranial aneurysm  Multiple intracranial aneurysms, last catheter angiogram 02/2015 Scheduled today for cerebral arteriogram with sedation- no new neurological changes Risks and Benefits discussed with the patient including, but not limited to bleeding, infection, vascular injury, contrast induced renal failure or stroke. All of the patient's questions were answered, patient is agreeable to proceed. Consent signed and in chart. COPD DM   Signed: Berneta Levins 08/22/2015, 8:04 AM

## 2015-08-22 NOTE — Sedation Documentation (Signed)
Patient is resting comfortably. 

## 2015-08-22 NOTE — Sedation Documentation (Signed)
Patient denies pain and is resting comfortably.  

## 2015-08-22 NOTE — Procedures (Signed)
S/P 4 vessel cerebral aretriogram. Rt CFA approach. Findings. 1.Approx 80 to 90 % RT ICA stenosis . 2.Approx 3.3 x 4.4 RT MCA bifurcation aneurysm. 3.Approx 2.3 mm x 1.6 mm Lt ICA terminus aneurysm

## 2015-08-22 NOTE — Discharge Instructions (Signed)
Angiogram, Care After °Refer to this sheet in the next few weeks. These instructions provide you with information about caring for yourself after your procedure. Your health care provider may also give you more specific instructions. Your treatment has been planned according to current medical practices, but problems sometimes occur. Call your health care provider if you have any problems or questions after your procedure. °WHAT TO EXPECT AFTER THE PROCEDURE °After your procedure, it is typical to have the following: °· Bruising at the catheter insertion site that usually fades within 1-2 weeks. °· Blood collecting in the tissue (hematoma) that may be painful to the touch. It should usually decrease in size and tenderness within 1-2 weeks. °HOME CARE INSTRUCTIONS °· Take medicines only as directed by your health care provider. °· You may shower 24-48 hours after the procedure or as directed by your health care provider. Remove the bandage (dressing) and gently wash the site with plain soap and water. Pat the area dry with a clean towel. Do not rub the site, because this may cause bleeding. °· Do not take baths, swim, or use a hot tub until your health care provider approves. °· Check your insertion site every day for redness, swelling, or drainage. °· Do not apply powder or lotion to the site. °· Do not lift over 10 lb (4.5 kg) for 5 days after your procedure or as directed by your health care provider. °· Ask your health care provider when it is okay to: °¨ Return to work or school. °¨ Resume usual physical activities or sports. °¨ Resume sexual activity. °· Do not drive home if you are discharged the same day as the procedure. Have someone else drive you. °· You may drive 24 hours after the procedure unless otherwise instructed by your health care provider. °· Do not operate machinery or power tools for 24 hours after the procedure or as directed by your health care provider. °· If your procedure was done as an  outpatient procedure, which means that you went home the same day as your procedure, a responsible adult should be with you for the first 24 hours after you arrive home. °· Keep all follow-up visits as directed by your health care provider. This is important. °SEEK MEDICAL CARE IF: °· You have a fever. °· You have chills. °· You have increased bleeding from the catheter insertion site. Hold pressure on the site and call 911. °SEEK IMMEDIATE MEDICAL CARE IF: °· You have unusual pain at the catheter insertion site. °· You have redness, warmth, or swelling at the catheter insertion site. °· You have drainage (other than a small amount of blood on the dressing) from the catheter insertion site. °· The catheter insertion site is bleeding, and the bleeding does not stop after 30 minutes of holding steady pressure on the site. °· The area near or just beyond the catheter insertion site becomes pale, cool, tingly, or numb. °  °This information is not intended to replace advice given to you by your health care provider. Make sure you discuss any questions you have with your health care provider. °  °Document Released: 04/16/2005 Document Revised: 10/19/2014 Document Reviewed: 03/01/2013 °Elsevier Interactive Patient Education ©2016 Elsevier Inc. ° °

## 2015-08-22 NOTE — Sedation Documentation (Signed)
[facility] via stretcher by Avery DennisonHeather IR tech and Textron IncLeslie RN. Right groin site and pulses checked and verified by Herbert SetaHeather IR tech, Archie Pattenonya RN and [employer] IncLeslie RN. Dressing clean, dry, and intact, area soft to touch, no hematoma noted.

## 2015-08-25 ENCOUNTER — Other Ambulatory Visit: Payer: Self-pay | Admitting: Neurology

## 2015-08-28 ENCOUNTER — Other Ambulatory Visit (HOSPITAL_COMMUNITY): Payer: Self-pay | Admitting: Interventional Radiology

## 2015-08-28 DIAGNOSIS — I771 Stricture of artery: Secondary | ICD-10-CM

## 2015-09-16 ENCOUNTER — Other Ambulatory Visit: Payer: Self-pay | Admitting: Radiology

## 2015-09-17 ENCOUNTER — Other Ambulatory Visit: Payer: Self-pay | Admitting: Radiology

## 2015-09-17 LAB — HEPATIC FUNCTION PANEL
ALBUMIN: 3.9 g/dL (ref 3.5–5.0)
ALK PHOS: 60 U/L (ref 38–126)
ALT: 21 U/L (ref 17–63)
AST: 21 U/L (ref 15–41)
BILIRUBIN INDIRECT: 0.6 mg/dL (ref 0.3–0.9)
Bilirubin, Direct: 0.1 mg/dL (ref 0.1–0.5)
TOTAL PROTEIN: 6.9 g/dL (ref 6.5–8.1)
Total Bilirubin: 0.7 mg/dL (ref 0.3–1.2)

## 2015-09-17 LAB — BASIC METABOLIC PANEL
Anion gap: 8 (ref 5–15)
BUN: 10 mg/dL (ref 6–20)
CHLORIDE: 100 mmol/L — AB (ref 101–111)
CO2: 29 mmol/L (ref 22–32)
CREATININE: 0.79 mg/dL (ref 0.61–1.24)
Calcium: 9.4 mg/dL (ref 8.9–10.3)
GFR calc Af Amer: >60 mL/min (ref >60)
GFR calc non Af Amer: >60 mL/min (ref >60)
GLUCOSE: 123 mg/dL — AB (ref 65–99)
POTASSIUM: 3.7 mmol/L (ref 3.5–5.1)
Sodium: 137 mmol/L (ref 135–145)

## 2015-09-17 LAB — LIPID PANEL
CHOL/HDL RATIO: 3.4 ratio
Cholesterol: 140 mg/dL (ref 0–200)
HDL: 41 mg/dL (ref >40)
LDL Cholesterol: 84 mg/dL (ref 0–99)
Triglycerides: 73 mg/dL (ref <150)
VLDL: 15 mg/dL (ref 0–40)

## 2015-09-17 LAB — PLATELET INHIBITION P2Y12: Platelet Function  P2Y12: 87 [PRU] — ABNORMAL LOW (ref 194–418)

## 2015-09-18 LAB — HEMOGLOBIN A1C
Hgb A1c MFr Bld: 5.8 % — ABNORMAL HIGH (ref 4.8–5.6)
Mean Plasma Glucose: 120 mg/dL

## 2015-09-19 ENCOUNTER — Encounter (HOSPITAL_COMMUNITY): Payer: Self-pay

## 2015-09-19 ENCOUNTER — Other Ambulatory Visit: Payer: Self-pay

## 2015-09-19 ENCOUNTER — Encounter (HOSPITAL_COMMUNITY)
Admission: RE | Admit: 2015-09-19 | Discharge: 2015-09-19 | Disposition: A | Discharge status: Home / Self Care | Payer: Medicaid Other | Source: Ambulatory Visit | Attending: Rheumatology | Admitting: Rheumatology

## 2015-09-19 DIAGNOSIS — E1122 Type 2 diabetes mellitus with diabetic chronic kidney disease: Secondary | ICD-10-CM | POA: Diagnosis not present

## 2015-09-19 DIAGNOSIS — Z79899 Other long term (current) drug therapy: Secondary | ICD-10-CM | POA: Diagnosis not present

## 2015-09-19 DIAGNOSIS — K219 Gastro-esophageal reflux disease without esophagitis: Secondary | ICD-10-CM | POA: Diagnosis not present

## 2015-09-19 DIAGNOSIS — I129 Hypertensive chronic kidney disease with stage 1 through stage 4 chronic kidney disease, or unspecified chronic kidney disease: Secondary | ICD-10-CM | POA: Diagnosis not present

## 2015-09-19 DIAGNOSIS — I4510 Unspecified right bundle-branch block: Secondary | ICD-10-CM | POA: Insufficient documentation

## 2015-09-19 DIAGNOSIS — R001 Bradycardia, unspecified: Secondary | ICD-10-CM | POA: Insufficient documentation

## 2015-09-19 DIAGNOSIS — Z7902 Long term (current) use of antithrombotics/antiplatelets: Secondary | ICD-10-CM | POA: Insufficient documentation

## 2015-09-19 DIAGNOSIS — J449 Chronic obstructive pulmonary disease, unspecified: Secondary | ICD-10-CM | POA: Diagnosis not present

## 2015-09-19 DIAGNOSIS — Z01812 Encounter for preprocedural laboratory examination: Secondary | ICD-10-CM | POA: Insufficient documentation

## 2015-09-19 DIAGNOSIS — Z87891 Personal history of nicotine dependence: Secondary | ICD-10-CM | POA: Diagnosis not present

## 2015-09-19 DIAGNOSIS — J45909 Unspecified asthma, uncomplicated: Secondary | ICD-10-CM | POA: Diagnosis not present

## 2015-09-19 DIAGNOSIS — Z7982 Long term (current) use of aspirin: Secondary | ICD-10-CM | POA: Insufficient documentation

## 2015-09-19 DIAGNOSIS — Z794 Long term (current) use of insulin: Secondary | ICD-10-CM | POA: Insufficient documentation

## 2015-09-19 DIAGNOSIS — Z01818 Encounter for other preprocedural examination: Secondary | ICD-10-CM | POA: Insufficient documentation

## 2015-09-19 DIAGNOSIS — E785 Hyperlipidemia, unspecified: Secondary | ICD-10-CM | POA: Insufficient documentation

## 2015-09-19 DIAGNOSIS — Z8673 Personal history of transient ischemic attack (TIA), and cerebral infarction without residual deficits: Secondary | ICD-10-CM | POA: Diagnosis not present

## 2015-09-19 DIAGNOSIS — N189 Chronic kidney disease, unspecified: Secondary | ICD-10-CM | POA: Insufficient documentation

## 2015-09-19 HISTORY — DX: Frequency of micturition: R35.0

## 2015-09-19 HISTORY — DX: Unspecified cataract: H26.9

## 2015-09-19 LAB — PROTIME-INR
INR: 1.11 (ref 0.00–1.49)
Prothrombin Time: 14.5 seconds (ref 11.6–15.2)

## 2015-09-19 LAB — CBC WITH DIFFERENTIAL/PLATELET
BASOS ABS: 0 10*3/uL (ref 0.0–0.1)
Basophils Relative: 0 %
EOS ABS: 0.2 10*3/uL (ref 0.0–0.7)
EOS PCT: 2 %
HCT: 40.2 % (ref 39.0–52.0)
Hemoglobin: 13.5 g/dL (ref 13.0–17.0)
LYMPHS PCT: 31 %
Lymphs Abs: 2.1 10*3/uL (ref 0.7–4.0)
MCH: 29.4 pg (ref 26.0–34.0)
MCHC: 33.6 g/dL (ref 30.0–36.0)
MCV: 87.6 fL (ref 78.0–100.0)
MONO ABS: 0.5 10*3/uL (ref 0.1–1.0)
Monocytes Relative: 8 %
Neutro Abs: 4 10*3/uL (ref 1.7–7.7)
Neutrophils Relative %: 59 %
PLATELETS: 230 10*3/uL (ref 150–400)
RBC: 4.59 MIL/uL (ref 4.22–5.81)
RDW: 12.7 % (ref 11.5–15.5)
WBC: 6.8 10*3/uL (ref 4.0–10.5)

## 2015-09-19 LAB — COMPREHENSIVE METABOLIC PANEL
ALT: 19 U/L (ref 17–63)
AST: 18 U/L (ref 15–41)
Albumin: 3.8 g/dL (ref 3.5–5.0)
Alkaline Phosphatase: 56 U/L (ref 38–126)
Anion gap: 8 (ref 5–15)
BUN: 10 mg/dL (ref 6–20)
CHLORIDE: 103 mmol/L (ref 101–111)
CO2: 26 mmol/L (ref 22–32)
CREATININE: 0.74 mg/dL (ref 0.61–1.24)
Calcium: 8.9 mg/dL (ref 8.9–10.3)
GFR calc Af Amer: >60 mL/min (ref >60)
GLUCOSE: 108 mg/dL — AB (ref 65–99)
POTASSIUM: 3.9 mmol/L (ref 3.5–5.1)
SODIUM: 137 mmol/L (ref 135–145)
Total Bilirubin: 0.7 mg/dL (ref 0.3–1.2)
Total Protein: 6.6 g/dL (ref 6.5–8.1)

## 2015-09-19 LAB — GLUCOSE, CAPILLARY: GLUCOSE-CAPILLARY: 112 mg/dL — AB (ref 65–99)

## 2015-09-19 LAB — PLATELET INHIBITION P2Y12: PLATELET FUNCTION P2Y12: 114 [PRU] — AB (ref 194–418)

## 2015-09-19 LAB — APTT: APTT: 29 s (ref 24–37)

## 2015-09-19 NOTE — Pre-Procedure Instructions (Addendum)
[name]  09/19/2015      [facility] [address] [name], [place] - [address] AT [facility] OF [facility] [address] [place] [address] Phone: [phone] Fax: [phone]    Your procedure is scheduled on Wednesday, December 14th, 2016.  Report to [facility] Admitting at 6:30 A.M.  Call this number if you have problems the morning of surgery:  [phone]   Remember:  Do not eat food or drink liquids after midnight.   Take these medicines the morning of surgery with A SIP OF WATER: Aspirin, Albuterol inhaler if needed, Gabapentin (Neurontin), Loratadine (Claritin), Spiriva if needed.  Stop taking: Aleve, Naproxen, Ibuprofen, BC's, Goody's, Motrin, Advil, all herbal medications, and all vitamins.    What do I do about my diabetes medications?   Do not take oral diabetes medicines (pills) the morning of surgery.  (NO METFORMIN)     Do not wear jewelry.  Do not wear lotions, powders, or colognes.  You may wear deodorant.  Men may shave face and neck.  Do not bring valuables to the hospital.  [facility] is not responsible for any belongings or valuables.  Contacts, dentures or bridgework may not be worn into surgery.  Leave your suitcase in the car.  After surgery it may be brought to your room.  For patients admitted to the hospital, discharge time will be determined by your treatment team.  Patients discharged the day of surgery will not be allowed to drive home.   Special instructions:  See attached.   Please read over the following fact sheets that you were given. Pain Booklet, Coughing and Deep Breathing, MRSA Information and Surgical Site Infection Prevention    How to Manage Your Diabetes Before Surgery   Why is it important to control my blood sugar before and after surgery?   Improving blood sugar levels before and after surgery helps healing and can limit problems.  A way of improving blood  sugar control is eating a healthy diet by:  - Eating less sugar and carbohydrates  - Increasing activity/exercise  - Talk with your doctor about reaching your blood sugar goals  High blood sugars (greater than 180 mg/dL) can raise your risk of infections and slow down your recovery so you will need to focus on controlling your diabetes during the weeks before surgery.  Make sure that the doctor who takes care of your diabetes knows about your planned surgery including the date and location.  How do I manage my blood sugars before surgery?   Check your blood sugar at least 4 times a day, 2 days before surgery to make sure that they are not too high or low.   Check your blood sugar the morning of your surgery when you wake up and every 2 hours until you get to the Short-Stay unit.  If your blood sugar is less than 70 mg/dL, you will need to treat for low blood sugar by:  Treat a low blood sugar (less than 70 mg/dL) with 1/2 cup of clear juice (cranberry or apple), 4 glucose tablets, OR glucose gel.  Recheck blood sugar in 15 minutes after treatment (to make sure it is greater than 70 mg/dL).  If blood sugar is not greater than 70 mg/dL on re-check, call [phone] for further instructions.   Report your blood sugar to the Short-Stay nurse when you get to Short-Stay.  References:  [facility],  2007 "How to Manage your Diabetes Before and After Surgery".

## 2015-09-19 NOTE — Progress Notes (Signed)
PCP - Dr. Tracey Harriesavid Bouska Cardiologist - Dr. Sharyn LullHarwani  EKG- 09/19/15 CXR - 09/2014  Echo - 2015 Stress test - pt. Believes 2013 or 2014 Cardiac Cath - denies  Patient denies chest pain and shortness of breath at PAT appointment.  Patient instructed to continue Aspirin and Plavix regimen.  Patient verbalizes understanding.

## 2015-09-19 NOTE — Progress Notes (Addendum)
Anesthesia Chart Review:  Pt is 54 year old male scheduled for cerebral angiogram with R ICA intervention on 09/25/2015 with Dr. Corliss Skainseveshwar.   Cardiologist is Dr. Sharyn LullHarwani, last office visit 09/02/2015. Dr. Sharyn LullHarwani is aware of upcoming procedure.   PMH includes: stroke (06/2014), HTN, DM, COPD, asthma, hyperlipidemia, aneurysm, CKD, bipolar disorder, GERD. BMI 37. Former smoker. S/p cerebral angiogram 11/21/14. S/p radiology with anesthesia embolization 08/08/14. S/p cerebral angiogram 02/27/15  Pt had right MCA territory infarct in September 2015, found to have near occlusion of the right ICA and a left MCA unruptured aneurysm. He underwent elective coiling with stent placement on 08/08/2014 (they also found a R MCA aneurysm during the procedure). 08/11/2014 MRI for symptoms showed hemorrhagic transformation of right MCA territory infarct, but this did not appear to be acute. TIA was suspected.   Medications include: albuterol, ASA, plavix, nexium, hctz, metformin, ramipril, spiriva.   Preoperative labs reviewed.   Chest x-ray 11/13/2014 reviewed. No active cardiopulmonary disease  EKG 09/19/15: Sinus bradycardia (55 bpm). Incomplete RBBB. Minimal voltage criteria for LVH, may be normal variant.  Echo 07/05/2014: - Left ventricle: The cavity size was normal. There was mild concentric hypertrophy. Systolic function was normal. The estimated ejection fraction was in the range of 55% to 60%. Images were inadequate for LV wall motion assessment. - Left atrium: The atrium was mildly to moderately dilated.  Carotid doppler US 07/05/2014: -Right ICA is occluded. Left: mild soft plaque origin ICA. 40-50% ICA stenosis, velocities may be elevated secondary to compensatory flow. Bilateral vertebral artery flow is antegrade.  Nuclear stress test 02/03/2010: 1. No areas of reversibility to suggest inducible ischemia. 2. Mild global hypokinesis. 3. Ejection fraction estimated at 43%.   If no changes, I  anticipate pt can proceed with surgery as scheduled.   Rica Mastngela Chipper Koudelka, FNP-BC [facility] Phone: [phone] 09/20/2015 3:39 PM

## 2015-09-19 NOTE — Progress Notes (Signed)
   09/19/15 0824  OBSTRUCTIVE SLEEP APNEA  Have you ever been diagnosed with sleep apnea through a sleep study? No  Do you snore loudly (loud enough to be heard through closed doors)?  1  Do you often feel tired, fatigued, or sleepy during the daytime (such as falling asleep during driving or talking to someone)? 1  Has anyone observed you stop breathing during your sleep? 1  Do you have, or are you being treated for high blood pressure? 1  BMI more than 35 kg/m2? 1  Age > 50 (1-yes) 1  Neck circumference greater than:Male 16 inches or larger, Male 17inches or larger? 1 56(19)  Male Gender (Yes=1) 1  Obstructive Sleep Apnea Score 8

## 2015-09-20 ENCOUNTER — Telehealth (HOSPITAL_COMMUNITY): Payer: Self-pay | Admitting: *Deleted

## 2015-09-20 NOTE — Telephone Encounter (Signed)
Called and spoke with patient. Per Dr Corliss Skainseveshwar patient to continue current medication regimen of Plavix 37.5mg  every other day and ASA 81mg  every other day.  Pt. Repeated instructions back.

## 2015-09-25 ENCOUNTER — Ambulatory Visit (HOSPITAL_COMMUNITY): Payer: Medicaid Other | Admitting: Certified Registered Nurse Anesthetist

## 2015-09-25 ENCOUNTER — Ambulatory Visit (HOSPITAL_COMMUNITY): Payer: Medicaid Other | Admitting: Emergency Medicine

## 2015-09-25 ENCOUNTER — Encounter (HOSPITAL_COMMUNITY)
Admission: RE | Disposition: A | Discharge status: Home / Self Care | Payer: Self-pay | Source: Ambulatory Visit | Attending: Interventional Radiology

## 2015-09-25 ENCOUNTER — Encounter (HOSPITAL_COMMUNITY): Payer: Self-pay | Admitting: *Deleted

## 2015-09-25 ENCOUNTER — Encounter (HOSPITAL_COMMUNITY): Payer: Self-pay

## 2015-09-25 ENCOUNTER — Ambulatory Visit (HOSPITAL_COMMUNITY)
Admission: RE | Admit: 2015-09-25 | Discharge: 2015-09-25 | Disposition: A | Discharge status: Home / Self Care | Payer: Medicaid Other | Source: Ambulatory Visit | Attending: Interventional Radiology | Admitting: Interventional Radiology

## 2015-09-25 ENCOUNTER — Inpatient Hospital Stay (HOSPITAL_COMMUNITY)
Admission: RE | Admit: 2015-09-25 | Discharge: 2015-09-26 | DRG: 036 | Disposition: A | Discharge status: Home / Self Care | Payer: Medicaid Other | Source: Ambulatory Visit | Attending: Interventional Radiology | Admitting: Interventional Radiology

## 2015-09-25 DIAGNOSIS — Z79899 Other long term (current) drug therapy: Secondary | ICD-10-CM | POA: Diagnosis not present

## 2015-09-25 DIAGNOSIS — E785 Hyperlipidemia, unspecified: Secondary | ICD-10-CM | POA: Diagnosis present

## 2015-09-25 DIAGNOSIS — Z8673 Personal history of transient ischemic attack (TIA), and cerebral infarction without residual deficits: Secondary | ICD-10-CM

## 2015-09-25 DIAGNOSIS — I129 Hypertensive chronic kidney disease with stage 1 through stage 4 chronic kidney disease, or unspecified chronic kidney disease: Secondary | ICD-10-CM | POA: Diagnosis present

## 2015-09-25 DIAGNOSIS — I671 Cerebral aneurysm, nonruptured: Secondary | ICD-10-CM | POA: Diagnosis present

## 2015-09-25 DIAGNOSIS — K219 Gastro-esophageal reflux disease without esophagitis: Secondary | ICD-10-CM | POA: Diagnosis present

## 2015-09-25 DIAGNOSIS — F319 Bipolar disorder, unspecified: Secondary | ICD-10-CM | POA: Diagnosis present

## 2015-09-25 DIAGNOSIS — I6521 Occlusion and stenosis of right carotid artery: Secondary | ICD-10-CM

## 2015-09-25 DIAGNOSIS — Z888 Allergy status to other drugs, medicaments and biological substances status: Secondary | ICD-10-CM

## 2015-09-25 DIAGNOSIS — Z7984 Long term (current) use of oral hypoglycemic drugs: Secondary | ICD-10-CM | POA: Diagnosis not present

## 2015-09-25 DIAGNOSIS — Z7982 Long term (current) use of aspirin: Secondary | ICD-10-CM

## 2015-09-25 DIAGNOSIS — Z87891 Personal history of nicotine dependence: Secondary | ICD-10-CM | POA: Diagnosis not present

## 2015-09-25 DIAGNOSIS — E119 Type 2 diabetes mellitus without complications: Secondary | ICD-10-CM | POA: Diagnosis present

## 2015-09-25 DIAGNOSIS — N189 Chronic kidney disease, unspecified: Secondary | ICD-10-CM | POA: Diagnosis present

## 2015-09-25 DIAGNOSIS — J449 Chronic obstructive pulmonary disease, unspecified: Secondary | ICD-10-CM | POA: Diagnosis present

## 2015-09-25 DIAGNOSIS — Z91048 Other nonmedicinal substance allergy status: Secondary | ICD-10-CM | POA: Diagnosis not present

## 2015-09-25 DIAGNOSIS — I771 Stricture of artery: Secondary | ICD-10-CM

## 2015-09-25 DIAGNOSIS — I6529 Occlusion and stenosis of unspecified carotid artery: Secondary | ICD-10-CM | POA: Diagnosis present

## 2015-09-25 HISTORY — PX: RADIOLOGY WITH ANESTHESIA: SHX6223

## 2015-09-25 LAB — POCT ACTIVATED CLOTTING TIME
Activated Clotting Time: 178 seconds
Activated Clotting Time: 173 seconds

## 2015-09-25 LAB — HEPARIN LEVEL (UNFRACTIONATED): Heparin Unfractionated: <0.1 IU/mL — ABNORMAL LOW (ref 0.30–0.70)

## 2015-09-25 LAB — GLUCOSE, CAPILLARY
Glucose-Capillary: 105 mg/dL — ABNORMAL HIGH (ref 65–99)
Glucose-Capillary: 101 mg/dL — ABNORMAL HIGH (ref 65–99)
Glucose-Capillary: 104 mg/dL — ABNORMAL HIGH (ref 65–99)

## 2015-09-25 SURGERY — RADIOLOGY WITH ANESTHESIA
Anesthesia: General

## 2015-09-25 MED ORDER — SODIUM CHLORIDE 0.9 % IJ SOLN
25.0000 ug | INTRAVENOUS | Status: DC | PRN
  Administered 2015-09-25: 25 ug via INTRA_ARTERIAL

## 2015-09-25 MED ORDER — MEPERIDINE HCL 25 MG/ML IJ SOLN: 6.2500 mg | INTRAMUSCULAR | Status: DC | PRN

## 2015-09-25 MED ORDER — GLYCOPYRROLATE 0.2 MG/ML IJ SOLN
INTRAMUSCULAR | Status: DC | PRN
  Administered 2015-09-25: 0.1 mg via INTRAVENOUS
  Administered 2015-09-25: 0.2 mg via INTRAVENOUS
  Administered 2015-09-25: .6 mg via INTRAVENOUS

## 2015-09-25 MED ORDER — NIMODIPINE 30 MG PO CAPS: 0.0000 mg | ORAL_CAPSULE | ORAL | Status: DC

## 2015-09-25 MED ORDER — NITROGLYCERIN 1 MG/10 ML FOR IR/CATH LAB
INTRA_ARTERIAL | Status: AC
  Filled 2015-09-25: qty 10

## 2015-09-25 MED ORDER — PROMETHAZINE HCL 25 MG/ML IJ SOLN: 6.2500 mg | INTRAMUSCULAR | Status: DC | PRN

## 2015-09-25 MED ORDER — PHENYLEPHRINE HCL 10 MG/ML IJ SOLN
INTRAMUSCULAR | Status: DC | PRN
  Administered 2015-09-25 (×2): 40 ug via INTRAVENOUS

## 2015-09-25 MED ORDER — LIDOCAINE HCL (CARDIAC) 20 MG/ML IV SOLN
INTRAVENOUS | Status: DC | PRN
  Administered 2015-09-25: 60 mg via INTRAVENOUS

## 2015-09-25 MED ORDER — ONDANSETRON HCL 4 MG/2ML IJ SOLN
INTRAMUSCULAR | Status: DC | PRN
  Administered 2015-09-25: 4 mg via INTRAVENOUS

## 2015-09-25 MED ORDER — ROCURONIUM BROMIDE 100 MG/10ML IV SOLN
INTRAVENOUS | Status: DC | PRN
  Administered 2015-09-25: 20 mg via INTRAVENOUS
  Administered 2015-09-25: 50 mg via INTRAVENOUS

## 2015-09-25 MED ORDER — NICARDIPINE HCL IN NACL 20-0.86 MG/200ML-% IV SOLN
5.0000 mg/h | INTRAVENOUS | Status: DC
  Filled 2015-09-25: qty 200

## 2015-09-25 MED ORDER — PHENYLEPHRINE HCL 10 MG/ML IJ SOLN
10.0000 mg | INTRAVENOUS | Status: DC | PRN
  Administered 2015-09-25: 15 ug/min via INTRAVENOUS

## 2015-09-25 MED ORDER — PROPOFOL 10 MG/ML IV BOLUS
INTRAVENOUS | Status: DC | PRN
  Administered 2015-09-25: 150 mg via INTRAVENOUS

## 2015-09-25 MED ORDER — CEFAZOLIN SODIUM-DEXTROSE 2-3 GM-% IV SOLR
INTRAVENOUS | Status: AC
  Filled 2015-09-25: qty 50

## 2015-09-25 MED ORDER — ASPIRIN EC 81 MG PO TBEC
81.0000 mg | DELAYED_RELEASE_TABLET | Freq: Every day | ORAL | Status: DC
  Filled 2015-09-25: qty 1

## 2015-09-25 MED ORDER — EPHEDRINE SULFATE 50 MG/ML IJ SOLN
INTRAMUSCULAR | Status: DC | PRN
  Administered 2015-09-25: 10 mg via INTRAVENOUS
  Administered 2015-09-25 (×2): 5 mg via INTRAVENOUS
  Administered 2015-09-25: 10 mg via INTRAVENOUS

## 2015-09-25 MED ORDER — ASPIRIN 325 MG PO TABS
325.0000 mg | ORAL_TABLET | Freq: Every day | ORAL | Status: DC
  Administered 2015-09-26: 325 mg via ORAL
  Filled 2015-09-25: qty 1

## 2015-09-25 MED ORDER — HEPARIN (PORCINE) IN NACL 100-0.45 UNIT/ML-% IJ SOLN
INTRAMUSCULAR | Status: AC
Start: 2015-09-25 — End: 2015-09-26
  Filled 2015-09-25: qty 250

## 2015-09-25 MED ORDER — FENTANYL CITRATE (PF) 100 MCG/2ML IJ SOLN
INTRAMUSCULAR | Status: DC | PRN
  Administered 2015-09-25: 100 ug via INTRAVENOUS
  Administered 2015-09-25: 25 ug via INTRAVENOUS
  Administered 2015-09-25: 50 ug via INTRAVENOUS

## 2015-09-25 MED ORDER — HEPARIN (PORCINE) IN NACL 100-0.45 UNIT/ML-% IJ SOLN
1000.0000 [IU]/h | INTRAMUSCULAR | Status: AC
  Administered 2015-09-25: 800 [IU]/h via INTRAVENOUS
  Filled 2015-09-25: qty 250

## 2015-09-25 MED ORDER — SUCCINYLCHOLINE CHLORIDE 20 MG/ML IJ SOLN
INTRAMUSCULAR | Status: DC | PRN
  Administered 2015-09-25: 100 mg via INTRAVENOUS

## 2015-09-25 MED ORDER — HEPARIN SODIUM (PORCINE) 1000 UNIT/ML IJ SOLN
INTRAMUSCULAR | Status: DC | PRN
  Administered 2015-09-25 (×2): .5 mL via INTRAVENOUS
  Administered 2015-09-25: 3 mL via INTRAVENOUS

## 2015-09-25 MED ORDER — NEOSTIGMINE METHYLSULFATE 10 MG/10ML IV SOLN
INTRAVENOUS | Status: DC | PRN
  Administered 2015-09-25: 4 mg via INTRAVENOUS

## 2015-09-25 MED ORDER — CLOPIDOGREL BISULFATE 75 MG PO TABS
75.0000 mg | ORAL_TABLET | Freq: Every day | ORAL | Status: DC
  Administered 2015-09-26: 75 mg via ORAL
  Filled 2015-09-25: qty 1

## 2015-09-25 MED ORDER — LACTATED RINGERS IV SOLN
INTRAVENOUS | Status: DC | PRN
  Administered 2015-09-25 (×2): via INTRAVENOUS

## 2015-09-25 MED ORDER — CEFAZOLIN SODIUM-DEXTROSE 2-3 GM-% IV SOLR
2.0000 g | INTRAVENOUS | Status: AC
  Administered 2015-09-25: 2 g via INTRAVENOUS

## 2015-09-25 MED ORDER — LACTATED RINGERS IV SOLN
INTRAVENOUS | Status: DC
  Administered 2015-09-25: 08:00:00 via INTRAVENOUS

## 2015-09-25 MED ORDER — SODIUM CHLORIDE 0.9 % IV SOLN
Freq: Once | INTRAVENOUS | Status: DC
Start: 2015-09-25 — End: 2015-09-25

## 2015-09-25 MED ORDER — SODIUM CHLORIDE 0.9 % IV SOLN
INTRAVENOUS | Status: DC
  Administered 2015-09-25 – 2015-09-26 (×2): via INTRAVENOUS

## 2015-09-25 MED ORDER — HYDROMORPHONE HCL 1 MG/ML IJ SOLN: 0.2500 mg | INTRAMUSCULAR | Status: DC | PRN

## 2015-09-25 MED ORDER — ONDANSETRON HCL 4 MG/2ML IJ SOLN: 4.0000 mg | Freq: Four times a day (QID) | INTRAMUSCULAR | Status: DC | PRN

## 2015-09-25 MED ORDER — CLOPIDOGREL BISULFATE 75 MG PO TABS
37.5000 mg | ORAL_TABLET | Freq: Every day | ORAL | Status: DC
  Administered 2015-09-25: 37.5 mg via ORAL
  Filled 2015-09-25: qty 1

## 2015-09-25 MED ORDER — MIDAZOLAM HCL 5 MG/5ML IJ SOLN
INTRAMUSCULAR | Status: DC | PRN
  Administered 2015-09-25: 2 mg via INTRAVENOUS

## 2015-09-25 MED ORDER — PNEUMOCOCCAL VAC POLYVALENT 25 MCG/0.5ML IJ INJ
0.5000 mL | INJECTION | INTRAMUSCULAR | Status: DC
  Filled 2015-09-25: qty 0.5

## 2015-09-25 MED ORDER — LABETALOL HCL 5 MG/ML IV SOLN
INTRAVENOUS | Status: DC | PRN
  Administered 2015-09-25: 2.5 mg via INTRAVENOUS
  Administered 2015-09-25: 5 mg via INTRAVENOUS
  Administered 2015-09-25 (×3): 2.5 mg via INTRAVENOUS

## 2015-09-25 NOTE — Sedation Documentation (Signed)
6 Fr. Exoseal to right groin. 

## 2015-09-25 NOTE — Progress Notes (Signed)
nimotop not given hr 55 Bp 112/73 Spoke with Beckey DowningPam Turpin, PA asked if another P2y12 is needed. Pt has been on plavix and  aspirin every other day, with no changes in treatment. No need for another lab draw

## 2015-09-25 NOTE — Progress Notes (Signed)
ANTICOAGULATION CONSULT NOTE - Initial Consult  Pharmacy Consult for heparin Indication: s/p cerebral angiogram  Allergies  Allergen Reactions  . Tylox [Oxycodone-Acetaminophen] Hives, Other (See Comments) and Rash    Sweating, shaking Reaction: Tremors and diaphoresis   . Adhesive [Tape] Rash    Patient Measurements: Height: 5\' 8"  (172.7 cm) Weight: 243 lb (110.224 kg) IBW/kg (Calculated) : 68.4 Heparin Dosing Weight: 92.9kg  Vital Signs: Temp: 98 F (36.7 C) (12/14 1230) Temp Source: Oral (12/14 0716) BP: 121/59 mmHg (12/14 1230) Pulse Rate: 72 (12/14 1230)  Labs: No results for input(s): HGB, HCT, PLT, APTT, LABPROT, INR, HEPARINUNFRC, CREATININE, CKTOTAL, CKMB, TROPONINI in the last 72 hours.  Estimated Creatinine Clearance: 128.5 mL/min (by C-G formula based on Cr of 0.74).   Medical History: Past Medical History  Diagnosis Date  . Stroke (HCC)     2015  . H/O hiatal hernia   . Hypertension     takes Ramipril daily  . Hyperlipidemia     takes Ramipril daily  . Diabetes mellitus without complication (HCC)     takes Metformin daily  . COPD (chronic obstructive pulmonary disease) (HCC) 06/2012    uses Spiriva and Albuterol daily as needed  . Pneumonia     hx of-2014  . History of bronchitis 2014  . Headache(784.0)     "all the time"  . Aneurysm (HCC)     brain  . Umbilical hernia   . Numbness     both arms   . Burning pain     in both legs-seeing Dr.Sethi for this  . Arthritis   . Joint pain   . Joint swelling   . Back pain     reason unknown   . Nocturia   . Chronic kidney disease 2009    chronic kidney disease  . Complication of anesthesia   . Asthma     Last asthma attack at age 89; History of trach at 16 months  . Cataracts, bilateral   . PONV (postoperative nausea and vomiting)     Pt reports nausea only.  . Bipolar disorder (HCC)     was on meds but was taken off 2 yrs ago and none since  . Urinary frequency   . GERD (gastroesophageal  reflux disease)     was on Nexium;taking Omeprazole daily as needed    Medications:  Infusions:  . sodium chloride    . heparin    . lactated ringers 10 mL/hr at 09/25/15 0800  . niCARDipine      Assessment: 54 yom s/p cerebral angiogram. Pre-op CBC is WNL and he was not on anticoagulation PTA. To start heparin post-op.  Goal of Therapy:  Heparin level 0.1-0.25 units/ml Monitor platelets by anticoagulation protocol: Yes   Plan:  - Heparin gtt 800 units/hr - Check an 8 hour heparin level - CBC in AM  Kiarra Kidd, Drake Leachachel Lynn 09/25/2015,12:48 PM

## 2015-09-25 NOTE — Anesthesia Procedure Notes (Signed)
Procedure Name: Intubation Date/Time: 09/25/2015 9:20 AM Performed by: Reine JustFLOWERS, Xzavion Doswell T Pre-anesthesia Checklist: Patient identified, Emergency Drugs available, Suction available, Patient being monitored and Timeout performed Patient Re-evaluated:Patient Re-evaluated prior to inductionOxygen Delivery Method: Simple face mask and Circle system utilized Preoxygenation: Pre-oxygenation with 100% oxygen Intubation Type: IV induction Ventilation: Mask ventilation with difficulty and Oral airway inserted - appropriate to patient size Laryngoscope Size: Mac and 4 Grade View: Grade I Tube type: Subglottic suction tube Tube size: 7.5 mm Number of attempts: 1 Airway Equipment and Method: Patient positioned with wedge pillow and Stylet Placement Confirmation: ETT inserted through vocal cords under direct vision,  positive ETCO2 and breath sounds checked- equal and bilateral Secured at: 22 cm Tube secured with: Tape Dental Injury: Teeth and Oropharynx as per pre-operative assessment

## 2015-09-25 NOTE — H&P (Signed)
History of Present Illness: Ivan ClontsRichard L Sobiech is a 54 y.o. male with history of CVA, right internal carotid artery stenosis on ASA 81mg  every other day and Plavix 37.5mg  every other day. He has history of treated Left MCA intracranial aneurysm with findings of multiple intracranial aneurysms, last catheter angiogram was on 02/2015. He underwent cerebral angio on 11/11 confirming severe stenosis of (R)ICA and is scheduled today for (R)ICA stent assisted angioplasty His labs including P2Y12 last were acceptable for procedure. No other issues in the interim. Has been NPO except meds this am  Past Medical History  Diagnosis Date  . Stroke (HCC)     2015  . H/O hiatal hernia   . Hypertension     takes Ramipril daily  . Hyperlipidemia     takes Ramipril daily  . Diabetes mellitus without complication (HCC)     takes Metformin daily  . COPD (chronic obstructive pulmonary disease) (HCC) 06/2012    uses Spiriva and Albuterol daily as needed  . Pneumonia     hx of-2014  . History of bronchitis 2014  . Headache(784.0)     "all the time"  . Aneurysm (HCC)     brain  . Umbilical hernia   . Numbness     both arms   . Burning pain     in both legs-seeing Dr.Sethi for this  . Arthritis   . Joint pain   . Joint swelling   . Back pain     reason unknown   . Nocturia   . Chronic kidney disease 2009    chronic kidney disease  . Complication of anesthesia   . Asthma     Last asthma attack at age 89; History of trach at 16 months  . Cataracts, bilateral   . PONV (postoperative nausea and vomiting)     Pt reports nausea only.  . Bipolar disorder (HCC)     was on meds but was taken off 2 yrs ago and none since  . Urinary frequency   . GERD (gastroesophageal reflux disease)     was on Nexium;taking Omeprazole daily as needed    Past Surgical History  Procedure Laterality Date  . Tracheostomy closure    . Tracheostomy      at age 53 months old  . Rectal surgery    . Rotator cuff  repair Left 1996  . Radiology with anesthesia N/A 08/08/2014    Procedure: RADIOLOGY WITH ANESTHESIA EMBOLIZATION;  Surgeon: Medication Radiologist, MD;  Location: [facility] OR;  Service: Radiology;  Laterality: N/A;  . Knee arthroscopy Right   . Cyst removed from right wrist    . Angiogram with coiling    . Colonoscopy    . Radiology with anesthesia N/A 11/21/2014    Procedure: RADIOLOGY WITH ANESTHESIA;  Surgeon: Oneal GroutSanjeev K Deveshwar, MD;  Location: [facility] OR;  Service: Radiology;  Laterality: N/A;  . Radiology with anesthesia N/A 02/27/2015    Procedure: RADIOLOGY WITH ANESTHESIA;  Surgeon: Julieanne CottonSanjeev Deveshwar, MD;  Location: [facility] OR;  Service: Radiology;  Laterality: N/A;  . Rotator cuff repair Right 2016    Allergies: Tylox and Adhesive  Medications: Prior to Admission medications   Medication Sig Start Date End Date Taking? Authorizing Provider  albuterol (PROVENTIL HFA;VENTOLIN HFA) 108 (90 BASE) MCG/ACT inhaler Inhale 2 puffs into the lungs every 6 (six) hours as needed for wheezing or shortness of breath.    Yes Historical Provider, MD  aspirin EC 81 MG tablet Take 81 mg  by mouth every other day.   Yes Historical Provider, MD  atorvastatin (LIPITOR) 20 MG tablet Take 1 tablet (20 mg total) by mouth daily at 6 PM. 07/06/14  Yes Calvert Cantor, MD  clopidogrel (PLAVIX) 75 MG tablet Take 0.5 tablets (37.5 mg total) by mouth daily. Patient taking differently: Take 37.5 mg by mouth every other day.  08/14/14  Yes Jeralyn Bennett, MD  esomeprazole (NEXIUM) 40 MG capsule Take 40 mg by mouth at bedtime as needed (for acid reflux).    Yes Historical Provider, MD  hydrochlorothiazide (HYDRODIURIL) 25 MG tablet Take 25 mg by mouth daily.  07/12/15  Yes Historical Provider, MD  loratadine (CLARITIN) 10 MG tablet Take 10 mg by mouth daily as needed for allergies.  12/31/14 12/31/15 Yes Historical Provider, MD  metFORMIN (GLUCOPHAGE) 500 MG tablet Take 1 tablet (500 mg total) by mouth 2 (two) times daily with a meal.  07/06/14  Yes Calvert Cantor, MD  nitroGLYCERIN (NITROSTAT) 0.4 MG SL tablet Place 1 tablet (0.4 mg total) under the tongue every 5 (five) minutes x 3 doses as needed for chest pain. 07/08/14  Yes Orpah Cobb, MD  ramipril (ALTACE) 2.5 MG capsule Take 2.5 mg by mouth daily.   Yes Historical Provider, MD  tiotropium (SPIRIVA HANDIHALER) 18 MCG inhalation capsule Place 1 capsule (18 mcg total) into inhaler and inhale every morning. Patient taking differently: Place 18 mcg into inhaler and inhale as needed (for shortness of breath).  08/14/14  Yes Jeralyn Bennett, MD  triamcinolone cream (KENALOG) 0.1 % Apply 1 application topically 2 (two) times daily.   Yes Historical Provider, MD  gabapentin (NEURONTIN) 100 MG capsule TAKE 1 CAPSULE BY MOUTH THREE TIMES DAILY Patient not taking: Reported on 08/20/2015 01/09/15   Marvel Plan, MD     Family History  Problem Relation Age of Onset  . Heart disease Mother     s/p 3V CABG  . Cancer Father 93    lung cancer; +tobacco  . Cancer Maternal Grandmother   . Heart disease Maternal Grandmother   . Cancer Paternal Grandmother   . Lupus Sister   . Multiple sclerosis Sister   . Anemia Daughter   . Stroke Father     Social History   Social History  . Marital Status: Married    Spouse Name: not together since 2007  . Number of Children: 3  . Years of Education: 10   Occupational History  . Engineer, production     [employer]    Social History Main Topics  . Smoking status: Former Smoker -- 1.00 packs/day    Types: Cigarettes    Start date: 01/15/1975    Quit date: 06/05/2014  . Smokeless tobacco: Never Used     Comment: quit smoking in Aug 2015  . Alcohol Use: 2.4 oz/week    0 Standard drinks or equivalent, 4 Cans of beer per week     Comment: only on Wed  . Drug Use: No  . Sexual Activity:    Partners: Female     Comment: partner has had surgery to prevent pregnancy   Other Topics Concern  . None   Social History Narrative   Lives with his son  and his mother.  His son has no contact with the son's mother, though the patient is not legally separated from her.  She has a history of drug use and has been in prison several times.    Review of Systems: A 12 point ROS discussed and pertinent  positives are indicated in the HPI above.  All other systems are negative.  Review of Systems  Vital Signs:   Physical Exam  Constitutional: He is oriented to person, place, and time. No distress.  HENT:  Head: Normocephalic and atraumatic.  Mouth/Throat: Oropharynx is clear and moist.  Neck: Normal range of motion. No tracheal deviation present.  Cardiovascular: Normal rate, regular rhythm, normal heart sounds and intact distal pulses.  Exam reveals no gallop and no friction rub.   No murmur heard. Pulmonary/Chest: Effort normal and breath sounds normal. No respiratory distress. He has no wheezes. He has no rales.  Abdominal: Soft. Bowel sounds are normal. He exhibits no distension. There is no tenderness.  Neurological: He is alert and oriented to person, place, and time.  Speech clear, smile symmetrical, tongue midline, equal strength upper and lower extremities bilaterally 5/5, intact LE dorsiflexion and plantarflexion, EOMI  Skin: Skin is warm and dry. He is not diaphoretic.    Mallampati Score:  MD Evaluation Airway: WNL Heart: WNL Abdomen: WNL Chest/ Lungs: WNL  Imaging: No results found.  Labs:  CBC:  Recent Labs  11/21/14 0750 02/25/15 0843 06/09/15 1850 08/22/15 0744 09/19/15 0859  WBC 5.5 5.5  --  6.6 6.8  HGB 12.7* 13.9 14.6 13.3 13.5  HCT 37.4* 41.1 43.0 39.7 40.2  PLT 184 208  --  223 230    COAGS:  Recent Labs  11/21/14 0750 02/25/15 0843 08/22/15 0744 09/19/15 0859  INR 1.03 0.90 1.03 1.11  APTT BMP:  Recent Labs  02/25/15 0843 06/09/15 1850 08/22/15 0744 09/17/15 0740 09/19/15 0859  NA 139 140 134* 137 137  K 4.2 3.9 3.8 3.7 3.9  CL 104 100* 102 100* 103  CO2 28  --   GLUCOSE 92 104* 105* 123* 108*  BUN CALCIUM 9.2  --  8.9 9.4 8.9  CREATININE 0.78 0.90 0.76 0.79 0.74  GFRNONAA >60  --  >60 >60 >60  GFRAA >60  --  >60 >60 >60    LIVER FUNCTION TESTS:  Recent Labs  11/21/14 0750 02/25/15 0843 09/17/15 0740 09/19/15 0859  BILITOT 0.6 0.4 0.7 0.7  AST ALT ALKPHOS 63 61 60 56  PROT 6.2 7.1 6.9 6.6  ALBUMIN 3.7 4.2 3.9 3.8    Assessment and Plan: History of right cerebral ischemic CVA Right internal carotid artery stenosis on ASA  every other day and Plavix 37.5mg  every other day History of treated Left MCA intracranial aneurysm  Multiple intracranial aneurysms, last catheter angiogram 02/2015 Scheduled today for right ICA stent assisted angioplasty with general anesthesia Risks and Benefits discussed with the patient including, but not limited to stroke, death, bleeding, infection, vascular injury or contrast induced renal failure. All of the patient's questions were answered, patient is agreeable to proceed. Consent signed and in chart.  Consent signed and in chart.    SignedBrayton El 09/25/2015, 8:51 AM

## 2015-09-25 NOTE — Progress Notes (Signed)
Soft neck collar applied

## 2015-09-25 NOTE — Progress Notes (Signed)
ANTICOAGULATION CONSULT NOTE  Pharmacy Consult for heparin Indication: s/p cerebral angiogram  Allergies  Allergen Reactions  . Tylox [Oxycodone-Acetaminophen] Hives, Other (See Comments) and Rash    Sweating, shaking Reaction: Tremors and diaphoresis   . Adhesive [Tape] Rash    Patient Measurements: Height: 5\' 8"  (172.7 cm) Weight: 248 lb 7.3 oz (112.7 kg) IBW/kg (Calculated) : 68.4 Heparin Dosing Weight: 92.9kg  Vital Signs: Temp: 97.6 F (36.4 C) (12/14 2000) Temp Source: Oral (12/14 2000) BP: 118/67 mmHg (12/14 2100) Pulse Rate: 61 (12/14 2100)  Labs:  Recent Labs  09/25/15 2100  HEPARINUNFRC <0.10*    Estimated Creatinine Clearance: 130 mL/min (by C-G formula based on Cr of 0.74).   Medical History: Past Medical History  Diagnosis Date  . Stroke (HCC)     2015  . H/O hiatal hernia   . Hypertension     takes Ramipril daily  . Hyperlipidemia     takes Ramipril daily  . Diabetes mellitus without complication (HCC)     takes Metformin daily  . COPD (chronic obstructive pulmonary disease) (HCC) 06/2012    uses Spiriva and Albuterol daily as needed  . Pneumonia     hx of-2014  . History of bronchitis 2014  . Headache(784.0)     "all the time"  . Aneurysm (HCC)     brain  . Umbilical hernia   . Numbness     both arms   . Burning pain     in both legs-seeing Dr.Sethi for this  . Arthritis   . Joint pain   . Joint swelling   . Back pain     reason unknown   . Nocturia   . Chronic kidney disease 2009    chronic kidney disease  . Complication of anesthesia   . Asthma     Last asthma attack at age 89; History of trach at 16 months  . Cataracts, bilateral   . PONV (postoperative nausea and vomiting)     Pt reports nausea only.  . Bipolar disorder (HCC)     was on meds but was taken off 2 yrs ago and none since  . Urinary frequency   . GERD (gastroesophageal reflux disease)     was on Nexium;taking Omeprazole daily as needed    Medications:   Infusions:  . sodium chloride 75 mL/hr at 09/25/15 2100  . heparin 800 Units/hr (09/25/15 2000)  . lactated ringers 10 mL/hr at 09/25/15 0800  . niCARDipine      Assessment: 54 yom s/p cerebral angiogram. Pre-op CBC is WNL and he was not on anticoagulation PTA. To start heparin post-op.  Initial HL low at < 0.10  Goal of Therapy:  Heparin level 0.1-0.25 units/ml Monitor platelets by anticoagulation protocol: Yes   Plan:  - Heparin gtt to 1000 units / hr - Heparin off at 7 am - CBC in AM  [name], [name] 09/25/2015,10:29 PM

## 2015-09-25 NOTE — Procedures (Signed)
S/P RT ICA stent assisted angioplasty with distal protection

## 2015-09-25 NOTE — Anesthesia Preprocedure Evaluation (Addendum)
Anesthesia Evaluation  Patient identified by MRN, date of birth, ID band Patient awake    Reviewed: Allergy & Precautions, NPO status , Patient's Chart, lab work & pertinent test results  History of Anesthesia Complications (+) PONV and history of anesthetic complications  Airway Mallampati: II  TM Distance: >3 FB Neck ROM: Full    Dental no notable dental hx. (+) Edentulous Upper, Edentulous Lower, Dental Advisory Given   Pulmonary asthma , COPD, former smoker,    Pulmonary exam normal breath sounds clear to auscultation       Cardiovascular hypertension, + Peripheral Vascular Disease  Normal cardiovascular exam Rhythm:Regular Rate:Normal     Neuro/Psych  Headaches, Bipolar Disorder TIACVA    GI/Hepatic hiatal hernia, GERD  ,  Endo/Other  diabetesMorbid obesity  Renal/GU Renal disease     Musculoskeletal  (+) Arthritis ,   Abdominal   Peds  Hematology   Anesthesia Other Findings   Reproductive/Obstetrics                            Anesthesia Physical  Anesthesia Plan  ASA: III  Anesthesia Plan: General   Post-op Pain Management:    Induction: Intravenous  Airway Management Planned: Oral ETT  Additional Equipment:   Intra-op Plan:   Post-operative Plan: Extubation in OR  Informed Consent: I have reviewed the patients History and Physical, chart, labs and discussed the procedure including the risks, benefits and alternatives for the proposed anesthesia with the patient or authorized representative who has indicated his/her understanding and acceptance.   Dental advisory given  Plan Discussed with: CRNA  Anesthesia Plan Comments:         Anesthesia Quick Evaluation                                  Anesthesia Evaluation  Patient identified by MRN, date of birth, ID band Patient awake    Reviewed: Allergy & Precautions, NPO status , Patient's Chart, lab work &  pertinent test results  Airway        Dental   Pulmonary asthma , COPDformer smoker,          Cardiovascular hypertension, + Peripheral Vascular Disease     Neuro/Psych  Headaches, Bipolar Disorder TIACVA    GI/Hepatic hiatal hernia, GERD-  ,  Endo/Other  diabetesMorbid obesity  Renal/GU Renal disease     Musculoskeletal  (+) Arthritis -,   Abdominal   Peds  Hematology   Anesthesia Other Findings   Reproductive/Obstetrics                             Anesthesia Physical Anesthesia Plan  ASA: III  Anesthesia Plan: General and MAC   Post-op Pain Management:    Induction: Intravenous  Airway Management Planned: Oral ETT and Mask  Additional Equipment:   Intra-op Plan:   Post-operative Plan: Extubation in OR  Informed Consent: I have reviewed the patients History and Physical, chart, labs and discussed the procedure including the risks, benefits and alternatives for the proposed anesthesia with the patient or authorized representative who has indicated his/her understanding and acceptance.     Plan Discussed with: CRNA, Anesthesiologist and Surgeon  Anesthesia Plan Comments:         Anesthesia Quick Evaluation

## 2015-09-25 NOTE — Sedation Documentation (Signed)
Pt brought to IR #2 by anesthesia. Awake and alert. In no distress. MAE well. Speech clear and appropriate.

## 2015-09-25 NOTE — Transfer of Care (Signed)
Immediate Anesthesia Transfer of Care Note  Patient: [name]  Procedure(s) Performed: Procedure(s): RADIOLOGY WITH ANESTHESIA (N/A)  Patient Location: PACU  Anesthesia Type:General  Level of Consciousness: awake, alert  and oriented  Airway & Oxygen Therapy: Patient Spontanous Breathing and Patient connected to nasal cannula oxygen  Post-op Assessment: Report given to RN, Post -op Vital signs reviewed and stable and Patient moving all extremities X 4  Post vital signs: Reviewed and stable  Last Vitals:  Filed Vitals:   09/25/15 0716  BP: 112/73  Pulse: 55  Temp: 36.4 C  Resp: 20    Complications: No apparent anesthesia complications

## 2015-09-25 NOTE — Progress Notes (Signed)
Seen in Neuro ICU S/p (R)ICA stent angioplasty Feels good. No HA, N/V  BP 109/56 mmHg  Pulse 64  Temp(Src) 97.8 F (36.6 C) (Oral)  Resp 22  Ht 5\' 8"  (1.727 m)  Wt 248 lb 7.3 oz (112.7 kg)  BMI 37.79 kg/m2  SpO2 95% Neuro exam: no focal deficits Groin dressing clean/dry  Hepatin IV overnight Cardene titrate gtt  Re-eval in am D/w Drucie IpFamily  Callaway Hardigree PA-C Interventional Radiology 09/25/2015 3:00 PM

## 2015-09-26 ENCOUNTER — Encounter (HOSPITAL_COMMUNITY): Payer: Self-pay | Admitting: Interventional Radiology

## 2015-09-26 LAB — CBC WITH DIFFERENTIAL/PLATELET
BASOS ABS: 0 10*3/uL (ref 0.0–0.1)
Basophils Relative: 0 %
EOS PCT: 2 %
Eosinophils Absolute: 0.1 10*3/uL (ref 0.0–0.7)
HEMATOCRIT: 33.8 % — AB (ref 39.0–52.0)
Hemoglobin: 11.2 g/dL — ABNORMAL LOW (ref 13.0–17.0)
LYMPHS ABS: 2 10*3/uL (ref 0.7–4.0)
LYMPHS PCT: 28 %
MCH: 29.4 pg (ref 26.0–34.0)
MCHC: 33.1 g/dL (ref 30.0–36.0)
MCV: 88.7 fL (ref 78.0–100.0)
MONO ABS: 0.5 10*3/uL (ref 0.1–1.0)
MONOS PCT: 7 %
NEUTROS ABS: 4.6 10*3/uL (ref 1.7–7.7)
Neutrophils Relative %: 63 %
PLATELETS: 191 10*3/uL (ref 150–400)
RBC: 3.81 MIL/uL — ABNORMAL LOW (ref 4.22–5.81)
RDW: 12.8 % (ref 11.5–15.5)
WBC: 7.2 10*3/uL (ref 4.0–10.5)

## 2015-09-26 LAB — BASIC METABOLIC PANEL
ANION GAP: 6 (ref 5–15)
BUN: 7 mg/dL (ref 6–20)
CHLORIDE: 105 mmol/L (ref 101–111)
CO2: 26 mmol/L (ref 22–32)
Calcium: 8.2 mg/dL — ABNORMAL LOW (ref 8.9–10.3)
Creatinine, Ser: 0.72 mg/dL (ref 0.61–1.24)
GFR calc Af Amer: >60 mL/min (ref >60)
GFR calc non Af Amer: >60 mL/min (ref >60)
GLUCOSE: 113 mg/dL — AB (ref 65–99)
POTASSIUM: 4.2 mmol/L (ref 3.5–5.1)
Sodium: 137 mmol/L (ref 135–145)

## 2015-09-26 NOTE — Progress Notes (Signed)
Discharge instructions given; patient and family members expressed understanding. All other questions were answered to their satisfaction. Patient taken out to POV via wheelchair without complaint.

## 2015-09-26 NOTE — Discharge Summary (Signed)
Patient ID: Ivan ClontsRichard L Burridge MRN: [id number] DOB/AGE: 53/02/1961 53 y.o.  Admit date: 09/25/2015 Discharge date: 09/26/2015  Admission Diagnoses: Right Internal carotid artery stenosis  Discharge Diagnoses:  Active Problems:   Carotid stenosis, symptomatic w/o infarct   Discharged Condition: stable  Hospital Course: Right Internal Carotid Artery stenosis. Angioplasty stent placed in IR with Dr Corliss Skainseveshwar 09/25/15. Pt tolerated procedure well Was admitted to Neuro ICU overnight. Overnight stay was uneventful; slept well; eating well Denies headache; N/V No vision or hearing complaints Moves all 4s with good strength Ambulating Passing gas; and urinating Dr Corliss Skainseveshwar has seen and examined pt Plan for discharge home now  Consults: None  Significant Diagnostic Studies: Cerebral arteriogram  Treatments: S/P RT ICA stent assisted angioplasty with distal protection  Discharge Exam: Blood pressure 96/46, pulse 53, temperature 97.7 F (36.5 C), temperature source Oral, resp. rate 16, height 5\' 8"  (1.727 m), weight 248 lb 7.3 oz (112.7 kg), SpO2 95 %.  PE:  A/O Appropriate Pleasant Face symmetrical; smile = Tongue midline Heart: RRR Lungs: CTA Extr: FROM; good = strength Good = sensation Rt groin: NT, no bleeding No hematoma Rt foot 2+ pulses UOP good- yellow  Results for orders placed or performed during the hospital encounter of 09/25/15  Glucose, capillary  Result Value Ref Range   Glucose-Capillary 105 (H) 65 - 99 mg/dL  Glucose, capillary  Result Value Ref Range   Glucose-Capillary 104 (H) 65 - 99 mg/dL   Comment 1 Notify RN    Comment 2 Document in Chart   Heparin level (unfractionated)  Result Value Ref Range   Heparin Unfractionated <0.10 (L) 0.30 - 0.70 IU/mL  Glucose, capillary  Result Value Ref Range   Glucose-Capillary 101 (H) 65 - 99 mg/dL  Basic metabolic panel  Result Value Ref Range   Sodium 137 135 - 145 mmol/L   Potassium 4.2 3.5 - 5.1  mmol/L   Chloride 105 101 - 111 mmol/L   CO2 26 22 - 32 mmol/L   Glucose, Bld 113 (H) 65 - 99 mg/dL   BUN 7 6 - 20 mg/dL   Creatinine, Ser 4.090.72 0.61 - 1.24 mg/dL   Calcium 8.2 (L) 8.9 - 10.3 mg/dL   GFR calc non Af Amer >60 >60 mL/min   GFR calc Af Amer >60 >60 mL/min   Anion gap 6 5 - 15  CBC WITH DIFFERENTIAL  Result Value Ref Range   WBC 7.2 4.0 - 10.5 K/uL   RBC 3.81 (L) 4.22 - 5.81 MIL/uL   Hemoglobin 11.2 (L) 13.0 - 17.0 g/dL   HCT 81.133.8 (L) 91.439.0 - 78.252.0 %   MCV 88.7 78.0 - 100.0 fL   MCH 29.4 26.0 - 34.0 pg   MCHC 33.1 30.0 - 36.0 g/dL   RDW 95.612.8 21.311.5 - 08.615.5 %   Platelets 191 150 - 400 K/uL   Neutrophils Relative % 63 %   Neutro Abs 4.6 1.7 - 7.7 K/uL   Lymphocytes Relative 28 %   Lymphs Abs 2.0 0.7 - 4.0 K/uL   Monocytes Relative 7 %   Monocytes Absolute 0.5 0.1 - 1.0 K/uL   Eosinophils Relative 2 %   Eosinophils Absolute 0.1 0.0 - 0.7 K/uL   Basophils Relative 0 %   Basophils Absolute 0.0 0.0 - 0.1 K/uL    Disposition: R ICA angioplasty/stent placed in IR with Dr Corliss Skainseveshwar 09/25/15 Has done well overnight No complaints Plan for DC to home now Continue all home meds and ASA/Plavix To see  Dr [name] 2 week follow up Pt has good understanding of instructions and plan  Discharge Instructions    Call MD for:  difficulty breathing, headache or visual disturbances    Complete by:  As directed      Call MD for:  extreme fatigue    Complete by:  As directed      Call MD for:  hives    Complete by:  As directed      Call MD for:  persistant dizziness or light-headedness    Complete by:  As directed      Call MD for:  persistant nausea and vomiting    Complete by:  As directed      Call MD for:  redness, tenderness, or signs of infection (pain, swelling, redness, odor or green/yellow discharge around incision site)    Complete by:  As directed      Call MD for:  severe uncontrolled pain    Complete by:  As directed      Call MD for:  temperature >100.4     Complete by:  As directed      Diet - low sodium heart healthy    Complete by:  As directed      Discharge instructions    Complete by:  As directed   Follow up with Dr [name] 2 weeks---pt will hear from scheduler with time and date; call [phone] if questions/concerns---- Continue all home meds---continue ASA/Plavix as pre procedure---     Discharge wound care:    Complete by:  As directed   May shower today; keep new fresh band aid on Rt groin site daily x 1 week     Driving Restrictions    Complete by:  As directed   No driving x 2 weeks     Increase activity slowly    Complete by:  As directed      Lifting restrictions    Complete by:  As directed   no lifting over 10 lbs x 2 weeks            Medication List    TAKE these medications        albuterol 108 (90 BASE) MCG/ACT inhaler  Commonly known as:  PROVENTIL HFA;VENTOLIN HFA  Inhale 2 puffs into the lungs every 6 (six) hours as needed for wheezing or shortness of breath.     aspirin EC 81 MG tablet  Take 81 mg by mouth every other day.     atorvastatin 20 MG tablet  Commonly known as:  LIPITOR  Take 1 tablet (20 mg total) by mouth daily at 6 PM.     clopidogrel 75 MG tablet  Commonly known as:  PLAVIX  Take 0.5 tablets (37.5 mg total) by mouth daily.     esomeprazole 40 MG capsule  Commonly known as:  NEXIUM  Take 40 mg by mouth at bedtime as needed (for acid reflux).     gabapentin 100 MG capsule  Commonly known as:  NEURONTIN  TAKE 1 CAPSULE BY MOUTH THREE TIMES DAILY     hydrochlorothiazide 25 MG tablet  Commonly known as:  HYDRODIURIL  Take 25 mg by mouth daily.     loratadine 10 MG tablet  Commonly known as:  CLARITIN  Take 10 mg by mouth daily as needed for allergies.     metFORMIN 500 MG tablet  Commonly known as:  GLUCOPHAGE  Take 1 tablet (500 mg total) by mouth 2 (two) times daily with a  meal.     nitroGLYCERIN 0.4 MG SL tablet  Commonly known as:  NITROSTAT  Place 1 tablet (0.4  mg total) under the tongue every 5 (five) minutes x 3 doses as needed for chest pain.     ramipril 5 MG capsule  Commonly known as:  ALTACE  Take 5 mg by mouth daily.     tiotropium 18 MCG inhalation capsule  Commonly known as:  SPIRIVA HANDIHALER  Place 1 capsule (18 mcg total) into inhaler and inhale every morning.     triamcinolone cream 0.1 %  Commonly known as:  KENALOG  Apply 1 application topically 2 (two) times daily.           Follow-up Information    Follow up with DEVESHWAR, Grandville Silos, MD In 2 weeks.   Specialty:  Interventional Radiology   Why:  pt will hear from scheduler for 2 week appt date and time   Contact information:   [address] [place] [address] [phone]        Signed: Ankith Edmonston A 09/26/2015, 9:55 AM   I have spent Greater Than 30 Minutes discharging [name].

## 2015-09-26 NOTE — Anesthesia Postprocedure Evaluation (Signed)
Anesthesia Post Note  Patient: Ivan ClontsRichard L [name]  Procedure(s) Performed: Procedure(s) (LRB): RADIOLOGY WITH ANESTHESIA (N/A)  Patient location during evaluation: PACU Anesthesia Type: General Level of consciousness: awake and alert and patient cooperative Pain management: pain level controlled Vital Signs Assessment: post-procedure vital signs reviewed and stable Respiratory status: spontaneous breathing and respiratory function stable Cardiovascular status: stable Anesthetic complications: no    Last Vitals:  Filed Vitals:   09/26/15 0700 09/26/15 0800  BP: 127/71 96/46  Pulse: 53 53  Temp:  36.5 C  Resp: 12 16    Last Pain:  Filed Vitals:   09/26/15 0843  PainSc: 0-No pain                 Powell Halbert S

## 2015-09-26 NOTE — Progress Notes (Signed)
Referring Physician(s): Pearlean Brownie  Chief Complaint:  Rt ICA angioplasty/stent placed 12/14  Subjective:  Doing well this am Slept well No complaints Denies headache; N/V Slight sore throat Moving all 4s well   Allergies: Tylox and Adhesive  Medications: Prior to Admission medications   Medication Sig Start Date End Date Taking? Authorizing Provider  albuterol (PROVENTIL HFA;VENTOLIN HFA) 108 (90 BASE) MCG/ACT inhaler Inhale 2 puffs into the lungs every 6 (six) hours as needed for wheezing or shortness of breath.    Yes Historical Provider, MD  aspirin EC 81 MG tablet Take 81 mg by mouth every other day.   Yes Historical Provider, MD  atorvastatin (LIPITOR) 20 MG tablet Take 1 tablet (20 mg total) by mouth daily at 6 PM. 07/06/14  Yes Calvert Cantor, MD  clopidogrel (PLAVIX) 75 MG tablet Take 0.5 tablets (37.5 mg total) by mouth daily. Patient taking differently: Take 37.5 mg by mouth every other day.  08/14/14  Yes Jeralyn Bennett, MD  esomeprazole (NEXIUM) 40 MG capsule Take 40 mg by mouth at bedtime as needed (for acid reflux).    Yes Historical Provider, MD  gabapentin (NEURONTIN) 100 MG capsule TAKE 1 CAPSULE BY MOUTH THREE TIMES DAILY 08/25/15  Yes Marvel Plan, MD  hydrochlorothiazide (HYDRODIURIL) 25 MG tablet Take 25 mg by mouth daily.  07/12/15  Yes Historical Provider, MD  loratadine (CLARITIN) 10 MG tablet Take 10 mg by mouth daily as needed for allergies.  12/31/14 12/31/15 Yes Historical Provider, MD  metFORMIN (GLUCOPHAGE) 500 MG tablet Take 1 tablet (500 mg total) by mouth 2 (two) times daily with a meal. 07/06/14  Yes Calvert Cantor, MD  nitroGLYCERIN (NITROSTAT) 0.4 MG SL tablet Place 1 tablet (0.4 mg total) under the tongue every 5 (five) minutes x 3 doses as needed for chest pain. 07/08/14  Yes Orpah Cobb, MD  ramipril (ALTACE) 5 MG capsule Take 5 mg by mouth daily. 09/03/15  Yes Historical Provider, MD  tiotropium (SPIRIVA HANDIHALER) 18 MCG inhalation capsule Place 1  capsule (18 mcg total) into inhaler and inhale every morning. Patient taking differently: Place 18 mcg into inhaler and inhale as needed (for shortness of breath).  08/14/14  Yes Jeralyn Bennett, MD  triamcinolone cream (KENALOG) 0.1 % Apply 1 application topically 2 (two) times daily.   Yes Historical Provider, MD     Vital Signs: BP 127/71 mmHg  Pulse 53  Temp(Src) 98.2 F (36.8 C) (Oral)  Resp 12  Ht  (1.727 m)  Wt 248 lb 7.3 oz (112.7 kg)  BMI 37.79 kg/m2  SpO2 90%  Physical Exam  Constitutional: He is oriented to person, place, and time. He appears well-nourished.  Eyes: EOM are normal.  Neck: Neck supple.  Cardiovascular: Normal rate and regular rhythm.   Pulmonary/Chest: Effort normal and breath sounds normal. He has no wheezes.  Abdominal: Soft. Bowel sounds are normal.  Musculoskeletal: Normal range of motion.  Rt groin NT; no bleeding No hematoma Rt foot 2+ pulses  Neurological: He is alert and oriented to person, place, and time.  Skin: Skin is warm and dry.  Psychiatric: He has a normal mood and affect. His behavior is normal. Judgment and thought content normal.  Nursing note and vitals reviewed.   Imaging: No results found.  Labs:  CBC:  Recent Labs  02/25/15 0843 06/09/15 1850 08/22/15 0744 09/19/15 0859 09/26/15 0520  WBC 5.5  --  6.6 6.8 7.2  HGB 13.9 14.6 13.3 13.5 11.2*  HCT 41.1 43.0  39.7 40.2 33.8*  PLT 208  --  223 230 191    COAGS:  Recent Labs  11/21/14 0750 02/25/15 0843 08/22/15 0744 09/19/15 0859  INR 1.03 0.90 1.03 1.11  APTT 29 28 29 29     BMP:  Recent Labs  08/22/15 0744 09/17/15 0740 09/19/15 0859 09/26/15 0520  NA 134* 137 137 137  K 3.8 3.7 3.9 4.2  CL 102 100* 103 105  CO2 26 29 26 26   GLUCOSE 105* 123* 108* 113*  BUN 13 10 10 7   CALCIUM 8.9 9.4 8.9 8.2*  CREATININE 0.76 0.79 0.74 0.72  GFRNONAA >60 >60 >60 >60  GFRAA >60 >60 >60 >60    LIVER FUNCTION TESTS:  Recent Labs  11/21/14 0750  02/25/15 0843 09/17/15 0740 09/19/15 0859  BILITOT 0.6 0.4 0.7 0.7  AST 17 18 21 18   ALT 23 24 21 19   ALKPHOS 63 61 60 56  PROT 6.2 7.1 6.9 6.6  ALBUMIN 3.7 4.2 3.9 3.8    Assessment and Plan:  R Internal carotid artery pta/stent 12/14 Doing well this am Will discuss with Dr Corliss Skainseveshwar Plan for dc this am  Signed: Tiyonna Sardinha A 09/26/2015, 7:23 AM   I spent a total of 15 Minutes at the the patient's bedside AND on the patient's hospital floor or unit, greater than 50% of which was counseling/coordinating care for R ICA pta/stent

## 2015-09-30 ENCOUNTER — Other Ambulatory Visit (HOSPITAL_COMMUNITY): Payer: Self-pay | Admitting: Interventional Radiology

## 2015-09-30 DIAGNOSIS — I771 Stricture of artery: Secondary | ICD-10-CM

## 2015-10-01 ENCOUNTER — Other Ambulatory Visit (HOSPITAL_COMMUNITY): Payer: Self-pay | Admitting: Interventional Radiology

## 2015-10-01 DIAGNOSIS — R519 Headache, unspecified: Secondary | ICD-10-CM

## 2015-10-01 DIAGNOSIS — H93A9 Pulsatile tinnitus, unspecified ear: Secondary | ICD-10-CM

## 2015-10-01 DIAGNOSIS — R51 Headache: Principal | ICD-10-CM

## 2015-10-16 ENCOUNTER — Ambulatory Visit (HOSPITAL_COMMUNITY)
Admission: RE | Admit: 2015-10-16 | Discharge: 2015-10-16 | Disposition: A | Discharge status: Home / Self Care | Payer: Medicaid Other | Source: Ambulatory Visit | Attending: Interventional Radiology | Admitting: Interventional Radiology

## 2015-10-16 DIAGNOSIS — I671 Cerebral aneurysm, nonruptured: Secondary | ICD-10-CM | POA: Insufficient documentation

## 2015-10-16 DIAGNOSIS — R519 Headache, unspecified: Secondary | ICD-10-CM

## 2015-10-16 DIAGNOSIS — R51 Headache: Secondary | ICD-10-CM | POA: Diagnosis not present

## 2015-10-16 DIAGNOSIS — H93A9 Pulsatile tinnitus, unspecified ear: Secondary | ICD-10-CM | POA: Diagnosis not present

## 2015-10-24 ENCOUNTER — Ambulatory Visit (HOSPITAL_COMMUNITY)
Admission: RE | Admit: 2015-10-24 | Discharge: 2015-10-24 | Disposition: A | Discharge status: Home / Self Care | Payer: Medicaid Other | Source: Ambulatory Visit | Attending: Radiology | Admitting: Radiology

## 2015-10-24 DIAGNOSIS — I6521 Occlusion and stenosis of right carotid artery: Secondary | ICD-10-CM

## 2015-10-24 LAB — PLATELET INHIBITION P2Y12: Platelet Function  P2Y12: 5 [PRU] — ABNORMAL LOW (ref 194–418)

## 2015-10-28 ENCOUNTER — Emergency Department (HOSPITAL_COMMUNITY)
Admission: EM | Admit: 2015-10-28 | Discharge: 2015-10-28 | Disposition: A | Discharge status: Home / Self Care | Payer: Medicaid Other | Attending: Emergency Medicine | Admitting: Emergency Medicine

## 2015-10-28 ENCOUNTER — Emergency Department (HOSPITAL_COMMUNITY): Payer: Medicaid Other

## 2015-10-28 ENCOUNTER — Encounter (HOSPITAL_COMMUNITY): Payer: Self-pay | Admitting: Family Medicine

## 2015-10-28 DIAGNOSIS — R51 Headache: Secondary | ICD-10-CM | POA: Insufficient documentation

## 2015-10-28 DIAGNOSIS — R200 Anesthesia of skin: Secondary | ICD-10-CM | POA: Diagnosis not present

## 2015-10-28 DIAGNOSIS — R42 Dizziness and giddiness: Secondary | ICD-10-CM | POA: Insufficient documentation

## 2015-10-28 DIAGNOSIS — F319 Bipolar disorder, unspecified: Secondary | ICD-10-CM | POA: Diagnosis not present

## 2015-10-28 DIAGNOSIS — H269 Unspecified cataract: Secondary | ICD-10-CM | POA: Insufficient documentation

## 2015-10-28 DIAGNOSIS — J441 Chronic obstructive pulmonary disease with (acute) exacerbation: Secondary | ICD-10-CM | POA: Insufficient documentation

## 2015-10-28 DIAGNOSIS — R110 Nausea: Secondary | ICD-10-CM | POA: Insufficient documentation

## 2015-10-28 DIAGNOSIS — M1990 Unspecified osteoarthritis, unspecified site: Secondary | ICD-10-CM | POA: Diagnosis not present

## 2015-10-28 DIAGNOSIS — N189 Chronic kidney disease, unspecified: Secondary | ICD-10-CM | POA: Insufficient documentation

## 2015-10-28 DIAGNOSIS — I129 Hypertensive chronic kidney disease with stage 1 through stage 4 chronic kidney disease, or unspecified chronic kidney disease: Secondary | ICD-10-CM | POA: Diagnosis not present

## 2015-10-28 DIAGNOSIS — Z8673 Personal history of transient ischemic attack (TIA), and cerebral infarction without residual deficits: Secondary | ICD-10-CM | POA: Diagnosis not present

## 2015-10-28 DIAGNOSIS — R079 Chest pain, unspecified: Secondary | ICD-10-CM | POA: Diagnosis not present

## 2015-10-28 DIAGNOSIS — Z87891 Personal history of nicotine dependence: Secondary | ICD-10-CM | POA: Diagnosis not present

## 2015-10-28 DIAGNOSIS — Z8701 Personal history of pneumonia (recurrent): Secondary | ICD-10-CM | POA: Insufficient documentation

## 2015-10-28 DIAGNOSIS — K219 Gastro-esophageal reflux disease without esophagitis: Secondary | ICD-10-CM | POA: Insufficient documentation

## 2015-10-28 DIAGNOSIS — E785 Hyperlipidemia, unspecified: Secondary | ICD-10-CM | POA: Insufficient documentation

## 2015-10-28 DIAGNOSIS — E119 Type 2 diabetes mellitus without complications: Secondary | ICD-10-CM | POA: Insufficient documentation

## 2015-10-28 LAB — BASIC METABOLIC PANEL
Anion gap: 11 (ref 5–15)
BUN: 14 mg/dL (ref 6–20)
CO2: 26 mmol/L (ref 22–32)
CREATININE: 0.99 mg/dL (ref 0.61–1.24)
Calcium: 9.4 mg/dL (ref 8.9–10.3)
Chloride: 103 mmol/L (ref 101–111)
GFR calc Af Amer: >60 mL/min (ref >60)
Glucose, Bld: 175 mg/dL — ABNORMAL HIGH (ref 65–99)
Potassium: 3.7 mmol/L (ref 3.5–5.1)
SODIUM: 140 mmol/L (ref 135–145)

## 2015-10-28 LAB — CBC
HCT: 41.7 % (ref 39.0–52.0)
Hemoglobin: 14 g/dL (ref 13.0–17.0)
MCH: 29.4 pg (ref 26.0–34.0)
MCHC: 33.6 g/dL (ref 30.0–36.0)
MCV: 87.6 fL (ref 78.0–100.0)
PLATELETS: 256 10*3/uL (ref 150–400)
RBC: 4.76 MIL/uL (ref 4.22–5.81)
RDW: 12.5 % (ref 11.5–15.5)
WBC: 6.3 10*3/uL (ref 4.0–10.5)

## 2015-10-28 LAB — I-STAT TROPONIN, ED
TROPONIN I, POC: 0 ng/mL (ref 0.00–0.08)
Troponin i, poc: 0 ng/mL (ref 0.00–0.08)

## 2015-10-28 MED ORDER — ASPIRIN EC 325 MG PO TBEC
325.0000 mg | DELAYED_RELEASE_TABLET | Freq: Once | ORAL | Status: AC
  Administered 2015-10-28: 325 mg via ORAL
  Filled 2015-10-28: qty 1

## 2015-10-28 NOTE — Discharge Instructions (Signed)
Nonspecific Chest Pain  °Chest pain can be caused by many different conditions. There is always a chance that your pain could be related to something serious, such as a heart attack or a blood clot in your lungs. Chest pain can also be caused by conditions that are not life-threatening. If you have chest pain, it is very important to follow up with your health care provider. °CAUSES  °Chest pain can be caused by: °· Heartburn. °· Pneumonia or bronchitis. °· Anxiety or stress. °· Inflammation around your heart (pericarditis) or lung (pleuritis or pleurisy). °· A blood clot in your lung. °· A collapsed lung (pneumothorax). It can develop suddenly on its own (spontaneous pneumothorax) or from trauma to the chest. °· Shingles infection (varicella-zoster virus). °· Heart attack. °· Damage to the bones, muscles, and cartilage that make up your chest wall. This can include: °¨ Bruised bones due to injury. °¨ Strained muscles or cartilage due to frequent or repeated coughing or overwork. °¨ Fracture to one or more ribs. °¨ Sore cartilage due to inflammation (costochondritis). °RISK FACTORS  °Risk factors for chest pain may include: °· Activities that increase your risk for trauma or injury to your chest. °· Respiratory infections or conditions that cause frequent coughing. °· Medical conditions or overeating that can cause heartburn. °· Heart disease or family history of heart disease. °· Conditions or health behaviors that increase your risk of developing a blood clot. °· Having had chicken pox (varicella zoster). °SIGNS AND SYMPTOMS °Chest pain can feel like: °· Burning or tingling on the surface of your chest or deep in your chest. °· Crushing, pressure, aching, or squeezing pain. °· Dull or sharp pain that is worse when you move, cough, or take a deep breath. °· Pain that is also felt in your back, neck, shoulder, or arm, or pain that spreads to any of these areas. °Your chest pain may come and go, or it may stay  constant. °DIAGNOSIS °Lab tests or other studies may be needed to find the cause of your pain. Your health care provider may have you take a test called an ambulatory ECG (electrocardiogram). An ECG records your heartbeat patterns at the time the test is performed. You may also have other tests, such as: °· Transthoracic echocardiogram (TTE). During echocardiography, sound waves are used to create a picture of all of the heart structures and to look at how blood flows through your heart. °· Transesophageal echocardiogram (TEE). This is a more advanced imaging test that obtains images from inside your body. It allows your health care provider to see your heart in finer detail. °· Cardiac monitoring. This allows your health care provider to monitor your heart rate and rhythm in real time. °· Holter monitor. This is a portable device that records your heartbeat and can help to diagnose abnormal heartbeats. It allows your health care provider to track your heart activity for several days, if needed. °· Stress tests. These can be done through exercise or by taking medicine that makes your heart beat more quickly. °· Blood tests. °· Imaging tests. °TREATMENT  °Your treatment depends on what is causing your chest pain. Treatment may include: °· Medicines. These may include: °¨ Acid blockers for heartburn. °¨ Anti-inflammatory medicine. °¨ Pain medicine for inflammatory conditions. °¨ Antibiotic medicine, if an infection is present. °¨ Medicines to dissolve blood clots. °¨ Medicines to treat coronary artery disease. °· Supportive care for conditions that do not require medicines. This may include: °¨ Resting. °¨ Applying heat   or cold packs to injured areas. °¨ Limiting activities until pain decreases. °HOME CARE INSTRUCTIONS °· If you were prescribed an antibiotic medicine, finish it all even if you start to feel better. °· Avoid any activities that bring on chest pain. °· Do not use any tobacco products, including  cigarettes, chewing tobacco, or electronic cigarettes. If you need help quitting, ask your health care provider. °· Do not drink alcohol. °· Take medicines only as directed by your health care provider. °· Keep all follow-up visits as directed by your health care provider. This is important. This includes any further testing if your chest pain does not go away. °· If heartburn is the cause for your chest pain, you may be told to keep your head raised (elevated) while sleeping. This reduces the chance that acid will go from your stomach into your esophagus. °· Make lifestyle changes as directed by your health care provider. These may include: °¨ Getting regular exercise. Ask your health care provider to suggest some activities that are safe for you. °¨ Eating a heart-healthy diet. A registered dietitian can help you to learn healthy eating options. °¨ Maintaining a healthy weight. °¨ Managing diabetes, if necessary. °¨ Reducing stress. °SEEK MEDICAL CARE IF: °· Your chest pain does not go away after treatment. °· You have a rash with blisters on your chest. °· You have a fever. °SEEK IMMEDIATE MEDICAL CARE IF:  °· Your chest pain is worse. °· You have an increasing cough, or you cough up blood. °· You have severe abdominal pain. °· You have severe weakness. °· You faint. °· You have chills. °· You have sudden, unexplained chest discomfort. °· You have sudden, unexplained discomfort in your arms, back, neck, or jaw. °· You have shortness of breath at any time. °· You suddenly start to sweat, or your skin gets clammy. °· You feel nauseous or you vomit. °· You suddenly feel light-headed or dizzy. °· Your heart begins to beat quickly, or it feels like it is skipping beats. °These symptoms may represent a serious problem that is an emergency. Do not wait to see if the symptoms will go away. Get medical help right away. Call your local emergency services (911 in the [place] Do not drive yourself to the hospital. °  °This  information is not intended to replace advice given to you by your health care provider. Make sure you discuss any questions you have with your health care provider. °  °Document Released: 07/08/2005 Document Revised: 10/19/2014 Document Reviewed: 05/04/2014 °Elsevier Interactive Patient Education ©2016 [employer] ° °

## 2015-10-28 NOTE — ED Notes (Signed)
Pt states that at approximately 16:30 her had a sharp right-sided chest pain that lasted approx 30 seconds.

## 2015-10-28 NOTE — ED Provider Notes (Signed)
CSN: [id number]     Arrival date & time 10/28/15  1158 History   First MD Initiated Contact with Patient 10/28/15 1339     Chief Complaint  Patient presents with  . Chest Pain     (Consider location/radiation/quality/duration/timing/severity/associated sxs/prior Treatment) HPI Patient states that while he was trying to fix a vacuum cleaner at 11 AM this morning he developed pins and needle sensation to the left chest and the left lower extremity. Became diaphoretic, lightheaded and short of breath. He also complained of to the right temporal region headache. No visual changes. Chest symptoms have since resolved. He's had no new lower extremity swelling or pain. Continues to have episodic right temporal headache but currently denies any pain. Has a previous history of ruptured aneurysm and coiling. Past Medical History  Diagnosis Date  . Stroke (HCC)     2015  . H/O hiatal hernia   . Hypertension     takes Ramipril daily  . Hyperlipidemia     takes Ramipril daily  . Diabetes mellitus without complication (HCC)     takes Metformin daily  . COPD (chronic obstructive pulmonary disease) (HCC) 06/2012    uses Spiriva and Albuterol daily as needed  . Pneumonia     hx of-2014  . History of bronchitis 2014  . Headache(784.0)     "all the time"  . Aneurysm (HCC)     brain  . Umbilical hernia   . Numbness     both arms   . Burning pain     in both legs-seeing Dr.Sethi for this  . Arthritis   . Joint pain   . Joint swelling   . Back pain     reason unknown   . Nocturia   . Chronic kidney disease 2009    chronic kidney disease  . Complication of anesthesia   . Asthma     Last asthma attack at age 79; History of trach at 16 months  . Cataracts, bilateral   . PONV (postoperative nausea and vomiting)     Pt reports nausea only.  . Bipolar disorder (HCC)     was on meds but was taken off 2 yrs ago and none since  . Urinary frequency   . GERD (gastroesophageal reflux disease)      was on Nexium;taking Omeprazole daily as needed   Past Surgical History  Procedure Laterality Date  . Tracheostomy closure    . Tracheostomy      at age 44 months old  . Rectal surgery    . Rotator cuff repair Left 1996  . Radiology with anesthesia N/A 08/08/2014    Procedure: RADIOLOGY WITH ANESTHESIA EMBOLIZATION;  Surgeon: Medication Radiologist, MD;  Location: [facility] OR;  Service: Radiology;  Laterality: N/A;  . Knee arthroscopy Right   . Cyst removed from right wrist    . Angiogram with coiling    . Colonoscopy    . Radiology with anesthesia N/A 11/21/2014    Procedure: RADIOLOGY WITH ANESTHESIA;  Surgeon: Oneal Grout, MD;  Location: [facility] OR;  Service: Radiology;  Laterality: N/A;  . Radiology with anesthesia N/A 02/27/2015    Procedure: RADIOLOGY WITH ANESTHESIA;  Surgeon: [name], MD;  Location: [facility] OR;  Service: Radiology;  Laterality: N/A;  . Rotator cuff repair Right 2016  . Radiology with anesthesia N/A 09/25/2015    Procedure: RADIOLOGY WITH ANESTHESIA;  Surgeon: [name], MD;  Location: [facility] OR;  Service: Radiology;  Laterality: N/A;   Family History  Problem Relation Age of Onset  . Heart disease Mother     s/p 3V CABG  . Cancer Father 7    lung cancer; +tobacco  . Cancer Maternal Grandmother   . Heart disease Maternal Grandmother   . Cancer Paternal Grandmother   . Lupus Sister   . Multiple sclerosis Sister   . Anemia Daughter   . Stroke Father    Social History  Substance Use Topics  . Smoking status: Former Smoker -- 1.00 packs/day    Types: Cigarettes    Start date: 01/15/1975    Quit date: 06/05/2014  . Smokeless tobacco: Never Used     Comment: quit smoking in Aug 2015  . Alcohol Use: 2.4 oz/week    0 Standard drinks or equivalent, 4 Cans of beer per week     Comment: only on Wed    Review of Systems  Constitutional: Negative for fever and chills.  Eyes: Negative for visual disturbance.  Respiratory: Positive for shortness of  breath. Negative for cough.   Cardiovascular: Positive for chest pain. Negative for palpitations and leg swelling.  Gastrointestinal: Positive for nausea. Negative for vomiting, abdominal pain and diarrhea.  Musculoskeletal: Positive for myalgias. Negative for back pain and neck pain.  Skin: Negative for rash and wound.  Neurological: Positive for dizziness, light-headedness, numbness and headaches. Negative for syncope and weakness.  All other systems reviewed and are negative.     Allergies  Tylox and Adhesive  Home Medications   Prior to Admission medications   Medication Sig Start Date End Date Taking? Authorizing Provider  albuterol (PROVENTIL HFA;VENTOLIN HFA) 108 (90 BASE) MCG/ACT inhaler Inhale 2 puffs into the lungs every 6 (six) hours as needed for wheezing or shortness of breath.    Yes Historical Provider, MD  aspirin EC 81 MG tablet Take 81 mg by mouth every other day.   Yes Historical Provider, MD  atorvastatin (LIPITOR) 20 MG tablet Take 1 tablet (20 mg total) by mouth daily at 6 PM. 07/06/14  Yes Calvert Cantor, MD  clopidogrel (PLAVIX) 75 MG tablet Take 0.5 tablets (37.5 mg total) by mouth daily. Patient taking differently: Take 75 mg by mouth daily.  08/14/14  Yes Jeralyn Bennett, MD  esomeprazole (NEXIUM) 40 MG capsule Take 40 mg by mouth at bedtime as needed (for acid reflux).    Yes Historical Provider, MD  gabapentin (NEURONTIN) 100 MG capsule TAKE 1 CAPSULE BY MOUTH THREE TIMES DAILY 08/25/15  Yes Marvel Plan, MD  gabapentin (NEURONTIN) 100 MG capsule Take 100 mg by mouth 2 (two) times daily.   Yes Historical Provider, MD  hydrochlorothiazide (HYDRODIURIL) 25 MG tablet Take 25 mg by mouth daily.  07/12/15  Yes Historical Provider, MD  loratadine (CLARITIN) 10 MG tablet Take 10 mg by mouth daily as needed for allergies.  12/31/14 12/31/15 Yes Historical Provider, MD  metFORMIN (GLUCOPHAGE) 500 MG tablet Take 1 tablet (500 mg total) by mouth 2 (two) times daily with a meal.  07/06/14  Yes Calvert Cantor, MD  ramipril (ALTACE) 5 MG capsule Take 5 mg by mouth daily. 09/03/15  Yes Historical Provider, MD  tiotropium (SPIRIVA HANDIHALER) 18 MCG inhalation capsule Place 1 capsule (18 mcg total) into inhaler and inhale every morning. Patient taking differently: Place 18 mcg into inhaler and inhale as needed (for shortness of breath).  08/14/14  Yes Jeralyn Bennett, MD  triamcinolone cream (KENALOG) 0.1 % Apply 1 application topically 2 (two) times daily.   Yes Historical Provider, MD  nitroGLYCERIN (  NITROSTAT) 0.4 MG SL tablet Place 1 tablet (0.4 mg total) under the tongue every 5 (five) minutes x 3 doses as needed for chest pain. 07/08/14   Orpah CobbAjay Kadakia, MD   BP 115/58 mmHg  Pulse 68  Temp(Src) 97.7 F (36.5 C) (Oral)  Resp 16  SpO2 100% Physical Exam  Constitutional: He is oriented to person, place, and time. He appears well-developed and well-nourished. No distress.  HENT:  Head: Normocephalic and atraumatic.  Mouth/Throat: Oropharynx is clear and moist.  No temporal tenderness to palpation. No evidence of trauma.  Eyes: EOM are normal. Pupils are equal, round, and reactive to light.  Neck: Normal range of motion. Neck supple.  No meningismus  Cardiovascular: Normal rate and regular rhythm.  Exam reveals no gallop and no friction rub.   No murmur heard. Pulmonary/Chest: Effort normal and breath sounds normal. No respiratory distress. He has no wheezes. He has no rales. He exhibits no tenderness.  Abdominal: Soft. Bowel sounds are normal. He exhibits no distension and no mass. There is no tenderness. There is no rebound and no guarding.  Musculoskeletal: Normal range of motion. He exhibits no edema or tenderness.  Distal pulses intact and equal. No calf swelling or tenderness. No midline thoracic or lumbar tenderness. No CVA tenderness.  Neurological: He is alert and oriented to person, place, and time.  Patient is alert and oriented x3 with clear, goal oriented  speech. Patient has 5/5 motor in all extremities. Sensation is intact to light touch. No dysmetria as measured by finger to nose  Skin: Skin is warm and dry. No rash noted. No erythema.  Psychiatric: He has a normal mood and affect. His behavior is normal.  Nursing note and vitals reviewed.   ED Course  Procedures (including critical care time) Labs Review Labs Reviewed  BASIC METABOLIC PANEL - Abnormal; Notable for the following:    Glucose, Bld 175 (*)    All other components within normal limits  CBC  I-STAT TROPOININ, ED  Rosezena SensorI-STAT TROPOININ, ED    Imaging Review Dg Chest 2 View  10/28/2015  CLINICAL DATA:  55 year old male with sudden onset chest pain and lower extremity numbness. EXAM: CHEST  2 VIEW COMPARISON:  Prior chest x-ray 04/01/2015 FINDINGS: The lungs are clear and negative for focal airspace consolidation, pulmonary edema or suspicious pulmonary nodule. No pleural effusion or pneumothorax. Cardiac and mediastinal contours are within normal limits. No acute fracture or lytic or blastic osseous lesions. The visualized upper abdominal bowel gas pattern is unremarkable. IMPRESSION: No active cardiopulmonary disease. Electronically Signed   By: Malachy MoanHeath  McCullough M.D.   On: 10/28/2015 13:18   Ct Head Wo Contrast  10/28/2015  CLINICAL DATA:  55 year old male with left leg numbness and blurry vision. Recent fall on Saturday 10/27/15. Past history of stroke. EXAM: CT HEAD WITHOUT CONTRAST TECHNIQUE: Contiguous axial images were obtained from the base of the skull through the vertex without intravenous contrast. COMPARISON:  Prior head CT 10/16/2015 FINDINGS: Negative for acute intracranial hemorrhage, acute infarction, mass, mass effect, hydrocephalus or midline shift. Gray-white differentiation is preserved throughout. Stable sequelae of remote right MCA territory infarct. Rounded coil pack and metallic stent in the region of the left temporal lobe most consistent with stent assisted coil  embolization of an MCA territory aneurysm. No focal scalp hematoma or contusion. No acute calvarial abnormality. Globes and orbits are intact and symmetric bilaterally. Normal aeration of the mastoid air cells and visualized paranasal sinuses. IMPRESSION: 1. No acute intracranial abnormality.  2. 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 3. Remote right MCA territory infarct. Electronically Signed   By: Malachy Moan M.D.   On: 10/28/2015 16:20   I have personally reviewed and evaluated these images and lab results as part of my medical decision-making.   EKG Interpretation   Date/Time:  Monday October 28 2015 12:01:21 EST Ventricular Rate:  76 PR Interval:  140 QRS Duration: 96 QT Interval:  404 QTC Calculation: 454 R Axis:   -18 Text Interpretation:  Normal sinus rhythm Incomplete right bundle branch  block Moderate voltage criteria for LVH, may be normal variant T wave  abnormality, consider lateral ischemia Abnormal ECG Confirmed by Ranae Palms   MD, Autry Prust ([id number]) on 10/28/2015 2:47:24 PM      MDM   Final diagnoses:  Chest pain, unspecified chest pain type    Patient with atypical left-sided chest paresthesias. This would be an abnormal presentation for coronary artery disease. CT is pending to rule out acute hemorrhage. Normal neurologic exam and lack of symptoms currently.  Signed out to oncoming emergency physician pending second troponin.  Loren Racer, MD 10/29/15 [phone]

## 2015-10-28 NOTE — ED Notes (Signed)
Spoke to MD about patient.  Requesting NPO at this time.

## 2015-10-28 NOTE — ED Notes (Signed)
Patient able to dress and ambulate independently 

## 2015-10-28 NOTE — ED Notes (Signed)
Pt sts that about 30 minutes ago he was fixing a vacuum cleaner and began having chest pain, SOB, diaphoresis and tingling in arms.

## 2015-10-29 NOTE — ED Provider Notes (Signed)
  Physical Exam  BP 115/58 mmHg  Pulse 68  Temp(Src) 97.7 F (36.5 C) (Oral)  Resp 16  SpO2 100%  Physical Exam  ED Course  Procedures  MDM Second troponin negative. Will discharge home. Doubt cardiac cause.      Benjiman Core, MD 10/29/15 [phone]

## 2015-10-30 ENCOUNTER — Telehealth (HOSPITAL_COMMUNITY): Payer: Self-pay | Admitting: *Deleted

## 2015-10-30 NOTE — Telephone Encounter (Signed)
Called and spoke with [name].  Per Dr. [name] instruction pt to take Plavix 37.5mg  every other day and ASA  every other day.  Instructed him to alternate so he take 1 one day and the other the next day.

## 2015-12-23 DIAGNOSIS — Z0000 Encounter for general adult medical examination without abnormal findings: Secondary | ICD-10-CM | POA: Insufficient documentation

## 2016-01-20 ENCOUNTER — Ambulatory Visit: Payer: Medicaid Other | Admitting: Neurology

## 2016-02-02 ENCOUNTER — Encounter (HOSPITAL_COMMUNITY): Payer: Self-pay | Admitting: Emergency Medicine

## 2016-02-02 ENCOUNTER — Emergency Department (HOSPITAL_COMMUNITY): Payer: Medicaid Other

## 2016-02-02 ENCOUNTER — Emergency Department (HOSPITAL_COMMUNITY)
Admission: EM | Admit: 2016-02-02 | Discharge: 2016-02-02 | Disposition: A | Discharge status: Home / Self Care | Payer: Medicaid Other | Attending: Emergency Medicine | Admitting: Emergency Medicine

## 2016-02-02 DIAGNOSIS — Z8701 Personal history of pneumonia (recurrent): Secondary | ICD-10-CM | POA: Insufficient documentation

## 2016-02-02 DIAGNOSIS — S99922A Unspecified injury of left foot, initial encounter: Secondary | ICD-10-CM | POA: Insufficient documentation

## 2016-02-02 DIAGNOSIS — E785 Hyperlipidemia, unspecified: Secondary | ICD-10-CM | POA: Diagnosis not present

## 2016-02-02 DIAGNOSIS — W228XXA Striking against or struck by other objects, initial encounter: Secondary | ICD-10-CM | POA: Diagnosis not present

## 2016-02-02 DIAGNOSIS — F319 Bipolar disorder, unspecified: Secondary | ICD-10-CM | POA: Insufficient documentation

## 2016-02-02 DIAGNOSIS — Z7984 Long term (current) use of oral hypoglycemic drugs: Secondary | ICD-10-CM | POA: Insufficient documentation

## 2016-02-02 DIAGNOSIS — Y998 Other external cause status: Secondary | ICD-10-CM | POA: Diagnosis not present

## 2016-02-02 DIAGNOSIS — Y9289 Other specified places as the place of occurrence of the external cause: Secondary | ICD-10-CM | POA: Insufficient documentation

## 2016-02-02 DIAGNOSIS — J449 Chronic obstructive pulmonary disease, unspecified: Secondary | ICD-10-CM | POA: Diagnosis not present

## 2016-02-02 DIAGNOSIS — H269 Unspecified cataract: Secondary | ICD-10-CM | POA: Diagnosis not present

## 2016-02-02 DIAGNOSIS — Y9389 Activity, other specified: Secondary | ICD-10-CM | POA: Diagnosis not present

## 2016-02-02 DIAGNOSIS — S99912A Unspecified injury of left ankle, initial encounter: Secondary | ICD-10-CM | POA: Insufficient documentation

## 2016-02-02 DIAGNOSIS — Z79899 Other long term (current) drug therapy: Secondary | ICD-10-CM | POA: Insufficient documentation

## 2016-02-02 DIAGNOSIS — Z87891 Personal history of nicotine dependence: Secondary | ICD-10-CM | POA: Insufficient documentation

## 2016-02-02 DIAGNOSIS — N189 Chronic kidney disease, unspecified: Secondary | ICD-10-CM | POA: Insufficient documentation

## 2016-02-02 DIAGNOSIS — M7918 Myalgia, other site: Secondary | ICD-10-CM

## 2016-02-02 DIAGNOSIS — K219 Gastro-esophageal reflux disease without esophagitis: Secondary | ICD-10-CM | POA: Diagnosis not present

## 2016-02-02 DIAGNOSIS — M79676 Pain in unspecified toe(s): Secondary | ICD-10-CM

## 2016-02-02 DIAGNOSIS — Z7952 Long term (current) use of systemic steroids: Secondary | ICD-10-CM | POA: Insufficient documentation

## 2016-02-02 DIAGNOSIS — Z8673 Personal history of transient ischemic attack (TIA), and cerebral infarction without residual deficits: Secondary | ICD-10-CM | POA: Diagnosis not present

## 2016-02-02 DIAGNOSIS — M1990 Unspecified osteoarthritis, unspecified site: Secondary | ICD-10-CM | POA: Diagnosis not present

## 2016-02-02 DIAGNOSIS — E119 Type 2 diabetes mellitus without complications: Secondary | ICD-10-CM | POA: Diagnosis not present

## 2016-02-02 DIAGNOSIS — I129 Hypertensive chronic kidney disease with stage 1 through stage 4 chronic kidney disease, or unspecified chronic kidney disease: Secondary | ICD-10-CM | POA: Diagnosis not present

## 2016-02-02 DIAGNOSIS — Z7982 Long term (current) use of aspirin: Secondary | ICD-10-CM | POA: Insufficient documentation

## 2016-02-02 NOTE — ED Provider Notes (Signed)
CSN: [id number]     Arrival date & time 02/02/16  2125 History  By signing my name below, I, Doreatha Martin, attest that this documentation has been prepared under the direction and in the presence of Audry Pili, PA-C. Electronically Signed: Doreatha Martin, ED Scribe. 02/02/2016. 9:57 PM.    Chief Complaint  Patient presents with  . Foot Injury   The history is provided by the patient. No language interpreter was used.   HPI Comments: [name] is a 55 y.o. male who presents to the Emergency Department complaining of moderate 6/10 (at rest) left ankle pain, 5th toe pain s/p injury that occurred at 4am. Pt states that he accidentally ran into a door jamb with his 5th toe, everting ankle and causing his injury. Pt is ambulatory with minimal difficulty. Pt denies taking OTC medications at home to improve symptoms. He is able to flex and extend the toe and ankle with minimal difficulty. Pain is worsened with movement, ambulation and weight-bearing. Denies numbness, additional injuries.   Past Medical History  Diagnosis Date  . Stroke (HCC)     2015  . H/O hiatal hernia   . Hypertension     takes Ramipril daily  . Hyperlipidemia     takes Ramipril daily  . Diabetes mellitus without complication (HCC)     takes Metformin daily  . COPD (chronic obstructive pulmonary disease) (HCC) 06/2012    uses Spiriva and Albuterol daily as needed  . Pneumonia     hx of-2014  . History of bronchitis 2014  . Headache(784.0)     "all the time"  . Aneurysm (HCC)     brain  . Umbilical hernia   . Numbness     both arms   . Burning pain     in both legs-seeing Dr.Sethi for this  . Arthritis   . Joint pain   . Joint swelling   . Back pain     reason unknown   . Nocturia   . Chronic kidney disease 2009    chronic kidney disease  . Complication of anesthesia   . Asthma     Last asthma attack at age 12; History of trach at 16 months  . Cataracts, bilateral   . PONV (postoperative nausea and vomiting)      Pt reports nausea only.  . Bipolar disorder (HCC)     was on meds but was taken off 2 yrs ago and none since  . Urinary frequency   . GERD (gastroesophageal reflux disease)     was on Nexium;taking Omeprazole daily as needed   Past Surgical History  Procedure Laterality Date  . Tracheostomy closure    . Tracheostomy      at age 35 months old  . Rectal surgery    . Rotator cuff repair Left 1996  . Radiology with anesthesia N/A 08/08/2014    Procedure: RADIOLOGY WITH ANESTHESIA EMBOLIZATION;  Surgeon: Medication Radiologist, MD;  Location: [facility] OR;  Service: Radiology;  Laterality: N/A;  . Knee arthroscopy Right   . Cyst removed from right wrist    . Angiogram with coiling    . Colonoscopy    . Radiology with anesthesia N/A 11/21/2014    Procedure: RADIOLOGY WITH ANESTHESIA;  Surgeon: Oneal Grout, MD;  Location: [facility] OR;  Service: Radiology;  Laterality: N/A;  . Radiology with anesthesia N/A 02/27/2015    Procedure: RADIOLOGY WITH ANESTHESIA;  Surgeon: [name], MD;  Location: [facility] OR;  Service: Radiology;  Laterality: N/A;  . Rotator cuff repair Right 2016  . Radiology with anesthesia N/A 09/25/2015    Procedure: RADIOLOGY WITH ANESTHESIA;  Surgeon: [name], MD;  Location: [facility] OR;  Service: Radiology;  Laterality: N/A;   Family History  Problem Relation Age of Onset  . Heart disease Mother     s/p 3V CABG  . Cancer Father 68    lung cancer; +tobacco  . Cancer Maternal Grandmother   . Heart disease Maternal Grandmother   . Cancer Paternal Grandmother   . Lupus Sister   . Multiple sclerosis Sister   . Anemia Daughter   . Stroke Father    Social History  Substance Use Topics  . Smoking status: Former Smoker -- 0.00 packs/day    Types: Cigarettes    Start date: 01/15/1975    Quit date: 06/05/2014  . Smokeless tobacco: Never Used     Comment: quit smoking in Aug 2015  . Alcohol Use: 0.0 oz/week    0 Standard drinks or equivalent per week     Review of Systems A complete 10 system review of systems was obtained and all systems are negative except as noted in the HPI and PMH.    Allergies  Tylox and Adhesive  Home Medications   Prior to Admission medications   Medication Sig Start Date End Date Taking? Authorizing Provider  albuterol (PROVENTIL HFA;VENTOLIN HFA) 108 (90 BASE) MCG/ACT inhaler Inhale 2 puffs into the lungs every 6 (six) hours as needed for wheezing or shortness of breath.     Historical Provider, MD  aspirin EC 81 MG tablet Take 81 mg by mouth every other day.    Historical Provider, MD  atorvastatin (LIPITOR) 20 MG tablet Take 1 tablet (20 mg total) by mouth daily at 6 PM. 07/06/14   Calvert Cantor, MD  clopidogrel (PLAVIX) 75 MG tablet Take 0.5 tablets (37.5 mg total) by mouth daily. Patient taking differently: Take 75 mg by mouth daily.  08/14/14   Jeralyn Bennett, MD  esomeprazole (NEXIUM) 40 MG capsule Take 40 mg by mouth at bedtime as needed (for acid reflux).     Historical Provider, MD  gabapentin (NEURONTIN) 100 MG capsule TAKE 1 CAPSULE BY MOUTH THREE TIMES DAILY 08/25/15   Marvel Plan, MD  gabapentin (NEURONTIN) 100 MG capsule Take 100 mg by mouth 2 (two) times daily.    Historical Provider, MD  hydrochlorothiazide (HYDRODIURIL) 25 MG tablet Take 25 mg by mouth daily.  07/12/15   Historical Provider, MD  loratadine (CLARITIN) 10 MG tablet Take 10 mg by mouth daily as needed for allergies.  12/31/14 12/31/15  Historical Provider, MD  metFORMIN (GLUCOPHAGE) 500 MG tablet Take 1 tablet (500 mg total) by mouth 2 (two) times daily with a meal. 07/06/14   Calvert Cantor, MD  nitroGLYCERIN (NITROSTAT) 0.4 MG SL tablet Place 1 tablet (0.4 mg total) under the tongue every 5 (five) minutes x 3 doses as needed for chest pain. 07/08/14   Orpah Cobb, MD  ramipril (ALTACE) 5 MG capsule Take 5 mg by mouth daily. 09/03/15   Historical Provider, MD  tiotropium (SPIRIVA HANDIHALER) 18 MCG inhalation capsule Place 1 capsule (18  mcg total) into inhaler and inhale every morning. Patient taking differently: Place 18 mcg into inhaler and inhale as needed (for shortness of breath).  08/14/14   Jeralyn Bennett, MD  triamcinolone cream (KENALOG) 0.1 % Apply 1 application topically 2 (two) times daily.    Historical Provider, MD   BP 122/71 mmHg  Pulse 83  Temp(Src) 98.6 F (37 C) (Oral)  Resp 16  Ht 5\' 8"  (1.727 m)  Wt 244 lb (110.678 kg)  BMI 37.11 kg/m2  SpO2 95%   Physical Exam  Constitutional: He is oriented to person, place, and time. He appears well-developed and well-nourished.  HENT:  Head: Normocephalic.  Eyes: Conjunctivae are normal.  Cardiovascular: Normal rate.   Pulmonary/Chest: Effort normal. No respiratory distress.  Abdominal: He exhibits no distension.  Musculoskeletal: Normal range of motion. He exhibits tenderness. He exhibits no edema.  Tenderness along the 5th digit of the left foot. Tender along the medial malleolus. FROM. NVI. DP pulse 2+. Toenail intact. No obvious deformity or edema. No joint effusion. Able to plantar and dorsiflex without pain. Able to flex and extend toes. Ambulatory with steady gait. Capillary refill less than 3 seconds. Sensation equal and intact bilaterally.      Neurological: He is alert and oriented to person, place, and time.  Skin: Skin is warm and dry.  Psychiatric: He has a normal mood and affect. His behavior is normal.  Nursing note and vitals reviewed.   ED Course  Procedures (including critical care time) DIAGNOSTIC STUDIES: Oxygen Saturation is 95% on RA, adequate by my interpretation.    COORDINATION OF CARE: 9:55 PM Discussed treatment plan with pt at bedside which includes XR and pt agreed to plan.   Imaging Review Dg Toe 5th Left  02/02/2016  CLINICAL DATA:  Stubbed left fifth toe into wall, with left fifth toe pain. Initial encounter. EXAM: DG TOE 5TH LEFT COMPARISON:  None. FINDINGS: There is no evidence of fracture or dislocation. The left  fifth toe appears grossly intact. Visualized joint spaces are preserved. No definite soft tissue abnormalities are characterized on radiograph. IMPRESSION: No evidence of fracture or dislocation. Electronically Signed   By: Roanna RaiderJeffery  Chang M.D.   On: 02/02/2016 23:08   I have personally reviewed and evaluated these images as part of my medical decision-making.   MDM  I have reviewed and evaluated the relevant imaging studies. I have reviewed the relevant previous healthcare records. I obtained HPI from historian.  ED Course:  Assessment: Patient X-Ray negative for obvious fracture or dislocation. Likely Sprain. Pt advised to follow up with PCP in 1 week. Patient given post-op shoe while in ED, conservative therapy recommended and discussed. Patient will be discharged home & is agreeable with above plan. Returns precautions discussed. Pt appears safe for discharge.   Disposition/Plan:  DC home Additional Verbal discharge instructions given and discussed with patient.  Pt Instructed to f/u with PCP in the next week for evaluation and treatment of symptoms. Return precautions given Pt acknowledges and agrees with plan  Supervising Physician Rolland PorterMark James, MD   Final diagnoses:  Toe pain  Musculoskeletal pain    I personally performed the services described in this documentation, which was scribed in my presence. The recorded information has been reviewed and is accurate.   Audry Piliyler Jerome Viglione, PA-C 02/02/16 2316  Rolland PorterMark James, MD 02/15/16 Serena [id number]

## 2016-02-02 NOTE — Discharge Instructions (Signed)
Please read and follow all provided instructions.  Your diagnoses today include:  1. Musculoskeletal pain   2. Toe pain    Tests performed today include:  Vital signs. See below for your results today.   Medications prescribed:   None  Home care instructions:  Follow any educational materials contained in this packet.  Follow-up instructions: Please follow-up with your primary care provider in the next week for further evaluation of symptoms and treatment   Return instructions:   Please return to the Emergency Department if you do not get better, if you get worse, or new symptoms OR  - Fever (temperature greater than 101.56F)  - Bleeding that does not stop with holding pressure to the area    -Severe pain (please note that you may be more sore the day after your accident)  - Chest Pain  - Difficulty breathing  - Severe nausea or vomiting  - Inability to tolerate food and liquids  - Passing out  - Skin becoming red around your wounds  - Change in mental status (confusion or lethargy)  - New numbness or weakness     Please return if you have any other emergent concerns.  Additional Information:  Your vital signs today were: BP 122/71 mmHg   Pulse 83   Temp(Src) 98.6 F (37 C) (Oral)   Resp 16   Ht 5\' 8"  (1.727 m)   Wt 110.678 kg   BMI 37.11 kg/m2   SpO2 95% If your blood pressure (BP) was elevated above 135/85 this visit, please have this repeated by your doctor within one month. ---------------

## 2016-02-02 NOTE — ED Notes (Signed)
Pt. accidentally hit his left foot against the door jamb this morning , reports pain at left 5th toe and left ankle .

## 2016-02-02 NOTE — ED Notes (Signed)
Patient transported to X-ray 

## 2016-03-04 ENCOUNTER — Other Ambulatory Visit (HOSPITAL_COMMUNITY): Payer: Self-pay | Admitting: Interventional Radiology

## 2016-03-04 DIAGNOSIS — I771 Stricture of artery: Secondary | ICD-10-CM

## 2016-03-04 DIAGNOSIS — I671 Cerebral aneurysm, nonruptured: Secondary | ICD-10-CM

## 2016-03-23 ENCOUNTER — Other Ambulatory Visit: Payer: Self-pay | Admitting: Radiology

## 2016-03-23 ENCOUNTER — Ambulatory Visit (HOSPITAL_COMMUNITY)
Admission: RE | Admit: 2016-03-23 | Discharge: 2016-03-23 | Disposition: A | Discharge status: Home / Self Care | Payer: Medicaid Other | Source: Ambulatory Visit | Attending: Interventional Radiology | Admitting: Interventional Radiology

## 2016-03-23 DIAGNOSIS — I771 Stricture of artery: Secondary | ICD-10-CM | POA: Diagnosis not present

## 2016-03-23 DIAGNOSIS — I671 Cerebral aneurysm, nonruptured: Secondary | ICD-10-CM | POA: Diagnosis present

## 2016-03-23 LAB — PLATELET INHIBITION P2Y12: Platelet Function  P2Y12: 103 [PRU] — ABNORMAL LOW (ref 194–418)

## 2016-03-30 ENCOUNTER — Other Ambulatory Visit: Payer: Self-pay | Admitting: Cardiology

## 2016-03-30 DIAGNOSIS — R079 Chest pain, unspecified: Secondary | ICD-10-CM

## 2016-04-03 ENCOUNTER — Encounter (HOSPITAL_COMMUNITY)
Admission: RE | Admit: 2016-04-03 | Discharge: 2016-04-03 | Disposition: A | Discharge status: Home / Self Care | Payer: Medicaid Other | Source: Ambulatory Visit | Attending: Cardiology | Admitting: Cardiology

## 2016-04-03 DIAGNOSIS — R079 Chest pain, unspecified: Secondary | ICD-10-CM | POA: Diagnosis present

## 2016-04-03 MED ORDER — REGADENOSON 0.4 MG/5ML IV SOLN
0.4000 mg | Freq: Once | INTRAVENOUS | Status: AC
  Administered 2016-04-03: 0.4 mg via INTRAVENOUS

## 2016-04-03 MED ORDER — REGADENOSON 0.4 MG/5ML IV SOLN
INTRAVENOUS | Status: AC
  Filled 2016-04-03: qty 5

## 2016-04-03 MED ORDER — TECHNETIUM TC 99M TETROFOSMIN IV KIT
10.0000 | PACK | Freq: Once | INTRAVENOUS | Status: AC | PRN
  Administered 2016-04-03: 10 via INTRAVENOUS

## 2016-04-03 MED ORDER — TECHNETIUM TC 99M TETROFOSMIN IV KIT
30.0000 | PACK | Freq: Once | INTRAVENOUS | Status: AC | PRN
  Administered 2016-04-03: 30 via INTRAVENOUS

## 2016-04-09 ENCOUNTER — Telehealth (HOSPITAL_COMMUNITY): Payer: Self-pay | Admitting: *Deleted

## 2016-04-09 NOTE — Telephone Encounter (Signed)
Called and spoke with patient. Per Dr. Corliss Skainseveshwar instruction patient is to continue Plavix 75mg  daily and ASA 81mg  daily.  Pt verbalized understanding

## 2016-04-13 ENCOUNTER — Other Ambulatory Visit (HOSPITAL_COMMUNITY): Payer: Self-pay | Admitting: Interventional Radiology

## 2016-04-13 DIAGNOSIS — I729 Aneurysm of unspecified site: Secondary | ICD-10-CM

## 2016-04-13 DIAGNOSIS — I639 Cerebral infarction, unspecified: Secondary | ICD-10-CM

## 2016-04-15 ENCOUNTER — Ambulatory Visit (HOSPITAL_COMMUNITY)
Admission: RE | Admit: 2016-04-15 | Discharge: 2016-04-15 | Disposition: A | Discharge status: Home / Self Care | Payer: Medicaid Other | Source: Ambulatory Visit | Attending: Interventional Radiology | Admitting: Interventional Radiology

## 2016-04-15 DIAGNOSIS — N189 Chronic kidney disease, unspecified: Secondary | ICD-10-CM | POA: Insufficient documentation

## 2016-04-15 DIAGNOSIS — J449 Chronic obstructive pulmonary disease, unspecified: Secondary | ICD-10-CM | POA: Insufficient documentation

## 2016-04-15 DIAGNOSIS — Z7984 Long term (current) use of oral hypoglycemic drugs: Secondary | ICD-10-CM | POA: Insufficient documentation

## 2016-04-15 DIAGNOSIS — I639 Cerebral infarction, unspecified: Secondary | ICD-10-CM | POA: Diagnosis not present

## 2016-04-15 DIAGNOSIS — I129 Hypertensive chronic kidney disease with stage 1 through stage 4 chronic kidney disease, or unspecified chronic kidney disease: Secondary | ICD-10-CM | POA: Diagnosis not present

## 2016-04-15 DIAGNOSIS — F319 Bipolar disorder, unspecified: Secondary | ICD-10-CM | POA: Diagnosis not present

## 2016-04-15 DIAGNOSIS — E1122 Type 2 diabetes mellitus with diabetic chronic kidney disease: Secondary | ICD-10-CM | POA: Insufficient documentation

## 2016-04-15 DIAGNOSIS — K219 Gastro-esophageal reflux disease without esophagitis: Secondary | ICD-10-CM | POA: Insufficient documentation

## 2016-04-15 DIAGNOSIS — E785 Hyperlipidemia, unspecified: Secondary | ICD-10-CM | POA: Diagnosis not present

## 2016-04-15 DIAGNOSIS — I729 Aneurysm of unspecified site: Secondary | ICD-10-CM | POA: Insufficient documentation

## 2016-04-15 LAB — VAS US CAROTID
LCCADDIAS: -15 cm/s
LEFT ECA DIAS: -16 cm/s
LEFT VERTEBRAL DIAS: -21 cm/s
LICADDIAS: -34 cm/s
LICADSYS: -92 cm/s
LICAPDIAS: -73 cm/s
LICAPSYS: -154 cm/s
Left CCA dist sys: -75 cm/s
Left CCA prox dias: 31 cm/s
Left CCA prox sys: 131 cm/s
RIGHT ECA DIAS: -13 cm/s
RIGHT VERTEBRAL DIAS: -13 cm/s
Right CCA prox dias: 29 cm/s
Right CCA prox sys: 94 cm/s
Right cca dist sys: -85 cm/s

## 2016-04-15 NOTE — Progress Notes (Signed)
Preliminary results by tech -  Carotid Duplex Completed. Right ICA stent patent with no evidence of significant stenosis. Left ICA demonstrated a 40-59% stenosis. Marilynne Halstedita Neena Beecham, BS, RDMS, RVT

## 2016-04-21 ENCOUNTER — Telehealth (HOSPITAL_COMMUNITY): Payer: Self-pay

## 2016-04-21 NOTE — Telephone Encounter (Signed)
Pt agreed to f/u in 6 months with an ultrasound carotid. AW

## 2016-07-23 ENCOUNTER — Ambulatory Visit: Payer: Medicaid Other | Admitting: Neurology

## 2016-09-07 ENCOUNTER — Telehealth (HOSPITAL_COMMUNITY): Payer: Self-pay

## 2016-09-07 NOTE — Telephone Encounter (Signed)
Pt's mother agreed to have pt f/u with pcp about head injury. AW

## 2016-09-07 NOTE — Telephone Encounter (Signed)
Pt's mother called to inform Dr. Corliss Skainseveshwar that pt fell and hit his head. When he moves his eyes from right to left it is very painful. He also has a sharp pain right where his temple is. He was told to call if he ever hits his head. Pt's mother is wondering if pt may need a MRI done? Will go talk to Dr. Corliss Skainseveshwar and see what he advises. Pt's mother agreed. AW

## 2016-09-11 ENCOUNTER — Telehealth: Payer: Self-pay | Admitting: Rheumatology

## 2016-09-11 NOTE — Telephone Encounter (Signed)
Dr. Bonney LeitzBouska's office called to verify MRA orders on patient? Please call them back [phone]

## 2016-09-11 NOTE — Telephone Encounter (Signed)
Unfortunately they have called the incorrect Dr Corliss Skainseveshwar I have called them to advise

## 2016-09-21 ENCOUNTER — Encounter (HOSPITAL_COMMUNITY): Payer: Self-pay

## 2016-09-21 ENCOUNTER — Emergency Department (HOSPITAL_COMMUNITY): Payer: Medicaid Other

## 2016-09-21 ENCOUNTER — Emergency Department (HOSPITAL_COMMUNITY)
Admission: EM | Admit: 2016-09-21 | Discharge: 2016-09-21 | Disposition: A | Discharge status: Home / Self Care | Payer: Medicaid Other | Attending: Emergency Medicine | Admitting: Emergency Medicine

## 2016-09-21 DIAGNOSIS — Y999 Unspecified external cause status: Secondary | ICD-10-CM | POA: Insufficient documentation

## 2016-09-21 DIAGNOSIS — Z7984 Long term (current) use of oral hypoglycemic drugs: Secondary | ICD-10-CM | POA: Diagnosis not present

## 2016-09-21 DIAGNOSIS — E1122 Type 2 diabetes mellitus with diabetic chronic kidney disease: Secondary | ICD-10-CM | POA: Insufficient documentation

## 2016-09-21 DIAGNOSIS — N189 Chronic kidney disease, unspecified: Secondary | ICD-10-CM | POA: Diagnosis not present

## 2016-09-21 DIAGNOSIS — S0990XA Unspecified injury of head, initial encounter: Secondary | ICD-10-CM

## 2016-09-21 DIAGNOSIS — Y929 Unspecified place or not applicable: Secondary | ICD-10-CM | POA: Diagnosis not present

## 2016-09-21 DIAGNOSIS — J449 Chronic obstructive pulmonary disease, unspecified: Secondary | ICD-10-CM | POA: Insufficient documentation

## 2016-09-21 DIAGNOSIS — I129 Hypertensive chronic kidney disease with stage 1 through stage 4 chronic kidney disease, or unspecified chronic kidney disease: Secondary | ICD-10-CM | POA: Insufficient documentation

## 2016-09-21 DIAGNOSIS — Y9389 Activity, other specified: Secondary | ICD-10-CM | POA: Insufficient documentation

## 2016-09-21 DIAGNOSIS — Z7982 Long term (current) use of aspirin: Secondary | ICD-10-CM | POA: Insufficient documentation

## 2016-09-21 DIAGNOSIS — Z8673 Personal history of transient ischemic attack (TIA), and cerebral infarction without residual deficits: Secondary | ICD-10-CM | POA: Insufficient documentation

## 2016-09-21 DIAGNOSIS — Z87891 Personal history of nicotine dependence: Secondary | ICD-10-CM | POA: Insufficient documentation

## 2016-09-21 DIAGNOSIS — W228XXA Striking against or struck by other objects, initial encounter: Secondary | ICD-10-CM | POA: Diagnosis not present

## 2016-09-21 LAB — BASIC METABOLIC PANEL
ANION GAP: 9 (ref 5–15)
BUN: 11 mg/dL (ref 6–20)
CALCIUM: 9.1 mg/dL (ref 8.9–10.3)
CO2: 26 mmol/L (ref 22–32)
Chloride: 101 mmol/L (ref 101–111)
Creatinine, Ser: 0.76 mg/dL (ref 0.61–1.24)
GFR calc Af Amer: >60 mL/min (ref >60)
GLUCOSE: 89 mg/dL (ref 65–99)
Potassium: 4 mmol/L (ref 3.5–5.1)
SODIUM: 136 mmol/L (ref 135–145)

## 2016-09-21 LAB — CBC WITH DIFFERENTIAL/PLATELET
BASOS ABS: 0 10*3/uL (ref 0.0–0.1)
BASOS PCT: 0 %
EOS PCT: 3 %
Eosinophils Absolute: 0.2 10*3/uL (ref 0.0–0.7)
HEMATOCRIT: 40.3 % (ref 39.0–52.0)
Hemoglobin: 13.7 g/dL (ref 13.0–17.0)
Lymphocytes Relative: 37 %
Lymphs Abs: 2.2 10*3/uL (ref 0.7–4.0)
MCH: 29.5 pg (ref 26.0–34.0)
MCHC: 34 g/dL (ref 30.0–36.0)
MCV: 86.9 fL (ref 78.0–100.0)
MONO ABS: 0.6 10*3/uL (ref 0.1–1.0)
Monocytes Relative: 9 %
NEUTROS ABS: 3 10*3/uL (ref 1.7–7.7)
Neutrophils Relative %: 51 %
PLATELETS: 199 10*3/uL (ref 150–400)
RBC: 4.64 MIL/uL (ref 4.22–5.81)
RDW: 12.4 % (ref 11.5–15.5)
WBC: 6 10*3/uL (ref 4.0–10.5)

## 2016-09-21 NOTE — ED Provider Notes (Signed)
[facility]-EMERGENCY DEPT Provider Note   CSN: [id number] Arrival date & time: 09/21/16  0932     History   Chief Complaint Chief Complaint  Patient presents with  . Eye Pain  . Headache    HPI [name] is a 55 y.o. male.  Patient is a 55 year old male with extensive past medical history including COPD, GERD, diabetes, hypertension, and prior brain aneurysm. He reports he has "stents" in his brain and currently takes Plavix. He presents today for evaluation of a head injury. He reports moving a ladder with an acquaintance when he banged his head on the latter. He denies any loss of consciousness, but does report headache and blurry vision for the past 3 days. His blurry vision is mainly out of his left eye and when he attempts to look across his nose. He denies any neck pain.   The history is provided by the patient.  Head Injury   Incident onset: 3 days ago. He came to the ER via walk-in. The injury mechanism was a direct blow. There was no loss of consciousness. There was no blood loss. The quality of the pain is described as dull. The pain is moderate. The pain has been intermittent since the injury. Associated symptoms include blurred vision. Pertinent negatives include no numbness, no vomiting and no weakness. He has tried nothing for the symptoms.    Past Medical History:  Diagnosis Date  . Aneurysm (HCC)    brain  . Arthritis   . Asthma    Last asthma attack at age 47; History of trach at 16 months  . Back pain    reason unknown   . Bipolar disorder (HCC)    was on meds but was taken off 2 yrs ago and none since  . Burning pain    in both legs-seeing Dr.Sethi for this  . Cataracts, bilateral   . Chronic kidney disease 2009   chronic kidney disease  . Complication of anesthesia   . COPD (chronic obstructive pulmonary disease) (HCC) 06/2012   uses Spiriva and Albuterol daily as needed  . Diabetes mellitus without complication (HCC)    takes Metformin daily  . GERD  (gastroesophageal reflux disease)    was on Nexium;taking Omeprazole daily as needed  . H/O hiatal hernia   . Headache(784.0)    "all the time"  . History of bronchitis 2014  . Hyperlipidemia    takes Ramipril daily  . Hypertension    takes Ramipril daily  . Joint pain   . Joint swelling   . Nocturia   . Numbness    both arms   . Pneumonia    hx of-2014  . PONV (postoperative nausea and vomiting)    Pt reports nausea only.  . Stroke (HCC)    2015  . Umbilical hernia   . Urinary frequency     Patient Active Problem List   Diagnosis Date Noted  . Carotid stenosis, symptomatic w/o infarct 09/25/2015  . Paresthesia and pain of right extremity 12/04/2014  . Carotid stenosis 12/04/2014  . Stroke, hemorrhagic (HCC) 09/01/2014  . 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 TIA (transient ischemic attack) 08/14/2014  . Right arm weakness   . Cerebral hemorrhage (HCC) 08/11/2014  . ICH (intracerebral hemorrhage) (HCC) 08/11/2014  . Brain aneurysm 08/08/2014  . Chest pain at rest 07/08/2014  . right ICAO (internal carotid artery occlusion) 07/06/2014  . 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  Hyperlipidemia LDL goal <100 07/06/2014  . Morbid obesity (HCC) 07/06/2014  . Acute CVA (cerebrovascular accident) (HCC) 07/03/2014  . GERD (gastroesophageal reflux disease) 10/19/2012  . COPD (chronic obstructive pulmonary disease) (HCC) 06/12/2012    Past Surgical History:  Procedure Laterality Date  . angiogram with coiling    . COLONOSCOPY    . cyst removed from right wrist    . KNEE ARTHROSCOPY Right   . RADIOLOGY WITH ANESTHESIA N/A 08/08/2014   Procedure: RADIOLOGY WITH ANESTHESIA EMBOLIZATION;  Surgeon: Medication Radiologist, MD;  Location: [facility] OR;  Service: Radiology;  Laterality: N/A;  . RADIOLOGY WITH ANESTHESIA N/A 11/21/2014   Procedure: RADIOLOGY WITH ANESTHESIA;  Surgeon: Oneal GroutSanjeev K Deveshwar, MD;  Location: [facility] OR;   Service: Radiology;  Laterality: N/A;  . RADIOLOGY WITH ANESTHESIA N/A 02/27/2015   Procedure: RADIOLOGY WITH ANESTHESIA;  Surgeon: Julieanne CottonSanjeev Deveshwar, MD;  Location: [facility] OR;  Service: Radiology;  Laterality: N/A;  . RADIOLOGY WITH ANESTHESIA N/A 09/25/2015   Procedure: RADIOLOGY WITH ANESTHESIA;  Surgeon: Julieanne CottonSanjeev Deveshwar, MD;  Location: [facility] OR;  Service: Radiology;  Laterality: N/A;  . RECTAL SURGERY    . ROTATOR CUFF REPAIR Left 1996  . ROTATOR CUFF REPAIR Right 2016  . TRACHEOSTOMY     at age 54 months old  . TRACHEOSTOMY CLOSURE         Home Medications    Prior to Admission medications   Medication Sig Start Date End Date Taking? Authorizing Provider  albuterol (PROVENTIL HFA;VENTOLIN HFA) 108 (90 BASE) MCG/ACT inhaler Inhale 2 puffs into the lungs every 6 (six) hours as needed for wheezing or shortness of breath.     Historical Provider, MD  aspirin EC 81 MG tablet Take 81 mg by mouth every other day.    Historical Provider, MD  atorvastatin (LIPITOR) 20 MG tablet Take 1 tablet (20 mg total) by mouth daily at 6 PM. 07/06/14   Calvert CantorSaima Rizwan, MD  clopidogrel (PLAVIX) 75 MG tablet Take 0.5 tablets (37.5 mg total) by mouth daily. Patient taking differently: Take 75 mg by mouth daily.  08/14/14   Jeralyn BennettEzequiel Zamora, MD  esomeprazole (NEXIUM) 40 MG capsule Take 40 mg by mouth at bedtime as needed (for acid reflux).     Historical Provider, MD  gabapentin (NEURONTIN) 100 MG capsule TAKE 1 CAPSULE BY MOUTH THREE TIMES DAILY 08/25/15   Marvel PlanJindong Xu, MD  gabapentin (NEURONTIN) 100 MG capsule Take 100 mg by mouth 2 (two) times daily.    Historical Provider, MD  hydrochlorothiazide (HYDRODIURIL) 25 MG tablet Take 25 mg by mouth daily.  07/12/15   Historical Provider, MD  loratadine (CLARITIN) 10 MG tablet Take 10 mg by mouth daily as needed for allergies.  12/31/14 12/31/15  Historical Provider, MD  metFORMIN (GLUCOPHAGE) 500 MG tablet Take 1 tablet (500 mg total) by mouth 2 (two) times daily with a meal.  07/06/14   Calvert CantorSaima Rizwan, MD  nitroGLYCERIN (NITROSTAT) 0.4 MG SL tablet Place 1 tablet (0.4 mg total) under the tongue every 5 (five) minutes x 3 doses as needed for chest pain. 07/08/14   Orpah CobbAjay Kadakia, MD  ramipril (ALTACE) 5 MG capsule Take 5 mg by mouth daily. 09/03/15   Historical Provider, MD  tiotropium (SPIRIVA HANDIHALER) 18 MCG inhalation capsule Place 1 capsule (18 mcg total) into inhaler and inhale every morning. Patient taking differently: Place 18 mcg into inhaler and inhale as needed (for shortness of breath).  08/14/14   Jeralyn BennettEzequiel Zamora, MD  triamcinolone cream (KENALOG) 0.1 % Apply 1  application topically 2 (two) times daily.    Historical Provider, MD    Family History Family History  Problem Relation Age of Onset  . Heart disease Mother     s/p 3V CABG  . Cancer Father 4772    lung cancer; +tobacco  . Stroke Father   . Cancer Maternal Grandmother   . Heart disease Maternal Grandmother   . Cancer Paternal Grandmother   . Lupus Sister   . Multiple sclerosis Sister   . Anemia Daughter     Social History Social History  Substance Use Topics  . Smoking status: Former Smoker    Packs/day: 0.00    Types: Cigarettes    Start date: 01/15/1975    Quit date: 06/05/2014  . Smokeless tobacco: Never Used     Comment: quit smoking in Aug 2015  . Alcohol use 0.0 oz/week     Comment: every wednesday - 3-4 beers     Allergies   Tylox [oxycodone-acetaminophen] and Adhesive [tape]   Review of Systems Review of Systems  Eyes: Positive for blurred vision.  Gastrointestinal: Negative for vomiting.  Neurological: Negative for weakness and numbness.  All other systems reviewed and are negative.    Physical Exam Updated Vital Signs BP 111/59   Pulse (!) 57   Temp 98.2 F (36.8 C) (Oral)   Resp 17   Ht 5\' 8"  (1.727 m)   Wt 241 lb (109.3 kg)   SpO2 97%   BMI 36.64 kg/m   Physical Exam  Constitutional: He is oriented to person, place, and time. He appears well-developed  and well-nourished. No distress.  HENT:  Head: Normocephalic and atraumatic.  Mouth/Throat: Oropharynx is clear and moist.  Eyes: EOM are normal. Pupils are equal, round, and reactive to light.  Neck: Normal range of motion. Neck supple.  Cardiovascular: Normal rate and regular rhythm.  Exam reveals no friction rub.   No murmur heard. Pulmonary/Chest: Effort normal and breath sounds normal. No respiratory distress. He has no wheezes. He has no rales.  Abdominal: Soft. Bowel sounds are normal. He exhibits no distension. There is no tenderness.  Musculoskeletal: Normal range of motion. He exhibits no edema.  Neurological: He is alert and oriented to person, place, and time. No cranial nerve deficit. He exhibits normal muscle tone. Coordination normal.  Skin: Skin is warm and dry. He is not diaphoretic.  Nursing note and vitals reviewed.    ED Treatments / Results  Labs (all labs ordered are listed, but only abnormal results are displayed) Labs Reviewed  BASIC METABOLIC PANEL  CBC WITH DIFFERENTIAL/PLATELET    EKG  EKG Interpretation None       Radiology No results found.  Procedures Procedures (including critical care time)  Medications Ordered in ED Medications - No data to display   Initial Impression / Assessment and Plan / ED Course  I have reviewed the triage vital signs and the nursing notes.  Pertinent labs & imaging results that were available during my care of the patient were reviewed by me and considered in my medical decision making (see chart for details).  Clinical Course     Patient with history of carotid stent and prior stroke presents with complaints of headache and blurry vision after hitting his head on a ladder 2 days ago. There is no loss of consciousness and he is neurologically intact. CT scan shows no obvious abnormality. At this point I do not feel as though any further workup is indicated. The patient will  be discharged with instructions to  follow-up with his eye doctor if his blurriness is not improving in the next 2 days. I see nothing on his exam that is abnormal neurologically or with his ophthalmologic exam.  Final Clinical Impressions(s) / ED Diagnoses   Final diagnoses:  None    New Prescriptions New Prescriptions   No medications on file     Geoffery Lyons, MD 09/21/16 1321

## 2016-09-21 NOTE — ED Notes (Signed)
Dr. Delo at bedside. 

## 2016-09-21 NOTE — ED Triage Notes (Signed)
Per Pt he hit the top of his head on an extension ladder on Friday. He states that ever since then his head has been hurting where he hit it and he has also been hurting in his eyes when he looks side to side. Pt states that his "corner of the left eye is blurry". He states that this started on Sunday. Pt states that his eyes hurt when he touches them. Pt is on plavix,

## 2016-09-21 NOTE — ED Notes (Signed)
Pt transported to CT ?

## 2016-09-21 NOTE — Discharge Instructions (Signed)
Follow-up with your eye doctor if your blurriness is not improving in the next 24-48 hours.  Return to the ER if you develop worsening headache, or other new and concerning symptoms.

## 2016-10-09 DIAGNOSIS — S39012A Strain of muscle, fascia and tendon of lower back, initial encounter: Secondary | ICD-10-CM | POA: Insufficient documentation

## 2016-10-15 ENCOUNTER — Telehealth (HOSPITAL_COMMUNITY): Payer: Self-pay

## 2016-10-15 NOTE — Telephone Encounter (Signed)
Pt's mother agreed to have pt f/u in 6 months. AW

## 2016-10-19 ENCOUNTER — Other Ambulatory Visit (HOSPITAL_COMMUNITY)
Admission: RE | Admit: 2016-10-19 | Discharge: 2016-10-19 | Disposition: A | Discharge status: Home / Self Care | Payer: Medicaid Other | Source: Ambulatory Visit | Attending: Cardiology | Admitting: Cardiology

## 2016-10-19 DIAGNOSIS — E785 Hyperlipidemia, unspecified: Secondary | ICD-10-CM | POA: Diagnosis not present

## 2016-10-19 DIAGNOSIS — E119 Type 2 diabetes mellitus without complications: Secondary | ICD-10-CM | POA: Insufficient documentation

## 2016-10-19 DIAGNOSIS — I10 Essential (primary) hypertension: Secondary | ICD-10-CM | POA: Diagnosis present

## 2016-10-19 DIAGNOSIS — Z8673 Personal history of transient ischemic attack (TIA), and cerebral infarction without residual deficits: Secondary | ICD-10-CM | POA: Diagnosis not present

## 2016-10-19 LAB — HEPATIC FUNCTION PANEL
ALBUMIN: 3.9 g/dL (ref 3.5–5.0)
ALT: 21 U/L (ref 17–63)
AST: 19 U/L (ref 15–41)
Alkaline Phosphatase: 51 U/L (ref 38–126)
BILIRUBIN TOTAL: 0.5 mg/dL (ref 0.3–1.2)
Bilirubin, Direct: <0.1 mg/dL — ABNORMAL LOW (ref 0.1–0.5)
TOTAL PROTEIN: 6.5 g/dL (ref 6.5–8.1)

## 2016-10-19 LAB — BASIC METABOLIC PANEL
ANION GAP: 6 (ref 5–15)
BUN: 10 mg/dL (ref 6–20)
CALCIUM: 9 mg/dL (ref 8.9–10.3)
CHLORIDE: 103 mmol/L (ref 101–111)
CO2: 29 mmol/L (ref 22–32)
Creatinine, Ser: 0.8 mg/dL (ref 0.61–1.24)
GFR calc non Af Amer: >60 mL/min (ref >60)
Glucose, Bld: 101 mg/dL — ABNORMAL HIGH (ref 65–99)
Potassium: 4.3 mmol/L (ref 3.5–5.1)
Sodium: 138 mmol/L (ref 135–145)

## 2016-10-19 LAB — LIPID PANEL
CHOL/HDL RATIO: 3.2 ratio
Cholesterol: 130 mg/dL (ref 0–200)
HDL: 41 mg/dL (ref >40)
LDL CALC: 71 mg/dL (ref 0–99)
TRIGLYCERIDES: 89 mg/dL (ref <150)
VLDL: 18 mg/dL (ref 0–40)

## 2016-10-26 ENCOUNTER — Other Ambulatory Visit: Payer: Self-pay | Admitting: Neurology

## 2016-10-26 DIAGNOSIS — R040 Epistaxis: Secondary | ICD-10-CM | POA: Insufficient documentation

## 2016-11-18 ENCOUNTER — Emergency Department (HOSPITAL_COMMUNITY): Payer: Medicaid Other

## 2016-11-18 ENCOUNTER — Emergency Department (HOSPITAL_COMMUNITY)
Admission: EM | Admit: 2016-11-18 | Discharge: 2016-11-18 | Disposition: A | Discharge status: Home / Self Care | Payer: Medicaid Other | Attending: Emergency Medicine | Admitting: Emergency Medicine

## 2016-11-18 ENCOUNTER — Encounter (HOSPITAL_COMMUNITY): Payer: Self-pay | Admitting: Emergency Medicine

## 2016-11-18 DIAGNOSIS — Z7982 Long term (current) use of aspirin: Secondary | ICD-10-CM | POA: Diagnosis not present

## 2016-11-18 DIAGNOSIS — M25552 Pain in left hip: Secondary | ICD-10-CM

## 2016-11-18 DIAGNOSIS — J449 Chronic obstructive pulmonary disease, unspecified: Secondary | ICD-10-CM | POA: Diagnosis not present

## 2016-11-18 DIAGNOSIS — Y92488 Other paved roadways as the place of occurrence of the external cause: Secondary | ICD-10-CM | POA: Insufficient documentation

## 2016-11-18 DIAGNOSIS — Z8673 Personal history of transient ischemic attack (TIA), and cerebral infarction without residual deficits: Secondary | ICD-10-CM | POA: Insufficient documentation

## 2016-11-18 DIAGNOSIS — Z87891 Personal history of nicotine dependence: Secondary | ICD-10-CM | POA: Diagnosis not present

## 2016-11-18 DIAGNOSIS — N189 Chronic kidney disease, unspecified: Secondary | ICD-10-CM | POA: Diagnosis not present

## 2016-11-18 DIAGNOSIS — Z7984 Long term (current) use of oral hypoglycemic drugs: Secondary | ICD-10-CM | POA: Insufficient documentation

## 2016-11-18 DIAGNOSIS — I129 Hypertensive chronic kidney disease with stage 1 through stage 4 chronic kidney disease, or unspecified chronic kidney disease: Secondary | ICD-10-CM | POA: Insufficient documentation

## 2016-11-18 DIAGNOSIS — Y999 Unspecified external cause status: Secondary | ICD-10-CM | POA: Diagnosis not present

## 2016-11-18 DIAGNOSIS — E1122 Type 2 diabetes mellitus with diabetic chronic kidney disease: Secondary | ICD-10-CM | POA: Insufficient documentation

## 2016-11-18 DIAGNOSIS — Y9389 Activity, other specified: Secondary | ICD-10-CM | POA: Diagnosis not present

## 2016-11-18 MED ORDER — METHOCARBAMOL 500 MG PO TABS: 500.0000 mg | ORAL_TABLET | Freq: Two times a day (BID) | ORAL | 0 refills | Status: DC | PRN

## 2016-11-18 MED ORDER — ACETAMINOPHEN 325 MG PO TABS
650.0000 mg | ORAL_TABLET | Freq: Once | ORAL | Status: AC
  Administered 2016-11-18: 650 mg via ORAL
  Filled 2016-11-18: qty 2

## 2016-11-18 MED ORDER — ACETAMINOPHEN 500 MG PO TABS: 500.0000 mg | ORAL_TABLET | Freq: Four times a day (QID) | ORAL | 0 refills | Status: DC | PRN

## 2016-11-18 NOTE — ED Notes (Signed)
Patient was alert, oriented and stable upon discharge. RN went over AVS and patient had no further questions.  Pt was instructed to not drive on muscle relaxers.

## 2016-11-18 NOTE — ED Notes (Signed)
Patient transported to X-ray 
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
## 2016-12-17 ENCOUNTER — Encounter (HOSPITAL_COMMUNITY): Payer: Self-pay | Admitting: General Practice

## 2016-12-17 ENCOUNTER — Inpatient Hospital Stay (HOSPITAL_COMMUNITY)
Admission: AD | Admit: 2016-12-17 | Discharge: 2016-12-19 | DRG: 310 | Disposition: A | Discharge status: Home / Self Care | Payer: Commercial Managed Care - HMO | Source: Ambulatory Visit | Attending: Cardiology | Admitting: Cardiology

## 2016-12-17 DIAGNOSIS — N189 Chronic kidney disease, unspecified: Secondary | ICD-10-CM | POA: Diagnosis present

## 2016-12-17 DIAGNOSIS — I480 Paroxysmal atrial fibrillation: Secondary | ICD-10-CM | POA: Diagnosis present

## 2016-12-17 DIAGNOSIS — E663 Overweight: Secondary | ICD-10-CM | POA: Diagnosis not present

## 2016-12-17 DIAGNOSIS — I639 Cerebral infarction, unspecified: Secondary | ICD-10-CM | POA: Diagnosis not present

## 2016-12-17 DIAGNOSIS — Z8673 Personal history of transient ischemic attack (TIA), and cerebral infarction without residual deficits: Secondary | ICD-10-CM | POA: Diagnosis not present

## 2016-12-17 DIAGNOSIS — K219 Gastro-esophageal reflux disease without esophagitis: Secondary | ICD-10-CM | POA: Diagnosis present

## 2016-12-17 DIAGNOSIS — I481 Persistent atrial fibrillation: Secondary | ICD-10-CM | POA: Diagnosis not present

## 2016-12-17 DIAGNOSIS — I129 Hypertensive chronic kidney disease with stage 1 through stage 4 chronic kidney disease, or unspecified chronic kidney disease: Secondary | ICD-10-CM | POA: Diagnosis not present

## 2016-12-17 DIAGNOSIS — Z823 Family history of stroke: Secondary | ICD-10-CM

## 2016-12-17 DIAGNOSIS — J449 Chronic obstructive pulmonary disease, unspecified: Secondary | ICD-10-CM | POA: Diagnosis not present

## 2016-12-17 DIAGNOSIS — Z8679 Personal history of other diseases of the circulatory system: Secondary | ICD-10-CM | POA: Insufficient documentation

## 2016-12-17 DIAGNOSIS — E1151 Type 2 diabetes mellitus with diabetic peripheral angiopathy without gangrene: Secondary | ICD-10-CM | POA: Diagnosis present

## 2016-12-17 DIAGNOSIS — E1122 Type 2 diabetes mellitus with diabetic chronic kidney disease: Secondary | ICD-10-CM | POA: Diagnosis not present

## 2016-12-17 DIAGNOSIS — Z87891 Personal history of nicotine dependence: Secondary | ICD-10-CM

## 2016-12-17 DIAGNOSIS — Z8249 Family history of ischemic heart disease and other diseases of the circulatory system: Secondary | ICD-10-CM

## 2016-12-17 DIAGNOSIS — R0602 Shortness of breath: Secondary | ICD-10-CM | POA: Diagnosis present

## 2016-12-17 DIAGNOSIS — I4891 Unspecified atrial fibrillation: Secondary | ICD-10-CM | POA: Diagnosis not present

## 2016-12-17 DIAGNOSIS — R079 Chest pain, unspecified: Secondary | ICD-10-CM | POA: Diagnosis not present

## 2016-12-17 DIAGNOSIS — Z832 Family history of diseases of the blood and blood-forming organs and certain disorders involving the immune mechanism: Secondary | ICD-10-CM | POA: Diagnosis not present

## 2016-12-17 DIAGNOSIS — E785 Hyperlipidemia, unspecified: Secondary | ICD-10-CM | POA: Diagnosis not present

## 2016-12-17 DIAGNOSIS — E119 Type 2 diabetes mellitus without complications: Secondary | ICD-10-CM | POA: Diagnosis not present

## 2016-12-17 DIAGNOSIS — I10 Essential (primary) hypertension: Secondary | ICD-10-CM | POA: Diagnosis not present

## 2016-12-17 DIAGNOSIS — R0789 Other chest pain: Secondary | ICD-10-CM | POA: Diagnosis not present

## 2016-12-17 HISTORY — DX: Family history of other specified conditions: Z84.89

## 2016-12-17 HISTORY — DX: Other chronic pain: G89.29

## 2016-12-17 HISTORY — DX: Sleep apnea, unspecified: G47.30

## 2016-12-17 HISTORY — DX: Low back pain: M54.5

## 2016-12-17 HISTORY — DX: Major depressive disorder, single episode, unspecified: F32.9

## 2016-12-17 HISTORY — DX: Unspecified asthma, uncomplicated: J45.909

## 2016-12-17 HISTORY — DX: Type 2 diabetes mellitus without complications: E11.9

## 2016-12-17 HISTORY — DX: Depression, unspecified: F32.A

## 2016-12-17 HISTORY — DX: Low back pain, unspecified: M54.50

## 2016-12-17 LAB — COMPREHENSIVE METABOLIC PANEL
ALBUMIN: 3.4 g/dL — AB (ref 3.5–5.0)
ALK PHOS: 52 U/L (ref 38–126)
ALT: 20 U/L (ref 17–63)
AST: 19 U/L (ref 15–41)
Anion gap: 8 (ref 5–15)
BILIRUBIN TOTAL: 0.5 mg/dL (ref 0.3–1.2)
BUN: 11 mg/dL (ref 6–20)
CALCIUM: 8.9 mg/dL (ref 8.9–10.3)
CO2: 28 mmol/L (ref 22–32)
CREATININE: 0.92 mg/dL (ref 0.61–1.24)
Chloride: 102 mmol/L (ref 101–111)
GFR calc Af Amer: >60 mL/min (ref >60)
GFR calc non Af Amer: >60 mL/min (ref >60)
Glucose, Bld: 116 mg/dL — ABNORMAL HIGH (ref 65–99)
POTASSIUM: 3.6 mmol/L (ref 3.5–5.1)
Sodium: 138 mmol/L (ref 135–145)
Total Protein: 6.3 g/dL — ABNORMAL LOW (ref 6.5–8.1)

## 2016-12-17 LAB — MAGNESIUM: Magnesium: 1.9 mg/dL (ref 1.7–2.4)

## 2016-12-17 LAB — CBC WITH DIFFERENTIAL/PLATELET
Basophils Absolute: 0 10*3/uL (ref 0.0–0.1)
Basophils Relative: 0 %
EOS PCT: 3 %
Eosinophils Absolute: 0.2 10*3/uL (ref 0.0–0.7)
HCT: 40.4 % (ref 39.0–52.0)
Hemoglobin: 13.6 g/dL (ref 13.0–17.0)
LYMPHS PCT: 39 %
Lymphs Abs: 2.8 10*3/uL (ref 0.7–4.0)
MCH: 29.4 pg (ref 26.0–34.0)
MCHC: 33.7 g/dL (ref 30.0–36.0)
MCV: 87.3 fL (ref 78.0–100.0)
MONOS PCT: 8 %
Monocytes Absolute: 0.6 10*3/uL (ref 0.1–1.0)
Neutro Abs: 3.6 10*3/uL (ref 1.7–7.7)
Neutrophils Relative %: 50 %
PLATELETS: 218 10*3/uL (ref 150–400)
RBC: 4.63 MIL/uL (ref 4.22–5.81)
RDW: 12.7 % (ref 11.5–15.5)
WBC: 7.2 10*3/uL (ref 4.0–10.5)

## 2016-12-17 LAB — TSH: TSH: 0.83 u[IU]/mL (ref 0.350–4.500)

## 2016-12-17 LAB — GLUCOSE, CAPILLARY: Glucose-Capillary: 101 mg/dL — ABNORMAL HIGH (ref 65–99)

## 2016-12-17 LAB — TROPONIN I: Troponin I: <0.03 ng/mL (ref <0.03)

## 2016-12-17 MED ORDER — HEPARIN (PORCINE) IN NACL 100-0.45 UNIT/ML-% IJ SOLN
1400.0000 [IU]/h | INTRAMUSCULAR | Status: DC
  Administered 2016-12-17: 1200 [IU]/h via INTRAVENOUS
  Administered 2016-12-18: 1400 [IU]/h via INTRAVENOUS
  Filled 2016-12-17 (×2): qty 250

## 2016-12-17 MED ORDER — ATORVASTATIN CALCIUM 20 MG PO TABS
20.0000 mg | ORAL_TABLET | Freq: Every day | ORAL | Status: DC
  Administered 2016-12-17 – 2016-12-19 (×3): 20 mg via ORAL
  Filled 2016-12-17 (×3): qty 1

## 2016-12-17 MED ORDER — AMIODARONE LOAD VIA INFUSION
150.0000 mg | Freq: Once | INTRAVENOUS | Status: AC
  Administered 2016-12-17: 150 mg via INTRAVENOUS
  Filled 2016-12-17: qty 83.34

## 2016-12-17 MED ORDER — ASPIRIN EC 81 MG PO TBEC: 81.0000 mg | DELAYED_RELEASE_TABLET | ORAL | Status: DC

## 2016-12-17 MED ORDER — INSULIN ASPART 100 UNIT/ML ~~LOC~~ SOLN: 0.0000 [IU] | Freq: Three times a day (TID) | SUBCUTANEOUS | Status: DC

## 2016-12-17 MED ORDER — METOPROLOL TARTRATE 5 MG/5ML IV SOLN
2.5000 mg | Freq: Once | INTRAVENOUS | Status: AC
  Administered 2016-12-17: 2.5 mg via INTRAVENOUS

## 2016-12-17 MED ORDER — ONDANSETRON HCL 4 MG/2ML IJ SOLN: 4.0000 mg | Freq: Four times a day (QID) | INTRAMUSCULAR | Status: DC | PRN

## 2016-12-17 MED ORDER — METOPROLOL TARTRATE 12.5 MG HALF TABLET
12.5000 mg | ORAL_TABLET | Freq: Two times a day (BID) | ORAL | Status: DC
  Administered 2016-12-17 – 2016-12-18 (×3): 12.5 mg via ORAL
  Filled 2016-12-17 (×3): qty 1

## 2016-12-17 MED ORDER — NITROGLYCERIN 0.4 MG SL SUBL: 0.4000 mg | SUBLINGUAL_TABLET | SUBLINGUAL | Status: DC | PRN

## 2016-12-17 MED ORDER — CLOPIDOGREL BISULFATE 75 MG PO TABS
37.5000 mg | ORAL_TABLET | Freq: Every day | ORAL | Status: DC
  Administered 2016-12-18 – 2016-12-19 (×2): 37.5 mg via ORAL
  Filled 2016-12-17 (×3): qty 1

## 2016-12-17 MED ORDER — SODIUM CHLORIDE 0.9 % IV SOLN
INTRAVENOUS | Status: DC
  Administered 2016-12-17 – 2016-12-18 (×2): via INTRAVENOUS

## 2016-12-17 MED ORDER — ASPIRIN 81 MG PO CHEW
324.0000 mg | CHEWABLE_TABLET | ORAL | Status: AC
  Administered 2016-12-17: 324 mg via ORAL
  Filled 2016-12-17: qty 4

## 2016-12-17 MED ORDER — ASPIRIN 300 MG RE SUPP: 300.0000 mg | RECTAL | Status: AC

## 2016-12-17 MED ORDER — ASPIRIN EC 81 MG PO TBEC
81.0000 mg | DELAYED_RELEASE_TABLET | Freq: Every day | ORAL | Status: DC
  Administered 2016-12-18 – 2016-12-19 (×2): 81 mg via ORAL
  Filled 2016-12-17 (×2): qty 1

## 2016-12-17 MED ORDER — AMIODARONE HCL IN DEXTROSE 360-4.14 MG/200ML-% IV SOLN
60.0000 mg/h | INTRAVENOUS | Status: DC
  Administered 2016-12-17: 60 mg/h via INTRAVENOUS
  Filled 2016-12-17 (×3): qty 200

## 2016-12-17 MED ORDER — HEPARIN BOLUS VIA INFUSION
4000.0000 [IU] | Freq: Once | INTRAVENOUS | Status: AC
  Administered 2016-12-17: 4000 [IU] via INTRAVENOUS
  Filled 2016-12-17: qty 4000

## 2016-12-17 MED ORDER — PANTOPRAZOLE SODIUM 40 MG PO TBEC
40.0000 mg | DELAYED_RELEASE_TABLET | Freq: Every day | ORAL | Status: DC
  Administered 2016-12-18 – 2016-12-19 (×2): 40 mg via ORAL
  Filled 2016-12-17 (×2): qty 1

## 2016-12-17 MED ORDER — METOPROLOL TARTRATE 5 MG/5ML IV SOLN
INTRAVENOUS | Status: AC
  Filled 2016-12-17: qty 5

## 2016-12-17 MED ORDER — AMIODARONE HCL IN DEXTROSE 360-4.14 MG/200ML-% IV SOLN
30.0000 mg/h | INTRAVENOUS | Status: DC
  Administered 2016-12-18 (×3): 30 mg/h via INTRAVENOUS
  Filled 2016-12-17 (×2): qty 200

## 2016-12-17 NOTE — H&P (Signed)
Ivan ClontsRichard L [name] is an 56 y.o. male.   Chief Complaint: vague left-sided chest pain associated with palpitations and shortness of breath [id number]:patient is 56 year old male with past medical history significant for multiple medical problems, I.e.CVAmultiple cerebral aneurysm, status post stenting and coiling in the past and also right ICA stenting in December 2016, hypertension, diabetes mellitus, hyperlipidemia, morbid obesity, tobacco abuse,came to the office complaining of vague left-sided chest pain associated with palpitation and shortness of breath since last night.  Also complains of feeling tired, fatigue, no energy.  Denies any PND, orthopnea or leg swelling.  EKG done in the office showed new onset A. Fib with RVR.  Denies such episodes in the past.  Past Medical History:  Diagnosis Date  . Aneurysm (HCC)    brain  . Arthritis   . Asthma    Last asthma attack at age 89; History of trach at 16 months  . Back pain    reason unknown   . Bipolar disorder (HCC)    was on meds but was taken off 2 yrs ago and none since  . Burning pain    in both legs-seeing Dr.Sethi for this  . Cataracts, bilateral   . Chronic kidney disease 2009   chronic kidney disease  . Complication of anesthesia   . COPD (chronic obstructive pulmonary disease) (HCC) 06/2012   uses Spiriva and Albuterol daily as needed  . Diabetes mellitus without complication (HCC)    takes Metformin daily  . GERD (gastroesophageal reflux disease)    was on Nexium;taking Omeprazole daily as needed  . H/O hiatal hernia   . Headache(784.0)    "all the time"  . History of bronchitis 2014  . Hyperlipidemia    takes Ramipril daily  . Hypertension    takes Ramipril daily  . Joint pain   . Joint swelling   . Nocturia   . Numbness    both arms   . Pneumonia    hx of-2014  . PONV (postoperative nausea and vomiting)    Pt reports nausea only.  . Stroke (HCC)    2015  . Umbilical hernia   . Urinary frequency     Past Surgical  History:  Procedure Laterality Date  . angiogram with coiling    . COLONOSCOPY    . cyst removed from right wrist    . KNEE ARTHROSCOPY Right   . RADIOLOGY WITH ANESTHESIA N/A 08/08/2014   Procedure: RADIOLOGY WITH ANESTHESIA EMBOLIZATION;  Surgeon: Medication Radiologist, MD;  Location: [facility] OR;  Service: Radiology;  Laterality: N/A;  . RADIOLOGY WITH ANESTHESIA N/A 11/21/2014   Procedure: RADIOLOGY WITH ANESTHESIA;  Surgeon: Oneal GroutSanjeev K Deveshwar, MD;  Location: [facility] OR;  Service: Radiology;  Laterality: N/A;  . RADIOLOGY WITH ANESTHESIA N/A 02/27/2015   Procedure: RADIOLOGY WITH ANESTHESIA;  Surgeon: Julieanne CottonSanjeev Deveshwar, MD;  Location: [facility] OR;  Service: Radiology;  Laterality: N/A;  . RADIOLOGY WITH ANESTHESIA N/A 09/25/2015   Procedure: RADIOLOGY WITH ANESTHESIA;  Surgeon: Julieanne CottonSanjeev Deveshwar, MD;  Location: [facility] OR;  Service: Radiology;  Laterality: N/A;  . RECTAL SURGERY    . ROTATOR CUFF REPAIR Left 1996  . ROTATOR CUFF REPAIR Right 2016  . TRACHEOSTOMY     at age 55 months old  . TRACHEOSTOMY CLOSURE      Family History  Problem Relation Age of Onset  . Heart disease Mother     s/p 3V CABG  . Cancer Father 3872    lung cancer; +tobacco  . Stroke Father   .  Cancer Maternal Grandmother   . Heart disease Maternal Grandmother   . Cancer Paternal Grandmother   . Lupus Sister   . Multiple sclerosis Sister   . Anemia Daughter    Social History:  reports that he quit smoking about 2 years ago. His smoking use included Cigarettes. He started smoking about 41 years ago. He smoked 0.00 packs per day. He has never used smokeless tobacco. He reports that he drinks alcohol. He reports that he does not use drugs.  Allergies:  Allergies  Allergen Reactions  . Tylox [Oxycodone-Acetaminophen] Hives, Other (See Comments) and Rash    Sweating, shaking Reaction: Tremors and diaphoresis   . Adhesive [Tape] Rash    Medications Prior to Admission  Medication Sig Dispense Refill  . acetaminophen  (TYLENOL) 500 MG tablet Take 1 tablet (500 mg total) by mouth every 6 (six) hours as needed. 30 tablet 0  . albuterol (PROVENTIL HFA;VENTOLIN HFA) 108 (90 BASE) MCG/ACT inhaler Inhale 2 puffs into the lungs every 6 (six) hours as needed for wheezing or shortness of breath.     [name] aspirin EC 81 MG tablet Take 81 mg by mouth every other day.    [name] atorvastatin (LIPITOR) 20 MG tablet Take 1 tablet (20 mg total) by mouth daily at 6 PM. 30 tablet 0  . clopidogrel (PLAVIX) 75 MG tablet Take 0.5 tablets (37.5 mg total) by mouth daily. 30 tablet 1  . esomeprazole (NEXIUM) 40 MG capsule Take 40 mg by mouth at bedtime as needed (for acid reflux).     . gabapentin (NEURONTIN) 100 MG capsule TAKE 1 CAPSULE BY MOUTH THREE TIMES DAILY 270 capsule 3  . gabapentin (NEURONTIN) 100 MG capsule Take 100 mg by mouth 2 (two) times daily.    . hydrochlorothiazide (HYDRODIURIL) 25 MG tablet Take 25 mg by mouth daily.   2  . loratadine (CLARITIN) 10 MG tablet Take 10 mg by mouth daily as needed for allergies.     . metFORMIN (GLUCOPHAGE) 500 MG tablet Take 1 tablet (500 mg total) by mouth 2 (two) times daily with a meal. (Patient taking differently: Take 500 mg by mouth daily with breakfast. ) 60 tablet 0  . methocarbamol (ROBAXIN) 500 MG tablet Take 1 tablet (500 mg total) by mouth 2 (two) times daily as needed for muscle spasms. 20 tablet 0  . nitroGLYCERIN (NITROSTAT) 0.4 MG SL tablet Place 1 tablet (0.4 mg total) under the tongue every 5 (five) minutes x 3 doses as needed for chest pain. 25 tablet 1  . ramipril (ALTACE) 5 MG capsule Take 5 mg by mouth daily.  3  . tiotropium (SPIRIVA HANDIHALER) 18 MCG inhalation capsule Place 1 capsule (18 mcg total) into inhaler and inhale every morning. (Patient taking differently: Place 18 mcg into inhaler and inhale as needed (for shortness of breath). ) 30 capsule 1  . triamcinolone cream (KENALOG) 0.1 % Apply 1 application topically 2 (two) times daily.      No results found for  this or any previous visit (from the past 48 hour(s)). No results found.  Review of Systems  Constitutional: Positive for malaise/fatigue. Negative for chills, fever and weight loss.  Eyes: Negative for blurred vision and double vision.  Respiratory: Positive for shortness of breath.   Cardiovascular: Positive for chest pain and palpitations.  Gastrointestinal: Negative for nausea and vomiting.  Genitourinary: Negative for dysuria.  Neurological: Positive for dizziness and weakness.    There were no vitals taken for this visit. Physical  Exam  Constitutional: He is oriented to person, place, and time.  HENT:  Head: Normocephalic and atraumatic.  Eyes: Conjunctivae are normal. Pupils are equal, round, and reactive to light. Left eye exhibits no discharge. No scleral icterus.  Neck: Normal range of motion. Neck supple. No JVD present. No tracheal deviation present.  Cardiovascular:  Irregularly irregular, S1, S2 soft.  There is soft systolic murmur noted  Respiratory: Effort normal and breath sounds normal. No respiratory distress. He has no wheezes. He has no rales.  GI: Soft. Bowel sounds are normal. He exhibits no distension. There is no tenderness.  Musculoskeletal: He exhibits no edema, tenderness or deformity.  Neurological: He is alert and oriented to person, place, and time. No cranial nerve deficit.     Assessment/Plan Chest pain rule out MI. New onset A. Fib with RVR.CHADSVASC SCORE OF 4 Hypertension. Diabetes mellitus. Hyperlipidemia. Morbid obesity. Tobacco abuse. PLAN As per orders  Rinaldo Cloud, MD 12/17/2016, 5:00 PM

## 2016-12-17 NOTE — Progress Notes (Signed)
  Amiodarone Drug - Drug Interaction Consult Note  Recommendations: None at this time, pharmacy will continue to monitor patient for drug interactions.   Amiodarone is metabolized by the cytochrome P450 system and therefore has the potential to cause many drug interactions. Amiodarone has an average plasma half-life of 50 days (range 20 to 100 days).   There is potential for drug interactions to occur several weeks or months after stopping treatment and the onset of drug interactions may be slow after initiating amiodarone.   [x]  Statins: Increased risk of myopathy. Simvastatin- restrict dose to 20mg  daily. Other statins: counsel patients to report any muscle pain or weakness immediately.  []  Anticoagulants: Amiodarone can increase anticoagulant effect. Consider warfarin dose reduction. Patients should be monitored closely and the dose of anticoagulant altered accordingly, remembering that amiodarone levels take several weeks to stabilize.  []  Antiepileptics: Amiodarone can increase plasma concentration of phenytoin, the dose should be reduced. Note that small changes in phenytoin dose can result in large changes in levels. Monitor patient and counsel on signs of toxicity.  [x]  Beta blockers: increased risk of bradycardia, AV block and myocardial depression. Sotalol - avoid concomitant use.  []   Calcium channel blockers (diltiazem and verapamil): increased risk of bradycardia, AV block and myocardial depression.  []   Cyclosporine: Amiodarone increases levels of cyclosporine. Reduced dose of cyclosporine is recommended.  []  Digoxin dose should be halved when amiodarone is started.  []  Diuretics: increased risk of cardiotoxicity if hypokalemia occurs.  []  Oral hypoglycemic agents (glyburide, glipizide, glimepiride): increased risk of hypoglycemia. Patient's glucose levels should be monitored closely when initiating amiodarone therapy.   []  Drugs that prolong the QT interval:  Torsades de  pointes risk may be increased with concurrent use - avoid if possible.  Monitor QTc, also keep magnesium/potassium WNL if concurrent therapy can't be avoided. [name]. Antibiotics: e.g. fluoroquinolones, erythromycin. . Antiarrhythmics: e.g. quinidine, procainamide, disopyramide, sotalol. . Antipsychotics: e.g. phenothiazines, haloperidol.  . Lithium, tricyclic antidepressants, and methadone. Thank Brien MatesYou,  Shakai Dolley L Oakes Mccready  12/17/2016 6:10 PM

## 2016-12-17 NOTE — Progress Notes (Signed)
ANTICOAGULATION CONSULT NOTE - Initial Consult  Pharmacy Consult for heparin Indication: chest pain/ACS  Allergies  Allergen Reactions  . Tylox [Oxycodone-Acetaminophen] Hives, Other (See Comments) and Rash    Sweating, shaking Reaction: Tremors and diaphoresis   . Adhesive [Tape] Rash   Patient Measurements: Weight: 252 lb (114.3 kg) Heparin Dosing Weight: 94 Kg  Vital Signs:   Labs: No results for input(s): HGB, HCT, PLT, APTT, LABPROT, INR, HEPARINUNFRC, HEPRLOWMOCWT, CREATININE, CKTOTAL, CKMB, TROPONINI in the last 72 hours.  CrCl cannot be calculated (Patient's most recent lab result is older than the maximum 21 days allowed.).  Medical History: Past Medical History:  Diagnosis Date  . Aneurysm (HCC)    brain  . Arthritis   . Asthma    Last asthma attack at age 44; History of trach at 16 months  . Back pain    reason unknown   . Bipolar disorder (HCC)    was on meds but was taken off 2 yrs ago and none since  . Burning pain    in both legs-seeing Dr.Sethi for this  . Cataracts, bilateral   . Chronic kidney disease 2009   chronic kidney disease  . Complication of anesthesia   . COPD (chronic obstructive pulmonary disease) (HCC) 06/2012   uses Spiriva and Albuterol daily as needed  . Diabetes mellitus without complication (HCC)    takes Metformin daily  . GERD (gastroesophageal reflux disease)    was on Nexium;taking Omeprazole daily as needed  . H/O hiatal hernia   . Headache(784.0)    "all the time"  . History of bronchitis 2014  . Hyperlipidemia    takes Ramipril daily  . Hypertension    takes Ramipril daily  . Joint pain   . Joint swelling   . Nocturia   . Numbness    both arms   . Pneumonia    hx of-2014  . PONV (postoperative nausea and vomiting)    Pt reports nausea only.  . Stroke (HCC)    2015  . Umbilical hernia   . Urinary frequency    Medications:  Prescriptions Prior to Admission  Medication Sig Dispense Refill Last Dose  .  acetaminophen (TYLENOL) 500 MG tablet Take 1 tablet (500 mg total) by mouth every 6 (six) hours as needed. 30 tablet 0   . albuterol (PROVENTIL HFA;VENTOLIN HFA) 108 (90 BASE) MCG/ACT inhaler Inhale 2 puffs into the lungs every 6 (six) hours as needed for wheezing or shortness of breath.    Past Month at Unknown time  . aspirin EC 81 MG tablet Take 81 mg by mouth every other day.   09/21/2016 at Unknown time  . atorvastatin (LIPITOR) 20 MG tablet Take 1 tablet (20 mg total) by mouth daily at 6 PM. 30 tablet 0 09/20/2016 at Unknown time  . clopidogrel (PLAVIX) 75 MG tablet Take 0.5 tablets (37.5 mg total) by mouth daily. 30 tablet 1 09/21/2016 at 7a  . esomeprazole (NEXIUM) 40 MG capsule Take 40 mg by mouth at bedtime as needed (for acid reflux).    Past Week at Unknown time  . gabapentin (NEURONTIN) 100 MG capsule TAKE 1 CAPSULE BY MOUTH THREE TIMES DAILY 270 capsule 3 09/21/2016 at Unknown time  . gabapentin (NEURONTIN) 100 MG capsule Take 100 mg by mouth 2 (two) times daily.   09/21/2016 at Unknown time  . hydrochlorothiazide (HYDRODIURIL) 25 MG tablet Take 25 mg by mouth daily.   2 09/21/2016 at Unknown time  . loratadine (CLARITIN) 10  MG tablet Take 10 mg by mouth daily as needed for allergies.    10/27/2015 at Unknown time  . metFORMIN (GLUCOPHAGE) 500 MG tablet Take 1 tablet (500 mg total) by mouth 2 (two) times daily with a meal. (Patient taking differently: Take 500 mg by mouth daily with breakfast. ) 60 tablet 0 09/21/2016 at Unknown time  . methocarbamol (ROBAXIN) 500 MG tablet Take 1 tablet (500 mg total) by mouth 2 (two) times daily as needed for muscle spasms. 20 tablet 0   . nitroGLYCERIN (NITROSTAT) 0.4 MG SL tablet Place 1 tablet (0.4 mg total) under the tongue every 5 (five) minutes x 3 doses as needed for chest pain. 25 tablet 1 unk  . ramipril (ALTACE) 5 MG capsule Take 5 mg by mouth daily.  3 09/21/2016 at Unknown time  . tiotropium (SPIRIVA HANDIHALER) 18 MCG inhalation capsule  Place 1 capsule (18 mcg total) into inhaler and inhale every morning. (Patient taking differently: Place 18 mcg into inhaler and inhale as needed (for shortness of breath). ) 30 capsule 1 Past Month at Unknown time  . triamcinolone cream (KENALOG) 0.1 % Apply 1 application topically 2 (two) times daily.   Past Week at Unknown time    Assessment: 56 year old male presenting with chest pain found to have new onset atrial fibrillation with RVR. No anticoagulation prior to admission. Labs pending, but no bleeding reported. Pharmacy has been asked to start heparin infusion.    Goal of Therapy:  Heparin level 0.3-0.7 units/ml Monitor platelets by anticoagulation protocol: Yes   Plan:  Give 4000 units bolus x 1 Start heparin infusion at 1200 units/hr Check anti-Xa level in 6 hours and daily while on heparin Continue to monitor H&H and platelets  Ruben Imony Aava Deland, PharmD Clinical Pharmacist Pager: [phone] 12/17/2016 5:58 PM

## 2016-12-18 ENCOUNTER — Observation Stay (HOSPITAL_COMMUNITY): Payer: Commercial Managed Care - HMO | Admitting: Certified Registered Nurse Anesthetist

## 2016-12-18 ENCOUNTER — Encounter (HOSPITAL_COMMUNITY): Payer: Self-pay | Admitting: *Deleted

## 2016-12-18 ENCOUNTER — Observation Stay (HOSPITAL_COMMUNITY): Payer: Commercial Managed Care - HMO

## 2016-12-18 ENCOUNTER — Encounter (HOSPITAL_COMMUNITY)
Admission: AD | Disposition: A | Discharge status: Home / Self Care | Payer: Self-pay | Source: Ambulatory Visit | Attending: Cardiology

## 2016-12-18 DIAGNOSIS — E119 Type 2 diabetes mellitus without complications: Secondary | ICD-10-CM | POA: Diagnosis not present

## 2016-12-18 DIAGNOSIS — Z8249 Family history of ischemic heart disease and other diseases of the circulatory system: Secondary | ICD-10-CM | POA: Diagnosis not present

## 2016-12-18 DIAGNOSIS — R079 Chest pain, unspecified: Secondary | ICD-10-CM | POA: Diagnosis not present

## 2016-12-18 DIAGNOSIS — I639 Cerebral infarction, unspecified: Secondary | ICD-10-CM | POA: Diagnosis not present

## 2016-12-18 DIAGNOSIS — I10 Essential (primary) hypertension: Secondary | ICD-10-CM | POA: Diagnosis not present

## 2016-12-18 DIAGNOSIS — E1122 Type 2 diabetes mellitus with diabetic chronic kidney disease: Secondary | ICD-10-CM | POA: Diagnosis present

## 2016-12-18 DIAGNOSIS — E1151 Type 2 diabetes mellitus with diabetic peripheral angiopathy without gangrene: Secondary | ICD-10-CM | POA: Diagnosis present

## 2016-12-18 DIAGNOSIS — Z823 Family history of stroke: Secondary | ICD-10-CM | POA: Diagnosis not present

## 2016-12-18 DIAGNOSIS — J449 Chronic obstructive pulmonary disease, unspecified: Secondary | ICD-10-CM | POA: Diagnosis present

## 2016-12-18 DIAGNOSIS — N189 Chronic kidney disease, unspecified: Secondary | ICD-10-CM | POA: Diagnosis present

## 2016-12-18 DIAGNOSIS — Z832 Family history of diseases of the blood and blood-forming organs and certain disorders involving the immune mechanism: Secondary | ICD-10-CM | POA: Diagnosis not present

## 2016-12-18 DIAGNOSIS — K219 Gastro-esophageal reflux disease without esophagitis: Secondary | ICD-10-CM | POA: Diagnosis present

## 2016-12-18 DIAGNOSIS — Z87891 Personal history of nicotine dependence: Secondary | ICD-10-CM | POA: Diagnosis not present

## 2016-12-18 DIAGNOSIS — I129 Hypertensive chronic kidney disease with stage 1 through stage 4 chronic kidney disease, or unspecified chronic kidney disease: Secondary | ICD-10-CM | POA: Diagnosis present

## 2016-12-18 DIAGNOSIS — E785 Hyperlipidemia, unspecified: Secondary | ICD-10-CM | POA: Diagnosis present

## 2016-12-18 DIAGNOSIS — I4891 Unspecified atrial fibrillation: Secondary | ICD-10-CM | POA: Diagnosis present

## 2016-12-18 DIAGNOSIS — R0602 Shortness of breath: Secondary | ICD-10-CM | POA: Diagnosis present

## 2016-12-18 DIAGNOSIS — Z8673 Personal history of transient ischemic attack (TIA), and cerebral infarction without residual deficits: Secondary | ICD-10-CM | POA: Diagnosis not present

## 2016-12-18 HISTORY — PX: CARDIOVERSION: SHX1299

## 2016-12-18 HISTORY — PX: TEE WITHOUT CARDIOVERSION: SHX5443

## 2016-12-18 LAB — BASIC METABOLIC PANEL
ANION GAP: 7 (ref 5–15)
BUN: 9 mg/dL (ref 6–20)
CALCIUM: 8.8 mg/dL — AB (ref 8.9–10.3)
CO2: 26 mmol/L (ref 22–32)
Chloride: 106 mmol/L (ref 101–111)
Creatinine, Ser: 0.77 mg/dL (ref 0.61–1.24)
GFR calc Af Amer: >60 mL/min (ref >60)
Glucose, Bld: 119 mg/dL — ABNORMAL HIGH (ref 65–99)
Potassium: 3.9 mmol/L (ref 3.5–5.1)
Sodium: 139 mmol/L (ref 135–145)

## 2016-12-18 LAB — LIPID PANEL
Cholesterol: 129 mg/dL (ref 0–200)
HDL: 37 mg/dL — ABNORMAL LOW (ref >40)
LDL CALC: 79 mg/dL (ref 0–99)
Total CHOL/HDL Ratio: 3.5 RATIO
Triglycerides: 66 mg/dL (ref <150)
VLDL: 13 mg/dL (ref 0–40)

## 2016-12-18 LAB — PROTIME-INR
INR: 1.02
Prothrombin Time: 13.4 seconds (ref 11.4–15.2)

## 2016-12-18 LAB — HIV ANTIBODY (ROUTINE TESTING W REFLEX): HIV Screen 4th Generation wRfx: NONREACTIVE

## 2016-12-18 LAB — HEPARIN LEVEL (UNFRACTIONATED)
HEPARIN UNFRACTIONATED: 0.33 [IU]/mL (ref 0.30–0.70)
Heparin Unfractionated: 0.21 IU/mL — ABNORMAL LOW (ref 0.30–0.70)
Heparin Unfractionated: 0.36 IU/mL (ref 0.30–0.70)

## 2016-12-18 LAB — HEMOGLOBIN A1C
Hgb A1c MFr Bld: 5.5 % (ref 4.8–5.6)
MEAN PLASMA GLUCOSE: 111 mg/dL

## 2016-12-18 LAB — GLUCOSE, CAPILLARY
GLUCOSE-CAPILLARY: 119 mg/dL — AB (ref 65–99)
Glucose-Capillary: 99 mg/dL (ref 65–99)
Glucose-Capillary: 126 mg/dL — ABNORMAL HIGH (ref 65–99)
Glucose-Capillary: 86 mg/dL (ref 65–99)
Glucose-Capillary: 120 mg/dL — ABNORMAL HIGH (ref 65–99)

## 2016-12-18 LAB — TROPONIN I: Troponin I: <0.03 ng/mL (ref <0.03)

## 2016-12-18 SURGERY — ECHOCARDIOGRAM, TRANSESOPHAGEAL
Anesthesia: General

## 2016-12-18 MED ORDER — LACTATED RINGERS IV SOLN
INTRAVENOUS | Status: DC | PRN
  Administered 2016-12-18: 16:00:00 via INTRAVENOUS

## 2016-12-18 MED ORDER — EPHEDRINE 5 MG/ML INJ
INTRAVENOUS | Status: AC
  Filled 2016-12-18: qty 20

## 2016-12-18 MED ORDER — BUTAMBEN-TETRACAINE-BENZOCAINE 2-2-14 % EX AERO
INHALATION_SPRAY | CUTANEOUS | Status: DC | PRN
  Administered 2016-12-18: 1 via TOPICAL

## 2016-12-18 MED ORDER — PHENYLEPHRINE 40 MCG/ML (10ML) SYRINGE FOR IV PUSH (FOR BLOOD PRESSURE SUPPORT)
PREFILLED_SYRINGE | INTRAVENOUS | Status: AC
  Filled 2016-12-18: qty 20

## 2016-12-18 MED ORDER — PHENYLEPHRINE HCL 10 MG/ML IJ SOLN
INTRAMUSCULAR | Status: DC | PRN
  Administered 2016-12-18 (×3): 40 ug via INTRAVENOUS

## 2016-12-18 MED ORDER — SODIUM CHLORIDE 0.9 % IV SOLN: INTRAVENOUS | Status: DC

## 2016-12-18 MED ORDER — PROPOFOL 10 MG/ML IV BOLUS
INTRAVENOUS | Status: DC | PRN
  Administered 2016-12-18 (×2): 20 mg via INTRAVENOUS

## 2016-12-18 MED ORDER — APIXABAN 5 MG PO TABS
5.0000 mg | ORAL_TABLET | Freq: Two times a day (BID) | ORAL | Status: DC
  Administered 2016-12-18 – 2016-12-19 (×3): 5 mg via ORAL
  Filled 2016-12-18 (×3): qty 1

## 2016-12-18 MED ORDER — PROPOFOL 500 MG/50ML IV EMUL
INTRAVENOUS | Status: DC | PRN
  Administered 2016-12-18: 75 ug/kg/min via INTRAVENOUS

## 2016-12-18 NOTE — Progress Notes (Signed)
ANTICOAGULATION CONSULT NOTE  Pharmacy Consult for heparin to Eliquis Indication: atrial fibrillation  Allergies  Allergen Reactions  . Tylox [Oxycodone-Acetaminophen] Hives, Other (See Comments) and Rash    Sweating, shaking Reaction: Tremors and diaphoresis   . Adhesive [Tape] Rash   Patient Measurements: Height: 5\' 10"  (177.8 cm) Weight: 253 lb 4.8 oz (114.9 kg) IBW/kg (Calculated) : 73 Heparin Dosing Weight: 94 Kg  Vital Signs: Temp: 97.8 F (36.6 C) (03/09 1651) Temp Source: Oral (03/09 1651) BP: 93/69 (03/09 1754) Pulse Rate: 59 (03/09 1754) Labs:  Recent Labs  12/17/16 1817 12/17/16 2340 12/17/16 2346 12/18/16 0702 12/18/16 1018  HGB 13.6  --   --   --   --   HCT 40.4  --   --   --   --   PLT 218  --   --   --   --   LABPROT  --   --   --   --  13.4  INR  --   --   --   --  1.02  HEPARINUNFRC  --   --  0.36 0.21*  --   CREATININE 0.92  --   --   --  0.77  TROPONINI <0.03 <0.03  --  <0.03  --     Estimated Creatinine Clearance: 134.1 mL/min (by C-G formula based on SCr of 0.77 mg/dL).  Assessment: 56 year old male presenting with chest pain found to have new onset atrial fibrillation with RVR. No anticoagulation prior to admission. Pharmacy consulted to switch from heparin to Eliquis  Goal of Therapy:  Heparin level 0.3-0.7 units/ml Monitor platelets by anticoagulation protocol: Yes   Plan:  Start Eliquis 5mg  PO BID Stop heparin gtt once first dose of Eliquis given Monitor CBC, s/s of bleed  Enzo BiNathan Chao Blazejewski, PharmD, [facility] Clinical Pharmacist Pager [phone] 12/18/2016 6:32 PM

## 2016-12-18 NOTE — Anesthesia Preprocedure Evaluation (Signed)
Anesthesia Evaluation  Patient identified by MRN, date of birth, ID band Patient awake    History of Anesthesia Complications (+) PONV  Airway Mallampati: II  TM Distance: >3 FB     Dental   Pulmonary asthma , sleep apnea , COPD, former smoker,    breath sounds clear to auscultation       Cardiovascular hypertension, + Peripheral Vascular Disease   Rhythm:Regular Rate:Normal     Neuro/Psych  Headaches,    GI/Hepatic Neg liver ROS, hiatal hernia, GERD  ,  Endo/Other  diabetes  Renal/GU Renal disease     Musculoskeletal   Abdominal   Peds  Hematology   Anesthesia Other Findings   Reproductive/Obstetrics                             Anesthesia Physical Anesthesia Plan  ASA: III  Anesthesia Plan: General   Post-op Pain Management:    Induction: Intravenous  Airway Management Planned: Simple Face Mask and Mask  Additional Equipment:   Intra-op Plan:   Post-operative Plan:   Informed Consent: I have reviewed the patients History and Physical, chart, labs and discussed the procedure including the risks, benefits and alternatives for the proposed anesthesia with the patient or authorized representative who has indicated his/her understanding and acceptance.   Dental advisory given  Plan Discussed with: CRNA and Anesthesiologist  Anesthesia Plan Comments:         Anesthesia Quick Evaluation

## 2016-12-18 NOTE — CV Procedure (Signed)
PRE-OP DIAGNOSIS:  Atrial fibrillation with RVR.  POST-OP DIAGNOSIS:  Sinus rhythm.  OPERATOR:  Orpah CobbAjay Markcus Lazenby.     ANESTHESIA:  Propafol.  COMPLICATIONS:  None.   OPERATIVE TERM:  DC cardioversion.  The nature of the procedure, risks and alternatives were discussed with the patient who gave informed consent.  OPERATIVE TECHNIQUE:  The patient was sedated with Propafol by nurse anesthetist.  When the patient was no longer responsive to quiet voice, DC cardioversion was performed with 120 J biphasically and synchronously.  He converted to sinus rhythm.  The patient was then monitored until fully alert and left the procedure area in stable condition.  IMPRESSION:  Successful DC cardioversion.

## 2016-12-18 NOTE — Transfer of Care (Signed)
Immediate Anesthesia Transfer of Care Note  Patient: [name]  Procedure(s) Performed: Procedure(s): TRANSESOPHAGEAL ECHOCARDIOGRAM (TEE) (N/A) CARDIOVERSION (N/A)  Patient Location: PACU  Anesthesia Type:MAC  Level of Consciousness: awake and alert   Airway & Oxygen Therapy: Patient Spontanous Breathing  Post-op Assessment: Report given to RN, Post -op Vital signs reviewed and stable and Patient moving all extremities X 4  Post vital signs: Reviewed and stable  Last Vitals:  Vitals:   12/18/16 1503 12/18/16 1651  BP: 118/77 (!) 90/39  Pulse: 94 (!) 54  Resp: 18 (!) 22  Temp: 36.7 C 36.6 C    Last Pain:  Vitals:   12/18/16 1651  TempSrc: Oral  PainSc:       Patients Stated Pain Goal: 0 (19/41/74 0814)  Complications: No apparent anesthesia complications

## 2016-12-18 NOTE — CV Procedure (Signed)
INDICATIONS:   The patient is 56 year old male with new onset of atrial fibrillation with RVR.  PROCEDURE:  Informed consent was discussed including risks, benefits and alternatives for the procedure.  Risks include, but are not limited to, cough, sore throat, vomiting, nausea, somnolence, esophageal and stomach trauma or perforation, bleeding, low blood pressure, aspiration, pneumonia, infection, trauma to the teeth and death.    Patient was given sedation.  The oropharynx was anesthetized with topical lidocaine.  The transesophageal probe was inserted in the esophagus and stomach and multiple views were obtained.  Agitated saline was used after the transesophageal probe was removed from the body.  The patient was kept under observation until the patient left the procedure room.  The patient left the procedure room in stable condition.   COMPLICATIONS:  There were no immediate complications.  FINDINGS:  1. LEFT VENTRICLE: The left ventricle is normal in structure and function.  Wall motion is normal.  No thrombus or masses seen in the left ventricle.  2. RIGHT VENTRICLE:  The right ventricle is normal in structure and function without any thrombus or masses.    3. LEFT ATRIUM:  The left atrium is normal without any thrombus or masses.  4. LEFT ATRIAL APPENDAGE:  The left atrial appendage is free of any thrombus or masses.  5. RIGHT ATRIUM:  The right atrium is free of any thrombus or masses.    6. ATRIAL SEPTUM:  The atrial septum is normal with PFO, no ASD.  7. MITRAL VALVE:  The mitral valve is normal in structure and function with mild regurgitation, masses, stenosis or vegetations.  8. TRICUSPID VALVE:  The tricuspid valve is normal in structure and function without regurgitation, masses, stenosis or vegetations.  9. AORTIC VALVE:  The aortic valve is normal in structure and function without regurgitation, masses, stenosis or vegetations.  10. PULMONIC VALVE:  The pulmonic valve is  normal in structure and function without regurgitation, masses, stenosis or vegetations.  11. AORTIC ARCH, ASCENDING AND DESCENDING AORTA:  The aorta had mild atherosclerosis in the ascending or descending aorta.  The aortic arch was also atherosclerotic.  12.  Superior Vena Cava : No thrombus or catheter.  13.  Pulmonary Veins: Visible.  14.  Pulmonary artery: visible and normal.   IMPRESSION:   1. Normal LV systolic function. 2. PFO by saline injection. 3. No clot in LA or appendage  RECOMMENDATIONS:    Patient underwent 120 J biphasic shock x one with successful cardioversion. IV amiodarone resumed.

## 2016-12-18 NOTE — Progress Notes (Signed)
  Echocardiogram Echocardiogram Transesophageal has been performed.  Delcie RochENNINGTON, Mathias Bogacki 12/18/2016, 4:53 PM

## 2016-12-18 NOTE — Progress Notes (Signed)
Subjective:  Patient denies any chest pain or shortness of breath. Remains in A. fib with moderate ventricular response.   Objective:  Vital Signs in the last 24 hours: Temp:  [97.6 F (36.4 C)-97.9 F (36.6 C)] 97.6 F (36.4 C) (03/09 0532) Pulse Rate:  [55-131] 92 (03/09 0557) Resp:  [15-21] 15 (03/09 0532) BP: (96-115)/(61-77) 100/72 (03/09 0532) SpO2:  [95 %-100 %] 95 % (03/09 0532) Weight:  [252 lb (114.3 kg)-253 lb 4.8 oz (114.9 kg)] 253 lb 4.8 oz (114.9 kg) (03/09 0532)  Intake/Output from previous day: 03/08 0701 - 03/09 0700 In: 1798.7 [P.O.:800; I.V.:998.7] Out: 1350 [Urine:1350] Intake/Output from this shift: Total I/O In: -  Out: 450 [Urine:450]  Physical Exam: Neck: no adenopathy, no carotid bruit, no JVD and supple, symmetrical, trachea midline Lungs: clear to auscultation bilaterally Heart: irregularly irregular rhythm, S1, S2 normal and Soft systolic murmur noted Abdomen: soft, non-tender; bowel sounds normal; no masses,  no organomegaly Extremities: extremities normal, atraumatic, no cyanosis or edema  Lab Results:  Recent Labs  12/17/16 1817  WBC 7.2  HGB 13.6  PLT 218    Recent Labs  12/17/16 1817 12/18/16 1018  NA 138 139  K 3.6 3.9  CL 102 106  CO2 28 26  GLUCOSE 116* 119*  BUN 11 9  CREATININE 0.92 0.77    Recent Labs  12/17/16 2340 12/18/16 0702  TROPONINI <0.03 <0.03   Hepatic Function Panel  Recent Labs  12/17/16 1817  PROT 6.3*  ALBUMIN 3.4*  AST 19  ALT 20  ALKPHOS 52  BILITOT 0.5    Recent Labs  12/18/16 0702  CHOL 129   No results for input(s): PROTIME in the last 72 hours.  Imaging: Imaging results have been reviewed and No results found.  Cardiac Studies:  Assessment/Plan:  Status post atypical chest pain MI ruled out New-onset A. 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 Morbid  obesity Tobacco abuse Hyperlipidemia Plan Discussed with patient various options of treatment agrees for TEE cardioversion later today. Will switch heparin to Eliquis.   LOS: 1 day    Donyel Castagnola 12/18/2016, 12:26 PM    

## 2016-12-18 NOTE — Anesthesia Postprocedure Evaluation (Signed)
Anesthesia Post Note  Patient: [name]  Procedure(s) Performed: Procedure(s) (LRB): TRANSESOPHAGEAL ECHOCARDIOGRAM (TEE) (N/A) CARDIOVERSION (N/A)  Patient location during evaluation: Endoscopy Anesthesia Type: General Level of consciousness: awake Pain management: pain level controlled Respiratory status: spontaneous breathing Cardiovascular status: stable Anesthetic complications: no       Last Vitals:  Vitals:   12/18/16 1503 12/18/16 1651  BP: 118/77 (!) 90/39  Pulse: 94 (!) 54  Resp: 18 (!) 22  Temp: 36.7 C 36.6 C    Last Pain:  Vitals:   12/18/16 1651  TempSrc: Oral  PainSc:                  Taiya Nutting

## 2016-12-18 NOTE — Progress Notes (Signed)
ANTICOAGULATION CONSULT NOTE  Pharmacy Consult for heparin Indication: chest pain/ACS  Allergies  Allergen Reactions  . Tylox [Oxycodone-Acetaminophen] Hives, Other (See Comments) and Rash    Sweating, shaking Reaction: Tremors and diaphoresis   . Adhesive [Tape] Rash   Patient Measurements: Weight: 253 lb 4.8 oz (114.9 kg) Heparin Dosing Weight: 94 Kg  Vital Signs: Temp: 97.6 F (36.4 C) (03/09 0532) Temp Source: Oral (03/09 0532) BP: 100/72 (03/09 0532) Pulse Rate: 92 (03/09 0557) Labs:  Recent Labs  12/17/16 1817 12/17/16 2340 12/17/16 2346 12/18/16 0702  HGB 13.6  --   --   --   HCT 40.4  --   --   --   PLT 218  --   --   --   HEPARINUNFRC  --   --  0.36 0.21*  CREATININE 0.92  --   --   --   TROPONINI <0.03 <0.03  --  <0.03    Estimated Creatinine Clearance: 113 mL/min (by C-G formula based on SCr of 0.92 mg/dL).  Assessment: 56 year old male presenting with chest pain found to have new onset atrial fibrillation with RVR. No anticoagulation prior to admission.   Confirmatory heparin level this morning is low at 0.21, cbc not drawn this am as ordered, but normal on admission. No bleeding issues noted overnight.   Goal of Therapy:  Heparin level 0.3-0.7 units/ml Monitor platelets by anticoagulation protocol: Yes   Plan:  Increase heparin infusion to 1400 units/hr Check anti-Xa level in 6 hours and daily while on heparin Continue to monitor H&H and platelets  Sheppard CoilFrank Darragh Nay PharmD., BCPS Clinical Pharmacist Pager [phone] 12/18/2016 8:58 AM

## 2016-12-18 NOTE — H&P (View-Only) (Signed)
Subjective:  Patient denies any chest pain or shortness of breath. Remains in A. fib with moderate ventricular response.   Objective:  Vital Signs in the last 24 hours: Temp:  [97.6 F (36.4 C)-97.9 F (36.6 C)] 97.6 F (36.4 C) (03/09 0532) Pulse Rate:  [55-131] 92 (03/09 0557) Resp:  [15-21] 15 (03/09 0532) BP: (96-115)/(61-77) 100/72 (03/09 0532) SpO2:  [95 %-100 %] 95 % (03/09 0532) Weight:  [252 lb (114.3 kg)-253 lb 4.8 oz (114.9 kg)] 253 lb 4.8 oz (114.9 kg) (03/09 0532)  Intake/Output from previous day: 03/08 0701 - 03/09 0700 In: 1798.7 [P.O.:800; I.V.:998.7] Out: 1350 [Urine:1350] Intake/Output from this shift: Total I/O In: -  Out: 450 [Urine:450]  Physical Exam: Neck: no adenopathy, no carotid bruit, no JVD and supple, symmetrical, trachea midline Lungs: clear to auscultation bilaterally Heart: irregularly irregular rhythm, S1, S2 normal and Soft systolic murmur noted Abdomen: soft, non-tender; bowel sounds normal; no masses,  no organomegaly Extremities: extremities normal, atraumatic, no cyanosis or edema  Lab Results:  Recent Labs  12/17/16 1817  WBC 7.2  HGB 13.6  PLT 218    Recent Labs  12/17/16 1817 12/18/16 1018  NA 138 139  K 3.6 3.9  CL 102 106  CO2 28 26  GLUCOSE 116* 119*  BUN 11 9  CREATININE 0.92 0.77    Recent Labs  12/17/16 2340 12/18/16 0702  TROPONINI <0.03 <0.03   Hepatic Function Panel  Recent Labs  12/17/16 1817  PROT 6.3*  ALBUMIN 3.4*  AST 19  ALT 20  ALKPHOS 52  BILITOT 0.5    Recent Labs  12/18/16 0702  CHOL 129   No results for input(s): PROTIME in the last 72 hours.  Imaging: Imaging results have been reviewed and No results found.  Cardiac Studies:  Assessment/Plan:  Status post atypical chest pain MI ruled out New-onset A. 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 Morbid  obesity Tobacco abuse Hyperlipidemia Plan Discussed with patient various options of treatment agrees for TEE cardioversion later today. Will switch heparin to Eliquis.   LOS: 1 day    Rinaldo CloudHarwani, Sriman Tally 12/18/2016, 12:26 PM

## 2016-12-18 NOTE — Progress Notes (Signed)
ANTICOAGULATION CONSULT NOTE - Follow Up Consult  Pharmacy Consult for heparin Indication: chest pain/ACS and new atrial fibrillation  Labs:  Recent Labs  12/17/16 1817 12/17/16 2340 12/17/16 2346  HGB 13.6  --   --   HCT 40.4  --   --   PLT 218  --   --   HEPARINUNFRC  --   --  0.36  CREATININE 0.92  --   --   TROPONINI <0.03 <0.03  --     Assessment/Plan:  56yo male therapeutic on heparin with initial dosing for CP and new Afib though was started several hours late thus bolus is likely still affecting. Will continue gtt at current rate and check additional level.   Vernard GamblesVeronda Bruin Bolger, PharmD, BCPS  12/18/2016,1:03 AM

## 2016-12-18 NOTE — Interval H&P Note (Signed)
History and Physical Interval Note:  12/18/2016 4:22 PM  [name]  has presented today for surgery, with the diagnosis of Atrial fibrillation  The various methods of treatment have been discussed with the patient and family. After consideration of risks, benefits and other options for treatment, the patient has consented to  Procedure(s): TRANSESOPHAGEAL ECHOCARDIOGRAM (TEE) (N/A) CARDIOVERSION (N/A) as a surgical intervention .  The patient's history has been reviewed, patient examined, no change in status, stable for surgery.  I have reviewed the patient's chart and labs.  Questions were answered to the patient's satisfaction.     Tamari Busic S

## 2016-12-19 LAB — CBC
HCT: 38.6 % — ABNORMAL LOW (ref 39.0–52.0)
HEMOGLOBIN: 12.8 g/dL — AB (ref 13.0–17.0)
MCH: 29.1 pg (ref 26.0–34.0)
MCHC: 33.2 g/dL (ref 30.0–36.0)
MCV: 87.7 fL (ref 78.0–100.0)
Platelets: 183 10*3/uL (ref 150–400)
RBC: 4.4 MIL/uL (ref 4.22–5.81)
RDW: 12.7 % (ref 11.5–15.5)
WBC: 7.2 10*3/uL (ref 4.0–10.5)

## 2016-12-19 LAB — GLUCOSE, CAPILLARY
GLUCOSE-CAPILLARY: 128 mg/dL — AB (ref 65–99)
Glucose-Capillary: 96 mg/dL (ref 65–99)
Glucose-Capillary: 114 mg/dL — ABNORMAL HIGH (ref 65–99)
Glucose-Capillary: 85 mg/dL (ref 65–99)

## 2016-12-19 MED ORDER — HYDROCHLOROTHIAZIDE 25 MG PO TABS: 12.5000 mg | ORAL_TABLET | Freq: Every day | ORAL | Status: DC

## 2016-12-19 MED ORDER — APIXABAN 5 MG PO TABS: 5.0000 mg | ORAL_TABLET | Freq: Two times a day (BID) | ORAL | 3 refills | Status: DC

## 2016-12-19 MED ORDER — AMIODARONE HCL 200 MG PO TABS
200.0000 mg | ORAL_TABLET | Freq: Every day | ORAL | Status: DC
  Administered 2016-12-19: 200 mg via ORAL
  Filled 2016-12-19: qty 1

## 2016-12-19 MED ORDER — AMIODARONE HCL 200 MG PO TABS: 200.0000 mg | ORAL_TABLET | Freq: Every day | ORAL | 1 refills | Status: DC

## 2016-12-19 NOTE — Progress Notes (Signed)
Pt received discharge information. Pt acknowledged understanding. Pt IV removed. Pt Discharged and sister waiting at parking for him.

## 2016-12-19 NOTE — Care Management Note (Signed)
Case Management Note  Patient Details  Name: Ivan ClontsRichard L [name] MRN: [id number] Date of Birth: 07/23/1961  Subjective/Objective:                 Independent patient from home. Spoke with patient at the bedside he denies assistance or resources fropm CM. Patient [place] covered with Medicaid, will be $3-4.00. No Coupon required.    Action/Plan:  No CM needs identified at this time. Anticipate DC to home self care.  Expected Discharge Date:                  Expected Discharge Plan:  Home/Self Care  In-House Referral:     Discharge planning Services  CM Consult  Post Acute Care Choice:    Choice offered to:     DME Arranged:    DME Agency:     HH Arranged:    HH Agency:     Status of Service:  Completed, signed off  If discussed at [employer] Length of Stay Meetings, dates discussed:    Additional Comments:  Lawerance SabalDebbie Alle Difabio, RN 12/19/2016, 9:28 AM

## 2016-12-19 NOTE — Discharge Instructions (Signed)
Apixaban oral tablets °What is this medicine? °APIXABAN (a PIX a ban) is an anticoagulant (blood thinner). It is used to lower the chance of stroke in people with a medical condition called atrial fibrillation. It is also used to treat or prevent blood clots in the lungs or in the veins. °This medicine may be used for other purposes; ask your health care provider or pharmacist if you have questions. °COMMON BRAND NAME(S): Eliquis °What should I tell my health care provider before I take this medicine? °They need to know if you have any of these conditions: °-bleeding disorders °-bleeding in the brain °-blood in your stools (black or tarry stools) or if you have blood in your vomit °-history of stomach bleeding °-kidney disease °-liver disease °-mechanical heart valve °-an unusual or allergic reaction to apixaban, other medicines, foods, dyes, or preservatives °-pregnant or trying to get pregnant °-breast-feeding °How should I use this medicine? °Take this medicine by mouth with a glass of water. Follow the directions on the prescription label. You can take it with or without food. If it upsets your stomach, take it with food. Take your medicine at regular intervals. Do not take it more often than directed. Do not stop taking except on your doctor's advice. Stopping this medicine may increase your risk of a blot clot. Be sure to refill your prescription before you run out of medicine. °Talk to your pediatrician regarding the use of this medicine in children. Special care may be needed. °Overdosage: If you think you have taken too much of this medicine contact a poison control center or emergency room at once. °NOTE: This medicine is only for you. Do not share this medicine with others. °What if I miss a dose? °If you miss a dose, take it as soon as you can. If it is almost time for your next dose, take only that dose. Do not take double or extra doses. °What may interact with this medicine? °This medicine may  interact with the following: °-aspirin and aspirin-like medicines °-certain medicines for fungal infections like ketoconazole and itraconazole °-certain medicines for seizures like carbamazepine and phenytoin °-certain medicines that treat or prevent blood clots like warfarin, enoxaparin, and dalteparin °-clarithromycin °-NSAIDs, medicines for pain and inflammation, like ibuprofen or naproxen °-rifampin °-ritonavir °-St. John's wort °This list may not describe all possible interactions. Give your health care provider a list of all the medicines, herbs, non-prescription drugs, or dietary supplements you use. Also tell them if you smoke, drink alcohol, or use illegal drugs. Some items may interact with your medicine. °What should I watch for while using this medicine? °Visit your doctor or health care professional for regular checks on your progress. °Notify your doctor or health care professional and seek emergency treatment if you develop breathing problems; changes in vision; chest pain; severe, sudden headache; pain, swelling, warmth in the leg; trouble speaking; sudden numbness or weakness of the face, arm or leg. These can be signs that your condition has gotten worse. °If you are going to have surgery or other procedure, tell your doctor that you are taking this medicine. °What side effects may I notice from receiving this medicine? °Side effects that you should report to your doctor or health care professional as soon as possible: °-allergic reactions like skin rash, itching or hives, swelling of the face, lips, or tongue °-signs and symptoms of bleeding such as bloody or black, tarry stools; red or dark-brown urine; spitting up blood or brown material that looks like coffee   grounds; red spots on the skin; unusual bruising or bleeding from the eye, gums, or nose °This list may not describe all possible side effects. Call your doctor for medical advice about side effects. You may report side effects to FDA at  1-800-FDA-1088. °Where should I keep my medicine? °Keep out of the reach of children. °Store at room temperature between 20 and 25 degrees C (68 and 77 degrees F). Throw away any unused medicine after the expiration date. °NOTE: This sheet is a summary. It may not cover all possible information. If you have questions about this medicine, talk to your doctor, pharmacist, or health care provider. °© 2018 [employer] (2016-04-20 11:54:23) °  ° °

## 2016-12-19 NOTE — Discharge Summary (Signed)
Physician Discharge Summary  Patient ID: Mariea ClontsRichard L Shamoon MRN: [id number] DOB/AGE: 55/02/1961 55 y.o.  Admit date: 12/17/2016 Discharge date: 12/19/2016  Admission Diagnoses: Atypical chest pain New onset atrial fibrillation, CHA2DS2VASc score of 4 Hypertension DM, II H/O CVA with cerebral aneurysm S/P stent and coil placement for above aneurysm Obesity Tobacco use disorder Hyperlipidemia  Discharge Diagnoses:  Principle Problem: * Atrial fibrillation, new onset with RVR (HCC) * Active problems:   Hypertension   DM, II   Obesity   Tobacco use disorder   Hyperlipidemia   Chest pain   Mitral regurgitation   Sinus bradycardia  Discharged Condition: fair  Hospital Course: 56 year old male with multiple medical conditions came with vague chest pain and palpitation. EKG in office showed atrial fibrillation with RVR. Patient was placed on IV heparin and amiodarone. He underwent TEE followed by successful external cardioversion. He remained in sinus rhythm. Metoprolol for discontinued due to sinus bradycardia and Eliquis was started. He will see Primary care doctor and Dr. Sharyn LullHarwani in 1 week or as arranged.  Consults: cardiology  Significant Diagnostic Studies: labs: CBC was normal. BMET was normal except blood sugar of 116 mg/dL. Lipid panel near normal with slightly low HDL of 37 mg/dL and LDL of 79 mg/dL. Troponin-I normal x 3. Hgb A1C of 5.5 % TSH 0.83  EKG-Atrial fibrillation, incomplete RBBB, LVH and nonspecific T wave changes.  Post cardioversion EKG: Sinus bradycardia and prolonged QTc.  TEE: Preserved LV systolic function. Mild MR and PFO.  Treatments: cardiac meds: ramipril (Altace), amiodarone, aspirin, Plavix and Eliquis.  Discharge Exam: Blood pressure (!) 112/55, pulse (!) 57, temperature 97.5 F (36.4 C), temperature source Oral, resp. rate 19, height 5\' 10"  (1.778 m), weight 114.9 kg (253 lb 4.8 oz), SpO2 98 %. General appearance: alert, cooperative and appears  stated age Head: Normocephalic, atraumatic. Eyes: conjunctivae/corneas clear. PERRL, EOM's intact.  Neck: no adenopathy, no carotid bruit, no JVD, supple, symmetrical, trachea midline and thyroid not enlarged. Resp: clear to auscultation bilaterally. Cardio: regular rate and rhythm, S1, S2 normal, II/VI systolic murmur, click, rub or gallop GI: soft, non-tender; bowel sounds normal; no masses,  no organomegaly Extremities: extremities normal, no cyanosis or edema Skin: Warm and dry.  Neurologic: Alert and oriented X 3, normal strength and tone. Normal coordination and gait.  Disposition: 01-Home or Self Care   Allergies as of 12/19/2016      Reactions   Tylox [oxycodone-acetaminophen] Hives, Other (See Comments), Rash   Sweating, shaking Reaction: Tremors and diaphoresis    Adhesive [tape] Rash      Medication List    STOP taking these medications   loratadine 10 MG tablet Commonly known as:  CLARITIN   methocarbamol 500 MG tablet Commonly known as:  ROBAXIN   tiotropium 18 MCG inhalation capsule Commonly known as:  SPIRIVA HANDIHALER     TAKE these medications   acetaminophen 500 MG tablet Commonly known as:  TYLENOL Take 1 tablet (500 mg total) by mouth every 6 (six) hours as needed. What changed:  reasons to take this   albuterol 108 (90 Base) MCG/ACT inhaler Commonly known as:  PROVENTIL HFA;VENTOLIN HFA Inhale 2 puffs into the lungs every 6 (six) hours as needed for wheezing or shortness of breath.   amiodarone 200 MG tablet Commonly known as:  PACERONE Take 1 tablet (200 mg total) by mouth daily. Start taking on:  12/20/2016   ANORO ELLIPTA 62.5-25 MCG/INH Aepb Generic drug:  umeclidinium-vilanterol Inhale 1 puff into the  lungs daily.   apixaban 5 MG Tabs tablet Commonly known as:  ELIQUIS Take 1 tablet (5 mg total) by mouth 2 (two) times daily.   aspirin EC 81 MG tablet Take 81 mg by mouth every other day.   atorvastatin 20 MG tablet Commonly known  as:  LIPITOR Take 1 tablet (20 mg total) by mouth daily at 6 PM.   clopidogrel 75 MG tablet Commonly known as:  PLAVIX Take 0.5 tablets (37.5 mg total) by mouth daily. What changed:  when to take this   cyclobenzaprine 5 MG tablet Commonly known as:  FLEXERIL Take 5 mg by mouth at bedtime.   esomeprazole 40 MG capsule Commonly known as:  NEXIUM Take 40 mg by mouth at bedtime as needed (for acid reflux).   gabapentin 100 MG capsule Commonly known as:  NEURONTIN TAKE 1 CAPSULE BY MOUTH THREE TIMES DAILY   hydrochlorothiazide 25 MG tablet Commonly known as:  HYDRODIURIL Take 0.5 tablets (12.5 mg total) by mouth daily. What changed:  how much to take   metFORMIN 500 MG tablet Commonly known as:  GLUCOPHAGE Take 1 tablet (500 mg total) by mouth 2 (two) times daily with a meal. What changed:  when to take this   nitroGLYCERIN 0.4 MG SL tablet Commonly known as:  NITROSTAT Place 1 tablet (0.4 mg total) under the tongue every 5 (five) minutes x 3 doses as needed for chest pain.   ramipril 5 MG capsule Commonly known as:  ALTACE Take 5 mg by mouth daily.      Follow-up Information    BOUSKA,[name], MD. Schedule an appointment as soon as possible for a visit in 1 week(s).   Specialty:  Family Medicine Contact information: [address] [place] [address] [phone]        Rinaldo Cloud, MD. Schedule an appointment as soon as possible for a visit in 1 week(s).   Specialty:  Cardiology Contact information: [address] [place] [address] [phone]           Signed: Ricki Rodriguez 12/19/2016, 7:52 PM

## 2016-12-20 ENCOUNTER — Encounter (HOSPITAL_COMMUNITY): Payer: Self-pay | Admitting: Cardiovascular Disease

## 2016-12-22 DIAGNOSIS — E118 Type 2 diabetes mellitus with unspecified complications: Secondary | ICD-10-CM | POA: Diagnosis not present

## 2016-12-22 DIAGNOSIS — Z125 Encounter for screening for malignant neoplasm of prostate: Secondary | ICD-10-CM | POA: Diagnosis not present

## 2016-12-22 DIAGNOSIS — E7800 Pure hypercholesterolemia, unspecified: Secondary | ICD-10-CM | POA: Diagnosis not present

## 2016-12-22 DIAGNOSIS — Z6838 Body mass index (BMI) 38.0-38.9, adult: Secondary | ICD-10-CM | POA: Diagnosis not present

## 2016-12-23 DIAGNOSIS — E119 Type 2 diabetes mellitus without complications: Secondary | ICD-10-CM | POA: Diagnosis not present

## 2016-12-23 DIAGNOSIS — I639 Cerebral infarction, unspecified: Secondary | ICD-10-CM | POA: Diagnosis not present

## 2016-12-23 DIAGNOSIS — I10 Essential (primary) hypertension: Secondary | ICD-10-CM | POA: Diagnosis not present

## 2016-12-23 DIAGNOSIS — I480 Paroxysmal atrial fibrillation: Secondary | ICD-10-CM | POA: Diagnosis not present

## 2016-12-23 DIAGNOSIS — E785 Hyperlipidemia, unspecified: Secondary | ICD-10-CM | POA: Diagnosis not present

## 2016-12-23 DIAGNOSIS — R0789 Other chest pain: Secondary | ICD-10-CM | POA: Diagnosis not present

## 2016-12-23 DIAGNOSIS — F17290 Nicotine dependence, other tobacco product, uncomplicated: Secondary | ICD-10-CM | POA: Diagnosis not present

## 2016-12-23 LAB — ECHOCARDIOGRAM COMPLETE
Height: 70 in
Weight: 4052.8 oz

## 2017-01-04 DIAGNOSIS — Z6838 Body mass index (BMI) 38.0-38.9, adult: Secondary | ICD-10-CM | POA: Diagnosis not present

## 2017-01-04 DIAGNOSIS — G8929 Other chronic pain: Secondary | ICD-10-CM | POA: Diagnosis not present

## 2017-01-04 DIAGNOSIS — M545 Low back pain: Secondary | ICD-10-CM | POA: Diagnosis not present

## 2017-01-12 ENCOUNTER — Emergency Department (HOSPITAL_COMMUNITY): Payer: Medicare Other

## 2017-01-12 ENCOUNTER — Encounter (HOSPITAL_COMMUNITY): Payer: Self-pay

## 2017-01-12 ENCOUNTER — Emergency Department (HOSPITAL_COMMUNITY)
Admission: EM | Admit: 2017-01-12 | Discharge: 2017-01-12 | Disposition: A | Discharge status: Home / Self Care | Payer: Medicare Other | Attending: Emergency Medicine | Admitting: Emergency Medicine

## 2017-01-12 DIAGNOSIS — E1122 Type 2 diabetes mellitus with diabetic chronic kidney disease: Secondary | ICD-10-CM | POA: Insufficient documentation

## 2017-01-12 DIAGNOSIS — J449 Chronic obstructive pulmonary disease, unspecified: Secondary | ICD-10-CM | POA: Insufficient documentation

## 2017-01-12 DIAGNOSIS — I4891 Unspecified atrial fibrillation: Secondary | ICD-10-CM | POA: Insufficient documentation

## 2017-01-12 DIAGNOSIS — Z7984 Long term (current) use of oral hypoglycemic drugs: Secondary | ICD-10-CM | POA: Diagnosis not present

## 2017-01-12 DIAGNOSIS — Z8673 Personal history of transient ischemic attack (TIA), and cerebral infarction without residual deficits: Secondary | ICD-10-CM | POA: Insufficient documentation

## 2017-01-12 DIAGNOSIS — Z7982 Long term (current) use of aspirin: Secondary | ICD-10-CM | POA: Diagnosis not present

## 2017-01-12 DIAGNOSIS — I2510 Atherosclerotic heart disease of native coronary artery without angina pectoris: Secondary | ICD-10-CM | POA: Diagnosis not present

## 2017-01-12 DIAGNOSIS — I129 Hypertensive chronic kidney disease with stage 1 through stage 4 chronic kidney disease, or unspecified chronic kidney disease: Secondary | ICD-10-CM | POA: Insufficient documentation

## 2017-01-12 DIAGNOSIS — Z7901 Long term (current) use of anticoagulants: Secondary | ICD-10-CM | POA: Diagnosis not present

## 2017-01-12 DIAGNOSIS — Z79899 Other long term (current) drug therapy: Secondary | ICD-10-CM | POA: Insufficient documentation

## 2017-01-12 DIAGNOSIS — N189 Chronic kidney disease, unspecified: Secondary | ICD-10-CM | POA: Insufficient documentation

## 2017-01-12 DIAGNOSIS — Z0189 Encounter for other specified special examinations: Secondary | ICD-10-CM

## 2017-01-12 HISTORY — DX: Unspecified atrial fibrillation: I48.91

## 2017-01-12 LAB — CBC
HCT: 41.3 % (ref 39.0–52.0)
Hemoglobin: 13.9 g/dL (ref 13.0–17.0)
MCH: 29.6 pg (ref 26.0–34.0)
MCHC: 33.7 g/dL (ref 30.0–36.0)
MCV: 87.9 fL (ref 78.0–100.0)
PLATELETS: 228 10*3/uL (ref 150–400)
RBC: 4.7 MIL/uL (ref 4.22–5.81)
RDW: 13.1 % (ref 11.5–15.5)
WBC: 11.1 10*3/uL — ABNORMAL HIGH (ref 4.0–10.5)

## 2017-01-12 LAB — BASIC METABOLIC PANEL
Anion gap: 9 (ref 5–15)
BUN: 18 mg/dL (ref 6–20)
CALCIUM: 8.7 mg/dL — AB (ref 8.9–10.3)
CO2: 26 mmol/L (ref 22–32)
Chloride: 102 mmol/L (ref 101–111)
Creatinine, Ser: 0.94 mg/dL (ref 0.61–1.24)
GFR calc Af Amer: >60 mL/min (ref >60)
Glucose, Bld: 115 mg/dL — ABNORMAL HIGH (ref 65–99)
Potassium: 3.8 mmol/L (ref 3.5–5.1)
SODIUM: 137 mmol/L (ref 135–145)

## 2017-01-12 MED ORDER — ETOMIDATE 2 MG/ML IV SOLN
17.0000 mg | Freq: Once | INTRAVENOUS | Status: AC
  Administered 2017-01-12: 17 mg via INTRAVENOUS
  Filled 2017-01-12: qty 10

## 2017-01-12 NOTE — ED Notes (Signed)
Pt given coke, tolerating well 

## 2017-01-12 NOTE — ED Notes (Signed)
Pt reporting left sided chest pain that is worse. EKG repeated and shown to Dr. Adela Lank.

## 2017-01-12 NOTE — Progress Notes (Signed)
RRT at bedside during cardioversion

## 2017-01-12 NOTE — ED Provider Notes (Signed)
[facility]-EMERGENCY DEPT Provider Note   CSN: [id number] Arrival date & time: 01/12/17  1824     History   Chief Complaint Chief Complaint  Patient presents with  . Atrial Fibrillation    HPI [name] is a 56 y.o. male.  The history is provided by the patient, the EMS personnel and medical records.  Atrial Fibrillation  This is a recurrent problem. The current episode started 12 to 24 hours ago. The problem occurs constantly. The problem has not changed since onset.Associated symptoms include chest pain. Pertinent negatives include no abdominal pain, no headaches and no shortness of breath. Nothing aggravates the symptoms. Nothing relieves the symptoms. He has tried rest for the symptoms. The treatment provided no relief.    56 y.o. male PMH COPD, CKD, DM, CAD recently diagnosed atrial fibrillation (12/19/16) on Eliquis presents for chest pressure. He was cardioverted during his recent admission, converted to NSR and was discharged on Eliquis. Pt states he awoke with chest pressure around 0400 this morning. He saw Dr Sharyn Lull in clinic today and was found to be in Afib. He was instructed to go home and take double his dose of amiodarone. Pt states he went home and still felt chest pressure despite resting so he called EMS.   Past Medical History:  Diagnosis Date  . A-fib (HCC)   . Aneurysm (HCC)    brain  . Arthritis   . Bipolar disorder (HCC)    was on meds but was taken off 2 yrs ago and none since  . Burning pain    in both legs-seeing Dr.Sethi for this  . Cataracts, bilateral   . Childhood asthma    Last asthma attack at age 26; History of trach at 16 months (12/17/2016)  . Chronic kidney disease 2009  . Chronic lower back pain   . COPD (chronic obstructive pulmonary disease) (HCC) 06/2012   uses Spiriva and Albuterol daily as needed  . Depression   . Family history of adverse reaction to anesthesia    "Mom and sister have PONV"  . GERD (gastroesophageal reflux disease)    was on Nexium;taking Omeprazole daily as needed  . H/O hiatal hernia   . Headache(784.0)    "q 2-3 weeks; daily last 2 wks" (12/17/2016)  . Hyperlipidemia    takes Ramipril daily  . Hypertension    takes Ramipril daily  . Joint pain   . Joint swelling   . Nocturia   . Numbness    both arms   . Pneumonia    hx of-2014  . PONV (postoperative nausea and vomiting)    Pt reports nausea only.  . Sleep apnea    hx (12/17/2016)  . Stroke [facility]) 2015   "drag left foot more since; have to wear glasses now" (12/17/2016)  . Type II diabetes mellitus (HCC)    takes Metformin daily  . Umbilical hernia   . Urinary frequency     Patient Active Problem List   Diagnosis Date Noted  . Atrial fibrillation with RVR (HCC) 12/17/2016  . Carotid stenosis, symptomatic w/o infarct 09/25/2015  . Paresthesia and pain of right extremity 12/04/2014  . Carotid stenosis 12/04/2014  . Stroke, hemorrhagic (HCC) 09/01/2014  . 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 TIA (transient ischemic attack) 08/14/2014  . Right arm weakness   . Cerebral hemorrhage (HCC) 08/11/2014  . ICH (intracerebral hemorrhage) (HCC) 08/11/2014  . Brain aneurysm 08/08/2014  . Chest pain at rest  07/08/2014  . right ICAO (internal carotid artery occlusion) 07/06/2014  . 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 Hyperlipidemia LDL goal <100 07/06/2014  . Morbid obesity (HCC) 07/06/2014  . Acute CVA (cerebrovascular accident) (HCC) 07/03/2014  . GERD (gastroesophageal reflux disease) 10/19/2012  . COPD (chronic obstructive pulmonary disease) (HCC) 06/12/2012    Past Surgical History:  Procedure Laterality Date  . ANEURYSM COILING  2015  . CARDIOVERSION N/A 12/18/2016   Procedure: CARDIOVERSION;  Surgeon: Orpah Cobb, MD;  Location: [facility] ENDOSCOPY;  Service: Cardiovascular;  Laterality: N/A;  . COLONOSCOPY    . EXCISIONAL HEMORRHOIDECTOMY  1980s   "soon after rectal OR"  . GANGLION  CYST EXCISION Right   . RADIOLOGY WITH ANESTHESIA N/A 08/08/2014   Procedure: RADIOLOGY WITH ANESTHESIA EMBOLIZATION;  Surgeon: Medication Radiologist, MD;  Location: [facility] OR;  Service: Radiology;  Laterality: N/A;  . RADIOLOGY WITH ANESTHESIA N/A 11/21/2014   Procedure: RADIOLOGY WITH ANESTHESIA;  Surgeon: Oneal Grout, MD;  Location: [facility] OR;  Service: Radiology;  Laterality: N/A;  . RADIOLOGY WITH ANESTHESIA N/A 02/27/2015   Procedure: RADIOLOGY WITH ANESTHESIA;  Surgeon: [name], MD;  Location: [facility] OR;  Service: Radiology;  Laterality: N/A;  . RADIOLOGY WITH ANESTHESIA N/A 09/25/2015   Procedure: RADIOLOGY WITH ANESTHESIA;  Surgeon: [name], MD;  Location: [facility] OR;  Service: Radiology;  Laterality: N/A;  . RECTAL SURGERY  1980s   "tore it lifting heavy furniture; sewed it back together"  . SHOULDER ARTHROSCOPY WITH OPEN ROTATOR CUFF REPAIR Right 2016  . SHOULDER ARTHROSCOPY WITH ROTATOR CUFF REPAIR Left 1996  . SHOULDER OPEN ROTATOR CUFF REPAIR Left 1997   "took out 8inches of my collarbone"  . TEE WITHOUT CARDIOVERSION N/A 12/18/2016   Procedure: TRANSESOPHAGEAL ECHOCARDIOGRAM (TEE);  Surgeon: Orpah Cobb, MD;  Location: [facility] ENDOSCOPY;  Service: Cardiovascular;  Laterality: N/A;  . TRACHEOSTOMY  1964   at age 9 months old; for asthma  . TRACHEOSTOMY CLOSURE         Home Medications    Prior to Admission medications   Medication Sig Start Date End Date Taking? Authorizing Provider  acetaminophen (TYLENOL) 500 MG tablet Take 1 tablet (500 mg total) by mouth every 6 (six) hours as needed. Patient taking differently: Take 500 mg by mouth every 6 (six) hours as needed for moderate pain or headache.  11/18/16   Everlene Farrier, PA-C  albuterol (PROVENTIL HFA;VENTOLIN HFA) 108 (90 BASE) MCG/ACT inhaler Inhale 2 puffs into the lungs every 6 (six) hours as needed for wheezing or shortness of breath.     Historical Provider, MD  amiodarone (PACERONE) 200 MG tablet Take 1  tablet (200 mg total) by mouth daily. 12/20/16   Orpah Cobb, MD  apixaban (ELIQUIS) 5 MG TABS tablet Take 1 tablet (5 mg total) by mouth 2 (two) times daily. 12/19/16   Orpah Cobb, MD  aspirin EC 81 MG tablet Take 81 mg by mouth every other day.    Historical Provider, MD  atorvastatin (LIPITOR) 20 MG tablet Take 1 tablet (20 mg total) by mouth daily at 6 PM. 07/06/14   Calvert Cantor, MD  clopidogrel (PLAVIX) 75 MG tablet Take 0.5 tablets (37.5 mg total) by mouth daily. Patient taking differently: Take 37.5 mg by mouth every other day.  08/14/14   Jeralyn Bennett, MD  cyclobenzaprine (FLEXERIL) 5 MG tablet Take 5 mg by mouth at bedtime.    Historical Provider, MD  esomeprazole (NEXIUM) 40 MG capsule Take 40 mg by mouth  at bedtime as needed (for acid reflux).     Historical Provider, MD  gabapentin (NEURONTIN) 100 MG capsule TAKE 1 CAPSULE BY MOUTH THREE TIMES DAILY 08/25/15   Marvel Plan, MD  hydrochlorothiazide (HYDRODIURIL) 25 MG tablet Take 0.5 tablets (12.5 mg total) by mouth daily. 12/19/16   Orpah Cobb, MD  metFORMIN (GLUCOPHAGE) 500 MG tablet Take 1 tablet (500 mg total) by mouth 2 (two) times daily with a meal. Patient taking differently: Take 500 mg by mouth daily with breakfast.  07/06/14   Calvert Cantor, MD  nitroGLYCERIN (NITROSTAT) 0.4 MG SL tablet Place 1 tablet (0.4 mg total) under the tongue every 5 (five) minutes x 3 doses as needed for chest pain. 07/08/14   Orpah Cobb, MD  ramipril (ALTACE) 5 MG capsule Take 5 mg by mouth daily. 09/03/15   Historical Provider, MD  umeclidinium-vilanterol (ANORO ELLIPTA) 62.5-25 MCG/INH AEPB Inhale 1 puff into the lungs daily.    Historical Provider, MD    Family History Family History  Problem Relation Age of Onset  . Heart disease Mother     s/p 3V CABG  . Cancer Father 73    lung cancer; +tobacco  . Stroke Father   . Cancer Maternal Grandmother   . Heart disease Maternal Grandmother   . Cancer Paternal Grandmother   . Lupus Sister   .  Multiple sclerosis Sister   . Anemia Daughter     Social History Social History  Substance Use Topics  . Smoking status: Former Smoker    Packs/day: 1.50    Years: 25.00    Types: Cigarettes    Start date: 01/15/1975    Quit date: 06/05/2014  . Smokeless tobacco: Never Used  . Alcohol use 3.0 oz/week    5 Cans of beer per week     Comment: 12/17/2016 "4-5 beers q Wednesday"     Allergies   Tylox [oxycodone-acetaminophen] and Adhesive [tape]   Review of Systems Review of Systems  Constitutional: Negative for fatigue and fever.  Respiratory: Negative for cough, chest tightness and shortness of breath.   Cardiovascular: Positive for chest pain.  Gastrointestinal: Negative for abdominal pain, diarrhea, nausea and vomiting.  Musculoskeletal: Negative for back pain and myalgias.  Allergic/Immunologic: Negative for immunocompromised state.  Neurological: Negative for light-headedness and headaches.     Physical Exam Updated Vital Signs BP 104/68   Pulse 61   Temp 97.9 F (36.6 C) (Oral)   Resp 12   Ht  (1.727 m)   Wt 114.3 kg   SpO2 99%   BMI 38.32 kg/m   Physical Exam  Constitutional: He is oriented to person, place, and time. He appears well-developed and well-nourished. No distress.  HENT:  Head: Normocephalic and atraumatic.  Eyes: Conjunctivae and EOM are normal.  Neck: Neck supple.  Cardiovascular: An irregularly irregular rhythm present. Tachycardia present.   Pulses:      Radial pulses are 2+ on the right side, and 2+ on the left side.  Pulmonary/Chest: Effort normal and breath sounds normal. No respiratory distress.  Abdominal: Soft. There is no tenderness.  Musculoskeletal: Normal range of motion. He exhibits no edema.  Neurological: He is alert and oriented to person, place, and time.  Skin: Skin is warm and dry. Capillary refill takes less than 2 seconds. He is not diaphoretic.  Psychiatric: He has a normal mood and affect.  Nursing note and vitals  reviewed.    ED Treatments / Results  Labs (all labs ordered are listed,  but only abnormal results are displayed) Labs Reviewed  BASIC METABOLIC PANEL - Abnormal; Notable for the following:       Result Value   Glucose, Bld 115 (*)    Calcium 8.7 (*)    All other components within normal limits  CBC - Abnormal; Notable for the following:    WBC 11.1 (*)    All other components within normal limits    EKG  EKG Interpretation  Date/Time:  Tuesday January 12 2017 18:32:02 EDT Ventricular Rate:  110 PR Interval:    QRS Duration: 109 QT Interval:  373 QTC Calculation: 505 R Axis:   -13 Text Interpretation:  Atrial fibrillation Abnormal R-wave progression, early transition Prolonged QT interval Otherwise no significant change Confirmed by Adela Lank MD, DANIEL [phone]) on 01/12/2017 6:42:25 PM       Radiology Dg Chest 2 View  Result Date: 01/12/2017 CLINICAL DATA:  Chest pain and pressure EXAM: CHEST  2 VIEW COMPARISON:  10/28/2015 FINDINGS: The heart size and mediastinal contours are within normal limits. Both lungs are clear. The visualized skeletal structures are unremarkable. IMPRESSION: No active cardiopulmonary disease. Electronically Signed   By: Jasmine Pang M.D.   On: 01/12/2017 19:35    Procedures .Cardioversion Date/Time: 01/12/2017 10:08 PM Performed by: Pablo Ledger Authorized by: Melene Plan   Consent:    Consent obtained:  Written   Consent given by:  Patient   Risks discussed:  Induced arrhythmia, pain and death   Alternatives discussed:  Rate-control medication Pre-procedure details:    Cardioversion basis:  Elective   Rhythm:  Atrial fibrillation Attempt one:    Cardioversion mode:  Synchronous   Shock (Joules):  200   Shock outcome:  Conversion to normal sinus rhythm Post-procedure details:    Patient status:  Alert   Patient tolerance of procedure:  Tolerated well, no immediate complications .Sedation Date/Time: 01/12/2017 10:09 PM Performed  by: Pablo Ledger Authorized by: Melene Plan   Consent:    Consent obtained:  Written   Consent given by:  Patient   Risks discussed:  Inadequate sedation, nausea, vomiting and respiratory compromise necessitating ventilatory assistance and intubation   Alternatives discussed:  Analgesia without sedation Indications:    Procedure performed:  Cardioversion   Procedure necessitating sedation performed by:  Physician performing sedation   Intended level of sedation:  Moderate (conscious sedation) Pre-sedation assessment:    Neck mobility: normal     Mouth opening:  3 or more finger widths   Mallampati score:  I - soft palate, uvula, fauces, pillars visible   Pre-sedation assessments completed and reviewed: airway patency, cardiovascular function, mental status, nausea/vomiting and respiratory function   Immediate pre-procedure details:    Reassessment: Patient reassessed immediately prior to procedure     Reviewed: vital signs and relevant labs/tests     Verified: bag valve mask available, intubation equipment available, IV patency confirmed, oxygen available and suction available   Procedure details (see MAR for exact dosages):    Preoxygenation:  Room air   Sedation:  Etomidate   Intra-procedure monitoring:  Blood pressure monitoring, cardiac monitor, continuous pulse oximetry and continuous capnometry   Intra-procedure events: none   Post-procedure details:    Attendance: Constant attendance by certified staff until patient recovered     Recovery: Patient returned to pre-procedure baseline     Patient is stable for discharge or admission: yes     Patient tolerance:  Tolerated well, no immediate complications   (including critical care time)  Medications Ordered in ED Medications  etomidate (AMIDATE) injection 17 mg (17 mg Intravenous Given 01/12/17 2201)     Initial Impression / Assessment and Plan / ED Course  I have reviewed the triage vital signs and the nursing  notes.  Pertinent labs & imaging results that were available during my care of the patient were reviewed by me and considered in my medical decision making (see chart for details).    56 y.o. male presents for Atrial fibrillation with RVR. HR 90-130s. Normotensive. Pt well hydrated and well appearing. Has not missed any doses of Eliquis. Discussed with Dr Sharyn Lull who advise cardioversion and follow-up in clinic tomorrow morning. Cardioversion completed as documented above, converted to NSR. Pt tolerated well w/o complications.  Final Clinical Impressions(s) / ED Diagnoses   Final diagnoses:  Atrial fibrillation with RVR (HCC)  Encounter for cardioversion procedure    New Prescriptions Discharge Medication List as of 01/12/2017 11:14 PM       Pablo Ledger, MD 01/13/17 0104    Melene Plan, DO 01/13/17 1521

## 2017-01-12 NOTE — ED Notes (Signed)
Pt ambulating independently around room and hallway with steady gait, NAD and states he feels fine.

## 2017-01-12 NOTE — ED Triage Notes (Signed)
Per [facility]: About 4 am, woke up with chest pian/pressure - across middle of chest, now feels like tingling across the chest, does not radiate, did have shortness of breath this morning. Does have hx of A.fib,did have electrical cardioversion about 3-4 weeks ago.  12 lead showed afib, RVR, rate 90-130's.

## 2017-01-12 NOTE — ED Notes (Signed)
Pt has signed consent form for cardioversion

## 2017-01-12 NOTE — Sedation Documentation (Signed)
Synchronized cardioversion at 200j

## 2017-01-22 ENCOUNTER — Encounter: Payer: Self-pay | Admitting: Rehabilitation

## 2017-01-22 ENCOUNTER — Ambulatory Visit: Payer: Medicare Other | Attending: Family Medicine | Admitting: Rehabilitation

## 2017-01-22 DIAGNOSIS — M545 Low back pain, unspecified: Secondary | ICD-10-CM
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
## 2017-01-22 NOTE — Patient Instructions (Signed)
HEP given via patients cell phone Hamstring stretch, Single knee to chest, Low trunk rotation, and bridge

## 2017-01-28 DIAGNOSIS — L739 Follicular disorder, unspecified: Secondary | ICD-10-CM | POA: Insufficient documentation

## 2017-01-29 ENCOUNTER — Ambulatory Visit: Payer: Medicare Other | Admitting: Physical Therapy

## 2017-02-01 ENCOUNTER — Encounter: Payer: Self-pay | Admitting: Physical Therapy

## 2017-02-01 ENCOUNTER — Ambulatory Visit: Payer: Medicare Other | Admitting: Physical Therapy

## 2017-02-01 DIAGNOSIS — M545 Low back pain, unspecified: Secondary | ICD-10-CM

## 2017-02-01 NOTE — Therapy (Signed)
[facility] [address] [place], [place], [address] Phone: [phone]   Fax:  [phone]  Physical Therapy Treatment  Patient Details  Name: [name] MRN: [id number] Date of Birth: 1960/11/26 Referring Provider: Everlene Other  Encounter Date: 02/01/2017      PT End of Session - 02/01/17 0920    Visit Number 2   Date for PT Re-Evaluation 03/19/17   PT Start Time 0840   PT Stop Time 0935   PT Time Calculation (min) 55 min   Activity Tolerance Patient tolerated treatment well      Past Medical History:  Diagnosis Date  . A-fib (HCC)   . Aneurysm (HCC)    brain  . Arthritis   . Bipolar disorder (HCC)    was on meds but was taken off 2 yrs ago and none since  . Burning pain    in both legs-seeing Dr.Sethi for this  . Cataracts, bilateral   . Childhood asthma    Last asthma attack at age 71; History of trach at 16 months (12/17/2016)  . Chronic kidney disease 2009  . Chronic lower back pain   . COPD (chronic obstructive pulmonary disease) (HCC) 06/2012   uses Spiriva and Albuterol daily as needed  . Depression   . Family history of adverse reaction to anesthesia    "Mom and sister have PONV"  . GERD (gastroesophageal reflux disease)    was on Nexium;taking Omeprazole daily as needed  . H/O hiatal hernia   . Headache(784.0)    "q 2-3 weeks; daily last 2 wks" (12/17/2016)  . Hyperlipidemia    takes Ramipril daily  . Hypertension    takes Ramipril daily  . Joint pain   . Joint swelling   . Nocturia   . Numbness    both arms   . Pneumonia    hx of-2014  . PONV (postoperative nausea and vomiting)    Pt reports nausea only.  . Sleep apnea    hx (12/17/2016)  . Stroke [facility]) 2015   "drag left foot more since; have to wear glasses now" (12/17/2016)  . Type II diabetes mellitus (HCC)    takes Metformin daily  . Umbilical hernia   . Urinary frequency     Past Surgical History:  Procedure Laterality Date  .  ANEURYSM COILING  2015  . CARDIOVERSION N/A 12/18/2016   Procedure: CARDIOVERSION;  Surgeon: Orpah Cobb, MD;  Location: [facility] ENDOSCOPY;  Service: Cardiovascular;  Laterality: N/A;  . COLONOSCOPY    . EXCISIONAL HEMORRHOIDECTOMY  1980s   "soon after rectal OR"  . GANGLION CYST EXCISION Right   . RADIOLOGY WITH ANESTHESIA N/A 08/08/2014   Procedure: RADIOLOGY WITH ANESTHESIA EMBOLIZATION;  Surgeon: Medication Radiologist, MD;  Location: [facility] OR;  Service: Radiology;  Laterality: N/A;  . RADIOLOGY WITH ANESTHESIA N/A 11/21/2014   Procedure: RADIOLOGY WITH ANESTHESIA;  Surgeon: Oneal Grout, MD;  Location: [facility] OR;  Service: Radiology;  Laterality: N/A;  . RADIOLOGY WITH ANESTHESIA N/A 02/27/2015   Procedure: RADIOLOGY WITH ANESTHESIA;  Surgeon: [name], MD;  Location: [facility] OR;  Service: Radiology;  Laterality: N/A;  . RADIOLOGY WITH ANESTHESIA N/A 09/25/2015   Procedure: RADIOLOGY WITH ANESTHESIA;  Surgeon: [name], MD;  Location: [facility] OR;  Service: Radiology;  Laterality: N/A;  . RECTAL SURGERY  1980s   "tore it lifting heavy furniture; sewed it back together"  . SHOULDER ARTHROSCOPY WITH OPEN ROTATOR CUFF REPAIR Right 2016  . SHOULDER  ARTHROSCOPY WITH ROTATOR CUFF REPAIR Left 1996  . SHOULDER OPEN ROTATOR CUFF REPAIR Left 1997   "took out 8inches of my collarbone"  . TEE WITHOUT CARDIOVERSION N/A 12/18/2016   Procedure: TRANSESOPHAGEAL ECHOCARDIOGRAM (TEE);  Surgeon: Orpah Cobb, MD;  Location: [facility] ENDOSCOPY;  Service: Cardiovascular;  Laterality: N/A;  . TRACHEOSTOMY  1964   at age 64 months old; for asthma  . TRACHEOSTOMY CLOSURE      There were no vitals filed for this visit.      Subjective Assessment - 02/01/17 0836    Subjective Pt reports that he is doing ok. Only has pain when he is walking. Rates it as a 5 when walking and described it as a stabbing pain.    Currently in Pain? Yes   Pain Score 5    Pain Location Back   Pain Orientation Right;Left   Pain  Descriptors / Indicators Stabbing   Pain Onset More than a month ago                         [facility] Adult PT Treatment/Exercise - 02/01/17 0001      Lumbar Exercises: Stretches   Passive Hamstring Stretch 3 reps;30 seconds   Single Knee to Chest Stretch 2 reps;30 seconds   Lower Trunk Rotation 3 reps;20 seconds     Lumbar Exercises: Aerobic   Elliptical Nustep lvl 3 6 min     Lumbar Exercises: Machines for Strengthening   Cybex Knee Extension 2x10 15lbs    Cybex Knee Flexion 2 x10 25lbs     Lumbar Exercises: Standing   Heel Raises --  calf stretch 3x20 secs    Row 10 reps   Theraband Level (Row) Level 3 (Green)   Row Limitations 2 sets    Shoulder Extension 10 reps   Theraband Level (Shoulder Extension) Level 3 (Green)   Shoulder Extension Limitations 2 sets   Other Standing Lumbar Exercises overhead wall touch back extensions  2 x10     Lumbar Exercises: Supine   Bridge 10 reps   3 sets, 2 sets with ball squeeze     Modalities   Modalities Electrical Stimulation;Moist Heat     Moist Heat Therapy   Number Minutes Moist Heat 15 Minutes   Moist Heat Location Lumbar Spine     Electrical Stimulation   Electrical Stimulation Location Lumbar    Electrical Stimulation Action IFC   Electrical Stimulation Goals Pain                     PT Long Term Goals - 01/22/17 0905      PT LONG TERM GOAL #1   Title Pt will return to painfree lumbar status with moving the trash can   Time 8   Period Weeks   Status New     PT LONG TERM GOAL #2   Title pt will be independent with HEP    Time 8   Period Weeks     PT LONG TERM GOAL #3   Title pt will demonstrate proper lifting mechanics for home safety   Time 8   Period Weeks               Plan - 02/01/17 9147    Clinical Impression Statement Pt. completed all exercises. Complained of increased pain during bridges. No reports of pain during seated knee flexion/extension exercises just some  tightness by the end of each exercises. Pt reported pain rated  at a 5 during standing exercises. Reviewed HEP of stretches pt stated that he does these at home but sometimes they cause him pain if he does too much.  No pain reported when sitting or laying at rest.    Rehab Potential Good   PT Frequency 2x / week   PT Duration 6 weeks   PT Treatment/Interventions Electrical Stimulation;Traction;Moist Heat;Therapeutic activities;Therapeutic exercise;Neuromuscular re-education;Patient/family education;Manual techniques;Passive range of motion;Taping   PT Next Visit Plan continue lumbar and LE flexibility and strengthening to increase activity tolerance       Patient will benefit from skilled therapeutic intervention in order to improve the following deficits and impairments:  Decreased activity tolerance, Decreased range of motion, Decreased balance, Abnormal gait, Pain, Difficulty walking  Visit Diagnosis: Acute midline low back pain without sciatica     Problem List Patient Active Problem List   Diagnosis Date Noted  . Atrial fibrillation with RVR (HCC) 12/17/2016  . Carotid stenosis, symptomatic w/o infarct 09/25/2015  . Paresthesia and pain of right extremity 12/04/2014  . Carotid stenosis 12/04/2014  . Stroke, hemorrhagic (HCC) 09/01/2014  . 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 TIA (transient ischemic attack) 08/14/2014  . Right arm weakness   . Cerebral hemorrhage (HCC) 08/11/2014  . ICH (intracerebral hemorrhage) (HCC) 08/11/2014  . Brain aneurysm 08/08/2014  . Chest pain at rest 07/08/2014  . right ICAO (internal carotid artery occlusion) 07/06/2014  . 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 Hyperlipidemia LDL goal <100 07/06/2014  . Morbid obesity (HCC) 07/06/2014  . Acute CVA (cerebrovascular accident) (HCC) 07/03/2014  . GERD (gastroesophageal reflux disease) 10/19/2012  . COPD (chronic obstructive pulmonary  disease) (HCC) 06/12/2012    Dorothyann Gibbs 02/01/2017, 9:28 AM  [facility] [address] [place], [place], [address] Phone: [phone]   Fax:  [phone]  Name: [name] MRN: [id number] Date of Birth: 04/02/1961

## 2017-02-03 ENCOUNTER — Encounter: Payer: Self-pay | Admitting: Physical Therapy

## 2017-02-03 ENCOUNTER — Ambulatory Visit: Payer: Medicare Other | Admitting: Physical Therapy

## 2017-02-03 DIAGNOSIS — M545 Low back pain, unspecified: Secondary | ICD-10-CM

## 2017-02-03 NOTE — Therapy (Addendum)
[address] [place], [place], [address] Phone: [phone]   Fax:  [phone]  Physical Therapy Treatment  Patient Details  Name: [name] MRN: [id number] Date of Birth: May 16, 1961 Referring Provider: Coletta Memos  Encounter Date: 02/03/2017      PT End of Session - 02/03/17 0916    Visit Number 3   Date for PT Re-Evaluation 03/19/17   PT Start Time 0835   PT Stop Time 0930   PT Time Calculation (min) 55 min   Activity Tolerance Patient tolerated treatment well      Past Medical History:  Diagnosis Date  . A-fib ([place])   . 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 Arthritis   . Bipolar disorder ([place])    was on meds but was taken off 2 yrs ago and none since  . Burning pain    in both legs-seeing Dr.Sethi for this  . Cataracts, bilateral   . Childhood asthma    Last asthma attack at age 56; History of trach at 16 months (12/17/2016)  . Chronic kidney disease 2009  . Chronic lower back pain   . COPD (chronic obstructive pulmonary disease) ([place]) 06/2012   uses Spiriva and Albuterol daily as needed  . Depression   . Family history of adverse reaction to anesthesia    "Mom and sister have PONV"  . GERD (gastroesophageal reflux disease)    was on Nexium;taking Omeprazole daily as needed  . H/O hiatal hernia   . Headache(784.0)    "q 2-3 weeks; daily last 2 wks" (12/17/2016)  . Hyperlipidemia    takes Ramipril daily  . Hypertension    takes Ramipril daily  . Joint pain   . Joint swelling   . Nocturia   . Numbness    both arms   . Pneumonia    hx of-2014  . PONV (postoperative nausea and vomiting)    Pt reports nausea only.  . Sleep apnea    hx (12/17/2016)  . Stroke [facility]) 2015   "drag left foot more since; have to wear glasses now" (12/17/2016)  . Type II diabetes mellitus (HCC)    takes Metformin daily  . Umbilical hernia   . Urinary frequency     Past Surgical History:  Procedure Laterality Date  .  ANEURYSM COILING  2015  . CARDIOVERSION N/A 12/18/2016   Procedure: CARDIOVERSION;  Surgeon: Dixie Dials, MD;  Location: [facility] ENDOSCOPY;  Service: Cardiovascular;  Laterality: N/A;  . COLONOSCOPY    . EXCISIONAL HEMORRHOIDECTOMY  1980s   "soon after rectal OR"  . GANGLION CYST EXCISION Right   . RADIOLOGY WITH ANESTHESIA N/A 08/08/2014   Procedure: RADIOLOGY WITH ANESTHESIA EMBOLIZATION;  Surgeon: Medication Radiologist, MD;  Location: [place];  Service: Radiology;  Laterality: N/A;  . RADIOLOGY WITH ANESTHESIA N/A 11/21/2014   Procedure: RADIOLOGY WITH ANESTHESIA;  Surgeon: Rob Hickman, MD;  Location: [facility];  Service: Radiology;  Laterality: N/A;  . RADIOLOGY WITH ANESTHESIA N/A 02/27/2015   Procedure: RADIOLOGY WITH ANESTHESIA;  Surgeon: Luanne Bras, MD;  Location: [place];  Service: Radiology;  Laterality: N/A;  . RADIOLOGY WITH ANESTHESIA N/A 09/25/2015   Procedure: RADIOLOGY WITH ANESTHESIA;  Surgeon: Luanne Bras, MD;  Location: [facility];  Service: Radiology;  Laterality: N/A;  . RECTAL SURGERY  1980s   "tore it lifting heavy furniture; sewed it back together"  . SHOULDER ARTHROSCOPY WITH OPEN ROTATOR CUFF REPAIR Right 2016  . SHOULDER  ARTHROSCOPY WITH ROTATOR CUFF REPAIR Left 1996  . SHOULDER OPEN ROTATOR CUFF REPAIR Left 1997   "took out 8inches of my collarbone"  . TEE WITHOUT CARDIOVERSION N/A 12/18/2016   Procedure: TRANSESOPHAGEAL ECHOCARDIOGRAM (TEE);  Surgeon: Dixie Dials, MD;  Location: [facility] ENDOSCOPY;  Service: Cardiovascular;  Laterality: N/A;  . TRACHEOSTOMY  1964   at age 29 months old; for asthma  . TRACHEOSTOMY CLOSURE      There were no vitals filed for this visit.      Subjective Assessment - 02/03/17 0830    Subjective Pt reports that he isn't in any pain today. Didn't sleep well last night due to pain in his low back. Stated that he had a warm sensation run down his right leg after walking the dog yesterday.    Currently in Pain? No/denies   Pain Score  0-No pain   Pain Onset More than a month ago                         [facility] Adult PT Treatment/Exercise - 02/03/17 0001      Lumbar Exercises: Stretches   Passive Hamstring Stretch 3 reps;30 seconds   Lower Trunk Rotation 3 reps;20 seconds     Lumbar Exercises: Aerobic   Elliptical Nustep lvl 3 6 min     Lumbar Exercises: Machines for Strengthening   Leg Press 2x10 50lbs    Other Lumbar Machine Exercise Rows 2x10   1 set 20lb/ 2nd set 25lbs   Other Lumbar Machine Exercise Lat pull downs 2x10    35lbs. 2nd set stopped at 5 reps     Lumbar Exercises: Standing   Other Standing Lumbar Exercises seated back extension 2x10   black theraband   Other Standing Lumbar Exercises calf stretch 3 reps/ 20 secs     Lumbar Exercises: Seated   Sit to Stand Limitations 2x15  3lbs     Lumbar Exercises: Supine   Bridge 10 reps  2 sets, with ball squeeze     Modalities   Modalities Electrical Stimulation;Moist Heat     Moist Heat Therapy   Number Minutes Moist Heat 15 Minutes     Electrical Stimulation   Electrical Stimulation Location Lumbar   Electrical Stimulation Action IFC   Electrical Stimulation Goals Pain                     PT Long Term Goals - 02/03/17 0924      PT LONG TERM GOAL #1   Status On-going     PT LONG TERM GOAL #2   Status On-going     PT LONG TERM GOAL #3   Status On-going               Plan - 02/03/17 0917    Clinical Impression Statement Pt. Couldn't complete 2nd set of lat pull downs. Reported a popping sensation in right shoulder After 5 reps.Completed all other exercises well. Complained of increased tightness and pain after all exercises today. Pt kept requesting higher weights during lat pull downs/rows and leg pressed. Didn't increase weight explained to pt that we were focusing on correct technique and posture for execises and no pain increase during exercises. Pt reported tingle sensation in low back during  squats on left side.    Rehab Potential Good   PT Frequency 2x / week   PT Duration 6 weeks   PT Treatment/Interventions Electrical Stimulation;Traction;Moist Heat;Therapeutic activities;Therapeutic exercise;Neuromuscular re-education;Patient/family education;Manual techniques;Passive  range of motion;Taping   PT Next Visit Plan continue lumbar and LE flexibility and strengthening to increase activity tolerance    Consulted and Agree with Plan of Care Patient      Patient will benefit from skilled therapeutic intervention in order to improve the following deficits and impairments:  Decreased activity tolerance, Decreased range of motion, Decreased balance, Abnormal gait, Pain, Difficulty walking  Visit Diagnosis: Acute midline low back pain without sciatica     Problem List Patient Active Problem List   Diagnosis Date Noted  . Atrial fibrillation with RVR ([place]) 12/17/2016  . Carotid stenosis, symptomatic w/o infarct 09/25/2015  . Paresthesia and pain of right extremity 12/04/2014  . Carotid stenosis 12/04/2014  . Stroke, hemorrhagic ([place]) 09/01/2014  . 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 TIA (transient ischemic attack) 08/14/2014  . Right arm weakness   . Cerebral hemorrhage ([place]) 08/11/2014  . ICH (intracerebral hemorrhage) ([place]) 08/11/2014  . Brain aneurysm 08/08/2014  . Chest pain at rest 07/08/2014  . right ICAO (internal carotid artery occlusion) 07/06/2014  . DM type 2 causing vascular disease ([place]) 07/06/2014  . Hyperlipidemia LDL goal <100 07/06/2014  . Morbid obesity ([place]) 07/06/2014  . Acute CVA (cerebrovascular accident) ([place]) 07/03/2014  . GERD (gastroesophageal reflux disease) 10/19/2012  . COPD (chronic obstructive pulmonary disease) ([place]) 06/12/2012    PHYSICAL THERAPY DISCHARGE SUMMARY  Visits from Start of Care: 3  Plan: Patient agrees to discharge.  Patient goals were not met. Patient is being discharged due  to not returning since the last visit.  ?????      Fransisca Connors 02/03/2017, 9:24 AM  [address] [place], [place], [address] Phone: [phone]   Fax:  [phone]  Name: SYLIS KETCHUM MRN: [id number] Date of Birth: July 28, 1961

## 2017-03-26 ENCOUNTER — Other Ambulatory Visit (HOSPITAL_COMMUNITY): Payer: Self-pay | Admitting: Interventional Radiology

## 2017-03-26 DIAGNOSIS — I639 Cerebral infarction, unspecified: Secondary | ICD-10-CM

## 2017-03-26 DIAGNOSIS — I729 Aneurysm of unspecified site: Secondary | ICD-10-CM

## 2017-03-26 DIAGNOSIS — I771 Stricture of artery: Secondary | ICD-10-CM

## 2017-03-29 ENCOUNTER — Ambulatory Visit (HOSPITAL_COMMUNITY): Payer: Medicare Other

## 2017-03-29 ENCOUNTER — Ambulatory Visit (HOSPITAL_COMMUNITY)
Admission: RE | Admit: 2017-03-29 | Discharge: 2017-03-29 | Disposition: A | Discharge status: Home / Self Care | Payer: Medicare Other | Source: Ambulatory Visit | Attending: Interventional Radiology | Admitting: Interventional Radiology

## 2017-03-29 ENCOUNTER — Encounter (HOSPITAL_COMMUNITY): Payer: Self-pay

## 2017-03-29 DIAGNOSIS — I639 Cerebral infarction, unspecified: Secondary | ICD-10-CM | POA: Diagnosis not present

## 2017-03-29 DIAGNOSIS — I771 Stricture of artery: Secondary | ICD-10-CM | POA: Insufficient documentation

## 2017-03-29 DIAGNOSIS — I720 Aneurysm of carotid artery: Secondary | ICD-10-CM | POA: Diagnosis not present

## 2017-03-29 DIAGNOSIS — I729 Aneurysm of unspecified site: Secondary | ICD-10-CM

## 2017-03-29 LAB — POCT I-STAT CREATININE: Creatinine, Ser: 0.9 mg/dL (ref 0.61–1.24)

## 2017-03-29 MED ORDER — IOPAMIDOL (ISOVUE-370) INJECTION 76%
INTRAVENOUS | Status: AC
  Administered 2017-03-29: 50 mL
  Filled 2017-03-29: qty 50

## 2017-03-30 ENCOUNTER — Emergency Department (HOSPITAL_COMMUNITY)
Admission: EM | Admit: 2017-03-30 | Discharge: 2017-03-30 | Disposition: A | Discharge status: Home / Self Care | Payer: Medicare Other | Attending: Emergency Medicine | Admitting: Emergency Medicine

## 2017-03-30 ENCOUNTER — Encounter (HOSPITAL_COMMUNITY): Payer: Self-pay | Admitting: Emergency Medicine

## 2017-03-30 DIAGNOSIS — Z79899 Other long term (current) drug therapy: Secondary | ICD-10-CM | POA: Diagnosis not present

## 2017-03-30 DIAGNOSIS — Z7901 Long term (current) use of anticoagulants: Secondary | ICD-10-CM | POA: Diagnosis not present

## 2017-03-30 DIAGNOSIS — R21 Rash and other nonspecific skin eruption: Secondary | ICD-10-CM | POA: Diagnosis not present

## 2017-03-30 DIAGNOSIS — Z7982 Long term (current) use of aspirin: Secondary | ICD-10-CM | POA: Insufficient documentation

## 2017-03-30 DIAGNOSIS — N189 Chronic kidney disease, unspecified: Secondary | ICD-10-CM | POA: Insufficient documentation

## 2017-03-30 DIAGNOSIS — Z7902 Long term (current) use of antithrombotics/antiplatelets: Secondary | ICD-10-CM | POA: Diagnosis not present

## 2017-03-30 DIAGNOSIS — E1159 Type 2 diabetes mellitus with other circulatory complications: Secondary | ICD-10-CM | POA: Insufficient documentation

## 2017-03-30 DIAGNOSIS — I129 Hypertensive chronic kidney disease with stage 1 through stage 4 chronic kidney disease, or unspecified chronic kidney disease: Secondary | ICD-10-CM | POA: Diagnosis not present

## 2017-03-30 DIAGNOSIS — J449 Chronic obstructive pulmonary disease, unspecified: Secondary | ICD-10-CM | POA: Insufficient documentation

## 2017-03-30 DIAGNOSIS — E1122 Type 2 diabetes mellitus with diabetic chronic kidney disease: Secondary | ICD-10-CM | POA: Insufficient documentation

## 2017-03-30 DIAGNOSIS — Z87891 Personal history of nicotine dependence: Secondary | ICD-10-CM | POA: Insufficient documentation

## 2017-03-30 DIAGNOSIS — Z7984 Long term (current) use of oral hypoglycemic drugs: Secondary | ICD-10-CM | POA: Diagnosis not present

## 2017-03-30 LAB — COMPREHENSIVE METABOLIC PANEL
ALT: 21 U/L (ref 17–63)
ANION GAP: 10 (ref 5–15)
AST: 22 U/L (ref 15–41)
Albumin: 4.4 g/dL (ref 3.5–5.0)
Alkaline Phosphatase: 63 U/L (ref 38–126)
BILIRUBIN TOTAL: 0.4 mg/dL (ref 0.3–1.2)
BUN: 14 mg/dL (ref 6–20)
CALCIUM: 10 mg/dL (ref 8.9–10.3)
CO2: 28 mmol/L (ref 22–32)
Chloride: 100 mmol/L — ABNORMAL LOW (ref 101–111)
Creatinine, Ser: 1.18 mg/dL (ref 0.61–1.24)
GFR calc Af Amer: >60 mL/min (ref >60)
Glucose, Bld: 100 mg/dL — ABNORMAL HIGH (ref 65–99)
POTASSIUM: 3.7 mmol/L (ref 3.5–5.1)
Sodium: 138 mmol/L (ref 135–145)
TOTAL PROTEIN: 7.1 g/dL (ref 6.5–8.1)

## 2017-03-30 LAB — CBC WITH DIFFERENTIAL/PLATELET
Basophils Absolute: 0 10*3/uL (ref 0.0–0.1)
Basophils Relative: 0 %
Eosinophils Absolute: 0.1 10*3/uL (ref 0.0–0.7)
Eosinophils Relative: 2 %
HEMATOCRIT: 42.2 % (ref 39.0–52.0)
Hemoglobin: 14.1 g/dL (ref 13.0–17.0)
LYMPHS ABS: 2.3 10*3/uL (ref 0.7–4.0)
LYMPHS PCT: 31 %
MCH: 29.6 pg (ref 26.0–34.0)
MCHC: 33.4 g/dL (ref 30.0–36.0)
MCV: 88.7 fL (ref 78.0–100.0)
MONO ABS: 0.9 10*3/uL (ref 0.1–1.0)
MONOS PCT: 12 %
NEUTROS ABS: 4.1 10*3/uL (ref 1.7–7.7)
Neutrophils Relative %: 55 %
Platelets: 244 10*3/uL (ref 150–400)
RBC: 4.76 MIL/uL (ref 4.22–5.81)
RDW: 12.5 % (ref 11.5–15.5)
WBC: 7.4 10*3/uL (ref 4.0–10.5)

## 2017-03-30 LAB — CBG MONITORING, ED: GLUCOSE-CAPILLARY: 102 mg/dL — AB (ref 65–99)

## 2017-03-30 NOTE — ED Triage Notes (Signed)
Patient with "rash" at his bilateral ankles.  Patient states that he is on Eliquis, had a CT scan on his brain yesterday, rash was not apparent yesterday.  He states that it is a burning sensation at his ankles.  It is the only area at this time.  It does itch per patient but hurts when it is palpated. No injury to the area.

## 2017-03-30 NOTE — ED Provider Notes (Addendum)
[facility]-EMER[facility]ENCY DEPT Provider Note   CSN: [id number] Arrival date & time: 03/30/17  1900     History   Chief Complaint Chief Complaint  Patient presents with  . Rash    HPI [name] is a 56 y.o. male. Chief complaint is leg rash  HPI:  56 year old male with history of atrial fibrillation. He is anticoagulated with Eliquis. He presents with a lower extremity rash. He was mowing his yard yesterday. He was wearing shoes and socks and came just to above his ankle. He has a rash that ends with his sock line. He otherwise feels well. No fever. No other areas of skin abnormality other than from his knees down. They have progressed slightly over 24 hours.  Past Medical History:  Diagnosis Date  . A-fib (HCC)   . Aneurysm (HCC)    brain  . Arthritis   . Bipolar disorder (HCC)    was on meds but was taken off 2 yrs ago and none since  . Burning pain    in both legs-seeing Dr.Sethi for this  . Cataracts, bilateral   . Childhood asthma    Last asthma attack at age 76; History of trach at 16 months (12/17/2016)  . Chronic kidney disease 2009  . Chronic lower back pain   . COPD (chronic obstructive pulmonary disease) (HCC) 06/2012   uses Spiriva and Albuterol daily as needed  . Depression   . Family history of adverse reaction to anesthesia    "Mom and sister have PONV"  . GERD (gastroesophageal reflux disease)    was on Nexium;taking Omeprazole daily as needed  . H/O hiatal hernia   . Headache(784.0)    "q 2-3 weeks; daily last 2 wks" (12/17/2016)  . Hyperlipidemia    takes Ramipril daily  . Hypertension    takes Ramipril daily  . Joint pain   . Joint swelling   . Nocturia   . Numbness    both arms   . Pneumonia    hx of-2014  . PONV (postoperative nausea and vomiting)    Pt reports nausea only.  . Sleep apnea    hx (12/17/2016)  . Stroke [facility]) 2015   "drag left foot more since; have to wear glasses now" (12/17/2016)  . Type II diabetes mellitus (HCC)    takes Metformin  daily  . Umbilical hernia   . Urinary frequency     Patient Active Problem List   Diagnosis Date Noted  . Atrial fibrillation with RVR (HCC) 12/17/2016  . Carotid stenosis, symptomatic w/o infarct 09/25/2015  . Paresthesia and pain of right extremity 12/04/2014  . Carotid stenosis 12/04/2014  . Stroke, hemorrhagic (HCC) 09/01/2014  . 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 TIA (transient ischemic attack) 08/14/2014  . Right arm weakness   . Cerebral hemorrhage (HCC) 08/11/2014  . ICH (intracerebral hemorrhage) (HCC) 08/11/2014  . Brain aneurysm 08/08/2014  . Chest pain at rest 07/08/2014  . right ICAO (internal carotid artery occlusion) 07/06/2014  . 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 Hyperlipidemia LDL goal <100 07/06/2014  . Morbid obesity (HCC) 07/06/2014  . Acute CVA (cerebrovascular accident) (HCC) 07/03/2014  . GERD (gastroesophageal reflux disease) 10/19/2012  . COPD (chronic obstructive pulmonary disease) (HCC) 06/12/2012    Past Surgical History:  Procedure Laterality Date  . ANEURYSM COILING  2015  . CARDIOVERSION N/A 12/18/2016   Procedure: CARDIOVERSION;  Surgeon: Orpah Cobb, MD;  Location: [facility] ENDOSCOPY;  Service: Cardiovascular;  Laterality: N/A;  . COLONOSCOPY    . EXCISIONAL HEMORRHOIDECTOMY  1980s   "soon after rectal OR"  . GANGLION CYST EXCISION Right   . RADIOLOGY WITH ANESTHESIA N/A 08/08/2014   Procedure: RADIOLOGY WITH ANESTHESIA EMBOLIZATION;  Surgeon: Medication Radiologist, MD;  Location: [facility] OR;  Service: Radiology;  Laterality: N/A;  . RADIOLOGY WITH ANESTHESIA N/A 11/21/2014   Procedure: RADIOLOGY WITH ANESTHESIA;  Surgeon: Oneal Grout, MD;  Location: [facility] OR;  Service: Radiology;  Laterality: N/A;  . RADIOLOGY WITH ANESTHESIA N/A 02/27/2015   Procedure: RADIOLOGY WITH ANESTHESIA;  Surgeon: [name], MD;  Location: [facility] OR;  Service: Radiology;  Laterality: N/A;  . RADIOLOGY  WITH ANESTHESIA N/A 09/25/2015   Procedure: RADIOLOGY WITH ANESTHESIA;  Surgeon: [name], MD;  Location: [facility] OR;  Service: Radiology;  Laterality: N/A;  . RECTAL SURGERY  1980s   "tore it lifting heavy furniture; sewed it back together"  . SHOULDER ARTHROSCOPY WITH OPEN ROTATOR CUFF REPAIR Right 2016  . SHOULDER ARTHROSCOPY WITH ROTATOR CUFF REPAIR Left 1996  . SHOULDER OPEN ROTATOR CUFF REPAIR Left 1997   "took out 8inches of my collarbone"  . TEE WITHOUT CARDIOVERSION N/A 12/18/2016   Procedure: TRANSESOPHAGEAL ECHOCARDIOGRAM (TEE);  Surgeon: Orpah Cobb, MD;  Location: [facility] ENDOSCOPY;  Service: Cardiovascular;  Laterality: N/A;  . TRACHEOSTOMY  1964   at age 61 months old; for asthma  . TRACHEOSTOMY CLOSURE         Home Medications    Prior to Admission medications   Medication Sig Start Date End Date Taking? Authorizing Provider  acetaminophen (TYLENOL) 500 MG tablet Take 1 tablet (500 mg total) by mouth every 6 (six) hours as needed. Patient taking differently: Take 500 mg by mouth every 6 (six) hours as needed for moderate pain or headache.  11/18/16   Everlene Farrier, PA-C  albuterol (PROVENTIL HFA;VENTOLIN HFA) 108 (90 BASE) MCG/ACT inhaler Inhale 2 puffs into the lungs every 6 (six) hours as needed for wheezing or shortness of breath.     [provider]  amiodarone (PACERONE) 200 MG tablet Take 1 tablet (200 mg total) by mouth daily. 12/20/16   Orpah Cobb, MD  apixaban (ELIQUIS) 5 MG TABS tablet Take 1 tablet (5 mg total) by mouth 2 (two) times daily. 12/19/16   Orpah Cobb, MD  aspirin EC 81 MG tablet Take 81 mg by mouth every other day.    [provider]  atorvastatin (LIPITOR) 20 MG tablet Take 1 tablet (20 mg total) by mouth daily at 6 PM. 07/06/14   Calvert Cantor, MD  clopidogrel (PLAVIX) 75 MG tablet Take 0.5 tablets (37.5 mg total) by mouth daily. Patient taking differently: Take 37.5 mg by mouth every other day.  08/14/14   Jeralyn Bennett, MD    cyclobenzaprine (FLEXERIL) 5 MG tablet Take 5 mg by mouth at bedtime.    [provider]  esomeprazole (NEXIUM) 40 MG capsule Take 40 mg by mouth at bedtime as needed (for acid reflux).     [provider]  gabapentin (NEURONTIN) 100 MG capsule TAKE 1 CAPSULE BY MOUTH THREE TIMES DAILY 08/25/15   Marvel Plan, MD  hydrochlorothiazide (HYDRODIURIL) 25 MG tablet Take 0.5 tablets (12.5 mg total) by mouth daily. 12/19/16   Orpah Cobb, MD  metFORMIN (GLUCOPHAGE) 500 MG tablet Take 1 tablet (500 mg total) by mouth 2 (two) times daily with a meal. Patient taking differently: Take 500 mg by mouth daily with breakfast.  07/06/14  Calvert Cantorizwan, Saima, MD  nitroGLYCERIN (NITROSTAT) 0.4 MG SL tablet Place 1 tablet (0.4 mg total) under the tongue every 5 (five) minutes x 3 doses as needed for chest pain. 07/08/14   Orpah CobbKadakia, Ajay, MD  ramipril (ALTACE) 5 MG capsule Take 5 mg by mouth daily. 09/03/15   [provider]  umeclidinium-vilanterol (ANORO ELLIPTA) 62.5-25 MCG/INH AEPB Inhale 1 puff into the lungs daily.    [provider]    Family History Family History  Problem Relation Age of Onset  . Heart disease Mother        s/p 3V CABG  . Cancer Father 2772       lung cancer; +tobacco  . Stroke Father   . Cancer Maternal Grandmother   . Heart disease Maternal Grandmother   . Cancer Paternal Grandmother   . Lupus Sister   . Multiple sclerosis Sister   . Anemia Daughter     Social History Social History  Substance Use Topics  . Smoking status: Former Smoker    Packs/day: 1.50    Years: 25.00    Types: Cigarettes    Start date: 01/15/1975    Quit date: 06/05/2014  . Smokeless tobacco: Never Used  . Alcohol use 3.0 oz/week    5 Cans of beer per week     Comment: 12/17/2016 "4-5 beers q Wednesday"     Allergies   Tylox [oxycodone-acetaminophen] and Adhesive [tape]   Review of Systems Review of Systems  Constitutional: Negative for appetite change, chills,  diaphoresis, fatigue and fever.  HENT: Negative for mouth sores, sore throat and trouble swallowing.   Eyes: Negative for visual disturbance.  Respiratory: Negative for cough, chest tightness, shortness of breath and wheezing.   Cardiovascular: Negative for chest pain.  Gastrointestinal: Negative for abdominal distention, abdominal pain, diarrhea, nausea and vomiting.  Endocrine: Negative for polydipsia, polyphagia and polyuria.  Genitourinary: Negative for dysuria, frequency and hematuria.  Musculoskeletal: Negative for gait problem.  Skin: Positive for rash. Negative for color change and pallor.  Neurological: Negative for dizziness, syncope, light-headedness and headaches.  Hematological: Does not bruise/bleed easily.  Psychiatric/Behavioral: Negative for behavioral problems and confusion.     Physical Exam Updated Vital Signs BP 125/85 (BP Location: Right Arm)   Pulse 64   Temp 97.6 F (36.4 C) (Oral)   Resp 16   Wt 110.7 kg (244 lb)   SpO2 95%   BMI 37.10 kg/m   Physical Exam  Constitutional: He is oriented to person, place, and time. He appears well-developed and well-nourished. No distress.  HENT:  Head: Normocephalic.  Eyes: Conjunctivae are normal. Pupils are equal, round, and reactive to light. No scleral icterus.  Neck: Normal range of motion. Neck supple. No thyromegaly present.  Cardiovascular: Normal rate and regular rhythm.  Exam reveals no gallop and no friction rub.   No murmur heard. Pulmonary/Chest: Effort normal and breath sounds normal. No respiratory distress. He has no wheezes. He has no rales.  Abdominal: Soft. Bowel sounds are normal. He exhibits no distension. There is no tenderness. There is no rebound.  Musculoskeletal: Normal range of motion.  Neurological: He is alert and oriented to person, place, and time.  Skin: Skin is warm and dry. Rash noted.  Patient with multiple areas of nonblanching petechiae to his bilateral lower extremities  symmetric. Some areas of confluence. This ends abruptly at his sock line. He has no abnormalities on his feet and ankles. No abnormalities of the palms or hands. Stable vital signs and  afebrile.  Psychiatric: He has a normal mood and affect. His behavior is normal.     ED Treatments / Results  Labs (all labs ordered are listed, but only abnormal results are displayed) Labs Reviewed  COMPREHENSIVE METABOLIC PANEL - Abnormal; Notable for the following:       Result Value   Chloride 100 (*)    Glucose, Bld 100 (*)    All other components within normal limits  CBG MONITORING, ED - Abnormal; Notable for the following:    Glucose-Capillary 102 (*)    All other components within normal limits  CBC WITH DIFFERENTIAL/PLATELET    EKG  EKG Interpretation None       Radiology Ct Angio Head W Or Wo Contrast  Result Date: 03/29/2017 CLINICAL DATA:  Episode of slurred speech and headaches. Personal history of left MCA aneurysm coiled 2015 and right carotid stent 2016. EXAM: CT ANGIOGRAPHY HEAD AND NECK TECHNIQUE: Multidetector CT imaging of the head and neck was performed using the standard protocol during bolus administration of intravenous contrast. Multiplanar CT image reconstructions and MIPs were obtained to evaluate the vascular anatomy. Carotid stenosis measurements (when applicable) are obtained utilizing NASCET criteria, using the distal internal carotid diameter as the denominator. CONTRAST:  50 mL Isovue 370 COMPARISON:  CT head without contrast 09/21/2016. Cerebral arteriogram 09/25/2015. Cerebral arteriogram 02/27/2015. FINDINGS: CT HEAD FINDINGS Brain: A remote right frontal lobe a particular infarct is stable. There is associated ex vacuo dilation of the right lateral ventricle. Infarct involves the lentiform nucleus. No new infarct is present. The basal ganglia are otherwise intact. No other acute or focal cortical lesion is present. The brainstem and cerebellum are normal. Vascular: A  left MCA bifurcation stent is present. Previously coiled left MCA bifurcation aneurysm is noted. No hyperdense vessel is present. Skull: The calvarium is intact. No acute or healing fracture is present. No focal lytic or blastic lesion is present. Sinuses: The paranasal sinuses and mastoid air cells are clear. Orbits: The globes and orbits are within normal limits. Review of the MIP images confirms the above findings CTA NECK FINDINGS Aortic arch: A 3 vessel arch configuration is present. There is minimal calcification along the undersurface of the aorta without aneurysm. Great vessel origins are within normal limits. Right carotid system: The right common carotid artery is within normal limits. Right carotid bifurcation stent is in place. The stent is patent. The post stent right internal carotid artery is within normal limits. Left carotid system: The left common carotid artery is within normal limits. Calcified and noncalcified atherosclerotic disease is evident at the proximal left internal carotid artery without significant luminal narrowing relative to the more distal vessel. Vertebral arteries: The vertebral arteries originate from the subclavian arteries bilaterally. The vertebral arteries are codominant. There is no focal stenosis or vascular injury to either vertebral artery in the neck. Skeleton: Mild endplate changes and uncovertebral spurring are present in the cervical spine. This is most significant at C5-6 with right greater than left osseous foraminal narrowing. The patient is edentulous. No focal lytic or blastic lesions are present. The skullbase is within normal limits. Other neck: The soft tissues of the neck are otherwise unremarkable. The thyroid is within normal limits. The larynx and pharynx are unremarkable. The salivary glands are within normal limits. No significant adenopathy is present. Upper chest: The lung apices are clear. The upper mediastinum is within normal limits. Review of the  MIP images confirms the above findings CTA HEAD FINDINGS Anterior circulation: Approximately  50% narrowing is present in the cavernous right internal carotid artery relative to the ICA terminus. There is more robust flow in the left internal carotid artery. A 2.5 mm posterior left A1 segment aneurysm is stable. The A1 segments are otherwise normal. The anterior communicating artery is patent. The right M1 segment is normal. A stent in the left M1 segment terminating in an M2 branch is patent. Coil pack is noted within the left MCA bifurcation aneurysm without evidence for residual or recurrent aneurysm. The right MCA bifurcation aneurysm is similar in size measuring 5 x 4 x 3 mm. Distal branch vessels opacify on both sides. There is a relative paucity of vessels extending to the infarcted territory. Posterior circulation: The vertebral arteries are codominant. PICA origins are visualized and normal. Both posterior cerebral arteries originate from the basilar tip. The PCA branch vessels are within normal limits without a significant proximal stenosis or occlusion. There is some attenuation of distal PCA branch vessels bilaterally. Venous sinuses: The dural sinuses are patent. Transverse sinuses are codominant. The straight sinus and deep cerebral veins are intact. Cortical veins are unremarkable. Anatomic variants: None Delayed phase: The postcontrast images demonstrate no pathologic enhancement. Review of the MIP images confirms the above findings IMPRESSION: 1. The right carotid bifurcation stent is patent. 2. Slight decreased size of the cavernous internal carotid artery on the left may reflect some flow limitation on the right. 3. 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 4. Left M1 segment and MCA bifurcation stent and aneurysm coils without evidence for residual or recurrent aneurysm or distal occlusion. 5. Stable mm right carotid bifurcation aneurysm. 6.  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 Electronically Signed   By: Marin Roberts M.D.   On: 03/29/2017 13:51   Ct Angio Neck W Or Wo Contrast  Result Date: 03/29/2017 CLINICAL DATA:  Episode of slurred speech and headaches. Personal history of left MCA aneurysm coiled 2015 and right carotid stent 2016. EXAM: CT ANGIOGRAPHY HEAD AND NECK TECHNIQUE: Multidetector CT imaging of the head and neck was performed using the standard protocol during bolus administration of intravenous contrast. Multiplanar CT image reconstructions and MIPs were obtained to evaluate the vascular anatomy. Carotid stenosis measurements (when applicable) are obtained utilizing NASCET criteria, using the distal internal carotid diameter as the denominator. CONTRAST:  50 mL Isovue 370 COMPARISON:  CT head without contrast 09/21/2016. Cerebral arteriogram 09/25/2015. Cerebral arteriogram 02/27/2015. FINDINGS: CT HEAD FINDINGS Brain: A remote right frontal lobe a particular infarct is stable. There is associated ex vacuo dilation of the right lateral ventricle. Infarct involves the lentiform nucleus. No new infarct is present. The basal ganglia are otherwise intact. No other acute or focal cortical lesion is present. The brainstem and cerebellum are normal. Vascular: A left MCA bifurcation stent is present. Previously coiled left MCA bifurcation aneurysm is noted. No hyperdense vessel is present. Skull: The calvarium is intact. No acute or healing fracture is present. No focal lytic or blastic lesion is present. Sinuses: The paranasal sinuses and mastoid air cells are clear. Orbits: The globes and orbits are within normal limits. Review of the MIP images confirms the above findings CTA NECK FINDINGS Aortic arch: A 3 vessel arch configuration is present. There is minimal calcification along the undersurface of the aorta without aneurysm. Great vessel origins are within normal limits. Right carotid system: The right common carotid artery  is within normal limits. Right carotid bifurcation stent is in place.  The stent is patent. The post stent right internal carotid artery is within normal limits. Left carotid system: The left common carotid artery is within normal limits. Calcified and noncalcified atherosclerotic disease is evident at the proximal left internal carotid artery without significant luminal narrowing relative to the more distal vessel. Vertebral arteries: The vertebral arteries originate from the subclavian arteries bilaterally. The vertebral arteries are codominant. There is no focal stenosis or vascular injury to either vertebral artery in the neck. Skeleton: Mild endplate changes and uncovertebral spurring are present in the cervical spine. This is most significant at C5-6 with right greater than left osseous foraminal narrowing. The patient is edentulous. No focal lytic or blastic lesions are present. The skullbase is within normal limits. Other neck: The soft tissues of the neck are otherwise unremarkable. The thyroid is within normal limits. The larynx and pharynx are unremarkable. The salivary glands are within normal limits. No significant adenopathy is present. Upper chest: The lung apices are clear. The upper mediastinum is within normal limits. Review of the MIP images confirms the above findings CTA HEAD FINDINGS Anterior circulation: Approximately 50% narrowing is present in the cavernous right internal carotid artery relative to the ICA terminus. There is more robust flow in the left internal carotid artery. A 2.5 mm posterior left A1 segment aneurysm is stable. The A1 segments are otherwise normal. The anterior communicating artery is patent. The right M1 segment is normal. A stent in the left M1 segment terminating in an M2 branch is patent. Coil pack is noted within the left MCA bifurcation aneurysm without evidence for residual or recurrent aneurysm. The right MCA bifurcation aneurysm is similar in size measuring 5 x 4  x 3 mm. Distal branch vessels opacify on both sides. There is a relative paucity of vessels extending to the infarcted territory. Posterior circulation: The vertebral arteries are codominant. PICA origins are visualized and normal. Both posterior cerebral arteries originate from the basilar tip. The PCA branch vessels are within normal limits without a significant proximal stenosis or occlusion. There is some attenuation of distal PCA branch vessels bilaterally. Venous sinuses: The dural sinuses are patent. Transverse sinuses are codominant. The straight sinus and deep cerebral veins are intact. Cortical veins are unremarkable. Anatomic variants: None Delayed phase: The postcontrast images demonstrate no pathologic enhancement. Review of the MIP images confirms the above findings IMPRESSION: 1. The right carotid bifurcation stent is patent. 2. Slight decreased size of the cavernous internal carotid artery on the left may reflect some flow limitation on the right. 3. 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 4. Left M1 segment and MCA bifurcation stent and aneurysm coils without evidence for residual or recurrent aneurysm or distal occlusion. 5. Stable mm right carotid bifurcation aneurysm. 6. 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 Electronically Signed   By: Marin Roberts M.D.   On: 03/29/2017 13:51    Procedures Procedures (including critical care time)  Medications Ordered in ED Medications - No data to display   Initial Impression / Assessment and Plan / ED Course  I have reviewed the triage vital signs and the nursing notes.  Pertinent labs & imaging results that were available during my care of the patient were reviewed by me and considered in my medical decision making (see chart for details).    Normal platelet count. I do not think this represents HSP other systemic vasculitis. He is very likely traumatic petechiae that it  worsened  because of his activity yesterday. Is encouraged to elevate his feet, avoid strenuous activity for the next few days. Use compression stockings. He states he has "diabetics socks" home. Rest and recheck with any additional symptoms including fever. Otherwise primary care follow-up. Continue Eliquis  Final Clinical Impressions(s) / ED Diagnoses   Final diagnoses:  Rash    New Prescriptions New Prescriptions   No medications on file     Rolland Porter, MD 03/30/17 2217    Rolland Porter, MD 03/30/17 2217

## 2017-03-30 NOTE — Discharge Instructions (Signed)
Elevate legs, and use support hose. Recheck with PCP if not improving. Return to ER with any rash ANYWHERE else other than your legs. Also return with fever, or other changes.

## 2017-04-16 ENCOUNTER — Other Ambulatory Visit (HOSPITAL_COMMUNITY): Payer: Self-pay | Admitting: Interventional Radiology

## 2017-04-16 DIAGNOSIS — I671 Cerebral aneurysm, nonruptured: Secondary | ICD-10-CM

## 2017-04-16 DIAGNOSIS — I771 Stricture of artery: Secondary | ICD-10-CM

## 2017-04-21 ENCOUNTER — Other Ambulatory Visit: Payer: Self-pay | Admitting: Radiology

## 2017-04-22 ENCOUNTER — Ambulatory Visit (HOSPITAL_COMMUNITY)
Admission: RE | Admit: 2017-04-22 | Discharge: 2017-04-22 | Disposition: A | Discharge status: Home / Self Care | Payer: Medicare Other | Source: Ambulatory Visit | Attending: Interventional Radiology | Admitting: Interventional Radiology

## 2017-04-22 ENCOUNTER — Other Ambulatory Visit (HOSPITAL_COMMUNITY): Payer: Self-pay | Admitting: Interventional Radiology

## 2017-04-22 ENCOUNTER — Encounter (HOSPITAL_COMMUNITY): Payer: Self-pay

## 2017-04-22 DIAGNOSIS — R51 Headache: Secondary | ICD-10-CM | POA: Diagnosis present

## 2017-04-22 DIAGNOSIS — I129 Hypertensive chronic kidney disease with stage 1 through stage 4 chronic kidney disease, or unspecified chronic kidney disease: Secondary | ICD-10-CM | POA: Diagnosis not present

## 2017-04-22 DIAGNOSIS — F319 Bipolar disorder, unspecified: Secondary | ICD-10-CM | POA: Insufficient documentation

## 2017-04-22 DIAGNOSIS — Z7984 Long term (current) use of oral hypoglycemic drugs: Secondary | ICD-10-CM | POA: Diagnosis not present

## 2017-04-22 DIAGNOSIS — M545 Low back pain: Secondary | ICD-10-CM | POA: Diagnosis not present

## 2017-04-22 DIAGNOSIS — K219 Gastro-esophageal reflux disease without esophagitis: Secondary | ICD-10-CM | POA: Insufficient documentation

## 2017-04-22 DIAGNOSIS — J449 Chronic obstructive pulmonary disease, unspecified: Secondary | ICD-10-CM | POA: Diagnosis not present

## 2017-04-22 DIAGNOSIS — G8929 Other chronic pain: Secondary | ICD-10-CM | POA: Insufficient documentation

## 2017-04-22 DIAGNOSIS — Z8673 Personal history of transient ischemic attack (TIA), and cerebral infarction without residual deficits: Secondary | ICD-10-CM | POA: Diagnosis not present

## 2017-04-22 DIAGNOSIS — M1990 Unspecified osteoarthritis, unspecified site: Secondary | ICD-10-CM | POA: Diagnosis not present

## 2017-04-22 DIAGNOSIS — E785 Hyperlipidemia, unspecified: Secondary | ICD-10-CM | POA: Diagnosis not present

## 2017-04-22 DIAGNOSIS — I671 Cerebral aneurysm, nonruptured: Secondary | ICD-10-CM | POA: Insufficient documentation

## 2017-04-22 DIAGNOSIS — I4891 Unspecified atrial fibrillation: Secondary | ICD-10-CM | POA: Diagnosis not present

## 2017-04-22 DIAGNOSIS — I771 Stricture of artery: Secondary | ICD-10-CM

## 2017-04-22 DIAGNOSIS — E1122 Type 2 diabetes mellitus with diabetic chronic kidney disease: Secondary | ICD-10-CM | POA: Insufficient documentation

## 2017-04-22 DIAGNOSIS — Z7901 Long term (current) use of anticoagulants: Secondary | ICD-10-CM | POA: Insufficient documentation

## 2017-04-22 DIAGNOSIS — G4730 Sleep apnea, unspecified: Secondary | ICD-10-CM | POA: Diagnosis not present

## 2017-04-22 DIAGNOSIS — Z7982 Long term (current) use of aspirin: Secondary | ICD-10-CM | POA: Insufficient documentation

## 2017-04-22 DIAGNOSIS — Z87891 Personal history of nicotine dependence: Secondary | ICD-10-CM | POA: Diagnosis not present

## 2017-04-22 DIAGNOSIS — N189 Chronic kidney disease, unspecified: Secondary | ICD-10-CM | POA: Insufficient documentation

## 2017-04-22 HISTORY — DX: Unspecified atrial fibrillation: I48.91

## 2017-04-22 HISTORY — PX: IR ANGIO VERTEBRAL SEL VERTEBRAL UNI R MOD SED: IMG5368

## 2017-04-22 HISTORY — PX: IR ANGIO INTRA EXTRACRAN SEL COM CAROTID INNOMINATE BILAT MOD SED: IMG5360

## 2017-04-22 HISTORY — PX: IR ANGIO VERTEBRAL SEL SUBCLAVIAN INNOMINATE UNI L MOD SED: IMG5364

## 2017-04-22 LAB — CBC
HEMATOCRIT: 40.5 % (ref 39.0–52.0)
HEMOGLOBIN: 13.5 g/dL (ref 13.0–17.0)
MCH: 29.5 pg (ref 26.0–34.0)
MCHC: 33.3 g/dL (ref 30.0–36.0)
MCV: 88.4 fL (ref 78.0–100.0)
PLATELETS: 224 10*3/uL (ref 150–400)
RBC: 4.58 MIL/uL (ref 4.22–5.81)
RDW: 12.6 % (ref 11.5–15.5)
WBC: 6.6 10*3/uL (ref 4.0–10.5)

## 2017-04-22 LAB — BASIC METABOLIC PANEL
ANION GAP: 8 (ref 5–15)
BUN: 9 mg/dL (ref 6–20)
CALCIUM: 9 mg/dL (ref 8.9–10.3)
CO2: 28 mmol/L (ref 22–32)
Chloride: 102 mmol/L (ref 101–111)
Creatinine, Ser: 0.96 mg/dL (ref 0.61–1.24)
Glucose, Bld: 113 mg/dL — ABNORMAL HIGH (ref 65–99)
POTASSIUM: 3.6 mmol/L (ref 3.5–5.1)
SODIUM: 138 mmol/L (ref 135–145)

## 2017-04-22 LAB — PROTIME-INR
INR: 0.99
Prothrombin Time: 13.1 seconds (ref 11.4–15.2)

## 2017-04-22 LAB — APTT: APTT: 30 s (ref 24–36)

## 2017-04-22 MED ORDER — IOPAMIDOL (ISOVUE-300) INJECTION 61%
INTRAVENOUS | Status: AC
  Administered 2017-04-22: 10 mL
  Filled 2017-04-22: qty 50

## 2017-04-22 MED ORDER — SODIUM CHLORIDE 0.9 % IV SOLN: INTRAVENOUS | Status: AC

## 2017-04-22 MED ORDER — MIDAZOLAM HCL 2 MG/2ML IJ SOLN
INTRAMUSCULAR | Status: AC | PRN
  Administered 2017-04-22: 1 mg via INTRAVENOUS

## 2017-04-22 MED ORDER — LIDOCAINE HCL (PF) 1 % IJ SOLN
INTRAMUSCULAR | Status: AC
  Filled 2017-04-22: qty 30

## 2017-04-22 MED ORDER — LIDOCAINE HCL 1 % IJ SOLN
INTRAMUSCULAR | Status: AC | PRN
  Administered 2017-04-22: 15 mL

## 2017-04-22 MED ORDER — SODIUM CHLORIDE 0.9 % IV SOLN
Freq: Once | INTRAVENOUS | Status: AC
  Administered 2017-04-22: 08:00:00 via INTRAVENOUS

## 2017-04-22 MED ORDER — FENTANYL CITRATE (PF) 100 MCG/2ML IJ SOLN
INTRAMUSCULAR | Status: AC | PRN
  Administered 2017-04-22: 25 ug via INTRAVENOUS

## 2017-04-22 MED ORDER — HEPARIN SODIUM (PORCINE) 1000 UNIT/ML IJ SOLN
INTRAMUSCULAR | Status: AC
  Filled 2017-04-22: qty 2

## 2017-04-22 MED ORDER — MIDAZOLAM HCL 2 MG/2ML IJ SOLN
INTRAMUSCULAR | Status: AC
  Filled 2017-04-22: qty 2

## 2017-04-22 MED ORDER — FENTANYL CITRATE (PF) 100 MCG/2ML IJ SOLN
INTRAMUSCULAR | Status: AC
  Filled 2017-04-22: qty 2

## 2017-04-22 MED ORDER — IOPAMIDOL (ISOVUE-300) INJECTION 61%
INTRAVENOUS | Status: AC
  Administered 2017-04-22: 65 mL
  Filled 2017-04-22: qty 150

## 2017-04-22 MED ORDER — HEPARIN SODIUM (PORCINE) 1000 UNIT/ML IJ SOLN
INTRAMUSCULAR | Status: AC | PRN
  Administered 2017-04-22: 1000 [IU] via INTRAVENOUS

## 2017-04-22 NOTE — Procedures (Signed)
S/O 4 vessel certebral arteriogram. RT CFA approach. Findings. 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 2.Approx 4.337mm x 4.4 mm RT MCA bifurcation aneurysm 3.Approx 2.688mm x 1.739mm prox Lt ACA A 1 seg aneurysm

## 2017-04-22 NOTE — Sedation Documentation (Signed)
5 Fr. Exoseal to right groin 

## 2017-04-22 NOTE — Sedation Documentation (Signed)
ETC02 removed per Dr. Deveshwar  

## 2017-04-22 NOTE — H&P (Signed)
Chief Complaint: Patient was seen in consultation today for headaches  Supervising Physician: Julieanne Cottoneveshwar, Sanjeev  Patient Status: [facility] - Out[facility]pt  History of Present Illness: [name] is a 56 y.o. 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  Since this time he has continued with intermittent headaches.   He underwent CT Angio Head/Neck 03/29/17 which showed: 1. The right carotid bifurcation stent is patent. 2. Slight decreased size of the cavernous internal carotid artery on the left may reflect some flow limitation on the right. 3. 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 4. Left M1 segment and MCA bifurcation stent and aneurysm coils without evidence for residual or recurrent aneurysm or distal occlusion. 5. Stable mm right carotid bifurcation aneurysm. 6. 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  Request was made for cerebral angiogram.   Patient presents for procedure today in his usual state of health.  He is currently taking Xarelto which he stopped on Tuesday.  Past Medical History:  Diagnosis Date  . A-fib (HCC)   . Aneurysm (HCC)    brain  . Arthritis   . Atrial fibrillation (HCC)   . Bipolar disorder (HCC)    was on meds but was taken off 2 yrs ago and none since  . Burning pain    in both legs-seeing Dr.Sethi for this  . Cataracts, bilateral   . Childhood asthma    Last asthma attack at age 56; History of trach at 16 months (12/17/2016)  . Chronic kidney disease 2009  . Chronic lower back pain   . COPD (chronic obstructive pulmonary disease) (HCC) 06/2012   uses Spiriva and Albuterol daily as needed  . Depression   . Family history of adverse reaction to anesthesia     "Mom and sister have PONV"  . GERD (gastroesophageal reflux disease)    was on Nexium;taking Omeprazole daily as needed  . H/O hiatal hernia   . Headache(784.0)    "q 2-3 weeks; daily last 2 wks" (12/17/2016)  . Hyperlipidemia    takes Ramipril daily  . Hypertension    takes Ramipril daily  . Joint pain   . Joint swelling   . Nocturia   . Numbness    both arms   . Pneumonia    hx of-2014  . PONV (postoperative nausea and vomiting)    Pt reports nausea only.  . Sleep apnea    hx (12/17/2016)  . Stroke [facility](HCC) 2015   "drag left foot more since; have to wear glasses now" (12/17/2016)  . Type II diabetes mellitus (HCC)    takes Metformin daily  . Umbilical hernia   . Urinary frequency     Past Surgical History:  Procedure Laterality Date  . ANEURYSM COILING  2015  . CARDIOVERSION N/A 12/18/2016   Procedure: CARDIOVERSION;  Surgeon: Orpah CobbAjay Kadakia, MD;  [facility];  Service: Cardiovascular;  Laterality: N/A;  . COLONOSCOPY    . EXCISIONAL HEMORRHOIDECTOMY  1980s   "soon after rectal OR"  . GANGLION CYST EXCISION Right   . RADIOLOGY WITH ANESTHESIA N/A 08/08/2014   Procedure: RADIOLOGY WITH ANESTHESIA EMBOLIZATION;  Surgeon: Medication Radiologist, MD;  Location: [facility] OR;  Service: Radiology;  Laterality: N/A;  . RADIOLOGY WITH ANESTHESIA N/A 11/21/2014   Procedure:  RADIOLOGY WITH ANESTHESIA;  Surgeon: Oneal Grout, MD;  Location: [facility] OR;  Service: Radiology;  Laterality: N/A;  . RADIOLOGY WITH ANESTHESIA N/A 02/27/2015   Procedure: RADIOLOGY WITH ANESTHESIA;  Surgeon: [name], MD;  Location: [facility] OR;  Service: Radiology;  Laterality: N/A;  . RADIOLOGY WITH ANESTHESIA N/A 09/25/2015   Procedure: RADIOLOGY WITH ANESTHESIA;  Surgeon: [name], MD;  Location: [facility] OR;  Service: Radiology;  Laterality: N/A;  . RECTAL SURGERY  1980s   "tore it lifting heavy furniture; sewed it back together"  . SHOULDER ARTHROSCOPY WITH OPEN ROTATOR CUFF REPAIR Right 2016  .  SHOULDER ARTHROSCOPY WITH ROTATOR CUFF REPAIR Left 1996  . SHOULDER OPEN ROTATOR CUFF REPAIR Left 1997   "took out 8inches of my collarbone"  . TEE WITHOUT CARDIOVERSION N/A 12/18/2016   Procedure: TRANSESOPHAGEAL ECHOCARDIOGRAM (TEE);  Surgeon: Orpah Cobb, MD;  Location: [facility] ENDOSCOPY;  Service: Cardiovascular;  Laterality: N/A;  . TRACHEOSTOMY  1964   at age 51 months old; for asthma  . TRACHEOSTOMY CLOSURE      Allergies: Tylox [oxycodone-acetaminophen] and Adhesive [tape]  Medications: Prior to Admission medications   Medication Sig Start Date End Date Taking? Authorizing Provider  acetaminophen (TYLENOL) 500 MG tablet Take 1 tablet (500 mg total) by mouth every 6 (six) hours as needed. Patient taking differently: Take 500 mg by mouth every 6 (six) hours as needed for moderate pain or headache.  11/18/16  Yes Everlene Farrier, PA-C  amiodarone (PACERONE) 200 MG tablet Take 1 tablet (200 mg total) by mouth daily. 12/20/16  Yes Orpah Cobb, MD  apixaban (ELIQUIS) 5 MG TABS tablet Take 1 tablet (5 mg total) by mouth 2 (two) times daily. 12/19/16  Yes Orpah Cobb, MD  aspirin EC 81 MG tablet Take 81 mg by mouth every other day.   Yes [provider]  atorvastatin (LIPITOR) 20 MG tablet Take 1 tablet (20 mg total) by mouth daily at 6 PM. 07/06/14  Yes Rizwan, Ladell Heads, MD  esomeprazole (NEXIUM) 40 MG capsule Take 40 mg by mouth at bedtime as needed (for acid reflux).    Yes [provider]  gabapentin (NEURONTIN) 100 MG capsule TAKE 1 CAPSULE BY MOUTH THREE TIMES DAILY 08/25/15  Yes Marvel Plan, MD  hydrochlorothiazide (HYDRODIURIL) 25 MG tablet Take 0.5 tablets (12.5 mg total) by mouth daily. 12/19/16  Yes Orpah Cobb, MD  nitroGLYCERIN (NITROSTAT) 0.4 MG SL tablet Place 1 tablet (0.4 mg total) under the tongue every 5 (five) minutes x 3 doses as needed for chest pain. 07/08/14  Yes Orpah Cobb, MD  umeclidinium-vilanterol (ANORO ELLIPTA) 62.5-25 MCG/INH AEPB Inhale 1 puff  into the lungs daily.   Yes [provider]  albuterol (PROVENTIL HFA;VENTOLIN HFA) 108 (90 BASE) MCG/ACT inhaler Inhale 2 puffs into the lungs every 6 (six) hours as needed for wheezing or shortness of breath.     [provider]  metFORMIN (GLUCOPHAGE) 500 MG tablet Take 1 tablet (500 mg total) by mouth 2 (two) times daily with a meal. 07/06/14   Calvert Cantor, MD     Family History  Problem Relation Age of Onset  . Heart disease Mother        s/p 3V CABG  . Cancer Father 34       lung cancer; +tobacco  . Stroke Father   . Cancer Maternal Grandmother   . Heart disease Maternal Grandmother   . Cancer Paternal Grandmother   . Lupus Sister   . Multiple sclerosis Sister   .  Anemia Daughter     Social History   Social History  . Marital status: Divorced    Spouse name: not together since 2007  . Number of children: 3  . Years of education: 10   Occupational History  . [employer]    airport    Social History Main Topics  . Smoking status: Former Smoker    Packs/day: 1.50    Years: 25.00    Types: Cigarettes    Start date: 01/15/1975    Quit date: 06/05/2014  . Smokeless tobacco: Never Used  . Alcohol use 3.0 oz/week    5 Cans of beer per week     Comment: 12/17/2016 "4-5 beers q Wednesday"  . Drug use: No  . Sexual activity: No     Comment: partner has had surgery to prevent pregnancy   Other Topics Concern  . None   Social History Narrative   Lives with his son and his mother.  His son has no contact with the son's mother, though the patient is not legally separated from her.  She has a history of drug use and has been in prison several times.    Review of Systems  Constitutional: Negative for fatigue and fever.  Respiratory: Negative for cough and shortness of breath.   Cardiovascular: Negative for chest pain.  Neurological: Positive for headaches.  Psychiatric/Behavioral: Negative for behavioral problems and confusion.    Vital  Signs: BP 124/79   Pulse (!) 50   Temp 97.8 F (36.6 C) (Oral)   Resp 18   Ht 5\' 8"  (1.727 m)   Wt 254 lb (115.2 kg)   SpO2 97%   BMI 38.62 kg/m   Physical Exam  Constitutional: He is oriented to person, place, and time. He appears well-developed.  Cardiovascular: Normal rate, regular rhythm and normal heart sounds.   Pulmonary/Chest: Effort normal and breath sounds normal. No respiratory distress.  Neurological: He is alert and oriented to person, place, and time.  Skin: Skin is warm and dry.  Psychiatric: He has a normal mood and affect. His behavior is normal. Judgment and thought content normal.  Nursing note and vitals reviewed.   Mallampati Score:  MD Evaluation Airway: WNL Heart: WNL Abdomen: WNL Chest/ Lungs: WNL ASA  Classification: 3 Mallampati/Airway Score: Two  Imaging: Ct Angio Head W Or Wo Contrast  Result Date: 03/29/2017 CLINICAL DATA:  Episode of slurred speech and headaches. Personal history of left MCA aneurysm coiled 2015 and right carotid stent 2016. EXAM: CT ANGIOGRAPHY HEAD AND NECK TECHNIQUE: Multidetector CT imaging of the head and neck was performed using the standard protocol during bolus administration of intravenous contrast. Multiplanar CT image reconstructions and MIPs were obtained to evaluate the vascular anatomy. Carotid stenosis measurements (when applicable) are obtained utilizing NASCET criteria, using the distal internal carotid diameter as the denominator. CONTRAST:  50 mL Isovue 370 COMPARISON:  CT head without contrast 09/21/2016. Cerebral arteriogram 09/25/2015. Cerebral arteriogram 02/27/2015. FINDINGS: CT HEAD FINDINGS Brain: A remote right frontal lobe a particular infarct is stable. There is associated ex vacuo dilation of the right lateral ventricle. Infarct involves the lentiform nucleus. No new infarct is present. The basal ganglia are otherwise intact. No other acute or focal cortical lesion is present. The brainstem and cerebellum  are normal. Vascular: A left MCA bifurcation stent is present. Previously coiled left MCA bifurcation aneurysm is noted. No hyperdense vessel is present. Skull: The calvarium is intact. No acute or healing fracture is present.  No focal lytic or blastic lesion is present. Sinuses: The paranasal sinuses and mastoid air cells are clear. Orbits: The globes and orbits are within normal limits. Review of the MIP images confirms the above findings CTA NECK FINDINGS Aortic arch: A 3 vessel arch configuration is present. There is minimal calcification along the undersurface of the aorta without aneurysm. Great vessel origins are within normal limits. Right carotid system: The right common carotid artery is within normal limits. Right carotid bifurcation stent is in place. The stent is patent. The post stent right internal carotid artery is within normal limits. Left carotid system: The left common carotid artery is within normal limits. Calcified and noncalcified atherosclerotic disease is evident at the proximal left internal carotid artery without significant luminal narrowing relative to the more distal vessel. Vertebral arteries: The vertebral arteries originate from the subclavian arteries bilaterally. The vertebral arteries are codominant. There is no focal stenosis or vascular injury to either vertebral artery in the neck. Skeleton: Mild endplate changes and uncovertebral spurring are present in the cervical spine. This is most significant at C5-6 with right greater than left osseous foraminal narrowing. The patient is edentulous. No focal lytic or blastic lesions are present. The skullbase is within normal limits. Other neck: The soft tissues of the neck are otherwise unremarkable. The thyroid is within normal limits. The larynx and pharynx are unremarkable. The salivary glands are within normal limits. No significant adenopathy is present. Upper chest: The lung apices are clear. The upper mediastinum is within  normal limits. Review of the MIP images confirms the above findings CTA HEAD FINDINGS Anterior circulation: Approximately 50% narrowing is present in the cavernous right internal carotid artery relative to the ICA terminus. There is more robust flow in the left internal carotid artery. A 2.5 mm posterior left A1 segment aneurysm is stable. The A1 segments are otherwise normal. The anterior communicating artery is patent. The right M1 segment is normal. A stent in the left M1 segment terminating in an M2 branch is patent. Coil pack is noted within the left MCA bifurcation aneurysm without evidence for residual or recurrent aneurysm. The right MCA bifurcation aneurysm is similar in size measuring 5 x 4 x 3 mm. Distal branch vessels opacify on both sides. There is a relative paucity of vessels extending to the infarcted territory. Posterior circulation: The vertebral arteries are codominant. PICA origins are visualized and normal. Both posterior cerebral arteries originate from the basilar tip. The PCA branch vessels are within normal limits without a significant proximal stenosis or occlusion. There is some attenuation of distal PCA branch vessels bilaterally. Venous sinuses: The dural sinuses are patent. Transverse sinuses are codominant. The straight sinus and deep cerebral veins are intact. Cortical veins are unremarkable. Anatomic variants: None Delayed phase: The postcontrast images demonstrate no pathologic enhancement. Review of the MIP images confirms the above findings IMPRESSION: 1. The right carotid bifurcation stent is patent. 2. Slight decreased size of the cavernous internal carotid artery on the left may reflect some flow limitation on the right. 3. 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 4. Left M1 segment and MCA bifurcation stent and aneurysm coils without evidence for residual or recurrent aneurysm or distal occlusion. 5. Stable mm right  carotid bifurcation aneurysm. 6. 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 Electronically Signed   By: Marin Roberts M.D.   On: 03/29/2017 13:51   Ct Angio Neck W Or Wo Contrast  Result Date:  03/29/2017 CLINICAL DATA:  Episode of slurred speech and headaches. Personal history of left MCA aneurysm coiled 2015 and right carotid stent 2016. EXAM: CT ANGIOGRAPHY HEAD AND NECK TECHNIQUE: Multidetector CT imaging of the head and neck was performed using the standard protocol during bolus administration of intravenous contrast. Multiplanar CT image reconstructions and MIPs were obtained to evaluate the vascular anatomy. Carotid stenosis measurements (when applicable) are obtained utilizing NASCET criteria, using the distal internal carotid diameter as the denominator. CONTRAST:  50 mL Isovue 370 COMPARISON:  CT head without contrast 09/21/2016. Cerebral arteriogram 09/25/2015. Cerebral arteriogram 02/27/2015. FINDINGS: CT HEAD FINDINGS Brain: A remote right frontal lobe a particular infarct is stable. There is associated ex vacuo dilation of the right lateral ventricle. Infarct involves the lentiform nucleus. No new infarct is present. The basal ganglia are otherwise intact. No other acute or focal cortical lesion is present. The brainstem and cerebellum are normal. Vascular: A left MCA bifurcation stent is present. Previously coiled left MCA bifurcation aneurysm is noted. No hyperdense vessel is present. Skull: The calvarium is intact. No acute or healing fracture is present. No focal lytic or blastic lesion is present. Sinuses: The paranasal sinuses and mastoid air cells are clear. Orbits: The globes and orbits are within normal limits. Review of the MIP images confirms the above findings CTA NECK FINDINGS Aortic arch: A 3 vessel arch configuration is present. There is minimal calcification along the undersurface of the aorta without aneurysm. Great vessel origins are within normal limits. Right carotid system:  The right common carotid artery is within normal limits. Right carotid bifurcation stent is in place. The stent is patent. The post stent right internal carotid artery is within normal limits. Left carotid system: The left common carotid artery is within normal limits. Calcified and noncalcified atherosclerotic disease is evident at the proximal left internal carotid artery without significant luminal narrowing relative to the more distal vessel. Vertebral arteries: The vertebral arteries originate from the subclavian arteries bilaterally. The vertebral arteries are codominant. There is no focal stenosis or vascular injury to either vertebral artery in the neck. Skeleton: Mild endplate changes and uncovertebral spurring are present in the cervical spine. This is most significant at C5-6 with right greater than left osseous foraminal narrowing. The patient is edentulous. No focal lytic or blastic lesions are present. The skullbase is within normal limits. Other neck: The soft tissues of the neck are otherwise unremarkable. The thyroid is within normal limits. The larynx and pharynx are unremarkable. The salivary glands are within normal limits. No significant adenopathy is present. Upper chest: The lung apices are clear. The upper mediastinum is within normal limits. Review of the MIP images confirms the above findings CTA HEAD FINDINGS Anterior circulation: Approximately 50% narrowing is present in the cavernous right internal carotid artery relative to the ICA terminus. There is more robust flow in the left internal carotid artery. A 2.5 mm posterior left A1 segment aneurysm is stable. The A1 segments are otherwise normal. The anterior communicating artery is patent. The right M1 segment is normal. A stent in the left M1 segment terminating in an M2 branch is patent. Coil pack is noted within the left MCA bifurcation aneurysm without evidence for residual or recurrent aneurysm. The right MCA bifurcation aneurysm is  similar in size measuring 5 x 4 x 3 mm. Distal branch vessels opacify on both sides. There is a relative paucity of vessels extending to the infarcted territory. Posterior circulation: The vertebral arteries are codominant. PICA origins  are visualized and normal. Both posterior cerebral arteries originate from the basilar tip. The PCA branch vessels are within normal limits without a significant proximal stenosis or occlusion. There is some attenuation of distal PCA branch vessels bilaterally. Venous sinuses: The dural sinuses are patent. Transverse sinuses are codominant. The straight sinus and deep cerebral veins are intact. Cortical veins are unremarkable. Anatomic variants: None Delayed phase: The postcontrast images demonstrate no pathologic enhancement. Review of the MIP images confirms the above findings IMPRESSION: 1. The right carotid bifurcation stent is patent. 2. Slight decreased size of the cavernous internal carotid artery on the left may reflect some flow limitation on the right. 3. 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 4. Left M1 segment and MCA bifurcation stent and aneurysm coils without evidence for residual or recurrent aneurysm or distal occlusion. 5. Stable mm right carotid bifurcation aneurysm. 6. 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 Electronically Signed   By: Marin Roberts M.D.   On: 03/29/2017 13:51    Labs:  CBC:  Recent Labs  12/19/16 0408 01/12/17 1837 03/30/17 1945 04/22/17 0650  WBC 7.2 11.1* 7.4 6.6  HGB 12.8* 13.9 14.1 13.5  HCT 38.6* 41.3 42.2 40.5  PLT 183 228 244 224    COAGS:  Recent Labs  12/18/16 1018 04/22/17 0650  INR 1.02 0.99  APTT  --  30    BMP:  Recent Labs  12/18/16 1018 01/12/17 1837 03/29/17 1127 03/30/17 1945 04/22/17 0650  NA 139 137  --  138 138  K 3.9 3.8  --  3.7 3.6  CL 106 102  --  100* 102  CO2 26 26  --  28 28  GLUCOSE 119* 115*  --  100* 113*  BUN  9 18  --  14 9  CALCIUM 8.8* 8.7*  --  10.0 9.0  CREATININE 0.77 0.94 0.90 1.18 0.96  GFRNONAA >60 >60  --  >60 >60  GFRAA >60 >60  --  >60 >60    LIVER FUNCTION TESTS:  Recent Labs  10/19/16 0824 12/17/16 1817 03/30/17 1945  BILITOT 0.5 0.5 0.4  AST 19 19 22   ALT 21 20 21   ALKPHOS 51 52 63  PROT 6.5 6.3* 7.1  ALBUMIN 3.9 3.4* 4.4    TUMOR MARKERS: No results for input(s): AFPTM, CEA, CA199, CHROMGRNA in the last 8760 hours.  Assessment and Plan: Patient with past medical history of CVA, stenosis, and aneurysm diagnosed by previous angiogram presents with new complaint of recent headaches.  Patient was last seen in consultation with Dr. [name] in 03/2016.  He is currently only taking Xarelto which he has held since Tuesday.  He has been NPO today.  Risks and benefits discussed with the patient including, but not limited to bleeding, infection, vascular injury or contrast induced renal failure. All of the patient's questions were answered, patient is agreeable to proceed. Consent signed and in chart.  Thank you for this interesting consult.  I greatly enjoyed meeting [name] and look forward to participating in their care.  A copy of this report was sent to the requesting provider on this date.  Electronically Signed: Hoyt Koch, PA 04/22/2017, 8:39 AM   I spent a total of    15 Minutes in face to face in clinical consultation, greater than 50% of which was counseling/coordinating care for headaches

## 2017-04-22 NOTE — Discharge Instructions (Addendum)
Outpatient Metformin Instructions (Glucophage, Glucovance, Fortamet, Riomet, Metaglip, Glumetza, Actoplus met  Avandamet, Janumet)   Patient: [name]                                                04/22/2017:    Radiology Exam:     As part of your exam today in the Radiology Department, you were given a radiographic contrast material or x-ray dye.  Because you have had this contrast material and you are taking a Metformin drug (Glucophage, Glucovance, Avandamet, Fortamet, Riomet, Metaglip, Glumetza, Actoplus met, Actoplus Met XR, Prandimet or Janumet), please observe the following instructions:   DO NOT  Take this medication for 48 hours after your exam.  Because you have normal renal function and have no comorbidities, you may restart your medication in 48 hours with no need for a renal function test or consultation with your physician.  You have normal renal function but have some comorbidities.  Comorbidities include liver disease, alcohol overuse, heart failure, myocardial or muscular ischemia, sepsis, or other severe infection.  Therefore you should consult your physician before restarting your medication.  You have impaired renal function.  You should consult your physician before restarting your medication and you are advised to get a renal function test before restarting your medication.  Please discuss this with your physician.   Call your doctor before you start taking this medication again.  Your doctor may want to check your kidney function before you start taking this medication again.  I understand these instructions and have had an opportunity to discuss them with Radiology Department personnel.      Cerebral Angiogram, Care After Refer to this sheet in the next few weeks. These instructions provide you with information on caring for yourself after your procedure. Your health care provider may also give you more specific instructions. Your treatment has  been planned according to current medical practices, but problems sometimes occur. Call your health care provider if you have any problems or questions after your procedure. What can I expect after the procedure? After your procedure, it is typical to have the following:  Bruising at the catheter insertion site that usually fades within 1-2 weeks.  Blood collecting in the tissue (hematoma) that may be painful to the touch. It should usually decrease in size and tenderness within 1-2 weeks.  A mild headache.  Follow these instructions at home:  Take medicines only as directed by your health care provider.  You may shower 24-48 hours after the procedure or as directed by your health care provider. Remove the bandage (dressing) and gently wash the site with plain soap and water. Pat the area dry with a clean towel. Do not rub the site, because this may cause bleeding.  Do not take baths, swim, or use a hot tub until your health care provider approves.  Check your insertion site every day for redness, swelling, or drainage.  Do not apply powder or lotion to the site.  Do not lift over 10 lb (4.5 kg) for 5 days after your procedure or as directed by your health care provider.  Ask your health care provider when it is okay to: ? Return to work or school. ? Resume usual physical activities or sports. ? Resume sexual activity.  Do not drive home if you are discharged the same  day as the procedure. Have someone else drive you.  You may drive 24 hours after the procedure unless otherwise instructed by your health care provider.  Do not operate machinery or power tools for 24 hours after the procedure or as directed by your health care provider.  If your procedure was done as an outpatient procedure, which means that you went home the same day as your procedure, a responsible adult should be with you for the first 24 hours after you arrive home.  Keep all follow-up visits as directed by  your health care provider. This is important. Contact a health care provider if:  You have a fever.  You have chills.  You have increased bleeding from the catheter insertion site. Hold pressure on the site. Get help right away if:  You have vision changes or loss of vision.  You have numbness or weakness on one side of your body.  You have difficulty talking, or you have slurred speech or cannot speak (aphasia).  You feel confused or have difficulty remembering.  You have unusual pain at the catheter insertion site.  You have redness, warmth, or swelling at the catheter insertion site.  You have drainage (other than a small amount of blood on the dressing) from the catheter insertion site.  The catheter insertion site is bleeding, and the bleeding does not stop after 30 minutes of holding steady pressure on the site. These symptoms may represent a serious problem that is an emergency. Do not wait to see if the symptoms will go away. Get medical help right away. Call your local emergency services (911 in [place] Do not drive yourself to the hospital. This information is not intended to replace advice given to you by your health care provider. Make sure you discuss any questions you have with your health care provider. Document Released: 02/12/2014 Document Revised: 03/05/2016 Document Reviewed: 10/11/2013 Elsevier Interactive Patient Education  2017 [place]. Moderate Conscious Sedation, Adult, Care After These instructions provide you with information about caring for yourself after your procedure. Your health care provider may also give you more specific instructions. Your treatment has been planned according to current medical practices, but problems sometimes occur. Call your health care provider if you have any problems or questions after your procedure. What can I expect after the procedure? After your procedure, it is common:  To feel sleepy for several hours.  To feel  clumsy and have poor balance for several hours.  To have poor judgment for several hours.  To vomit if you eat too soon.  Follow these instructions at home: For at least 24 hours after the procedure:   Do not: ? Participate in activities where you could fall or become injured. ? Drive. ? Use heavy machinery. ? Drink alcohol. ? Take sleeping pills or medicines that cause drowsiness. ? Make important decisions or sign legal documents. ? Take care of children on your own.  Rest. Eating and drinking  Follow the diet recommended by your health care provider.  If you vomit: ? Drink water, juice, or soup when you can drink without vomiting. ? Make sure you have little or no nausea before eating solid foods. General instructions  Have a responsible adult stay with you until you are awake and alert.  Take over-the-counter and prescription medicines only as told by your health care provider.  If you smoke, do not smoke without supervision.  Keep all follow-up visits as told by your health care provider. This is important.  Contact a health care provider if:  You keep feeling nauseous or you keep vomiting.  You feel light-headed.  You develop a rash.  You have a fever. Get help right away if:  You have trouble breathing. This information is not intended to replace advice given to you by your health care provider. Make sure you discuss any questions you have with your health care provider. Document Released: 07/19/2013 Document Revised: 03/02/2016 Document Reviewed: 01/18/2016 Elsevier Interactive Patient Education  [employer].

## 2017-04-26 ENCOUNTER — Encounter (HOSPITAL_COMMUNITY): Payer: Self-pay | Admitting: Interventional Radiology

## 2017-04-30 ENCOUNTER — Ambulatory Visit (HOSPITAL_COMMUNITY)
Admission: RE | Admit: 2017-04-30 | Discharge: 2017-04-30 | Disposition: A | Discharge status: Home / Self Care | Payer: Medicare Other | Source: Ambulatory Visit | Attending: Interventional Radiology | Admitting: Interventional Radiology

## 2017-04-30 ENCOUNTER — Other Ambulatory Visit (HOSPITAL_COMMUNITY): Payer: Self-pay | Admitting: Interventional Radiology

## 2017-04-30 DIAGNOSIS — I671 Cerebral aneurysm, nonruptured: Secondary | ICD-10-CM

## 2017-04-30 HISTORY — PX: IR RADIOLOGIST EVAL & MGMT: IMG5224

## 2017-05-04 ENCOUNTER — Other Ambulatory Visit: Payer: Self-pay | Admitting: General Surgery

## 2017-05-04 ENCOUNTER — Encounter (HOSPITAL_COMMUNITY): Payer: Self-pay | Admitting: Interventional Radiology

## 2017-05-04 ENCOUNTER — Telehealth (HOSPITAL_COMMUNITY): Payer: Self-pay | Admitting: Radiology

## 2017-05-04 NOTE — Pre-Procedure Instructions (Addendum)
[name]  05/04/2017      [facility] [phone] [place] - [address] [facility] [address] [place] [id number] Phone: [phone] Fax: [phone]    Your procedure is scheduled on May 10, 2017  [facility] "A" at 6:30  A.M.  Call this number if you have problems the morning of surgery:  [phone]   Remember:  Do not eat food or drink liquids after midnight on May 09, 2017  Take these medicines the morning of surgery with A SIP OF WATER: Aspirin, Clopidogrel (PLAVIX), Amiodarone (PACERONE) and Gabapentin (NEURONTIN). If needed NitroGLYCERIN (NITROSTAT) (please inform your nurse if you had to take a dose) and  Albuterol inhaler (bring the day of surgery).  The doctor has instructed you to continue your daily doses of Aspirin and Plavix, as well.  Stop taking all Vitamins, Fish oils, and Herbal medications. Also all NSAIDS i.e. Advil, Motrin, Aleve, Anaprox, Naproxen, BC and Goody Powders.  How to Manage Your Diabetes Before and After Surgery  Why is it important to control my blood sugar before and after surgery? . Improving blood sugar levels before and after surgery helps healing and can limit problems. . A way of improving blood sugar control is eating a healthy diet by: o  Eating less sugar and carbohydrates o  Increasing activity/exercise o  Talking with your doctor about reaching your blood sugar goals . High blood sugars (greater than 180 mg/dL) can raise your risk of infections and slow your recovery, so you will need to focus on controlling your diabetes during the weeks before surgery. . Make sure that the doctor who takes care of your diabetes knows about your planned surgery including the date and location.  How do I manage my blood sugar before surgery? . Check your blood sugar at least 4 times a day, starting 2 days before surgery, to make sure that  the level is not too high or low. o Check your blood sugar the morning of your surgery when you wake up and every 2 hours until you get to the Short Stay unit. . If your blood sugar is less than 70 mg/dL, you will need to treat for low blood sugar: o Do not take insulin. o Treat a low blood sugar (less than 70 mg/dL) with  cup of clear juice (cranberry or apple), 4 glucose tablets, OR glucose gel. o Recheck blood sugar in 15 minutes after treatment (to make sure it is greater than 70 mg/dL). If your blood sugar is not greater than 70 mg/dL on recheck, call [phone] for further instructions. . Report your blood sugar to the short stay nurse when you get to Short Stay.  . If you are admitted to the hospital after surgery: o Your blood sugar will be checked by the staff and you will probably be given insulin after surgery (instead of oral diabetes medicines) to make sure you have good blood sugar levels. o The goal for blood sugar control after surgery is 80-180 mg/dL.  WHAT DO I DO ABOUT MY DIABETES MEDICATION?  [name]. Do not take MetFORMIN (GLUCOPHAGE) the morning of surgery.   Do not wear jewelry, make-up or nail polish.  Do not wear lotions, powders, or perfumes, or deoderant.  Do not shave 48 hours prior to surgery.  Men may shave face and neck.  Do not bring valuables to the hospital.  [facility] is not responsible for any belongings or valuables.  Contacts, dentures or bridgework may not be worn into surgery.  Leave your suitcase in the car.  After surgery it may be brought to your room.  For patients admitted to the hospital, discharge time will be determined by your treatment team.  Patients discharged the day of surgery will not be allowed to drive home.   Special instructions:   [name]- Preparing For Surgery  Before surgery, you can play an important role. Because skin is not sterile, your skin needs to be as free of germs as possible. You can reduce the number of germs  on your skin by washing with CHG (chlorahexidine gluconate) Soap before surgery.  CHG is an antiseptic cleaner which kills germs and bonds with the skin to continue killing germs even after washing.  Please do not use if you have an allergy to CHG or antibacterial soaps. If your skin becomes reddened/irritated stop using the CHG.  Do not shave (including legs and underarms) for at least 48 hours prior to first CHG shower. It is OK to shave your face.  Please follow these instructions carefully.   1. Shower the NIGHT BEFORE SURGERY and the MORNING OF SURGERY with CHG.   2. If you chose to wash your hair, wash your hair first as usual with your normal shampoo.  3. After you shampoo, rinse your hair and body thoroughly to remove the shampoo.  4. Use CHG as you would any other liquid soap. You can apply CHG directly to the skin and wash gently with a scrungie or a clean washcloth.   5. Apply the CHG Soap to your body ONLY FROM THE NECK DOWN.  Do not use on open wounds or open sores. Avoid contact with your eyes, ears, mouth and genitals (private parts). Wash genitals (private parts) with your normal soap.  6. Wash thoroughly, paying special attention to the area where your surgery will be performed.  7. Thoroughly rinse your body with warm water from the neck down.  8. DO NOT shower/wash with your normal soap after using and rinsing off the CHG Soap.  9. Pat yourself dry with a CLEAN TOWEL.   10. Wear CLEAN PAJAMAS   11. Place CLEAN SHEETS on your bed the night of your first shower and DO NOT SLEEP WITH PETS.  Day of Surgery: Do not apply any deodorants/lotions. Please wear clean clothes to the hospital/surgery center.    Please read over the following fact sheets that you were given. Pain Booklet, Coughing and Deep Breathing and Surgical Site Infection Prevention

## 2017-05-04 NOTE — Telephone Encounter (Signed)
Called pt to let him know that Dr. Sharyn LullHarwani approved him stopping his Eliquis and starting Plavix. The patient stopped Eliquis on 05/03/17 and started Plavix 75mg  1 daily on 05/04/17. He states understanding of this plan of care. JM

## 2017-05-04 NOTE — Progress Notes (Signed)
Spoke with Tresa EndoKelly, [place] of Dr. Corliss Skainseveshwar, she was informed of no orders being placed for the procedure scheduled for 05/10/17. Tresa EndoKelly also sts the pt should continue Aspirin and Plavix, and take the morning of the procedure prior to arrival.

## 2017-05-05 ENCOUNTER — Encounter (HOSPITAL_COMMUNITY)
Admission: RE | Admit: 2017-05-05 | Discharge: 2017-05-05 | Disposition: A | Discharge status: Home / Self Care | Payer: Medicare Other | Source: Ambulatory Visit | Attending: Interventional Radiology | Admitting: Interventional Radiology

## 2017-05-05 ENCOUNTER — Other Ambulatory Visit: Payer: Self-pay | Admitting: General Surgery

## 2017-05-05 ENCOUNTER — Encounter (HOSPITAL_COMMUNITY): Payer: Self-pay

## 2017-05-05 DIAGNOSIS — Z7901 Long term (current) use of anticoagulants: Secondary | ICD-10-CM | POA: Diagnosis not present

## 2017-05-05 DIAGNOSIS — N189 Chronic kidney disease, unspecified: Secondary | ICD-10-CM | POA: Insufficient documentation

## 2017-05-05 DIAGNOSIS — Z01818 Encounter for other preprocedural examination: Secondary | ICD-10-CM | POA: Diagnosis not present

## 2017-05-05 DIAGNOSIS — E1122 Type 2 diabetes mellitus with diabetic chronic kidney disease: Secondary | ICD-10-CM | POA: Diagnosis not present

## 2017-05-05 DIAGNOSIS — Z01812 Encounter for preprocedural laboratory examination: Secondary | ICD-10-CM | POA: Insufficient documentation

## 2017-05-05 DIAGNOSIS — Z8673 Personal history of transient ischemic attack (TIA), and cerebral infarction without residual deficits: Secondary | ICD-10-CM | POA: Diagnosis not present

## 2017-05-05 DIAGNOSIS — I480 Paroxysmal atrial fibrillation: Secondary | ICD-10-CM | POA: Diagnosis not present

## 2017-05-05 DIAGNOSIS — I671 Cerebral aneurysm, nonruptured: Secondary | ICD-10-CM | POA: Diagnosis not present

## 2017-05-05 DIAGNOSIS — F319 Bipolar disorder, unspecified: Secondary | ICD-10-CM | POA: Insufficient documentation

## 2017-05-05 DIAGNOSIS — I129 Hypertensive chronic kidney disease with stage 1 through stage 4 chronic kidney disease, or unspecified chronic kidney disease: Secondary | ICD-10-CM | POA: Insufficient documentation

## 2017-05-05 DIAGNOSIS — J449 Chronic obstructive pulmonary disease, unspecified: Secondary | ICD-10-CM | POA: Insufficient documentation

## 2017-05-05 DIAGNOSIS — G8929 Other chronic pain: Secondary | ICD-10-CM | POA: Insufficient documentation

## 2017-05-05 DIAGNOSIS — M549 Dorsalgia, unspecified: Secondary | ICD-10-CM | POA: Insufficient documentation

## 2017-05-05 DIAGNOSIS — E785 Hyperlipidemia, unspecified: Secondary | ICD-10-CM | POA: Insufficient documentation

## 2017-05-05 DIAGNOSIS — K219 Gastro-esophageal reflux disease without esophagitis: Secondary | ICD-10-CM | POA: Insufficient documentation

## 2017-05-05 DIAGNOSIS — Z87891 Personal history of nicotine dependence: Secondary | ICD-10-CM | POA: Insufficient documentation

## 2017-05-05 HISTORY — DX: Presence of dental prosthetic device (complete) (partial): Z97.2

## 2017-05-05 HISTORY — DX: Presence of spectacles and contact lenses: Z97.3

## 2017-05-05 HISTORY — DX: Cerebral aneurysm, nonruptured: I67.1

## 2017-05-05 LAB — CBC WITH DIFFERENTIAL/PLATELET
Basophils Absolute: 0 10*3/uL (ref 0.0–0.1)
Basophils Relative: 0 %
Eosinophils Absolute: 0.2 10*3/uL (ref 0.0–0.7)
Eosinophils Relative: 3 %
HEMATOCRIT: 39.9 % (ref 39.0–52.0)
HEMOGLOBIN: 13.5 g/dL (ref 13.0–17.0)
LYMPHS ABS: 2.1 10*3/uL (ref 0.7–4.0)
LYMPHS PCT: 32 %
MCH: 29.5 pg (ref 26.0–34.0)
MCHC: 33.8 g/dL (ref 30.0–36.0)
MCV: 87.1 fL (ref 78.0–100.0)
MONOS PCT: 6 %
Monocytes Absolute: 0.4 10*3/uL (ref 0.1–1.0)
NEUTROS ABS: 3.8 10*3/uL (ref 1.7–7.7)
NEUTROS PCT: 59 %
Platelets: 222 10*3/uL (ref 150–400)
RBC: 4.58 MIL/uL (ref 4.22–5.81)
RDW: 12.4 % (ref 11.5–15.5)
WBC: 6.5 10*3/uL (ref 4.0–10.5)

## 2017-05-05 LAB — BASIC METABOLIC PANEL
Anion gap: 8 (ref 5–15)
BUN: 11 mg/dL (ref 6–20)
CHLORIDE: 102 mmol/L (ref 101–111)
CO2: 28 mmol/L (ref 22–32)
CREATININE: 1.04 mg/dL (ref 0.61–1.24)
Calcium: 9.3 mg/dL (ref 8.9–10.3)
GFR calc non Af Amer: >60 mL/min (ref >60)
Glucose, Bld: 103 mg/dL — ABNORMAL HIGH (ref 65–99)
POTASSIUM: 3.6 mmol/L (ref 3.5–5.1)
Sodium: 138 mmol/L (ref 135–145)

## 2017-05-05 LAB — PLATELET INHIBITION P2Y12: Platelet Function  P2Y12: 193 [PRU] — ABNORMAL LOW (ref 194–418)

## 2017-05-05 LAB — APTT: aPTT: 28 seconds (ref 24–36)

## 2017-05-05 LAB — GLUCOSE, CAPILLARY: GLUCOSE-CAPILLARY: 106 mg/dL — AB (ref 65–99)

## 2017-05-05 LAB — PROTIME-INR
INR: 0.93
Prothrombin Time: 12.4 seconds (ref 11.4–15.2)

## 2017-05-05 NOTE — Pre-Procedure Instructions (Addendum)
Chandra Dewaine OatsL Jaquith  05/05/2017      [facility] [phone] [place] - [address] [facility] [address] [place] [address] Phone: [phone] Fax: [phone]    Your procedure is scheduled on  Monday, May 10, 2017  [facility] "A" at 6:00  A.M.  Call this number if you have problems the morning of surgery:  [phone]   Remember:  Do not eat food or drink liquids after midnight on May 09, 2017  Take these medicines the morning of surgery with A SIP OF WATER: Ellipta inhaler Aspirin, Clopidogrel (PLAVIX), Amiodarone (PACERONE) and Gabapentin (NEURONTIN). If needed NitroGLYCERIN (NITROSTAT) (please inform your nurse if you had to take a dose) and  Albuterol inhaler (bring the day of surgery).  The doctor has instructed you to continue your daily doses of Aspirin and Plavix, as well.  Stop taking all Vitamins, Fish oils, and Herbal medications. Also all NSAIDS i.e. Advil, Motrin, Aleve, Anaprox, Naproxen, BC and Goody Powders.  How to Manage Your Diabetes Before and After Surgery  Why is it important to control my blood sugar before and after surgery? . Improving blood sugar levels before and after surgery helps healing and can limit problems. . A way of improving blood sugar control is eating a healthy diet by: o  Eating less sugar and carbohydrates o  Increasing activity/exercise o  Talking with your doctor about reaching your blood sugar goals . High blood sugars (greater than 180 mg/dL) can raise your risk of infections and slow your recovery, so you will need to focus on controlling your diabetes during the weeks before surgery. . Make sure that the doctor who takes care of your diabetes knows about your planned surgery including the date and location.  How do I manage my blood sugar before surgery? . Check your blood sugar at least 4 times a day, starting 2 days before  surgery, to make sure that the level is not too high or low. o Check your blood sugar the morning of your surgery when you wake up and every 2 hours until you get to the Short Stay unit. . If your blood sugar is less than 70 mg/dL, you will need to treat for low blood sugar: o Do not take insulin. o Treat a low blood sugar (less than 70 mg/dL) with  cup of clear juice (cranberry or apple), 4 glucose tablets, OR glucose gel. o Recheck blood sugar in 15 minutes after treatment (to make sure it is greater than 70 mg/dL). If your blood sugar is not greater than 70 mg/dL on recheck, call [phone] for further instructions. . Report your blood sugar to the short stay nurse when you get to Short Stay.  . If you are admitted to the hospital after surgery: o Your blood sugar will be checked by the staff and you will probably be given insulin after surgery (instead of oral diabetes medicines) to make sure you have good blood sugar levels. o The goal for blood sugar control after surgery is 80-180 mg/dL.  WHAT DO I DO ABOUT MY DIABETES MEDICATION?  [name]. Do not take MetFORMIN (GLUCOPHAGE) the morning of surgery.   Do not wear jewelry, make-up or nail polish.  Do not wear lotions, powders, or perfumes, or deoderant.  Do not shave 48 hours prior to surgery.  Men may shave face and neck.  Do not bring valuables  to the hospital.  [facility] for any belongings or valuables.  Contacts, dentures or bridgework may not be worn into surgery.  Leave your suitcase in the car.  After surgery it may be brought to your room.  For patients admitted to the hospital, discharge time will be determined by your treatment team.  Patients discharged the day of surgery will not be allowed to drive home.   Special instructions:   [facility]- Preparing For Surgery  Before surgery, you can play an important role. Because skin is not sterile, your skin needs to be as free of germs as possible. You can  reduce the number of germs on your skin by washing with CHG (chlorahexidine gluconate) Soap before surgery.  CHG is an antiseptic cleaner which kills germs and bonds with the skin to continue killing germs even after washing.  Please do not use if you have an allergy to CHG or antibacterial soaps. If your skin becomes reddened/irritated stop using the CHG.  Do not shave (including legs and underarms) for at least 48 hours prior to first CHG shower. It is OK to shave your face.  Please follow these instructions carefully.   1. Shower the NIGHT BEFORE SURGERY and the MORNING OF SURGERY with CHG.   2. If you chose to wash your hair, wash your hair first as usual with your normal shampoo.  3. After you shampoo, rinse your hair and body thoroughly to remove the shampoo.  4. Use CHG as you would any other liquid soap. You can apply CHG directly to the skin and wash gently with a scrungie or a clean washcloth.   5. Apply the CHG Soap to your body ONLY FROM THE NECK DOWN.  Do not use on open wounds or open sores. Avoid contact with your eyes, ears, mouth and genitals (private parts). Wash genitals (private parts) with your normal soap.  6. Wash thoroughly, paying special attention to the area where your surgery will be performed.  7. Thoroughly rinse your body with warm water from the neck down.  8. DO NOT shower/wash with your normal soap after using and rinsing off the CHG Soap.  9. Pat yourself dry with a CLEAN TOWEL.   10. Wear CLEAN PAJAMAS   11. Place CLEAN SHEETS on your bed the night of your first shower and DO NOT SLEEP WITH PETS.  Day of Surgery: Do not apply any deodorants/lotions. Please wear clean clothes to the hospital/surgery center.    Please read over the following fact sheets that you were given. Pain Booklet, Coughing and Deep Breathing and Surgical Site Infection Prevention

## 2017-05-05 NOTE — Progress Notes (Signed)
Anesthesia Note: Patient is a 56 year old male scheduled for embolization of aneurysm on 05/10/17 by Dr. Corliss Skainseveshwar. Patient with known multiple cerebral aneurysms. He has had right sided temporal headaches with known right MCA aneurysm, and endovascular treatment recommended. According to notes in Epic, Dr. Corliss Skainseveshwar or his staff did notify patient's cardiologist Dr. Sharyn LullHarwani of procedure plans and were given permission to stop patient's Eliquis (on for afib/PAF) and start Plavix (started 05/04/17). He is to continue Plavix and ASA for this procedure.  History includes former smoker (quit '15), post-operative N/V, afib (s/p DCCV 12/18/16 and 01/12/17), hiatal hernia, GERD, DM2, HTN, HLD, CVA 06/2014, CKD, COPD, asthma, Bipolar disorder, depression, chronic back pain. Has glasses and dentures. He has had multiple cerebral angiograms s/p left MCA aneurysm stent assisted coiling 08/13/14 and right ICA angioplasty 09/25/15. OSA screening score was 7.  PCP is listed as Dr. Tracey Harriesavid Bouska. Cardiologist is Dr. Rinaldo CloudMohan Harwani. Last visit 03/30/17.   Cerebral angiogram 04/22/17: Findings:indings. 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 The right carotid bifurcation stent is patent. 2. Slight decreased size of the cavernous internal carotid artery on the left may reflect some flow limitation on the right. 3. 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 4. Left M1 segment and MCA bifurcation stent and aneurysm coils without evidence for residual or recurrent aneurysm or distal occlusion. 5. Stable mm right carotid bifurcation aneurysm. 6. 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  EKG 02/05/17 (Dr. Sharyn LullHarwani): SB at 53 bpm, LAD, incomplete right BBB, voltage criteria for LVH. Baseline wanderer in  multiple leads, worse in I, II.   TEE (done with DCCV) 12/18/16: Study Conclusions - Left ventricle: Systolic function was normal. Wall motion was   normal; there were no regional wall motion abnormalities. - Mitral valve: There was mild regurgitation directed centrally. - Left atrium: No evidence of thrombus in the atrial cavity or   appendage. - Right atrium: No evidence of thrombus in the atrial cavity or   appendage. - Atrial septum: There was a patent foramen ovale by sonicated   saline injection and pressure on abdomen. IMPRESSION:   1. Normal LV systolic function. 2. PFO by saline injection. 3. No clot in LA or appendage RECOMMENDATIONS:    Patient underwent 120 J biphasic shock x one with successful cardioversion. IV amiodarone resumed.  Nuclear stress test 04/03/16: IMPRESSION: 1. Possible inferior wall scar.  No evidence of ischemia. 2. Mild hypokinesis, most marked in the inferior wall. 3. Left ventricular ejection fraction 46% (previously 43% on 02/03/2010). 4. Intermediate-risk stress test findings*.  CXR 01/13/15: IMPRESSION:  No active cardiopulmonary disease.  Preoperative labs noted. P2y12 193 (just started Plavix 05/04/17). He will get a STAT p2y12 on arrival the day of his procedure.     If no acute changes and p2y12 results within acceptable range then I would anticipate that he could proceed as planned.   Velna Ochsllison Lititia Sen, PA-C [facility]iology Phone [phone] 05/05/2017 5:33 PM

## 2017-05-05 NOTE — Progress Notes (Signed)
Pt denies SOB and chest pain. Pt sated that he was under the care of Dr. Sharyn LullHarwani, Cardiology. Pt denies having a cardiac cath but stated that an EKG was performed at Dr. Annitta JerseyHarwani's office after recent ED visit (01/12/17); records requested. Anesthesia made aware of order for Consult (see note).

## 2017-05-05 NOTE — Progress Notes (Signed)
   05/05/17 1256  OBSTRUCTIVE SLEEP APNEA  Have you ever been diagnosed with sleep apnea through a sleep study? No  Do you snore loudly (loud enough to be heard through closed doors)?  1  Do you often feel tired, fatigued, or sleepy during the daytime (such as falling asleep during driving or talking to someone)? 1  Has anyone observed you stop breathing during your sleep? 0  Do you have, or are you being treated for high blood pressure? 1  BMI more than 35 kg/m2? 1  Age > 50 (1-yes) 1  Neck circumference greater than:Male 16 inches or larger, Male 17inches or larger? 1  Male Gender (Yes=1) 1  Obstructive Sleep Apnea Score 7

## 2017-05-06 ENCOUNTER — Inpatient Hospital Stay (HOSPITAL_COMMUNITY): Admission: RE | Admit: 2017-05-06 | Payer: Medicare Other | Source: Ambulatory Visit

## 2017-05-07 ENCOUNTER — Other Ambulatory Visit: Payer: Self-pay | Admitting: Radiology

## 2017-05-10 ENCOUNTER — Encounter (HOSPITAL_COMMUNITY)
Admission: AD | Disposition: A | Discharge status: Home / Self Care | Payer: Self-pay | Source: Ambulatory Visit | Attending: Interventional Radiology

## 2017-05-10 ENCOUNTER — Ambulatory Visit (HOSPITAL_COMMUNITY): Payer: Medicare Other | Admitting: Vascular Surgery

## 2017-05-10 ENCOUNTER — Ambulatory Visit (HOSPITAL_COMMUNITY): Payer: Medicare Other | Admitting: Certified Registered"

## 2017-05-10 ENCOUNTER — Ambulatory Visit (HOSPITAL_COMMUNITY)
Admission: RE | Admit: 2017-05-10 | Discharge: 2017-05-10 | Disposition: A | Discharge status: Home / Self Care | Payer: Medicare Other | Source: Ambulatory Visit | Attending: Interventional Radiology | Admitting: Interventional Radiology

## 2017-05-10 ENCOUNTER — Observation Stay (HOSPITAL_COMMUNITY)
Admission: AD | Admit: 2017-05-10 | Discharge: 2017-05-11 | Disposition: A | Discharge status: Home / Self Care | Payer: Medicare Other | Source: Ambulatory Visit | Attending: Interventional Radiology | Admitting: Interventional Radiology

## 2017-05-10 ENCOUNTER — Encounter (HOSPITAL_COMMUNITY): Payer: Self-pay | Admitting: Certified Registered"

## 2017-05-10 DIAGNOSIS — I129 Hypertensive chronic kidney disease with stage 1 through stage 4 chronic kidney disease, or unspecified chronic kidney disease: Secondary | ICD-10-CM | POA: Insufficient documentation

## 2017-05-10 DIAGNOSIS — K219 Gastro-esophageal reflux disease without esophagitis: Secondary | ICD-10-CM | POA: Diagnosis not present

## 2017-05-10 DIAGNOSIS — M1990 Unspecified osteoarthritis, unspecified site: Secondary | ICD-10-CM | POA: Diagnosis not present

## 2017-05-10 DIAGNOSIS — Z7984 Long term (current) use of oral hypoglycemic drugs: Secondary | ICD-10-CM | POA: Insufficient documentation

## 2017-05-10 DIAGNOSIS — M545 Low back pain: Secondary | ICD-10-CM | POA: Diagnosis not present

## 2017-05-10 DIAGNOSIS — E785 Hyperlipidemia, unspecified: Secondary | ICD-10-CM

## 2017-05-10 DIAGNOSIS — Z8249 Family history of ischemic heart disease and other diseases of the circulatory system: Secondary | ICD-10-CM | POA: Diagnosis not present

## 2017-05-10 DIAGNOSIS — E1122 Type 2 diabetes mellitus with diabetic chronic kidney disease: Secondary | ICD-10-CM | POA: Insufficient documentation

## 2017-05-10 DIAGNOSIS — Z7902 Long term (current) use of antithrombotics/antiplatelets: Secondary | ICD-10-CM | POA: Insufficient documentation

## 2017-05-10 DIAGNOSIS — Z87891 Personal history of nicotine dependence: Secondary | ICD-10-CM | POA: Insufficient documentation

## 2017-05-10 DIAGNOSIS — Z823 Family history of stroke: Secondary | ICD-10-CM

## 2017-05-10 DIAGNOSIS — I4891 Unspecified atrial fibrillation: Secondary | ICD-10-CM | POA: Insufficient documentation

## 2017-05-10 DIAGNOSIS — I671 Cerebral aneurysm, nonruptured: Secondary | ICD-10-CM | POA: Diagnosis not present

## 2017-05-10 DIAGNOSIS — Z8673 Personal history of transient ischemic attack (TIA), and cerebral infarction without residual deficits: Secondary | ICD-10-CM | POA: Diagnosis not present

## 2017-05-10 DIAGNOSIS — Z7982 Long term (current) use of aspirin: Secondary | ICD-10-CM

## 2017-05-10 DIAGNOSIS — J449 Chronic obstructive pulmonary disease, unspecified: Secondary | ICD-10-CM | POA: Insufficient documentation

## 2017-05-10 DIAGNOSIS — N189 Chronic kidney disease, unspecified: Secondary | ICD-10-CM | POA: Insufficient documentation

## 2017-05-10 DIAGNOSIS — Z79899 Other long term (current) drug therapy: Secondary | ICD-10-CM | POA: Insufficient documentation

## 2017-05-10 DIAGNOSIS — Z6838 Body mass index (BMI) 38.0-38.9, adult: Secondary | ICD-10-CM | POA: Diagnosis not present

## 2017-05-10 DIAGNOSIS — I729 Aneurysm of unspecified site: Secondary | ICD-10-CM | POA: Diagnosis present

## 2017-05-10 DIAGNOSIS — Z7901 Long term (current) use of anticoagulants: Secondary | ICD-10-CM | POA: Diagnosis not present

## 2017-05-10 DIAGNOSIS — E1151 Type 2 diabetes mellitus with diabetic peripheral angiopathy without gangrene: Secondary | ICD-10-CM | POA: Insufficient documentation

## 2017-05-10 DIAGNOSIS — G8929 Other chronic pain: Secondary | ICD-10-CM | POA: Diagnosis not present

## 2017-05-10 HISTORY — PX: RADIOLOGY WITH ANESTHESIA: SHX6223

## 2017-05-10 HISTORY — PX: IR 3D INDEPENDENT WKST: IMG2385

## 2017-05-10 HISTORY — PX: IR NEURO EACH ADD'L AFTER BASIC UNI RIGHT (MS): IMG5374

## 2017-05-10 HISTORY — PX: IR TRANSCATH/EMBOLIZ: IMG695

## 2017-05-10 HISTORY — PX: IR ANGIOGRAM FOLLOW UP STUDY: IMG697

## 2017-05-10 HISTORY — PX: IR ANGIO INTRA EXTRACRAN SEL INTERNAL CAROTID UNI R MOD SED: IMG5362

## 2017-05-10 LAB — GLUCOSE, CAPILLARY
GLUCOSE-CAPILLARY: 118 mg/dL — AB (ref 65–99)
Glucose-Capillary: 106 mg/dL — ABNORMAL HIGH (ref 65–99)
Glucose-Capillary: 114 mg/dL — ABNORMAL HIGH (ref 65–99)
Glucose-Capillary: 131 mg/dL — ABNORMAL HIGH (ref 65–99)
Glucose-Capillary: 160 mg/dL — ABNORMAL HIGH (ref 65–99)

## 2017-05-10 LAB — HEPARIN LEVEL (UNFRACTIONATED): HEPARIN UNFRACTIONATED: 0.1 [IU]/mL — AB (ref 0.30–0.70)

## 2017-05-10 LAB — POCT ACTIVATED CLOTTING TIME
ACTIVATED CLOTTING TIME: 158 s
ACTIVATED CLOTTING TIME: 191 s
Activated Clotting Time: 213 seconds

## 2017-05-10 LAB — MRSA PCR SCREENING: MRSA by PCR: NEGATIVE

## 2017-05-10 LAB — PLATELET INHIBITION P2Y12: PLATELET FUNCTION P2Y12: 91 [PRU] — AB (ref 194–418)

## 2017-05-10 SURGERY — RADIOLOGY WITH ANESTHESIA
Anesthesia: General

## 2017-05-10 MED ORDER — IOPAMIDOL (ISOVUE-300) INJECTION 61%
INTRAVENOUS | Status: AC
  Filled 2017-05-10: qty 50

## 2017-05-10 MED ORDER — GABAPENTIN 100 MG PO CAPS
100.0000 mg | ORAL_CAPSULE | Freq: Three times a day (TID) | ORAL | Status: DC
  Administered 2017-05-10 – 2017-05-11 (×3): 100 mg via ORAL
  Filled 2017-05-10 (×3): qty 1

## 2017-05-10 MED ORDER — CLOPIDOGREL BISULFATE 75 MG PO TABS
75.0000 mg | ORAL_TABLET | Freq: Every day | ORAL | Status: DC
  Administered 2017-05-11: 75 mg via ORAL
  Filled 2017-05-10: qty 1

## 2017-05-10 MED ORDER — PHENYLEPHRINE HCL 10 MG/ML IJ SOLN
INTRAVENOUS | Status: DC | PRN
  Administered 2017-05-10: 20 ug/min via INTRAVENOUS

## 2017-05-10 MED ORDER — FENTANYL CITRATE (PF) 100 MCG/2ML IJ SOLN: 25.0000 ug | INTRAMUSCULAR | Status: DC | PRN

## 2017-05-10 MED ORDER — CLOPIDOGREL BISULFATE 75 MG PO TABS
75.0000 mg | ORAL_TABLET | ORAL | Status: DC
  Filled 2017-05-10 (×2): qty 1

## 2017-05-10 MED ORDER — HEPARIN (PORCINE) IN NACL 100-0.45 UNIT/ML-% IJ SOLN: 750.0000 [IU]/h | INTRAMUSCULAR | Status: AC

## 2017-05-10 MED ORDER — CEFAZOLIN SODIUM-DEXTROSE 2-4 GM/100ML-% IV SOLN
2.0000 g | INTRAVENOUS | Status: AC
  Administered 2017-05-10: 2 g via INTRAVENOUS
  Filled 2017-05-10 (×2): qty 100

## 2017-05-10 MED ORDER — ACETAMINOPHEN 160 MG/5ML PO SOLN: 650.0000 mg | ORAL | Status: DC | PRN

## 2017-05-10 MED ORDER — ASPIRIN 325 MG PO TABS
325.0000 mg | ORAL_TABLET | Freq: Every day | ORAL | Status: DC
  Administered 2017-05-11: 325 mg via ORAL
  Filled 2017-05-10: qty 1

## 2017-05-10 MED ORDER — IOPAMIDOL (ISOVUE-300) INJECTION 61%
INTRAVENOUS | Status: AC
  Filled 2017-05-10: qty 150

## 2017-05-10 MED ORDER — NITROGLYCERIN 1 MG/10 ML FOR IR/CATH LAB
INTRA_ARTERIAL | Status: AC
  Filled 2017-05-10: qty 10

## 2017-05-10 MED ORDER — ACETAMINOPHEN 325 MG PO TABS
650.0000 mg | ORAL_TABLET | ORAL | Status: DC | PRN
  Administered 2017-05-10: 650 mg via ORAL
  Filled 2017-05-10: qty 2

## 2017-05-10 MED ORDER — HEPARIN (PORCINE) IN NACL 100-0.45 UNIT/ML-% IJ SOLN
500.0000 [IU]/h | INTRAMUSCULAR | Status: DC
  Administered 2017-05-10: 500 [IU]/h via INTRAVENOUS
  Filled 2017-05-10: qty 250

## 2017-05-10 MED ORDER — SODIUM CHLORIDE 0.9 % IV SOLN
INTRAVENOUS | Status: DC
  Administered 2017-05-10 – 2017-05-11 (×2): via INTRAVENOUS

## 2017-05-10 MED ORDER — LIDOCAINE 2% (20 MG/ML) 5 ML SYRINGE
INTRAMUSCULAR | Status: DC | PRN
  Administered 2017-05-10: 60 mg via INTRAVENOUS

## 2017-05-10 MED ORDER — LIDOCAINE HCL (PF) 1 % IJ SOLN
INTRAMUSCULAR | Status: AC
  Filled 2017-05-10: qty 30

## 2017-05-10 MED ORDER — HYDROCHLOROTHIAZIDE 25 MG PO TABS
12.5000 mg | ORAL_TABLET | Freq: Every day | ORAL | Status: DC
  Administered 2017-05-11: 12.5 mg via ORAL
  Filled 2017-05-10: qty 1

## 2017-05-10 MED ORDER — EPTIFIBATIDE 20 MG/10ML IV SOLN
INTRAVENOUS | Status: AC
  Filled 2017-05-10: qty 10

## 2017-05-10 MED ORDER — MIDAZOLAM HCL 5 MG/5ML IJ SOLN
INTRAMUSCULAR | Status: DC | PRN
  Administered 2017-05-10: 2 mg via INTRAVENOUS

## 2017-05-10 MED ORDER — ONDANSETRON HCL 4 MG/2ML IJ SOLN: 4.0000 mg | Freq: Four times a day (QID) | INTRAMUSCULAR | Status: DC | PRN

## 2017-05-10 MED ORDER — ONDANSETRON HCL 4 MG/2ML IJ SOLN
INTRAMUSCULAR | Status: DC | PRN
  Administered 2017-05-10: 4 mg via INTRAVENOUS

## 2017-05-10 MED ORDER — NIMODIPINE 30 MG PO CAPS
0.0000 mg | ORAL_CAPSULE | ORAL | Status: DC
  Filled 2017-05-10: qty 2

## 2017-05-10 MED ORDER — UMECLIDINIUM-VILANTEROL 62.5-25 MCG/INH IN AEPB
1.0000 | INHALATION_SPRAY | Freq: Every day | RESPIRATORY_TRACT | Status: DC
Start: 2017-05-10 — End: 2017-05-11
  Administered 2017-05-11: 1 via RESPIRATORY_TRACT
  Filled 2017-05-10: qty 14

## 2017-05-10 MED ORDER — PROPOFOL 10 MG/ML IV BOLUS
INTRAVENOUS | Status: DC | PRN
  Administered 2017-05-10: 160 mg via INTRAVENOUS

## 2017-05-10 MED ORDER — DEXAMETHASONE SODIUM PHOSPHATE 10 MG/ML IJ SOLN
INTRAMUSCULAR | Status: DC | PRN
  Administered 2017-05-10: 4 mg via INTRAVENOUS

## 2017-05-10 MED ORDER — ASPIRIN EC 325 MG PO TBEC
325.0000 mg | DELAYED_RELEASE_TABLET | ORAL | Status: DC
  Filled 2017-05-10 (×2): qty 1

## 2017-05-10 MED ORDER — ACETAMINOPHEN 650 MG RE SUPP: 650.0000 mg | RECTAL | Status: DC | PRN

## 2017-05-10 MED ORDER — ASPIRIN 81 MG PO CHEW
CHEWABLE_TABLET | ORAL | Status: AC
  Administered 2017-05-10: 243 mg
  Filled 2017-05-10: qty 3

## 2017-05-10 MED ORDER — HEPARIN (PORCINE) IN NACL 100-0.45 UNIT/ML-% IJ SOLN
INTRAMUSCULAR | Status: AC
  Administered 2017-05-10: 500 [IU]/h via INTRAVENOUS
  Filled 2017-05-10: qty 250

## 2017-05-10 MED ORDER — ROCURONIUM BROMIDE 10 MG/ML (PF) SYRINGE
PREFILLED_SYRINGE | INTRAVENOUS | Status: DC | PRN
  Administered 2017-05-10: 50 mg via INTRAVENOUS

## 2017-05-10 MED ORDER — SODIUM CHLORIDE 0.9 % IV SOLN
INTRAVENOUS | Status: DC
  Administered 2017-05-10: 13:00:00 via INTRAVENOUS

## 2017-05-10 MED ORDER — LACTATED RINGERS IV SOLN
INTRAVENOUS | Status: DC
  Administered 2017-05-10: 12:00:00 via INTRAVENOUS
  Administered 2017-05-10: 50 mL/h via INTRAVENOUS

## 2017-05-10 MED ORDER — IOPAMIDOL (ISOVUE-300) INJECTION 61%
INTRAVENOUS | Status: AC
  Administered 2017-05-10: 20 mL
  Filled 2017-05-10: qty 100

## 2017-05-10 MED ORDER — FENTANYL CITRATE (PF) 100 MCG/2ML IJ SOLN
INTRAMUSCULAR | Status: DC | PRN
  Administered 2017-05-10: 50 ug via INTRAVENOUS
  Administered 2017-05-10: 100 ug via INTRAVENOUS

## 2017-05-10 MED ORDER — AMIODARONE HCL 200 MG PO TABS
200.0000 mg | ORAL_TABLET | Freq: Every day | ORAL | Status: DC
  Administered 2017-05-11: 200 mg via ORAL
  Filled 2017-05-10: qty 1

## 2017-05-10 MED ORDER — ALBUTEROL SULFATE (2.5 MG/3ML) 0.083% IN NEBU: 2.5000 mg | INHALATION_SOLUTION | Freq: Four times a day (QID) | RESPIRATORY_TRACT | Status: DC | PRN

## 2017-05-10 MED ORDER — IOPAMIDOL (ISOVUE-300) INJECTION 61%
INTRAVENOUS | Status: AC
  Administered 2017-05-10: 60 mL
  Filled 2017-05-10: qty 150

## 2017-05-10 MED ORDER — INSULIN ASPART 100 UNIT/ML ~~LOC~~ SOLN
0.0000 [IU] | Freq: Three times a day (TID) | SUBCUTANEOUS | Status: DC
  Administered 2017-05-10 – 2017-05-11 (×3): 2 [IU] via SUBCUTANEOUS

## 2017-05-10 MED ORDER — CLEVIDIPINE BUTYRATE 0.5 MG/ML IV EMUL: 0.0000 mg/h | INTRAVENOUS | Status: DC

## 2017-05-10 MED ORDER — HEPARIN SODIUM (PORCINE) 1000 UNIT/ML IJ SOLN
INTRAMUSCULAR | Status: DC | PRN
  Administered 2017-05-10: 1000 [IU] via INTRAVENOUS
  Administered 2017-05-10: 500 [IU] via INTRAVENOUS
  Administered 2017-05-10: 3000 [IU] via INTRAVENOUS

## 2017-05-10 MED ORDER — EPHEDRINE SULFATE-NACL 50-0.9 MG/10ML-% IV SOSY
PREFILLED_SYRINGE | INTRAVENOUS | Status: DC | PRN
  Administered 2017-05-10: 10 mg via INTRAVENOUS

## 2017-05-10 NOTE — Anesthesia Procedure Notes (Signed)
Procedure Name: Intubation Date/Time: 05/10/2017 9:19 AM Performed by: Melina Copa, Xzaiver Vayda R Pre-anesthesia Checklist: Patient identified, Emergency Drugs available, Suction available and Patient being monitored Patient Re-evaluated:Patient Re-evaluated prior to induction Oxygen Delivery Method: Circle System Utilized Preoxygenation: Pre-oxygenation with 100% oxygen Induction Type: IV induction Ventilation: Mask ventilation without difficulty Laryngoscope Size: Mac and 4 Grade View: Grade I Tube type: Oral Tube size: 8.0 mm Number of attempts: 1 Airway Equipment and Method: Stylet and Oral airway Placement Confirmation: ETT inserted through vocal cords under direct vision,  positive ETCO2 and breath sounds checked- equal and bilateral Secured at: 22 cm Tube secured with: Tape Dental Injury: Teeth and Oropharynx as per pre-operative assessment

## 2017-05-10 NOTE — Sedation Documentation (Signed)
6 Fr. Exoseal to right groin. 

## 2017-05-10 NOTE — H&P (Deleted)
  The note originally documented on this encounter has been moved the the encounter in which it belongs.  

## 2017-05-10 NOTE — Progress Notes (Signed)
ANTICOAGULATION CONSULT NOTE - Initial Consult  Pharmacy Consult for heparin Indication: cerebral angiogram  Allergies  Allergen Reactions  . Tylox [Oxycodone-Acetaminophen] Hives, Other (See Comments) and Rash    Sweating, shaking Reaction: Tremors and diaphoresis   . Adhesive [Tape] Rash    Patient Measurements:   Heparin Dosing Weight: 95kg  Vital Signs: Temp: 97.5 F (36.4 C) (07/30 1214) Temp Source: Oral (07/30 0655) BP: 114/61 (07/30 1259) Pulse Rate: 62 (07/30 1259)  Labs: No results for input(s): HGB, HCT, PLT, APTT, LABPROT, INR, HEPARINUNFRC, HEPRLOWMOCWT, CREATININE, CKTOTAL, CKMB, TROPONINI in the last 72 hours.  Estimated Creatinine Clearance: 100.1 mL/min (by C-G formula based on SCr of 1.04 mg/dL).   Medical History: Past Medical History:  Diagnosis Date  . A-fib (HCC)   . Aneurysm (HCC)    brain  . Arthritis   . Atrial fibrillation (HCC)   . Bipolar disorder (HCC)    was on meds but was taken off 2 yrs ago and none since  . Burning pain    in both legs-seeing Dr.Sethi for this  . Cataracts, bilateral   . Childhood asthma    Last asthma attack at age 89; History of trach at 16 months (12/17/2016)  . Chronic kidney disease 2009  . Chronic lower back pain   . COPD (chronic obstructive pulmonary disease) (HCC) 06/2012   uses Spiriva and Albuterol daily as needed  . Depression   . Family history of adverse reaction to anesthesia    "Mom and sister have PONV"  . GERD (gastroesophageal reflux disease)    was on Nexium;taking Omeprazole daily as needed  . H/O hiatal hernia   . Headache(784.0)    "q 2-3 weeks; daily last 2 wks" (12/17/2016)  . Hyperlipidemia    takes Ramipril daily  . Hypertension    takes Ramipril daily  . Joint pain   . Joint swelling   . Middle cerebral artery aneurysm    right  . Nocturia   . Numbness    both arms   . Pneumonia    hx of-2014  . PONV (postoperative nausea and vomiting)    Pt reports nausea only.  . Sleep  apnea    hx (12/17/2016);denies  . Stroke [facility](HCC) 2015   "drag left foot more since; have to wear glasses now" (12/17/2016)  . Type II diabetes mellitus (HCC)    takes Metformin daily  . Umbilical hernia   . Urinary frequency   . Wears dentures   . Wears glasses    and contact lenses    Medications:  Infusions:  . sodium chloride 75 mL/hr at 05/10/17 1237  . sodium chloride    . heparin    . lactated ringers 50 mL/hr (05/10/17 0744)    Assessment: 56 yom presented with an aneurysm requiring cerebral angiogram. Pre-op CBC is WNL and pt was on an anticoagulant PTA which has been on hold.   Goal of Therapy:  Heparin level 0.1-0.25 units/ml Monitor platelets by anticoagulation protocol: Yes   Plan:  Increase heparin gtt to 750 units/hr Check an 8 hr heparin level *Heparin off at 0700 tomorrow  [name], [name] 05/10/2017,1:18 PM

## 2017-05-10 NOTE — H&P (Signed)
Chief Complaint: Patient was seen in consultation today for aneurysm  Supervising Physician: [name]  Patient Status: [facility] - Out-pt  History of Present Illness: [name] is a 56 y.o. 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  Since this time he has continued with intermittent headaches.   He underwent CT Angio Head/Neck 03/29/17 which showed: 1. The right carotid bifurcation stent is patent. 2. Slight decreased size of the cavernous internal carotid artery on the left may reflect some flow limitation on the right. 3. 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 4. Left M1 segment and MCA bifurcation stent and aneurysm coils without evidence for residual or recurrent aneurysm or distal occlusion. 5. Stable mm right carotid bifurcation aneurysm. 6. 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  He underwent cerebral angiogram 04/22/17 which showed: 1.Obliterated previous Lt MCA aneurysm with patent adjacent stent. 2.Approx 4.36mm x 4.4 mm RT MCA bifurcation aneurysm 3.Approx 2.102mm x 1.19mm prox Lt ACA A 1 seg aneurysm  He presents today for cerebral angiogram with possible intervention.  Patient is aware degree of intervention will be based on findings.   He has been NPO.  He has held his Elliquis for 1 week.   Past Medical History:  Diagnosis Date  . A-fib (HCC)   . Aneurysm (HCC)    brain  . Arthritis   . Atrial fibrillation (HCC)   . Bipolar disorder (HCC)    was on meds but was taken off 2 yrs ago and none since  . Burning pain    in both legs-seeing Dr.Sethi for this  . Cataracts, bilateral   . Childhood asthma    Last asthma attack at age 56; History of trach at 16  months (12/17/2016)  . Chronic kidney disease 2009  . Chronic lower back pain   . COPD (chronic obstructive pulmonary disease) (HCC) 06/2012   uses Spiriva and Albuterol daily as needed  . Depression   . Family history of adverse reaction to anesthesia    "Mom and sister have PONV"  . GERD (gastroesophageal reflux disease)    was on Nexium;taking Omeprazole daily as needed  . H/O hiatal hernia   . Headache(784.0)    "q 2-3 weeks; daily last 2 wks" (12/17/2016)  . Hyperlipidemia    takes Ramipril daily  . Hypertension    takes Ramipril daily  . Joint pain   . Joint swelling   . Middle cerebral artery aneurysm    right  . Nocturia   . Numbness    both arms   . Pneumonia    hx of-2014  . PONV (postoperative nausea and vomiting)    Pt reports nausea only.  . Sleep apnea    hx (12/17/2016);denies  . Stroke [facility]) 2015   "drag left foot more since; have to wear glasses now" (12/17/2016)  . Type II diabetes mellitus (HCC)    takes Metformin daily  . Umbilical hernia   . Urinary frequency   . Wears dentures   . Wears glasses    and contact lenses    Past Surgical History:  Procedure Laterality Date  . ANEURYSM COILING  2015  . CARDIOVERSION N/A 12/18/2016   Procedure: CARDIOVERSION;  Surgeon: Orpah Cobb, MD;  Location: [facility] ENDOSCOPY;  Service: Cardiovascular;  Laterality: N/A;  . COLONOSCOPY    . EXCISIONAL HEMORRHOIDECTOMY  1980s   "soon after rectal OR"  . GANGLION CYST EXCISION Right   . IR ANGIO INTRA EXTRACRAN SEL COM CAROTID INNOMINATE BILAT MOD SED  04/22/2017  . IR ANGIO VERTEBRAL SEL SUBCLAVIAN INNOMINATE UNI L MOD SED  04/22/2017  . IR ANGIO VERTEBRAL SEL VERTEBRAL UNI R MOD SED  04/22/2017  . IR RADIOLOGIST EVAL & MGMT  04/30/2017  . MULTIPLE TOOTH EXTRACTIONS    . RADIOLOGY WITH ANESTHESIA N/A 08/08/2014   Procedure: RADIOLOGY WITH ANESTHESIA EMBOLIZATION;  Surgeon: Medication Radiologist, MD;  Location: [facility] OR;  Service: Radiology;  Laterality: N/A;  . RADIOLOGY WITH  ANESTHESIA N/A 11/21/2014   Procedure: RADIOLOGY WITH ANESTHESIA;  Surgeon: Oneal GroutSanjeev K Deveshwar, MD;  Location: [facility] OR;  Service: Radiology;  Laterality: N/A;  . RADIOLOGY WITH ANESTHESIA N/A 02/27/2015   Procedure: RADIOLOGY WITH ANESTHESIA;  Surgeon: Julieanne CottonSanjeev Deveshwar, MD;  Location: [facility] OR;  Service: Radiology;  Laterality: N/A;  . RADIOLOGY WITH ANESTHESIA N/A 09/25/2015   Procedure: RADIOLOGY WITH ANESTHESIA;  Surgeon: Julieanne CottonSanjeev Deveshwar, MD;  Location: [facility] OR;  Service: Radiology;  Laterality: N/A;  . RECTAL SURGERY  1980s   "tore it lifting heavy furniture; sewed it back together"  . SHOULDER ARTHROSCOPY WITH OPEN ROTATOR CUFF REPAIR Right 2016  . SHOULDER ARTHROSCOPY WITH ROTATOR CUFF REPAIR Left 1996  . SHOULDER OPEN ROTATOR CUFF REPAIR Left 1997   "took out 8inches of my collarbone"  . TEE WITHOUT CARDIOVERSION N/A 12/18/2016   Procedure: TRANSESOPHAGEAL ECHOCARDIOGRAM (TEE);  Surgeon: Orpah CobbAjay Kadakia, MD;  [facility];  Service: Cardiovascular;  Laterality: N/A;  . TRACHEOSTOMY  1964   at age 55 months old; for asthma  . TRACHEOSTOMY CLOSURE      Allergies: Tylox [oxycodone-acetaminophen] and Adhesive [tape]  Medications: Prior to Admission medications   Medication Sig Start Date End Date Taking? Authorizing Provider  acetaminophen (TYLENOL) 500 MG tablet Take 1 tablet (500 mg total) by mouth every 6 (six) hours as needed. Patient not taking: Reported on 05/04/2017 11/18/16   Everlene Farrieransie, William, PA-C  albuterol (PROVENTIL HFA;VENTOLIN HFA) 108 (90 BASE) MCG/ACT inhaler Inhale 2 puffs into the lungs every 6 (six) hours as needed for wheezing or shortness of breath.     [provider]  amiodarone (PACERONE) 200 MG tablet Take 1 tablet (200 mg total) by mouth daily. 12/20/16   Orpah CobbKadakia, Ajay, MD  apixaban (ELIQUIS) 5 MG TABS tablet Take 1 tablet (5 mg total) by mouth 2 (two) times daily. Patient not taking: Reported on 05/04/2017 12/19/16   Orpah CobbKadakia, Ajay, MD  aspirin EC 81 MG  tablet Take 81 mg by mouth daily.     [provider]  atorvastatin (LIPITOR) 20 MG tablet Take 1 tablet (20 mg total) by mouth daily at 6 PM. Patient taking differently: Take 20 mg by mouth every evening.  07/06/14   Calvert Cantorizwan, Saima, MD  clopidogrel (PLAVIX) 75 MG tablet Take 75 mg by mouth daily.    [provider]  esomeprazole (NEXIUM) 40 MG capsule Take 40 mg by mouth at bedtime as needed (for acid reflux).     [provider]  gabapentin (NEURONTIN) 100 MG capsule TAKE 1 CAPSULE BY MOUTH THREE TIMES DAILY 08/25/15   Marvel PlanXu, Jindong, MD  hydrochlorothiazide (HYDRODIURIL) 25 MG tablet Take 0.5 tablets (12.5 mg total) by mouth daily. 12/19/16   Orpah CobbKadakia, Ajay, MD  metFORMIN (GLUCOPHAGE) 500 MG tablet Take 1 tablet (500 mg  total) by mouth 2 (two) times daily with a meal. 07/06/14   Calvert Cantor, MD  nitroGLYCERIN (NITROSTAT) 0.4 MG SL tablet Place 1 tablet (0.4 mg total) under the tongue every 5 (five) minutes x 3 doses as needed for chest pain. 07/08/14   Orpah Cobb, MD  umeclidinium-vilanterol (ANORO ELLIPTA) 62.5-25 MCG/INH AEPB Inhale 1 puff into the lungs daily.     [provider]     Family History  Problem Relation Age of Onset  . Heart disease Mother        s/p 3V CABG  . Cancer Father 57       lung cancer; +tobacco  . Stroke Father   . Cancer Maternal Grandmother   . Heart disease Maternal Grandmother   . Cancer Paternal Grandmother   . Lupus Sister   . Multiple sclerosis Sister   . Anemia Daughter     Social History   Social History  . Marital status: Divorced    Spouse name: not together since 2007  . Number of children: 3  . Years of education: 10   Occupational History  . [employer]    airport    Social History Main Topics  . Smoking status: Former Smoker    Packs/day: 1.50    Years: 25.00    Types: Cigarettes    Start date: 01/15/1975    Quit date: 06/05/2014  . Smokeless tobacco: Never Used  . Alcohol use 3.0  oz/week    5 Cans of beer per week     Comment: none since April 2018  . Drug use: No  . Sexual activity: No     Comment: partner has had surgery to prevent pregnancy   Other Topics Concern  . Not on file   Social History Narrative   Lives with his son and his mother.  His son has no contact with the son's mother, though the patient is not legally separated from her.  She has a history of drug use and has been in prison several times.    Review of Systems  Constitutional: Negative for fatigue and fever.  Respiratory: Negative for cough and shortness of breath.   Cardiovascular: Negative for chest pain.  Psychiatric/Behavioral: Negative for behavioral problems and confusion.    Vital Signs: BP 122/74   Pulse (!) 55   Temp (!) 97.3 F (36.3 C) (Oral)   Resp 18   SpO2 96%   Physical Exam  Constitutional: He is oriented to person, place, and time. He appears well-developed.  Cardiovascular: Normal rate, regular rhythm and normal heart sounds.   Pulmonary/Chest: Effort normal and breath sounds normal. No respiratory distress.  Neurological: He is alert and oriented to person, place, and time.  Skin: Skin is warm and dry.  Psychiatric: He has a normal mood and affect. His behavior is normal. Judgment and thought content normal.  Nursing note and vitals reviewed.   Mallampati Score:  MD Evaluation Airway: WNL Heart: WNL Abdomen: WNL Chest/ Lungs: WNL ASA  Classification: 3 Mallampati/Airway Score: Two  Imaging: Ir Radiologist Eval & Mgmt  Result Date: 05/04/2017 EXAM: ESTABLISHED PATIENT OFFICE VISIT CHIEF COMPLAINT: Patient with multiple intracranial aneurysms. Worsening right-sided headaches. Current Pain Level: 1-10 HISTORY OF PRESENT ILLNESS: The patient is a 55 year old right-handed gentleman who has a history of multiple intracranial aneurysms. Has had endovascular treatment of a left middle cerebral artery bifurcation aneurysm with stent assisted coiling. The  patient underwent a follow-up diagnostic catheter arteriogram on 04/22/2017. The  diagnostic follow-up arteriogram revealed an approximately 4.7 mm x 4.4 mm right MCA bifurcation aneurysm with suggestion of mild increase in size compared to previous arteriogram of 11/21/2014. No significant change noted in the approximately 2.8 mm x 2 mm left anterior cerebral artery proximal A1 segment aneurysm. The previously endovascularly treated left MCA aneurysm with stent assisted coil remained obliterated and stable. Clinically, the patient has reported worsening headaches in the right temporal region. He claims this headache comes on suddenly and lasts for about 5-10 minutes associated with no other significant symptoms or signs. These headaches occur up to 4 or 5 times a day. He denies any other symptoms such as visual aberrations, blindness, speech difficulties, facial droop, or left-sided weakness associated with these. He denies any symptoms of loss of awareness or of seizures or loss of consciousness. The patient's past medical history, previous surgical history remain unchanged from the time of his interrogation 2 weeks ago for his diagnostic catheter arteriogram. His present medications are: Tylenol, albuterol inhaler which he uses as needed, amiodarone, Eliquis, aspirin 81 mg a day, Lipitor, Nexium as needed, gabapentin, hydrochlorothiazide, metformin, nitroglycerin 0.4 mg SL as needed, umeclidinium inhaler as needed. Allergies:  Allergic to adhesive tape and tylox. Social History: The patient denies smoking cigarettes or using illicit chemicals. Does not drink alcohol. Review of systems, otherwise, negative for pathologic symptomatology unless as mentioned above. PHYSICAL EXAMINATION: In no acute distress. Affect normal. Neurologically no lateralizing cranial nerve, motor, sensory coordination or station and gait abnormalities. ASSESSMENT AND PLAN: The patient's most recent arteriogram was reviewed with him. Brought  to his attention were the 2 aneurysms intracranially and the one treated on the left side in the MCA distribution. Given the patient's symptoms of right-sided temporal headache and the right middle cerebral artery region aneurysm, the option of endovascular treatment with primary coiling, or stent assisted coil under general anesthesia were discussed with the patient. The risks of a complication of 1-2% of thromboembolic stroke, and the remote possibility of intra procedural rupture with potential need for emergent surgery and remote possibility of a fatality were all reviewed and discussed in detail. Questions were answered to the patient's satisfaction. The patient wishes to proceed with endovascular treatment. Patient was informed that a staged treatment plan may be utilized depending on the exact morphology of the aneurysm in relation to the parent vessel. The patient was also advised that he would have to stop his Eliquis for at least 24 hours of the procedure. The patient would have to under be started on Plavix 75 mg at least 5 days prior to the procedure. The patient would continue on aspirin 81 mg a day. Clearance from the patient's cardiologist will be obtained. The patient was asked to call should he have any concerns or questions. Electronically Signed   By: [name] M.D.   On: 04/30/2017 19:00   Ir Angio Intra Extracran Sel Com Carotid Innominate Bilat Mod Sed  Result Date: 04/26/2017 INDICATION: Patient with right-sided headaches. Known history of multiple intracranial aneurysms. EXAM: BILATERAL COMMON CAROTID AND INNOMINATE ANGIOGRAPHY AND BILATERAL VERTEBRAL ARTERY ANGIOGRAMS MEDICATIONS: No antibiotic was administered within 1 hour of the procedure. ANESTHESIA/SEDATION: Versed 1 mg IV; Fentanyl 25 mcg IV. Moderate Sedation Time:  30 minutes. The patient was continuously monitored during the procedure by the interventional radiology nurse under my direct supervision. FLUOROSCOPY TIME:   Fluoroscopy Time: 7 minutes 6 seconds (1217 mGy). COMPLICATIONS: None immediate. TECHNIQUE: Informed written consent was obtained from the patient after a  thorough discussion of the procedural risks, benefits and alternatives. All questions were addressed. Maximal Sterile Barrier Technique was utilized including caps, mask, sterile gowns, sterile gloves, sterile drape, hand hygiene and skin antiseptic. A timeout was performed prior to the initiation of the procedure. PROCEDURE: The right groin was prepped and draped in the usual sterile fashion. Thereafter using modified Seldinger technique, transfemoral access into the right common femoral artery was obtained without difficulty. Over a 0.035 inch guidewire, a 5 French Pinnacle sheath was inserted. Through this, and also over 0.035 inch guidewire, a 5 French JB1 catheter was advanced to the aortic arch region and selectively positioned in the right common carotid artery, the right vertebral artery, the left common carotid artery and the left vertebral artery. FINDINGS: The right vertebral artery origin is normal. The vessel is seen to opacify normally to the cranial skull base. Wide patency is seen of the right vertebrobasilar junction and the right posterior-inferior cerebellar artery. The basilar artery, the posterior cerebral arteries, the superior cerebellar arteries and the anterior-inferior cerebellar arteries are seen to opacify into the capillary and venous phases. Non-opacified blood is seen in the basilar artery from the contralateral vertebral artery. The right common carotid arteriogram demonstrates the right external carotid artery and its major branches to be widely patent. The previously positioned stent extending from the distal right common carotid artery to the proximal right internal carotid artery appears widely patent with mild narrowing along its proximal 1/3 related to now intimal hyperplasia. Distal to this, however, the vessel is seen to  opacify normally to the cranial skull base. The petrous, the cavernous and the supraclinoid segments are widely patent. A small infundibulum is seen at the origin of the right posterior-inferior cerebellar artery. The right middle cerebral artery and the right anterior cerebral artery are seen to opacify into the capillary and venous phases. Scattered focal areas of caliber irregularity involving the superior division of the right internal carotid artery are suggestive of intracranial arteriosclerosis. Again noted arising in the right MCA bifurcation is a saccular aneurysm projecting superiorly and slightly laterally. This measures approximately 4.7 mm x 4.4 mm on the lateral projection. The left common carotid arteriogram demonstrates the left external carotid artery and its major branches to be widely patent. The left internal carotid artery at the bulb along its posterior and inferior aspect demonstrates a smooth shallow plaque which extends into the distal left common carotid artery posterior wall. No significant ulceration associated with this. Also distal to this there is a focal segmental area of stenosis of approximately 30% involving the left internal carotid artery proximally. Distal to this the vessel is seen to opacify to the cranial skull base. The petrous, cavernous and the supraclinoid segments are widely patent. The left middle cerebral artery opacifies normally into the capillary and venous phases. The previously endovascularly treated left MCA bifurcation region aneurysm with stent assisted coiling appears completely obliterated without evidence of coil compaction or recanalization. Wide patency is seen of the stent across the neck of the aneurysm. The left anterior cerebral artery opacify into the capillary and venous phases. Again seen arising from the proximal left anterior cerebral artery A1 segment is a saccular aneurysm projecting superiorly and measures approximately 2.8 mm x 2 mm. The left  vertebral artery origin demonstrates mild stenosis. The vessel is seen to opacify normally to the cranial skull base. Wide patency is seen of the left vertebrobasilar junction oand the left posterior-inferior cerebellar artery. The basilar artery, the posterior cerebral arteries,  the superior cerebellar arteries and the anterior-inferior cerebellar arteries demonstrate normal opacification into delayed arterial phase on lateral projection. 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 The angiographic findings were reviewed with the patient. The patient will be seen in consultation for discussion regarding management of these 2 aneurysms. The patient leaves with good understanding and agreement with the above management plan. Electronically Signed   By: [name] M.D.   On: 04/23/2017 09:18   Ir Angio Vertebral Sel Subclavian Innominate Uni L Mod Sed  Result Date: 04/26/2017 INDICATION: Patient with right-sided headaches. Known history of multiple intracranial aneurysms. EXAM: BILATERAL COMMON CAROTID AND INNOMINATE ANGIOGRAPHY AND BILATERAL VERTEBRAL ARTERY ANGIOGRAMS MEDICATIONS: No antibiotic was administered within 1 hour of the procedure. ANESTHESIA/SEDATION: Versed 1 mg IV; Fentanyl 25 mcg IV. Moderate Sedation Time:  30 minutes. The patient was continuously monitored during the procedure by the interventional radiology nurse under my direct supervision. FLUOROSCOPY TIME:  Fluoroscopy Time: 7  minutes 6 seconds (1217 mGy). COMPLICATIONS: None immediate. TECHNIQUE: Informed written consent was obtained from the patient after a thorough discussion of the procedural risks, benefits and alternatives. All questions were addressed. Maximal Sterile Barrier Technique was utilized including caps, mask, sterile gowns, sterile gloves, sterile drape, hand hygiene and skin antiseptic. A timeout was performed prior to the initiation of the procedure. PROCEDURE: The right groin was prepped and draped in the usual sterile fashion. Thereafter using modified Seldinger technique, transfemoral access into the right common femoral artery was obtained without difficulty. Over a 0.035 inch guidewire, a 5 French Pinnacle sheath was inserted. Through this, and also over 0.035 inch guidewire, a 5 French JB1 catheter was advanced to the aortic arch region and selectively positioned in the right common carotid artery, the right vertebral artery, the left common carotid artery and the left vertebral artery. FINDINGS: The right vertebral artery origin is normal. The vessel is seen to opacify normally to the cranial skull base. Wide patency is seen of the right vertebrobasilar junction and the right posterior-inferior cerebellar artery. The basilar artery, the posterior cerebral arteries, the superior cerebellar arteries and the anterior-inferior cerebellar arteries are seen to opacify into the capillary and venous phases. Non-opacified blood is seen in the basilar artery from the contralateral vertebral artery. The right common carotid arteriogram demonstrates the right external carotid artery and its major branches to be widely patent. The previously positioned stent extending from the distal right common carotid artery to the proximal right internal carotid artery appears widely patent with mild narrowing along its proximal 1/3 related to now intimal hyperplasia. Distal to this, however, the vessel is seen to opacify normally to the  cranial skull base. The petrous, the cavernous and the supraclinoid segments are widely patent. A small infundibulum is seen at the origin of the right posterior-inferior cerebellar artery. The right middle cerebral artery and the right anterior cerebral artery are seen to opacify into the capillary and venous phases. Scattered focal areas of caliber irregularity involving the superior division of the right internal carotid artery are suggestive of intracranial arteriosclerosis. Again noted arising  in the right MCA bifurcation is a saccular aneurysm projecting superiorly and slightly laterally. This measures approximately 4.7 mm x 4.4 mm on the lateral projection. The left common carotid arteriogram demonstrates the left external carotid artery and its major branches to be widely patent. The left internal carotid artery at the bulb along its posterior and inferior aspect demonstrates a smooth shallow plaque which extends into the distal left common carotid artery posterior wall. No significant ulceration associated with this. Also distal to this there is a focal segmental area of stenosis of approximately 30% involving the left internal carotid artery proximally. Distal to this the vessel is seen to opacify to the cranial skull base. The petrous, cavernous and the supraclinoid segments are widely patent. The left middle cerebral artery opacifies normally into the capillary and venous phases. The previously endovascularly treated left MCA bifurcation region aneurysm with stent assisted coiling appears completely obliterated without evidence of coil compaction or recanalization. Wide patency is seen of the stent across the neck of the aneurysm. The left anterior cerebral artery opacify into the capillary and venous phases. Again seen arising from the proximal left anterior cerebral artery A1 segment is a saccular aneurysm projecting superiorly and measures approximately 2.8 mm x 2 mm. The left vertebral artery origin  demonstrates mild stenosis. The vessel is seen to opacify normally to the cranial skull base. Wide patency is seen of the left vertebrobasilar junction oand the left posterior-inferior cerebellar artery. The basilar artery, the posterior cerebral arteries, the superior cerebellar arteries and the anterior-inferior cerebellar arteries demonstrate normal opacification into delayed arterial phase on lateral projection. 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 The angiographic findings were reviewed with the patient. The patient will be seen in consultation for discussion regarding management of these 2 aneurysms. The patient leaves with good understanding and agreement with the above management plan. Electronically Signed   By: Julieanne CottonSanjeev  Deveshwar M.D.   On: 04/23/2017 09:18   Ir Angio Vertebral Sel Vertebral Uni R Mod Sed  Result Date: 04/26/2017 INDICATION: Patient with right-sided headaches. Known history of multiple intracranial aneurysms. EXAM: BILATERAL COMMON CAROTID AND INNOMINATE ANGIOGRAPHY AND BILATERAL VERTEBRAL ARTERY ANGIOGRAMS MEDICATIONS: No antibiotic was administered within 1 hour of the procedure. ANESTHESIA/SEDATION: Versed 1 mg IV; Fentanyl 25 mcg IV. Moderate Sedation Time:  30 minutes. The patient was continuously monitored during the procedure by the interventional radiology nurse under my direct supervision. FLUOROSCOPY TIME:  Fluoroscopy Time: 7 minutes 6 seconds (1217 mGy).  COMPLICATIONS: None immediate. TECHNIQUE: Informed written consent was obtained from the patient after a thorough discussion of the procedural risks, benefits and alternatives. All questions were addressed. Maximal Sterile Barrier Technique was utilized including caps, mask, sterile gowns, sterile gloves, sterile drape, hand hygiene and skin antiseptic. A timeout was performed prior to the initiation of the procedure. PROCEDURE: The right groin was prepped and draped in the usual sterile fashion. Thereafter using modified Seldinger technique, transfemoral access into the right common femoral artery was obtained without difficulty. Over a 0.035 inch guidewire, a 5 French Pinnacle sheath was inserted. Through  this, and also over 0.035 inch guidewire, a 5 French JB1 catheter was advanced to the aortic arch region and selectively positioned in the right common carotid artery, the right vertebral artery, the left common carotid artery and the left vertebral artery. FINDINGS: The right vertebral artery origin is normal. The vessel is seen to opacify normally to the cranial skull base. Wide patency is seen of the right vertebrobasilar junction and the right posterior-inferior cerebellar artery. The basilar artery, the posterior cerebral arteries, the superior cerebellar arteries and the anterior-inferior cerebellar arteries are seen to opacify into the capillary and venous phases. Non-opacified blood is seen in the basilar artery from the contralateral vertebral artery. The right common carotid arteriogram demonstrates the right external carotid artery and its major branches to be widely patent. The previously positioned stent extending from the distal right common carotid artery to the proximal right internal carotid artery appears widely patent with mild narrowing along its proximal 1/3 related to now intimal hyperplasia. Distal to this, however, the vessel is seen to opacify normally to the cranial skull base. The  petrous, the cavernous and the supraclinoid segments are widely patent. A small infundibulum is seen at the origin of the right posterior-inferior cerebellar artery. The right middle cerebral artery and the right anterior cerebral artery are seen to opacify into the capillary and venous phases. Scattered focal areas of caliber irregularity involving the superior division of the right internal carotid artery are suggestive of intracranial arteriosclerosis. Again noted arising in the right MCA bifurcation is a saccular aneurysm projecting superiorly and slightly laterally. This measures approximately 4.7 mm x 4.4 mm on the lateral projection. The left common carotid arteriogram demonstrates the left external carotid artery and its major branches to be widely patent. The left internal carotid artery at the bulb along its posterior and inferior aspect demonstrates a smooth shallow plaque which extends into the distal left common carotid artery posterior wall. No significant ulceration associated with this. Also distal to this there is a focal segmental area of stenosis of approximately 30% involving the left internal carotid artery proximally. Distal to this the vessel is seen to opacify to the cranial skull base. The petrous, cavernous and the supraclinoid segments are widely patent. The left middle cerebral artery opacifies normally into the capillary and venous phases. The previously endovascularly treated left MCA bifurcation region aneurysm with stent assisted coiling appears completely obliterated without evidence of coil compaction or recanalization. Wide patency is seen of the stent across the neck of the aneurysm. The left anterior cerebral artery opacify into the capillary and venous phases. Again seen arising from the proximal left anterior cerebral artery A1 segment is a saccular aneurysm projecting superiorly and measures approximately 2.8 mm x 2 mm. The left vertebral artery origin demonstrates mild  stenosis. The vessel is seen to opacify normally to the cranial skull base. Wide patency is seen of the left vertebrobasilar junction oand the left posterior-inferior cerebellar artery. The basilar artery, the posterior cerebral arteries, the superior cerebellar arteries and the anterior-inferior cerebellar arteries demonstrate normal opacification into delayed arterial phase on lateral projection. 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 The angiographic findings were reviewed with the patient. The patient will be seen in consultation for discussion regarding management of these 2 aneurysms. The patient leaves with good understanding and agreement with the above management plan. Electronically Signed   By: [name] M.D.   On: 04/23/2017 09:18    Labs:  CBC:  Recent Labs  01/12/17 1837 03/30/17 1945 04/22/17 0650 05/05/17 1342  WBC 11.1* 7.4 6.6 6.5  HGB 13.9 14.1 13.5 13.5  HCT 41.3 42.2 40.5 39.9  PLT 228 244 224 222    COAGS:  Recent Labs  12/18/16 1018 04/22/17 0650 05/05/17 1342  INR 1.02 0.99 0.93  APTT  --  30 28    BMP:  Recent Labs  01/12/17 1837 03/29/17 1127 03/30/17 1945 04/22/17 0650 05/05/17 1342  NA 137  --  138 138 138  K 3.8  --  3.7 3.6 3.6  CL 102  --  100* 102 102  CO2 26  --  28 28 28   GLUCOSE 115*  --  100* 113* 103*  BUN 18  --  14 9 11   CALCIUM 8.7*  --  10.0 9.0 9.3  CREATININE 0.94 0.90 1.18 0.96 1.04  GFRNONAA >60  --  >60  >60 >60  GFRAA >60  --  >60 >60 >60    LIVER FUNCTION TESTS:  Recent Labs  10/19/16 0824 12/17/16 1817 03/30/17 1945  BILITOT 0.5 0.5 0.4  AST 19 19 22   ALT 21 20 21   ALKPHOS 51 52 63  PROT 6.5 6.3* 7.1  ALBUMIN 3.9 3.4* 4.4    TUMOR MARKERS: No results for input(s): AFPTM, CEA, CA199, CHROMGRNA in the last 8760 hours.  Assessment and Plan: Patient with past medical history of CVA, stenosis, and aneurysm diagnosed by previous angiogram presents with new complaint of recent headaches.  He underwent recent angiogram with Dr. [name] 04/22/17 which identified a 4.7 x 4.4 mm right MCA bifurcation anuerysm. Patient presents for intervention today.  He has continued with headaches. He understands intervention will be based on findings and process today.  It is possible he may need a second intervention int he future.  He has been NPO today.  Risks and benefits discussed with the patient including, but not limited to bleeding, infection, vascular injury or contrast induced renal failure. All of the patient's questions were answered, patient is agreeable to proceed. Consent signed and in chart.  Thank you for this interesting consult.  I greatly enjoyed meeting [name] and look forward to participating in their care.  A copy of this report was sent to the requesting provider on this date.  Electronically Signed: Hoyt Koch, PA 05/10/2017, 8:08 AM   I spent a total of  30 Minutes   in face to face in clinical consultation, greater than 50% of which was counseling/coordinating care for aneurysm.

## 2017-05-10 NOTE — Progress Notes (Signed)
ANTICOAGULATION CONSULT NOTE - Follow Up Consult  Pharmacy Consult for Heparin Indication: cerebral angiogram  Allergies  Allergen Reactions  . Tylox [Oxycodone-Acetaminophen] Hives, Other (See Comments) and Rash    Sweating, shaking Reaction: Tremors and diaphoresis   . Adhesive [Tape] Rash    Patient Measurements: Height: 5\' 8"  (172.7 cm) Weight: 254 lb 10.1 oz (115.5 kg) IBW/kg (Calculated) : 68.4 Heparin Dosing Weight: 95 kg  Vital Signs: Temp: 98.1 F (36.7 C) (07/30 2000) Temp Source: Oral (07/30 2000) BP: 125/57 (07/30 2200) Pulse Rate: 67 (07/30 2200)  Labs:  Recent Labs  05/10/17 2126  HEPARINUNFRC 0.10*    Estimated Creatinine Clearance: 99 mL/min (by C-G formula based on SCr of 1.04 mg/dL).   Medications:  Infusions:  . sodium chloride 75 mL/hr at 05/10/17 1405  . clevidipine    . heparin 750 Units/hr (05/10/17 1324)  . lactated ringers 50 mL/hr (05/10/17 0744)    Assessment: 56 yom presented with an aneurysm requiring cerebral angiogram. Pre-op CBC is WNL and pt was on an anticoagulant PTA which has been on hold.  Heparin level is therapeutic at 0.1- no change.   Goal of Therapy:  Heparin level 0.1 to 0.25 units/ml Monitor platelets by anticoagulation protocol: Yes   Plan:  Continue heparin at 750 units/hr *Heparin off at 0700 tomorrow  Link SnufferJessica Kristapher [place], PharmD, BCPS Clinical Pharmacist Clinical phone 05/10/2017 until [phone]- [phone] After hours, please call #[phone] 05/10/2017,10:10 PM

## 2017-05-10 NOTE — Progress Notes (Signed)
Patient ID: [name], male   DOB: 08/25/1961, 56 y.o.   MRN: [id number] Patient is s/p staged embolization of a wide neck R MCA bifurcation aneurysm with his first stent.  He is doing well with no complaints except a headache.  He denies any vision changes.    PE: Neuro: intact, PEERL, facial muscles intact, good UE coordination, equal strength in LE bilaterally.  Good pedal pulses bilaterally Skin: R CFA site is c/d/i.  No bleeding  A/P: S/p stent of R MCA aneurysm  Advance diet May sit up SSI for DM Resume home medications Cont plavix, check P2Y12 in am Hopefully home in the am  [name] E 4:21 PM 05/10/2017

## 2017-05-10 NOTE — Progress Notes (Signed)
Patient ID: [name], male   DOB: 01/11/1961, 56 y.o.   MRN: [id number] INR . Post procedure.  Extubated withoiut difficulty. Denies any H/As,N/V visual ,motor or sensory complaints. VSS  Speech  and comprehension  normal. Pupils 2mm R = Lt . No facial asymmetry. Tongue midline. No pronation  drift. RT goin soft. No hematoma. or bleeding. Pulses 2+ DPs ,and dopplerable PTs bilaterally. S.Thorsten Climer MD.

## 2017-05-10 NOTE — Anesthesia Preprocedure Evaluation (Signed)
Anesthesia Evaluation  Patient identified by MRN, date of birth, ID band Patient awake    Reviewed: Allergy & Precautions, NPO status , Patient's Chart, lab work & pertinent test results  History of Anesthesia Complications (+) PONV and history of anesthetic complications  Airway Mallampati: II   Neck ROM: full    Dental   Pulmonary asthma , sleep apnea , COPD, former smoker,    breath sounds clear to auscultation       Cardiovascular hypertension, + Peripheral Vascular Disease  + dysrhythmias Atrial Fibrillation  Rhythm:regular Rate:Normal     Neuro/Psych  Headaches, PSYCHIATRIC DISORDERS Depression Bipolar Disorder TIACVA    GI/Hepatic hiatal hernia, GERD  ,  Endo/Other  diabetes, Type obesity  Renal/GU      Musculoskeletal  (+) Arthritis ,   Abdominal   Peds  Hematology   Anesthesia Other Findings   Reproductive/Obstetrics                             Anesthesia Physical Anesthesia Plan  ASA: III  Anesthesia Plan: General   Post-op Pain Management:    Induction: Intravenous  PONV Risk Score and Plan: 3 and Ondansetron, Dexamethasone, Midazolam, Propofol infusion and Treatment may vary due to age or medical condition  Airway Management Planned: Oral ETT  Additional Equipment: Arterial line  Intra-op Plan:   Post-operative Plan: Extubation in OR  Informed Consent: I have reviewed the patients History and Physical, chart, labs and discussed the procedure including the risks, benefits and alternatives for the proposed anesthesia with the patient or authorized representative who has indicated his/her understanding and acceptance.     Plan Discussed with: CRNA, Anesthesiologist and Surgeon  Anesthesia Plan Comments:         Anesthesia Quick Evaluation

## 2017-05-10 NOTE — Transfer of Care (Signed)
Immediate Anesthesia Transfer of Care Note  Patient: Ivan ClontsRichard L [name]  Procedure(s) Performed: Procedure(s): EMBOLIZATION (N/A)  Patient Location: PACU  Anesthesia Type:General  Level of Consciousness: awake, oriented and patient cooperative  Airway & Oxygen Therapy: Patient Spontanous Breathing and Patient connected to nasal cannula oxygen  Post-op Assessment: Report given to RN, Post -op Vital signs reviewed and stable and Patient moving all extremities  Post vital signs: Reviewed and stable  Last Vitals: There were no vitals filed for this visit.  Last Pain:  Vitals:   05/10/17 0702  PainSc: 4       Patients Stated Pain Goal: 3 (05/10/17 [id number])  Complications: No apparent anesthesia complications

## 2017-05-10 NOTE — Procedures (Signed)
S/P RT common carotid arteriogra. RT CFA approach. Findings. 1.Staged embolization   of  Wide neck  RT MCA bifurcation aneurysm.

## 2017-05-10 NOTE — Plan of Care (Signed)
Problem: Pain Managment: Goal: General experience of comfort will improve Outcome: Progressing Pt comfortably resting stretcher at this time.

## 2017-05-10 NOTE — Anesthesia Procedure Notes (Signed)
Arterial Line Insertion Start/End7/30/2018 8:15 AM, 05/10/2017 8:20 AM Performed by: Charm BargesBUTLER, Johnhenry Tippin R, CRNA  Preanesthetic checklist: patient identified, IV checked, site marked, risks and benefits discussed, surgical consent, monitors and equipment checked, pre-op evaluation, timeout performed and anesthesia consent Lidocaine 1% used for infiltration Left, radial was placed Catheter size: 20 G Hand hygiene performed , maximum sterile barriers used  and Seldinger technique used  Attempts: 1 Procedure performed without using ultrasound guided technique. Following insertion, dressing applied and Biopatch. Patient tolerated the procedure well with no immediate complications.

## 2017-05-11 ENCOUNTER — Encounter (HOSPITAL_COMMUNITY): Payer: Self-pay | Admitting: Interventional Radiology

## 2017-05-11 DIAGNOSIS — I729 Aneurysm of unspecified site: Secondary | ICD-10-CM | POA: Diagnosis present

## 2017-05-11 DIAGNOSIS — I671 Cerebral aneurysm, nonruptured: Secondary | ICD-10-CM | POA: Diagnosis not present

## 2017-05-11 LAB — BASIC METABOLIC PANEL
ANION GAP: 8 (ref 5–15)
BUN: 9 mg/dL (ref 6–20)
CALCIUM: 8.2 mg/dL — AB (ref 8.9–10.3)
CHLORIDE: 103 mmol/L (ref 101–111)
CO2: 25 mmol/L (ref 22–32)
CREATININE: 0.77 mg/dL (ref 0.61–1.24)
GFR calc non Af Amer: >60 mL/min (ref >60)
Glucose, Bld: 146 mg/dL — ABNORMAL HIGH (ref 65–99)
Potassium: 3.7 mmol/L (ref 3.5–5.1)
SODIUM: 136 mmol/L (ref 135–145)

## 2017-05-11 LAB — CBC WITH DIFFERENTIAL/PLATELET
BASOS PCT: 0 %
Basophils Absolute: 0 10*3/uL (ref 0.0–0.1)
EOS ABS: 0 10*3/uL (ref 0.0–0.7)
Eosinophils Relative: 0 %
HEMATOCRIT: 36.1 % — AB (ref 39.0–52.0)
HEMOGLOBIN: 12.1 g/dL — AB (ref 13.0–17.0)
LYMPHS ABS: 1.3 10*3/uL (ref 0.7–4.0)
Lymphocytes Relative: 12 %
MCH: 29.3 pg (ref 26.0–34.0)
MCHC: 33.5 g/dL (ref 30.0–36.0)
MCV: 87.4 fL (ref 78.0–100.0)
MONOS PCT: 6 %
Monocytes Absolute: 0.6 10*3/uL (ref 0.1–1.0)
NEUTROS ABS: 8.6 10*3/uL — AB (ref 1.7–7.7)
NEUTROS PCT: 82 %
Platelets: 206 10*3/uL (ref 150–400)
RBC: 4.13 MIL/uL — AB (ref 4.22–5.81)
RDW: 12.8 % (ref 11.5–15.5)
WBC: 10.4 10*3/uL (ref 4.0–10.5)

## 2017-05-11 LAB — GLUCOSE, CAPILLARY
Glucose-Capillary: 121 mg/dL — ABNORMAL HIGH (ref 65–99)
Glucose-Capillary: 136 mg/dL — ABNORMAL HIGH (ref 65–99)

## 2017-05-11 LAB — PLATELET INHIBITION P2Y12: PLATELET FUNCTION P2Y12: 159 [PRU] — AB (ref 194–418)

## 2017-05-11 NOTE — Discharge Summary (Signed)
Patient ID: Ivan ClontsRichard L Fewell MRN: [id number] DOB/AGE: 55/02/1961 55 y.o.  Admit date: 05/10/2017 Discharge date: 05/11/2017  Supervising Physician: Julieanne Cottoneveshwar, Sanjeev  Admission Diagnoses: brain aneurysm  Discharge Diagnoses:  Active Problems:   Brain aneurysm   Aneurysm [facility](HCC)   Discharged Condition: good  Hospital Course: The patient was admitted after he underwent stenting of his brain aneurysm.  He did well with this procedure.  He complained of a headache after the procedure but this went away on its own.  He otherwise has a normal P2Y12 this am of 159.  He is tolerating a regular diet and voiding well.  He has ambulated and is otherwise stable for DC home today on his plavix and ASA.  He has been told to hold his eliquis until he returns in 2 weeks for follow up.  Consults: None  Discharge Exam: Blood pressure (!) 120/57, pulse (!) 51, temperature (!) 97.5 F (36.4 C), temperature source Oral, resp. rate 16, height 5\' 8"  (1.727 m), weight 254 lb 10.1 oz (115.5 kg), SpO2 100 %. General appearance: alert, cooperative and no distress Cardio: regular rate and rhythm Neurologic: normal exam. Good UE coordination and strength as well as LE strength.  Good pedal pulses.  Symmetrical facial movements of showing teeth and eye closure. Incision/Wound: R CFA site is c/d/i  Disposition: 01-Home or Self Care   Allergies as of 05/11/2017      Reactions   Tylox [oxycodone-acetaminophen] Hives, Other (See Comments), Rash   Sweating, shaking Reaction: Tremors and diaphoresis    Adhesive [tape] Rash      Medication List    STOP taking these medications   acetaminophen 500 MG tablet Commonly known as:  TYLENOL   apixaban 5 MG Tabs tablet Commonly known as:  ELIQUIS     TAKE these medications   albuterol 108 (90 Base) MCG/ACT inhaler Commonly known as:  PROVENTIL HFA;VENTOLIN HFA Inhale 2 puffs into the lungs every 6 (six) hours as needed for wheezing or shortness of breath.   amiodarone 200 MG tablet Commonly known as:  PACERONE Take 1 tablet (200 mg total) by mouth daily.   ANORO ELLIPTA 62.5-25 MCG/INH Aepb Generic drug:  umeclidinium-vilanterol Inhale 1 puff into the lungs daily.   aspirin EC 81 MG tablet Take 81 mg by mouth daily.   atorvastatin 20 MG tablet Commonly known as:  LIPITOR Take 1 tablet (20 mg total) by mouth daily at 6 PM. What changed:  when to take this   clopidogrel 75 MG tablet Commonly known as:  PLAVIX Take 75 mg by mouth daily.   esomeprazole 40 MG capsule Commonly known as:  NEXIUM Take 40 mg by mouth at bedtime as needed (for acid reflux).   gabapentin 100 MG capsule Commonly known as:  NEURONTIN TAKE 1 CAPSULE BY MOUTH THREE TIMES DAILY   hydrochlorothiazide 25 MG tablet Commonly known as:  HYDRODIURIL Take 0.5 tablets (12.5 mg total) by mouth daily.   metFORMIN 500 MG tablet Commonly known as:  GLUCOPHAGE Take 1 tablet (500 mg total) by mouth 2 (two) times daily with a meal.   nitroGLYCERIN 0.4 MG SL tablet Commonly known as:  NITROSTAT Place 1 tablet (0.4 mg total) under the tongue every 5 (five) minutes x 3 doses as needed for chest pain.      Follow-up Information    Julieanne Cottoneveshwar, Sanjeev, MD Follow up in 2 week(s).   Specialty:  Interventional Radiology Why:  Victorino DikeJennifer will call you to set this up Contact  information: [address] [place] [address] [phone]            Electronically Signed: Letha CapeSBORNE,Anahla Bevis E 05/11/2017, 9:28 AM   I have spent Less Than 30 Minutes discharging Ivan Clontsichard L Westermeyer.

## 2017-05-11 NOTE — Discharge Instructions (Signed)
Angiogram, Care After This sheet gives you information about how to care for yourself after your procedure. Your health care provider may also give you more specific instructions. If you have problems or questions, contact your health care provider. What can I expect after the procedure? After the procedure, it is common to have bruising and tenderness at the catheter insertion area. Follow these instructions at home: Insertion site care  Follow instructions from your health care provider about how to take care of your insertion site. Make sure you: ? Wash your hands with soap and water before you change your bandage (dressing). If soap and water are not available, use hand sanitizer. ? Change your dressing as told by your health care provider.  Do not take baths, swim, or use a hot tub until your health care provider approves.  You may shower 24-48 hours after the procedure or as told by your health care provider. ? Gently wash the site with plain soap and water. ? Pat the area dry with a clean towel. ? Do not rub the site. This may cause bleeding.  Do not apply powder or lotion to the site. Keep the site clean and dry.  Check your insertion site every day for signs of infection. Check for: ? Redness, swelling, or pain. ? Fluid or blood. ? Warmth. ? Pus or a bad smell. Activity  Rest as told by your health care provider, usually for 1-2 days.  Do not lift anything that is heavier than 10 lbs. (4.5 kg) or as told by your health care provider.  Do not drive for 2 weeks after this procedure  Do not drive or use heavy machinery while taking prescription pain medicine. General instructions  Return to your normal activities as told by your health care provider, usually in about a week. Ask your health care provider what activities are safe for you.  If the catheter site starts bleeding, lie flat and put pressure on the site. If the bleeding does not stop, get help right away. This is  a medical emergency.  Drink enough fluid to keep your urine clear or pale yellow. This helps flush the contrast dye from your body.  Take over-the-counter and prescription medicines only as told by your health care provider.  Keep all follow-up visits as told by your health care provider. This is important. Contact a health care provider if:  You have a fever or chills.  You have redness, swelling, or pain around your insertion site.  You have fluid or blood coming from your insertion site.  The insertion site feels warm to the touch.  You have pus or a bad smell coming from your insertion site.  You have bruising around the insertion site.  You notice blood collecting in the tissue around the catheter site (hematoma). The hematoma may be painful to the touch. Get help right away if:  You have severe pain at the catheter insertion area.  The catheter insertion area swells very fast.  The catheter insertion area is bleeding, and the bleeding does not stop when you hold steady pressure on the area.  The area near or just beyond the catheter insertion site becomes pale, cool, tingly, or numb. These symptoms may represent a serious problem that is an emergency. Do not wait to see if the symptoms will go away. Get medical help right away. Call your local emergency services (911 in the [place] Do not drive yourself to the hospital. Summary  After the procedure, it  is common to have bruising and tenderness at the catheter insertion area.  After the procedure, it is important to rest and drink plenty of fluids.  Do not take baths, swim, or use a hot tub until your health care provider says it is okay to do so. You may shower 24-48 hours after the procedure or as told by your health care provider.  If the catheter site starts bleeding, lie flat and put pressure on the site. If the bleeding does not stop, get help right away. This is a medical emergency. This information is not intended  to replace advice given to you by your health care provider. Make sure you discuss any questions you have with your health care provider. Document Released: 04/16/2005 Document Revised: 09/02/2016 Document Reviewed: 09/02/2016 Elsevier Interactive Patient Education  2017 [employer]

## 2017-05-11 NOTE — Progress Notes (Signed)
Pt walked 2 laps around unit, a-line and foley discontinued.  IVs removed.  Pt has voided x2.  Discharge instructions given to patient.

## 2017-05-11 NOTE — Care Management CC44 (Signed)
Condition Code 44 Documentation Completed  Patient Details  Name: Ivan ClontsRichard L [name] MRN: [id number] Date of Birth: 07/12/1961   Condition Code 44 given:  Yes Patient signature on Condition Code 44 notice:  Yes Documentation of 2 MD's agreement:  Yes Code 44 added to claim:  Yes    Glennon Macmerson, Judye Lorino M, RN 05/11/2017, 10:48 AM

## 2017-05-11 NOTE — Care Management Obs Status (Signed)
MEDICARE OBSERVATION STATUS NOTIFICATION   Patient Details  Name: [name] MRN: [id number] Date of Birth: 06/20/1961   Medicare Observation Status Notification Given:  Yes    Glennon Macmerson, Zamorah Ailes M, RN 05/11/2017, 10:48 AM

## 2017-05-11 NOTE — Anesthesia Postprocedure Evaluation (Signed)
Anesthesia Post Note  Patient: [name]  Procedure(s) Performed: Procedure(s) (LRB): EMBOLIZATION (N/A)     Patient location during evaluation: PACU Anesthesia Type: General Level of consciousness: awake and alert and patient cooperative Pain management: pain level controlled Vital Signs Assessment: post-procedure vital signs reviewed and stable Respiratory status: spontaneous breathing and respiratory function stable Cardiovascular status: stable Anesthetic complications: no    Last Vitals:  Vitals:   05/11/17 0700 05/11/17 0800  BP: (!) 120/57   Pulse: (!) 51   Resp: 16   Temp:  (!) 36.4 C    Last Pain:  Vitals:   05/11/17 0800  TempSrc: Oral  PainSc:                  Tiearra Colwell S

## 2017-05-12 ENCOUNTER — Other Ambulatory Visit (HOSPITAL_COMMUNITY): Payer: Self-pay | Admitting: Interventional Radiology

## 2017-05-12 ENCOUNTER — Encounter (HOSPITAL_COMMUNITY): Payer: Self-pay | Admitting: Interventional Radiology

## 2017-05-12 DIAGNOSIS — I729 Aneurysm of unspecified site: Secondary | ICD-10-CM

## 2017-05-17 DIAGNOSIS — R7981 Abnormal blood-gas level: Secondary | ICD-10-CM | POA: Insufficient documentation

## 2017-05-25 ENCOUNTER — Ambulatory Visit (HOSPITAL_COMMUNITY): Payer: Medicare Other

## 2017-06-01 ENCOUNTER — Ambulatory Visit (HOSPITAL_COMMUNITY): Admission: RE | Admit: 2017-06-01 | Payer: Medicare Other | Source: Ambulatory Visit

## 2017-06-07 DIAGNOSIS — L309 Dermatitis, unspecified: Secondary | ICD-10-CM | POA: Insufficient documentation

## 2017-06-15 ENCOUNTER — Ambulatory Visit (HOSPITAL_COMMUNITY)
Admission: RE | Admit: 2017-06-15 | Discharge: 2017-06-15 | Disposition: A | Discharge status: Home / Self Care | Payer: Medicare Other | Source: Ambulatory Visit | Attending: Interventional Radiology | Admitting: Interventional Radiology

## 2017-06-15 DIAGNOSIS — I729 Aneurysm of unspecified site: Secondary | ICD-10-CM

## 2017-06-15 HISTORY — PX: IR RADIOLOGIST EVAL & MGMT: IMG5224

## 2017-06-18 ENCOUNTER — Encounter (HOSPITAL_COMMUNITY): Payer: Self-pay | Admitting: Interventional Radiology

## 2017-06-21 ENCOUNTER — Encounter: Payer: Self-pay | Admitting: Neurology

## 2017-06-22 ENCOUNTER — Ambulatory Visit (INDEPENDENT_AMBULATORY_CARE_PROVIDER_SITE_OTHER): Payer: Medicare Other | Admitting: Neurology

## 2017-06-22 ENCOUNTER — Encounter: Payer: Self-pay | Admitting: Neurology

## 2017-06-22 VITALS — BP 135/75 | HR 56 | Ht 68.0 in | Wt 257.0 lb

## 2017-06-22 DIAGNOSIS — I669 Occlusion and stenosis of unspecified cerebral artery: Secondary | ICD-10-CM | POA: Insufficient documentation

## 2017-06-22 DIAGNOSIS — I671 Cerebral aneurysm, nonruptured: Secondary | ICD-10-CM

## 2017-06-22 DIAGNOSIS — J449 Chronic obstructive pulmonary disease, unspecified: Secondary | ICD-10-CM | POA: Diagnosis not present

## 2017-06-22 DIAGNOSIS — E662 Morbid (severe) obesity with alveolar hypoventilation: Secondary | ICD-10-CM | POA: Diagnosis not present

## 2017-06-22 DIAGNOSIS — R351 Nocturia: Secondary | ICD-10-CM

## 2017-06-22 DIAGNOSIS — J4489 Other specified chronic obstructive pulmonary disease: Secondary | ICD-10-CM | POA: Insufficient documentation

## 2017-06-22 DIAGNOSIS — Z6838 Body mass index (BMI) 38.0-38.9, adult: Secondary | ICD-10-CM

## 2017-06-22 DIAGNOSIS — Z9889 Other specified postprocedural states: Secondary | ICD-10-CM

## 2017-06-22 DIAGNOSIS — R0902 Hypoxemia: Secondary | ICD-10-CM | POA: Diagnosis not present

## 2017-06-22 NOTE — Progress Notes (Signed)
SLEEP MEDICINE CLINIC   Provider:  Melvyn [name], M D  Primary Care Physician:  Ivan Harries, MD   Referring Provider: Tracey Harries, MD  Via Dr. [name], MD- Dr. Pearlean Brownie and Dr. Roda [name]    Chief Complaint  Patient presents with  . New Patient (Initial Visit)    pt pt here with mother, room 10, brain surgery on 30th of this month, O2 level dropped down to 12/13. Pt has another surgery coming up on Sept 24th and the MD was wanting him to have been ruled out for sleep apnea.     HPI:  [name] is a 55 y.o. male , seen here as in a referral  from Dr. Everlene [name] for a sleep evaluation.   Patient of Dr. Roda [name] , who treated him for a stroke in 2015, followed up with Dr. Mateo [name] as well. Has been undergoing aneurysmata  stenting/ coiling with Ivan Nora, MD.  [name] is a 56 year old Caucasian right-handed male, who presents today to the sleep consultation with his mother. He stated that Dr. Corliss [name] spoke to him after his last interventional radiology procedure, which was meant to coil a nonbleeding aneurysm. He was told that his oxygen levels at night had dropped significantly and that the telemetry nurses were concerned. Dr. Corliss [name] asked the patient to contact his primary care physician about an urgent  sleep study.  Dr. Everlene [name], who treats him as his family doctor, quoted that the patient has periods of not breathing at night, awakening in the middle of the night because of shortness of breath, difficulties falling asleep once woken. He is not sure when the symptoms originally began. On July 30 [name] underwent embolization of a brain aneurysm without any complications. 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 The day after the procedure he was started on double antiplatelet therapy including Plavix and full size aspirin. He was asked to return in 14 days later to switch to [facility] was. It was during his hospitalization that his  pulse ox decreased dramatically in the nighttime. The patient has associated comorbidities that make him prone for sleep apnea, and make sleep apnea a risk factor for future strokes or heart attacks. The patient carries a diagnosis of diabetes mellitus type 2, diagnosed at age 53, hypercholesterolemia, atrial fibrillation, migraine and bipolar disease with depression. He also reported swelling in his legs, fluid water gain, and had periods of passing out, fainting. Increased thirst and joint pain.   Chief complaint according to patient : " I get low oxygen at night"  Sleep habits are as follows:He has a late sleep night usually goes to bed after midnight between 12:30 AM and 2:30 AM and awakens up to 4 times at night. He watches netflix before going to bed, his son is 49 and with him, and his son retreats to his won bedroom at about 10 PM.  The patient sleeps in a cool, quiet and dark bedroom- after the TV is off.  He wakes frequently being short of breath, wheezing, and in distress. He wakes up every hour, and he has nocturia times 5 each night. He sleeps on 3 pillows, non adjustable bed. No oxygen use. Sleeps prone and on his side. Wakes at 7 AM gets his son to school. Averages only 5 hours - sleep deprived.    Sleep medical history and family sleep history: has been sleeping as described above since his stroke 2015, loss of his  job.   family history of OSA, older sister on CPAP and o2, great grandson with sleep apnea.   Social history: The patient is disabled, not working and a single parent. Drove a truck. Smoking started at age 2- quit in 05-2014.  ETOH use: used to drink 3 beers a week, not longer, caffeine ; 2 cuos a day in AM, the rest of the day it's water.   Review of Systems: Out of a complete 14 system review, the patient complains of only the following symptoms, and all [name] reviewed systems are negative.  During the daytime he is excessively sleepy and endorsed today the Epworth  sleepiness score at 24 points, but the fatigue severity score only at 38 points. Depression : feels not depressed, anxiety - yes. Worries a lot.    Social History   Social History  . Marital status: Divorced    Spouse name: not together since 2007  . Number of children: 3  . Years of education: 10   Occupational History  . [employer]    airport    Social History Main Topics  . Smoking status: Former Smoker    Packs/day: 1.50    Years: 25.00    Types: Cigarettes    Start date: 01/15/1975    Quit date: 06/05/2014  . Smokeless tobacco: Never Used  . Alcohol use 3.0 oz/week    5 Cans of beer per week     Comment: none since April 2018  . Drug use: No  . Sexual activity: No     Comment: partner has had surgery to prevent pregnancy   [name] Topics Concern  . Not on file   Social History Narrative   Lives with his son and his mother.  His son has no contact with the son's mother, though the patient is not legally separated from her.  She has a history of drug use and has been in prison several times.    Family History  Problem Relation Age of Onset  . Heart disease Mother        s/p 3V CABG  . Cancer Father 10       lung cancer; +tobacco  . Stroke Father   . Cancer Maternal Grandmother   . Heart disease Maternal Grandmother   . Cancer Paternal Grandmother   . Lupus Sister   . Multiple sclerosis Sister   . Anemia Daughter     Past Medical History:  Diagnosis Date  . A-fib (HCC)   . Aneurysm (HCC)    brain  . Arthritis   . Atrial fibrillation (HCC)   . Bipolar disorder (HCC)    was on meds but was taken off 2 yrs ago and none since  . Burning pain    in both legs-seeing Dr.Sethi for this  . Cataracts, bilateral   . Childhood asthma    Last asthma attack at age 33; History of trach at 16 months (12/17/2016)  . Chronic kidney disease 2009  . Chronic lower back pain   . COPD (chronic obstructive pulmonary disease) (HCC) 06/2012   uses Spiriva and  Albuterol daily as needed  . Depression   . Family history of adverse reaction to anesthesia    "Mom and sister have PONV"  . GERD (gastroesophageal reflux disease)    was on Nexium;taking Omeprazole daily as needed  . H/O hiatal hernia   . Headache(784.0)    "q 2-3 weeks; daily last 2 wks" (12/17/2016)  . Hyperlipidemia  takes Ramipril daily  . Hypertension    takes Ramipril daily  . Joint pain   . Joint swelling   . Middle cerebral artery aneurysm    right  . Nocturia   . Numbness    both arms   . Pneumonia    hx of-2014  . PONV (postoperative nausea and vomiting)    Pt reports nausea only.  . Sleep apnea    hx (12/17/2016);denies  . Stroke [facility]) 2015   "drag left foot more since; have to wear glasses now" (12/17/2016)  . Type II diabetes mellitus (HCC)    takes Metformin daily  . Umbilical hernia   . Urinary frequency   . Wears dentures   . Wears glasses    and contact lenses   Attestation signed by Melene Plan, DO at 01/13/2017 3:21 PM  I have personally seen and examined the patient and discussed plan of care with the resident.  I was present for entire procedure.  I have reviewed the appropriate documentation on PMH/FH/Soc. History.  I have reviewed the documentation of the resident and agree.        EKG Interpretation  Date/Time:                  Tuesday January 12 2017 18:32:02 EDT Ventricular Rate:   110 PR Interval:                        QRS Duration:        109 QT Interval:                      373 QTC Calculation:    505 R Axis:                         -13 Text Interpretation:  Atrial fibrillation Abnormal R-wave progression, early transition Prolonged QT interval Otherwise no significant change Confirmed by Adela Lank MD, DANIEL [phone]) on 01/12/2017 6:42:25 PM       56 yo M With a chief complaint of symptomatic atrial fibrillation. Been going on for the past day. Patient is artery anticoagulated on eliquis.  The case was discussed with Dr. Sharyn Lull who  recommended cardioversion.  On my exam patient with a irregularly irregular heart rate well-appearing and nontoxic. Clear lung sounds. No noted edema.  Chads2vasc of 2, already on eliquis.  D/c home.      Past Surgical History:  Procedure Laterality Date  . ANEURYSM COILING  2015  . CARDIOVERSION N/A 12/18/2016   Procedure: CARDIOVERSION;  Surgeon: Orpah Cobb, MD;  Location: [facility] ENDOSCOPY;  Service: Cardiovascular;  Laterality: N/A;  . COLONOSCOPY    . EXCISIONAL HEMORRHOIDECTOMY  1980s   "soon after rectal OR"  . GANGLION CYST EXCISION Right   . IR 3D INDEPENDENT WKST  05/10/2017  . IR ANGIO INTRA EXTRACRAN SEL COM CAROTID INNOMINATE BILAT MOD SED  04/22/2017  . IR ANGIO INTRA EXTRACRAN SEL INTERNAL CAROTID UNI R MOD SED  05/10/2017  . IR ANGIO VERTEBRAL SEL SUBCLAVIAN INNOMINATE UNI L MOD SED  04/22/2017  . IR ANGIO VERTEBRAL SEL VERTEBRAL UNI R MOD SED  04/22/2017  . IR ANGIOGRAM FOLLOW UP STUDY  05/10/2017  . IR NEURO EACH ADD'L AFTER BASIC UNI RIGHT (MS)  05/10/2017  . IR RADIOLOGIST EVAL & MGMT  04/30/2017  . IR RADIOLOGIST EVAL & MGMT  06/15/2017  . IR TRANSCATH/EMBOLIZ  05/10/2017  . MULTIPLE TOOTH  EXTRACTIONS    . RADIOLOGY WITH ANESTHESIA N/A 08/08/2014   Procedure: RADIOLOGY WITH ANESTHESIA EMBOLIZATION;  Surgeon: Medication Radiologist, MD;  Location: [facility] OR;  Service: Radiology;  Laterality: N/A;  . RADIOLOGY WITH ANESTHESIA N/A 11/21/2014   Procedure: RADIOLOGY WITH ANESTHESIA;  Surgeon: Oneal GroutSanjeev K Deveshwar, MD;  Location: [facility] OR;  Service: Radiology;  Laterality: N/A;  . RADIOLOGY WITH ANESTHESIA N/A 02/27/2015   Procedure: RADIOLOGY WITH ANESTHESIA;  Surgeon: Julieanne CottonSanjeev Deveshwar, MD;  Location: [facility] OR;  Service: Radiology;  Laterality: N/A;  . RADIOLOGY WITH ANESTHESIA N/A 09/25/2015   Procedure: RADIOLOGY WITH ANESTHESIA;  Surgeon: Julieanne CottonSanjeev Deveshwar, MD;  Location: [facility] OR;  Service: Radiology;  Laterality: N/A;  . RADIOLOGY WITH ANESTHESIA N/A 05/10/2017   Procedure: EMBOLIZATION;   Surgeon: Julieanne Cottoneveshwar, Sanjeev, MD;  Location: [facility] OR;  Service: Radiology;  Laterality: N/A;  . RECTAL SURGERY  1980s   "tore it lifting heavy furniture; sewed it back together"  . SHOULDER ARTHROSCOPY WITH OPEN ROTATOR CUFF REPAIR Right 2016  . SHOULDER ARTHROSCOPY WITH ROTATOR CUFF REPAIR Left 1996  . SHOULDER OPEN ROTATOR CUFF REPAIR Left 1997   "took out 8inches of my collarbone"  . TEE WITHOUT CARDIOVERSION N/A 12/18/2016   Procedure: TRANSESOPHAGEAL ECHOCARDIOGRAM (TEE);  Surgeon: Orpah CobbAjay Kadakia, MD;  [facility];  Service: Cardiovascular;  Laterality: N/A;  . TRACHEOSTOMY  1964   at age 55 months old; for asthma  . TRACHEOSTOMY CLOSURE      Current Outpatient Prescriptions  Medication Sig Dispense Refill  . albuterol (PROVENTIL HFA;VENTOLIN HFA) 108 (90 BASE) MCG/ACT inhaler Inhale 2 puffs into the lungs every 6 (six) hours as needed for wheezing or shortness of breath.     [name]. amiodarone (PACERONE) 200 MG tablet Take 1 tablet (200 mg total) by mouth daily. 30 tablet 1  . aspirin EC 81 MG tablet Take 81 mg by mouth daily.     [name]. atorvastatin (LIPITOR) 20 MG tablet Take 1 tablet (20 mg total) by mouth daily at 6 PM. (Patient taking differently: Take 20 mg by mouth every evening. ) 30 tablet 0  . ELIQUIS 5 MG TABS tablet TK 1 T PO BID  3  . esomeprazole (NEXIUM) 40 MG capsule Take 40 mg by mouth at bedtime as needed (for acid reflux).     . gabapentin (NEURONTIN) 100 MG capsule TAKE 1 CAPSULE BY MOUTH THREE TIMES DAILY 270 capsule 3  . hydrochlorothiazide (HYDRODIURIL) 25 MG tablet Take 0.5 tablets (12.5 mg total) by mouth daily.    . metFORMIN (GLUCOPHAGE) 500 MG tablet Take 1 tablet (500 mg total) by mouth 2 (two) times daily with a meal. 60 tablet 0  . nitroGLYCERIN (NITROSTAT) 0.4 MG SL tablet Place 1 tablet (0.4 mg total) under the tongue every 5 (five) minutes x 3 doses as needed for chest pain. 25 tablet 1  . umeclidinium-vilanterol (ANORO ELLIPTA) 62.5-25 MCG/INH AEPB Inhale 1  puff into the lungs daily.      No current facility-administered medications for this visit.     Allergies as of 06/22/2017 - Review Complete 06/22/2017  Allergen Reaction Noted  . Tylox [oxycodone-acetaminophen] Hives, [name] (See Comments), and Rash 10/19/2012  . Adhesive [tape] Rash 11/20/2014  Chief Complaint: Patient was seen in consultation today for aneurysm  Supervising Physician: Julieanne Cottoneveshwar, Sanjeev  [facility]  History of Present Illness: [name] is a 56 y.o. 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 Since this time he has continued with intermittent headaches.   He underwent CT Angio Head/Neck 03/29/17 which showed: 1. The right carotid bifurcation stent is patent. 2. Slight decreased size of the cavernous internal carotid artery on the left may reflect some [name] limitation on the right. 3. 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 4. Left M1 segment and MCA bifurcation stent and aneurysm coils without evidence for residual or recurrent aneurysm or distal occlusion. 5. Stable mm right carotid bifurcation aneurysm. 6. 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  He underwent cerebral angiogram 04/22/17 which showed: 1.Obliterated previous Lt MCA aneurysm with patent adjacent stent. 2.Approx 4.13mm x 4.4 mm RT MCA bifurcation aneurysm 3.Approx 2.77mm x 1.104mm prox Lt ACA A 1 seg aneurysm  He presents today for cerebral angiogram with possible intervention.  Patient is aware degree of intervention will be based on findings.   He has been NPO.  He has held his Elliquis for 1 week.  Dr Fatima Sanger note from earlier this summer, 7-30- 2018 , stenting and  coiling.    Vitals: BP 135/75   Pulse (!) 56   Ht  (1.727 m)   Wt 257 lb (116.6 kg)   BMI 39.08 kg/m  Last Weight:  Wt Readings from Last 1 Encounters:  06/22/17 257 lb (116.6 kg)   ZOX:WRUE mass index is 39.08 kg/m.     Last Height:   Ht Readings from Last 1 Encounters:  06/22/17  (1.727 m)    Physical exam:  General: The patient is awake, alert and appears not in acute distress. The patient is well groomed. Head: Normocephalic, atraumatic. Neck is supple. Mallampati 4,  neck circumference: 19 . Nasal airflow congested , TMJ click :not evident . Retrognathia is not seen. Prognathia .  Cardiovascular:  Regular rate and rhythm , without  murmurs or carotid bruit, and without distended neck veins. Respiratory: Lungs are clear to auscultation. Skin:  Without evidence of edema, or rash Trunk: BMI is 39. The patient's posture is erect  Neurologic exam : The patient is awake and alert, oriented to place and time.   Memory subjective  described as intact.   MMSE - Mini Mental State Exam 08/29/2014  Orientation to time 5  Orientation to Place 5  Registration 3  Attention/ Calculation 5  Recall 2  Language- name 2 objects 2  Language- repeat 1  Language- follow 3 step command 3  Language- read & follow direction 1  Write a sentence 1  Copy design 1  Total score 29    Attention span & concentration ability appears normal.  Speech is fluent,  With dysphonia   Mood and affect are appropriate.  Cranial nerves: Pupils are equal and briskly reactive to light. Funduscopic exam without evidence of pallor or edema.  Extraocular movements  in vertical and horizontal planes intact and without nystagmus. Visual fields by finger perimetry are intact. Hearing to finger rub intact. Facial sensation intact to fine touch.  Facial motor strength is symmetric and tongue and uvula move midline. Shoulder shrug was symmetrical.   Motor exam: Normal tone, muscle bulk and symmetric  strength in all extremities. Sensory:  Fine touch, pinprick and vibration were tested in all extremities. Proprioception tested in the upper extremities was normal. Coordination: Rapid alternating movements in the fingers/hands was normal. Finger-to-nose  maneuver - without evidence of ataxia, dysmetria or tremor. Gait and station: Patient walks without assistive device . Deep tendon reflexes: in the  upper and lower extremities are symmetric .   Assessment:  After physical and neurologic examination, review of laboratory studies,  Personal review of imaging studies, reports of [name] /same  Imaging studies, results of polysomnography and / or neurophysiology testing and pre-existing records as far as provided in visit., my assessment is   1) I suspect that [name] will have a high degree of obstructive sleep apnea based on his airway shape, his body mass index, his neck circumference as well as the findings in the postop monitoring room. I also suspect that he has not just obstructive sleep apnea been overlapped the COPD. Comorbidities include diabetes mellitus which could contribute to his nocturia frequency, atrial fibrillation which can be a process initiated caused by obstructive sleep apnea. He also has been treated with pain medication, narcotic pain medications and these can reduce his respiratory drive and in some cases can create Cheyne-Stokes respiration or central apnea. I reviewed the patient's medication : at the time he is still on aspirin and started on Eloquis !  The patient was advised of the nature of the diagnosed disorder , the treatment options and the  risks for general health and wellness arising from not treating the condition.   I will order a urgent split-night polysomnography for this patient, my goal is to treat apnea is found. I would be very surprised if not found. I also encourage the sleep tach to use oxygen supplementation should CPAP or BiPAP not controlled hypoxemia  at night. This is an urgent treatment due to the upcoming surgery. His date for aneurysm treatment in interventional radiology was set for 24 of September 2018.   I spent more than 55 minutes of face to face time with the patient.  Greater than 50% of time was spent in counseling and coordination of care. We have discussed the diagnosis and differential and I answered the patient's questions.     Ivan Novas, MD 06/22/2017, 9:31 AM  Certified in Neurology by ABPN Certified in Sleep Medicine by [facility] [address] [place], [place] [address]

## 2017-06-24 ENCOUNTER — Other Ambulatory Visit (HOSPITAL_COMMUNITY): Payer: Self-pay | Admitting: Interventional Radiology

## 2017-06-24 DIAGNOSIS — I671 Cerebral aneurysm, nonruptured: Secondary | ICD-10-CM

## 2017-06-25 ENCOUNTER — Encounter (HOSPITAL_COMMUNITY): Payer: Self-pay | Admitting: *Deleted

## 2017-06-25 ENCOUNTER — Other Ambulatory Visit: Payer: Self-pay | Admitting: Radiology

## 2017-06-25 NOTE — Progress Notes (Signed)
Morrie Sheldon from Dr. Fatima Sanger office called me back and stated that Dr. Loni Beckwith said that not having the sleep study prior to procedure is not a problem. Have request last office visit note from Dr. Annitta Jersey office.

## 2017-06-25 NOTE — Progress Notes (Signed)
Spoke with pt for pre-op call. Pt has hx of A-fib, states he's not feeling like he's in it now. Denies chest pain or sob. Dr. Sharyn Lull is his cardiologist, states he was seen in office 3 weeks ago. Pt was referred to Dr. Richardean Chimera for possible sleep study, per her note Dr. [name] had requested this prior to pt's procedure. Pt's procedure has been moved up to 9/17, had been scheduled for 9/24. Pt has not had the sleep study done. I asked pt if Dr. Fatima Sanger office was aware of this. He stated that he told Victorino Dike at the office. Pt is a type 2 diabetic, last A1C was 5.2 on 03/26/17. Pt states his fasting blood sugar is usually around 108. Pt instructed not to take Metformin day of surgery. Instructed pt to check his blood sugar when he wakes up Monday AM. If blood sugar is 70 or below, treat with 1/2 cup of clear juice (apple or cranberry) and recheck blood sugar 15 minutes after drinking juice. Pt voiced understanding. I called Dr. Fatima Sanger office and spoke with Morrie Sheldon to make sure that they were aware pt has not had the sleep study done. She states she will check with Dr. [name] and will call me back.

## 2017-06-27 NOTE — Anesthesia Preprocedure Evaluation (Addendum)
Anesthesia Evaluation  Patient identified by MRN, date of birth, ID band Patient awake    Reviewed: Allergy & Precautions, NPO status , Patient's Chart, lab work & pertinent test results  History of Anesthesia Complications (+) PONV and history of anesthetic complications  Airway Mallampati: I   Neck ROM: full    Dental  (+) Edentulous Lower, Edentulous Upper   Pulmonary asthma , sleep apnea , COPD,  COPD inhaler, former smoker,    breath sounds clear to auscultation       Cardiovascular hypertension, + Peripheral Vascular Disease  + dysrhythmias Atrial Fibrillation  Rhythm:regular Rate:Normal     Neuro/Psych  Headaches, PSYCHIATRIC DISORDERS Depression Bipolar Disorder Intracranial aneurysm TIACVA, Residual Symptoms    GI/Hepatic Neg liver ROS, hiatal hernia, GERD  Controlled and Medicated,  Endo/Other  diabetes, Type obesity  Renal/GU      Musculoskeletal  (+) Arthritis ,   Abdominal   Peds  Hematology   Anesthesia Other Findings Chronic lower back pain, remote hx tracheostomy  Reproductive/Obstetrics                            Anesthesia Physical  Anesthesia Plan  ASA: III  Anesthesia Plan: General   Post-op Pain Management:    Induction: Intravenous  PONV Risk Score and Plan: 3 and Ondansetron, Dexamethasone, Midazolam, Propofol infusion and Treatment may vary due to age or medical condition  Airway Management Planned: Oral ETT  Additional Equipment: Arterial line  Intra-op Plan:   Post-operative Plan: Extubation in OR  Informed Consent: I have reviewed the patients History and Physical, chart, labs and discussed the procedure including the risks, benefits and alternatives for the proposed anesthesia with the patient or authorized representative who has indicated his/her understanding and acceptance.     Plan Discussed with: CRNA  Anesthesia Plan Comments:          Anesthesia Quick Evaluation

## 2017-06-28 ENCOUNTER — Encounter (HOSPITAL_COMMUNITY): Payer: Self-pay

## 2017-06-28 ENCOUNTER — Ambulatory Visit (HOSPITAL_COMMUNITY): Payer: Medicare Other | Admitting: Anesthesiology

## 2017-06-28 ENCOUNTER — Ambulatory Visit (HOSPITAL_COMMUNITY)
Admission: RE | Admit: 2017-06-28 | Discharge: 2017-06-28 | Disposition: A | Discharge status: Home / Self Care | Payer: Medicare Other | Source: Ambulatory Visit | Attending: Interventional Radiology | Admitting: Interventional Radiology

## 2017-06-28 ENCOUNTER — Inpatient Hospital Stay (HOSPITAL_COMMUNITY)
Admission: AD | Admit: 2017-06-28 | Discharge: 2017-06-29 | DRG: 027 | Disposition: A | Discharge status: Home / Self Care | Payer: Medicare Other | Source: Ambulatory Visit | Attending: Interventional Radiology | Admitting: Interventional Radiology

## 2017-06-28 ENCOUNTER — Encounter (HOSPITAL_COMMUNITY)
Admission: AD | Disposition: A | Discharge status: Home / Self Care | Payer: Self-pay | Source: Ambulatory Visit | Attending: Interventional Radiology

## 2017-06-28 DIAGNOSIS — Z8249 Family history of ischemic heart disease and other diseases of the circulatory system: Secondary | ICD-10-CM

## 2017-06-28 DIAGNOSIS — Z801 Family history of malignant neoplasm of trachea, bronchus and lung: Secondary | ICD-10-CM

## 2017-06-28 DIAGNOSIS — Z7902 Long term (current) use of antithrombotics/antiplatelets: Secondary | ICD-10-CM | POA: Diagnosis not present

## 2017-06-28 DIAGNOSIS — Z885 Allergy status to narcotic agent status: Secondary | ICD-10-CM

## 2017-06-28 DIAGNOSIS — M545 Low back pain: Secondary | ICD-10-CM | POA: Diagnosis present

## 2017-06-28 DIAGNOSIS — F319 Bipolar disorder, unspecified: Secondary | ICD-10-CM | POA: Diagnosis present

## 2017-06-28 DIAGNOSIS — Z823 Family history of stroke: Secondary | ICD-10-CM

## 2017-06-28 DIAGNOSIS — I4891 Unspecified atrial fibrillation: Secondary | ICD-10-CM | POA: Diagnosis present

## 2017-06-28 DIAGNOSIS — Z820 Family history of epilepsy and other diseases of the nervous system: Secondary | ICD-10-CM | POA: Diagnosis not present

## 2017-06-28 DIAGNOSIS — I10 Essential (primary) hypertension: Secondary | ICD-10-CM | POA: Diagnosis present

## 2017-06-28 DIAGNOSIS — Z91048 Other nonmedicinal substance allergy status: Secondary | ICD-10-CM | POA: Diagnosis not present

## 2017-06-28 DIAGNOSIS — E785 Hyperlipidemia, unspecified: Secondary | ICD-10-CM | POA: Diagnosis present

## 2017-06-28 DIAGNOSIS — Z832 Family history of diseases of the blood and blood-forming organs and certain disorders involving the immune mechanism: Secondary | ICD-10-CM

## 2017-06-28 DIAGNOSIS — Z7982 Long term (current) use of aspirin: Secondary | ICD-10-CM | POA: Diagnosis not present

## 2017-06-28 DIAGNOSIS — J449 Chronic obstructive pulmonary disease, unspecified: Secondary | ICD-10-CM | POA: Diagnosis present

## 2017-06-28 DIAGNOSIS — K219 Gastro-esophageal reflux disease without esophagitis: Secondary | ICD-10-CM | POA: Diagnosis present

## 2017-06-28 DIAGNOSIS — M1990 Unspecified osteoarthritis, unspecified site: Secondary | ICD-10-CM | POA: Diagnosis present

## 2017-06-28 DIAGNOSIS — I729 Aneurysm of unspecified site: Secondary | ICD-10-CM

## 2017-06-28 DIAGNOSIS — G4730 Sleep apnea, unspecified: Secondary | ICD-10-CM | POA: Diagnosis present

## 2017-06-28 DIAGNOSIS — Z7984 Long term (current) use of oral hypoglycemic drugs: Secondary | ICD-10-CM | POA: Diagnosis not present

## 2017-06-28 DIAGNOSIS — Z7901 Long term (current) use of anticoagulants: Secondary | ICD-10-CM | POA: Diagnosis not present

## 2017-06-28 DIAGNOSIS — E1151 Type 2 diabetes mellitus with diabetic peripheral angiopathy without gangrene: Secondary | ICD-10-CM | POA: Diagnosis present

## 2017-06-28 DIAGNOSIS — Z8673 Personal history of transient ischemic attack (TIA), and cerebral infarction without residual deficits: Secondary | ICD-10-CM

## 2017-06-28 DIAGNOSIS — I671 Cerebral aneurysm, nonruptured: Secondary | ICD-10-CM | POA: Diagnosis present

## 2017-06-28 DIAGNOSIS — K449 Diaphragmatic hernia without obstruction or gangrene: Secondary | ICD-10-CM | POA: Diagnosis present

## 2017-06-28 DIAGNOSIS — Z23 Encounter for immunization: Secondary | ICD-10-CM | POA: Diagnosis present

## 2017-06-28 DIAGNOSIS — Z87891 Personal history of nicotine dependence: Secondary | ICD-10-CM | POA: Diagnosis not present

## 2017-06-28 DIAGNOSIS — G8929 Other chronic pain: Secondary | ICD-10-CM | POA: Diagnosis present

## 2017-06-28 DIAGNOSIS — Z6839 Body mass index (BMI) 39.0-39.9, adult: Secondary | ICD-10-CM

## 2017-06-28 HISTORY — PX: IR 3D INDEPENDENT WKST: IMG2385

## 2017-06-28 HISTORY — PX: RADIOLOGY WITH ANESTHESIA: SHX6223

## 2017-06-28 HISTORY — PX: IR ANGIO VERTEBRAL SEL SUBCLAVIAN INNOMINATE UNI R MOD SED: IMG5365

## 2017-06-28 HISTORY — PX: IR NEURO EACH ADD'L AFTER BASIC UNI RIGHT (MS): IMG5374

## 2017-06-28 HISTORY — PX: IR TRANSCATH/EMBOLIZ: IMG695

## 2017-06-28 HISTORY — PX: IR ANGIOGRAM FOLLOW UP STUDY: IMG697

## 2017-06-28 HISTORY — PX: IR ANGIO INTRA EXTRACRAN SEL INTERNAL CAROTID UNI R MOD SED: IMG5362

## 2017-06-28 LAB — CBC WITH DIFFERENTIAL/PLATELET
Basophils Absolute: 0 10*3/uL (ref 0.0–0.1)
Basophils Relative: 0 %
Eosinophils Absolute: 0.1 10*3/uL (ref 0.0–0.7)
Eosinophils Relative: 2 %
HEMATOCRIT: 37.5 % — AB (ref 39.0–52.0)
HEMOGLOBIN: 12.5 g/dL — AB (ref 13.0–17.0)
LYMPHS ABS: 1.7 10*3/uL (ref 0.7–4.0)
LYMPHS PCT: 30 %
MCH: 29 pg (ref 26.0–34.0)
MCHC: 33.3 g/dL (ref 30.0–36.0)
MCV: 87 fL (ref 78.0–100.0)
Monocytes Absolute: 0.5 10*3/uL (ref 0.1–1.0)
Monocytes Relative: 8 %
NEUTROS PCT: 60 %
Neutro Abs: 3.5 10*3/uL (ref 1.7–7.7)
Platelets: 215 10*3/uL (ref 150–400)
RBC: 4.31 MIL/uL (ref 4.22–5.81)
RDW: 12.7 % (ref 11.5–15.5)
WBC: 5.8 10*3/uL (ref 4.0–10.5)

## 2017-06-28 LAB — GLUCOSE, CAPILLARY
GLUCOSE-CAPILLARY: 103 mg/dL — AB (ref 65–99)
GLUCOSE-CAPILLARY: 91 mg/dL (ref 65–99)
GLUCOSE-CAPILLARY: 133 mg/dL — AB (ref 65–99)
Glucose-Capillary: 110 mg/dL — ABNORMAL HIGH (ref 65–99)
Glucose-Capillary: 102 mg/dL — ABNORMAL HIGH (ref 65–99)

## 2017-06-28 LAB — COMPREHENSIVE METABOLIC PANEL
ALK PHOS: 51 U/L (ref 38–126)
ALT: 21 U/L (ref 17–63)
AST: 18 U/L (ref 15–41)
Albumin: 3.7 g/dL (ref 3.5–5.0)
Anion gap: 7 (ref 5–15)
BUN: 11 mg/dL (ref 6–20)
CALCIUM: 8.8 mg/dL — AB (ref 8.9–10.3)
CO2: 25 mmol/L (ref 22–32)
CREATININE: 0.96 mg/dL (ref 0.61–1.24)
Chloride: 106 mmol/L (ref 101–111)
Glucose, Bld: 104 mg/dL — ABNORMAL HIGH (ref 65–99)
Potassium: 3.6 mmol/L (ref 3.5–5.1)
Sodium: 138 mmol/L (ref 135–145)
Total Bilirubin: 0.5 mg/dL (ref 0.3–1.2)
Total Protein: 6.2 g/dL — ABNORMAL LOW (ref 6.5–8.1)

## 2017-06-28 LAB — POCT ACTIVATED CLOTTING TIME
ACTIVATED CLOTTING TIME: 186 s
Activated Clotting Time: 169 seconds
Activated Clotting Time: 169 seconds

## 2017-06-28 LAB — HEPARIN LEVEL (UNFRACTIONATED): HEPARIN UNFRACTIONATED: 0.12 [IU]/mL — AB (ref 0.30–0.70)

## 2017-06-28 LAB — PLATELET INHIBITION P2Y12: Platelet Function  P2Y12: 116 [PRU] — ABNORMAL LOW (ref 194–418)

## 2017-06-28 LAB — PROTIME-INR
INR: 0.96
Prothrombin Time: 12.7 seconds (ref 11.4–15.2)

## 2017-06-28 LAB — APTT: aPTT: 28 seconds (ref 24–36)

## 2017-06-28 SURGERY — RADIOLOGY WITH ANESTHESIA
Anesthesia: General

## 2017-06-28 MED ORDER — ATORVASTATIN CALCIUM 20 MG PO TABS
20.0000 mg | ORAL_TABLET | Freq: Every day | ORAL | Status: DC
  Administered 2017-06-28: 20 mg via ORAL
  Filled 2017-06-28: qty 1

## 2017-06-28 MED ORDER — NIMODIPINE 30 MG PO CAPS
ORAL_CAPSULE | ORAL | Status: AC
  Administered 2017-06-28: 30 mg via ORAL
  Filled 2017-06-28: qty 1

## 2017-06-28 MED ORDER — ASPIRIN EC 325 MG PO TBEC: 325.0000 mg | DELAYED_RELEASE_TABLET | ORAL | Status: DC

## 2017-06-28 MED ORDER — METFORMIN HCL 500 MG PO TABS: 500.0000 mg | ORAL_TABLET | Freq: Two times a day (BID) | ORAL | Status: DC

## 2017-06-28 MED ORDER — EPTIFIBATIDE 20 MG/10ML IV SOLN
INTRAVENOUS | Status: AC | PRN
  Administered 2017-06-28 (×3): 1.5 mg via INTRAVENOUS

## 2017-06-28 MED ORDER — SODIUM CHLORIDE 0.9 % IV SOLN: INTRAVENOUS | Status: DC

## 2017-06-28 MED ORDER — EPHEDRINE SULFATE 50 MG/ML IJ SOLN
INTRAMUSCULAR | Status: DC | PRN
  Administered 2017-06-28 (×2): 5 mg via INTRAVENOUS

## 2017-06-28 MED ORDER — ROCURONIUM BROMIDE 100 MG/10ML IV SOLN
INTRAVENOUS | Status: DC | PRN
  Administered 2017-06-28: 50 mg via INTRAVENOUS

## 2017-06-28 MED ORDER — NITROGLYCERIN 0.4 MG SL SUBL: 0.4000 mg | SUBLINGUAL_TABLET | SUBLINGUAL | Status: DC | PRN

## 2017-06-28 MED ORDER — HYDROCHLOROTHIAZIDE 25 MG PO TABS
25.0000 mg | ORAL_TABLET | Freq: Every day | ORAL | Status: DC
  Administered 2017-06-29: 25 mg via ORAL
  Filled 2017-06-28: qty 1

## 2017-06-28 MED ORDER — CLOPIDOGREL BISULFATE 75 MG PO TABS: 75.0000 mg | ORAL_TABLET | ORAL | Status: DC

## 2017-06-28 MED ORDER — HEPARIN (PORCINE) IN NACL 100-0.45 UNIT/ML-% IJ SOLN
900.0000 [IU]/h | INTRAMUSCULAR | Status: DC
  Administered 2017-06-28: 900 [IU]/h via INTRAVENOUS
  Filled 2017-06-28: qty 250

## 2017-06-28 MED ORDER — NITROGLYCERIN 1 MG/10 ML FOR IR/CATH LAB
INTRA_ARTERIAL | Status: AC
  Filled 2017-06-28: qty 10

## 2017-06-28 MED ORDER — SODIUM CHLORIDE 0.9 % IV SOLN
INTRAVENOUS | Status: DC
  Administered 2017-06-28 – 2017-06-29 (×2): via INTRAVENOUS

## 2017-06-28 MED ORDER — INFLUENZA VAC SPLIT QUAD 0.5 ML IM SUSY
0.5000 mL | PREFILLED_SYRINGE | INTRAMUSCULAR | Status: AC
  Administered 2017-06-29: 0.5 mL via INTRAMUSCULAR
  Filled 2017-06-28: qty 0.5

## 2017-06-28 MED ORDER — IOPAMIDOL (ISOVUE-300) INJECTION 61%
INTRAVENOUS | Status: AC
  Administered 2017-06-28: 50 mL
  Filled 2017-06-28: qty 100

## 2017-06-28 MED ORDER — LACTATED RINGERS IV SOLN
INTRAVENOUS | Status: DC
  Administered 2017-06-28 (×2): via INTRAVENOUS

## 2017-06-28 MED ORDER — PHENYLEPHRINE HCL 10 MG/ML IJ SOLN
INTRAVENOUS | Status: DC | PRN
  Administered 2017-06-28: 20 ug/min via INTRAVENOUS

## 2017-06-28 MED ORDER — CEFAZOLIN SODIUM-DEXTROSE 2-4 GM/100ML-% IV SOLN
INTRAVENOUS | Status: AC
  Filled 2017-06-28: qty 100

## 2017-06-28 MED ORDER — FENTANYL CITRATE (PF) 100 MCG/2ML IJ SOLN
INTRAMUSCULAR | Status: DC | PRN
  Administered 2017-06-28 (×2): 50 ug via INTRAVENOUS
  Administered 2017-06-28: 100 ug via INTRAVENOUS

## 2017-06-28 MED ORDER — AMIODARONE HCL 200 MG PO TABS
200.0000 mg | ORAL_TABLET | Freq: Every day | ORAL | Status: DC
  Administered 2017-06-29: 200 mg via ORAL
  Filled 2017-06-28: qty 1

## 2017-06-28 MED ORDER — ASPIRIN 81 MG PO CHEW
CHEWABLE_TABLET | ORAL | Status: AC
  Administered 2017-06-28: 162 mg via ORAL
  Filled 2017-06-28: qty 2

## 2017-06-28 MED ORDER — PROPOFOL 10 MG/ML IV BOLUS
INTRAVENOUS | Status: DC | PRN
  Administered 2017-06-28: 150 mg via INTRAVENOUS

## 2017-06-28 MED ORDER — FENTANYL CITRATE (PF) 100 MCG/2ML IJ SOLN: 25.0000 ug | INTRAMUSCULAR | Status: DC | PRN

## 2017-06-28 MED ORDER — IOPAMIDOL (ISOVUE-300) INJECTION 61%
INTRAVENOUS | Status: AC
  Administered 2017-06-28: 50 mL
  Filled 2017-06-28: qty 150

## 2017-06-28 MED ORDER — NIMODIPINE 30 MG PO CAPS: 0.0000 mg | ORAL_CAPSULE | ORAL | Status: DC

## 2017-06-28 MED ORDER — CLOPIDOGREL BISULFATE 75 MG PO TABS
75.0000 mg | ORAL_TABLET | Freq: Every day | ORAL | Status: DC
  Administered 2017-06-29: 75 mg via ORAL
  Filled 2017-06-28: qty 1

## 2017-06-28 MED ORDER — GABAPENTIN 100 MG PO CAPS
100.0000 mg | ORAL_CAPSULE | Freq: Three times a day (TID) | ORAL | Status: DC
  Administered 2017-06-28 – 2017-06-29 (×2): 100 mg via ORAL
  Filled 2017-06-28 (×2): qty 1

## 2017-06-28 MED ORDER — HEPARIN SODIUM (PORCINE) 1000 UNIT/ML IJ SOLN
INTRAMUSCULAR | Status: DC | PRN
  Administered 2017-06-28: 500 [IU] via INTRAVENOUS
  Administered 2017-06-28 (×2): 1000 [IU] via INTRAVENOUS
  Administered 2017-06-28: 3000 [IU] via INTRAVENOUS

## 2017-06-28 MED ORDER — IOPAMIDOL (ISOVUE-300) INJECTION 61%
INTRAVENOUS | Status: AC
  Administered 2017-06-28: 100 mL
  Filled 2017-06-28: qty 150

## 2017-06-28 MED ORDER — CEFAZOLIN SODIUM-DEXTROSE 2-4 GM/100ML-% IV SOLN
2.0000 g | INTRAVENOUS | Status: AC
  Administered 2017-06-28: 2 g via INTRAVENOUS

## 2017-06-28 MED ORDER — SUGAMMADEX SODIUM 500 MG/5ML IV SOLN
INTRAVENOUS | Status: DC | PRN
  Administered 2017-06-28: 300 mg via INTRAVENOUS

## 2017-06-28 MED ORDER — EPTIFIBATIDE 20 MG/10ML IV SOLN
INTRAVENOUS | Status: AC
  Filled 2017-06-28: qty 10

## 2017-06-28 MED ORDER — ASPIRIN 325 MG PO TABS
325.0000 mg | ORAL_TABLET | Freq: Every day | ORAL | Status: DC
  Administered 2017-06-29: 325 mg via ORAL
  Filled 2017-06-28: qty 1

## 2017-06-28 MED ORDER — NICARDIPINE HCL IN NACL 20-0.86 MG/200ML-% IV SOLN: 0.0000 mg/h | INTRAVENOUS | Status: DC

## 2017-06-28 MED ORDER — UMECLIDINIUM-VILANTEROL 62.5-25 MCG/INH IN AEPB
1.0000 | INHALATION_SPRAY | Freq: Every day | RESPIRATORY_TRACT | Status: DC
  Administered 2017-06-29: 12:00:00 1 via RESPIRATORY_TRACT
  Filled 2017-06-28: qty 14

## 2017-06-28 MED ORDER — ONDANSETRON HCL 4 MG/2ML IJ SOLN
4.0000 mg | Freq: Four times a day (QID) | INTRAMUSCULAR | Status: DC | PRN
Start: 2017-06-28 — End: 2017-06-29

## 2017-06-28 MED ORDER — HEPARIN (PORCINE) IN NACL 100-0.45 UNIT/ML-% IJ SOLN
INTRAMUSCULAR | Status: AC
  Filled 2017-06-28: qty 250

## 2017-06-28 MED ORDER — PANTOPRAZOLE SODIUM 40 MG PO TBEC
40.0000 mg | DELAYED_RELEASE_TABLET | Freq: Every evening | ORAL | Status: DC | PRN
Start: 2017-06-28 — End: 2017-06-29

## 2017-06-28 MED ORDER — INSULIN ASPART 100 UNIT/ML ~~LOC~~ SOLN: 0.0000 [IU] | Freq: Three times a day (TID) | SUBCUTANEOUS | Status: DC

## 2017-06-28 MED ORDER — ONDANSETRON HCL 4 MG/2ML IJ SOLN
INTRAMUSCULAR | Status: DC | PRN
  Administered 2017-06-28: 4 mg via INTRAVENOUS

## 2017-06-28 MED ORDER — LABETALOL HCL 5 MG/ML IV SOLN
INTRAVENOUS | Status: DC | PRN
  Administered 2017-06-28: 10 mg via INTRAVENOUS

## 2017-06-28 MED ORDER — LIDOCAINE HCL (CARDIAC) 20 MG/ML IV SOLN
INTRAVENOUS | Status: DC | PRN
  Administered 2017-06-28: 60 mg via INTRAVENOUS

## 2017-06-28 MED ORDER — PROMETHAZINE HCL 25 MG/ML IJ SOLN: 6.2500 mg | INTRAMUSCULAR | Status: DC | PRN

## 2017-06-28 NOTE — Progress Notes (Signed)
Patient vascular site level 0 on arrival. Patient is able to sit up as of 1530 prior to arrival on unit. No complaint of pain, tolerating fluids, was given roast beef in PACU with no complications. Diet has been advanced to carb mod per order. Daughter is a the bedside.

## 2017-06-28 NOTE — H&P (Deleted)
  The note originally documented on this encounter has been moved the the encounter in which it belongs.  

## 2017-06-28 NOTE — Progress Notes (Signed)
eLink Physician-Brief Progress Note Patient Name: [name] DOB: [birth year] MRN: [id number]   Date of Service  06/28/2017  HPI/Events of Note  56 yo male s/p stent placement for R MCA aneurysm by IR. Extubated. VSS. No new orders.   eICU Interventions  Continue present management.     Intervention Category Evaluation Type: New Patient Evaluation  Lenell Antu 06/28/2017, 6:55 PM

## 2017-06-28 NOTE — Sedation Documentation (Signed)
6 Fr. Exoseal to right groin. 

## 2017-06-28 NOTE — Transfer of Care (Signed)
Immediate Anesthesia Transfer of Care Note  Patient: [name]  Procedure(s) Performed: Procedure(s): RADIOLOGY WITH ANESTHESIA-EMBOLIZATION (N/A)  Patient Location: PACU  Anesthesia Type:General  Level of Consciousness: awake, alert , oriented and patient cooperative  Airway & Oxygen Therapy: Patient Spontanous Breathing and Patient connected to nasal cannula oxygen  Post-op Assessment: Report given to RN, Post -op Vital signs reviewed and stable and Patient moving all extremities  Post vital signs: Reviewed and stable  Last Vitals:  Vitals:   06/28/17 0618  BP: 121/63  Pulse: (!) 51  Resp: 18  Temp: 36.4 C  SpO2: 93%    Last Pain:  Vitals:   06/28/17 0618  TempSrc: Oral         Complications: No apparent anesthesia complications

## 2017-06-28 NOTE — Anesthesia Procedure Notes (Signed)
Procedure Name: Intubation Date/Time: 06/28/2017 9:07 AM Performed by: Rebekah Chesterfield L Pre-anesthesia Checklist: Patient identified, Emergency Drugs available, Suction available and Patient being monitored Patient Re-evaluated:Patient Re-evaluated prior to induction Oxygen Delivery Method: Circle System Utilized Preoxygenation: Pre-oxygenation with 100% oxygen Induction Type: IV induction Ventilation: Mask ventilation without difficulty and Oral airway inserted - appropriate to patient size Laryngoscope Size: Mac and 4 Grade View: Grade I Tube type: Oral Tube size: 8.0 mm Number of attempts: 1 Airway Equipment and Method: Stylet and Oral airway Placement Confirmation: ETT inserted through vocal cords under direct vision,  positive ETCO2 and breath sounds checked- equal and bilateral Secured at: 22 cm Tube secured with: Tape Dental Injury: Teeth and Oropharynx as per pre-operative assessment

## 2017-06-28 NOTE — Progress Notes (Addendum)
ANTICOAGULATION CONSULT NOTE  Pharmacy Consult for heparin Indication: post-interventional neuroradiological procedure  Heparin Dosing Weight: 94.8 kg   Assessment: 56 yom with cerebral artery aneurysm. Pharmacy consulted to dose heparin post-IR neuroradiological procedure. Heparin currently running at 500 units/hr post-op. Hg 12.5, plt wnl. No bleeding or IV line issues per RN.  Goal of Therapy:  Heparin level 0.1-0.25 units/ml Monitor platelets by anticoagulation protocol: Yes   Plan:  Increase heparin to 900 units/hr 6h heparin level CBC in AM Monitor for s/sx bleeding Heparin off at 0700 per protocol   Babs Bertin, PharmD, BCPS Clinical Pharmacist 06/28/2017 12:37 PM

## 2017-06-28 NOTE — Progress Notes (Signed)
Patient ID: [name], male   DOB: 1961/04/08, 56 y.o.   MRN: [id number] INR. Post procedure  Extubated without difficulty. PaO2 > 95 % . BP 130s/70s/ ,HR 46 to  50s SR. Denies H/A,N/V or visual symptoms. [name] Speech  And comprehension WNLs. Moves all 4s equally. No pronation drift . Rt groin soft .No hematoma. Pulses DPs 2+ and PTs dopplerable  No change.  S.Efraim Vanallen MD

## 2017-06-28 NOTE — H&P (Signed)
Chief Complaint: Patient was seen in consultation today for right middle cerebral artery aneurysm embolization at the request of  Dr Lina Sayre   Supervising Physician: [name]  Patient Status: [facility] - Out-pt  History of Present Illness: [name] is a 56 y.o. 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 A-fib (HCC)   . Aneurysm (HCC)    brain  . Arthritis   . Atrial fibrillation (HCC)   . Bipolar disorder (HCC)    was on meds but was taken off 2 yrs ago and none since  . Burning pain    in both legs-seeing Dr.Sethi for this  . Cataracts, bilateral   . Childhood asthma    Last asthma attack at age 82; History of trach at 16 months (12/17/2016)  . Chronic kidney disease 2009  . Chronic lower back pain   . COPD (chronic obstructive pulmonary disease) (HCC) 06/2012   uses Spiriva and Albuterol daily as needed  . Depression   . Family history of adverse reaction to anesthesia    "Mom and sister have PONV"  . GERD (gastroesophageal reflux disease)   . H/O hiatal hernia   . Headache(784.0)    "q 2-3 weeks; daily last 2 wks" (12/17/2016)  . Hyperlipidemia    takes Ramipril daily  . Hypertension    takes Ramipril daily  . Joint pain   . Joint swelling   . Middle cerebral artery aneurysm    right  . Nocturia   . Numbness    both arms   . Pneumonia    hx of-2014  . PONV (postoperative nausea and vomiting)    Pt reports nausea only.  . Sleep apnea    hx (12/17/2016);denies  Has upcoming sleep study per Dr. Lewie Loron office note of 06/22/17  .  Stroke [facility]) 2015   "drag left foot more since; have to wear glasses now" (12/17/2016)  . Type II diabetes mellitus (HCC)    takes Metformin daily  . Umbilical hernia   . Urinary frequency   . Wears dentures   . Wears glasses    and contact lenses    Past Surgical History:  Procedure Laterality Date  . ANEURYSM COILING  2015  . CARDIOVERSION N/A 12/18/2016   Procedure: CARDIOVERSION;  Surgeon: Orpah Cobb, MD;  Location: [facility] ENDOSCOPY;  Service: Cardiovascular;  Laterality: N/A;  . COLONOSCOPY    . EXCISIONAL HEMORRHOIDECTOMY  1980s   "soon after rectal OR"  . GANGLION CYST EXCISION Right   . IR 3D INDEPENDENT WKST  05/10/2017  . IR ANGIO INTRA EXTRACRAN SEL COM CAROTID INNOMINATE BILAT MOD SED  04/22/2017  . IR ANGIO INTRA EXTRACRAN SEL INTERNAL CAROTID UNI R MOD SED  05/10/2017  . IR ANGIO VERTEBRAL SEL SUBCLAVIAN INNOMINATE UNI L MOD SED  04/22/2017  . IR ANGIO VERTEBRAL SEL VERTEBRAL UNI R MOD SED  04/22/2017  . IR ANGIOGRAM FOLLOW UP STUDY  05/10/2017  . IR NEURO EACH ADD'L AFTER BASIC UNI RIGHT (MS)  05/10/2017  . IR RADIOLOGIST EVAL & MGMT  04/30/2017  . IR  RADIOLOGIST EVAL & MGMT  06/15/2017  . IR TRANSCATH/EMBOLIZ  05/10/2017  . MULTIPLE TOOTH EXTRACTIONS    . RADIOLOGY WITH ANESTHESIA N/A 08/08/2014   Procedure: RADIOLOGY WITH ANESTHESIA EMBOLIZATION;  Surgeon: Medication Radiologist, MD;  Location: [facility] OR;  Service: Radiology;  Laterality: N/A;  . RADIOLOGY WITH ANESTHESIA N/A 11/21/2014   Procedure: RADIOLOGY WITH ANESTHESIA;  Surgeon: Oneal Grout, MD;  Location: [facility] OR;  Service: Radiology;  Laterality: N/A;  . RADIOLOGY WITH ANESTHESIA N/A 02/27/2015   Procedure: RADIOLOGY WITH ANESTHESIA;  Surgeon: [name], MD;  Location: [facility] OR;  Service: Radiology;  Laterality: N/A;  . RADIOLOGY WITH ANESTHESIA N/A 09/25/2015   Procedure: RADIOLOGY WITH ANESTHESIA;  Surgeon: [name], MD;  Location: [facility] OR;  Service: Radiology;  Laterality: N/A;  . RADIOLOGY WITH  ANESTHESIA N/A 05/10/2017   Procedure: EMBOLIZATION;  Surgeon: [name], MD;  Location: [facility] OR;  Service: Radiology;  Laterality: N/A;  . RECTAL SURGERY  1980s   "tore it lifting heavy furniture; sewed it back together"  . SHOULDER ARTHROSCOPY WITH OPEN ROTATOR CUFF REPAIR Right 2016  . SHOULDER ARTHROSCOPY WITH ROTATOR CUFF REPAIR Left 1996  . SHOULDER OPEN ROTATOR CUFF REPAIR Left 1997   "took out 8inches of my collarbone"  . TEE WITHOUT CARDIOVERSION N/A 12/18/2016   Procedure: TRANSESOPHAGEAL ECHOCARDIOGRAM (TEE);  Surgeon: Orpah Cobb, MD;  Location: [facility] ENDOSCOPY;  Service: Cardiovascular;  Laterality: N/A;  . TRACHEOSTOMY  1964   at age 36 months old; for asthma  . TRACHEOSTOMY CLOSURE      Allergies: Tylox [oxycodone-acetaminophen] and Adhesive [tape]  Medications: Prior to Admission medications   Medication Sig Start Date End Date Taking? Authorizing Provider  albuterol (PROVENTIL HFA;VENTOLIN HFA) 108 (90 BASE) MCG/ACT inhaler Inhale 2 puffs into the lungs every 6 (six) hours as needed for wheezing or shortness of breath.     [provider]  amiodarone (PACERONE) 200 MG tablet Take 1 tablet (200 mg total) by mouth daily. 12/20/16   Orpah Cobb, MD  apixaban (ELIQUIS) 5 MG TABS tablet Take 5 mg by mouth 2 (two) times daily.    [provider]  aspirin EC 81 MG tablet Take 81 mg by mouth daily.     [provider]  atorvastatin (LIPITOR) 20 MG tablet Take 1 tablet (20 mg total) by mouth daily at 6 PM. Patient taking differently: Take 20 mg by mouth every evening.  07/06/14   Calvert Cantor, MD  clopidogrel (PLAVIX) 75 MG tablet Take 75 mg by mouth daily.    [provider]  esomeprazole (NEXIUM) 40 MG capsule Take 40 mg by mouth at bedtime as needed (for acid reflux).     [provider]  gabapentin (NEURONTIN) 100 MG capsule TAKE 1 CAPSULE BY MOUTH THREE TIMES DAILY 08/25/15   Marvel Plan, MD  hydrochlorothiazide (HYDRODIURIL)  25 MG tablet Take 0.5 tablets (12.5 mg total) by mouth daily. 12/19/16   Orpah Cobb, MD  metFORMIN (GLUCOPHAGE) 500 MG tablet Take 1 tablet (500 mg total) by mouth 2 (two) times daily with a meal. 07/06/14   Calvert Cantor, MD  nitroGLYCERIN (NITROSTAT) 0.4 MG SL tablet Place 1 tablet (0.4 mg total) under the tongue every 5 (five) minutes x 3 doses as needed for chest pain. 07/08/14   Orpah Cobb, MD  umeclidinium-vilanterol (ANORO ELLIPTA) 62.5-25 MCG/INH AEPB Inhale 1 puff into the lungs daily.     [provider]     Family History  Problem Relation Age of Onset  .  Heart disease Mother        s/p 3V CABG  . Cancer Father 55       lung cancer; +tobacco  . Stroke Father   . Cancer Maternal Grandmother   . Heart disease Maternal Grandmother   . Cancer Paternal Grandmother   . Lupus Sister   . Multiple sclerosis Sister   . Anemia Daughter     Social History   Social History  . Marital status: Divorced    Spouse name: not together since 2007  . Number of children: 3  . Years of education: 10   Occupational History  . [employer]    airport    Social History Main Topics  . Smoking status: Former Smoker    Packs/day: 1.50    Years: 25.00    Types: Cigarettes    Start date: 01/15/1975    Quit date: 06/05/2014  . Smokeless tobacco: Never Used  . Alcohol use 3.0 oz/week    5 Cans of beer per week     Comment: none since Jun 10, 2017  . Drug use: No  . Sexual activity: No     Comment: partner has had surgery to prevent pregnancy   Other Topics Concern  . None   Social History Narrative   Lives with his son and his mother.  His son has no contact with the son's mother, though the patient is not legally separated from her.  She has a history of drug use and has been in prison several times.    Review of Systems: A 12 point ROS discussed and pertinent positives are indicated in the HPI above.  All other systems are negative.  Review of Systems    Constitutional: Negative for activity change, appetite change, fatigue and fever.  HENT: Negative for tinnitus and voice change.   Eyes: Negative for visual disturbance.  Respiratory: Negative for cough and shortness of breath.   Cardiovascular: Negative for chest pain.  Gastrointestinal: Negative for abdominal pain and nausea.  Musculoskeletal: Negative for gait problem.  Neurological: Negative for dizziness, tremors, seizures, syncope, facial asymmetry, speech difficulty, weakness, light-headedness, numbness and headaches.  Psychiatric/Behavioral: Negative for behavioral problems and confusion.    Vital Signs: There were no vitals taken for this visit.  Physical Exam  Constitutional: He is oriented to person, place, and time. He appears well-nourished.  HENT:  Head: Atraumatic.  Eyes: EOM are normal.  Neck: Neck supple.  Cardiovascular: Normal rate, regular rhythm and normal heart sounds.   Pulmonary/Chest: Effort normal and breath sounds normal.  Abdominal: Soft. Bowel sounds are normal.  Musculoskeletal: Normal range of motion.  Neurological: He is alert and oriented to person, place, and time.  Skin: Skin is warm and dry.  Psychiatric: He has a normal mood and affect. His behavior is normal. Judgment and thought content normal.  Nursing note and vitals reviewed.   Imaging: Ir Radiologist Eval & Mgmt  Result Date: 06/18/2017 EXAM: ESTABLISHED PATIENT OFFICE VISIT CHIEF COMPLAINT: Recurrent headaches. Stent. Current Pain Level: 1-10 HISTORY OF PRESENT ILLNESS: The patient is a 56 year old right-handed gentleman who returns in follow-up to discuss treatment options of right-sided anterior circulation intracranial aneurysm. The patient is known to have multiple intracranial aneurysms. His most recent history of cerebral arteriogram was from 05/10/2017 when staged embolization of the right middle cerebral artery bifurcation aneurysm was performed by placing a LVIS Jr stent device  across the neck of the wide neck aneurysm and into the inferior division  of the right middle cerebral artery. He had previously undergone endovascular stent assisted treatment of a left middle cerebral artery bifurcation aneurysm with complete obliteration. 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 Apart from recent headaches, the patient reports no other clinical features such as weakness in extremities, facial drooping, speech difficulties, visual problems, incoordination or station and gait difficulties. His review of systems reveal symptoms of chronic constipation, back pain, neck pain without other associated features. Patient does have a history asthma resulting in shortness of breath. Patient also reported episodes of falling asleep during the daytime. Past medical history, surgical history and family history as per previously on the chart. His present medications: Albuterol inhaler every 6 hours 2 puffs as needed, amiodarone, Anoro Ellipta, aspirin 81 mg, atorvastatin 20 mg, Plavix 75 mg, omeprazole, gabapentin, hydrochlorothiazide, metformin, nitroglycerin. Allergies:  Tylox and adhesive tape. Social History: Per patient denies smoking cigarettes or using alcohol. Denies using illicit chemicals. PHYSICAL EXAMINATION: Grossly unremarkable. Affect appropriate to the situation. Neurologically intact, otherwise. ASSESSMENT AND PLAN: The patient's diagnostic catheter arteriogram of 05/10/2017 was reviewed. Again demonstrated was the wide neck aneurysm at the right middle cerebral artery bifurcation with the previously positioned stent across the wide neck. The second-stage treatment of the aneurysm would entail either primary coiling or stent assisted coiling with a Y stent configuration or placement just of another device across the superior division of the left middle cerebral artery. The procedure, the risks of 1-2% of a stroke, remote possibility of a intraprocedural  rupture with potential for emergent surgery and fatality were all reviewed. Questions were answered to the patient's satisfaction. The patient has been asked to start taking his Eliquis. He will be instructed to stop this approximately 5 days prior to the scheduled procedure under general anesthesia. The patient will then be switched back to Plavix. The patient has been asked to continue taking his aspirin as is. The patient reports understanding of this management plan. He will call should he have any concerns or questions. Should he develop new symptoms in the interim, such as weakness, speech difficulties, incoordination, the patient has been asked to call [phone]. Electronically Signed   By: [name] M.D.   On: 06/17/2017 18:29    Labs:  CBC:  Recent Labs  04/22/17 0650 05/05/17 1342 05/11/17 0435 06/28/17 0646  WBC 6.6 6.5 10.4 5.8  HGB 13.5 13.5 12.1* 12.5*  HCT 40.5 39.9 36.1* 37.5*  PLT 224 222 206 215    COAGS:  Recent Labs  12/18/16 1018 04/22/17 0650 05/05/17 1342 06/28/17 0646 06/28/17 0741  INR 1.02 0.99 0.93  --  0.96  APTT  --  --     BMP:  Recent Labs  04/22/17 0650 05/05/17 1342 05/11/17 0435 06/28/17 0646  NA 138 138 136 138  K 3.6 3.6 3.7 3.6  CL 102 102 103 106  CO2 GLUCOSE 113* 103* 146* 104*  BUN CALCIUM 9.0 9.3 8.2* 8.8*  CREATININE 0.96 1.04 0.77 0.96  GFRNONAA >60 >60 >60 >60  GFRAA >60 >60 >60 >60    LIVER FUNCTION TESTS:  Recent Labs  10/19/16 0824 12/17/16 1817 03/30/17 1945 06/28/17 0646  BILITOT 0.5 0.5 0.4 0.5  AST ALT ALKPHOS 51 52 63 51  PROT 6.5 6.3* 7.1 6.2*  ALBUMIN 3.9 3.4* 4.4 3.7    TUMOR  MARKERS: No results for input(s): AFPTM, CEA, CA199, CHROMGRNA in the last 8760 hours.  Assessment and Plan:  Known intracranial aneurysms Staged embolization R MCA aneurysm Initial LVIS stent placed 7/30./2018 Now scheduled for additional  embolization Risks and benefits of cerebral arteriogram and R MCA aneurysm embolization were discussed with the patient including, but not limited to bleeding, infection, vascular injury, contrast induced renal failure, stroke or even death. This interventional procedure involves the use of X-rays and because of the nature of the planned procedure, it is possible that we will have prolonged use of X-ray fluoroscopy. Potential radiation risks to you include (but are not limited to) the following: - A slightly elevated risk for cancer  several years later in life. This risk is typically less than 0.5% percent. This risk is low in comparison to the normal incidence of human cancer, which is 33% for women and 50% for men according to the American Cancer Society. - Radiation induced injury can include skin redness, resembling a rash, tissue breakdown / ulcers and hair loss (which can be temporary or permanent).  The likelihood of either of these occurring depends on the difficulty of the procedure and whether you are sensitive to radiation due to previous procedures, disease, or genetic conditions.  IF your procedure requires a prolonged use of radiation, you will be notified and given written instructions for further action.  It is your responsibility to monitor the irradiated area for the 2 weeks following the procedure and to notify your physician if you are concerned that you have suffered a radiation induced injury.    All of the patient's questions were answered, patient is agreeable to proceed. Consent signed and in chart.  Pt and family understand if intervention is performed pt will be admitted to Neuro ICU overnight. Plan for discharge in am if stable Agreeable to proceed   Thank you for this interesting consult.  I greatly enjoyed meeting [name] and look forward to participating in their care.  A copy of this report was sent to the requesting provider on this date.  Electronically  Signed: Robet Leu, PA-C 06/28/2017, 8:13 AM   I spent a total of    25 Minutes in face to face in clinical consultation, greater than 50% of which was counseling/coordinating care for R MCA aneurysm embolization

## 2017-06-28 NOTE — Procedures (Signed)
S/P RT common carotid arteriogram. RT CFA approach. Findings. 1.Placement of a 3mm x 24 mm Atlas neuroform stent into sup division of RT MCA.( Y technique)

## 2017-06-28 NOTE — Anesthesia Postprocedure Evaluation (Signed)
Anesthesia Post Note  Patient: [name]  Procedure(s) Performed: Procedure(s) (LRB): RADIOLOGY WITH ANESTHESIA-EMBOLIZATION (N/A)     Patient location during evaluation: PACU Anesthesia Type: General Level of consciousness: awake and alert Pain management: pain level controlled Vital Signs Assessment: post-procedure vital signs reviewed and stable Respiratory status: spontaneous breathing, nonlabored ventilation, respiratory function stable and patient connected to nasal cannula oxygen Cardiovascular status: blood pressure returned to baseline and stable Postop Assessment: no apparent nausea or vomiting Anesthetic complications: no    Last Vitals:  Vitals:   06/28/17 1245 06/28/17 1300  BP:    Pulse: (!) 50 (!) 54  Resp: 15 13  Temp:    SpO2: 98% 99%    Last Pain:  Vitals:   06/28/17 0618  TempSrc: Oral                 Beryle Lathe

## 2017-06-28 NOTE — Progress Notes (Signed)
Referring Physician(s): Dr Pearlean Brownie  Supervising Physician: [name]  Patient Status:  [facility] - In-pt  Chief Complaint:  R MCA aneurysm   Subjective:  Findings. 1.Placement of a 3mm x 24 mm Atlas neuroform stent into sup division of RT MCA.( Y technique  Resting Doing well No complaints Moving all 4s Denies headache  Allergies: Tylox [oxycodone-acetaminophen] and Adhesive [tape]  Medications: Prior to Admission medications   Medication Sig Start Date End Date Taking? Authorizing Provider  amiodarone (PACERONE) 200 MG tablet Take 1 tablet (200 mg total) by mouth daily. 12/20/16  Yes Orpah Cobb, MD  aspirin EC 81 MG tablet Take 81 mg by mouth daily.    Yes [provider]  atorvastatin (LIPITOR) 20 MG tablet Take 1 tablet (20 mg total) by mouth daily at 6 PM. Patient taking differently: Take 20 mg by mouth every evening.  07/06/14  Yes Rizwan, Ladell Heads, MD  clopidogrel (PLAVIX) 75 MG tablet Take 75 mg by mouth daily.   Yes [provider]  gabapentin (NEURONTIN) 100 MG capsule TAKE 1 CAPSULE BY MOUTH THREE TIMES DAILY 08/25/15  Yes Marvel Plan, MD  hydrochlorothiazide (HYDRODIURIL) 25 MG tablet Take 0.5 tablets (12.5 mg total) by mouth daily. 12/19/16  Yes Orpah Cobb, MD  metFORMIN (GLUCOPHAGE) 500 MG tablet Take 1 tablet (500 mg total) by mouth 2 (two) times daily with a meal. 07/06/14  Yes Rizwan, Ladell Heads, MD  nitroGLYCERIN (NITROSTAT) 0.4 MG SL tablet Place 1 tablet (0.4 mg total) under the tongue every 5 (five) minutes x 3 doses as needed for chest pain. 07/08/14  Yes Orpah Cobb, MD  umeclidinium-vilanterol (ANORO ELLIPTA) 62.5-25 MCG/INH AEPB Inhale 1 puff into the lungs daily.    Yes [provider]  albuterol (PROVENTIL HFA;VENTOLIN HFA) 108 (90 BASE) MCG/ACT inhaler Inhale 2 puffs into the lungs every 6 (six) hours as needed for wheezing or shortness of breath.     [provider]  apixaban (ELIQUIS) 5 MG TABS tablet Take 5 mg  by mouth 2 (two) times daily.    [provider]  esomeprazole (NEXIUM) 40 MG capsule Take 40 mg by mouth at bedtime as needed (for acid reflux).     [provider]     Vital Signs: BP 98/70 (BP Location: Left Arm)   Pulse (!) 52   Temp (!) 97.2 F (36.2 C)   Resp 16   Wt 257 lb (116.6 kg)   SpO2 97%   BMI 39.08 kg/m   Physical Exam  Constitutional: He is oriented to person, place, and time.  Abdominal: Soft. Bowel sounds are normal.  Musculoskeletal: Normal range of motion.  Good strength and sensation B  Neurological: He is alert and oriented to person, place, and time.  Skin: Skin is warm and dry.  Right groin site clean and dry NT no bleeding No hematoma Rt foot 2+ pulses  Psychiatric: He has a normal mood and affect.  Nursing note and vitals reviewed.   Imaging: No results found.  Labs:  CBC:  Recent Labs  04/22/17 0650 05/05/17 1342 05/11/17 0435 06/28/17 0646  WBC 6.6 6.5 10.4 5.8  HGB 13.5 13.5 12.1* 12.5*  HCT 40.5 39.9 36.1* 37.5*  PLT 224 222 206 215    COAGS:  Recent Labs  12/18/16 1018 04/22/17 0650 05/05/17 1342 06/28/17 0646 06/28/17 0741  INR 1.02 0.99 0.93  --  0.96  APTT  --  --     BMP:  Recent  Labs  04/22/17 0650 05/05/17 1342 05/11/17 0435 06/28/17 0646  NA 138 138 136 138  K 3.6 3.6 3.7 3.6  CL 102 102 103 106  CO2 GLUCOSE 113* 103* 146* 104*  BUN CALCIUM 9.0 9.3 8.2* 8.8*  CREATININE 0.96 1.04 0.77 0.96  GFRNONAA >60 >60 >60 >60  GFRAA >60 >60 >60 >60    LIVER FUNCTION TESTS:  Recent Labs  10/19/16 0824 12/17/16 1817 03/30/17 1945 06/28/17 0646  BILITOT 0.5 0.5 0.4 0.5  AST ALT ALKPHOS 51 52 63 51  PROT 6.5 6.3* 7.1 6.2*  ALBUMIN 3.9 3.4* 4.4 3.7    Assessment and Plan:  R MCA aneurysm embolization in IR today Admit to Neuro ICU Plan for dc in am   Electronically Signed: Jalon Blackwelder A, PA-C 06/28/2017, 3:31  PM   I spent a total of 15 Minutes at the the patient's bedside AND on the patient's hospital floor or unit, greater than 50% of which was counseling/coordinating care for R MCA aneurysm embo

## 2017-06-28 NOTE — Plan of Care (Signed)
Problem: Safety: Goal: Ability to remain free from injury will improve Outcome: Progressing Patient educated and aware from previous admissions that he needs to call before ambulating and ask for assistance.

## 2017-06-28 NOTE — Anesthesia Procedure Notes (Signed)
Arterial Line Insertion Start/End9/17/2018 8:36 AM, 06/28/2017 8:43 AM Performed by: Leslye Peer E  Patient location: Pre-op. Preanesthetic checklist: patient identified, IV checked, risks and benefits discussed, surgical consent, monitors and equipment checked, pre-op evaluation, timeout performed and anesthesia consent Lidocaine 1% used for infiltration radial was placed Catheter size: 20 Fr Hand hygiene performed  and maximum sterile barriers used   Attempts: 3 (1 attempt by CRNA left radial, success on 2nd attempt by Dr. Mal Amabile on right radial) Procedure performed without using ultrasound guided technique. Following insertion, dressing applied and Biopatch. Post procedure assessment: normal and unchanged  Patient tolerated the procedure well with no immediate complications.

## 2017-06-29 ENCOUNTER — Encounter (HOSPITAL_COMMUNITY): Payer: Self-pay | Admitting: Interventional Radiology

## 2017-06-29 LAB — CBC WITH DIFFERENTIAL/PLATELET
Basophils Absolute: 0 10*3/uL (ref 0.0–0.1)
Basophils Relative: 0 %
EOS PCT: 2 %
Eosinophils Absolute: 0.1 10*3/uL (ref 0.0–0.7)
HEMATOCRIT: 35.5 % — AB (ref 39.0–52.0)
HEMOGLOBIN: 11.7 g/dL — AB (ref 13.0–17.0)
Lymphocytes Relative: 30 %
Lymphs Abs: 1.8 10*3/uL (ref 0.7–4.0)
MCH: 29 pg (ref 26.0–34.0)
MCHC: 33 g/dL (ref 30.0–36.0)
MCV: 88.1 fL (ref 78.0–100.0)
MONO ABS: 0.4 10*3/uL (ref 0.1–1.0)
Monocytes Relative: 6 %
Neutro Abs: 3.8 10*3/uL (ref 1.7–7.7)
Neutrophils Relative %: 62 %
Platelets: 183 10*3/uL (ref 150–400)
RBC: 4.03 MIL/uL — ABNORMAL LOW (ref 4.22–5.81)
RDW: 12.8 % (ref 11.5–15.5)
WBC: 6.1 10*3/uL (ref 4.0–10.5)

## 2017-06-29 LAB — GLUCOSE, CAPILLARY
Glucose-Capillary: 93 mg/dL (ref 65–99)
Glucose-Capillary: 94 mg/dL (ref 65–99)

## 2017-06-29 LAB — BASIC METABOLIC PANEL
Anion gap: 4 — ABNORMAL LOW (ref 5–15)
BUN: 8 mg/dL (ref 6–20)
CALCIUM: 8.2 mg/dL — AB (ref 8.9–10.3)
CO2: 28 mmol/L (ref 22–32)
Chloride: 107 mmol/L (ref 101–111)
Creatinine, Ser: 0.86 mg/dL (ref 0.61–1.24)
GFR calc Af Amer: >60 mL/min (ref >60)
GLUCOSE: 103 mg/dL — AB (ref 65–99)
Potassium: 3.8 mmol/L (ref 3.5–5.1)
Sodium: 139 mmol/L (ref 135–145)

## 2017-06-29 LAB — PLATELET INHIBITION P2Y12: PLATELET FUNCTION P2Y12: 41 [PRU] — AB (ref 194–418)

## 2017-06-29 MED ORDER — PNEUMOCOCCAL VAC POLYVALENT 25 MCG/0.5ML IJ INJ
0.5000 mL | INJECTION | Freq: Once | INTRAMUSCULAR | Status: AC
  Administered 2017-06-29: 0.5 mL via INTRAMUSCULAR
  Filled 2017-06-29: qty 0.5

## 2017-06-29 NOTE — Progress Notes (Signed)
Patient ID: [name], male   DOB: 14-Aug-1961, 56 y.o.   MRN: [id number] INR. Day 1 post  Treatment of RT MCA bifurcation aneurysm.  Comfortable night. Denies worsening H/As,N/V visual , motor or speech difficultis. No chest pains or SOB. Eating without difficulties. IV heparin stopped.  O/E  VS  BP 120s/70s.HR 50 to 60 SR. PaO2 > 95% RA.  Neurologically. AAOx3. Speech and comprehension WNLs.  Pupils 3mm RRR tolight . EOMs full . Nystagmus. VF full to confrontation . Tongure in the midline.. Motor  No drift. Power 5/5 P and D all 4s. Coordination . F to N RT = Lt.  RT groin soft. No hematoma. Distal pulses. Both DPs and PTs palpable.  Plan. 1.Ambulate.  2. Increase fliud intake. 3.If tolerated D/C to home . 4.Advised to maintain hydration,and driving for 1 to 2 eeks. 5.Get stat P2 Y 12 today. 6.Leave on aspirin 81 and plavix 75 for 2 weeks.,then D/C plavix? And start eliquis? 7.RTC in clinic in 2 weeks. D/W fully with patient. Call 911 with new neurological symptoms. Patient expressed understanding.  S.Keegen Heffern MD

## 2017-06-29 NOTE — Progress Notes (Signed)
Referring Physician(s): Dr Lina Sayre  Supervising Physician: [name]  Patient Status:  [facility] - In-pt  Chief Complaint:  R MCA aneurysm embolization  Subjective:  Doing well this am Slept well\did have small headache in back of head early this am----resolved within minutes--no meds Denies headache now Denies N/V No visual or speech changes Foley out  Allergies: Tylox [oxycodone-acetaminophen] and Adhesive [tape]  Medications: Prior to Admission medications   Medication Sig Start Date End Date Taking? Authorizing Provider  amiodarone (PACERONE) 200 MG tablet Take 1 tablet (200 mg total) by mouth daily. 12/20/16  Yes Orpah Cobb, MD  aspirin EC 81 MG tablet Take 81 mg by mouth daily.    Yes [provider]  atorvastatin (LIPITOR) 20 MG tablet Take 1 tablet (20 mg total) by mouth daily at 6 PM. Patient taking differently: Take 20 mg by mouth every evening.  07/06/14  Yes Rizwan, Ladell Heads, MD  clopidogrel (PLAVIX) 75 MG tablet Take 75 mg by mouth daily.   Yes [provider]  gabapentin (NEURONTIN) 100 MG capsule TAKE 1 CAPSULE BY MOUTH THREE TIMES DAILY 08/25/15  Yes Marvel Plan, MD  hydrochlorothiazide (HYDRODIURIL) 25 MG tablet Take 0.5 tablets (12.5 mg total) by mouth daily. 12/19/16  Yes Orpah Cobb, MD  metFORMIN (GLUCOPHAGE) 500 MG tablet Take 1 tablet (500 mg total) by mouth 2 (two) times daily with a meal. 07/06/14  Yes Rizwan, Ladell Heads, MD  nitroGLYCERIN (NITROSTAT) 0.4 MG SL tablet Place 1 tablet (0.4 mg total) under the tongue every 5 (five) minutes x 3 doses as needed for chest pain. 07/08/14  Yes Orpah Cobb, MD  umeclidinium-vilanterol (ANORO ELLIPTA) 62.5-25 MCG/INH AEPB Inhale 1 puff into the lungs daily.    Yes [provider]  albuterol (PROVENTIL HFA;VENTOLIN HFA) 108 (90 BASE) MCG/ACT inhaler Inhale 2 puffs into the lungs every 6 (six) hours as needed for wheezing or shortness of breath.     [provider]  apixaban  (ELIQUIS) 5 MG TABS tablet Take 5 mg by mouth 2 (two) times daily.    [provider]  esomeprazole (NEXIUM) 40 MG capsule Take 40 mg by mouth at bedtime as needed (for acid reflux).     [provider]     Vital Signs: BP 98/61   Pulse (!) 51   Temp 97.9 F (36.6 C) (Oral)   Resp 15   Wt 257 lb (116.6 kg)   SpO2 100%   BMI 39.08 kg/m   Physical Exam  Constitutional: He is oriented to person, place, and time.  In NAD Face symmetrical Tongue midline Smile =  Cardiovascular: Normal rate, regular rhythm and normal heart sounds.   Pulmonary/Chest: Effort normal and breath sounds normal.  Abdominal: Soft. Bowel sounds are normal.  Musculoskeletal: Normal range of motion.  Good strength and sensation B  Neurological: He is alert and oriented to person, place, and time.  Skin: Skin is warm and dry.  Right groin site clean and dry NT No bleeding No hematoma Rt foot 2+ pulses  Psychiatric: He has a normal mood and affect. His behavior is normal.  Nursing note and vitals reviewed.   Imaging: No results found.  Labs:  CBC:  Recent Labs  05/05/17 1342 05/11/17 0435 06/28/17 0646 06/29/17 0444  WBC 6.5 10.4 5.8 6.1  HGB 13.5 12.1* 12.5* 11.7*  HCT 39.9 36.1* 37.5* 35.5*  PLT 222 206 215 183    COAGS:  Recent Labs  12/18/16 1018 04/22/17 0650 05/05/17 1342  06/28/17 0646 06/28/17 0741  INR 1.02 0.99 0.93  --  0.96  APTT  --  --     BMP:  Recent Labs  05/05/17 1342 05/11/17 0435 06/28/17 0646 06/29/17 0444  NA 138 136 138 139  K 3.6 3.7 3.6 3.8  CL 102 103 106 107  CO2 GLUCOSE 103* 146* 104* 103*  BUN CALCIUM 9.3 8.2* 8.8* 8.2*  CREATININE 1.04 0.77 0.96 0.86  GFRNONAA >60 >60 >60 >60  GFRAA >60 >60 >60 >60    LIVER FUNCTION TESTS:  Recent Labs  10/19/16 0824 12/17/16 1817 03/30/17 1945 06/28/17 0646  BILITOT 0.5 0.5 0.4 0.5  AST ALT ALKPHOS 51 52 63 51    PROT 6.5 6.3* 7.1 6.2*  ALBUMIN 3.9 3.4* 4.4 3.7    Assessment and Plan:  R MCA aneurysm Embolization in IR 9/17 Doing well Dc art line Hep lock IV Dr [name] to see---plan for dc today  Electronically Signed: Jaisen Wiltrout A, PA-C 06/29/2017, 7:27 AM   I spent a total of 15 Minutes at the the patient's bedside AND on the patient's hospital floor or unit, greater than 50% of which was counseling/coordinating care for R MCA aneurysm embolization

## 2017-06-29 NOTE — Discharge Summary (Signed)
Patient ID: [name] MRN: [id number] DOB/AGE: 55-Sep-1962 55 y.o.  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 Stage Right middle cerebral artery aneurysm embolization. LVIS Stent placed 05/10/2017. Second stage treatment 06/28/17 in IR with Dr [name]: Placement of a 3mm x 24 mm Atlas neuroform stent into sup division of RT MCA.( Y technique). Admitted for overnight into Neuro ICU. Uneventful night. Did have minimal headache early this am- located in back of head. Resolved on own within few minutes. Denies N/V. Denies speech or visual changes. Slept well Eating well; urinating on own Passing gas. Up in room and ambulating Pt has been seen and examined by myself and Dr [name] Plan for discharge to home   Consults: None  Significant Diagnostic Studies: Cerebral arteriogram  Treatments: S/P RT common carotid arteriogram. RT CFA approach. Findings. 1.Placement of a 3mm x 24 mm Atlas neuroform stent into sup division of RT MCA.( Y technique)  Discharge Exam: Blood pressure 119/69, pulse (!) 51, temperature 97.9 F (36.6 C), temperature source Oral, resp. rate 17, weight 257 lb (116.6 kg), SpO2 95 %.  Physical Exam  Constitutional: He is oriented to person, place, and time.  In NAD Face symmetrical Tongue midline Smile =  Cardiovascular: Normal rate, regular rhythm and normal heart sounds.   Pulmonary/Chest: Effort normal and breath sounds normal.  Abdominal: Soft. Bowel sounds are normal.  Musculoskeletal: Normal range of motion.  Good strength and sensation B  Neurological: He is alert and oriented to person, place, and time.  Skin: Skin is warm and dry.  Right groin site clean and dry NT No  bleeding No hematoma Rt foot 2+ pulses  Psychiatric: He has a normal mood and affect. His behavior is normal.  Nursing note and vitals reviewed.  Results for orders placed or performed during the hospital encounter of 06/28/17  APTT  Result Value Ref Range   aPTT 28 24 - 36 seconds  CBC WITH DIFFERENTIAL  Result Value Ref Range   WBC 5.8 4.0 - 10.5 K/uL   RBC 4.31 4.22 - 5.81 MIL/uL   Hemoglobin 12.5 (L) 13.0 - 17.0 g/dL   HCT 40.9 (L) 81.1 - 91.4 %   MCV 87.0 78.0 - 100.0 fL   MCH 29.0 26.0 - 34.0 pg   MCHC 33.3 30.0 - 36.0 g/dL   RDW 78.2 95.6 - 21.3 %   Platelets 215 150 - 400 K/uL   Neutrophils Relative % 60 %   Neutro Abs 3.5 1.7 - 7.7 K/uL   Lymphocytes Relative 30 %   Lymphs Abs 1.7 0.7 - 4.0 K/uL   Monocytes Relative 8 %   Monocytes Absolute 0.5 0.1 - 1.0 K/uL   Eosinophils Relative 2 %   Eosinophils Absolute 0.1 0.0 - 0.7 K/uL   Basophils Relative 0 %   Basophils Absolute 0.0 0.0 - 0.1 K/uL  Comprehensive metabolic panel  Result Value Ref Range   Sodium 138 135 - 145 mmol/L   Potassium 3.6 3.5 - 5.1 mmol/L   Chloride 106 101 - 111 mmol/L   CO2 25 22 - 32 mmol/L   Glucose, Bld 104 (H) 65 - 99 mg/dL   BUN 11 6 - 20 mg/dL   Creatinine, Ser 0.86 0.61 - 1.24 mg/dL  Calcium 8.8 (L) 8.9 - 10.3 mg/dL   Total Protein 6.2 (L) 6.5 - 8.1 g/dL   Albumin 3.7 3.5 - 5.0 g/dL   AST 18 15 - 41 U/L   ALT 21 17 - 63 U/L   Alkaline Phosphatase 51 38 - 126 U/L   Total Bilirubin 0.5 0.3 - 1.2 mg/dL   GFR calc non Af Amer >60 >60 mL/min   GFR calc Af Amer >60 >60 mL/min   Anion gap 7 5 - 15  Platelet inhibition p2y12 (not at [facility])  Result Value Ref Range   Platelet Function  P2Y12 116 (L) 194 - 418 PRU  Glucose, capillary  Result Value Ref Range   Glucose-Capillary 103 (H) 65 - 99 mg/dL   Comment 1 Notify RN    Comment 2 Document in Chart   Heparin level (unfractionated)  Result Value Ref Range   Heparin Unfractionated 0.12 (L) 0.30 - 0.70 IU/mL  Basic metabolic panel    Result Value Ref Range   Sodium 139 135 - 145 mmol/L   Potassium 3.8 3.5 - 5.1 mmol/L   Chloride 107 101 - 111 mmol/L   CO2 28 22 - 32 mmol/L   Glucose, Bld 103 (H) 65 - 99 mg/dL   BUN 8 6 - 20 mg/dL   Creatinine, Ser 1.61 0.61 - 1.24 mg/dL   Calcium 8.2 (L) 8.9 - 10.3 mg/dL   GFR calc non Af Amer >60 >60 mL/min   GFR calc Af Amer >60 >60 mL/min   Anion gap 4 (L) 5 - 15  CBC WITH DIFFERENTIAL  Result Value Ref Range   WBC 6.1 4.0 - 10.5 K/uL   RBC 4.03 (L) 4.22 - 5.81 MIL/uL   Hemoglobin 11.7 (L) 13.0 - 17.0 g/dL   HCT 09.6 (L) 04.5 - 40.9 %   MCV 88.1 78.0 - 100.0 fL   MCH 29.0 26.0 - 34.0 pg   MCHC 33.0 30.0 - 36.0 g/dL   RDW 81.1 91.4 - 78.2 %   Platelets 183 150 - 400 K/uL   Neutrophils Relative % 62 %   Neutro Abs 3.8 1.7 - 7.7 K/uL   Lymphocytes Relative 30 %   Lymphs Abs 1.8 0.7 - 4.0 K/uL   Monocytes Relative 6 %   Monocytes Absolute 0.4 0.1 - 1.0 K/uL   Eosinophils Relative 2 %   Eosinophils Absolute 0.1 0.0 - 0.7 K/uL   Basophils Relative 0 %   Basophils Absolute 0.0 0.0 - 0.1 K/uL  Glucose, capillary  Result Value Ref Range   Glucose-Capillary 102 (H) 65 - 99 mg/dL   Comment 1 Notify RN    Comment 2 Document in Chart   Glucose, capillary  Result Value Ref Range   Glucose-Capillary 133 (H) 65 - 99 mg/dL  Glucose, capillary  Result Value Ref Range   Glucose-Capillary 93 65 - 99 mg/dL   Comment 1 Notify RN    Comment 2 Document in Chart     Disposition: Right middle cerebral artery aneurysm embolization 06/28/17 in IR Pt had uneventful overnight admission Doing well He has been seen and evaluated by Dr [name] Plan for discharge to home. Resume meds  And (ASA 81 mg and Plavix 75 mg daily) Eliquis---discontinue for now Return to see Dr [name] 2 weeks----will decide on Eliquis or Plavix at that time Pt has good understanding of discharge instructions   Discharge Instructions    Call MD for:  difficulty breathing, headache or visual disturbances  Complete by:  As directed    Call MD for:  extreme fatigue    Complete by:  As directed    Call MD for:  hives    Complete by:  As directed    Call MD for:  persistant dizziness or light-headedness    Complete by:  As directed    Call MD for:  persistant nausea and vomiting    Complete by:  As directed    Call MD for:  redness, tenderness, or signs of infection (pain, swelling, redness, odor or green/yellow discharge around incision site)    Complete by:  As directed    Call MD for:  severe uncontrolled pain    Complete by:  As directed    Call MD for:  temperature >100.4    Complete by:  As directed    Diet - low sodium heart healthy    Complete by:  As directed    Discharge instructions    Complete by:  As directed    Follow up appt 2 weeks with Dr Debarah Crape will hear from scheduler for time and date; resume all home meds except Eliquis---take ASA 81 mg and Plavix 75 mg daily   Discharge wound care:    Complete by:  As directed    May shower today; keep clean bandaid on Rt groin site daily   Driving Restrictions    Complete by:  As directed    No driving x 2 weeks   Increase activity slowly    Complete by:  As directed    Lifting restrictions    Complete by:  As directed    No lifting over 10 lbs x 2 weeks     Allergies as of 06/29/2017      Reactions   Tylox [oxycodone-acetaminophen] Hives, Other (See Comments), Rash   Sweating, shaking Reaction: Tremors and diaphoresis    Adhesive [tape] Rash      Medication List    STOP taking these medications   ELIQUIS 5 MG Tabs tablet Generic drug:  apixaban     TAKE these medications   albuterol 108 (90 Base) MCG/ACT inhaler Commonly known as:  PROVENTIL HFA;VENTOLIN HFA Inhale 2 puffs into the lungs every 6 (six) hours as needed for wheezing or shortness of breath.   amiodarone 200 MG tablet Commonly known as:  PACERONE Take 1 tablet (200 mg total) by mouth daily.   ANORO ELLIPTA 62.5-25 MCG/INH Aepb Generic  drug:  umeclidinium-vilanterol Inhale 1 puff into the lungs daily.   aspirin EC 81 MG tablet Take 81 mg by mouth daily.   atorvastatin 20 MG tablet Commonly known as:  LIPITOR Take 1 tablet (20 mg total) by mouth daily at 6 PM. What changed:  when to take this   clopidogrel 75 MG tablet Commonly known as:  PLAVIX Take 75 mg by mouth daily.   esomeprazole 40 MG capsule Commonly known as:  NEXIUM Take 40 mg by mouth at bedtime as needed (for acid reflux).   gabapentin 100 MG capsule Commonly known as:  NEURONTIN TAKE 1 CAPSULE BY MOUTH THREE TIMES DAILY   hydrochlorothiazide 25 MG tablet Commonly known as:  HYDRODIURIL Take 0.5 tablets (12.5 mg total) by mouth daily.   metFORMIN 500 MG tablet Commonly known as:  GLUCOPHAGE Take 1 tablet (500 mg total) by mouth 2 (two) times daily with a meal.   nitroGLYCERIN 0.4 MG SL tablet Commonly known as:  NITROSTAT Place 1 tablet (0.4 mg total) under the tongue every  5 (five) minutes x 3 doses as needed for chest pain.            Discharge Care Instructions        Start     Ordered   06/29/17 0000  IR Radiologist Eval & Mgmt    Question Answer Comment  Reason for Exam (SYMPTOM  OR DIAGNOSIS REQUIRED) follow up 2 weeks with Dr [name] post R MCA aneurysm embolization 9/17   Preferred Imaging Location? [facility]      06/29/17 1134   06/29/17 0000  Increase activity slowly     06/29/17 1134   06/29/17 0000  Diet - low sodium heart healthy     06/29/17 1134   06/29/17 0000  Discharge instructions    Comments:  Follow up appt 2 weeks with Dr Debarah Crape will hear from scheduler for time and date; resume all home meds except Eliquis---take ASA 81 mg and Plavix 75 mg daily   06/29/17 1134   06/29/17 0000  Driving Restrictions    Comments:  No driving x 2 weeks   16/10/96 1134   06/29/17 0000  Lifting restrictions    Comments:  No lifting over 10 lbs x 2 weeks   06/29/17 1134   06/29/17 0000  Discharge wound  care:    Comments:  May shower today; keep clean bandaid on Rt groin site daily   06/29/17 1134   06/29/17 0000  Call MD for:  temperature >100.4     06/29/17 1134   06/29/17 0000  Call MD for:  persistant nausea and vomiting     06/29/17 1134   06/29/17 0000  Call MD for:  severe uncontrolled pain     06/29/17 1134   06/29/17 0000  Call MD for:  redness, tenderness, or signs of infection (pain, swelling, redness, odor or green/yellow discharge around incision site)     06/29/17 1134   06/29/17 0000  Call MD for:  difficulty breathing, headache or visual disturbances     06/29/17 1134   06/29/17 0000  Call MD for:  hives     06/29/17 1134   06/29/17 0000  Call MD for:  persistant dizziness or light-headedness     06/29/17 1134   06/29/17 0000  Call MD for:  extreme fatigue     06/29/17 1134     Follow-up Information    Ivan Cotton, MD Follow up in 2 week(s).   Specialty:  Interventional Radiology Why:  pt will hear from scheduler for time and date of follow up appt; call [phone] with questions/concerns Contact information: [address] [place] [address] [phone]            Electronically Signed: Ralene Muskrat A, PA-C 06/29/2017, 11:36 AM   I have spent Greater Than 30 Minutes discharging Mariea Clonts.

## 2017-06-29 NOTE — Progress Notes (Signed)
ANTICOAGULATION CONSULT NOTE  Pharmacy Consult for heparin Indication: post-interventional neuroradiological procedure  Heparin Dosing Weight: 94.8 kg  Assessment: 56 yom with cerebral artery aneurysm. Pharmacy consulted to dose heparin post-IR neuroradiological procedure. Heparin currently running at 900 units/hr and heparin level therapeutic at 0.12.   CBC stable and no s/s bleeding noted.   Goal of Therapy:  Heparin level 0.1-0.25 units/ml Monitor platelets by anticoagulation protocol: Yes   Plan:  Continue heparin gtt at 900 units/hr CBC in AM Monitor for s/sx bleeding Heparin off at 0700 per protocol  York Cerise, PharmD Clinical Pharmacist 06/29/17 12:28 AM

## 2017-06-30 ENCOUNTER — Encounter (HOSPITAL_COMMUNITY): Payer: Self-pay | Admitting: Interventional Radiology

## 2017-07-13 ENCOUNTER — Ambulatory Visit (HOSPITAL_COMMUNITY)
Admission: RE | Admit: 2017-07-13 | Discharge: 2017-07-13 | Disposition: A | Discharge status: Home / Self Care | Payer: Medicare Other | Source: Ambulatory Visit | Attending: Radiology | Admitting: Radiology

## 2017-07-13 ENCOUNTER — Other Ambulatory Visit (HOSPITAL_COMMUNITY): Payer: Self-pay

## 2017-07-13 DIAGNOSIS — Z7902 Long term (current) use of antithrombotics/antiplatelets: Secondary | ICD-10-CM | POA: Insufficient documentation

## 2017-07-13 DIAGNOSIS — I729 Aneurysm of unspecified site: Secondary | ICD-10-CM

## 2017-07-13 DIAGNOSIS — I6320 Cerebral infarction due to unspecified occlusion or stenosis of unspecified precerebral arteries: Secondary | ICD-10-CM

## 2017-07-13 DIAGNOSIS — I671 Cerebral aneurysm, nonruptured: Secondary | ICD-10-CM | POA: Diagnosis not present

## 2017-07-13 DIAGNOSIS — Z7982 Long term (current) use of aspirin: Secondary | ICD-10-CM | POA: Insufficient documentation

## 2017-07-13 DIAGNOSIS — R51 Headache: Secondary | ICD-10-CM | POA: Diagnosis present

## 2017-07-13 HISTORY — PX: IR RADIOLOGIST EVAL & MGMT: IMG5224

## 2017-07-13 LAB — PLATELET INHIBITION P2Y12: Platelet Function  P2Y12: 3 [PRU] — ABNORMAL LOW (ref 194–418)

## 2017-07-13 LAB — BASIC METABOLIC PANEL
Anion gap: 8 (ref 5–15)
BUN: 11 mg/dL (ref 6–20)
CO2: 28 mmol/L (ref 22–32)
CREATININE: 1.06 mg/dL (ref 0.61–1.24)
Calcium: 9.2 mg/dL (ref 8.9–10.3)
Chloride: 104 mmol/L (ref 101–111)
GFR calc Af Amer: >60 mL/min (ref >60)
GFR calc non Af Amer: >60 mL/min (ref >60)
GLUCOSE: 132 mg/dL — AB (ref 65–99)
POTASSIUM: 3.7 mmol/L (ref 3.5–5.1)
Sodium: 140 mmol/L (ref 135–145)

## 2017-07-13 LAB — LIPID PANEL
Cholesterol: 122 mg/dL (ref 0–200)
HDL: 37 mg/dL — ABNORMAL LOW (ref >40)
LDL CALC: 55 mg/dL (ref 0–99)
TRIGLYCERIDES: 148 mg/dL (ref <150)
Total CHOL/HDL Ratio: 3.3 RATIO
VLDL: 30 mg/dL (ref 0–40)

## 2017-07-13 LAB — HEPATIC FUNCTION PANEL
ALK PHOS: 66 U/L (ref 38–126)
ALT: 20 U/L (ref 17–63)
AST: 19 U/L (ref 15–41)
Albumin: 3.8 g/dL (ref 3.5–5.0)
BILIRUBIN TOTAL: 0.4 mg/dL (ref 0.3–1.2)
TOTAL PROTEIN: 6.5 g/dL (ref 6.5–8.1)

## 2017-07-13 LAB — HEMOGLOBIN A1C
HEMOGLOBIN A1C: 5.2 % (ref 4.8–5.6)
Mean Plasma Glucose: 102.54 mg/dL

## 2017-07-14 ENCOUNTER — Encounter (HOSPITAL_COMMUNITY): Payer: Self-pay | Admitting: Interventional Radiology

## 2017-07-22 ENCOUNTER — Other Ambulatory Visit (HOSPITAL_COMMUNITY): Payer: Self-pay | Admitting: Interventional Radiology

## 2017-07-22 DIAGNOSIS — I671 Cerebral aneurysm, nonruptured: Secondary | ICD-10-CM

## 2017-07-27 ENCOUNTER — Ambulatory Visit (INDEPENDENT_AMBULATORY_CARE_PROVIDER_SITE_OTHER): Payer: Medicare Other | Admitting: Neurology

## 2017-07-27 DIAGNOSIS — I671 Cerebral aneurysm, nonruptured: Secondary | ICD-10-CM

## 2017-07-27 DIAGNOSIS — Z9889 Other specified postprocedural states: Secondary | ICD-10-CM

## 2017-07-27 DIAGNOSIS — Z6838 Body mass index (BMI) 38.0-38.9, adult: Secondary | ICD-10-CM

## 2017-07-27 DIAGNOSIS — E662 Morbid (severe) obesity with alveolar hypoventilation: Secondary | ICD-10-CM

## 2017-07-27 DIAGNOSIS — J449 Chronic obstructive pulmonary disease, unspecified: Secondary | ICD-10-CM

## 2017-07-27 DIAGNOSIS — R0902 Hypoxemia: Secondary | ICD-10-CM | POA: Diagnosis not present

## 2017-07-27 DIAGNOSIS — I669 Occlusion and stenosis of unspecified cerebral artery: Secondary | ICD-10-CM

## 2017-07-30 MED ORDER — CYANOCOBALAMIN 1000 MCG/ML IJ SOLN
1000.0000 ug | Freq: Once | INTRAMUSCULAR | 0 refills | Status: AC
Start: 2017-07-30 — End: 2017-07-30

## 2017-07-30 NOTE — Addendum Note (Signed)
Addended by: Melvyn NovasHMEIER, Zemirah Krasinski on: 07/30/2017 11:12 AM   Modules accepted: Orders

## 2017-07-30 NOTE — Procedures (Signed)
PATIENT'S NAME:  [name], [name] DOB:      05/02/1961      MR#:    [id number]     DATE OF RECORDING: 07/27/2017 REFERRING M.D.:  Tracey Harriesavid Bouska, MD Study Performed:   Baseline Polysomnogram HISTORY:   [name] is a 56 year old Caucasian right-handed male, who presents today to the sleep consultation with his mother. On July 30 [name] underwent embolization of a brain aneurysm without any complications. 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 The day after the procedure he was started on double antiplatelet therapy including Plavix and full size aspirin.  He was told that following his procedure his oxygen levels had dropped significantly and Dr. Corliss Skainseveshwar asked the patient to contact his primary care physician about an urgent sleep study.  Dr. Everlene OtherBouska quoted from family member's observation that the patient has periods of not breathing at night, awakening in the middle of the night because of shortness of breath, difficulties falling asleep once woken. He is not sure when the symptoms originally began.   The patient endorsed the Epworth Sleepiness Scale at 24/24 points.   The patient's weight 257 pounds with a height of 68 (inches), resulting in a BMI of 39.1 kg/m2. The patient's neck circumference measured 19 inches.  CURRENT MEDICATIONS: Albuterol, Amiodarone, Aspirin, Atorvastatin, Eliquis, Esomeprazole, Gabapentin, Hydrochlorothiazide, Metformin, Nitroglycerin and Umeclidinium-Vilanterol   PROCEDURE:  This is a multichannel digital polysomnogram utilizing the Somnostar 11.2 system.  Electrodes and sensors were applied and monitored per AASM Specifications.   EEG, EOG, Chin and Limb EMG, were sampled at 200 Hz.  ECG, Snore and Nasal Pressure, Thermal Airflow, Respiratory Effort, CPAP Flow and Pressure, Oximetry was sampled at 50 Hz. Digital video and audio were recorded.      BASELINE STUDY:  Lights Out was at 22:06 and Lights On at 05:00.   Total recording time (TRT) was 414 minutes, with a total sleep time (TST) of 371.5 minutes.  The patient's sleep latency was 15 minutes.  REM latency was 58 minutes.  The sleep efficiency was 89.7 %.     SLEEP ARCHITECTURE: WASO (Wake after sleep onset) was 32 minutes.  There were 12.5 minutes in Stage N1, 245.5 minutes Stage N2, 27.5 minutes Stage N3 and 86 minutes in Stage REM.  The percentage of Stage N1 was 3.4%, Stage N2 was 66.1%, Stage N3 was 7.4% and Stage R (REM sleep) was 23.1%.   RESPIRATORY ANALYSIS:  There were a total of 108 respiratory events:  3 obstructive apneas, 0 central apneas and 105 hypopneas with 0 additional respiratory event related arousals (RERAs).     The total APNEA/HYPOPNEA INDEX (AHI) was 17.4/hour and the total RESPIRATORY DISTURBANCE INDEX was 17.4 /hour.  34 events occurred in REM sleep and 142 events in NREM. The REM AHI was 23.7 /hour, versus a non-REM AHI of 15.6. The patient spent 69.5 minutes of total sleep time in the supine position and 302 minutes in non-supine. The supine AHI was 47.5 versus a non-supine AHI of 10.5.  OXYGEN SATURATION & C02:  The Wake baseline 02 saturation was 94%, with the lowest being 68%. Time spent below 89% saturation equaled 52 minutes.   PERIODIC LIMB MOVEMENTS:   The patient had a total of 0 Periodic Limb Movements. The arousals were noted as: 18 were spontaneous, 0 were associated with PLMs, and 109 were associated with respiratory events. Audio and video analysis did not show any abnormal or unusual  movements, behaviors, phonations or vocalizations.   The patient took bathroom breaks. Moderate Snoring was noted. EKG was in keeping with normal sinus rhythm (NSR). Post-study, the patient indicated that sleep was the same as usual.    IMPRESSION:  1. Moderate Obstructive Sleep Apnea (OSA), with a general AHI of 17.4/hr., REM AHI of 23.7/hr. Supine AHI of 47.5/hr.  2. Hypoxemia, with a total time of oxygen desaturation below 89%  of 52 minutes, nadir at 68%.   RECOMMENDATIONS:  1. Advise full-night, attended, CPAP titration study to optimize therapy.   2. Avoid sedative-hypnotics which may worsen sleep apnea, including alcohol and tobacco (as applicable). 3. Advise to lose weight by diet and exercise, if not contraindicated (BMI 39.1). 4. Further information regarding OSA may be obtained from [employer] (www.sleepfoundation.org) or American Sleep Apnea Association (www.sleepapnea.org). 5. A follow up appointment will be scheduled in the Sleep Clinic at [facility]. The referring provider will be notified of the results.      I certify that I have reviewed the entire raw data recording prior to the issuance of this report in accordance with the Standards of Accreditation of the American Academy of Sleep Medicine (AASM)    Melvyn Novas, MD   07/30/2017 Diplomat, American Board of Psychiatry and Neurology  Diplomat, American Board of Sleep Medicine Wellsite geologist of [employer] [facility] [employer]

## 2017-08-02 ENCOUNTER — Telehealth: Payer: Self-pay | Admitting: Neurology

## 2017-08-02 NOTE — Telephone Encounter (Signed)
Pt called he has seen sleep study results on mychart. Pt also said he was advised by [facility] a B-12 injection has been called in for him. Please call to discuss.

## 2017-08-02 NOTE — Telephone Encounter (Signed)
Pt returned call. I advised pt that Dr. Vickey Hugerohmeier reviewed their sleep study results and found that pt has mod-severe sleep apnea. Dr. Vickey Hugerohmeier recommends that pt come back in for CPAP titration study.  Pt verbalized understanding of results. Pt had no questions at this time but was encouraged to call back if questions arise.

## 2017-08-02 NOTE — Telephone Encounter (Signed)
Dr. Bonney LeitzBouska's office has called and inquiring about the patient receiving Vit B 12 shot. Pt has came to their office and has the medication vial asking for them to give him the shot. I have spoken with Dr Vickey Hugerohmeier and see does approve the medication to be given. Gave the ok over the phone with Joni ReiningNicole at Dr Bonney LeitzBouska's office.

## 2017-08-02 NOTE — Telephone Encounter (Signed)
Called patient back to go over sleep study results. No answer at this time. LVM for patient to call back.

## 2017-08-09 ENCOUNTER — Telehealth (HOSPITAL_COMMUNITY): Payer: Self-pay | Admitting: Radiology

## 2017-08-09 ENCOUNTER — Telehealth: Payer: Self-pay | Admitting: Student

## 2017-08-09 NOTE — Telephone Encounter (Signed)
Pt called this morning stating that he is having a headache with a "feeling of hot water being poured on his head" and was concerned and wanted to speak with someone about this. I let him know that Dr. Corliss Skainseveshwar is out of town but that I would give this information to a PA and have one of them call him back. He agrees to this plan. JM

## 2017-08-09 NOTE — Telephone Encounter (Signed)
Patient called with concern re: headache this AM.   Patient states last night he was watching TV when he experienced blurry vision. No other neuro symptoms and resolved completely within 15 minutes. This morning, patient was again resting when he develop a sharp, frontal headache that felt "like heart water on his head" for approximately 20 minutes which again resolved on it's own.  Patient did not need pain medication and symptoms resolved completely without intervention.   Patient has had prior strokes.  His strokes were associated with weakness and numbness.  Headache and blurry vision are new and uncharacteristic of his previous neuro symptoms.  Patient states he is fine now and has been able to go about his usual activites at home without issue.   He is planning for neuro intervention with Dr. Corliss Skainseveshwar 08/26/17.  He states Dr. Pearlean BrownieSethi is his neurologist.  Discussed case and imaging with Dr. Loreta AveWagner.   Presented patient with option of coming to the ED for evaluation.  He states his symptoms have completely resolved and he would like to see if anything changes at home prior to coming to ED especially given that he does have a procedure scheduled with Dr. Eustace Quailevershwar in ~2 weeks.  Instructed patient to go the ED if symptoms return or worsen.  He is also advised to call Dr. Pearlean BrownieSethi.   Loyce DysKacie Matthews, MS RD PA-C 4:05 PM

## 2017-08-14 ENCOUNTER — Ambulatory Visit (INDEPENDENT_AMBULATORY_CARE_PROVIDER_SITE_OTHER): Payer: Medicare Other | Admitting: Neurology

## 2017-08-14 DIAGNOSIS — Z6838 Body mass index (BMI) 38.0-38.9, adult: Secondary | ICD-10-CM

## 2017-08-14 DIAGNOSIS — R0902 Hypoxemia: Secondary | ICD-10-CM

## 2017-08-14 DIAGNOSIS — I671 Cerebral aneurysm, nonruptured: Secondary | ICD-10-CM

## 2017-08-14 DIAGNOSIS — Z9889 Other specified postprocedural states: Secondary | ICD-10-CM

## 2017-08-14 DIAGNOSIS — I669 Occlusion and stenosis of unspecified cerebral artery: Secondary | ICD-10-CM

## 2017-08-14 DIAGNOSIS — J449 Chronic obstructive pulmonary disease, unspecified: Secondary | ICD-10-CM

## 2017-08-14 DIAGNOSIS — G4733 Obstructive sleep apnea (adult) (pediatric): Secondary | ICD-10-CM

## 2017-08-14 DIAGNOSIS — E662 Morbid (severe) obesity with alveolar hypoventilation: Secondary | ICD-10-CM

## 2017-08-18 ENCOUNTER — Other Ambulatory Visit: Payer: Self-pay | Admitting: Neurology

## 2017-08-18 DIAGNOSIS — I63131 Cerebral infarction due to embolism of right carotid artery: Secondary | ICD-10-CM

## 2017-08-18 DIAGNOSIS — G4733 Obstructive sleep apnea (adult) (pediatric): Secondary | ICD-10-CM

## 2017-08-18 DIAGNOSIS — I671 Cerebral aneurysm, nonruptured: Secondary | ICD-10-CM

## 2017-08-18 DIAGNOSIS — I4891 Unspecified atrial fibrillation: Secondary | ICD-10-CM

## 2017-08-18 DIAGNOSIS — J439 Emphysema, unspecified: Secondary | ICD-10-CM

## 2017-08-18 DIAGNOSIS — J449 Chronic obstructive pulmonary disease, unspecified: Secondary | ICD-10-CM

## 2017-08-18 DIAGNOSIS — I6521 Occlusion and stenosis of right carotid artery: Secondary | ICD-10-CM

## 2017-08-18 NOTE — Procedures (Signed)
PATIENT'S NAME:  [name], [name] DOB:      09/12/1961      MR#:    [id number]     DATE OF RECORDING: 08/14/2017 REFERRING M.D.:  Tracey Harriesavid Bouska, MD Study Performed:   CPAP  Titration HISTORY:  A-fib, Aneurysm, Arthritis, Bipolar, Cataracts, Chronic Kidney disease, Chronic lower back pain, COPD, Depression, GERD, Hiatal hernia, Headaches, Hyperlipidemia, Hypertension, Joint pain and swelling, Nocturia, Numbness of both arms, Pneumonia  The baseline study results were: APNEA/HYPOPNEA INDEX (AHI) of 17.4/hour. 34 events occurred in REM sleep and 142 events in NREM. The REM AHI was 23.7 /hour, versus a non-REM AHI of 15.6. The patient's supine AHI was 47.5 versus a non-supine AHI of 10.5.  The patient endorsed the Epworth Sleepiness Scale at 24 points. The patient's weight 257 pounds with a height of 68 (inches), resulting in a BMI of 39.1 kg/m2.The patient's neck circumference measured 19 inches.  CURRENT MEDICATIONS: Albuterol, Amiodarone, Aspirin, Atorvastatin, Eliquis, Esomeprazole, Gabapentin, Hydrochlorothiazide, Metformin, Nitroglycerin and Umeclidinium-Vilanterol   PROCEDURE:  This is a multichannel digital polysomnogram utilizing the SomnoStar 11.2 system.  Electrodes and sensors were applied and monitored per AASM Specifications.   EEG, EOG, Chin and Limb EMG, were sampled at 200 Hz.  ECG, Snore and Nasal Pressure, Thermal Airflow, Respiratory Effort, CPAP Flow and Pressure, Oximetry was sampled at 50 Hz. Digital video and audio were recorded.      CPAP was initiated at 6 cmH20 with heated humidity per AASM split night standards and pressure was advanced to 12cmH20 because of hypopneas, apneas and desaturations.  At a PAP pressure of 12 cmH20, there was a reduction of the AHI to 0.0 with improvement of the above symptoms of obstructive sleep apnea.    Lights Out was at 22:16 and Lights On at 05:00. Total recording time (TRT) was 404.5 minutes, with a total sleep time (TST) of 365 minutes. The  patient's sleep latency was 18 minutes with 3 minutes of wake time after sleep onset. REM latency was 71.5 minutes.  The sleep efficiency was 90.2 %.    SLEEP ARCHITECTURE: WASO (Wake after sleep onset) was 28 minutes.  There were 25.5 minutes in Stage N1, 139 minutes Stage N2, 108.5 minutes Stage N3 and 92 minutes in Stage REM.  The percentage of Stage N1 was 7.%, Stage N2 was 38.1%, Stage N3 was 29.7% and Stage R (REM sleep) was 25.2%.   The arousals were noted as: 8 were spontaneous, 0 were associated with PLMs, and 0 were associated with respiratory events. Audio and video analysis did not show any abnormal or unusual movements, behaviors, phonations or vocalizations.  The patient took one bathroom break. Some Snoring was noted.  RESPIRATORY ANALYSIS:  There was a total of 5 respiratory events: 0 apneas and 5 hypopneas with 0 respiratory event related arousals (RERAs).      The total APNEA/HYPOPNEA INDEX  (AHI) was 0.8 /hour and the total RESPIRATORY DISTURBANCE INDEX was 0.8 /hour  0 events occurred in REM sleep and 5 events in NREM. The REM AHI was 0 /hour versus a non-REM AHI of 1.1 /hour.  The patient spent 112.5 minutes of total sleep time in the supine position and 253 minutes in non-supine. The supine AHI was 2.7, versus a non-supine AHI of 0.0.  OXYGEN SATURATION & C02:  The baseline 02 saturation was 92%, with the lowest being 90%. Time spent below 89% saturation equaled 0 minutes.  PERIODIC LIMB MOVEMENTS:  The patient had a total of 0 Periodic  Limb Movements.  The patient endorsed having slept very well.     DIAGNOSIS: Obstructive Sleep Apnea, responsive to CPAP at 12 cm water pressure with 1 cm EPR. 1. The patient was fitted with a Simplus FFM in small - a Fisher Paykel product/apparatus. A follow up appointment will be scheduled in [facility] Clinic [facility].   Please call [phone] with any questions.      I certify that I have reviewed the entire  raw data recording prior to the issuance of this report in accordance with the Standards of Accreditation of the American Academy of Sleep Medicine (AASM)     Melvyn Novasarmen Camillia Marcy, M.D.   08-18-2017  Diplomat, American Board of Psychiatry and Neurology  Diplomat, American Board of Sleep Medicine Medical Director, [facility]

## 2017-08-20 ENCOUNTER — Encounter (HOSPITAL_COMMUNITY): Payer: Self-pay

## 2017-08-20 ENCOUNTER — Other Ambulatory Visit: Payer: Self-pay | Admitting: Radiology

## 2017-08-20 NOTE — Pre-Procedure Instructions (Signed)
Lesslie Dewaine OatsL Reaume  08/20/2017      [facility] [phone] [place],  - [address] [facility] [address] [id number] Phone: [phone] Fax: [phone]    Your procedure is scheduled on Thursday November 15.  [facility] at 7:00 A.M.  Call this number if you have problems the morning of surgery:  [phone]   Remember:  Do not eat food or drink liquids after midnight.  Take these medicines the morning of surgery with A SIP OF WATER:  Aspirin Plavix (clopidogrel) Gabapentin (neurontin) Amiodarone (pacerone) Anoro Ellipta Albuterol if needed (please bring inhaler to hospital with you)  7 days prior to surgery STOP taking any Aleve, Naproxen, Ibuprofen, Motrin, Advil, Goody's, BC's, all herbal medications, fish oil, and all vitamins  Follow MD's instructions regarding Eliquis  DO NOT TAKE metformin (glucophage) the day of surgery     How to Manage Your Diabetes Before and After Surgery  Why is it important to control my blood sugar before and after surgery? . Improving blood sugar levels before and after surgery helps healing and can limit problems. . A way of improving blood sugar control is eating a healthy diet by: o  Eating less sugar and carbohydrates o  Increasing activity/exercise o  Talking with your doctor about reaching your blood sugar goals . High blood sugars (greater than 180 mg/dL) can raise your risk of infections and slow your recovery, so you will need to focus on controlling your diabetes during the weeks before surgery. . Make sure that the doctor who takes care of your diabetes knows about your planned surgery including the date and location.  How do I manage my blood sugar before surgery? . Check your blood sugar at least 4 times a day, starting 2 days before surgery, to make sure that the level is not too high or low. o Check your blood sugar the  morning of your surgery when you wake up and every 2 hours until you get to the Short Stay unit. . If your blood sugar is less than 70 mg/dL, you will need to treat for low blood sugar: o Do not take insulin. o Treat a low blood sugar (less than 70 mg/dL) with  cup of clear juice (cranberry or apple), 4 glucose tablets, OR glucose gel. Recheck blood sugar in 15 minutes after treatment (to make sure it is greater than 70 mg/dL). If your blood sugar is not greater than 70 mg/dL on recheck, call [phone] o  for further instructions. . Report your blood sugar to the short stay nurse when you get to Short Stay.  . If you are admitted to the hospital after surgery: o Your blood sugar will be checked by the staff and you will probably be given insulin after surgery (instead of oral diabetes medicines) to make sure you have good blood sugar levels. o The goal for blood sugar control after surgery is 80-180 mg/dL.          Do not wear jewelry, make-up or nail polish.  Do not wear lotions, powders, or perfumes, or deoderant.  Do not shave 48 hours prior to surgery.  Men may shave face and neck.  Do not bring valuables to the hospital.  [facility] is not responsible for any belongings or valuables.  Contacts, dentures or bridgework may not be worn into surgery.  Leave your suitcase in the car.  After surgery it may be brought to your room.  For patients admitted to the hospital, discharge time will be determined by your treatment team.  Patients discharged the day of surgery will not be allowed to drive home.   Special instructions:    [facility]- Preparing For Surgery  Before surgery, you can play an important role. Because skin is not sterile, your skin needs to be as free of germs as possible. You can reduce the number of germs on your skin by washing with CHG (chlorahexidine gluconate) Soap before surgery.  CHG is an antiseptic cleaner which kills germs and bonds with the skin to  continue killing germs even after washing.  Please do not use if you have an allergy to CHG or antibacterial soaps. If your skin becomes reddened/irritated stop using the CHG.  Do not shave (including legs and underarms) for at least 48 hours prior to first CHG shower. It is OK to shave your face.  Please follow these instructions carefully.   1. Shower the NIGHT BEFORE SURGERY and the MORNING OF SURGERY with CHG.   2. If you chose to wash your hair, wash your hair first as usual with your normal shampoo.  3. After you shampoo, rinse your hair and body thoroughly to remove the shampoo.  4. Use CHG as you would any other liquid soap. You can apply CHG directly to the skin and wash gently with a scrungie or a clean washcloth.   5. Apply the CHG Soap to your body ONLY FROM THE NECK DOWN.  Do not use on open wounds or open sores. Avoid contact with your eyes, ears, mouth and genitals (private parts). Wash Face and genitals (private parts)  with your normal soap.  6. Wash thoroughly, paying special attention to the area where your surgery will be performed.  7. Thoroughly rinse your body with warm water from the neck down.  8. DO NOT shower/wash with your normal soap after using and rinsing off the CHG Soap.  9. Pat yourself dry with a CLEAN TOWEL.  10. Wear CLEAN PAJAMAS to bed the night before surgery, wear comfortable clothes the morning of surgery  11. Place CLEAN SHEETS on your bed the night of your first shower and DO NOT SLEEP WITH PETS.    Day of Surgery: Do not apply any deodorants/lotions. Please wear clean clothes to the hospital/surgery center.      Please read over the following fact sheets that you were given. Coughing and Deep Breathing and Surgical Site Infection Prevention

## 2017-08-23 ENCOUNTER — Encounter (HOSPITAL_COMMUNITY)
Admission: RE | Admit: 2017-08-23 | Discharge: 2017-08-23 | Disposition: A | Discharge status: Home / Self Care | Payer: Medicare Other | Source: Ambulatory Visit | Attending: Interventional Radiology | Admitting: Interventional Radiology

## 2017-08-23 ENCOUNTER — Other Ambulatory Visit: Payer: Self-pay

## 2017-08-23 ENCOUNTER — Telehealth: Payer: Self-pay | Admitting: Neurology

## 2017-08-23 ENCOUNTER — Encounter (HOSPITAL_COMMUNITY): Payer: Self-pay | Admitting: Vascular Surgery

## 2017-08-23 ENCOUNTER — Encounter: Payer: Self-pay | Admitting: Neurology

## 2017-08-23 ENCOUNTER — Encounter (HOSPITAL_COMMUNITY): Payer: Self-pay

## 2017-08-23 ENCOUNTER — Telehealth: Payer: Self-pay | Admitting: Radiology

## 2017-08-23 DIAGNOSIS — Z7901 Long term (current) use of anticoagulants: Secondary | ICD-10-CM | POA: Diagnosis not present

## 2017-08-23 DIAGNOSIS — E785 Hyperlipidemia, unspecified: Secondary | ICD-10-CM | POA: Diagnosis not present

## 2017-08-23 DIAGNOSIS — I129 Hypertensive chronic kidney disease with stage 1 through stage 4 chronic kidney disease, or unspecified chronic kidney disease: Secondary | ICD-10-CM | POA: Diagnosis not present

## 2017-08-23 DIAGNOSIS — N189 Chronic kidney disease, unspecified: Secondary | ICD-10-CM | POA: Diagnosis not present

## 2017-08-23 DIAGNOSIS — G4733 Obstructive sleep apnea (adult) (pediatric): Secondary | ICD-10-CM | POA: Diagnosis not present

## 2017-08-23 DIAGNOSIS — K219 Gastro-esophageal reflux disease without esophagitis: Secondary | ICD-10-CM | POA: Diagnosis not present

## 2017-08-23 DIAGNOSIS — Z7984 Long term (current) use of oral hypoglycemic drugs: Secondary | ICD-10-CM | POA: Diagnosis not present

## 2017-08-23 DIAGNOSIS — Z7982 Long term (current) use of aspirin: Secondary | ICD-10-CM | POA: Diagnosis not present

## 2017-08-23 DIAGNOSIS — Z8673 Personal history of transient ischemic attack (TIA), and cerebral infarction without residual deficits: Secondary | ICD-10-CM | POA: Diagnosis not present

## 2017-08-23 DIAGNOSIS — J449 Chronic obstructive pulmonary disease, unspecified: Secondary | ICD-10-CM | POA: Diagnosis not present

## 2017-08-23 DIAGNOSIS — Z79899 Other long term (current) drug therapy: Secondary | ICD-10-CM | POA: Diagnosis not present

## 2017-08-23 DIAGNOSIS — E1122 Type 2 diabetes mellitus with diabetic chronic kidney disease: Secondary | ICD-10-CM | POA: Diagnosis not present

## 2017-08-23 DIAGNOSIS — I671 Cerebral aneurysm, nonruptured: Secondary | ICD-10-CM | POA: Diagnosis present

## 2017-08-23 DIAGNOSIS — F319 Bipolar disorder, unspecified: Secondary | ICD-10-CM | POA: Diagnosis not present

## 2017-08-23 DIAGNOSIS — Z538 Procedure and treatment not carried out for other reasons: Secondary | ICD-10-CM | POA: Diagnosis not present

## 2017-08-23 DIAGNOSIS — Z7902 Long term (current) use of antithrombotics/antiplatelets: Secondary | ICD-10-CM | POA: Diagnosis not present

## 2017-08-23 DIAGNOSIS — Z87891 Personal history of nicotine dependence: Secondary | ICD-10-CM | POA: Diagnosis not present

## 2017-08-23 LAB — CBC WITH DIFFERENTIAL/PLATELET
Basophils Absolute: 0 10*3/uL (ref 0.0–0.1)
Basophils Relative: 1 %
EOS PCT: 3 %
Eosinophils Absolute: 0.2 10*3/uL (ref 0.0–0.7)
HCT: 40.8 % (ref 39.0–52.0)
Hemoglobin: 13.6 g/dL (ref 13.0–17.0)
LYMPHS ABS: 2 10*3/uL (ref 0.7–4.0)
LYMPHS PCT: 30 %
MCH: 29.4 pg (ref 26.0–34.0)
MCHC: 33.3 g/dL (ref 30.0–36.0)
MCV: 88.1 fL (ref 78.0–100.0)
MONO ABS: 0.7 10*3/uL (ref 0.1–1.0)
Monocytes Relative: 11 %
Neutro Abs: 3.7 10*3/uL (ref 1.7–7.7)
Neutrophils Relative %: 55 %
PLATELETS: 224 10*3/uL (ref 150–400)
RBC: 4.63 MIL/uL (ref 4.22–5.81)
RDW: 12.7 % (ref 11.5–15.5)
WBC: 6.6 10*3/uL (ref 4.0–10.5)

## 2017-08-23 LAB — COMPREHENSIVE METABOLIC PANEL
ALT: 22 U/L (ref 17–63)
ANION GAP: 9 (ref 5–15)
AST: 19 U/L (ref 15–41)
Albumin: 4 g/dL (ref 3.5–5.0)
Alkaline Phosphatase: 61 U/L (ref 38–126)
BUN: 12 mg/dL (ref 6–20)
CHLORIDE: 104 mmol/L (ref 101–111)
CO2: 24 mmol/L (ref 22–32)
CREATININE: 0.92 mg/dL (ref 0.61–1.24)
Calcium: 9 mg/dL (ref 8.9–10.3)
Glucose, Bld: 102 mg/dL — ABNORMAL HIGH (ref 65–99)
POTASSIUM: 4 mmol/L (ref 3.5–5.1)
Sodium: 137 mmol/L (ref 135–145)
Total Bilirubin: 0.8 mg/dL (ref 0.3–1.2)
Total Protein: 6.9 g/dL (ref 6.5–8.1)

## 2017-08-23 LAB — PLATELET INHIBITION P2Y12: PLATELET FUNCTION P2Y12: 3 [PRU] — AB (ref 194–418)

## 2017-08-23 LAB — PROTIME-INR
INR: 0.96
PROTHROMBIN TIME: 12.7 s (ref 11.4–15.2)

## 2017-08-23 LAB — APTT: aPTT: 28 seconds (ref 24–36)

## 2017-08-23 LAB — GLUCOSE, CAPILLARY: Glucose-Capillary: 98 mg/dL (ref 65–99)

## 2017-08-23 NOTE — Progress Notes (Signed)
   P2y12  3 today  Takes Plavix 75 mg daily (stopped Eliquis 6 days ago--started Plavix 5 days ago) And  ASA 325 mg daily  Discussed with Dr Corliss Skainseveshwar Change ASA to 81 mg daily Continue Plavix 75 mg daily

## 2017-08-23 NOTE — Progress Notes (Addendum)
PCP is Dr. Everlene OtherBouska  LOV 07/2017  He also manages his diabetes.  Last A1C was 5.2 07/2017  Blood sugars in the am run 98-102 Cardio is Dr. Sharyn LullHarwani  LOV 05/2017  Echo done 12/18/2016.  Hx of a-fib - cardioverted x 2 Neurologist is Dr. Richardean Chimeraohmeir.  Recently tested positive for OSA -- awaiting equipement. Denies a murmur, cp currently. Eliquis has been stopped since 08/19/2017 P2y results came back as "3"   I have called and let Beckey DowningPam Turpin know.

## 2017-08-23 NOTE — Telephone Encounter (Signed)
I called pt. I advised pt that Dr. Dohmeier reviewed their sleep study results and foVickey Hugerund that pt has sleep apnea. Dr. Vickey Hugerohmeier recommends that pt starts CPAP. I reviewed PAP compliance expectations with the pt. Pt is agreeable to starting a CPAP. I advised pt that an order will be sent to a DME, [facility], and [facility] will call the pt within about one week after they file with the pt's insurance. [facility] will show the pt how to use the machine, fit for masks, and troubleshoot the CPAP if needed. A follow up appt was made for insurance purposes with Fredrik CoveRobin Hyler on Nov 04 2017 at 8:00 am [facility]. Pt verbalized understanding to arrive 15 minutes early and bring their CPAP. A letter with all of this information in it will be mailed to the pt as a reminder. I verified with the pt that the address we have on file is correct. Pt verbalized understanding of results. Pt had no questions at this time but was encouraged to call back if questions arise.

## 2017-08-23 NOTE — Telephone Encounter (Signed)
-----   Message from Melvyn Novasarmen Dohmeier, MD sent at 08/18/2017  5:28 PM EST ----- Moderate Obstructive Sleep Apnea (OSA), with a general AHI of  17.4/hr., REM AHI of 23.7/hr. Supine AHI of 47.5/hr.  DIAGNOSIS: Obstructive Sleep Apnea, responsive to CPAP at 12 cm  water pressure with 1 cm EPR. 1. The patient was fitted with a Simplus FFM in small - a Fisher  Paykel product/apparatus.  Cc Dr Everlene OtherBouska

## 2017-08-24 ENCOUNTER — Other Ambulatory Visit: Payer: Self-pay | Admitting: General Surgery

## 2017-08-24 NOTE — Progress Notes (Signed)
Anesthesia Chart Review: Patient is a 56 year old male scheduled for cerebral arteriogram with possible cerebral artery aneurysm embolization on 08/26/17 by Dr. Julieanne CottonSanjeev Deveshwar. 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 He has been transitioned from Eliquis to Plavix for these procedures). Reportedly on 08/19/17 for this procedure.   History includes former smoker (quit '15), post-operative N/V, afib (s/p DCCV 12/18/16 and 01/12/17), hiatal hernia, GERD, DM2, HTN, HLD, CVA 06/2014, CKD, COPD, asthma, Bipolar disorder, depression, chronic back pain, moderate OSA (CPAP recommended 08/23/17). Has glasses and dentures. 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 OSA screening score was 7.  PCP is listed as Dr. Tracey Harriesavid Bouska. Cardiologist is Dr. Rinaldo CloudMohan Harwani. Last office note not yet received. (By IR notes, was seen after his 06/2017 procedure. June notes scanned under the Media tab.)   Meds include albuterol, amiodarone, Eliquis (on hold while on Plavix/ASA), ASA (decreased from 325 mg to 81 mg 08/24/17), Lipitor, Nexium, Plavix, Neurontin, metformin, Nitro, Anoro Ellipta.  BP 121/61   Pulse (!) 50   Temp 36.5 C (Oral)   Resp 20   Ht 5\' 8"  (1.727 m)   Wt 252 lb (114.3 kg)   SpO2 97%   BMI 38.32 kg/m   EKG 02/05/17 (Dr. Sharyn LullHarwani; scanned under Media tab, Correspondence, 05/10/17): SB at 53 bpm, LAD, incomplete right BBB, voltage criteria for LVH. Baseline wanderer in multiple leads, worse in I, II.   TEE (done with DCCV) 12/18/16: Study Conclusions - Left ventricle: Systolic function was normal. Wall motion was normal; there were no regional wall motion abnormalities. - Mitral valve: There was mild  regurgitation directed centrally. - Left atrium: No evidence of thrombus in the atrial cavity or appendage. - Right atrium: No evidence of thrombus in the atrial cavity or appendage. - Atrial septum: There was a patent foramen ovale by sonicated saline injection and pressure on abdomen. IMPRESSION: 1. Normal LV systolic function. 2. PFO by saline injection. 3. No clot in LA or appendage RECOMMENDATIONS: Patient underwent 120 J biphasic shock x one with successful cardioversion. IV amiodarone resumed.  Nuclear stress test 04/03/16: IMPRESSION: 1. Possible inferior wall scar. No evidence of ischemia. 2. Mild hypokinesis, most marked in the inferior wall. 3. Left ventricular ejection fraction 46% (previously 43% on 02/03/2010). 4. Intermediate-risk stress test findings*.  CTA head/neck 04/08/17: IMPRESSION: 1. The right carotid bifurcation stent is patent. 2. Slight decreased size of the cavernous internal carotid artery on the left may reflect some flow limitation on the right. 3. 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 4. Left M1 segment and MCA bifurcation stent and aneurysm coils without evidence for residual or recurrent aneurysm or distal occlusion. 5. Stable mm right carotid bifurcation aneurysm. 6. 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  CXR 01/12/17: IMPRESSION:  No active cardiopulmonary disease.  Preoperative labs noted. P2y12 3 (switched from Eliquis to Plavix and ASA 08/19/17). Dr. Corliss Skainseveshwar aware. ASA was decreased to 81 mg, but patient to continue Plavix. There is a p2y12 order in Epic per IR.  If no acute changes and p2y12 results within acceptable range then I would anticipate that he could proceed as planned.   Shonna ChockAllison Stillman Buenger, PA-C [facility] Short [facility]/Anesthesiology Phone (  [phone] 08/24/2017 4:35 PM

## 2017-08-25 ENCOUNTER — Encounter (HOSPITAL_COMMUNITY): Payer: Self-pay | Admitting: Anesthesiology

## 2017-08-26 ENCOUNTER — Encounter (HOSPITAL_COMMUNITY): Payer: Self-pay | Admitting: *Deleted

## 2017-08-26 ENCOUNTER — Ambulatory Visit (HOSPITAL_COMMUNITY)
Admission: RE | Admit: 2017-08-26 | Discharge: 2017-08-26 | Disposition: A | Discharge status: Home / Self Care | Payer: Medicare Other | Source: Ambulatory Visit | Attending: Interventional Radiology | Admitting: Interventional Radiology

## 2017-08-26 ENCOUNTER — Encounter (HOSPITAL_COMMUNITY)
Admission: RE | Disposition: A | Discharge status: Home / Self Care | Payer: Self-pay | Source: Ambulatory Visit | Attending: Interventional Radiology

## 2017-08-26 ENCOUNTER — Encounter (HOSPITAL_COMMUNITY): Payer: Self-pay

## 2017-08-26 DIAGNOSIS — E1122 Type 2 diabetes mellitus with diabetic chronic kidney disease: Secondary | ICD-10-CM | POA: Insufficient documentation

## 2017-08-26 DIAGNOSIS — Z7902 Long term (current) use of antithrombotics/antiplatelets: Secondary | ICD-10-CM | POA: Insufficient documentation

## 2017-08-26 DIAGNOSIS — J449 Chronic obstructive pulmonary disease, unspecified: Secondary | ICD-10-CM | POA: Insufficient documentation

## 2017-08-26 DIAGNOSIS — Z87891 Personal history of nicotine dependence: Secondary | ICD-10-CM | POA: Insufficient documentation

## 2017-08-26 DIAGNOSIS — E785 Hyperlipidemia, unspecified: Secondary | ICD-10-CM | POA: Insufficient documentation

## 2017-08-26 DIAGNOSIS — Z79899 Other long term (current) drug therapy: Secondary | ICD-10-CM | POA: Insufficient documentation

## 2017-08-26 DIAGNOSIS — Z7984 Long term (current) use of oral hypoglycemic drugs: Secondary | ICD-10-CM | POA: Insufficient documentation

## 2017-08-26 DIAGNOSIS — Z538 Procedure and treatment not carried out for other reasons: Secondary | ICD-10-CM | POA: Insufficient documentation

## 2017-08-26 DIAGNOSIS — Z7901 Long term (current) use of anticoagulants: Secondary | ICD-10-CM | POA: Insufficient documentation

## 2017-08-26 DIAGNOSIS — I129 Hypertensive chronic kidney disease with stage 1 through stage 4 chronic kidney disease, or unspecified chronic kidney disease: Secondary | ICD-10-CM | POA: Insufficient documentation

## 2017-08-26 DIAGNOSIS — Z8673 Personal history of transient ischemic attack (TIA), and cerebral infarction without residual deficits: Secondary | ICD-10-CM | POA: Insufficient documentation

## 2017-08-26 DIAGNOSIS — G4733 Obstructive sleep apnea (adult) (pediatric): Secondary | ICD-10-CM | POA: Insufficient documentation

## 2017-08-26 DIAGNOSIS — I671 Cerebral aneurysm, nonruptured: Secondary | ICD-10-CM | POA: Diagnosis not present

## 2017-08-26 DIAGNOSIS — K219 Gastro-esophageal reflux disease without esophagitis: Secondary | ICD-10-CM | POA: Insufficient documentation

## 2017-08-26 DIAGNOSIS — N189 Chronic kidney disease, unspecified: Secondary | ICD-10-CM | POA: Insufficient documentation

## 2017-08-26 DIAGNOSIS — Z7982 Long term (current) use of aspirin: Secondary | ICD-10-CM | POA: Insufficient documentation

## 2017-08-26 DIAGNOSIS — F319 Bipolar disorder, unspecified: Secondary | ICD-10-CM | POA: Insufficient documentation

## 2017-08-26 HISTORY — PX: RADIOLOGY WITH ANESTHESIA: SHX6223

## 2017-08-26 LAB — GLUCOSE, CAPILLARY: GLUCOSE-CAPILLARY: 135 mg/dL — AB (ref 65–99)

## 2017-08-26 LAB — PLATELET INHIBITION P2Y12: Platelet Function  P2Y12: 5 [PRU] — ABNORMAL LOW (ref 194–418)

## 2017-08-26 SURGERY — RADIOLOGY WITH ANESTHESIA
Anesthesia: General

## 2017-08-26 MED ORDER — ASPIRIN EC 325 MG PO TBEC
325.0000 mg | DELAYED_RELEASE_TABLET | ORAL | Status: DC
  Filled 2017-08-26: qty 1

## 2017-08-26 MED ORDER — SODIUM CHLORIDE 0.9 % IV SOLN: INTRAVENOUS | Status: DC

## 2017-08-26 MED ORDER — IOPAMIDOL (ISOVUE-300) INJECTION 61%
INTRAVENOUS | Status: AC
  Filled 2017-08-26: qty 150

## 2017-08-26 MED ORDER — CLOPIDOGREL BISULFATE 75 MG PO TABS: 75.0000 mg | ORAL_TABLET | ORAL | Status: DC

## 2017-08-26 MED ORDER — NIMODIPINE 30 MG PO CAPS: 0.0000 mg | ORAL_CAPSULE | ORAL | Status: DC

## 2017-08-26 MED ORDER — ASPIRIN EC 81 MG PO TBEC: 81.0000 mg | DELAYED_RELEASE_TABLET | Freq: Every day | ORAL | Status: DC

## 2017-08-26 MED ORDER — CEFAZOLIN SODIUM-DEXTROSE 2-4 GM/100ML-% IV SOLN
2.0000 g | INTRAVENOUS | Status: DC
  Filled 2017-08-26: qty 100

## 2017-08-26 MED ORDER — CLOPIDOGREL BISULFATE 75 MG PO TABS: 37.5000 mg | ORAL_TABLET | Freq: Every day | ORAL | Status: DC

## 2017-08-26 NOTE — Progress Notes (Signed)
P2Y12 despite changes 3 days ago remains at 5.  We will cut his plavix in half to 37.5mg  a day, ASA 81mg , and cont to hold his eliquis.  He will be rescheduled for his intervention.    Kasia Trego E 9:05 AM 08/26/2017

## 2017-08-27 ENCOUNTER — Encounter (HOSPITAL_COMMUNITY): Payer: Self-pay | Admitting: Interventional Radiology

## 2017-09-08 ENCOUNTER — Other Ambulatory Visit (HOSPITAL_COMMUNITY): Payer: Self-pay | Admitting: Radiology

## 2017-09-08 DIAGNOSIS — I729 Aneurysm of unspecified site: Secondary | ICD-10-CM

## 2017-09-08 LAB — PLATELET INHIBITION P2Y12: PLATELET FUNCTION P2Y12: 3 [PRU] — AB (ref 194–418)

## 2017-09-13 ENCOUNTER — Other Ambulatory Visit (HOSPITAL_COMMUNITY): Payer: Self-pay | Admitting: *Deleted

## 2017-09-13 NOTE — Progress Notes (Signed)
Outgoing call to pt regarding p2y12 results. Advised pt to continue taking ASA 81mg  daily and change plavix to 37.5mg  QOD. Pt verbalized understanding.

## 2017-09-15 ENCOUNTER — Telehealth (HOSPITAL_COMMUNITY): Payer: Self-pay

## 2017-09-21 ENCOUNTER — Telehealth (HOSPITAL_COMMUNITY): Payer: Self-pay | Admitting: Radiology

## 2017-09-21 ENCOUNTER — Telehealth (HOSPITAL_COMMUNITY): Payer: Self-pay

## 2017-09-21 NOTE — Telephone Encounter (Signed)
Called pt and [facility] to [facility] or 09/24/17 for a P2Y12 recheck. Pt agrees JM

## 2017-09-21 NOTE — Telephone Encounter (Signed)
Left message for pt to return call.

## 2017-09-24 ENCOUNTER — Other Ambulatory Visit (HOSPITAL_COMMUNITY): Payer: Self-pay | Admitting: Radiology

## 2017-09-24 DIAGNOSIS — I729 Aneurysm of unspecified site: Secondary | ICD-10-CM

## 2017-09-24 LAB — PLATELET INHIBITION P2Y12: Platelet Function  P2Y12: 81 [PRU] — ABNORMAL LOW (ref 194–418)

## 2017-09-29 ENCOUNTER — Telehealth (HOSPITAL_COMMUNITY): Payer: Self-pay | Admitting: *Deleted

## 2017-09-29 NOTE — Telephone Encounter (Signed)
Called and left message, Per Dr. Corliss Skainseveshwar patient is to continue taking current dose of medication Plavix 37.5mg  and ASA 81 mg daily

## 2017-10-13 ENCOUNTER — Other Ambulatory Visit: Payer: Self-pay

## 2017-10-13 DIAGNOSIS — I824Z3 Acute embolism and thrombosis of unspecified deep veins of distal lower extremity, bilateral: Secondary | ICD-10-CM

## 2017-10-13 DIAGNOSIS — M7989 Other specified soft tissue disorders: Secondary | ICD-10-CM

## 2017-10-15 ENCOUNTER — Other Ambulatory Visit: Payer: Self-pay | Admitting: Cardiology

## 2017-10-15 DIAGNOSIS — R079 Chest pain, unspecified: Secondary | ICD-10-CM

## 2017-10-27 ENCOUNTER — Ambulatory Visit (HOSPITAL_COMMUNITY)
Admission: RE | Admit: 2017-10-27 | Discharge: 2017-10-27 | Disposition: A | Discharge status: Home / Self Care | Payer: Medicare Other | Source: Ambulatory Visit | Attending: Cardiology | Admitting: Cardiology

## 2017-10-27 ENCOUNTER — Ambulatory Visit (INDEPENDENT_AMBULATORY_CARE_PROVIDER_SITE_OTHER): Payer: Medicare Other | Admitting: Vascular Surgery

## 2017-10-27 ENCOUNTER — Encounter: Payer: Self-pay | Admitting: Vascular Surgery

## 2017-10-27 ENCOUNTER — Other Ambulatory Visit (HOSPITAL_COMMUNITY)
Admission: RE | Admit: 2017-10-27 | Discharge: 2017-10-27 | Disposition: A | Discharge status: Home / Self Care | Payer: Medicare Other | Source: Ambulatory Visit | Attending: Cardiology | Admitting: Cardiology

## 2017-10-27 VITALS — BP 123/78 | HR 59 | Temp 98.0°F | Resp 20 | Ht 68.0 in | Wt 247.0 lb

## 2017-10-27 DIAGNOSIS — R079 Chest pain, unspecified: Secondary | ICD-10-CM

## 2017-10-27 DIAGNOSIS — I824Z3 Acute embolism and thrombosis of unspecified deep veins of distal lower extremity, bilateral: Secondary | ICD-10-CM | POA: Diagnosis not present

## 2017-10-27 DIAGNOSIS — M7989 Other specified soft tissue disorders: Secondary | ICD-10-CM | POA: Diagnosis not present

## 2017-10-27 DIAGNOSIS — I872 Venous insufficiency (chronic) (peripheral): Secondary | ICD-10-CM

## 2017-10-27 LAB — HEPATIC FUNCTION PANEL
ALT: 24 U/L (ref 17–63)
AST: 20 U/L (ref 15–41)
Albumin: 4 g/dL (ref 3.5–5.0)
Alkaline Phosphatase: 62 U/L (ref 38–126)
BILIRUBIN TOTAL: 0.8 mg/dL (ref 0.3–1.2)
Total Protein: 7 g/dL (ref 6.5–8.1)

## 2017-10-27 LAB — LIPID PANEL
Cholesterol: 125 mg/dL (ref 0–200)
HDL: 38 mg/dL — ABNORMAL LOW (ref >40)
LDL CALC: 76 mg/dL (ref 0–99)
Total CHOL/HDL Ratio: 3.3 RATIO
Triglycerides: 57 mg/dL (ref <150)
VLDL: 11 mg/dL (ref 0–40)

## 2017-10-27 LAB — BASIC METABOLIC PANEL
Anion gap: 9 (ref 5–15)
BUN: 13 mg/dL (ref 6–20)
CALCIUM: 9.2 mg/dL (ref 8.9–10.3)
CO2: 27 mmol/L (ref 22–32)
CREATININE: 0.95 mg/dL (ref 0.61–1.24)
Chloride: 103 mmol/L (ref 101–111)
GFR calc Af Amer: >60 mL/min (ref >60)
GFR calc non Af Amer: >60 mL/min (ref >60)
GLUCOSE: 114 mg/dL — AB (ref 65–99)
Potassium: 4.7 mmol/L (ref 3.5–5.1)
Sodium: 139 mmol/L (ref 135–145)

## 2017-10-27 LAB — HEMOGLOBIN A1C
HEMOGLOBIN A1C: 5.2 % (ref 4.8–5.6)
MEAN PLASMA GLUCOSE: 102.54 mg/dL

## 2017-10-27 LAB — TSH: TSH: 1.352 u[IU]/mL (ref 0.350–4.500)

## 2017-10-27 MED ORDER — REGADENOSON 0.4 MG/5ML IV SOLN
0.4000 mg | Freq: Once | INTRAVENOUS | Status: AC
  Administered 2017-10-27: 0.4 mg via INTRAVENOUS

## 2017-10-27 MED ORDER — TECHNETIUM TC 99M TETROFOSMIN IV KIT
30.0000 | PACK | Freq: Once | INTRAVENOUS | Status: AC | PRN
  Administered 2017-10-27: 30 via INTRAVENOUS

## 2017-10-27 MED ORDER — REGADENOSON 0.4 MG/5ML IV SOLN
INTRAVENOUS | Status: AC
  Administered 2017-10-27: 0.4 mg via INTRAVENOUS
  Filled 2017-10-27: qty 5

## 2017-10-27 MED ORDER — TECHNETIUM TC 99M TETROFOSMIN IV KIT
10.0000 | PACK | Freq: Once | INTRAVENOUS | Status: AC | PRN
  Administered 2017-10-27: 10 via INTRAVENOUS

## 2017-10-27 NOTE — Progress Notes (Signed)
Patient name: [name] MRN: [id number] DOB: 1961/04/23 Sex: male   REASON FOR CONSULT:    Bilateral lower extremity swelling with varicose veins.  Patient is self-referred.  HPI:   [name] is a pleasant 57 y.o. male, who presents with complaints of aching heaviness in both lower extremities which is aggravated by standing and relieved somewhat with elevation.  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  [place] and [place] also on his feet quite a bit.  He is unaware of any previous history of DVT or phlebitis.  He has had bilateral leg swelling.  I do not get any history of claudication, rest pain, or nonhealing ulcers.  His risk factors for peripheral vascular disease include type 2 diabetes, hypertension, hypercholesterolemia, a family history of premature cardiovascular disease, and a remote history of tobacco use.  He does have some issues with neck pain and back pain.  He experiences some pain that radiates down his right arm and also some pain which radiates down his right leg from his back.  He denies any chest pain or shortness of breath.  Past Medical History:  Diagnosis Date  . A-fib (HCC)   . Aneurysm (HCC)    brain  . Arthritis   . Atrial fibrillation (HCC)   . Bipolar disorder (HCC)    was on meds but was taken off 2 yrs ago and none since  . Burning pain    in both legs-seeing Dr.Sethi for this  . CAD (coronary artery disease)   . Cataracts, bilateral   . Childhood asthma    Last asthma attack at age 50; History of trach at 16 months (12/17/2016)  . Chronic kidney disease 2009  . Chronic lower back pain   . COPD (chronic obstructive pulmonary disease) (HCC) 06/2012   uses Spiriva and Albuterol daily as needed  . Depression   . DVT (deep venous thrombosis) (HCC)   . Family history of adverse reaction to anesthesia    "Mom and sister have PONV"  . GERD (gastroesophageal reflux disease)   .  H/O hiatal hernia   . Headache(784.0)    "q 2-3 weeks; daily last 2 wks" (12/17/2016)  . Hyperlipidemia    takes Ramipril daily  . Hypertension    takes Ramipril daily  . Joint pain   . Joint swelling   . Middle cerebral artery aneurysm    right  . Nocturia   . Numbness    both arms   . Pneumonia    hx of-2014  . PONV (postoperative nausea and vomiting)    Pt reports nausea only.  . Sleep apnea    dx just this past week   08/21/2017  . Stroke [facility]) 2015   "drag left foot more since; have to wear glasses now" (12/17/2016)  . Type II diabetes mellitus (HCC)    takes Metformin daily  . Umbilical hernia   . Urinary frequency   . Wears dentures   . Wears glasses    and contact lenses    Family History  Problem Relation Age of Onset  . Heart disease Mother        s/p 3V CABG  . Cancer Father 34       lung cancer; +tobacco  . Stroke Father   . Cancer Maternal Grandmother   . Heart disease Maternal Grandmother   . Cancer Paternal Grandmother   .  Lupus Sister   . Multiple sclerosis Sister   . Anemia Daughter   His mother had heart disease at a young age.   SOCIAL HISTORY: He quit tobacco in 2015. Social History   Socioeconomic History  . Marital status: Divorced    Spouse name: not together since 2007  . Number of children: 3  . Years of education: 10  . Highest education level: Not on file  Social Needs  . Financial resource strain: Not on file  . Food insecurity - worry: Not on file  . Food insecurity - inability: Not on file  . Transportation needs - medical: Not on file  . Transportation needs - non-medical: Not on file  Occupational History  . Occupation: Corporate treasurerield Technician    Employer: [employer]    Comment: airport   Tobacco Use  . Smoking status: Former Smoker    Packs/day: 1.50    Years: 25.00    Pack years: 37.50    Types: Cigarettes    Start date: 01/15/1975    Last attempt to quit: 06/05/2014    Years since quitting: 3.3  . Smokeless tobacco: Never  Used  Substance and Sexual Activity  . Alcohol use: Yes    Alcohol/week: 1.8 oz    Types: 3 Cans of beer per week    Comment: none since Jun 10, 2017  . Drug use: No  . Sexual activity: No    Partners: Female    Comment: partner has had surgery to prevent pregnancy  Other Topics Concern  . Not on file  Social History Narrative   Lives with his son and his mother.  His son has no contact with the son's mother, though the patient is not legally separated from her.  She has a history of drug use and has been in prison several times.    Allergies  Allergen Reactions  . Tylox [Oxycodone-Acetaminophen] Hives, Other (See Comments) and Rash    Sweating, shaking Reaction: Tremors and diaphoresis   . Adhesive [Tape] Rash    Current Outpatient Medications  Medication Sig Dispense Refill  . albuterol (PROVENTIL HFA;VENTOLIN HFA) 108 (90 BASE) MCG/ACT inhaler Inhale 2 puffs into the lungs every 6 (six) hours as needed for wheezing or shortness of breath.     [name]. amiodarone (PACERONE) 200 MG tablet Take 1 tablet (200 mg total) by mouth daily. 30 tablet 1  . aspirin EC 81 MG tablet Take 1 tablet (81 mg total) daily by mouth.    [name]. atorvastatin (LIPITOR) 20 MG tablet Take 1 tablet (20 mg total) by mouth daily at 6 PM. (Patient taking differently: Take 20 mg by mouth every evening. ) 30 tablet 0  . clopidogrel (PLAVIX) 75 MG tablet Take 0.5 tablets (37.5 mg total) daily by mouth.    . furosemide (LASIX) 40 MG tablet TK 1 T PO D  3  . gabapentin (NEURONTIN) 100 MG capsule TAKE 1 CAPSULE BY MOUTH THREE TIMES DAILY 270 capsule 3  . hydrochlorothiazide (HYDRODIURIL) 25 MG tablet Take 0.5 tablets (12.5 mg total) by mouth daily.    . metFORMIN (GLUCOPHAGE) 500 MG tablet Take 1 tablet (500 mg total) by mouth 2 (two) times daily with a meal. 60 tablet 0  . nitroGLYCERIN (NITROSTAT) 0.4 MG SL tablet Place 1 tablet (0.4 mg total) under the tongue every 5 (five) minutes x 3 doses as needed for chest pain. 25  tablet 1  . potassium chloride (K-DUR) 10 MEQ tablet TK 1 T PO D  3  . umeclidinium-vilanterol (ANORO ELLIPTA) 62.5-25 MCG/INH AEPB Inhale 1 puff into the lungs daily.     [name] apixaban (ELIQUIS) 5 MG TABS tablet     . esomeprazole (NEXIUM) 40 MG capsule Take 40 mg by mouth at bedtime as needed (for acid reflux).      No current facility-administered medications for this visit.     REVIEW OF SYSTEMS:  [X]  denotes positive finding, [ ]  denotes negative finding Cardiac  Comments:  Chest pain or chest pressure:    Shortness of breath upon exertion: x   Short of breath when lying flat: x   Irregular heart rhythm:        Vascular    Pain in calf, thigh, or hip brought on by ambulation:    Pain in feet at night that wakes you up from your sleep:     Blood clot in your veins:    Leg swelling:  x       Pulmonary    Oxygen at home:    Productive cough:  x   Wheezing:  x       Neurologic    Sudden weakness in arms or legs:     Sudden numbness in arms or legs:     Sudden onset of difficulty speaking or slurred speech:    Temporary loss of vision in one eye:     Problems with dizziness:         Gastrointestinal    Blood in stool:     Vomited blood:         Genitourinary    Burning when urinating:     Blood in urine:        Psychiatric    Major depression:  x       Hematologic    Bleeding problems:    Problems with blood clotting too easily:        Skin    Rashes or ulcers: X       Constitutional    Fever or chills:     PHYSICAL EXAM:   Vitals:   10/27/17 1551  BP: 123/78  Pulse: (!) 59  Resp: 20  Temp: 98 F (36.7 C)  TempSrc: Oral  SpO2: 96%  Weight: 247 lb (112 kg)  Height: 5\' 8"  (1.727 m)    GENERAL: The patient is a well-nourished male, in no acute distress. The vital signs are documented above. CARDIAC: There is a regular rate and rhythm.  VASCULAR: I do not detect carotid bruits. He has palpable femoral, popliteal, dorsalis pedis, and posterior tibial  pulses bilaterally. He has hyperpigmentation bilaterally consistent with chronic venous insufficiency. He has mild bilateral lower extremity swelling PULMONARY: There is good air exchange bilaterally without wheezing or rales. ABDOMEN: Soft and non-tender with normal pitched bowel sounds.  I cannot palpate an abdominal aortic aneurysm. MUSCULOSKELETAL: There are no major deformities or cyanosis. NEUROLOGIC: No focal weakness or paresthesias are detected. SKIN: There are no ulcers or rashes noted.  He has telangiectasias and reticular veins in both calves. PSYCHIATRIC: The patient has a normal affect.  DATA:    VENOUS DUPLEX:  I have independently interpreted his bilateral lower extremity venous duplex scan.  The patient has no evidence of DVT or superficial thrombophlebitis in either leg.  A formal reflux study was not done.  MEDICAL ISSUES:   CHRONIC VENOUS INSUFFICIENCY: Based on his exam and history he has evidence of chronic venous insufficiency. This is CEAP C-4 disease.  We have  discussed the importance of intermittent leg elevation and the proper positioning for this.  In addition I have written him a prescription for knee-high compression stockings with a gradient of 15-20 mmHg.  I have encouraged him to avoid prolonged sitting and standing.  I have encouraged him to exercise.  We also discussed water aerobics which I think is very helpful for patients with chronic venous insufficiency.  If his symptoms progressed and certainly we could get a formal reflux study.  He will call if he develops worsening symptoms.  Waverly Ferrari [facility] [phone]

## 2017-11-04 ENCOUNTER — Ambulatory Visit: Payer: Self-pay

## 2017-11-11 ENCOUNTER — Other Ambulatory Visit: Payer: Self-pay | Admitting: Radiology

## 2017-11-12 NOTE — Pre-Procedure Instructions (Addendum)
[name]  11/12/2017      [id number] - [name], [place] - [address] AT [facility] OF [facility] [address] [place] [address] Phone: [phone] Fax: [phone]    Your procedure is scheduled on Feb 11.  Report to [facility] Admitting at 630 A.M.  Call this number if you have problems the morning of surgery:  [phone]   Remember:  Do not eat food or drink liquids after midnight.  Take these medicines the morning of surgery with A SIP OF WATER Albuterol inhaler if needed- bring your inhalers with you on the day of surgery, amiodarone (Pacerone), Aspirin and plavix  (as directed by your Dr), Gabapentin (Neurontin), Nitro stat if needed, Umecildinium- vilanterol (Anoro Ellipta)   Stop taking BC's, Goody's, Herbal medications, Vitamins, Fish Oil, Ibuprofen, Advil, Motrin, Aleve     How to Manage Your Diabetes Before and After Surgery  Why is it important to control my blood sugar before and after surgery? . Improving blood sugar levels before and after surgery helps healing and can limit problems. . A way of improving blood sugar control is eating a healthy diet by: o  Eating less sugar and carbohydrates o  Increasing activity/exercise o  Talking with your doctor about reaching your blood sugar goals . High blood sugars (greater than 180 mg/dL) can raise your risk of infections and slow your recovery, so you will need to focus on controlling your diabetes during the weeks before surgery. . Make sure that the doctor who takes care of your diabetes knows about your planned surgery including the date and location.  How do I manage my blood sugar before surgery? . Check your blood sugar at least 4 times a day, starting 2 days before surgery, to make sure that the level is not too high or low. o Check your blood sugar the morning of your surgery when you wake up and every 2 hours until you get to the Short Stay  unit. . If your blood sugar is less than 70 mg/dL, you will need to treat for low blood sugar: o Do not take insulin. o Treat a low blood sugar (less than 70 mg/dL) with  cup of clear juice (cranberry or apple), 4 glucose tablets, OR glucose gel. Recheck blood sugar in 15 minutes after treatment (to make sure it is greater than 70 mg/dL). If your blood sugar is not greater than 70 mg/dL on recheck, call [phone] o  for further instructions. . Report your blood sugar to the short stay nurse when you get to Short Stay.  . If you are admitted to the hospital after surgery: o Your blood sugar will be checked by the staff and you will probably be given insulin after surgery (instead of oral diabetes medicines) to make sure you have good blood sugar levels. o The goal for blood sugar control after surgery is 80-180 mg/dL.              WHAT DO I DO ABOUT MY DIABETES MEDICATION?   [name] Do not take oral diabetes medicines (pills) the morning of surgery. Metformin (Glucophage)      . The day of surgery, do not take other diabetes injectables, including Byetta (exenatide), Bydureon (exenatide ER), Victoza (liraglutide), or Trulicity (dulaglutide).  . If your CBG is greater than 220 mg/dL, you may take  of your sliding scale (correction) dose of insulin.  Other Instructions:  Patient Signature:  Date:   Nurse Signature:  Date:   Reviewed and Endorsed by [facility] Patient Education Committee, August 2015  Do not wear jewelry, make-up or nail polish.  Do not wear lotions, powders, or perfumes, or deodorant.  Do not shave 48 hours prior to surgery.  Men may shave face and neck.  Do not bring valuables to the hospital.  [facility] is not responsible for any belongings or valuables.  Contacts, dentures or bridgework may not be worn into surgery.  Leave your suitcase in the car.  After surgery it may be brought to your room.  For patients admitted to the hospital,  discharge time will be determined by your treatment team.  Patients discharged the day of surgery will not be allowed to drive home.   Special instructions:  [facility] - Preparing for Surgery  Before surgery, you can play an important role.  Because skin is not sterile, your skin needs to be as free of germs as possible.  You can reduce the number of germs on you skin by washing with CHG (chlorahexidine gluconate) soap before surgery.  CHG is an antiseptic cleaner which kills germs and bonds with the skin to continue killing germs even after washing.  Please DO NOT use if you have an allergy to CHG or antibacterial soaps.  If your skin becomes reddened/irritated stop using the CHG and inform your nurse when you arrive at Short Stay.  Do not shave (including legs and underarms) for at least 48 hours prior to the first CHG shower.  You may shave your face.  Please follow these instructions carefully:   1.  Shower with CHG Soap the night before surgery and the   morning of Surgery.  2.  If you choose to wash your hair, wash your hair first as usual with your normal shampoo.  3.  After you shampoo, rinse your hair and body thoroughly to remove the Shampoo.  4.  Use CHG as you would any other liquid soap.  You can apply chg directly to the skin and wash gently with scrungie or a clean washcloth.  5.  Apply the CHG Soap to your body ONLY FROM THE NECK DOWN.  Do not use on open wounds or open sores.  Avoid contact with your eyes,       ears, mouth and genitals (private parts).  Wash genitals (private parts)with your normal soap.  6.  Wash thoroughly, paying special attention to the area where your surgery  will be performed.  7.  Thoroughly rinse your body with warm water from the neck down.  8.  DO NOT shower/wash with your normal soap after using and rinsing off  the CHG Soap.  9.  Pat yourself dry with a clean towel.            10.  Wear clean pajamas.            11.  Place clean sheets on your  bed the night of your first shower and do not sleep with pets.  Day of Surgery  Do not apply any lotions/deoderants the morning of surgery.  Please wear clean clothes to the hospital/surgery center.     Please read over the following fact sheets that you were given. Pain Booklet, Coughing and Deep Breathing and Surgical Site Infection Prevention

## 2017-11-12 NOTE — Pre-Procedure Instructions (Signed)
[name]  11/12/2017      [facility] - [name], [place] - [address] AT [facility] OF [facility] & [address] [place] [address] Phone: [phone] Fax: [phone]    Your procedure is scheduled on Monday, Feb. 11th                           Report to [facility] Admitting at 630 A.M.             (posted surgery 8:30a - 10:30a)   Call this number if you have problems the morning of surgery:  [phone]   Remember:              4-5 days prior to surgery, STOP TAKING any Vitamins, Herbal Supplements, Anti-inflammatories.   Do not eat food or drink liquids after midnight, Sunday.   Take these medicines the morning of surgery with A SIP OF WATER:  Albuterol inhaler if needed- bring your inhalers with you on the day of surgery, amiodarone (Pacerone), Aspirin and plavix  (as directed by your Dr), Gabapentin (Neurontin), Nitro stat if needed, Umecildinium- vilanterol (Anoro Ellipta)   Stop taking BC's, Goody's, Herbal medications, Vitamins, Fish Oil, Ibuprofen, Advil, Motrin, Aleve     How to Manage Your Diabetes Before and After Surgery  Why is it important to control my blood sugar before and after surgery? . Improving blood sugar levels before and after surgery helps healing and can limit problems. . A way of improving blood sugar control is eating a healthy diet by: o  Eating less sugar and carbohydrates o  Increasing activity/exercise o  Talking with your doctor about reaching your blood sugar goals . High blood sugars (greater than 180 mg/dL) can raise your risk of infections and slow your recovery, so you will need to focus on controlling your diabetes during the weeks before surgery. . Make sure that the doctor who takes care of your diabetes knows about your planned surgery including the date and location.  How do I manage my blood sugar before surgery? . Check your blood sugar at least 4 times a day,  starting 2 days before surgery, to make sure that the level is not too high or low. o Check your blood sugar the morning of your surgery when you wake up and every 2 hours until you get to the Short Stay unit. o  . If your blood sugar is less than 70 mg/dL, you will need to treat for low blood sugar: o Do not take insulin. o Treat a low blood sugar (less than 70 mg/dL) with  cup of clear juice (cranberry or apple), 4 glucose tablets, OR glucose gel. o  Recheck blood sugar in 15 minutes after treatment (to make sure it is greater than 70 mg/dL). If your blood sugar is not greater than 70 mg/dL on recheck, call [phone] o  for further instructions. . Report your blood sugar to the short stay nurse when you get to Short Stay.  . If you are admitted to the hospital after surgery: o Your blood sugar will be checked by the staff and you will probably be given insulin after surgery (instead of oral diabetes medicines) to make sure you have good blood sugar levels. o The goal for blood sugar control after surgery is 80-180 mg/dL.  WHAT DO I DO ABOUT MY DIABETES MEDICATION?  [name] Do not take  oral diabetes medicines (pills) the morning of surgery. Metformin (Glucophage)      . The day of surgery, do not take other diabetes injectables, including Byetta (exenatide), Bydureon (exenatide ER), Victoza (liraglutide), or Trulicity (dulaglutide).  . If your CBG is greater than 220 mg/dL, you may take  of your sliding scale (correction) dose of insulin.  Other Instructions:          Patient Signature:  Date:   Nurse Signature:  Date:   Reviewed and Endorsed by [facility] Patient Education Committee, August 2015    Do not wear jewelry, make-up or nail polish.  Do not wear lotions, powders, or perfumes, or deodorant.  Do not shave 48 hours prior to surgery.  Men may shave face and neck.  Do not bring valuables to the hospital.  [facility] is not responsible for any belongings or  valuables.  Contacts, dentures or bridgework may not be worn into surgery.  Leave your suitcase in the car.  After surgery it may be brought to your room.  For patients admitted to the hospital, discharge time will be determined by your treatment team.  Patients discharged the day of surgery will not be allowed to drive home.   Special instructions:  [place] - Preparing for Surgery  Before surgery, you can play an important role.  Because skin is not sterile, your skin needs to be as free of germs as possible.  You can reduce the number of germs on you skin by washing with CHG (chlorahexidine gluconate) soap before surgery.  CHG is an antiseptic cleaner which kills germs and bonds with the skin to continue killing germs even after washing.  Please DO NOT use if you have an allergy to CHG or antibacterial soaps.  If your skin becomes reddened/irritated stop using the CHG and inform your nurse when you arrive at Short Stay.  Do not shave (including legs and underarms) for at least 48 hours prior to the first CHG shower.  You may shave your face.  Please follow these instructions carefully:   1.  Shower with CHG Soap the night before surgery and the   morning of Surgery.  2.  If you choose to wash your hair, wash your hair first as usual with your normal shampoo.  3.  After you shampoo, rinse your hair and body thoroughly to remove the Shampoo.  4.  Use CHG as you would any other liquid soap.  You can apply chg directly to the skin and wash gently with scrungie or a clean washcloth.  5.  Apply the CHG Soap to your body ONLY FROM THE NECK DOWN.  Do not use on open wounds or open sores.  Avoid contact with your eyes,       ears, mouth and genitals (private parts).  Wash genitals (private parts)with your normal soap.  6.  Wash thoroughly, paying special attention to the area where your surgery  will be performed.  7.  Thoroughly rinse your body with warm water from the neck down.  8.  DO NOT  shower/wash with your normal soap after using and rinsing off  the CHG Soap.  9.  Pat yourself dry with a clean towel.            10.  Wear clean pajamas.            11.  Place clean sheets on your bed the night of your first shower and do not sleep with pets.  Day  of Surgery  Do not apply any lotions/deoderants the morning of surgery.  Please wear clean clothes to the hospital/surgery center.  Please read over the following fact sheets that you were given. Pain Booklet, Coughing and Deep Breathing and Surgical Site Infection Prevention

## 2017-11-15 ENCOUNTER — Encounter (HOSPITAL_COMMUNITY)
Admission: RE | Admit: 2017-11-15 | Discharge: 2017-11-15 | Disposition: A | Discharge status: Home / Self Care | Payer: Medicare Other | Source: Ambulatory Visit | Attending: Interventional Radiology | Admitting: Interventional Radiology

## 2017-11-15 ENCOUNTER — Other Ambulatory Visit: Payer: Self-pay

## 2017-11-15 ENCOUNTER — Encounter (HOSPITAL_COMMUNITY): Payer: Self-pay

## 2017-11-15 DIAGNOSIS — Z01812 Encounter for preprocedural laboratory examination: Secondary | ICD-10-CM | POA: Insufficient documentation

## 2017-11-15 DIAGNOSIS — I4891 Unspecified atrial fibrillation: Secondary | ICD-10-CM | POA: Diagnosis not present

## 2017-11-15 DIAGNOSIS — E119 Type 2 diabetes mellitus without complications: Secondary | ICD-10-CM | POA: Insufficient documentation

## 2017-11-15 LAB — BASIC METABOLIC PANEL
Anion gap: 12 (ref 5–15)
BUN: 11 mg/dL (ref 6–20)
CHLORIDE: 104 mmol/L (ref 101–111)
CO2: 24 mmol/L (ref 22–32)
CREATININE: 0.88 mg/dL (ref 0.61–1.24)
Calcium: 9.3 mg/dL (ref 8.9–10.3)
GFR calc Af Amer: >60 mL/min (ref >60)
GFR calc non Af Amer: >60 mL/min (ref >60)
Glucose, Bld: 108 mg/dL — ABNORMAL HIGH (ref 65–99)
POTASSIUM: 4.3 mmol/L (ref 3.5–5.1)
Sodium: 140 mmol/L (ref 135–145)

## 2017-11-15 LAB — GLUCOSE, CAPILLARY: GLUCOSE-CAPILLARY: 92 mg/dL (ref 65–99)

## 2017-11-15 LAB — CBC WITH DIFFERENTIAL/PLATELET
Basophils Absolute: 0 10*3/uL (ref 0.0–0.1)
Basophils Relative: 0 %
EOS ABS: 0.1 10*3/uL (ref 0.0–0.7)
EOS PCT: 2 %
HCT: 42.3 % (ref 39.0–52.0)
HEMOGLOBIN: 14 g/dL (ref 13.0–17.0)
LYMPHS ABS: 1.5 10*3/uL (ref 0.7–4.0)
LYMPHS PCT: 24 %
MCH: 29.9 pg (ref 26.0–34.0)
MCHC: 33.1 g/dL (ref 30.0–36.0)
MCV: 90.4 fL (ref 78.0–100.0)
Monocytes Absolute: 0.5 10*3/uL (ref 0.1–1.0)
Monocytes Relative: 8 %
NEUTROS PCT: 66 %
Neutro Abs: 4 10*3/uL (ref 1.7–7.7)
Platelets: 239 10*3/uL (ref 150–400)
RBC: 4.68 MIL/uL (ref 4.22–5.81)
RDW: 13.2 % (ref 11.5–15.5)
WBC: 6.1 10*3/uL (ref 4.0–10.5)

## 2017-11-15 LAB — PROTIME-INR
INR: 0.92
PROTHROMBIN TIME: 12.3 s (ref 11.4–15.2)

## 2017-11-15 LAB — APTT: aPTT: 24 seconds (ref 24–36)

## 2017-11-15 LAB — PLATELET INHIBITION P2Y12: PLATELET FUNCTION P2Y12: 71 [PRU] — AB (ref 194–418)

## 2017-11-15 NOTE — Progress Notes (Signed)
PCP is Dr. Jordan Hawks Bouska @ [facility]  LOVE 09/2017 Cardio is Dr. Tyler AasM Harwani, saw him Jan. 16th for stress test. (was having chest pains)  Conclusion by Dr. Sharyn LullHarwani its not cardiac in nature, but stress & anxiety. (have requested report)  Recent A1C was 5.2 10/2017 He doesn't check sugar at all, but it may run 108 - 135.

## 2017-11-16 ENCOUNTER — Encounter (HOSPITAL_COMMUNITY): Payer: Self-pay

## 2017-11-16 NOTE — Progress Notes (Signed)
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 Julieanne CottonSanjeev Deveshwar. He initially had IR procedure scheduled for 08/26/17, but p2y12 remained low at 5. His Plavix dose was decreased. 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 He has been transitioned from Eliquis to Plavix for these procedures.   History includes former smoker (quit '15), post-operative nausea, afib (s/p DCCV 12/18/16 and 01/12/17), hiatal hernia, GERD, DM2, HTN, HLD, CVA 06/2014, CKD, COPD, asthma, Bipolar disorder, depression, chronic back pain, moderate OSA (using CPAP). Has glasses and dentures. 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  - PCP is listed as Dr. Tracey Harriesavid Bouska [facility] Every[facility]). - Cardiologist is Dr. Rinaldo CloudMohan Harwani. Last office note 10/15/17. Patient reported new chest pain so stress test was ordered which was non-ischemic, but EF 46%. Patient reports chest pain thought to be related to GERD as it resolved with Nexium.  - Vascular surgeon is Dr. Waverly Ferrarihristopher Dickson. He was seen on 10/27/17 for BLE edema and varicose veins. Venous U/S was negative for DVT.  Compression stockings recommended.  Meds include albuterol, amiodarone, Eliquis (on hold while on Plavix/ASA), ASA (decreased from 325 mg to 81 mg 08/24/17), Lipitor, Nexium, Plavix 75 mg 1/2 tab every other day, Neurontin, metformin, Nitro, KCl, Anoro Ellipta.  BP 136/65   Ht 5\' 8"  (1.727 m)   Wt 249 lb 8 oz (113.2 kg)   BMI 37.94 kg/m   EKG2/4/19: Sinus bradycardia at 52 bpm.  Premature ventricular complexes or fusion  complexes.  Incomplete right bundle branch block.  Minimal voltage criteria for LVH, may be normal variant.   EKG 02/05/17 (Dr. Sharyn LullHarwani; scanned under Media tab, Correspondence, 05/10/17): SB at 53 bpm, LAD, incomplete right BBB, voltage criteria for LVH. Baseline wanderer in multiple leads, worse in I, II.  Nuclear stress test 10/27/17: IMPRESSION: 1. No reversible ischemia or infarction. 2. Normal left ventricular wall motion. 3. Left ventricular ejection fraction 46% 4. Non invasive risk stratification*: Moderate. The risk stratification is based on decreased ejection fraction and increased end systolic volume.  (For comparison, prior nuclear stress test on 04/03/16 showed EF 46% and on 02/03/10 EF 43%.)  TEE (done with DCCV) 12/18/16:Study Conclusions - Left ventricle: Systolic function was normal. Wall motion was normal; there were no regional wall motion abnormalities. - Mitral valve: There was mild regurgitation directed centrally. - Left atrium: No evidence of thrombus in the atrial cavity or appendage. - Right atrium: No evidence of thrombus in the atrial cavity or appendage. - Atrial septum: There was a patent foramen ovale by sonicated saline injection and pressure on abdomen. IMPRESSION: 1. Normal LV systolic function. 2. PFO by saline injection. 3. No clot in LA or appendage RECOMMENDATIONS: Patient underwent 120 J biphasic shock x one with successful cardioversion. IV amiodarone resumed.  Echo (TTE) 12/18/16: Study Conclusions - Left ventricle: The cavity size was normal. Systolic function was   normal. The estimated ejection fraction was in the range of 50%   to 55%. Wall motion was normal; there were no regional wall   motion abnormalities. - Left atrium: The atrium was mildly dilated.  BLE venous Duplex 10/27/17: Impression: No  evidence of deep or superficial thrombus noted in the vessels listed.  CTA head/neck 04/08/17: IMPRESSION: 1. The right carotid  bifurcation stent is patent. 2. Slight decreased size of the cavernous internal carotid artery on the left may reflect some flow limitation on the right. 3. 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 4. Left M1 segment and MCA bifurcation stent and aneurysm coils without evidence for residual or recurrent aneurysm or distal occlusion. 5. Stable mm right carotid bifurcation aneurysm. 6. 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  CXR 01/12/17: IMPRESSION: No active cardiopulmonary disease.  Preoperative labsnoted.CBC WNL. Cr 0.88. Glucose 108. PT/PTT WNL. P2y12 71.   Patient with recent non-ischemic stress test. EF is similar to prior stress test results. EF by echo ~ 50-55% last year. Patient denied on-going chest symptoms. He has not had any known recurrent afib. [id number] results are now in the 50-150 range. Defer any additional lab orders to IR. If no acute changes then I anticipate that he can proceed as planned from an anesthesia standpoint. Discussed with anesthesiologist Dr. Achille Rich.  Velna Ochs [facility] Short Stay Center/Anesthesiology Phone [phone] 11/16/2017 5:50 PM

## 2017-11-19 ENCOUNTER — Other Ambulatory Visit: Payer: Self-pay | Admitting: General Surgery

## 2017-11-21 NOTE — Anesthesia Preprocedure Evaluation (Addendum)
Anesthesia Evaluation  Patient identified by MRN, date of birth, ID band Patient awake    Reviewed: Allergy & Precautions, H&P , NPO status , Patient's Chart, lab work & pertinent test results  History of Anesthesia Complications (+) PONV  Airway Mallampati: III  TM Distance: >3 FB Neck ROM: Full    Dental no notable dental hx. (+) Edentulous Upper, Edentulous Lower, Dental Advisory Given   Pulmonary asthma , COPD, former smoker,    Pulmonary exam normal breath sounds clear to auscultation       Cardiovascular Exercise Tolerance: Good hypertension, + CAD and + Peripheral Vascular Disease  + dysrhythmias Atrial Fibrillation  Rhythm:Regular Rate:Normal     Neuro/Psych  Headaches, Depression Bipolar Disorder TIACVA    GI/Hepatic Neg liver ROS, GERD  ,  Endo/Other  diabetes, Type 2, Oral Hypoglycemic Agents  Renal/GU Renal InsufficiencyRenal diseasenegative Renal ROS  negative genitourinary   Musculoskeletal  (+) Arthritis , Osteoarthritis,    Abdominal   Peds  Hematology negative hematology ROS (+)   Anesthesia Other Findings   Reproductive/Obstetrics negative OB ROS                            Anesthesia Physical Anesthesia Plan  ASA: III  Anesthesia Plan: General   Post-op Pain Management:    Induction: Intravenous  PONV Risk Score and Plan: 3 and Ondansetron, Dexamethasone and Midazolam  Airway Management Planned: Oral ETT  Additional Equipment: Arterial line  Intra-op Plan:   Post-operative Plan: Extubation in OR  Informed Consent: I have reviewed the patients History and Physical, chart, labs and discussed the procedure including the risks, benefits and alternatives for the proposed anesthesia with the patient or authorized representative who has indicated his/her understanding and acceptance.   Dental advisory given  Plan Discussed with: CRNA  Anesthesia Plan  Comments:         Anesthesia Quick Evaluation

## 2017-11-22 ENCOUNTER — Ambulatory Visit (HOSPITAL_COMMUNITY): Payer: Medicare Other | Admitting: Emergency Medicine

## 2017-11-22 ENCOUNTER — Encounter (HOSPITAL_COMMUNITY): Payer: Self-pay | Admitting: Radiology

## 2017-11-22 ENCOUNTER — Other Ambulatory Visit: Payer: Self-pay

## 2017-11-22 ENCOUNTER — Ambulatory Visit (HOSPITAL_COMMUNITY): Payer: Medicare Other | Admitting: Anesthesiology

## 2017-11-22 ENCOUNTER — Ambulatory Visit (HOSPITAL_COMMUNITY)
Admission: RE | Admit: 2017-11-22 | Discharge: 2017-11-22 | Disposition: A | Discharge status: Home / Self Care | Payer: Medicare Other | Source: Ambulatory Visit | Attending: Interventional Radiology | Admitting: Interventional Radiology

## 2017-11-22 ENCOUNTER — Encounter (HOSPITAL_COMMUNITY)
Admission: RE | Disposition: A | Discharge status: Home / Self Care | Payer: Self-pay | Source: Ambulatory Visit | Attending: Interventional Radiology

## 2017-11-22 DIAGNOSIS — Z8249 Family history of ischemic heart disease and other diseases of the circulatory system: Secondary | ICD-10-CM | POA: Diagnosis not present

## 2017-11-22 DIAGNOSIS — Z823 Family history of stroke: Secondary | ICD-10-CM | POA: Insufficient documentation

## 2017-11-22 DIAGNOSIS — J449 Chronic obstructive pulmonary disease, unspecified: Secondary | ICD-10-CM | POA: Insufficient documentation

## 2017-11-22 DIAGNOSIS — I129 Hypertensive chronic kidney disease with stage 1 through stage 4 chronic kidney disease, or unspecified chronic kidney disease: Secondary | ICD-10-CM | POA: Insufficient documentation

## 2017-11-22 DIAGNOSIS — E1136 Type 2 diabetes mellitus with diabetic cataract: Secondary | ICD-10-CM | POA: Insufficient documentation

## 2017-11-22 DIAGNOSIS — Z7984 Long term (current) use of oral hypoglycemic drugs: Secondary | ICD-10-CM | POA: Insufficient documentation

## 2017-11-22 DIAGNOSIS — Z7902 Long term (current) use of antithrombotics/antiplatelets: Secondary | ICD-10-CM | POA: Diagnosis not present

## 2017-11-22 DIAGNOSIS — M545 Low back pain: Secondary | ICD-10-CM | POA: Insufficient documentation

## 2017-11-22 DIAGNOSIS — E1122 Type 2 diabetes mellitus with diabetic chronic kidney disease: Secondary | ICD-10-CM | POA: Insufficient documentation

## 2017-11-22 DIAGNOSIS — F319 Bipolar disorder, unspecified: Secondary | ICD-10-CM | POA: Insufficient documentation

## 2017-11-22 DIAGNOSIS — Z7982 Long term (current) use of aspirin: Secondary | ICD-10-CM | POA: Diagnosis not present

## 2017-11-22 DIAGNOSIS — I671 Cerebral aneurysm, nonruptured: Secondary | ICD-10-CM | POA: Insufficient documentation

## 2017-11-22 DIAGNOSIS — N189 Chronic kidney disease, unspecified: Secondary | ICD-10-CM | POA: Diagnosis not present

## 2017-11-22 DIAGNOSIS — G4730 Sleep apnea, unspecified: Secondary | ICD-10-CM | POA: Diagnosis not present

## 2017-11-22 DIAGNOSIS — I4891 Unspecified atrial fibrillation: Secondary | ICD-10-CM | POA: Diagnosis not present

## 2017-11-22 DIAGNOSIS — I2510 Atherosclerotic heart disease of native coronary artery without angina pectoris: Secondary | ICD-10-CM | POA: Insufficient documentation

## 2017-11-22 DIAGNOSIS — E785 Hyperlipidemia, unspecified: Secondary | ICD-10-CM | POA: Insufficient documentation

## 2017-11-22 DIAGNOSIS — K219 Gastro-esophageal reflux disease without esophagitis: Secondary | ICD-10-CM | POA: Diagnosis not present

## 2017-11-22 DIAGNOSIS — M1990 Unspecified osteoarthritis, unspecified site: Secondary | ICD-10-CM | POA: Diagnosis not present

## 2017-11-22 DIAGNOSIS — R51 Headache: Secondary | ICD-10-CM | POA: Diagnosis present

## 2017-11-22 DIAGNOSIS — Z87891 Personal history of nicotine dependence: Secondary | ICD-10-CM | POA: Diagnosis not present

## 2017-11-22 DIAGNOSIS — G8929 Other chronic pain: Secondary | ICD-10-CM | POA: Diagnosis not present

## 2017-11-22 HISTORY — PX: IR ANGIO VERTEBRAL SEL SUBCLAVIAN INNOMINATE UNI R MOD SED: IMG5365

## 2017-11-22 HISTORY — PX: IR ANGIO INTRA EXTRACRAN SEL COM CAROTID INNOMINATE BILAT MOD SED: IMG5360

## 2017-11-22 HISTORY — PX: RADIOLOGY WITH ANESTHESIA: SHX6223

## 2017-11-22 LAB — GLUCOSE, CAPILLARY
Glucose-Capillary: 90 mg/dL (ref 65–99)
Glucose-Capillary: 93 mg/dL (ref 65–99)

## 2017-11-22 LAB — PLATELET INHIBITION P2Y12: Platelet Function  P2Y12: 131 [PRU] — ABNORMAL LOW (ref 194–418)

## 2017-11-22 SURGERY — IR WITH ANESTHESIA
Anesthesia: Monitor Anesthesia Care

## 2017-11-22 MED ORDER — MIDAZOLAM HCL 5 MG/5ML IJ SOLN
INTRAMUSCULAR | Status: DC | PRN
  Administered 2017-11-22 (×2): 1 mg via INTRAVENOUS
  Administered 2017-11-22: 2 mg via INTRAVENOUS
  Administered 2017-11-22: 1 mg via INTRAVENOUS

## 2017-11-22 MED ORDER — CLOPIDOGREL BISULFATE 75 MG PO TABS
ORAL_TABLET | ORAL | Status: AC
  Administered 2017-11-22: 75 mg via ORAL
  Filled 2017-11-22: qty 1

## 2017-11-22 MED ORDER — LIDOCAINE HCL 1 % IJ SOLN
INTRAMUSCULAR | Status: AC
  Filled 2017-11-22: qty 20

## 2017-11-22 MED ORDER — CLOPIDOGREL BISULFATE 75 MG PO TABS
75.0000 mg | ORAL_TABLET | Freq: Every day | ORAL | Status: DC
  Administered 2017-11-22: 75 mg via ORAL

## 2017-11-22 MED ORDER — CEFAZOLIN SODIUM-DEXTROSE 2-4 GM/100ML-% IV SOLN
INTRAVENOUS | Status: AC
  Filled 2017-11-22: qty 100

## 2017-11-22 MED ORDER — NIMODIPINE 30 MG PO CAPS
0.0000 mg | ORAL_CAPSULE | ORAL | Status: DC
  Filled 2017-11-22: qty 2

## 2017-11-22 MED ORDER — ASPIRIN 81 MG PO CHEW
CHEWABLE_TABLET | ORAL | Status: AC
  Administered 2017-11-22: 243 mg
  Filled 2017-11-22: qty 3

## 2017-11-22 MED ORDER — SODIUM CHLORIDE 0.9 % IV SOLN: INTRAVENOUS | Status: DC

## 2017-11-22 MED ORDER — NIMODIPINE 30 MG PO CAPS
ORAL_CAPSULE | ORAL | Status: AC
  Filled 2017-11-22: qty 1

## 2017-11-22 MED ORDER — FENTANYL CITRATE (PF) 100 MCG/2ML IJ SOLN: 25.0000 ug | INTRAMUSCULAR | Status: DC | PRN

## 2017-11-22 MED ORDER — ASPIRIN 81 MG PO CHEW
CHEWABLE_TABLET | ORAL | Status: AC
  Filled 2017-11-22: qty 1

## 2017-11-22 MED ORDER — FUROSEMIDE 40 MG PO TABS
40.0000 mg | ORAL_TABLET | Freq: Once | ORAL | Status: AC
  Administered 2017-11-22: 40 mg via ORAL
  Filled 2017-11-22: qty 1

## 2017-11-22 MED ORDER — ASPIRIN EC 325 MG PO TBEC
325.0000 mg | DELAYED_RELEASE_TABLET | ORAL | Status: DC
  Filled 2017-11-22: qty 1

## 2017-11-22 MED ORDER — LIDOCAINE HCL (PF) 1 % IJ SOLN
INTRAMUSCULAR | Status: AC | PRN
  Administered 2017-11-22: 10 mL

## 2017-11-22 MED ORDER — LACTATED RINGERS IV SOLN
INTRAVENOUS | Status: DC
  Administered 2017-11-22 (×2): via INTRAVENOUS

## 2017-11-22 MED ORDER — FENTANYL CITRATE (PF) 100 MCG/2ML IJ SOLN
INTRAMUSCULAR | Status: DC | PRN
  Administered 2017-11-22 (×2): 50 ug via INTRAVENOUS

## 2017-11-22 MED ORDER — IOPAMIDOL (ISOVUE-300) INJECTION 61%
INTRAVENOUS | Status: AC
  Administered 2017-11-22: 45 mL
  Filled 2017-11-22: qty 300

## 2017-11-22 MED ORDER — CLOPIDOGREL BISULFATE 75 MG PO TABS
75.0000 mg | ORAL_TABLET | ORAL | Status: AC
  Administered 2017-11-22: 75 mg via ORAL
  Filled 2017-11-22: qty 1

## 2017-11-22 MED ORDER — CEFAZOLIN SODIUM-DEXTROSE 2-4 GM/100ML-% IV SOLN
2.0000 g | INTRAVENOUS | Status: DC
  Filled 2017-11-22: qty 100

## 2017-11-22 NOTE — Procedures (Signed)
S/P bilateral common carotid arteriogram RT CFA approach. Findings 1.Lt ICA ACA 3mm x 2.2 mm aneurysm  2.Approcx 2.693mm x 2mm  Rt MCA aneurysm

## 2017-11-22 NOTE — Transfer of Care (Signed)
Immediate Anesthesia Transfer of Care Note  Patient: [name]  Procedure(s) Performed: EMBOLIZATION (N/A )  Patient Location: PACU  Anesthesia Type:MAC  Level of Consciousness: awake, alert  and oriented  Airway & Oxygen Therapy: Patient Spontanous Breathing  Post-op Assessment: Report given to RN and Post -op Vital signs reviewed and stable  Post vital signs: Reviewed and stable  Last Vitals:  Vitals:   11/22/17 0649  BP: (!) 124/55  Pulse: (!) 56  Temp: 36.5 C  SpO2: 96%    Last Pain: There were no vitals filed for this visit.       Complications: No apparent anesthesia complications

## 2017-11-22 NOTE — Discharge Instructions (Signed)
Cerebral Angiogram, Care After °Refer to this sheet in the next few weeks. These instructions provide you with information on caring for yourself after your procedure. Your health care provider may also give you more specific instructions. Your treatment has been planned according to current medical practices, but problems sometimes occur. Call your health care provider if you have any problems or questions after your procedure. °What can I expect after the procedure? °After your procedure, it is typical to have the following: °· Bruising at the catheter insertion site that usually fades within 1-2 weeks. °· Blood collecting in the tissue (hematoma) that may be painful to the touch. It should usually decrease in size and tenderness within 1-2 weeks. °· A mild headache. ° °Follow these instructions at home: °· Take medicines only as directed by your health care provider. °· You may shower 24-48 hours after the procedure or as directed by your health care provider. Remove the bandage (dressing) and gently wash the site with plain soap and water. Pat the area dry with a clean towel. Do not rub the site, because this may cause bleeding. °· Do not take baths, swim, or use a hot tub until your health care provider approves. °· Check your insertion site every day for redness, swelling, or drainage. °· Do not apply powder or lotion to the site. °· Do not lift over 10 lb (4.5 kg) for 5 days after your procedure or as directed by your health care provider. °· Ask your health care provider when it is okay to: °? Return to work or school. °? Resume usual physical activities or sports. °? Resume sexual activity. °· Do not drive home if you are discharged the same day as the procedure. Have someone else drive you. °· You may drive 24 hours after the procedure unless otherwise instructed by your health care provider. °· Do not operate machinery or power tools for 24 hours after the procedure or as directed by your health care  provider. °· If your procedure was done as an outpatient procedure, which means that you went home the same day as your procedure, a responsible adult should be with you for the first 24 hours after you arrive home. °· Keep all follow-up visits as directed by your health care provider. This is important. °Contact a health care provider if: °· You have a fever. °· You have chills. °· You have increased bleeding from the catheter insertion site. Hold pressure on the site. °Get help right away if: °· You have vision changes or loss of vision. °· You have numbness or weakness on one side of your body. °· You have difficulty talking, or you have slurred speech or cannot speak (aphasia). °· You feel confused or have difficulty remembering. °· You have unusual pain at the catheter insertion site. °· You have redness, warmth, or swelling at the catheter insertion site. °· You have drainage (other than a small amount of blood on the dressing) from the catheter insertion site. °· The catheter insertion site is bleeding, and the bleeding does not stop after 30 minutes of holding steady pressure on the site. °These symptoms may represent a serious problem that is an emergency. Do not wait to see if the symptoms will go away. Get medical help right away. Call your local emergency services (911 in [place] Do not drive yourself to the hospital. °This information is not intended to replace advice given to you by your health care provider. Make sure you discuss any questions   you have with your health care provider. °Document Released: 02/12/2014 Document Revised: 03/05/2016 Document Reviewed: 10/11/2013 °[employer] Interactive Patient Education © 2017 [employer] ° °

## 2017-11-22 NOTE — H&P (Signed)
Chief Complaint: Patient was seen in consultation today for cerebral arteriogram with possible R MCA aneurysm embolization at the request of Dr Lina Sayre  Supervising Physician: [name]  Patient Status: [facility] - Out-pt  History of Present Illness: [name] is a 57 y.o. 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 A-fib (HCC)   . Aneurysm (HCC)    brain  . Arthritis   . Atrial fibrillation (HCC)   . Bipolar disorder (HCC)    was on meds but was taken off 2 yrs ago and none since  . Burning pain    in both legs-seeing Dr.Sethi for this  . CAD (coronary artery disease)   . Cataracts, bilateral   . Childhood asthma    Last asthma attack at age 51; History of trach at 16 months (12/17/2016)  . Chronic kidney disease 2009  . Chronic lower back pain   . COPD (chronic obstructive pulmonary disease) (HCC) 06/2012   uses Spiriva and Albuterol daily as needed  . Depression   . Family history of adverse reaction to anesthesia    "Mom and sister have PONV"  . GERD (gastroesophageal reflux disease)   . H/O hiatal hernia   . Headache(784.0)    "q 2-3 weeks; daily last 2 wks" (12/17/2016)  . Hyperlipidemia    takes Ramipril daily  . Hypertension    takes Ramipril daily  . Joint pain   . Joint swelling   . Middle cerebral artery aneurysm    right  . Nocturia   . Numbness    both arms   . Pneumonia    hx of-2014  . PONV (postoperative nausea and vomiting)    Pt reports nausea only.  . Sleep apnea    dx just this past week   08/21/2017  . Stroke [facility]) 2015   "drag left foot more since; have to wear glasses now" (12/17/2016)  . Type II diabetes mellitus (HCC)    takes Metformin daily   dx 2015  . Umbilical hernia   . Urinary frequency   . Wears dentures   . Wears glasses    and contact lenses    Past Surgical History:  Procedure Laterality Date  . ANEURYSM COILING  2015  . CARDIOVERSION N/A 12/18/2016   Procedure: CARDIOVERSION;  Surgeon: Orpah Cobb, MD;  Location: [facility] ENDOSCOPY;  Service: Cardiovascular;  Laterality: N/A;  . COLONOSCOPY    . EXCISIONAL HEMORRHOIDECTOMY  1980s   "soon after rectal OR"  . GANGLION CYST EXCISION Right   . IR 3D INDEPENDENT WKST  05/10/2017  . IR 3D INDEPENDENT WKST  06/28/2017  . IR ANGIO INTRA EXTRACRAN SEL COM CAROTID INNOMINATE BILAT MOD SED  04/22/2017  . IR ANGIO INTRA EXTRACRAN SEL INTERNAL CAROTID UNI R MOD SED  05/10/2017  . IR ANGIO INTRA EXTRACRAN SEL INTERNAL CAROTID UNI R MOD SED  06/28/2017  .  IR ANGIO VERTEBRAL SEL SUBCLAVIAN INNOMINATE UNI L MOD SED  04/22/2017  . IR ANGIO VERTEBRAL SEL SUBCLAVIAN INNOMINATE UNI R MOD SED  06/28/2017  . IR ANGIO VERTEBRAL SEL VERTEBRAL UNI R MOD SED  04/22/2017  . IR ANGIOGRAM FOLLOW UP STUDY  05/10/2017  . IR ANGIOGRAM FOLLOW UP STUDY  06/28/2017  . IR NEURO EACH ADD'L AFTER BASIC UNI RIGHT (MS)  05/10/2017  . IR NEURO EACH ADD'L AFTER BASIC UNI RIGHT (MS)  06/28/2017  . IR RADIOLOGIST EVAL & MGMT  04/30/2017  . IR RADIOLOGIST EVAL & MGMT  06/15/2017  . IR RADIOLOGIST EVAL & MGMT  07/13/2017  . IR TRANSCATH/EMBOLIZ  05/10/2017  . IR TRANSCATH/EMBOLIZ  06/28/2017  . MULTIPLE TOOTH EXTRACTIONS    . RADIOLOGY WITH ANESTHESIA N/A 08/08/2014   Procedure: RADIOLOGY WITH ANESTHESIA EMBOLIZATION;  Surgeon: Medication Radiologist, MD;  Location: [facility] OR;  Service: Radiology;  Laterality: N/A;    . RADIOLOGY WITH ANESTHESIA N/A 11/21/2014   Procedure: RADIOLOGY WITH ANESTHESIA;  Surgeon: Oneal GroutSanjeev K Deveshwar, MD;  Location: [facility] OR;  Service: Radiology;  Laterality: N/A;  . RADIOLOGY WITH ANESTHESIA N/A 02/27/2015   Procedure: RADIOLOGY WITH ANESTHESIA;  Surgeon: Julieanne CottonSanjeev Deveshwar, MD;  Location: [facility] OR;  Service: Radiology;  Laterality: N/A;  . RADIOLOGY WITH ANESTHESIA N/A 09/25/2015   Procedure: RADIOLOGY WITH ANESTHESIA;  Surgeon: Julieanne CottonSanjeev Deveshwar, MD;  Location: [facility] OR;  Service: Radiology;  Laterality: N/A;  . RADIOLOGY WITH ANESTHESIA N/A 05/10/2017   Procedure: EMBOLIZATION;  Surgeon: Julieanne Cottoneveshwar, Sanjeev, MD;  Location: [facility] OR;  Service: Radiology;  Laterality: N/A;  . RADIOLOGY WITH ANESTHESIA N/A 06/28/2017   Procedure: RADIOLOGY WITH ANESTHESIA-EMBOLIZATION;  Surgeon: Julieanne Cottoneveshwar, Sanjeev, MD;  Location: [facility] OR;  Service: Radiology;  Laterality: N/A;  . RADIOLOGY WITH ANESTHESIA N/A 08/26/2017   Procedure: RADIOLOGY WITH ANESTHESIA EMBOLIZATION;  Surgeon: Julieanne Cottoneveshwar, Sanjeev, MD;  Location: [facility] OR;  Service: Radiology;  Laterality: N/A;  . RECTAL SURGERY  1980s   "tore it lifting heavy furniture; sewed it back together"  . SHOULDER ARTHROSCOPY WITH OPEN ROTATOR CUFF REPAIR Right 2016  . SHOULDER ARTHROSCOPY WITH ROTATOR CUFF REPAIR Left 1996  . SHOULDER OPEN ROTATOR CUFF REPAIR Left 1997   "took out 8inches of my collarbone"  . TEE WITHOUT CARDIOVERSION N/A 12/18/2016   Procedure: TRANSESOPHAGEAL ECHOCARDIOGRAM (TEE);  Surgeon: Orpah CobbAjay Kadakia, MD;  [facility];  Service: Cardiovascular;  Laterality: N/A;  . TRACHEOSTOMY  1964   at age 56 months old; for asthma  . TRACHEOSTOMY CLOSURE      Allergies: Tylox [oxycodone-acetaminophen] and Adhesive [tape]  Medications: Prior to Admission medications   Medication Sig Start Date End Date Taking? Authorizing Provider  albuterol (PROVENTIL HFA;VENTOLIN HFA) 108 (90 BASE) MCG/ACT inhaler Inhale 2 puffs into the lungs every 6 (six)  hours as needed for wheezing or shortness of breath.    Yes [provider]  amiodarone (PACERONE) 200 MG tablet Take 1 tablet (200 mg total) by mouth daily. 12/20/16  Yes Orpah CobbKadakia, Ajay, MD  aspirin EC 81 MG tablet Take 1 tablet (81 mg total) daily by mouth. 08/26/17  Yes Barnetta Chapelsborne, Kelly, PA-C  atorvastatin (LIPITOR) 20 MG tablet Take 1 tablet (20 mg total) by mouth daily at 6 PM. Patient taking differently: Take 20 mg by mouth every evening.  07/06/14  Yes Calvert Cantorizwan, Saima, MD  clopidogrel (PLAVIX) 75 MG tablet Take 0.5 tablets (37.5 mg total) daily by mouth. Patient taking differently: Take 37.5 mg by mouth every other day.  08/26/17  Yes  Barnetta Chapel, PA-C  furosemide (LASIX) 40 MG tablet Take 40 mg by mouth daily.   Yes [provider]  gabapentin (NEURONTIN) 100 MG capsule TAKE 1 CAPSULE BY MOUTH THREE TIMES DAILY 08/25/15  Yes Marvel Plan, MD  metFORMIN (GLUCOPHAGE) 500 MG tablet Take 1 tablet (500 mg total) by mouth 2 (two) times daily with a meal. 07/06/14  Yes Rizwan, Ladell Heads, MD  potassium chloride (K-DUR) 10 MEQ tablet Take 10 mEq by mouth daily.   Yes [provider]  umeclidinium-vilanterol (ANORO ELLIPTA) 62.5-25 MCG/INH AEPB Inhale 1 puff into the lungs daily.    Yes [provider]  apixaban (ELIQUIS) 5 MG TABS tablet Take 5 mg by mouth 2 (two) times daily.     [provider]  esomeprazole (NEXIUM) 40 MG capsule Take 40 mg by mouth at bedtime as needed (for acid reflux).     [provider]  hydrochlorothiazide (HYDRODIURIL) 25 MG tablet Take 0.5 tablets (12.5 mg total) by mouth daily. 12/19/16   Orpah Cobb, MD  nitroGLYCERIN (NITROSTAT) 0.4 MG SL tablet Place 1 tablet (0.4 mg total) under the tongue every 5 (five) minutes x 3 doses as needed for chest pain. 07/08/14   Orpah Cobb, MD     Family History  Problem Relation Age of Onset  . Heart disease Mother        s/p 3V CABG  . Cancer Father 13       lung cancer; +tobacco  .  Stroke Father   . Cancer Maternal Grandmother   . Heart disease Maternal Grandmother   . Cancer Paternal Grandmother   . Lupus Sister   . Multiple sclerosis Sister   . Anemia Daughter     Social History   Socioeconomic History  . Marital status: Divorced    Spouse name: not together since 2007  . Number of children: 3  . Years of education: 10  . Highest education level: None  Social Needs  . Financial resource strain: None  . Food insecurity - worry: None  . Food insecurity - inability: None  . Transportation needs - medical: None  . Transportation needs - non-medical: None  Occupational History  . 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 Smoking status: Former Smoker    Packs/day: 1.50    Years: 25.00    Pack years: 37.50    Types: Cigarettes    Start date: 01/15/1975    Last attempt to quit: 06/05/2014    Years since quitting: 3.4  . Smokeless tobacco: Never Used  Substance and Sexual Activity  . Alcohol use: Yes    Alcohol/week: 1.8 oz    Types: 3 Cans of beer per week    Comment: drinks 3 - 12oz bottles every monday, during shooting pool  . Drug use: No  . Sexual activity: No    Partners: Female    Comment: partner has had surgery to prevent pregnancy  Other Topics Concern  . None  Social History Narrative   Lives with his son and his mother.  His son has no contact with the son's mother, though the patient is not legally separated from her.  She has a history of drug use and has been in prison several times.    Review of Systems: A 12 point ROS discussed and pertinent positives are indicated in the HPI above.  All other systems are negative.  Review of Systems  Constitutional:  Negative for activity change, fatigue and fever.  HENT: Negative for hearing loss, tinnitus and trouble swallowing.   Eyes: Negative for visual disturbance.  Respiratory: Negative for cough and shortness of breath.   Cardiovascular:  Negative for chest pain.  Gastrointestinal: Negative for abdominal pain.  Musculoskeletal: Negative for back pain and gait problem.  Neurological: Negative for dizziness, tremors, seizures, syncope, facial asymmetry, speech difficulty, weakness, light-headedness, numbness and headaches.  Psychiatric/Behavioral: Negative for behavioral problems and confusion.    Vital Signs: BP (!) 124/55   Pulse (!) 56   Temp 97.7 F (36.5 C)   Ht 5\' 8"  (1.727 m)   Wt 249 lb 8 oz (113.2 kg)   SpO2 96%   BMI 37.94 kg/m   Physical Exam  Constitutional: He is oriented to person, place, and time. He appears well-nourished.  HENT:  Head: Atraumatic.  Eyes: EOM are normal.  Neck: Neck supple.  Cardiovascular: Normal rate, regular rhythm and normal heart sounds.  Pulmonary/Chest: Effort normal and breath sounds normal. He has no wheezes.  Abdominal: Soft. Bowel sounds are normal. There is no tenderness.  Musculoskeletal: Normal range of motion. He exhibits no edema.  Neurological: He is alert and oriented to person, place, and time.  Skin: Skin is warm and dry.  Psychiatric: He has a normal mood and affect. His behavior is normal. Judgment and thought content normal.  Nursing note and vitals reviewed.   Imaging: Nm Myocar Multi W/spect W/wall Motion / Ef  Result Date: 10/27/2017 CLINICAL DATA:  Chest pain.  Diabetes.  Hypertension. EXAM: MYOCARDIAL IMAGING WITH SPECT (REST AND PHARMACOLOGIC-STRESS) GATED LEFT VENTRICULAR WALL MOTION STUDY LEFT VENTRICULAR EJECTION FRACTION TECHNIQUE: Standard myocardial SPECT imaging was performed after resting intravenous injection of 10 mCi Tc-42m tetrofosmin. Subsequently, intravenous infusion of Lexiscan was performed under the supervision of the Cardiology staff. At peak effect of the drug, 30 mCi Tc-19m tetrofosmin was injected intravenously and standard myocardial SPECT imaging was performed. Quantitative gated imaging was also performed to evaluate left  ventricular wall motion, and estimate left ventricular ejection fraction. COMPARISON:  Nuclear medicine myocardial exam 04/03/2016. FINDINGS: Perfusion: No decreased activity in the left ventricle on stress imaging to suggest reversible ischemia or infarction. Wall Motion: Normal left ventricular wall motion. No left ventricular dilation. Left Ventricular Ejection Fraction: 46 % End diastolic volume 145 ml End systolic volume 79 ml IMPRESSION: 1. No reversible ischemia or infarction. 2. Normal left ventricular wall motion. 3. Left ventricular ejection fraction 46% 4. Non invasive risk stratification*: Moderate. The risk stratification is based on decreased ejection fraction and increased end systolic volume. *2012 Appropriate Use Criteria for Coronary Revascularization Focused Update: J Am Coll Cardiol. 2012;59(9):857-881. http://content.dementiazones.com.aspx?articleid=[email or web address] Electronically Signed   By: Marin Roberts M.D.   On: 10/27/2017 14:59    Labs:  CBC: Recent Labs    06/28/17 0646 06/29/17 0444 08/23/17 0942 11/15/17 0820  WBC 5.8 6.1 6.6 6.1  HGB 12.5* 11.7* 13.6 14.0  HCT 37.5* 35.5* 40.8 42.3  PLT 215 183 224 239    COAGS: Recent Labs    05/05/17 1342 06/28/17 0646 06/28/17 0741 08/23/17 0942 11/15/17 0820  INR 0.93  --  0.96 0.96 0.92  APTT 28 28  --  28 24    BMP: Recent Labs    07/13/17 1315 08/23/17 0942 10/27/17 0825 11/15/17 0820  NA 140 137 139 140  K 3.7 4.0 4.7 4.3  CL 104 104 103 104  CO2 28 24 27 24   GLUCOSE 132* 102* 114*  108*  BUN 11 12 13 11   CALCIUM 9.2 9.0 9.2 9.3  CREATININE 1.06 0.92 0.95 0.88  GFRNONAA >60 >60 >60 >60  GFRAA >60 >60 >60 >60    LIVER FUNCTION TESTS: Recent Labs    06/28/17 0646 07/13/17 1315 08/23/17 0942 10/27/17 0825  BILITOT 0.5 0.4 0.8 0.8  AST 18 19 19 20   ALT 21 20 22 24   ALKPHOS 51 66 61 62  PROT 6.2* 6.5 6.9 7.0  ALBUMIN 3.7 3.8 4.0 4.0    TUMOR MARKERS: No results for input(s): AFPTM,  CEA, CA199, CHROMGRNA in the last 8760 hours.  Assessment and Plan:  Multiple Intracranial aneurysms Scheduled today for additional treatment  (staged) of Right middle cerebral artery aneurysm Risks and benefits of cerebral arteriogram were discussed with the patient including, but not limited to bleeding, infection, vascular injury, contrast induced renal failure, stroke or even death. This interventional procedure involves the use of X-rays and because of the nature of the planned procedure, it is possible that we will have prolonged use of X-ray fluoroscopy. Potential radiation risks to you include (but are not limited to) the following: - A slightly elevated risk for cancer  several years later in life. This risk is typically less than 0.5% percent. This risk is low in comparison to the normal incidence of human cancer, which is 33% for women and 50% for men according to the American Cancer Society. - Radiation induced injury can include skin redness, resembling a rash, tissue breakdown / ulcers and hair loss (which can be temporary or permanent).  The likelihood of either of these occurring depends on the difficulty of the procedure and whether you are sensitive to radiation due to previous procedures, disease, or genetic conditions.  IF your procedure requires a prolonged use of radiation, you will be notified and given written instructions for further action.  It is your responsibility to monitor the irradiated area for the 2 weeks following the procedure and to notify your physician if you are concerned that you have suffered a radiation induced injury.    All of the patient's questions were answered, patient is agreeable to proceed. Consent signed and in chart.  Pt is aware if intervention is performed, he will be admitted overnight into Neuro ICU. Plan for discharge following day. He is agreeable  Thank you for this interesting consult.  I greatly enjoyed meeting [name] and  look forward to participating in their care.  A copy of this report was sent to the requesting provider on this date.  Electronically Signed: Robet Leu, PA-C 11/22/2017, 7:28 AM   I spent a total of    25 Minutes in face to face in clinical consultation, greater than 50% of which was counseling/coordinating care for R MCA staged aneurysm embolization

## 2017-11-22 NOTE — Sedation Documentation (Signed)
5 Fr. Exoseal to right groin 

## 2017-11-22 NOTE — Anesthesia Postprocedure Evaluation (Signed)
Anesthesia Post Note  Patient: [name]  Procedure(s) Performed: EMBOLIZATION (N/A )     Patient location during evaluation: PACU Anesthesia Type: MAC Level of consciousness: awake and alert Pain management: pain level controlled Vital Signs Assessment: post-procedure vital signs reviewed and stable Respiratory status: spontaneous breathing, nonlabored ventilation and respiratory function stable Cardiovascular status: stable and blood pressure returned to baseline Postop Assessment: no apparent nausea or vomiting Anesthetic complications: no    Last Vitals:  Vitals:   11/22/17 0649 11/22/17 1020  BP: (!) 124/55 (!) 117/59  Pulse: (!) 56 (!) 48  Resp:  12  Temp: 36.5 C 36.4 C  SpO2: 96% 96%    Last Pain: There were no vitals filed for this visit.               Keysi Oelkers,W. EDMOND

## 2017-11-22 NOTE — Addendum Note (Signed)
Addendum  created 11/22/17 1105 by Gaynelle AduFitzgerald, Thien Berka, MD   Intraprocedure Event edited, Intraprocedure Staff edited

## 2017-11-22 NOTE — Anesthesia Procedure Notes (Signed)
Arterial Line Insertion Start/End2/08/2018 7:20 AM, 11/22/2017 7:25 AM Performed by: Epifanio LeschesMercer, Caterina Racine L, CRNA, CRNA  Patient location: Pre-op. Preanesthetic checklist: patient identified, IV checked, site marked, risks and benefits discussed, surgical consent, monitors and equipment checked, pre-op evaluation, timeout performed and anesthesia consent Lidocaine 1% used for infiltration Left, radial was placed Catheter size: 20 G Hand hygiene performed  and maximum sterile barriers used  Allen's test indicative of satisfactory collateral circulation Attempts: 1 Procedure performed without using ultrasound guided technique. Ultrasound Notes:anatomy identified Following insertion, dressing applied and Biopatch. Post procedure assessment: normal  Patient tolerated the procedure well with no immediate complications.

## 2017-11-22 NOTE — Addendum Note (Signed)
Addendum  created 11/22/17 1059 by Gaynelle AduFitzgerald, Johntavius Shepard, MD   Intraprocedure Event edited, Intraprocedure Staff edited

## 2017-11-23 ENCOUNTER — Encounter (HOSPITAL_COMMUNITY): Payer: Self-pay | Admitting: Interventional Radiology

## 2017-11-24 ENCOUNTER — Other Ambulatory Visit (HOSPITAL_COMMUNITY): Payer: Self-pay | Admitting: Interventional Radiology

## 2017-11-24 ENCOUNTER — Encounter (HOSPITAL_COMMUNITY): Payer: Self-pay | Admitting: Interventional Radiology

## 2017-11-24 DIAGNOSIS — I671 Cerebral aneurysm, nonruptured: Secondary | ICD-10-CM

## 2017-12-02 ENCOUNTER — Other Ambulatory Visit (HOSPITAL_COMMUNITY): Payer: Self-pay | Admitting: Interventional Radiology

## 2017-12-02 DIAGNOSIS — I671 Cerebral aneurysm, nonruptured: Secondary | ICD-10-CM

## 2017-12-08 ENCOUNTER — Encounter: Payer: Self-pay | Admitting: Neurology

## 2017-12-13 ENCOUNTER — Encounter: Payer: Self-pay | Admitting: Neurology

## 2017-12-13 ENCOUNTER — Ambulatory Visit (INDEPENDENT_AMBULATORY_CARE_PROVIDER_SITE_OTHER): Payer: Medicare Other | Admitting: Neurology

## 2017-12-13 VITALS — BP 126/69 | HR 49 | Ht 68.0 in | Wt 252.0 lb

## 2017-12-13 DIAGNOSIS — J449 Chronic obstructive pulmonary disease, unspecified: Secondary | ICD-10-CM | POA: Diagnosis not present

## 2017-12-13 DIAGNOSIS — Z9989 Dependence on other enabling machines and devices: Secondary | ICD-10-CM | POA: Diagnosis not present

## 2017-12-13 DIAGNOSIS — G4733 Obstructive sleep apnea (adult) (pediatric): Secondary | ICD-10-CM | POA: Diagnosis not present

## 2017-12-13 NOTE — Progress Notes (Signed)
SLEEP MEDICINE CLINIC   Provider:  Melvyn Novas, M D  Primary Care Physician:  Tracey Harries, MD   Referring Provider: Tracey Harries, MD  Via Dr. [name], MD- Dr. Pearlean Brownie and Dr. Roda Shutters    Chief Complaint  Patient presents with  . Follow-up    pt alone, rm 10. DME [employer]. pt states that CPAP is going well    HPI:  [name] is a 57 y.o. male , seen here as in a referral  from Dr. Everlene Other for a sleep evaluation.   Patient of Dr. Roda Shutters , who treated him for a stroke in 2015, followed up with Dr. Mateo Flow as well. Has been undergoing aneurysmata  stenting/ coiling with Kerby Nora, MD.  [name] is a 57 year old Caucasian right-handed male, who presents today to the sleep consultation with his mother. He stated that Dr. [name] spoke to him after his last interventional radiology procedure, which was meant to coil a nonbleeding aneurysm. He was told that his oxygen levels at night had dropped significantly and that the telemetry nurses were concerned. Dr. [name] asked the patient to contact his primary care physician about an urgent  sleep study.  Dr. Everlene Other, who treats him as his family doctor, quoted that the patient has periods of not breathing at night, awakening in the middle of the night because of shortness of breath, difficulties falling asleep once woken. He is not sure when the symptoms originally began. On July 30 [name] underwent embolization of a brain aneurysm without any complications. 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 The day after the procedure he was started on double antiplatelet therapy including Plavix and full size aspirin. He was asked to return in 14 days later to switch to [facility] was. It was during his hospitalization that his pulse ox decreased dramatically in the nighttime. The patient has associated comorbidities that make him prone for sleep apnea, and make sleep apnea a risk factor for future  strokes or heart attacks. The patient carries a diagnosis of diabetes mellitus type 2, diagnosed at age 71, hypercholesterolemia, atrial fibrillation, migraine and bipolar disease with depression. He also reported swelling in his legs, fluid water gain, and had periods of passing out, fainting. Increased thirst and joint pain.   Chief complaint according to patient : " I get low oxygen at night"  Sleep habits are as follows:He has a late sleep night usually goes to bed after midnight between 12:30 AM and 2:30 AM and awakens up to 4 times at night. He watches netflix before going to bed, his son is 45 and with him, and his son retreats to his won bedroom at about 10 PM.  The patient sleeps in a cool, quiet and [name] bedroom- after the TV is off.  He wakes frequently being short of breath, wheezing, and in distress. He wakes up every hour, and he has nocturia times 5 each night. He sleeps on 3 pillows, non adjustable bed. No oxygen use. Sleeps prone and on his side. Wakes at 7 AM gets his son to school. Averages only 5 hours - sleep deprived.   Sleep medical history and family sleep history: has been sleeping as described above since his stroke 2015, loss of his job.   family history of OSA, older sister on CPAP and o2, great grandson with sleep apnea.  Social history: The patient is disabled, not working and a single parent. Drove a truck. Smoking  started at age 25- quit in 05-2014.  ETOH use: used to drink 3 beers a week, not longer, caffeine ; 2 cuos a day in AM, the rest of the day it's water.    Interval history from 13 December 2017, I have the pleasure of meeting with [name] today, a 57 year old Caucasian right-handed gentleman who underwent a sleep study on 27 July 2017, for which she was referred after a stroke.  He also has significant hypoxemia.  His sleep apnea was mild to moderate with an AHI of 17.4/h, during REM sleep accentuated to 23.7/h and in supine sleep his apnea is exacerbated to  47.5/h.  He also had 52 minutes of oxygen desaturation in total in a sleep study but recorded 370 minutes of sleep.  He took bathroom breaks during the sleep study with a nadir of oxygen at 68%.  He was asked to return for CPAP titration on 14 August 2017.  CPAP corrected his apnea to 0.8/h and he was titrated to a total pressure of 12 cmH2O he is using a full face mask, small size with the name Simplus.  I also have the pleasure of seeing his first download today including 27 February he has been for 30 days 100% compliant with CPAP use, average use of time is 6 hours 49 minutes, average AHI is 3.1 at 12 cmH2O pressure, humidifier set at 3, ramp time is 20 minutes, C-Flex setting is 3 cm of water.  The needs to be no changes made, the patient's daytime sleepiness has significantly decreased to 6 points and his fatigue score to 41 points.  He also states that his nocturnal bathroom trips have become rare. He is rested and restored. His diabetes and HTN have been better controlled.   He is having cerebral aneurysm surgery on March 18 th and is nervous about this.   Review of Systems: Out of a complete 14 system review, the patient complains of only the following symptoms, and all other reviewed systems are negative.  During the daytime he no longer  excessively sleepy and endorsed today the Epworth sleepiness score at 6/ 24 points, but the fatigue severity score  at 41 points. Depression : feels not depressed, anxiety - yes. Has vascular neuro-surgery coming up.    Social History   Socioeconomic History  . Marital status: Divorced    Spouse name: not together since 2007  . Number of children: 3  . Years of education: 10  . Highest education level: Not on file  Social Needs  . Financial resource strain: Not on file  . Food insecurity - worry: Not on file  . Food insecurity - inability: Not on file  . Transportation needs - medical: Not on file  . Transportation needs - non-medical: Not on file   Occupational History  . 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 Smoking status: Former Smoker    Packs/day: 1.50    Years: 25.00    Pack years: 37.50    Types: Cigarettes    Start date: 01/15/1975    Last attempt to quit: 06/05/2014    Years since quitting: 3.5  . Smokeless tobacco: Never Used  Substance and Sexual Activity  . Alcohol use: Yes    Alcohol/week: 1.8 oz    Types: 3 Cans of beer per week    Comment: drinks 3 - 12oz bottles every monday, during shooting pool  . Drug use:  No  . Sexual activity: No    Partners: Female    Comment: partner has had surgery to prevent pregnancy  Other Topics Concern  . Not on file  Social History Narrative   Lives with his son and his mother.  His son has no contact with the son's mother, though the patient is not legally separated from her.  She has a history of drug use and has been in prison several times.    Family History  Problem Relation Age of Onset  . Heart disease Mother        s/p 3V CABG  . Cancer Father 25       lung cancer; +tobacco  . Stroke Father   . Cancer Maternal Grandmother   . Heart disease Maternal Grandmother   . Cancer Paternal Grandmother   . Lupus Sister   . Multiple sclerosis Sister   . Anemia Daughter     Past Medical History:  Diagnosis Date  . A-fib (HCC)   . Aneurysm (HCC)    brain  . Arthritis   . Atrial fibrillation (HCC)   . Bipolar disorder (HCC)    was on meds but was taken off 2 yrs ago and none since  . Burning pain    in both legs-seeing Dr.Sethi for this  . CAD (coronary artery disease)   . Cataracts, bilateral   . Childhood asthma    Last asthma attack at age 57; History of trach at 16 months (12/17/2016)  . Chronic kidney disease 2009  . Chronic lower back pain   . COPD (chronic obstructive pulmonary disease) (HCC) 06/2012   uses Spiriva and Albuterol daily as needed  . Depression   . Family history of adverse reaction to  anesthesia    "Mom and sister have PONV"  . GERD (gastroesophageal reflux disease)   . H/O hiatal hernia   . Headache(784.0)    "q 2-3 weeks; daily last 2 wks" (12/17/2016)  . Hyperlipidemia    takes Ramipril daily  . Hypertension    takes Ramipril daily  . Joint pain   . Joint swelling   . Middle cerebral artery aneurysm    right  . Nocturia   . Numbness    both arms   . Pneumonia    hx of-2014  . PONV (postoperative nausea and vomiting)    Pt reports nausea only.  . Sleep apnea    dx just this past week   08/21/2017  . Stroke [facility]) 2015   "drag left foot more since; have to wear glasses now" (12/17/2016)  . Type II diabetes mellitus (HCC)    takes Metformin daily   dx 2015  . Umbilical hernia   . Urinary frequency   . Wears dentures   . Wears glasses    and contact lenses   Attestation signed by Melene Plan, DO at 01/13/2017 3:21 PM  I have personally seen and examined the patient and discussed plan of care with the resident.  I was present for entire procedure.  I have reviewed the appropriate documentation on PMH/FH/Soc. History.  I have reviewed the documentation of the resident and agree.        EKG Interpretation  Date/Time:                  Tuesday January 12 2017 18:32:02 EDT Ventricular Rate:   110 PR Interval:  QRS Duration:        109 QT Interval:                      373 QTC Calculation:    505 R Axis:                         -13 Text Interpretation:  Atrial fibrillation Abnormal R-wave progression, early transition Prolonged QT interval Otherwise no significant change Confirmed by Adela LankFLOYD MD, Reuel BoomANIEL ([id number]) on 01/12/2017 6:42:25 PM       57 yo M With a chief complaint of symptomatic atrial fibrillation. Been going on for the past day. Patient is artery anticoagulated on eliquis.  The case was discussed with Dr. Sharyn LullHarwani who recommended cardioversion.  On my exam patient with a irregularly irregular heart rate well-appearing and  nontoxic. Clear lung sounds. No noted edema.  Chads2vasc of 2, already on eliquis.  D/c home.      Past Surgical History:  Procedure Laterality Date  . ANEURYSM COILING  2015  . CARDIOVERSION N/A 12/18/2016   Procedure: CARDIOVERSION;  Surgeon: Orpah CobbAjay Kadakia, MD;  [facility];  Service: Cardiovascular;  Laterality: N/A;  . COLONOSCOPY    . EXCISIONAL HEMORRHOIDECTOMY  1980s   "soon after rectal OR"  . GANGLION CYST EXCISION Right   . IR 3D INDEPENDENT WKST  05/10/2017  . IR 3D INDEPENDENT WKST  06/28/2017  . IR ANGIO INTRA EXTRACRAN SEL COM CAROTID INNOMINATE BILAT MOD SED  04/22/2017  . IR ANGIO INTRA EXTRACRAN SEL COM CAROTID INNOMINATE BILAT MOD SED  11/22/2017  . IR ANGIO INTRA EXTRACRAN SEL INTERNAL CAROTID UNI R MOD SED  05/10/2017  . IR ANGIO INTRA EXTRACRAN SEL INTERNAL CAROTID UNI R MOD SED  06/28/2017  . IR ANGIO VERTEBRAL SEL SUBCLAVIAN INNOMINATE UNI L MOD SED  04/22/2017  . IR ANGIO VERTEBRAL SEL SUBCLAVIAN INNOMINATE UNI R MOD SED  06/28/2017  . IR ANGIO VERTEBRAL SEL SUBCLAVIAN INNOMINATE UNI R MOD SED  11/22/2017  . IR ANGIO VERTEBRAL SEL VERTEBRAL UNI R MOD SED  04/22/2017  . IR ANGIOGRAM FOLLOW UP STUDY  05/10/2017  . IR ANGIOGRAM FOLLOW UP STUDY  06/28/2017  . IR NEURO EACH ADD'L AFTER BASIC UNI RIGHT (MS)  05/10/2017  . IR NEURO EACH ADD'L AFTER BASIC UNI RIGHT (MS)  06/28/2017  . IR RADIOLOGIST EVAL & MGMT  04/30/2017  . IR RADIOLOGIST EVAL & MGMT  06/15/2017  . IR RADIOLOGIST EVAL & MGMT  07/13/2017  . IR TRANSCATH/EMBOLIZ  05/10/2017  . IR TRANSCATH/EMBOLIZ  06/28/2017  . MULTIPLE TOOTH EXTRACTIONS    . RADIOLOGY WITH ANESTHESIA N/A 08/08/2014   Procedure: RADIOLOGY WITH ANESTHESIA EMBOLIZATION;  Surgeon: Medication Radiologist, MD;  Location: [facility];  Service: Radiology;  Laterality: N/A;  . RADIOLOGY WITH ANESTHESIA N/A 11/21/2014   Procedure: RADIOLOGY WITH ANESTHESIA;  Surgeon: Oneal GroutSanjeev K Deveshwar, MD;  Location: [facility] OR;  Service: Radiology;  Laterality: N/A;  .  RADIOLOGY WITH ANESTHESIA N/A 02/27/2015   Procedure: RADIOLOGY WITH ANESTHESIA;  Surgeon: Julieanne CottonSanjeev Deveshwar, MD;  Location: [facility] OR;  Service: Radiology;  Laterality: N/A;  . RADIOLOGY WITH ANESTHESIA N/A 09/25/2015   Procedure: RADIOLOGY WITH ANESTHESIA;  Surgeon: Julieanne CottonSanjeev Deveshwar, MD;  Location: [facility] OR;  Service: Radiology;  Laterality: N/A;  . RADIOLOGY WITH ANESTHESIA N/A 05/10/2017   Procedure: EMBOLIZATION;  Surgeon: Julieanne Cottoneveshwar, Sanjeev, MD;  Location: [facility];  Service: Radiology;  Laterality: N/A;  . RADIOLOGY WITH ANESTHESIA N/A  06/28/2017   Procedure: RADIOLOGY WITH ANESTHESIA-EMBOLIZATION;  Surgeon: [name], MD;  Location: [facility];  Service: Radiology;  Laterality: N/A;  . RADIOLOGY WITH ANESTHESIA N/A 08/26/2017   Procedure: RADIOLOGY WITH ANESTHESIA EMBOLIZATION;  Surgeon: [name], MD;  Location: [facility];  Service: Radiology;  Laterality: N/A;  . RADIOLOGY WITH ANESTHESIA N/A 11/22/2017   Procedure: EMBOLIZATION;  Surgeon: [name], MD;  Location: [facility];  Service: Radiology;  Laterality: N/A;  . RECTAL SURGERY  1980s   "tore it lifting heavy furniture; sewed it back together"  . SHOULDER ARTHROSCOPY WITH OPEN ROTATOR CUFF REPAIR Right 2016  . SHOULDER ARTHROSCOPY WITH ROTATOR CUFF REPAIR Left 1996  . SHOULDER OPEN ROTATOR CUFF REPAIR Left 1997   "took out 8inches of my collarbone"  . TEE WITHOUT CARDIOVERSION N/A 12/18/2016   Procedure: TRANSESOPHAGEAL ECHOCARDIOGRAM (TEE);  Surgeon: Orpah Cobb, MD;  Location: [facility] ENDOSCOPY;  Service: Cardiovascular;  Laterality: N/A;  . TRACHEOSTOMY  1964   at age 12 months old; for asthma  . TRACHEOSTOMY CLOSURE      Current Outpatient Medications  Medication Sig Dispense Refill  . albuterol (PROVENTIL HFA;VENTOLIN HFA) 108 (90 BASE) MCG/ACT inhaler Inhale 2 puffs into the lungs every 6 (six) hours as needed for wheezing or shortness of breath.     [name] amiodarone (PACERONE) 200 MG tablet Take 1 tablet (200 mg total) by  mouth daily. 30 tablet 1  . aspirin EC 81 MG tablet Take 1 tablet (81 mg total) daily by mouth.    . clopidogrel (PLAVIX) 75 MG tablet Take 0.5 tablets (37.5 mg total) daily by mouth. (Patient taking differently: Take 37.5 mg by mouth every other day. In the morning.)    . esomeprazole (NEXIUM) 40 MG capsule Take 40 mg by mouth at bedtime as needed (for acid reflux).     . furosemide (LASIX) 40 MG tablet Take 40 mg by mouth daily.    [name] gabapentin (NEURONTIN) 100 MG capsule TAKE 1 CAPSULE BY MOUTH THREE TIMES DAILY 270 capsule 3  . hydrochlorothiazide (HYDRODIURIL) 25 MG tablet Take 0.5 tablets (12.5 mg total) by mouth daily.    . metFORMIN (GLUCOPHAGE) 500 MG tablet Take 1 tablet (500 mg total) by mouth 2 (two) times daily with a meal. 60 tablet 0  . nitroGLYCERIN (NITROSTAT) 0.4 MG SL tablet Place 1 tablet (0.4 mg total) under the tongue every 5 (five) minutes x 3 doses as needed for chest pain. 25 tablet 1  . potassium chloride (K-DUR) 10 MEQ tablet Take 10 mEq by mouth daily.    [name] umeclidinium-vilanterol (ANORO ELLIPTA) 62.5-25 MCG/INH AEPB Inhale 1 puff into the lungs daily.      No current facility-administered medications for this visit.     Allergies as of 12/13/2017 - Review Complete 12/13/2017  Allergen Reaction Noted  . Tylox [oxycodone-acetaminophen] Hives, Other (See Comments), and Rash 10/19/2012  . Adhesive [tape] Rash 11/20/2014  Chief Complaint: Patient was seen in consultation today for aneurysm  Supervising Physician: [name]  Patient Status: [facility] - Out-pt  History of Present Illness: TERRENCE WISHON is a 57 y.o. malestatus post endovascular treatment of unruptured left middle cerebral artery aneurysm with stent assisted coiling, on 08/13/2014, followed by endovascular stent assisted angioplasty of the symptomatic right internal carotid artery stenosis with distal protection in December of 2016. Since this time he has continued with intermittent headaches.     He underwent CT Angio Head/Neck 03/29/17 which showed: 1. The right carotid bifurcation stent is patent.  2. Slight decreased size of the cavernous internal carotid artery on the left may reflect some flow limitation on the right. 3. 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 4. Left M1 segment and MCA bifurcation stent and aneurysm coils without evidence for residual or recurrent aneurysm or distal occlusion. 5. Stable mm right carotid bifurcation aneurysm. 6. 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  He underwent cerebral angiogram 04/22/17 which showed: 1.Obliterated previous Lt MCA aneurysm with patent adjacent stent. 2.Approx 4.52mm x 4.4 mm RT MCA bifurcation aneurysm 3.Approx 2.26mm x 1.11mm prox Lt ACA A 1 seg aneurysm  He presents today for cerebral angiogram with possible intervention.  Patient is aware degree of intervention will be based on findings.   He has been NPO.  He has held his Elliquis for 1 week.  Dr Fatima Sanger note from earlier this summer, 7-30- 2018 , stenting and coiling.    Vitals: BP 126/69   Pulse (!) 49   Ht 5\' 8"  (1.727 m)   Wt 252 lb (114.3 kg)   BMI 38.32 kg/m  Last Weight:  Wt Readings from Last 1 Encounters:  12/13/17 252 lb (114.3 kg)   WUJ:WJXB mass index is 38.32 kg/m.     Last Height:   Ht Readings from Last 1 Encounters:  12/13/17 5\' 8"  (1.727 m)    Physical exam:  General: The patient is awake, alert and appears not in acute distress. The patient is well groomed. Head: Normocephalic, atraumatic. Neck is supple. Mallampati 4,  neck circumference: 19 . Nasal airflow congested , TMJ click :not evident . Retrognathia is not seen. Prognathia .  Cardiovascular:  Regular rate and rhythm , without  murmurs or carotid bruit, and without distended neck veins. Respiratory: Lungs are clear to auscultation. Skin:  Without evidence of edema, or rash Trunk: BMI is 39. The patient's posture is  erect  Neurologic exam : The patient is awake and alert, oriented to place and time.   Memory subjective  described as intact.   MMSE - Mini Mental State Exam 08/29/2014  Orientation to time 5  Orientation to Place 5  Registration 3  Attention/ Calculation 5  Recall 2  Language- name 2 objects 2  Language- repeat 1  Language- follow 3 step command 3  Language- read & follow direction 1  Write a sentence 1  Copy design 1  Total score 29    Attention span & concentration ability appears normal.  Speech is fluent,  With dysphonia   Mood and affect are appropriate.  Cranial nerves: Pupils are equal and briskly reactive to light.  Extraocular movements  in vertical and horizontal planes intact and without nystagmus. Visual fields by finger perimetry are intact. Hearing to finger rub intact.  Facial motor strength is symmetric and tongue and uvula move midline. Shoulder shrug was symmetrical.   Motor exam: Normal tone, muscle bulk and symmetric strength in all extremities. Sensory:  Fine touch, pinprick and vibration were normal. No burning pain in either leg !  Coordination: Rapid alternating movements in the fingers/hands was normal. Finger-to-nose maneuver - without evidence of ataxia, dysmetria or tremor. Gait and station: Patient walks without assistive device . Deep tendon reflexes: in the upper and lower extremities are symmetric .   Assessment:  After physical and neurologic examination, review of laboratory studies,  Personal review of imaging studies, reports of other /same  Imaging studies, results of polysomnography and / or neurophysiology testing and  pre-existing records as far as provided in visit., my assessment is   1)Mr. Monforte was diagnosed with mild- moderate obstructive sleep apnea , and hypoxemia- OSA overlapped with COPD.  He noted after CPAP therapy a decrease in nocturia frequency, palpitations form atrial fibrillation . Significant benefit and compliance is  excellent 12 cm with 3 cm EPR>   The patient was advised of the nature of the diagnosed disorder , the treatment options and the  risks for general health and wellness arising from not treating the condition. He is anxious about the upcoming aneurysm coiling/ " clipping surgery" with Dr. [name].  I spent more than 20 minutes of face to face time with the patient.  Greater than 50% of time was spent in counseling and coordination of care. We have discussed the diagnosis and differential and I answered the patient's questions. We will meet in 12 month.  post Aneurysm follow up with Dr. Sylvie Farrier.    Melvyn Novas, MD   12/13/2017, 8:57 AM  Certified in Neurology by ABPN Certified in Sleep Medicine by [facility] [address], [address] [place], [place] [address]

## 2017-12-13 NOTE — Patient Instructions (Signed)

## 2017-12-15 ENCOUNTER — Encounter (HOSPITAL_COMMUNITY): Payer: Self-pay

## 2017-12-15 NOTE — Pre-Procedure Instructions (Signed)
[name]  12/15/2017      [facility] [id number] - [place] - [address] [facility] [address] [place] [id number] Phone: [phone] Fax: [phone]    Your procedure is scheduled on  Monday 12/27/17  [facility].M[facility]  Call this number if you have problems the morning of surgery:  [phone]   Remember:  Do not eat food or drink liquids after midnight.  Take these medicines the morning of surgery with A SIP OF WATER  Albuterol inhaler (bring with you)                  Amiodarone (pacerone)                  plavix                  nexium                  Gabapentin                   anoro ellipta inhaler    How to Manage Your Diabetes Before and After Surgery  Why is it important to control my blood sugar before and after surgery? . Improving blood sugar levels before and after surgery helps healing and can limit problems. . A way of improving blood sugar control is eating a healthy diet by: o  Eating less sugar and carbohydrates o  Increasing activity/exercise o  Talking with your doctor about reaching your blood sugar goals . High blood sugars (greater than 180 mg/dL) can raise your risk of infections and slow your recovery, so you will need to focus on controlling your diabetes during the weeks before surgery. . Make sure that the doctor who takes care of your diabetes knows about your planned surgery including the date and location.  How do I manage my blood sugar before surgery? . Check your blood sugar at least 4 times a day, starting 2 days before surgery, to make sure that the level is not too high or low. o Check your blood sugar the morning of your surgery when you wake up and every 2 hours until you get to the Short Stay unit. . If your blood sugar is less than 70 mg/dL, you will need to treat for low blood sugar: o Do not take insulin. o Treat a low  blood sugar (less than 70 mg/dL) with  cup of clear juice (cranberry or apple), 4 glucose tablets, OR glucose gel. Recheck blood sugar in 15 minutes after treatment (to make sure it is greater than 70 mg/dL). If your blood sugar is not greater than 70 mg/dL on recheck, call [phone] o  for further instructions. . Report your blood sugar to the short stay nurse when you get to Short Stay.  . If you are admitted to the hospital after surgery: o Your blood sugar will be checked by the staff and you will probably be given insulin after surgery (instead of oral diabetes medicines) to make sure you have good blood sugar levels. o The goal for blood sugar control after surgery is 80-180 mg/dL.              WHAT DO I DO ABOUT MY DIABETES MEDICATION?   [name]. Do not take oral diabetes medicines (pills) the morning of surgery.   Other Instructions:  Patient Signature:  Date:   Nurse Signature:  Date:   Reviewed and Endorsed by [facility] Patient Education Committee, August 2015 7 days prior to surgery STOP taking any Aleve, Naproxen, Ibuprofen, Motrin, Advil, Goody's, BC's, all herbal medications, fish oil, and all vitamins    Do not wear jewelry, make-up or nail polish.  Do not wear lotions, powders, or perfumes, or deodorant.  Do not shave 48 hours prior to surgery.  Men may shave face and neck.  Do not bring valuables to the hospital.  [facility] is not responsible for any belongings or valuables.  Contacts, dentures or bridgework may not be worn into surgery.  Leave your suitcase in the car.  After surgery it may be brought to your room.  For patients admitted to the hospital, discharge time will be determined by your treatment team.  Patients discharged the day of surgery will not be allowed to drive home.   Name and phone number of your driver:    Special instructions:  [place] - Preparing for Surgery  Before surgery, you can play an important role.   Because skin is not sterile, your skin needs to be as free of germs as possible.  You can reduce the number of germs on you skin by washing with CHG (chlorahexidine gluconate) soap before surgery.  CHG is an antiseptic cleaner which kills germs and bonds with the skin to continue killing germs even after washing.  Please DO NOT use if you have an allergy to CHG or antibacterial soaps.  If your skin becomes reddened/irritated stop using the CHG and inform your nurse when you arrive at Short Stay.  Do not shave (including legs and underarms) for at least 48 hours prior to the first CHG shower.  You may shave your face.  Please follow these instructions carefully:   1.  Shower with CHG Soap the night before surgery and the                                morning of Surgery.  2.  If you choose to wash your hair, wash your hair first as usual with your       normal shampoo.  3.  After you shampoo, rinse your hair and body thoroughly to remove the                      Shampoo.  4.  Use CHG as you would any other liquid soap.  You can apply chg directly       to the skin and wash gently with scrungie or a clean washcloth.  5.  Apply the CHG Soap to your body ONLY FROM THE NECK DOWN.        Do not use on open wounds or open sores.  Avoid contact with your eyes,       ears, mouth and genitals (private parts).  Wash genitals (private parts)       with your normal soap.  6.  Wash thoroughly, paying special attention to the area where your surgery        will be performed.  7.  Thoroughly rinse your body with warm water from the neck down.  8.  DO NOT shower/wash with your normal soap after using and rinsing off       the CHG Soap.  9.  Pat yourself dry with a clean towel.  10.  Wear clean pajamas.            11.  Place clean sheets on your bed the night of your first shower and do not        sleep with pets.  Day of Surgery  Do not apply any lotions/deoderants the morning of surgery.  Please wear  clean clothes to the hospital/surgery center.    Please read over the following fact sheets that you were given. Pain Booklet

## 2017-12-16 ENCOUNTER — Encounter (HOSPITAL_COMMUNITY): Payer: Self-pay

## 2017-12-16 ENCOUNTER — Other Ambulatory Visit: Payer: Self-pay | Admitting: Radiology

## 2017-12-16 ENCOUNTER — Other Ambulatory Visit: Payer: Self-pay

## 2017-12-16 ENCOUNTER — Encounter (HOSPITAL_COMMUNITY)
Admission: RE | Admit: 2017-12-16 | Discharge: 2017-12-16 | Disposition: A | Discharge status: Home / Self Care | Payer: Medicare Other | Source: Ambulatory Visit | Attending: Interventional Radiology | Admitting: Interventional Radiology

## 2017-12-16 DIAGNOSIS — Z01812 Encounter for preprocedural laboratory examination: Secondary | ICD-10-CM | POA: Insufficient documentation

## 2017-12-16 DIAGNOSIS — E119 Type 2 diabetes mellitus without complications: Secondary | ICD-10-CM | POA: Diagnosis not present

## 2017-12-16 LAB — BASIC METABOLIC PANEL
Anion gap: 10 (ref 5–15)
BUN: 11 mg/dL (ref 6–20)
CHLORIDE: 102 mmol/L (ref 101–111)
CO2: 27 mmol/L (ref 22–32)
Calcium: 9.2 mg/dL (ref 8.9–10.3)
Creatinine, Ser: 0.95 mg/dL (ref 0.61–1.24)
GFR calc Af Amer: >60 mL/min (ref >60)
GFR calc non Af Amer: >60 mL/min (ref >60)
Glucose, Bld: 97 mg/dL (ref 65–99)
POTASSIUM: 3.9 mmol/L (ref 3.5–5.1)
SODIUM: 139 mmol/L (ref 135–145)

## 2017-12-16 LAB — CBC
HCT: 42.4 % (ref 39.0–52.0)
Hemoglobin: 13.9 g/dL (ref 13.0–17.0)
MCH: 29.5 pg (ref 26.0–34.0)
MCHC: 32.8 g/dL (ref 30.0–36.0)
MCV: 90 fL (ref 78.0–100.0)
PLATELETS: 248 10*3/uL (ref 150–400)
RBC: 4.71 MIL/uL (ref 4.22–5.81)
RDW: 13.2 % (ref 11.5–15.5)
WBC: 6.1 10*3/uL (ref 4.0–10.5)

## 2017-12-16 LAB — PROTIME-INR
INR: 0.99
Prothrombin Time: 13 seconds (ref 11.4–15.2)

## 2017-12-16 LAB — PLATELET INHIBITION P2Y12: PLATELET FUNCTION P2Y12: 133 [PRU] — AB (ref 194–418)

## 2017-12-16 LAB — GLUCOSE, CAPILLARY: Glucose-Capillary: 94 mg/dL (ref 65–99)

## 2017-12-16 LAB — APTT: aPTT: 29 seconds (ref 24–36)

## 2017-12-16 NOTE — Progress Notes (Signed)
PCP - Dr. Everlene OtherBouska, [facility] Cardiologist - Dr. Sharyn LullHarwani  Chest x-ray - n/a EKG - 11/15/2017 Stress Test - 10/28/2017 ECHO - 12/18/2016 Cardiac Cath -   Sleep Study - 07/27/2017 CPAP - yes,   Fasting Blood Sugar - 90's Checks Blood Sugar 0 times a day  Blood Thinner Instructions: patient instructed to continue plavix and asa  Anesthesia review: yes, cardiac history.  Patient denies shortness of breath, fever, cough and chest pain at PAT appointment   Patient verbalized understanding of instructions that were given to them at the PAT appointment. Patient was also instructed that they will need to review over the PAT instructions again at home before surgery.

## 2017-12-20 ENCOUNTER — Other Ambulatory Visit: Payer: Self-pay | Admitting: Radiology

## 2017-12-20 NOTE — Progress Notes (Signed)
AnesthesiaChart Review: Patient is a 57 year old male posted for embolization on 12/27/17 by Dr. Julieanne CottonSanjeev Deveshwar. Pre-op diagnosis: aneurysm. Orders were still pending at that time of his PAT. 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 He had been transitioned from Eliquis to Plavix for these procedures.   History includes former smoker (quit '15), post-operative nausea, afib (s/p DCCV 12/18/16 and 01/12/17), hiatal hernia, GERD, DM2, HTN, HLD, CVA 06/2014, CKD, COPD, asthma, Bipolar disorder, depression, chronic back pain, moderate OSA (using CPAP). Has glasses and dentures. 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  - PCP is listed as Dr. Tracey Harriesavid Bouska [facility] Everywhere). - Cardiologist is Dr. Rinaldo CloudMohan Harwani.Last known office note was on 10/15/17 (scanned under Media tab, Correspondence, 11/22/17). A stress test was ordered due to chest pain which was non-ischemic, but EF 46%. Patient reported chest pain thought to be related to GERD as it resolved with Nexium.  - Vascular surgeon is Dr. Waverly Ferrarihristopher Dickson. He was seen on 10/27/17 for BLE edema and varicose veins. Venous U/S was negative for DVT.  Compression stockings recommended. - [facility]. Primarily sees Dr. Marvel PlanJindong Xu and Dr. Delia HeadyPramod Sethi. He was last seen by Dr. Porfirio Mylararmen Dohmeier on 12/13/17 for OSA follow-up.  Meds include albuterol, amiodarone, ASA (decreased from 325 mg to 81 mg 08/24/17), Lasix, Nexium, Plavix 75 mg 1/2  tab every other day, HCTZ, metformin, Nitro, KCl, Anoro Ellipta. Eliquis has been on hold while he is on Plavix and ASA.  BP 126/73   Pulse (!) 49   Temp 36.7 C (Oral)   Resp 20   Ht 5\' 8"  (1.727 m)   Wt 253 lb 9.6 oz (115 kg)   SpO2 99%   BMI 38.56 kg/m  Heart rate appears consistent with recent previous readings, 49-53 bpm.  EKG2/4/19: Sinus bradycardia at 52 bpm.  Premature ventricular complexes or fusion complexes.  Incomplete right bundle branch block.  Minimal voltage criteria for LVH, may be normal variant.   EKG 02/05/17 (Dr. Sharyn LullHarwani; scanned under Media tab, Correspondence, 05/10/17): SB at 53 bpm, LAD, incomplete right BBB, voltage criteria for LVH. Baseline wanderer in multiple leads, worse in I, II.  Nuclear stress test 10/27/17: IMPRESSION: 1. No reversible ischemia or infarction. 2. Normal left ventricular wall motion. 3. Left ventricular ejection fraction 46% 4. Non invasive risk stratification*: Moderate. The risk stratification is based on decreased ejection fraction and increased end systolic volume.  (For comparison, prior nuclear stress test on 04/03/16 showed EF 46% and on 02/03/10 EF 43%.)  TEE (done with DCCV) 12/18/16:Study Conclusions - Left ventricle: Systolic function was normal. Wall motion was normal; there were no regional wall motion abnormalities. - Mitral valve: There was mild regurgitation directed centrally. - Left atrium: No evidence of thrombus in the atrial cavity or appendage. - Right atrium: No evidence of thrombus in the atrial cavity or appendage. - Atrial septum: There was a patent foramen ovale by sonicated saline injection and pressure on abdomen. IMPRESSION: 1. Normal LV systolic function. 2. PFO by saline injection. 3. No clot in LA  or appendage RECOMMENDATIONS: Patient underwent 120 J biphasic shock x one with successful cardioversion. IV amiodarone resumed.  Echo (TTE) 12/18/16: Study Conclusions - Left  ventricle: The cavity size was normal. Systolic function was normal. The estimated ejection fraction was in the range of 50% to 55%. Wall motion was normal; there were no regional wall motion abnormalities. - Left atrium: The atrium was mildly dilated.  BLE venous Duplex 10/27/17: Impression: No evidence of deep or superficial thrombus noted in the vessels listed.  Cerebral angiogram/bilateral common carotid arteriogram 11/22/17: IMPRESSION: - Stable appearing staged embolization of right MCA bifurcation aneurysm measuring 2.4 mm x 2 mm. - Stable appearing left middle cerebral artery stent assisted embolization. 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  CTA head/neck 04/08/17:IMPRESSION: 1. The right carotid bifurcation stent is patent. 2. Slight decreased size of the cavernous internal carotid artery on the left may reflect some flow limitation on the right. 3. 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 4. Left M1 segment and MCA bifurcation stent and aneurysm coils without evidence for residual or recurrent aneurysm or distal occlusion. 5. Stable mm right carotid bifurcation aneurysm. 6. 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  CXR 01/12/17: IMPRESSION: No active cardiopulmonary disease.  Labs from 12/16/17 noted. CBC and BMET WNL. P2y12 133. PT/PTT WNL. A1c was 5.2 on 10/27/17. Defer to IR if any additional pre-procedural labs are indicated.   Patient with recent non-ischemic stress test. EF is similar to prior stress test results. EF by echo ~ 50-55% last year. Patient denied on-going chest symptoms. He has not had any known recurrent afib.If no acute changes then I anticipate that he can proceed as planned from an anesthesia standpoint.  Velna Ochs [facility] Short Stay Center/Anesthesiology Phone [phone] 12/20/2017 2:58 PM

## 2017-12-26 NOTE — Anesthesia Preprocedure Evaluation (Addendum)
Anesthesia Evaluation  Patient identified by MRN, date of birth, ID band Patient awake    Reviewed: Allergy & Precautions, H&P , NPO status , Patient's Chart, lab work & pertinent test results  History of Anesthesia Complications (+) PONV  Airway Mallampati: III  TM Distance: >3 FB Neck ROM: Full    Dental no notable dental hx. (+) Edentulous Upper, Edentulous Lower, Dental Advisory Given   Pulmonary asthma , sleep apnea and Continuous Positive Airway Pressure Ventilation , COPD, former smoker,  CPAP-27 February he has been for 30 days 100% compliant with CPAP use, average use of time is 6 hours 49 minutes, average AHI is 3.1 at 12 cmH2O pressure, humidifier set at 3, ramp time is 20 minutes, C-Flex setting is 3 cm of water.  The needs to be no changes made, the patient's daytime sleepiness has significantly decreased to 6 points and his fatigue score to 41 points.    Pulmonary exam normal breath sounds clear to auscultation       Cardiovascular Exercise Tolerance: Good hypertension, + CAD and + Peripheral Vascular Disease  + dysrhythmias Atrial Fibrillation  Rhythm:Regular Rate:Normal    Systolic function was normal. Wall motion was normal; there were no regional wall motion abnormalities.   Neuro/Psych  Headaches, Depression Bipolar Disorder TIACVA    GI/Hepatic Neg liver ROS, GERD  ,  Endo/Other  diabetes, Type 2, Oral Hypoglycemic Agents  Renal/GU Renal InsufficiencyRenal diseasenegative Renal ROS  negative genitourinary   Musculoskeletal  (+) Arthritis , Osteoarthritis,    Abdominal (+) + obese,   Peds  Hematology negative hematology ROS (+)   Anesthesia Other Findings   Reproductive/Obstetrics negative OB ROS                            Lab Results  Component Value Date   WBC 6.1 12/16/2017   HGB 13.9 12/16/2017   HCT 42.4 12/16/2017   MCV 90.0 12/16/2017   PLT 248 12/16/2017   Lab  Results  Component Value Date   CREATININE 0.95 12/16/2017   BUN 11 12/16/2017   NA 139 12/16/2017   K 3.9 12/16/2017   CL 102 12/16/2017   CO2 27 12/16/2017    Anesthesia Physical  Anesthesia Plan  ASA: III  Anesthesia Plan: General   Post-op Pain Management:    Induction: Intravenous  PONV Risk Score and Plan: 3 and Ondansetron, Dexamethasone, Midazolam and Treatment may vary due to age or medical condition  Airway Management Planned: Oral ETT  Additional Equipment: Arterial line  Intra-op Plan:   Post-operative Plan: Extubation in OR  Informed Consent: I have reviewed the patients History and Physical, chart, labs and discussed the procedure including the risks, benefits and alternatives for the proposed anesthesia with the patient or authorized representative who has indicated his/her understanding and acceptance.   Dental advisory given  Plan Discussed with: CRNA  Anesthesia Plan Comments:        Anesthesia Quick Evaluation

## 2017-12-27 ENCOUNTER — Encounter (HOSPITAL_COMMUNITY)
Admission: AD | Disposition: A | Discharge status: Home / Self Care | Payer: Self-pay | Source: Ambulatory Visit | Attending: Interventional Radiology

## 2017-12-27 ENCOUNTER — Encounter (HOSPITAL_COMMUNITY): Payer: Self-pay | Admitting: *Deleted

## 2017-12-27 ENCOUNTER — Ambulatory Visit (HOSPITAL_COMMUNITY): Payer: Medicare Other | Admitting: Emergency Medicine

## 2017-12-27 ENCOUNTER — Inpatient Hospital Stay (HOSPITAL_COMMUNITY)
Admission: AD | Admit: 2017-12-27 | Discharge: 2017-12-28 | DRG: 027 | Disposition: A | Discharge status: Home / Self Care | Payer: Medicare Other | Source: Ambulatory Visit | Attending: Interventional Radiology | Admitting: Interventional Radiology

## 2017-12-27 ENCOUNTER — Ambulatory Visit (HOSPITAL_COMMUNITY)
Admission: RE | Admit: 2017-12-27 | Discharge: 2017-12-27 | Disposition: A | Discharge status: Home / Self Care | Payer: Medicare Other | Source: Ambulatory Visit | Attending: Interventional Radiology | Admitting: Interventional Radiology

## 2017-12-27 ENCOUNTER — Ambulatory Visit (HOSPITAL_COMMUNITY): Payer: Medicare Other | Admitting: Certified Registered"

## 2017-12-27 ENCOUNTER — Encounter (HOSPITAL_COMMUNITY): Payer: Self-pay

## 2017-12-27 DIAGNOSIS — K219 Gastro-esophageal reflux disease without esophagitis: Secondary | ICD-10-CM | POA: Diagnosis present

## 2017-12-27 DIAGNOSIS — E669 Obesity, unspecified: Secondary | ICD-10-CM | POA: Diagnosis present

## 2017-12-27 DIAGNOSIS — M545 Low back pain: Secondary | ICD-10-CM | POA: Diagnosis present

## 2017-12-27 DIAGNOSIS — R51 Headache: Secondary | ICD-10-CM | POA: Diagnosis present

## 2017-12-27 DIAGNOSIS — I739 Peripheral vascular disease, unspecified: Secondary | ICD-10-CM | POA: Diagnosis present

## 2017-12-27 DIAGNOSIS — G8929 Other chronic pain: Secondary | ICD-10-CM | POA: Diagnosis present

## 2017-12-27 DIAGNOSIS — Z91048 Other nonmedicinal substance allergy status: Secondary | ICD-10-CM

## 2017-12-27 DIAGNOSIS — Z7902 Long term (current) use of antithrombotics/antiplatelets: Secondary | ICD-10-CM | POA: Diagnosis not present

## 2017-12-27 DIAGNOSIS — I129 Hypertensive chronic kidney disease with stage 1 through stage 4 chronic kidney disease, or unspecified chronic kidney disease: Secondary | ICD-10-CM | POA: Diagnosis present

## 2017-12-27 DIAGNOSIS — M1990 Unspecified osteoarthritis, unspecified site: Secondary | ICD-10-CM | POA: Diagnosis present

## 2017-12-27 DIAGNOSIS — G4730 Sleep apnea, unspecified: Secondary | ICD-10-CM | POA: Diagnosis present

## 2017-12-27 DIAGNOSIS — Z9189 Other specified personal risk factors, not elsewhere classified: Secondary | ICD-10-CM

## 2017-12-27 DIAGNOSIS — I4891 Unspecified atrial fibrillation: Secondary | ICD-10-CM | POA: Diagnosis present

## 2017-12-27 DIAGNOSIS — E1151 Type 2 diabetes mellitus with diabetic peripheral angiopathy without gangrene: Secondary | ICD-10-CM | POA: Diagnosis present

## 2017-12-27 DIAGNOSIS — E1122 Type 2 diabetes mellitus with diabetic chronic kidney disease: Secondary | ICD-10-CM | POA: Diagnosis present

## 2017-12-27 DIAGNOSIS — Z7982 Long term (current) use of aspirin: Secondary | ICD-10-CM

## 2017-12-27 DIAGNOSIS — Z8673 Personal history of transient ischemic attack (TIA), and cerebral infarction without residual deficits: Secondary | ICD-10-CM | POA: Diagnosis not present

## 2017-12-27 DIAGNOSIS — Z7984 Long term (current) use of oral hypoglycemic drugs: Secondary | ICD-10-CM

## 2017-12-27 DIAGNOSIS — R402413 Glasgow coma scale score 13-15, at hospital admission: Secondary | ICD-10-CM | POA: Diagnosis present

## 2017-12-27 DIAGNOSIS — I671 Cerebral aneurysm, nonruptured: Secondary | ICD-10-CM | POA: Diagnosis present

## 2017-12-27 DIAGNOSIS — I2510 Atherosclerotic heart disease of native coronary artery without angina pectoris: Secondary | ICD-10-CM | POA: Diagnosis present

## 2017-12-27 DIAGNOSIS — Z79899 Other long term (current) drug therapy: Secondary | ICD-10-CM | POA: Diagnosis not present

## 2017-12-27 DIAGNOSIS — Z87891 Personal history of nicotine dependence: Secondary | ICD-10-CM | POA: Diagnosis not present

## 2017-12-27 DIAGNOSIS — N189 Chronic kidney disease, unspecified: Secondary | ICD-10-CM | POA: Diagnosis present

## 2017-12-27 DIAGNOSIS — J449 Chronic obstructive pulmonary disease, unspecified: Secondary | ICD-10-CM | POA: Diagnosis present

## 2017-12-27 DIAGNOSIS — Z885 Allergy status to narcotic agent status: Secondary | ICD-10-CM

## 2017-12-27 DIAGNOSIS — Z6838 Body mass index (BMI) 38.0-38.9, adult: Secondary | ICD-10-CM

## 2017-12-27 HISTORY — PX: IR NEURO EACH ADD'L AFTER BASIC UNI LEFT (MS): IMG5373

## 2017-12-27 HISTORY — PX: IR TRANSCATH/EMBOLIZ: IMG695

## 2017-12-27 HISTORY — PX: IR ANGIOGRAM FOLLOW UP STUDY: IMG697

## 2017-12-27 HISTORY — PX: IR ANGIO INTRA EXTRACRAN SEL INTERNAL CAROTID UNI L MOD SED: IMG5361

## 2017-12-27 HISTORY — PX: RADIOLOGY WITH ANESTHESIA: SHX6223

## 2017-12-27 LAB — COMPREHENSIVE METABOLIC PANEL
ALBUMIN: 3.7 g/dL (ref 3.5–5.0)
ALK PHOS: 53 U/L (ref 38–126)
ALT: 17 U/L (ref 17–63)
AST: 17 U/L (ref 15–41)
Anion gap: 10 (ref 5–15)
BUN: 11 mg/dL (ref 6–20)
CALCIUM: 8.9 mg/dL (ref 8.9–10.3)
CO2: 24 mmol/L (ref 22–32)
Chloride: 104 mmol/L (ref 101–111)
Creatinine, Ser: 0.92 mg/dL (ref 0.61–1.24)
GFR calc Af Amer: >60 mL/min (ref >60)
GFR calc non Af Amer: >60 mL/min (ref >60)
GLUCOSE: 107 mg/dL — AB (ref 65–99)
Potassium: 3.6 mmol/L (ref 3.5–5.1)
Sodium: 138 mmol/L (ref 135–145)
TOTAL PROTEIN: 6.4 g/dL — AB (ref 6.5–8.1)
Total Bilirubin: 0.6 mg/dL (ref 0.3–1.2)

## 2017-12-27 LAB — CBC WITH DIFFERENTIAL/PLATELET
BASOS ABS: 0 10*3/uL (ref 0.0–0.1)
BASOS PCT: 0 %
Eosinophils Absolute: 0.2 10*3/uL (ref 0.0–0.7)
Eosinophils Relative: 3 %
HCT: 37 % — ABNORMAL LOW (ref 39.0–52.0)
HEMOGLOBIN: 12.1 g/dL — AB (ref 13.0–17.0)
Lymphocytes Relative: 33 %
Lymphs Abs: 2.1 10*3/uL (ref 0.7–4.0)
MCH: 28.9 pg (ref 26.0–34.0)
MCHC: 32.7 g/dL (ref 30.0–36.0)
MCV: 88.3 fL (ref 78.0–100.0)
Monocytes Absolute: 0.5 10*3/uL (ref 0.1–1.0)
Monocytes Relative: 7 %
NEUTROS ABS: 3.7 10*3/uL (ref 1.7–7.7)
NEUTROS PCT: 57 %
Platelets: 229 10*3/uL (ref 150–400)
RBC: 4.19 MIL/uL — AB (ref 4.22–5.81)
RDW: 13 % (ref 11.5–15.5)
WBC: 6.5 10*3/uL (ref 4.0–10.5)

## 2017-12-27 LAB — PREPARE PLATELET PHERESIS: Unit division: 0

## 2017-12-27 LAB — BPAM PLATELET PHERESIS
Blood Product Expiration Date: 201903182359
UNIT TYPE AND RH: 6200

## 2017-12-27 LAB — MRSA PCR SCREENING: MRSA by PCR: NEGATIVE

## 2017-12-27 LAB — GLUCOSE, CAPILLARY
Glucose-Capillary: 105 mg/dL — ABNORMAL HIGH (ref 65–99)
Glucose-Capillary: 95 mg/dL (ref 65–99)

## 2017-12-27 LAB — POCT ACTIVATED CLOTTING TIME
ACTIVATED CLOTTING TIME: 158 s
ACTIVATED CLOTTING TIME: 164 s
ACTIVATED CLOTTING TIME: 186 s
ACTIVATED CLOTTING TIME: 147 s
Activated Clotting Time: 197 seconds

## 2017-12-27 LAB — PLATELET INHIBITION P2Y12: Platelet Function  P2Y12: 90 [PRU] — ABNORMAL LOW (ref 194–418)

## 2017-12-27 LAB — TYPE AND SCREEN
ABO/RH(D): A POS
ANTIBODY SCREEN: NEGATIVE

## 2017-12-27 LAB — HEPARIN LEVEL (UNFRACTIONATED): Heparin Unfractionated: <0.1 IU/mL — ABNORMAL LOW (ref 0.30–0.70)

## 2017-12-27 LAB — ABO/RH: ABO/RH(D): A POS

## 2017-12-27 LAB — APTT: APTT: 28 s (ref 24–36)

## 2017-12-27 LAB — PROTIME-INR
INR: 1.01
Prothrombin Time: 13.2 seconds (ref 11.4–15.2)

## 2017-12-27 SURGERY — IR WITH ANESTHESIA
Anesthesia: General

## 2017-12-27 MED ORDER — CEFAZOLIN SODIUM-DEXTROSE 2-4 GM/100ML-% IV SOLN
2.0000 g | INTRAVENOUS | Status: AC
  Administered 2017-12-27: 2 g via INTRAVENOUS
  Filled 2017-12-27: qty 100

## 2017-12-27 MED ORDER — NICARDIPINE HCL IN NACL 20-0.86 MG/200ML-% IV SOLN
0.0000 mg/h | INTRAVENOUS | Status: DC
  Administered 2017-12-27 – 2017-12-28 (×3): 5 mg/h via INTRAVENOUS
  Filled 2017-12-27 (×4): qty 200

## 2017-12-27 MED ORDER — FENTANYL CITRATE (PF) 100 MCG/2ML IJ SOLN
INTRAMUSCULAR | Status: DC | PRN
  Administered 2017-12-27 (×2): 50 ug via INTRAVENOUS

## 2017-12-27 MED ORDER — PHENYLEPHRINE HCL 10 MG/ML IJ SOLN
INTRAVENOUS | Status: DC | PRN
  Administered 2017-12-27: 20 ug/min via INTRAVENOUS

## 2017-12-27 MED ORDER — ACETAMINOPHEN 10 MG/ML IV SOLN: 1000.0000 mg | Freq: Once | INTRAVENOUS | Status: DC

## 2017-12-27 MED ORDER — ASPIRIN EC 325 MG PO TBEC
325.0000 mg | DELAYED_RELEASE_TABLET | ORAL | Status: DC
  Filled 2017-12-27: qty 1

## 2017-12-27 MED ORDER — SUGAMMADEX SODIUM 200 MG/2ML IV SOLN
INTRAVENOUS | Status: DC | PRN
  Administered 2017-12-27: 200 mg via INTRAVENOUS

## 2017-12-27 MED ORDER — HEPARIN (PORCINE) IN NACL 100-0.45 UNIT/ML-% IJ SOLN
750.0000 [IU]/h | INTRAMUSCULAR | Status: AC
  Filled 2017-12-27: qty 250

## 2017-12-27 MED ORDER — SODIUM CHLORIDE 0.9 % IV SOLN
INTRAVENOUS | Status: DC
  Administered 2017-12-27: 13:00:00 via INTRAVENOUS

## 2017-12-27 MED ORDER — CLOPIDOGREL BISULFATE 75 MG PO TABS
ORAL_TABLET | ORAL | Status: AC
  Filled 2017-12-27: qty 1

## 2017-12-27 MED ORDER — HEPARIN (PORCINE) IN NACL 100-0.45 UNIT/ML-% IJ SOLN
500.0000 [IU]/h | INTRAMUSCULAR | Status: DC
  Administered 2017-12-27: 500 [IU]/h via INTRAVENOUS
  Filled 2017-12-27: qty 250

## 2017-12-27 MED ORDER — IOPAMIDOL (ISOVUE-300) INJECTION 61%
INTRAVENOUS | Status: AC
Start: 2017-12-27 — End: 2017-12-27
  Administered 2017-12-27: 10 mL
  Filled 2017-12-27: qty 50

## 2017-12-27 MED ORDER — EPHEDRINE SULFATE 50 MG/ML IJ SOLN
INTRAMUSCULAR | Status: DC | PRN
  Administered 2017-12-27: 5 mg via INTRAVENOUS

## 2017-12-27 MED ORDER — ACETAMINOPHEN 160 MG/5ML PO SOLN: 650.0000 mg | ORAL | Status: DC | PRN

## 2017-12-27 MED ORDER — IOPAMIDOL (ISOVUE-300) INJECTION 61%
INTRAVENOUS | Status: AC
  Administered 2017-12-27: 21 mL
  Filled 2017-12-27: qty 150

## 2017-12-27 MED ORDER — CLOPIDOGREL BISULFATE 75 MG PO TABS: 75.0000 mg | ORAL_TABLET | Freq: Every day | ORAL | Status: DC

## 2017-12-27 MED ORDER — LACTATED RINGERS IV SOLN
INTRAVENOUS | Status: DC | PRN
  Administered 2017-12-27 (×3): via INTRAVENOUS

## 2017-12-27 MED ORDER — SODIUM CHLORIDE 0.9 % IV SOLN: INTRAVENOUS | Status: DC

## 2017-12-27 MED ORDER — HEPARIN SODIUM (PORCINE) 1000 UNIT/ML IJ SOLN
INTRAMUSCULAR | Status: DC | PRN
  Administered 2017-12-27 (×2): 1000 [IU] via INTRAVENOUS
  Administered 2017-12-27: 500 [IU] via INTRAVENOUS
  Administered 2017-12-27: 2500 [IU] via INTRAVENOUS
  Administered 2017-12-27: 1000 [IU] via INTRAVENOUS

## 2017-12-27 MED ORDER — FENTANYL CITRATE (PF) 100 MCG/2ML IJ SOLN
INTRAMUSCULAR | Status: AC
  Administered 2017-12-27: 50 ug via INTRAVENOUS
  Filled 2017-12-27: qty 2

## 2017-12-27 MED ORDER — HEPARIN (PORCINE) IN NACL 100-0.45 UNIT/ML-% IJ SOLN
500.0000 [IU]/h | INTRAMUSCULAR | Status: DC
  Filled 2017-12-27: qty 250

## 2017-12-27 MED ORDER — PROPOFOL 10 MG/ML IV BOLUS
INTRAVENOUS | Status: DC | PRN
  Administered 2017-12-27: 200 mg via INTRAVENOUS

## 2017-12-27 MED ORDER — LIDOCAINE HCL (CARDIAC) 20 MG/ML IV SOLN
INTRAVENOUS | Status: DC | PRN
  Administered 2017-12-27: 100 mg via INTRAVENOUS

## 2017-12-27 MED ORDER — CLOPIDOGREL BISULFATE 75 MG PO TABS: 75.0000 mg | ORAL_TABLET | ORAL | Status: DC

## 2017-12-27 MED ORDER — MIDAZOLAM HCL 5 MG/5ML IJ SOLN
INTRAMUSCULAR | Status: DC | PRN
  Administered 2017-12-27 (×4): 1 mg via INTRAVENOUS

## 2017-12-27 MED ORDER — EPTIFIBATIDE 20 MG/10ML IV SOLN
INTRAVENOUS | Status: AC
  Filled 2017-12-27: qty 10

## 2017-12-27 MED ORDER — ACETAMINOPHEN 325 MG PO TABS
650.0000 mg | ORAL_TABLET | ORAL | Status: DC | PRN
  Administered 2017-12-27: 650 mg via ORAL
  Filled 2017-12-27: qty 2

## 2017-12-27 MED ORDER — ASPIRIN 81 MG PO CHEW: 81.0000 mg | CHEWABLE_TABLET | Freq: Every day | ORAL | Status: DC

## 2017-12-27 MED ORDER — ROCURONIUM BROMIDE 100 MG/10ML IV SOLN
INTRAVENOUS | Status: DC | PRN
  Administered 2017-12-27: 60 mg via INTRAVENOUS

## 2017-12-27 MED ORDER — IOPAMIDOL (ISOVUE-300) INJECTION 61%
INTRAVENOUS | Status: AC
  Administered 2017-12-27: 75 mL
  Filled 2017-12-27: qty 150

## 2017-12-27 MED ORDER — HEPARIN (PORCINE) IN NACL 100-0.45 UNIT/ML-% IJ SOLN
INTRAMUSCULAR | Status: AC
  Administered 2017-12-27: 500 [IU]/h via INTRAVENOUS
  Filled 2017-12-27: qty 250

## 2017-12-27 MED ORDER — LIDOCAINE HCL 1 % IJ SOLN
INTRAMUSCULAR | Status: AC
  Filled 2017-12-27: qty 20

## 2017-12-27 MED ORDER — NITROGLYCERIN 1 MG/10 ML FOR IR/CATH LAB
INTRA_ARTERIAL | Status: AC
  Filled 2017-12-27: qty 10

## 2017-12-27 MED ORDER — FENTANYL CITRATE (PF) 100 MCG/2ML IJ SOLN
25.0000 ug | INTRAMUSCULAR | Status: DC | PRN
  Administered 2017-12-27: 50 ug via INTRAVENOUS

## 2017-12-27 MED ORDER — NIMODIPINE 30 MG PO CAPS
0.0000 mg | ORAL_CAPSULE | ORAL | Status: DC
  Filled 2017-12-27: qty 2

## 2017-12-27 MED ORDER — CLOPIDOGREL BISULFATE 75 MG PO TABS
75.0000 mg | ORAL_TABLET | Freq: Every day | ORAL | Status: DC
  Administered 2017-12-27 – 2017-12-28 (×2): 75 mg via ORAL
  Filled 2017-12-27 (×2): qty 1

## 2017-12-27 MED ORDER — CLOPIDOGREL BISULFATE 75 MG PO TABS
ORAL_TABLET | ORAL | Status: AC | PRN
  Administered 2017-12-27: 75 mg via ORAL

## 2017-12-27 MED ORDER — EPTIFIBATIDE 20 MG/10ML IV SOLN
INTRAVENOUS | Status: AC | PRN
  Administered 2017-12-27 (×2): 1.5 mg via INTRAVENOUS

## 2017-12-27 MED ORDER — ACETAMINOPHEN 650 MG RE SUPP: 650.0000 mg | RECTAL | Status: DC | PRN

## 2017-12-27 MED ORDER — IOPAMIDOL (ISOVUE-300) INJECTION 61%
INTRAVENOUS | Status: AC
  Filled 2017-12-27: qty 150

## 2017-12-27 MED ORDER — ASPIRIN 81 MG PO CHEW
81.0000 mg | CHEWABLE_TABLET | Freq: Every day | ORAL | Status: DC
  Administered 2017-12-28: 81 mg via ORAL
  Filled 2017-12-27: qty 1

## 2017-12-27 MED ORDER — ONDANSETRON HCL 4 MG/2ML IJ SOLN
INTRAMUSCULAR | Status: DC | PRN
  Administered 2017-12-27: 4 mg via INTRAVENOUS

## 2017-12-27 NOTE — Anesthesia Procedure Notes (Signed)
Procedure Name: Intubation Date/Time: 12/27/2017 9:52 AM Performed by: Josie Dixon, CRNA Pre-anesthesia Checklist: Patient identified, Emergency Drugs available, Suction available and Patient being monitored Patient Re-evaluated:Patient Re-evaluated prior to induction Oxygen Delivery Method: Circle system utilized Preoxygenation: Pre-oxygenation with 100% oxygen Induction Type: IV induction Ventilation: Mask ventilation without difficulty and Oral airway inserted - appropriate to patient size Laryngoscope Size: Mac and 4 Grade View: Grade I Tube type: Oral Number of attempts: 1 Airway Equipment and Method: Stylet Placement Confirmation: ETT inserted through vocal cords under direct vision,  positive ETCO2 and breath sounds checked- equal and bilateral Secured at: 20 cm Tube secured with: Tape Dental Injury: Teeth and Oropharynx as per pre-operative assessment

## 2017-12-27 NOTE — Transfer of Care (Signed)
Immediate Anesthesia Transfer of Care Note  Patient: [name]  Procedure(s) Performed: EMBOLIZATION (N/A )  Patient Location: PACU  Anesthesia Type:General  Level of Consciousness: awake, alert  and oriented  Airway & Oxygen Therapy: Patient Spontanous Breathing  Post-op Assessment: Post -op Vital signs reviewed and stable  Post vital signs: stable  Last Vitals:  Vitals:   12/27/17 0803 12/27/17 1150  BP: (!) 134/56   Pulse: (!) 53   Temp:  36.7 C    Last Pain:  Vitals:   12/27/17 1150  PainSc: (P) 0-No pain      Patients Stated Pain Goal: (pt. stated "I don't have pain") (12/27/17 0701)  Complications: No apparent anesthesia complications

## 2017-12-27 NOTE — Progress Notes (Signed)
.  Patient ID: [name], male   DOB: 10/10/1961, 57 y.o.   MRN: [id number]   Post procedure. No complaints. Sitting up drinking and eating. Denies any H/As,N/V,visual ,motor or sensory symptoms. Denies chest pains or SOB. On exam  VS BP 1202 to 130s /70s HR 80s SR PAO2 >95% RA. Neurologically alert, awake oriented to time ,place and space.[name].  Speech and comprehension clear.[name].  PEARLA..3mm rt =Lt  EOMS full..No nystagmus  Visual Fields.Full to confrontation  No Facial asymmetry.  Tongue Midline..  Motor..           NO drift of outstretched arms..          Power.5/5 Proximally and distally all four extremities..  Fine motor and coordination to finger to nose equal..  Gait Not tested  Romberg Not tested A  Heel to toe.. Not tested  Rt groin soft.No bleeding or hematoma.  Pulses Both DPs and PTs palpable.  Plan . Continue on IV heparin .Close BP and neuro monitoring. Advance diet as tolerated. S.Jamesyn Lindell MD

## 2017-12-27 NOTE — H&P (Signed)
Supervising Physician: [name]  Patient Status: [facility] - Out-pt  History of Present Illness: [name] is a 57 y.o. male with hx of cerebral aneurysms. Previously treated (R)MCA bifurcation aneurysm was noted to have continued filling on last angiogram. Pt scheduled for repeat angio with intervention today. Pt feels well, no specific neuro c/o Denies recent fevers, chills, illness. Denies CP, SOB, palpitations. Has been NPO this am except his normal am meds. Mother and sister present with him today.  Past Medical History:  Diagnosis Date  . A-fib (HCC)   . Aneurysm (HCC)    brain  . Arthritis   . Atrial fibrillation (HCC)   . Bipolar disorder (HCC)    was on meds but was taken off 2 yrs ago and none since  . Burning pain    in both legs-seeing Dr.Sethi for this  . CAD (coronary artery disease)   . Cataracts, bilateral   . Childhood asthma    Last asthma attack at age 8; History of trach at 16 months (12/17/2016)  . Chronic kidney disease 2009  . Chronic lower back pain   . COPD (chronic obstructive pulmonary disease) (HCC) 06/2012   uses Spiriva and Albuterol daily as needed  . Depression   . Family history of adverse reaction to anesthesia    "Mom and sister have PONV"  . GERD (gastroesophageal reflux disease)   . H/O hiatal hernia   . Headache(784.0)    "q 2-3 weeks; daily last 2 wks" (12/17/2016)  . Hyperlipidemia    takes Ramipril daily  . Hypertension    takes Ramipril daily  . Joint pain   . Joint swelling   . Middle cerebral artery aneurysm    right  . Nocturia   . Numbness    both arms   . Pneumonia    hx of-2014  . PONV (postoperative nausea and vomiting)    Pt reports nausea only.  . Sleep apnea    dx just this past week   08/21/2017  . Stroke [facility]) 2015   "drag left foot more since; have to wear glasses now" (12/17/2016)  . Type II diabetes mellitus (HCC)    takes Metformin daily   dx 2015  . Umbilical hernia   . Urinary frequency    . Wears dentures   . Wears glasses    and contact lenses    Past Surgical History:  Procedure Laterality Date  . ANEURYSM COILING  2015  . CARDIOVERSION N/A 12/18/2016   Procedure: CARDIOVERSION;  Surgeon: Orpah Cobb, MD;  Location: [facility] ENDOSCOPY;  Service: Cardiovascular;  Laterality: N/A;  . COLONOSCOPY    . EXCISIONAL HEMORRHOIDECTOMY  1980s   "soon after rectal OR"  . GANGLION CYST EXCISION Right   . IR 3D INDEPENDENT WKST  05/10/2017  . IR 3D INDEPENDENT WKST  06/28/2017  . IR ANGIO INTRA EXTRACRAN SEL COM CAROTID INNOMINATE BILAT MOD SED  04/22/2017  . IR ANGIO INTRA EXTRACRAN SEL COM CAROTID INNOMINATE BILAT MOD SED  11/22/2017  . IR ANGIO INTRA EXTRACRAN SEL INTERNAL CAROTID UNI R MOD SED  05/10/2017  . IR ANGIO INTRA EXTRACRAN SEL INTERNAL CAROTID UNI R MOD SED  06/28/2017  . IR ANGIO VERTEBRAL SEL SUBCLAVIAN INNOMINATE UNI L MOD SED  04/22/2017  . IR ANGIO VERTEBRAL SEL SUBCLAVIAN INNOMINATE UNI R MOD SED  06/28/2017  . IR ANGIO VERTEBRAL SEL SUBCLAVIAN INNOMINATE UNI R MOD SED  11/22/2017  . IR ANGIO VERTEBRAL SEL VERTEBRAL UNI R  MOD SED  04/22/2017  . IR ANGIOGRAM FOLLOW UP STUDY  05/10/2017  . IR ANGIOGRAM FOLLOW UP STUDY  06/28/2017  . IR NEURO EACH ADD'L AFTER BASIC UNI RIGHT (MS)  05/10/2017  . IR NEURO EACH ADD'L AFTER BASIC UNI RIGHT (MS)  06/28/2017  . IR RADIOLOGIST EVAL & MGMT  04/30/2017  . IR RADIOLOGIST EVAL & MGMT  06/15/2017  . IR RADIOLOGIST EVAL & MGMT  07/13/2017  . IR TRANSCATH/EMBOLIZ  05/10/2017  . IR TRANSCATH/EMBOLIZ  06/28/2017  . MULTIPLE TOOTH EXTRACTIONS    . RADIOLOGY WITH ANESTHESIA N/A 08/08/2014   Procedure: RADIOLOGY WITH ANESTHESIA EMBOLIZATION;  Surgeon: Medication Radiologist, MD;  Location: [facility] OR;  Service: Radiology;  Laterality: N/A;  . RADIOLOGY WITH ANESTHESIA N/A 11/21/2014   Procedure: RADIOLOGY WITH ANESTHESIA;  Surgeon: Oneal Grout, MD;  Location: [facility] OR;  Service: Radiology;  Laterality: N/A;  . RADIOLOGY WITH ANESTHESIA N/A 02/27/2015     Procedure: RADIOLOGY WITH ANESTHESIA;  Surgeon: [name], MD;  Location: [facility] OR;  Service: Radiology;  Laterality: N/A;  . RADIOLOGY WITH ANESTHESIA N/A 09/25/2015   Procedure: RADIOLOGY WITH ANESTHESIA;  Surgeon: [name], MD;  Location: [facility] OR;  Service: Radiology;  Laterality: N/A;  . RADIOLOGY WITH ANESTHESIA N/A 05/10/2017   Procedure: EMBOLIZATION;  Surgeon: [name], MD;  Location: [facility] OR;  Service: Radiology;  Laterality: N/A;  . RADIOLOGY WITH ANESTHESIA N/A 06/28/2017   Procedure: RADIOLOGY WITH ANESTHESIA-EMBOLIZATION;  Surgeon: [name], MD;  Location: [facility] OR;  Service: Radiology;  Laterality: N/A;  . RADIOLOGY WITH ANESTHESIA N/A 08/26/2017   Procedure: RADIOLOGY WITH ANESTHESIA EMBOLIZATION;  Surgeon: [name], MD;  Location: [facility] OR;  Service: Radiology;  Laterality: N/A;  . RADIOLOGY WITH ANESTHESIA N/A 11/22/2017   Procedure: EMBOLIZATION;  Surgeon: [name], MD;  Location: [facility] OR;  Service: Radiology;  Laterality: N/A;  . RECTAL SURGERY  1980s   "tore it lifting heavy furniture; sewed it back together"  . SHOULDER ARTHROSCOPY WITH OPEN ROTATOR CUFF REPAIR Right 2016  . SHOULDER ARTHROSCOPY WITH ROTATOR CUFF REPAIR Left 1996  . SHOULDER OPEN ROTATOR CUFF REPAIR Left 1997   "took out 8inches of my collarbone"  . TEE WITHOUT CARDIOVERSION N/A 12/18/2016   Procedure: TRANSESOPHAGEAL ECHOCARDIOGRAM (TEE);  Surgeon: Orpah Cobb, MD;  Location: [facility] ENDOSCOPY;  Service: Cardiovascular;  Laterality: N/A;  . TRACHEOSTOMY  1964   at age 80 months old; for asthma  . TRACHEOSTOMY CLOSURE      Allergies: Tylox [oxycodone-acetaminophen] and Adhesive [tape]  Medications: Prior to Admission medications   Medication Sig Start Date End Date Taking? Authorizing Provider  albuterol (PROVENTIL HFA;VENTOLIN HFA) 108 (90 BASE) MCG/ACT inhaler Inhale 2 puffs into the lungs every 6 (six) hours as needed for wheezing or shortness of breath.      [provider]  amiodarone (PACERONE) 200 MG tablet Take 1 tablet (200 mg total) by mouth daily. 12/20/16   Orpah Cobb, MD  aspirin EC 81 MG tablet Take 1 tablet (81 mg total) daily by mouth. 08/26/17   Barnetta Chapel, PA-C  clopidogrel (PLAVIX) 75 MG tablet Take 0.5 tablets (37.5 mg total) daily by mouth. Patient taking differently: Take 37.5 mg by mouth every other day. In the morning. 08/26/17   Barnetta Chapel, PA-C  esomeprazole (NEXIUM) 40 MG capsule Take 40 mg by mouth at bedtime as needed (for acid reflux).     [provider]  furosemide (LASIX) 40 MG tablet Take 40 mg by mouth daily.  [provider]  gabapentin (NEURONTIN) 100 MG capsule TAKE 1 CAPSULE BY MOUTH THREE TIMES DAILY 08/25/15   Marvel Plan, MD  hydrochlorothiazide (HYDRODIURIL) 25 MG tablet Take 0.5 tablets (12.5 mg total) by mouth daily. 12/19/16   Orpah Cobb, MD  metFORMIN (GLUCOPHAGE) 500 MG tablet Take 1 tablet (500 mg total) by mouth 2 (two) times daily with a meal. 07/06/14   Calvert Cantor, MD  nitroGLYCERIN (NITROSTAT) 0.4 MG SL tablet Place 1 tablet (0.4 mg total) under the tongue every 5 (five) minutes x 3 doses as needed for chest pain. 07/08/14   Orpah Cobb, MD  potassium chloride (K-DUR) 10 MEQ tablet Take 10 mEq by mouth daily.    [provider]  umeclidinium-vilanterol (ANORO ELLIPTA) 62.5-25 MCG/INH AEPB Inhale 1 puff into the lungs daily.     [provider]     Family History  Problem Relation Age of Onset  . Heart disease Mother        s/p 3V CABG  . Cancer Father 26       lung cancer; +tobacco  . Stroke Father   . Cancer Maternal Grandmother   . Heart disease Maternal Grandmother   . Cancer Paternal Grandmother   . Lupus Sister   . Multiple sclerosis Sister   . Anemia Daughter     Social History   Socioeconomic History  . Marital status: Divorced    Spouse name: not together since 2007  . Number of children: 3  . Years of education: 10    . Highest education level: None  Social Needs  . Financial resource strain: None  . Food insecurity - worry: None  . Food insecurity - inability: None  . Transportation needs - medical: None  . Transportation needs - non-medical: None  Occupational History  . 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 Smoking status: Former Smoker    Packs/day: 1.50    Years: 25.00    Pack years: 37.50    Types: Cigarettes    Start date: 01/15/1975    Last attempt to quit: 06/05/2014    Years since quitting: 3.5  . Smokeless tobacco: Never Used  Substance and Sexual Activity  . Alcohol use: Yes    Alcohol/week: 1.8 oz    Types: 3 Cans of beer per week    Comment: drinks 3 - 12oz bottles every monday, during shooting pool  . Drug use: No  . Sexual activity: No    Partners: Female    Comment: partner has had surgery to prevent pregnancy  Other Topics Concern  . None  Social History Narrative   Lives with his son and his mother.  His son has no contact with the son's mother, though the patient is not legally separated from her.  She has a history of drug use and has been in prison several times.    Review of Systems: A 12 point ROS discussed and pertinent positives are indicated in the HPI above.  All other systems are negative.  Review of Systems  Vital Signs: BP (!) 151/74   Pulse (!) 45   Temp 97.8 F (36.6 C) (Oral)   Resp 20   SpO2 98%   Physical Exam  Constitutional: He is oriented to person, place, and time. He appears well-developed and well-nourished. No distress.  HENT:  Head: Normocephalic.  Mouth/Throat: Oropharynx is clear and moist.  Neck: No JVD present.  Cardiovascular:  Normal rate, regular rhythm, normal heart sounds and intact distal pulses.  Pulmonary/Chest: Effort normal and breath sounds normal. No respiratory distress.  Abdominal: Soft. There is no tenderness.  Neurological: He is alert and oriented to person,  place, and time. No cranial nerve deficit. He exhibits normal muscle tone. Coordination normal.  Skin: Skin is warm and dry.  Psychiatric: He has a normal mood and affect.     Imaging: No results found.  Labs:  CBC: Recent Labs    08/23/17 0942 11/15/17 0820 12/16/17 1004 12/27/17 0620  WBC 6.6 6.1 6.1 6.5  HGB 13.6 14.0 13.9 12.1*  HCT 40.8 42.3 42.4 37.0*  PLT 224 239 248 229    COAGS: Recent Labs    08/23/17 0942 11/15/17 0820 12/16/17 1003 12/27/17 0620  INR 0.96 0.92 0.99 1.01  APTT 28 24 29 28     BMP: Recent Labs    10/27/17 0825 11/15/17 0820 12/16/17 1003 12/27/17 0620  NA 139 140 139 138  K 4.7 4.3 3.9 3.6  CL 103 104 102 104  CO2 27 24 27 24   GLUCOSE 114* 108* 97 107*  BUN 13 11 11 11   CALCIUM 9.2 9.3 9.2 8.9  CREATININE 0.95 0.88 0.95 0.92  GFRNONAA >60 >60 >60 >60  GFRAA >60 >60 >60 >60    LIVER FUNCTION TESTS: Recent Labs    07/13/17 1315 08/23/17 0942 10/27/17 0825 12/27/17 0620  BILITOT 0.4 0.8 0.8 0.6  AST 19 19 20 17   ALT 20 22 24 17   ALKPHOS 66 61 62 53  PROT 6.5 6.9 7.0 6.4*  ALBUMIN 3.8 4.0 4.0 3.7    TUMOR MARKERS: No results for input(s): AFPTM, CEA, CA199, CHROMGRNA in the last 8760 hours.  Assessment and Plan: (R)MCA aneurysm with filling For cerebral arteriogram and planned intervention/coiling Labs reviewed. Risks and benefits of cerebral angiogram with intervention were discussed with the patient including, but not limited to bleeding, infection, vascular injury, contrast induced renal failure, stroke or even death.  This interventional procedure involves the use of X-rays and because of the nature of the planned procedure, it is possible that we will have prolonged use of X-ray fluoroscopy.  Potential radiation risks to you include (but are not limited to) the following: - A slightly elevated risk for cancer  several years later in life. This risk is typically less than 0.5% percent. This risk is low in  comparison to the normal incidence of human cancer, which is 33% for women and 50% for men according to the American Cancer Society. - Radiation induced injury can include skin redness, resembling a rash, tissue breakdown / ulcers and hair loss (which can be temporary or permanent).   The likelihood of either of these occurring depends on the difficulty of the procedure and whether you are sensitive to radiation due to previous procedures, disease, or genetic conditions.   IF your procedure requires a prolonged use of radiation, you will be notified and given written instructions for further action.  It is your responsibility to monitor the irradiated area for the 2 weeks following the procedure and to notify your physician if you are concerned that you have suffered a radiation induced injury.    All of the patient's questions were answered, patient is agreeable to proceed.  Consent signed and in chart.   Thank you for this interesting consult.  I greatly enjoyed meeting [name] and look forward to participating in their care.  A copy of this report was  sent to the requesting provider on this date.  Electronically Signed: Brayton El, PA-C 12/27/2017, 8:13 AM

## 2017-12-27 NOTE — Progress Notes (Signed)
Rt set up cpap and placed on patient. Patient tolerating well at this time. RT will monitor patient as needed.

## 2017-12-27 NOTE — Progress Notes (Signed)
ANTICOAGULATION CONSULT NOTE - Initial Consult  Pharmacy Consult for heparin Indication: Post Interventional Neuroradiology Procedure  Allergies  Allergen Reactions  . Tylox [Oxycodone-Acetaminophen] Hives, Other (See Comments) and Rash    Sweating, shaking Reaction: Tremors and diaphoresis   . Adhesive [Tape] Rash    Patient Measurements: Height: 5' 7.99" (172.7 cm) Weight: 253 lb 8.5 oz (115 kg) IBW/kg (Calculated) : 68.38 Heparin Dosing Weight: 94.3 kg  Vital Signs: Temp: 98.1 F (36.7 C) (03/18 2000) Temp Source: Oral (03/18 2000) BP: 120/105 (03/18 2100) Pulse Rate: 65 (03/18 2100)  Labs: Recent Labs    12/27/17 0620 12/27/17 2000  HGB 12.1*  --   HCT 37.0*  --   PLT 229  --   APTT 28  --   LABPROT 13.2  --   INR 1.01  --   HEPARINUNFRC  --  <0.10*  CREATININE 0.92  --     Estimated Creatinine Clearance: 111.6 mL/min (by C-G formula based on SCr of 0.92 mg/dL).   Medical History: Past Medical History:  Diagnosis Date  . A-fib (HCC)   . Aneurysm (HCC)    brain  . Arthritis   . Atrial fibrillation (HCC)   . Bipolar disorder (HCC)    was on meds but was taken off 2 yrs ago and none since  . Burning pain    in both legs-seeing Dr.Sethi for this  . CAD (coronary artery disease)   . Cataracts, bilateral   . Childhood asthma    Last asthma attack at age 57; History of trach at 16 months (12/17/2016)  . Chronic kidney disease 2009  . Chronic lower back pain   . COPD (chronic obstructive pulmonary disease) (HCC) 06/2012   uses Spiriva and Albuterol daily as needed  . Depression   . Family history of adverse reaction to anesthesia    "Mom and sister have PONV"  . GERD (gastroesophageal reflux disease)   . H/O hiatal hernia   . Headache(784.0)    "q 2-3 weeks; daily last 2 wks" (12/17/2016)  . Hyperlipidemia    takes Ramipril daily  . Hypertension    takes Ramipril daily  . Joint pain   . Joint swelling   . Middle cerebral artery aneurysm    right  .  Nocturia   . Numbness    both arms   . Pneumonia    hx of-2014  . PONV (postoperative nausea and vomiting)    Pt reports nausea only.  . Sleep apnea    dx just this past week   08/21/2017  . Stroke [facility]) 2015   "drag left foot more since; have to wear glasses now" (12/17/2016)  . Type II diabetes mellitus (HCC)    takes Metformin daily   dx 2015  . Umbilical hernia   . Urinary frequency   . Wears dentures   . Wears glasses    and contact lenses    Medications:  Scheduled:  . [START ON 12/28/2017] aspirin  81 mg Oral Daily   Or  . [START ON 12/28/2017] aspirin  81 mg Per Tube Daily  . clopidogrel  75 mg Oral Daily   Or  . clopidogrel  75 mg Per Tube Daily    Assessment: 56 yom who underwent Lt common carotid arteriogram followed by endovascular treatment. No anticoag PTA - on aspirin and plavix. Hgb is 12.1, plts WNL. No signs/symptoms of bleeding. Initial heparin level is undetectable, on 500 units/hr. No infusion issues per nursing.  Goal of  Therapy:  Heparin level 0.1-0.25 units/ml Monitor platelets by anticoagulation protocol: Yes   Plan:  Increase heparin infusion to 750 units/hr Will check heparin level and CBC with morning labs Monitor signs/symptoms of bleeding Heparin to be discontinued at 0700  Girard CooterKimberly Perkins, PharmD Clinical Pharmacist  Pager: [phone] Phone: [phone] 12/27/2017,10:01 PM

## 2017-12-27 NOTE — Anesthesia Procedure Notes (Signed)
Arterial Line Insertion Start/End3/18/2019 8:00 AM Performed by: Dorie RankQuinn, Holly M, CRNA, CRNA  Patient location: Pre-op. Preanesthetic checklist: patient identified, IV checked, site marked, risks and benefits discussed, surgical consent, monitors and equipment checked and pre-op evaluation Lidocaine 1% used for infiltration radial was placed Catheter size: 20 G Hand hygiene performed , maximum sterile barriers used  and Seldinger technique used Allen's test indicative of satisfactory collateral circulation Attempts: 2 (first try failed by  [facility], CRNA. successed on second try.) Procedure performed without using ultrasound guided technique. Following insertion, dressing applied and Biopatch. Post procedure assessment: normal  Patient tolerated the procedure well with no immediate complications.

## 2017-12-27 NOTE — Progress Notes (Signed)
Patient seen with IR tech with concerns of right groin incision bleeding.  Denies H/A, N, V. BP WNL and stable. Distal pulses 2+ bilaterally. ACT 147 at approximately 14:00.  Right groin incision soft, c/d/i with minimal bleeding and without hematoma. Pressure was held for 25 minutes until bleeding subsided. New dressing applied to wound.   Baird LyonsAlexandra M Louk, MS PA-C 12/27/2017, 2:10 PM

## 2017-12-27 NOTE — H&P (Deleted)
  The note originally documented on this encounter has been moved the the encounter in which it belongs.  

## 2017-12-27 NOTE — Progress Notes (Signed)
Patient ID: [name], male   DOB: 11/26/1960, 57 y.o.   MRN: [id number] INR. Post procedure patient extubated without difficulty.Maintaining O2 sats. Denoies any H/As,N/V visual or motor symptoms. VS . BoP 140s/70s.HR 80s SR.PAO2 > 95 % on 2L O2  Pupils 3mm Rt = Lt Sluggish. No facial droop. Tongue midline. Motor. 5/5 all 4s prox and distally. RT groin soft  No hematoma or bleeding. Pulses 2+ both DPs and PTs . S.Shakala Marlatt MD

## 2017-12-27 NOTE — Anesthesia Postprocedure Evaluation (Signed)
Anesthesia Post Note  Patient: Mariea ClontsRichard L Chay  Procedure(s) Performed: EMBOLIZATION (N/A )     Patient location during evaluation: PACU Anesthesia Type: General Level of consciousness: awake and alert Pain management: pain level controlled Vital Signs Assessment: post-procedure vital signs reviewed and stable Respiratory status: spontaneous breathing, nonlabored ventilation, respiratory function stable and patient connected to nasal cannula oxygen Cardiovascular status: blood pressure returned to baseline and stable Postop Assessment: no apparent nausea or vomiting Anesthetic complications: no    Last Vitals:  Vitals:   12/27/17 1237 12/27/17 1300  BP:  133/67  Pulse:  (!) 55  Resp:  19  Temp: 36.6 C 36.6 C  SpO2:  94%    Last Pain:  Vitals:   12/27/17 1300  TempSrc: Oral  PainSc:                  Trevor IhaStephen A Houser

## 2017-12-27 NOTE — Sedation Documentation (Signed)
angioseal 796fr to R groin

## 2017-12-27 NOTE — Progress Notes (Signed)
Spoke to Lapelkevin PA for Dr. Corliss Skainseveshwar, stated not to give aspirin,plavix,or nimodipine until I here back from them.

## 2017-12-27 NOTE — Sedation Documentation (Signed)
Bedside report given to PACU nurses, groin site remains soft dressing CDI, pedal pulses unchanged

## 2017-12-27 NOTE — Procedures (Signed)
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

## 2017-12-28 ENCOUNTER — Other Ambulatory Visit: Payer: Self-pay

## 2017-12-28 ENCOUNTER — Encounter (HOSPITAL_COMMUNITY): Payer: Self-pay | Admitting: Interventional Radiology

## 2017-12-28 ENCOUNTER — Other Ambulatory Visit (HOSPITAL_COMMUNITY): Payer: Self-pay | Admitting: Interventional Radiology

## 2017-12-28 DIAGNOSIS — I729 Aneurysm of unspecified site: Secondary | ICD-10-CM

## 2017-12-28 LAB — CBC WITH DIFFERENTIAL/PLATELET
Basophils Absolute: 0 10*3/uL (ref 0.0–0.1)
Basophils Relative: 0 %
EOS PCT: 1 %
Eosinophils Absolute: 0.1 10*3/uL (ref 0.0–0.7)
HCT: 34.3 % — ABNORMAL LOW (ref 39.0–52.0)
Hemoglobin: 11.2 g/dL — ABNORMAL LOW (ref 13.0–17.0)
LYMPHS ABS: 1.6 10*3/uL (ref 0.7–4.0)
LYMPHS PCT: 30 %
MCH: 29.5 pg (ref 26.0–34.0)
MCHC: 32.7 g/dL (ref 30.0–36.0)
MCV: 90.3 fL (ref 78.0–100.0)
MONO ABS: 0.3 10*3/uL (ref 0.1–1.0)
MONOS PCT: 6 %
Neutro Abs: 3.3 10*3/uL (ref 1.7–7.7)
Neutrophils Relative %: 63 %
PLATELETS: 193 10*3/uL (ref 150–400)
RBC: 3.8 MIL/uL — ABNORMAL LOW (ref 4.22–5.81)
RDW: 13.3 % (ref 11.5–15.5)
WBC: 5.3 10*3/uL (ref 4.0–10.5)

## 2017-12-28 LAB — BASIC METABOLIC PANEL
Anion gap: 9 (ref 5–15)
BUN: 8 mg/dL (ref 6–20)
CHLORIDE: 105 mmol/L (ref 101–111)
CO2: 24 mmol/L (ref 22–32)
Calcium: 8.1 mg/dL — ABNORMAL LOW (ref 8.9–10.3)
Creatinine, Ser: 0.79 mg/dL (ref 0.61–1.24)
GFR calc Af Amer: >60 mL/min (ref >60)
GFR calc non Af Amer: >60 mL/min (ref >60)
GLUCOSE: 115 mg/dL — AB (ref 65–99)
POTASSIUM: 3.7 mmol/L (ref 3.5–5.1)
Sodium: 138 mmol/L (ref 135–145)

## 2017-12-28 MED ORDER — CLOPIDOGREL BISULFATE 75 MG PO TABS: 75.0000 mg | ORAL_TABLET | Freq: Every day | ORAL | Status: DC

## 2017-12-28 NOTE — Discharge Instructions (Signed)
Endovascular Therapy for Cerebral Aneurysm, Care After °This sheet gives you information about how to care for yourself after your procedure. Your health care provider may also give you more specific instructions. If you have problems or questions, contact your health care provider. °What can I expect after the procedure? °After the procedure, it is common to have: °· Pain, tenderness, and swelling around your incision. °· Headaches. ° °Follow these instructions at home: °Medicines °· Take over-the-counter and prescription medicines only as told by your health care provider. °· If you were prescribed medicines to prevent blood clots (antiplatelet medicines), talk with your health care provider about the risks. These medicines can increase bleeding, so you may need to avoid certain activities. °Incision care °· Follow instructions from your health care provider about how to take care of your incision. Make sure you: °? Wash your hands with soap and water before you change your bandage (dressing). If soap and water are not available, use hand sanitizer. °? Change your dressing as told by your health care provider. °? Leave stitches (sutures), skin glue, or adhesive strips in place. These skin closures may need to stay in place for 2 weeks or longer. If adhesive strip edges start to loosen and curl up, you may trim the loose edges. Do not remove adhesive strips completely unless your health care provider tells you to do that. °· Check your incision area every day for signs of infection. Check for: °? Redness, swelling, or pain. °? Fluid or blood. °? Warmth. °? Pus or a bad smell. °Activity °· Ask your health care provider what activities are safe for you during recovery. Most people can return to normal activities 2-6 weeks after the procedure. °· Do not drive until your health care provider approves. °· Do not drive or use heavy machinery while taking prescription pain medicine. °· Do not lift anything that is heavier  than 10 lb (4.5 kg), or the limit that you are told, until your health care provider says that it is safe. °· Exercise regularly, as directed by your health care provider. °· Attend rehabilitation therapy as told by your health care provider. This may include: °? Physical and occupational therapy. °? Speech-language therapy. °? Brain exercises. °? Balance exercises. °? Individual or group therapy. °? Education about your condition and treatment. °Eating and drinking °· Drink enough fluid to keep your urine pale yellow. °· Eat a healthy diet. This includes plenty of fruits and vegetables, whole grains, low-fat dairy products, and lean protein. °General instructions °· Do not use any products that contain nicotine or tobacco, such as cigarettes and e-cigarettes. If you need help quitting, ask your health care provider. °· Do not take baths, swim, or use a hot tub until your health care provider approves. Ask your health care provider if you may take showers. You may only be allowed to take sponge baths for bathing. °· Manage your stress. If you need help with this, talk with your health care provider. °· Wear compression stockings as told by your health care provider. These stockings help to prevent blood clots and reduce swelling in your legs. °· Keep all follow-up visits as told by your health care provider. This is important. °Contact a health care provider if: °· You have redness, swelling, or pain around your incision. °· You have fluid or blood coming from your incision. °· Your incision feels warm to the touch. °· You have pus or a bad smell coming from your incision. °· You have   a fever. °Get help right away if: °· You have: °? Stiffness in your neck. °? Pain, numbness, weakness, or swelling in your legs. °? Severe chest pain. °? Difficulty breathing. °? Confusion. °· You have any symptoms of stroke. "BE FAST" is an easy way to remember the main warning signs of stroke: °? B - Balance. Signs are dizziness,  sudden trouble walking, or loss of balance. °? E - Eyes. Signs are trouble seeing or a sudden change in vision. °? F - Face. Signs are sudden weakness or numbness of the face, or the face or eyelid drooping on one side. °? A - Arms. Signs are weakness or numbness in an arm. This happens suddenly and usually on one side of the body. °? S - Speech. Signs are sudden trouble speaking, slurred speech, or trouble understanding what people say. °? T - Time. Time to call emergency services. Write down what time symptoms started. °· You have other signs of stroke, such as: °? A sudden, severe headache that does not get better with medicine. °? Sudden nausea or vomiting. °? A seizure. °These symptoms may represent a serious problem that is an emergency. Do not wait to see if the symptoms will go away. Get medical help right away. Call your local emergency services (911 in the [place] Do not drive yourself to the hospital. °Summary °· After this procedure, it is common to have some pain and swelling around your incision. °· Follow instructions from your health care provider about how to take care of your incision. Check for signs of infection every day. °· Most people can return to normal activities 2-6 weeks after the procedure. Ask your health care provider what activities are safe for you during recovery. °· Do not drive until your health care provider approves. Do not drive while taking prescription pain medicine. °· Do not use any products that contain nicotine or tobacco, such as cigarettes and e-cigarettes. If you need help quitting, ask your health care provider. °This information is not intended to replace advice given to you by your health care provider. Make sure you discuss any questions you have with your health care provider. °Document Released: 01/04/2017 Document Revised: 01/04/2017 Document Reviewed: 01/04/2017 °Elsevier Interactive Patient Education © 2018 [employer] ° °

## 2017-12-28 NOTE — Discharge Summary (Signed)
Patient ID: [name] MRN: [id number] DOB/AGE: February 06, 1961 57 y.o.  Admit date: 12/27/2017 Discharge date: 12/28/2017  Supervising Physician: [name]  Patient Status: [facility] - In-pt  Admission Diagnoses: R MCA aneurysm  Discharge Diagnoses:  Active Problems:   Brain aneurysm  Discharged Condition: Stable  Hospital Course: Known to [facility], consulted with Dr. [name] for hx cerebral aneurysms. 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 12/27/2017: Scheduled for cerebral angiogram with intervention, L MCA angioplasty/stenting performed.  Post-op day 0 (12/27/2017): Neurological function intact, A&Ox3, distal pulses 2+ bilaterally. Denies H/A, N/V, or visual/motor/sensory symptoms. Right groin incision c/d/i without hematoma or active bleeding. Post-op day 1 (12/28/2017): Patient sitting in bed feeding self. Neurologically intact, distal pulses 2+ bilaterally. Complains of 4/10 H/A that improves with Tylenol. Denies visual/motor/sensory symptoms. Plan for discharge to home.  Consults: None  Significant Diagnostic Studies: Cerebral arteriogram  Treatments: S/P L MCA angioplasty/stenting with #1, 3.62mm x 14mm pipeline flex flow diverter device. Continue ASA 81mg  daily.  Continue Plavix 75mg  daily.  Discharge Exam: Blood pressure 102/62, pulse (!) 54, temperature 97.6 F (36.4 C), temperature source Oral, resp. rate 20, height 5' 7.99" (1.727 m), weight 253 lb 8.5 oz (115 kg), SpO2 95 %. General appearance: alert and no distress Resp: clear to auscultation bilaterally Cardio: regular rate and rhythm, S1, S2 normal, no murmur, click, rub or gallop Pulses: 2+ and symmetric Neurologic: Alert and oriented X 3, normal strength and tone. Normal symmetric reflexes. Normal coordination and gait Incision/Wound:Right groin wound c/d/i without hematoma or active bleeding.  Disposition: Discharge disposition: 01-Home or Self Care      Discharge  Instructions    Call MD for:  difficulty breathing, headache or visual disturbances   Complete by:  As directed    Call MD for:  extreme fatigue   Complete by:  As directed    Call MD for:  hives   Complete by:  As directed    Call MD for:  persistant dizziness or light-headedness   Complete by:  As directed    Call MD for:  persistant nausea and vomiting   Complete by:  As directed    Call MD for:  redness, tenderness, or signs of infection (pain, swelling, redness, odor or green/yellow discharge around incision site)   Complete by:  As directed    Call MD for:  severe uncontrolled pain   Complete by:  As directed    Call MD for:  temperature >100.4   Complete by:  As directed    Diet - low sodium heart healthy   Complete by:  As directed    Driving Restrictions   Complete by:  As directed    No driving for 48 hours.   Increase activity slowly   Complete by:  As directed    Lifting restrictions   Complete by:  As directed    No heavy lifting (over 15-20 lbs) for 3-4 days.   May shower / Bathe   Complete by:  As directed    May walk up steps   Complete by:  As directed      Allergies as of 12/28/2017      Reactions   Tylox [oxycodone-acetaminophen] Hives, Other (See Comments), Rash   Sweating, shaking Reaction: Tremors and diaphoresis    Adhesive [tape] Rash      Medication List    STOP taking these medications   hydrochlorothiazide 25 MG tablet Commonly known as:  HYDRODIURIL     TAKE  these medications   albuterol 108 (90 Base) MCG/ACT inhaler Commonly known as:  PROVENTIL HFA;VENTOLIN HFA Inhale 2 puffs into the lungs every 6 (six) hours as needed for wheezing or shortness of breath.   amiodarone 200 MG tablet Commonly known as:  PACERONE Take 1 tablet (200 mg total) by mouth daily.   ANORO ELLIPTA 62.5-25 MCG/INH Aepb Generic drug:  umeclidinium-vilanterol Inhale 1 puff into the lungs daily.   aspirin EC 81 MG tablet Take 1 tablet (81 mg total) daily by  mouth.   clopidogrel 75 MG tablet Commonly known as:  PLAVIX Take 1 tablet (75 mg total) by mouth daily. What changed:  how much to take   esomeprazole 40 MG capsule Commonly known as:  NEXIUM Take 40 mg by mouth at bedtime as needed (for acid reflux).   furosemide 40 MG tablet Commonly known as:  LASIX Take 40 mg by mouth daily.   gabapentin 100 MG capsule Commonly known as:  NEURONTIN TAKE 1 CAPSULE BY MOUTH THREE TIMES DAILY   metFORMIN 500 MG tablet Commonly known as:  GLUCOPHAGE Take 1 tablet (500 mg total) by mouth 2 (two) times daily with a meal.   nitroGLYCERIN 0.4 MG SL tablet Commonly known as:  NITROSTAT Place 1 tablet (0.4 mg total) under the tongue every 5 (five) minutes x 3 doses as needed for chest pain.   potassium chloride 10 MEQ tablet Commonly known as:  K-DUR Take 10 mEq by mouth daily.      Follow-up Information    Julieanne Cottoneveshwar, Sanjeev, MD Follow up in 2 week(s).   Specialties:  Interventional Radiology, Radiology Why:  Victorino DikeJennifer will call with appointment in 2 weeks. Call [phone] with any questions. Contact information: [address] [place] [address] [phone]            Electronically Signed: Elwin MochaAlexandra Heather Streeper, PA-C 12/28/2017, 9:48 AM   I have spent 30 minutes discharging [name].

## 2017-12-29 ENCOUNTER — Encounter (HOSPITAL_COMMUNITY): Payer: Self-pay | Admitting: Interventional Radiology

## 2018-01-11 ENCOUNTER — Ambulatory Visit (HOSPITAL_COMMUNITY)
Admission: RE | Admit: 2018-01-11 | Discharge: 2018-01-11 | Disposition: A | Discharge status: Home / Self Care | Payer: Medicare Other | Source: Ambulatory Visit | Attending: Interventional Radiology | Admitting: Interventional Radiology

## 2018-01-11 DIAGNOSIS — I729 Aneurysm of unspecified site: Secondary | ICD-10-CM

## 2018-01-11 HISTORY — PX: IR RADIOLOGIST EVAL & MGMT: IMG5224

## 2018-01-14 ENCOUNTER — Encounter (HOSPITAL_COMMUNITY): Payer: Self-pay | Admitting: Interventional Radiology

## 2018-03-15 ENCOUNTER — Ambulatory Visit: Payer: Medicare Other | Admitting: Neurology

## 2018-03-29 ENCOUNTER — Other Ambulatory Visit: Payer: Self-pay | Admitting: Gastroenterology

## 2018-04-08 ENCOUNTER — Encounter (HOSPITAL_COMMUNITY): Payer: Self-pay | Admitting: *Deleted

## 2018-04-08 ENCOUNTER — Encounter (HOSPITAL_COMMUNITY)
Admission: RE | Disposition: A | Discharge status: Home / Self Care | Payer: Self-pay | Source: Ambulatory Visit | Attending: Gastroenterology

## 2018-04-08 ENCOUNTER — Other Ambulatory Visit: Payer: Self-pay

## 2018-04-08 ENCOUNTER — Ambulatory Visit (HOSPITAL_COMMUNITY)
Admission: RE | Admit: 2018-04-08 | Discharge: 2018-04-08 | Disposition: A | Discharge status: Home / Self Care | Payer: Medicare Other | Source: Ambulatory Visit | Attending: Gastroenterology | Admitting: Gastroenterology

## 2018-04-08 DIAGNOSIS — Z7902 Long term (current) use of antithrombotics/antiplatelets: Secondary | ICD-10-CM | POA: Diagnosis not present

## 2018-04-08 DIAGNOSIS — J449 Chronic obstructive pulmonary disease, unspecified: Secondary | ICD-10-CM | POA: Diagnosis not present

## 2018-04-08 DIAGNOSIS — K219 Gastro-esophageal reflux disease without esophagitis: Secondary | ICD-10-CM | POA: Diagnosis not present

## 2018-04-08 DIAGNOSIS — E785 Hyperlipidemia, unspecified: Secondary | ICD-10-CM | POA: Diagnosis not present

## 2018-04-08 DIAGNOSIS — F319 Bipolar disorder, unspecified: Secondary | ICD-10-CM | POA: Diagnosis not present

## 2018-04-08 DIAGNOSIS — I129 Hypertensive chronic kidney disease with stage 1 through stage 4 chronic kidney disease, or unspecified chronic kidney disease: Secondary | ICD-10-CM | POA: Diagnosis not present

## 2018-04-08 DIAGNOSIS — K635 Polyp of colon: Secondary | ICD-10-CM | POA: Diagnosis not present

## 2018-04-08 DIAGNOSIS — G4730 Sleep apnea, unspecified: Secondary | ICD-10-CM | POA: Diagnosis not present

## 2018-04-08 DIAGNOSIS — N189 Chronic kidney disease, unspecified: Secondary | ICD-10-CM | POA: Diagnosis not present

## 2018-04-08 DIAGNOSIS — Z888 Allergy status to other drugs, medicaments and biological substances status: Secondary | ICD-10-CM | POA: Diagnosis not present

## 2018-04-08 DIAGNOSIS — I4891 Unspecified atrial fibrillation: Secondary | ICD-10-CM | POA: Diagnosis not present

## 2018-04-08 DIAGNOSIS — K5520 Angiodysplasia of colon without hemorrhage: Secondary | ICD-10-CM | POA: Insufficient documentation

## 2018-04-08 DIAGNOSIS — K921 Melena: Secondary | ICD-10-CM | POA: Diagnosis present

## 2018-04-08 DIAGNOSIS — Z87891 Personal history of nicotine dependence: Secondary | ICD-10-CM | POA: Insufficient documentation

## 2018-04-08 DIAGNOSIS — I69398 Other sequelae of cerebral infarction: Secondary | ICD-10-CM | POA: Diagnosis not present

## 2018-04-08 DIAGNOSIS — R12 Heartburn: Secondary | ICD-10-CM | POA: Insufficient documentation

## 2018-04-08 DIAGNOSIS — Z8249 Family history of ischemic heart disease and other diseases of the circulatory system: Secondary | ICD-10-CM | POA: Insufficient documentation

## 2018-04-08 DIAGNOSIS — Z7901 Long term (current) use of anticoagulants: Secondary | ICD-10-CM | POA: Diagnosis not present

## 2018-04-08 DIAGNOSIS — I671 Cerebral aneurysm, nonruptured: Secondary | ICD-10-CM | POA: Diagnosis not present

## 2018-04-08 DIAGNOSIS — E1122 Type 2 diabetes mellitus with diabetic chronic kidney disease: Secondary | ICD-10-CM | POA: Insufficient documentation

## 2018-04-08 DIAGNOSIS — I2510 Atherosclerotic heart disease of native coronary artery without angina pectoris: Secondary | ICD-10-CM | POA: Diagnosis not present

## 2018-04-08 DIAGNOSIS — M21372 Foot drop, left foot: Secondary | ICD-10-CM | POA: Diagnosis not present

## 2018-04-08 HISTORY — PX: COLONOSCOPY: SHX5424

## 2018-04-08 HISTORY — PX: HOT HEMOSTASIS: SHX5433

## 2018-04-08 HISTORY — PX: POLYPECTOMY: SHX5525

## 2018-04-08 HISTORY — PX: ESOPHAGOGASTRODUODENOSCOPY: SHX5428

## 2018-04-08 LAB — GLUCOSE, CAPILLARY: GLUCOSE-CAPILLARY: 92 mg/dL (ref 70–99)

## 2018-04-08 SURGERY — EGD (ESOPHAGOGASTRODUODENOSCOPY)
Anesthesia: Moderate Sedation

## 2018-04-08 MED ORDER — SPOT INK MARKER SYRINGE KIT
PACK | SUBMUCOSAL | Status: AC
  Filled 2018-04-08: qty 5

## 2018-04-08 MED ORDER — FENTANYL CITRATE (PF) 100 MCG/2ML IJ SOLN
INTRAMUSCULAR | Status: AC
  Filled 2018-04-08: qty 4

## 2018-04-08 MED ORDER — SODIUM CHLORIDE 0.9 % IV SOLN
INTRAVENOUS | Status: DC
  Administered 2018-04-08: 07:00:00 via INTRAVENOUS

## 2018-04-08 MED ORDER — FENTANYL CITRATE (PF) 100 MCG/2ML IJ SOLN
INTRAMUSCULAR | Status: DC | PRN
  Administered 2018-04-08 (×3): 25 ug via INTRAVENOUS

## 2018-04-08 MED ORDER — BUTAMBEN-TETRACAINE-BENZOCAINE 2-2-14 % EX AERO
INHALATION_SPRAY | CUTANEOUS | Status: DC | PRN
  Administered 2018-04-08: 2 via TOPICAL

## 2018-04-08 MED ORDER — MIDAZOLAM HCL 5 MG/ML IJ SOLN
INTRAMUSCULAR | Status: AC
  Filled 2018-04-08: qty 3

## 2018-04-08 MED ORDER — DIPHENHYDRAMINE HCL 50 MG/ML IJ SOLN
INTRAMUSCULAR | Status: AC
  Filled 2018-04-08: qty 1

## 2018-04-08 MED ORDER — EPINEPHRINE PF 1 MG/10ML IJ SOSY
PREFILLED_SYRINGE | INTRAMUSCULAR | Status: AC
  Filled 2018-04-08: qty 10

## 2018-04-08 MED ORDER — MIDAZOLAM HCL 5 MG/5ML IJ SOLN
INTRAMUSCULAR | Status: DC | PRN
  Administered 2018-04-08: 1 mg via INTRAVENOUS
  Administered 2018-04-08 (×2): 2 mg via INTRAVENOUS

## 2018-04-08 MED ORDER — DIPHENHYDRAMINE HCL 50 MG/ML IJ SOLN
INTRAMUSCULAR | Status: DC | PRN
  Administered 2018-04-08: 25 mg via INTRAVENOUS

## 2018-04-08 NOTE — Op Note (Signed)
[facility] Patient Name: [name] Procedure Date: 04/08/2018 MRN: [id number] Attending MD: Jeani Hawking , MD Date of Birth: Nov 22, 1960 CSN: [id number] Age: 57 Admit Type: Outpatient Procedure:                Upper GI endoscopy Indications:              Heartburn Providers:                Jeani Hawking, MD, Tomma Rakers, RN, Beryle Beams, Technician Referring MD:              Medicines:                See the colonoscopy report. Complications:            No immediate complications. Estimated Blood Loss:     Estimated blood loss: none. Procedure:                Pre-Anesthesia Assessment:                           - Prior to the procedure, a History and Physical                            was performed, and patient medications and                            allergies were reviewed. The patient's tolerance of                            previous anesthesia was also reviewed. The risks                            and benefits of the procedure and the sedation                            options and risks were discussed with the patient.                            All questions were answered, and informed consent                            was obtained. Prior Anticoagulants: The patient has                            taken no previous anticoagulant or antiplatelet                            agents. ASA Grade Assessment: III - A patient with                            severe systemic disease. After reviewing the risks                            and  benefits, the patient was deemed in                            satisfactory condition to undergo the procedure.                           - Sedation was administered by an endoscopy nurse.                            The sedation level attained was moderate.                           After obtaining informed consent, the endoscope was                            passed under direct vision. Throughout  the                            procedure, the patient's blood pressure, pulse, and                            oxygen saturations were monitored continuously. The                            [id number]( [id number] ) was introduced through the                            mouth, and advanced to the second part of duodenum.                            The upper GI endoscopy was accomplished without                            difficulty. The patient tolerated the procedure                            well. Scope In: Scope Out: Findings:      The esophagus was normal.      The stomach was normal.      The examined duodenum was normal. Impression:               - Normal esophagus.                           - Normal stomach.                           - Normal examined duodenum.                           - No specimens collected. Moderate Sedation:      Moderate (conscious) sedation was administered by the endoscopy nurse       and supervised by the endoscopist. The following parameters were       monitored: oxygen saturation, heart rate, blood pressure, and response       to care.  Recommendation:           - Patient has a contact number available for                            emergencies. The signs and symptoms of potential                            delayed complications were discussed with the                            patient. Return to normal activities tomorrow.                            Written discharge instructions were provided to the                            patient.                           - Resume previous diet.                           - Continue present medications. Procedure Code(s):        --- Professional ---                           [phone], Esophagogastroduodenoscopy, flexible,                            transoral; diagnostic, including collection of                            specimen(s) by brushing or washing, when performed                            (separate  procedure) Diagnosis Code(s):        --- Professional ---                           R12, Heartburn CPT copyright 2017 American Medical Association. All rights reserved. The codes documented in this report are preliminary and upon coder review may  be revised to meet current compliance requirements. Jeani Hawking, MD Jeani Hawking, MD 04/08/2018 8:46:07 AM This report has been signed electronically. Number of Addenda: 0

## 2018-04-08 NOTE — Discharge Instructions (Signed)

## 2018-04-08 NOTE — H&P (Signed)
[name] HPI: His last cerberal aneurysm embolization was 01/11/2018 and he was to remain on Eliquis and Plavix. Last week he had abdominal pain that was not relieved with a large bowel movement. His pain has essentially resolved, but it is concerning for him. He has a history of GERD for the past 5-6 years and it is well-controlled with esomeprazole. He denies having a prior EGD. He still has intermittent hematochezia that is associated with a bowel movement.   Past Medical History:  Diagnosis Date  . A-fib (HCC)   . Aneurysm (HCC)    brain  . Arthritis   . Atrial fibrillation (HCC)   . Bipolar disorder (HCC)    was on meds but was taken off 2 yrs ago and none since  . Burning pain    in both legs-seeing Dr.Sethi for this  . CAD (coronary artery disease)   . Cataracts, bilateral   . Childhood asthma    Last asthma attack at age 48; History of trach at 16 months (12/17/2016)  . Chronic kidney disease 2009  . Chronic lower back pain   . COPD (chronic obstructive pulmonary disease) (HCC) 06/2012   uses Spiriva and Albuterol daily as needed  . Depression   . Family history of adverse reaction to anesthesia    "Mom and sister have PONV"  . GERD (gastroesophageal reflux disease)   . H/O hiatal hernia   . Headache(784.0)    "q 2-3 weeks; daily last 2 wks" (12/17/2016)  . Hyperlipidemia    takes Ramipril daily  . Hypertension    takes Ramipril daily  . Joint pain   . Joint swelling   . Middle cerebral artery aneurysm    right  . Nocturia   . Numbness    both arms   . Pneumonia    hx of-2014  . PONV (postoperative nausea and vomiting)    Pt reports nausea only.  . Sleep apnea    dx just this past week   08/21/2017  . Stroke [facility]) 2015   "drag left foot more since; have to wear glasses now" (12/17/2016)  . Type II diabetes mellitus (HCC)    takes Metformin daily   dx 2015  . Umbilical hernia   . Urinary frequency   . Wears dentures   . Wears glasses    and contact  lenses    Past Surgical History:  Procedure Laterality Date  . ANEURYSM COILING  2015  . CARDIOVERSION N/A 12/18/2016   Procedure: CARDIOVERSION;  Surgeon: Orpah Cobb, MD;  Location: [facility] ENDOSCOPY;  Service: Cardiovascular;  Laterality: N/A;  . COLONOSCOPY    . EXCISIONAL HEMORRHOIDECTOMY  1980s   "soon after rectal OR"  . GANGLION CYST EXCISION Right   . IR 3D INDEPENDENT WKST  05/10/2017  . IR 3D INDEPENDENT WKST  06/28/2017  . IR ANGIO INTRA EXTRACRAN SEL COM CAROTID INNOMINATE BILAT MOD SED  04/22/2017  . IR ANGIO INTRA EXTRACRAN SEL COM CAROTID INNOMINATE BILAT MOD SED  11/22/2017  . IR ANGIO INTRA EXTRACRAN SEL INTERNAL CAROTID UNI L MOD SED  12/27/2017  . IR ANGIO INTRA EXTRACRAN SEL INTERNAL CAROTID UNI R MOD SED  05/10/2017  . IR ANGIO INTRA EXTRACRAN SEL INTERNAL CAROTID UNI R MOD SED  06/28/2017  . IR ANGIO VERTEBRAL SEL SUBCLAVIAN INNOMINATE UNI L MOD SED  04/22/2017  . IR ANGIO VERTEBRAL SEL SUBCLAVIAN INNOMINATE UNI R MOD SED  06/28/2017  . IR ANGIO VERTEBRAL SEL SUBCLAVIAN INNOMINATE UNI R MOD  SED  11/22/2017  . IR ANGIO VERTEBRAL SEL VERTEBRAL UNI R MOD SED  04/22/2017  . IR ANGIOGRAM FOLLOW UP STUDY  05/10/2017  . IR ANGIOGRAM FOLLOW UP STUDY  06/28/2017  . IR ANGIOGRAM FOLLOW UP STUDY  12/27/2017  . IR NEURO EACH ADD'L AFTER BASIC UNI LEFT (MS)  12/27/2017  . IR NEURO EACH ADD'L AFTER BASIC UNI RIGHT (MS)  05/10/2017  . IR NEURO EACH ADD'L AFTER BASIC UNI RIGHT (MS)  06/28/2017  . IR RADIOLOGIST EVAL & MGMT  04/30/2017  . IR RADIOLOGIST EVAL & MGMT  06/15/2017  . IR RADIOLOGIST EVAL & MGMT  07/13/2017  . IR RADIOLOGIST EVAL & MGMT  01/11/2018  . IR TRANSCATH/EMBOLIZ  05/10/2017  . IR TRANSCATH/EMBOLIZ  06/28/2017  . IR TRANSCATH/EMBOLIZ  12/27/2017  . MULTIPLE TOOTH EXTRACTIONS    . RADIOLOGY WITH ANESTHESIA N/A 08/08/2014   Procedure: RADIOLOGY WITH ANESTHESIA EMBOLIZATION;  Surgeon: Medication Radiologist, MD;  Location: [facility] OR;  Service: Radiology;  Laterality: N/A;  . RADIOLOGY WITH  ANESTHESIA N/A 11/21/2014   Procedure: RADIOLOGY WITH ANESTHESIA;  Surgeon: Oneal GroutSanjeev K Deveshwar, MD;  Location: [facility] OR;  Service: Radiology;  Laterality: N/A;  . RADIOLOGY WITH ANESTHESIA N/A 02/27/2015   Procedure: RADIOLOGY WITH ANESTHESIA;  Surgeon: Julieanne CottonSanjeev Deveshwar, MD;  Location: [facility] OR;  Service: Radiology;  Laterality: N/A;  . RADIOLOGY WITH ANESTHESIA N/A 09/25/2015   Procedure: RADIOLOGY WITH ANESTHESIA;  Surgeon: Julieanne CottonSanjeev Deveshwar, MD;  Location: [facility] OR;  Service: Radiology;  Laterality: N/A;  . RADIOLOGY WITH ANESTHESIA N/A 05/10/2017   Procedure: EMBOLIZATION;  Surgeon: Julieanne Cottoneveshwar, Sanjeev, MD;  Location: [facility] OR;  Service: Radiology;  Laterality: N/A;  . RADIOLOGY WITH ANESTHESIA N/A 06/28/2017   Procedure: RADIOLOGY WITH ANESTHESIA-EMBOLIZATION;  Surgeon: Julieanne Cottoneveshwar, Sanjeev, MD;  Location: [facility] OR;  Service: Radiology;  Laterality: N/A;  . RADIOLOGY WITH ANESTHESIA N/A 08/26/2017   Procedure: RADIOLOGY WITH ANESTHESIA EMBOLIZATION;  Surgeon: Julieanne Cottoneveshwar, Sanjeev, MD;  Location: [facility] OR;  Service: Radiology;  Laterality: N/A;  . RADIOLOGY WITH ANESTHESIA N/A 11/22/2017   Procedure: EMBOLIZATION;  Surgeon: Julieanne Cottoneveshwar, Sanjeev, MD;  Location: [facility] OR;  Service: Radiology;  Laterality: N/A;  . RADIOLOGY WITH ANESTHESIA N/A 12/27/2017   Procedure: EMBOLIZATION;  Surgeon: Julieanne Cottoneveshwar, Sanjeev, MD;  Location: [facility] OR;  Service: Radiology;  Laterality: N/A;  . RECTAL SURGERY  1980s   "tore it lifting heavy furniture; sewed it back together"  . SHOULDER ARTHROSCOPY WITH OPEN ROTATOR CUFF REPAIR Right 2016  . SHOULDER ARTHROSCOPY WITH ROTATOR CUFF REPAIR Left 1996  . SHOULDER OPEN ROTATOR CUFF REPAIR Left 1997   "took out 8inches of my collarbone"  . TEE WITHOUT CARDIOVERSION N/A 12/18/2016   Procedure: TRANSESOPHAGEAL ECHOCARDIOGRAM (TEE);  Surgeon: Orpah CobbAjay Kadakia, MD;  [facility];  Service: Cardiovascular;  Laterality: N/A;  . TRACHEOSTOMY  1964   at age 56 months old; for asthma  . TRACHEOSTOMY  CLOSURE      Family History  Problem Relation Age of Onset  . Heart disease Mother        s/p 3V CABG  . Cancer Father 2972       lung cancer; +tobacco  . Stroke Father   . Cancer Maternal Grandmother   . Heart disease Maternal Grandmother   . Cancer Paternal Grandmother   . Lupus Sister   . Multiple sclerosis Sister   . Anemia Daughter     Social History:  reports that he quit smoking about 3 years ago. His smoking use included cigarettes. He started smoking  about 43 years ago. He has a 37.50 pack-year smoking history. He has never used smokeless tobacco. He reports that he drinks about 1.8 oz of alcohol per week. He reports that he does not use drugs.  Allergies:  Allergies  Allergen Reactions  . Tylox [Oxycodone-Acetaminophen] Hives, Other (See Comments) and Rash    Sweating, shaking Reaction: Tremors and diaphoresis   . Adhesive [Tape] Rash    Medications:  Scheduled:  Continuous: . sodium chloride 20 mL/hr at 04/08/18 5956    Results for orders placed or performed during the hospital encounter of 04/08/18 (from the past 24 hour(s))  Glucose, capillary     Status: None   Collection Time: 04/08/18  7:23 AM  Result Value Ref Range   Glucose-Capillary 92 70 - 99 mg/dL     No results found.  ROS:  As stated above in the HPI otherwise negative.  Blood pressure (!) 144/76, temperature 97.8 F (36.6 C), temperature source Oral, resp. rate 12, height 5\' 7"  (1.702 m), weight 114.3 kg (252 lb), SpO2 99 %.    PE: Gen: NAD, Alert and Oriented HEENT:  [place]/AT, EOMI Neck: Supple, no LAD Lungs: CTA Bilaterally CV: RRR without M/G/R ABM: Soft, NTND, +BS Ext: No C/C/E  Assessment/Plan: 1) Hematochezia. 2) GERD.  Plan: 1) EGD/Colonoscopy.  [name] D 04/08/2018, 8:04 AM

## 2018-04-08 NOTE — Op Note (Signed)
[facility] Patient Name: [name] Procedure Date: 04/08/2018 MRN: [id number] Attending MD: Jeani Hawking , MD Date of Birth: 30-Jan-1961 CSN: [id number] Age: 57 Admit Type: Outpatient Procedure:                Colonoscopy Indications:              Hematochezia Providers:                Jeani Hawking, MD, Tomma Rakers, RN, Beryle Beams, Technician Referring MD:              Medicines:                Fentanyl 75 micrograms IV, Midazolam 5 mg IV,                            Diphenhydramine 25 mg IV Complications:            No immediate complications. Estimated Blood Loss:     Estimated blood loss: none. Procedure:                Pre-Anesthesia Assessment:                           - Prior to the procedure, a History and Physical                            was performed, and patient medications and                            allergies were reviewed. The patient's tolerance of                            previous anesthesia was also reviewed. The risks                            and benefits of the procedure and the sedation                            options and risks were discussed with the patient.                            All questions were answered, and informed consent                            was obtained. Prior Anticoagulants: The patient has                            taken Eliquis (apixaban), last dose was 5 days                            prior to procedure. ASA Grade Assessment: III - A  patient with severe systemic disease. After                            reviewing the risks and benefits, the patient was                            deemed in satisfactory condition to undergo the                            procedure.                           - Sedation was administered by an endoscopy nurse.                            The sedation level attained was moderate.                           After  obtaining informed consent, the colonoscope                            was passed under direct vision. Throughout the                            procedure, the patient's blood pressure, pulse, and                            oxygen saturations were monitored continuously. The                            EC-[id number] ([id number]) scope was introduced through                            the anus and advanced to the the terminal ileum.                            The colonoscopy was performed without difficulty.                            The patient tolerated the procedure well. The                            quality of the bowel preparation was excellent. The                            terminal ileum, ileocecal valve, appendiceal                            orifice, and rectum were photographed. Scope In: 8:15:41 AM Scope Out: 8:37:25 AM Scope Withdrawal Time: 0 hours 17 minutes 59 seconds  Total Procedure Duration: 0 hours 21 minutes 44 seconds  Findings:      Two sessile polyps were found in the sigmoid colon and cecum. The polyps       were 2 to 4 mm in size. These  polyps were removed with a cold snare.       Resection and retrieval were complete.      A single large localized angiodysplastic lesion without bleeding was       found in the cecum. Coagulation for tissue destruction using monopolar       probe was successful. To prevent bleeding post-intervention, one       hemostatic clip was successfully placed (MR unsafe). There was no       bleeding at the end of the procedure. Impression:               - Two 2 to 4 mm polyps in the sigmoid colon and in                            the cecum, removed with a cold snare. Resected and                            retrieved.                           - A single non-bleeding colonic angiodysplastic                            lesion. Treated with a monopolar probe. Clip (MR                            unsafe) was placed. Moderate Sedation:      Moderate  (conscious) sedation was administered by the endoscopy nurse       and supervised by the endoscopist. The following parameters were       monitored: oxygen saturation, heart rate, blood pressure, and response       to care. Recommendation:           - Patient has a contact number available for                            emergencies. The signs and symptoms of potential                            delayed complications were discussed with the                            patient. Return to normal activities tomorrow.                            Written discharge instructions were provided to the                            patient.                           - Resume previous diet.                           - Continue present medications.                           -  Await pathology results.                           - Repeat colonoscopy in 5-10 years for surveillance.                           - Resume anticoagulation in 3 days. Procedure Code(s):        --- Professional ---                           [phone], Colonoscopy, flexible; with ablation of                            tumor(s), polyp(s), or other lesion(s) (includes                            pre- and post-dilation and guide wire passage, when                            performed)                           45385, 59, Colonoscopy, flexible; with removal of                            tumor(s), polyp(s), or other lesion(s) by snare                            technique Diagnosis Code(s):        --- Professional ---                           D12.5, Benign neoplasm of sigmoid colon                           D12.0, Benign neoplasm of cecum                           K55.20, Angiodysplasia of colon without hemorrhage                           K92.1, Melena (includes Hematochezia) CPT copyright 2017 [employer]. All rights reserved. The codes documented in this report are preliminary and upon coder review may  be revised to meet  current compliance requirements. Jeani HawkingPatrick Barbie Croston, MD Jeani HawkingPatrick Raenette Sakata, MD 04/08/2018 8:44:24 AM This report has been signed electronically. Number of Addenda: 0

## 2018-04-20 ENCOUNTER — Encounter (HOSPITAL_COMMUNITY): Payer: Self-pay | Admitting: Emergency Medicine

## 2018-04-20 ENCOUNTER — Emergency Department (HOSPITAL_COMMUNITY)
Admission: EM | Admit: 2018-04-20 | Discharge: 2018-04-20 | Disposition: A | Discharge status: Home / Self Care | Payer: Medicare Other | Attending: Emergency Medicine | Admitting: Emergency Medicine

## 2018-04-20 ENCOUNTER — Emergency Department (HOSPITAL_COMMUNITY): Payer: Medicare Other

## 2018-04-20 DIAGNOSIS — J449 Chronic obstructive pulmonary disease, unspecified: Secondary | ICD-10-CM | POA: Diagnosis not present

## 2018-04-20 DIAGNOSIS — Z7901 Long term (current) use of anticoagulants: Secondary | ICD-10-CM | POA: Insufficient documentation

## 2018-04-20 DIAGNOSIS — Z79899 Other long term (current) drug therapy: Secondary | ICD-10-CM | POA: Insufficient documentation

## 2018-04-20 DIAGNOSIS — Y92410 Unspecified street and highway as the place of occurrence of the external cause: Secondary | ICD-10-CM | POA: Insufficient documentation

## 2018-04-20 DIAGNOSIS — Y9389 Activity, other specified: Secondary | ICD-10-CM | POA: Diagnosis not present

## 2018-04-20 DIAGNOSIS — I4891 Unspecified atrial fibrillation: Secondary | ICD-10-CM | POA: Insufficient documentation

## 2018-04-20 DIAGNOSIS — Y999 Unspecified external cause status: Secondary | ICD-10-CM | POA: Insufficient documentation

## 2018-04-20 DIAGNOSIS — S161XXA Strain of muscle, fascia and tendon at neck level, initial encounter: Secondary | ICD-10-CM | POA: Diagnosis not present

## 2018-04-20 DIAGNOSIS — R51 Headache: Secondary | ICD-10-CM | POA: Diagnosis not present

## 2018-04-20 DIAGNOSIS — I2510 Atherosclerotic heart disease of native coronary artery without angina pectoris: Secondary | ICD-10-CM | POA: Insufficient documentation

## 2018-04-20 DIAGNOSIS — N189 Chronic kidney disease, unspecified: Secondary | ICD-10-CM | POA: Insufficient documentation

## 2018-04-20 DIAGNOSIS — Z8673 Personal history of transient ischemic attack (TIA), and cerebral infarction without residual deficits: Secondary | ICD-10-CM | POA: Diagnosis not present

## 2018-04-20 DIAGNOSIS — Z87891 Personal history of nicotine dependence: Secondary | ICD-10-CM | POA: Diagnosis not present

## 2018-04-20 DIAGNOSIS — E1122 Type 2 diabetes mellitus with diabetic chronic kidney disease: Secondary | ICD-10-CM | POA: Diagnosis not present

## 2018-04-20 DIAGNOSIS — Z7984 Long term (current) use of oral hypoglycemic drugs: Secondary | ICD-10-CM | POA: Insufficient documentation

## 2018-04-20 DIAGNOSIS — I129 Hypertensive chronic kidney disease with stage 1 through stage 4 chronic kidney disease, or unspecified chronic kidney disease: Secondary | ICD-10-CM | POA: Diagnosis not present

## 2018-04-20 DIAGNOSIS — S199XXA Unspecified injury of neck, initial encounter: Secondary | ICD-10-CM | POA: Diagnosis present
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
## 2018-04-20 NOTE — ED Notes (Signed)
Patient ambulated to the restroom.  Gait steady and even.   

## 2018-04-20 NOTE — Discharge Instructions (Signed)
Take Tylenol 500 mg every 6 hours for your symptoms.  You may also use warm or cool compresses to help with your muscle spasm in your neck.  Please follow up with your primary care provider within 5-7 days for re-evaluation of your symptoms. If you do not have a primary care provider, information for a healthcare clinic has been provided for you to make arrangements for follow up care. Please return to the emergency department for any new or worsening symptoms.
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
## 2018-05-05 ENCOUNTER — Other Ambulatory Visit (HOSPITAL_COMMUNITY): Payer: Self-pay | Admitting: Interventional Radiology

## 2018-05-05 DIAGNOSIS — I729 Aneurysm of unspecified site: Secondary | ICD-10-CM

## 2018-05-12 ENCOUNTER — Other Ambulatory Visit: Payer: Self-pay | Admitting: Radiology

## 2018-05-12 ENCOUNTER — Other Ambulatory Visit: Payer: Self-pay | Admitting: Student

## 2018-05-13 ENCOUNTER — Encounter (HOSPITAL_COMMUNITY): Payer: Self-pay

## 2018-05-13 ENCOUNTER — Other Ambulatory Visit (HOSPITAL_COMMUNITY): Payer: Self-pay | Admitting: Interventional Radiology

## 2018-05-13 ENCOUNTER — Ambulatory Visit (HOSPITAL_COMMUNITY)
Admission: RE | Admit: 2018-05-13 | Discharge: 2018-05-13 | Disposition: A | Discharge status: Home / Self Care | Payer: Medicare Other | Source: Ambulatory Visit | Attending: Interventional Radiology | Admitting: Interventional Radiology

## 2018-05-13 DIAGNOSIS — Z7902 Long term (current) use of antithrombotics/antiplatelets: Secondary | ICD-10-CM | POA: Insufficient documentation

## 2018-05-13 DIAGNOSIS — Z823 Family history of stroke: Secondary | ICD-10-CM | POA: Diagnosis not present

## 2018-05-13 DIAGNOSIS — Z87891 Personal history of nicotine dependence: Secondary | ICD-10-CM | POA: Diagnosis not present

## 2018-05-13 DIAGNOSIS — Z8249 Family history of ischemic heart disease and other diseases of the circulatory system: Secondary | ICD-10-CM | POA: Insufficient documentation

## 2018-05-13 DIAGNOSIS — M1990 Unspecified osteoarthritis, unspecified site: Secondary | ICD-10-CM | POA: Diagnosis not present

## 2018-05-13 DIAGNOSIS — I2510 Atherosclerotic heart disease of native coronary artery without angina pectoris: Secondary | ICD-10-CM | POA: Insufficient documentation

## 2018-05-13 DIAGNOSIS — N189 Chronic kidney disease, unspecified: Secondary | ICD-10-CM | POA: Insufficient documentation

## 2018-05-13 DIAGNOSIS — Z79899 Other long term (current) drug therapy: Secondary | ICD-10-CM | POA: Insufficient documentation

## 2018-05-13 DIAGNOSIS — Z7901 Long term (current) use of anticoagulants: Secondary | ICD-10-CM | POA: Diagnosis not present

## 2018-05-13 DIAGNOSIS — E785 Hyperlipidemia, unspecified: Secondary | ICD-10-CM | POA: Diagnosis not present

## 2018-05-13 DIAGNOSIS — I729 Aneurysm of unspecified site: Secondary | ICD-10-CM

## 2018-05-13 DIAGNOSIS — E1122 Type 2 diabetes mellitus with diabetic chronic kidney disease: Secondary | ICD-10-CM | POA: Diagnosis not present

## 2018-05-13 DIAGNOSIS — H269 Unspecified cataract: Secondary | ICD-10-CM | POA: Diagnosis not present

## 2018-05-13 DIAGNOSIS — Z888 Allergy status to other drugs, medicaments and biological substances status: Secondary | ICD-10-CM | POA: Insufficient documentation

## 2018-05-13 DIAGNOSIS — K219 Gastro-esophageal reflux disease without esophagitis: Secondary | ICD-10-CM | POA: Diagnosis not present

## 2018-05-13 DIAGNOSIS — I4891 Unspecified atrial fibrillation: Secondary | ICD-10-CM | POA: Diagnosis not present

## 2018-05-13 DIAGNOSIS — I129 Hypertensive chronic kidney disease with stage 1 through stage 4 chronic kidney disease, or unspecified chronic kidney disease: Secondary | ICD-10-CM | POA: Diagnosis not present

## 2018-05-13 DIAGNOSIS — Z7984 Long term (current) use of oral hypoglycemic drugs: Secondary | ICD-10-CM | POA: Diagnosis not present

## 2018-05-13 DIAGNOSIS — Z885 Allergy status to narcotic agent status: Secondary | ICD-10-CM | POA: Diagnosis not present

## 2018-05-13 DIAGNOSIS — G4730 Sleep apnea, unspecified: Secondary | ICD-10-CM | POA: Insufficient documentation

## 2018-05-13 DIAGNOSIS — I671 Cerebral aneurysm, nonruptured: Secondary | ICD-10-CM | POA: Insufficient documentation

## 2018-05-13 DIAGNOSIS — Z8673 Personal history of transient ischemic attack (TIA), and cerebral infarction without residual deficits: Secondary | ICD-10-CM | POA: Insufficient documentation

## 2018-05-13 DIAGNOSIS — Z9889 Other specified postprocedural states: Secondary | ICD-10-CM | POA: Insufficient documentation

## 2018-05-13 HISTORY — PX: IR ANGIO VERTEBRAL SEL SUBCLAVIAN INNOMINATE UNI R MOD SED: IMG5365

## 2018-05-13 HISTORY — PX: IR ANGIO INTRA EXTRACRAN SEL COM CAROTID INNOMINATE BILAT MOD SED: IMG5360

## 2018-05-13 LAB — BASIC METABOLIC PANEL
ANION GAP: 10 (ref 5–15)
BUN: 12 mg/dL (ref 6–20)
CALCIUM: 9.2 mg/dL (ref 8.9–10.3)
CO2: 29 mmol/L (ref 22–32)
Chloride: 102 mmol/L (ref 98–111)
Creatinine, Ser: 1.06 mg/dL (ref 0.61–1.24)
GFR calc Af Amer: >60 mL/min (ref >60)
GLUCOSE: 109 mg/dL — AB (ref 70–99)
Potassium: 4.1 mmol/L (ref 3.5–5.1)
Sodium: 141 mmol/L (ref 135–145)

## 2018-05-13 LAB — CBC
HCT: 40 % (ref 39.0–52.0)
Hemoglobin: 12.9 g/dL — ABNORMAL LOW (ref 13.0–17.0)
MCH: 29.3 pg (ref 26.0–34.0)
MCHC: 32.3 g/dL (ref 30.0–36.0)
MCV: 90.9 fL (ref 78.0–100.0)
Platelets: 215 10*3/uL (ref 150–400)
RBC: 4.4 MIL/uL (ref 4.22–5.81)
RDW: 12.6 % (ref 11.5–15.5)
WBC: 4.9 10*3/uL (ref 4.0–10.5)

## 2018-05-13 LAB — PROTIME-INR
INR: 1
PROTHROMBIN TIME: 13.1 s (ref 11.4–15.2)

## 2018-05-13 LAB — APTT: APTT: 28 s (ref 24–36)

## 2018-05-13 MED ORDER — LIDOCAINE HCL 1 % IJ SOLN
INTRAMUSCULAR | Status: AC
  Filled 2018-05-13: qty 20

## 2018-05-13 MED ORDER — MIDAZOLAM HCL 2 MG/2ML IJ SOLN
INTRAMUSCULAR | Status: AC | PRN
  Administered 2018-05-13: 1 mg via INTRAVENOUS

## 2018-05-13 MED ORDER — MIDAZOLAM HCL 2 MG/2ML IJ SOLN
INTRAMUSCULAR | Status: AC
  Filled 2018-05-13: qty 2

## 2018-05-13 MED ORDER — SODIUM CHLORIDE 0.9 % IV SOLN: INTRAVENOUS | Status: AC

## 2018-05-13 MED ORDER — HEPARIN SODIUM (PORCINE) 1000 UNIT/ML IJ SOLN
INTRAMUSCULAR | Status: AC | PRN
  Administered 2018-05-13: 1000 [IU] via INTRAVENOUS

## 2018-05-13 MED ORDER — LIDOCAINE HCL 1 % IJ SOLN
INTRAMUSCULAR | Status: AC | PRN
  Administered 2018-05-13: 10 mL

## 2018-05-13 MED ORDER — FENTANYL CITRATE (PF) 100 MCG/2ML IJ SOLN
INTRAMUSCULAR | Status: AC
  Filled 2018-05-13: qty 2

## 2018-05-13 MED ORDER — HEPARIN SODIUM (PORCINE) 1000 UNIT/ML IJ SOLN
INTRAMUSCULAR | Status: AC
  Filled 2018-05-13: qty 1

## 2018-05-13 MED ORDER — FENTANYL CITRATE (PF) 100 MCG/2ML IJ SOLN
INTRAMUSCULAR | Status: AC | PRN
  Administered 2018-05-13: 25 ug via INTRAVENOUS

## 2018-05-13 MED ORDER — SODIUM CHLORIDE 0.9 % IV SOLN
INTRAVENOUS | Status: DC
  Administered 2018-05-13: 08:00:00 via INTRAVENOUS

## 2018-05-13 MED ORDER — IOHEXOL 300 MG/ML  SOLN
150.0000 mL | Freq: Once | INTRAMUSCULAR | Status: AC | PRN
  Administered 2018-05-13: 55 mL via INTRA_ARTERIAL

## 2018-05-13 NOTE — Sedation Documentation (Signed)
Site and pulses checked with RN in Short Stay by this RN. No complications.
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
## 2018-05-13 NOTE — Procedures (Signed)
S/P bilateral common carotid arteriograms and RT [facility] angiogram  RT CFA appoach. Findings. 1Approx 1.719mm x 1.383mm Lt ACAprox A1 aneurysm 2.Approx 3..4 mm x 2.417mm RT MCA bifurcation aneurysm

## 2018-05-13 NOTE — Discharge Instructions (Addendum)
Angiogram, Care After °This sheet gives you information about how to care for yourself after your procedure. Your health care provider may also give you more specific instructions. If you have problems or questions, contact your health care provider. °What can I expect after the procedure? °After the procedure, it is common to have bruising and tenderness at the catheter insertion area. °Follow these instructions at home: °Insertion site care °· Follow instructions from your health care provider about how to take care of your insertion site. Make sure you: °? Wash your hands with soap and water before you change your bandage (dressing). If soap and water are not available, use hand sanitizer. °? Change your dressing as told by your health care provider. °? Leave stitches (sutures), skin glue, or adhesive strips in place. These skin closures may need to stay in place for 2 weeks or longer. If adhesive strip edges start to loosen and curl up, you may trim the loose edges. Do not remove adhesive strips completely unless your health care provider tells you to do that. °· Do not take baths, swim, or use a hot tub until your health care provider approves. °· You may shower 24-48 hours after the procedure or as told by your health care provider. °? Gently wash the site with plain soap and water. °? Pat the area dry with a clean towel. °? Do not rub the site. This may cause bleeding. °· Do not apply powder or lotion to the site. Keep the site clean and dry. °· Check your insertion site every day for signs of infection. Check for: °? Redness, swelling, or pain. °? Fluid or blood. °? Warmth. °? Pus or a bad smell. °Activity °· Rest as told by your health care provider, usually for 1-2 days. °· Do not lift anything that is heavier than 10 lbs. (4.5 kg) or as told by your health care provider. °· Do not drive for 24 hours if you were given a medicine to help you relax (sedative). °· Do not drive or use heavy machinery while  taking prescription pain medicine. °General instructions °· Return to your normal activities as told by your health care provider, usually in about a week. Ask your health care provider what activities are safe for you. °· If the catheter site starts bleeding, lie flat and put pressure on the site. If the bleeding does not stop, get help right away. This is a medical emergency. °· Drink enough fluid to keep your urine clear or pale yellow. This helps flush the contrast dye from your body. °· Take over-the-counter and prescription medicines only as told by your health care provider. °· Keep all follow-up visits as told by your health care provider. This is important. °Contact a health care provider if: °· You have a fever or chills. °· You have redness, swelling, or pain around your insertion site. °· You have fluid or blood coming from your insertion site. °· The insertion site feels warm to the touch. °· You have pus or a bad smell coming from your insertion site. °· You have bruising around the insertion site. °· You notice blood collecting in the tissue around the catheter site (hematoma). The hematoma may be painful to the touch. °Get help right away if: °· You have severe pain at the catheter insertion area. °· The catheter insertion area swells very fast. °· The catheter insertion area is bleeding, and the bleeding does not stop when you hold steady pressure on the area. °·   The area near or just beyond the catheter insertion site becomes pale, cool, tingly, or numb. °These symptoms may represent a serious problem that is an emergency. Do not wait to see if the symptoms will go away. Get medical help right away. Call your local emergency services (911 in the [place] Do not drive yourself to the hospital. °Summary °· After the procedure, it is common to have bruising and tenderness at the catheter insertion area. °· After the procedure, it is important to rest and drink plenty of fluids. °· Do not take baths,  swim, or use a hot tub until your health care provider says it is okay to do so. You may shower 24-48 hours after the procedure or as told by your health care provider. °· If the catheter site starts bleeding, lie flat and put pressure on the site. If the bleeding does not stop, get help right away. This is a medical emergency. °This information is not intended to replace advice given to you by your health care provider. Make sure you discuss any questions you have with your health care provider. °Document Released: 04/16/2005 Document Revised: 09/02/2016 Document Reviewed: 09/02/2016 °Elsevier Interactive Patient Education © 2018 [employer] °Moderate Conscious Sedation, Adult, Care After °These instructions provide you with information about caring for yourself after your procedure. Your health care provider may also give you more specific instructions. Your treatment has been planned according to current medical practices, but problems sometimes occur. Call your health care provider if you have any problems or questions after your procedure. °What can I expect after the procedure? °After your procedure, it is common: °· To feel sleepy for several hours. °· To feel clumsy and have poor balance for several hours. °· To have poor judgment for several hours. °· To vomit if you eat too soon. ° °Follow these instructions at home: °For at least 24 hours after the procedure: ° °· Do not: °? Participate in activities where you could fall or become injured. °? Drive. °? Use heavy machinery. °? Drink alcohol. °? Take sleeping pills or medicines that cause drowsiness. °? Make important decisions or sign legal documents. °? Take care of children on your own. °· Rest. °Eating and drinking °· Follow the diet recommended by your health care provider. °· If you vomit: °? Drink water, juice, or soup when you can drink without vomiting. °? Make sure you have little or no nausea before eating solid foods. °General  instructions °· Have a responsible adult stay with you until you are awake and alert. °· Take over-the-counter and prescription medicines only as told by your health care provider. °· If you smoke, do not smoke without supervision. °· Keep all follow-up visits as told by your health care provider. This is important. °Contact a health care provider if: °· You keep feeling nauseous or you keep vomiting. °· You feel light-headed. °· You develop a rash. °· You have a fever. °Get help right away if: °· You have trouble breathing. °This information is not intended to replace advice given to you by your health care provider. Make sure you discuss any questions you have with your health care provider. °Document Released: 07/19/2013 Document Revised: 03/02/2016 Document Reviewed: 01/18/2016 °Elsevier Interactive Patient Education © 2018 [employer] ° °

## 2018-07-18 ENCOUNTER — Other Ambulatory Visit: Payer: Self-pay | Admitting: *Deleted

## 2018-07-18 DIAGNOSIS — I872 Venous insufficiency (chronic) (peripheral): Secondary | ICD-10-CM

## 2018-08-22 ENCOUNTER — Telehealth: Payer: Self-pay | Admitting: Student

## 2018-08-22 NOTE — Telephone Encounter (Signed)
Received message from patient regarding new symptoms. He is known to [name] and has had multiple intracranial aneurysms embolized by Dr. [name], most recently left ACA A1 segment and left ICA aneurysms 12/27/2017.  Called patient. Patient states that he has had episodes of intermittent headaches for the past 3 days. States it starts with a "hot or burning feeling" around his whole head that lasts 5-6 minutes and then subsides. States that he then experiences a left temporal headache, rated 6/10, that lasts 5-6 minutes and then subsides. States this occurs intermittently about every 30 minutes. Denies vision changes or dizziness associated with headaches. States that he also feels fatigued.  Discussed case with Dr. [name].  Informed patient, per Dr. [name], that since he has a new onset of symptoms, the best course of management at this time would be to obtain a CT head (without contrast) ASAP. Informed patient that our schedulers will call him to set up this imaging scan once it has been approved by his insurance. Jennifer aware.  All questions answered and concerns addressed. Patient conveys understanding and agrees with plan.  Ivan Boga Kimm Ungaro, PA-C 08/22/2018, 2:53 PM

## 2018-08-24 ENCOUNTER — Ambulatory Visit (INDEPENDENT_AMBULATORY_CARE_PROVIDER_SITE_OTHER): Payer: Medicare Other | Admitting: Vascular Surgery

## 2018-08-24 ENCOUNTER — Encounter: Payer: Self-pay | Admitting: Vascular Surgery

## 2018-08-24 ENCOUNTER — Ambulatory Visit (HOSPITAL_COMMUNITY)
Admission: RE | Admit: 2018-08-24 | Discharge: 2018-08-24 | Disposition: A | Discharge status: Home / Self Care | Payer: Medicare Other | Source: Ambulatory Visit | Attending: Vascular Surgery | Admitting: Vascular Surgery

## 2018-08-24 ENCOUNTER — Other Ambulatory Visit: Payer: Self-pay

## 2018-08-24 VITALS — BP 123/76 | HR 54 | Temp 97.7°F | Resp 16 | Ht 68.0 in | Wt 254.0 lb

## 2018-08-24 DIAGNOSIS — I872 Venous insufficiency (chronic) (peripheral): Secondary | ICD-10-CM | POA: Insufficient documentation

## 2018-08-24 NOTE — Progress Notes (Signed)
Patient name: [name] MRN: [id number] DOB: 07/07/1961 Sex: male  REASON FOR VISIT:   Bilateral lower extremity swelling  HPI:   [name] is a pleasant 57 y.o. male who I had previously seen in the past with bilateral lower extremity swelling.  He had a venous duplex scan in January of this year when I originally seen him which showed no evidence of DVT bilaterally.  I felt that he likely had some chronic venous insufficiency and we discussed the importance of intermittent leg elevation and compression stockings.  He comes in today because over the last 2 weeks he developed significant swelling bilaterally and was concerned that he might have a DVT.  [place] feet for 6 hours at a time.  He has not been elevating his legs.  He has not been wearing compression stockings.  He is on Eliquis because of a history of arrhythmias in the past.  He is also had intracranial stents.  He scheduled for another stent in February.  He has stroke and 2015.  He has not had any chest pain or shortness of breath.  He denies pleuritic chest pain.  Past Medical History:  Diagnosis Date  . A-fib (HCC)   . Aneurysm (HCC)    brain  . Arthritis   . Atrial fibrillation (HCC)   . Bipolar disorder (HCC)    was on meds but was taken off 2 yrs ago and none since  . Burning pain    in both legs-seeing Dr.Sethi for this  . CAD (coronary artery disease)   . Cataracts, bilateral   . Childhood asthma    Last asthma attack at age 33; History of trach at 16 months (12/17/2016)  . Chronic kidney disease 2009  . Chronic lower back pain   . COPD (chronic obstructive pulmonary disease) (HCC) 06/2012   uses Spiriva and Albuterol daily as needed  . Depression   . Family history of adverse reaction to anesthesia    "Mom and sister have PONV"  . GERD (gastroesophageal reflux disease)   . H/O hiatal hernia   . Headache(784.0)    "q 2-3 weeks; daily last 2 wks" (12/17/2016)  .  Hyperlipidemia    takes Ramipril daily  . Hypertension    takes Ramipril daily  . Joint pain   . Joint swelling   . Middle cerebral artery aneurysm    right  . Nocturia   . Numbness    both arms   . Pneumonia    hx of-2014  . PONV (postoperative nausea and vomiting)    Pt reports nausea only.  . Sleep apnea    dx just this past week   08/21/2017  . Stroke [facility]) 2015   "drag left foot more since; have to wear glasses now" (12/17/2016)  . Type II diabetes mellitus (HCC)    takes Metformin daily   dx 2015  . Umbilical hernia   . Urinary frequency   . Wears dentures   . Wears glasses    and contact lenses    Family History  Problem Relation Age of Onset  . Heart disease Mother        s/p 3V CABG  . Cancer Father 79       lung cancer; +tobacco  . Stroke Father   . Cancer Maternal Grandmother   . Heart disease Maternal Grandmother   . Cancer Paternal Grandmother   . Lupus Sister   .  Multiple sclerosis Sister   . Anemia Daughter     SOCIAL HISTORY: Social History   Tobacco Use  . Smoking status: Former Smoker    Packs/day: 1.50    Years: 25.00    Pack years: 37.50    Types: Cigarettes    Start date: 01/15/1975    Last attempt to quit: 06/02/2014    Years since quitting: 4.2  . Smokeless tobacco: Never Used  Substance Use Topics  . Alcohol use: Yes    Alcohol/week: 3.0 standard drinks    Types: 3 Cans of beer per week    Comment: drinks 3 - 12oz bottles every monday, during shooting pool    Allergies  Allergen Reactions  . Tylox [Oxycodone-Acetaminophen] Hives, Other (See Comments) and Rash    Sweating, shaking Reaction: Tremors and diaphoresis   . Adhesive [Tape] Rash    Current Outpatient Medications  Medication Sig Dispense Refill  . albuterol (PROVENTIL HFA;VENTOLIN HFA) 108 (90 BASE) MCG/ACT inhaler Inhale 2 puffs into the lungs every 6 (six) hours as needed for wheezing or shortness of breath.     [name]. amiodarone (PACERONE) 200 MG tablet Take 1 tablet  (200 mg total) by mouth daily. 30 tablet 1  . apixaban (ELIQUIS) 5 MG TABS tablet Take 10 mg by mouth daily. Takes 2 pills per day    . atorvastatin (LIPITOR) 20 MG tablet Take 20 mg by mouth at bedtime.     . clopidogrel (PLAVIX) 75 MG tablet Take 1 tablet (75 mg total) by mouth daily. (Patient taking differently: Take 37.5 mg by mouth daily. )    . esomeprazole (NEXIUM) 40 MG capsule Take 40 mg by mouth daily as needed (Acid reflux).    . furosemide (LASIX) 40 MG tablet Take 40 mg by mouth daily.    [name]. gabapentin (NEURONTIN) 100 MG capsule TAKE 1 CAPSULE BY MOUTH THREE TIMES DAILY 270 capsule 3  . lidocaine (XYLOCAINE) 5 % ointment Apply 1 application topically as needed for moderate pain.    . metFORMIN (GLUCOPHAGE) 500 MG tablet Take 1 tablet (500 mg total) by mouth 2 (two) times daily with a meal. 60 tablet 0  . nitroGLYCERIN (NITROSTAT) 0.4 MG SL tablet Place 1 tablet (0.4 mg total) under the tongue every 5 (five) minutes x 3 doses as needed for chest pain. 25 tablet 1  . potassium chloride (K-DUR) 10 MEQ tablet Take 10 mEq by mouth daily.    [name]. umeclidinium-vilanterol (ANORO ELLIPTA) 62.5-25 MCG/INH AEPB Inhale 1 puff into the lungs daily.      No current facility-administered medications for this visit.     REVIEW OF SYSTEMS:  [X]  denotes positive finding, [ ]  denotes negative finding Cardiac  Comments:  Chest pain or chest pressure:    Shortness of breath upon exertion: x   Short of breath when lying flat:    Irregular heart rhythm:        Vascular    Pain in calf, thigh, or hip brought on by ambulation: x   Pain in feet at night that wakes you up from your sleep:  x   Blood clot in your veins:    Leg swelling:  x       Pulmonary    Oxygen at home:    Productive cough:  x   Wheezing:  x       Neurologic    Sudden weakness in arms or legs:     Sudden numbness in arms or legs:  Sudden onset of difficulty speaking or slurred speech:    Temporary loss of vision in one  eye:     Problems with dizziness:         Gastrointestinal    Blood in stool:     Vomited blood:         Genitourinary    Burning when urinating:     Blood in urine:        Psychiatric    Major depression:         Hematologic    Bleeding problems:    Problems with blood clotting too easily:        Skin    Rashes or ulcers:        Constitutional    Fever or chills:     PHYSICAL EXAM:   Vitals:   08/24/18 1450  BP: 123/76  Pulse: (!) 54  Resp: 16  Temp: 97.7 F (36.5 C)  TempSrc: Oral  SpO2: 96%  Weight: 254 lb (115.2 kg)  Height: 5\' 8"  (1.727 m)   GENERAL: The patient is a well-nourished male, in no acute distress. The vital signs are documented above. CARDIAC: There is a regular rate and rhythm.  VASCULAR: I do not detect carotid bruits. He has palpable femoral, popliteal, and pedal pulses bilaterally. He has bilateral lower extremity swelling. He has hyperpigmentation bilaterally. PULMONARY: There is good air exchange bilaterally without wheezing or rales. ABDOMEN: Soft and non-tender with normal pitched bowel sounds.  MUSCULOSKELETAL: There are no major deformities or cyanosis. NEUROLOGIC: No focal weakness or paresthesias are detected. SKIN: There are no ulcers or rashes noted. PSYCHIATRIC: The patient has a normal affect.  DATA:    VENOUS DUPLEX: I have independently interpreted his venous duplex scan today.  There is no evidence of DVT or superficial thrombophlebitis bilaterally.  MEDICAL ISSUES:   CHRONIC VENOUS INSUFFICIENCY: This patient has CEAP C-4a venous disease.  His duplex scan today shows no evidence of DVT bilaterally.  Given that he is on Eliquis I think he would be very unlikely to have a DVT.  I think his swelling is related to his venous insufficiency and the fact that has been on his feet a lot.  We have again discussed the importance of intermittent leg elevation and the proper positioning for this.  I have written a prescription for  knee-high compression stockings with a gradient of 15 to 20 mmHg.  I have encouraged him to avoid prolonged sitting and standing.  We discussed the importance of exercise specifically water aerobics and walking.  If his symptoms progress then certainly we could consider formal reflux testing.  I will see him back as needed.  Waverly Ferrari [facility] [phone]

## 2018-11-09 ENCOUNTER — Other Ambulatory Visit (HOSPITAL_COMMUNITY): Payer: Self-pay | Admitting: Interventional Radiology

## 2018-11-09 DIAGNOSIS — R51 Headache: Principal | ICD-10-CM

## 2018-11-09 DIAGNOSIS — R519 Headache, unspecified: Secondary | ICD-10-CM

## 2018-11-17 ENCOUNTER — Ambulatory Visit (HOSPITAL_COMMUNITY)
Admission: RE | Admit: 2018-11-17 | Discharge: 2018-11-17 | Disposition: A | Discharge status: Home / Self Care | Payer: Medicare Other | Source: Ambulatory Visit | Attending: Interventional Radiology | Admitting: Interventional Radiology

## 2018-11-17 DIAGNOSIS — R51 Headache: Secondary | ICD-10-CM | POA: Diagnosis not present

## 2018-11-17 DIAGNOSIS — R519 Headache, unspecified: Secondary | ICD-10-CM

## 2018-11-21 ENCOUNTER — Telehealth (HOSPITAL_COMMUNITY): Payer: Self-pay

## 2018-11-21 NOTE — Telephone Encounter (Signed)
Pt agreed to f/u in 6 months with cta head/neck. AW 

## 2018-12-06 ENCOUNTER — Encounter: Payer: Self-pay | Admitting: Adult Health

## 2018-12-15 ENCOUNTER — Ambulatory Visit (INDEPENDENT_AMBULATORY_CARE_PROVIDER_SITE_OTHER): Payer: Medicare Other | Admitting: Neurology

## 2018-12-15 ENCOUNTER — Encounter: Payer: Self-pay | Admitting: Neurology

## 2018-12-15 VITALS — BP 126/76 | HR 51 | Ht 68.0 in | Wt 263.0 lb

## 2018-12-15 DIAGNOSIS — J449 Chronic obstructive pulmonary disease, unspecified: Secondary | ICD-10-CM

## 2018-12-15 DIAGNOSIS — Z6838 Body mass index (BMI) 38.0-38.9, adult: Secondary | ICD-10-CM

## 2018-12-15 DIAGNOSIS — Z9989 Dependence on other enabling machines and devices: Secondary | ICD-10-CM

## 2018-12-15 DIAGNOSIS — E1159 Type 2 diabetes mellitus with other circulatory complications: Secondary | ICD-10-CM

## 2018-12-15 DIAGNOSIS — G4736 Sleep related hypoventilation in conditions classified elsewhere: Secondary | ICD-10-CM | POA: Insufficient documentation

## 2018-12-15 DIAGNOSIS — I4891 Unspecified atrial fibrillation: Secondary | ICD-10-CM

## 2018-12-15 DIAGNOSIS — G4733 Obstructive sleep apnea (adult) (pediatric): Secondary | ICD-10-CM

## 2018-12-15 DIAGNOSIS — E662 Morbid (severe) obesity with alveolar hypoventilation: Secondary | ICD-10-CM

## 2018-12-15 DIAGNOSIS — G4710 Hypersomnia, unspecified: Secondary | ICD-10-CM | POA: Diagnosis not present

## 2018-12-15 DIAGNOSIS — J4489 Other specified chronic obstructive pulmonary disease: Secondary | ICD-10-CM | POA: Insufficient documentation

## 2018-12-15 DIAGNOSIS — I729 Aneurysm of unspecified site: Secondary | ICD-10-CM

## 2018-12-15 NOTE — Patient Instructions (Signed)
Narcolepsy  Narcolepsy is a neurological disorder that causes you to fall asleep suddenly, and without control, during the daytime (sleep attacks). Narcolepsy is a lifelong (chronic) disorder.  Normally, sleep follows a regular cycle over the course of the night. After about 90 minutes of light sleep, your sleep should become deeper. When your sleep becomes deeper, your body moves less and you start dreaming. This type of deep sleep is called rapid eye movement (REM) sleep. When you have narcolepsy, your REM sleep is not well-regulated. This disrupts your sleep cycle, which causes daytime sleepiness.  What are the causes?  The cause of narcolepsy is not fully understood, but it may be related to:  Low levels of hypocretin, a chemical (neurotransmitter) in the brain that controls sleep and wake cycles. Hypocretin imbalance may be caused by:  Abnormal genes that are passed from parent to child (inherited).  The body's defense system (immune system) attacking hypocretin brain cells (autoimmune disease).  Infection, tumor, or injury in the area of the brain that controls sleep.  Exposure to poisons (toxins), such as heavy metals, pesticides, and secondhand smoke.  What are the signs or symptoms?  Symptoms of this condition include:  Excessive daytime sleepiness. This is the most common symptom and is usually the first symptom you will notice. This may affect your performance at work or school.  Sleep attacks. This means falling asleep suddenly and without control. You may fall asleep in the middle of an activity, especially low-energy activities like reading or watching TV.  Feeling like you cannot think clearly.  Trouble focusing or remembering things.  Feeling depressed.  Sudden muscle weakness (cataplexy). When this occurs, your speech may become slurred, or your knees may buckle. Cataplexy is usually triggered by surprise, anger, fear, or laughter.  Loss of the ability to speak or move (sleep paralysis). This may  occur just as you start to fall asleep or wake up. You will be aware of the paralysis. It usually lasts for just a few seconds or minutes.  Seeing, hearing, tasting, smelling, or feeling things that are not real (hallucinations). Hallucinations may occur with sleep paralysis. They can happen when you are falling asleep, waking up, or dozing.  Trouble staying asleep at night (insomnia).  Restless sleep.  How is this diagnosed?  This condition may be diagnosed based on:  A physical exam to rule out any other problems that may be causing your symptoms. You may be asked to write down your sleeping patterns for several weeks in a sleep diary. This will help your health care provider make a diagnosis.  Sleep studies that measure how well your REM sleep is regulated. These tests also measure your heart rate, breathing, movement, and brain waves. These tests include:  An overnight sleep study (polysomnogram).  A daytime sleep study that is done while you take several naps during the day (multiple sleep latency test, MSLT). This test measures how quickly you fall asleep and how quickly you enter REM sleep.  Removal of spinal fluid to measure hypocretin levels.  How is this treated?  There is no cure for this condition, but treatment can help relieve symptoms. Treatment may include:  Lifestyle and sleeping strategies to help you cope with the condition, such as:  Exercising regularly.  Maintaining a regular sleep schedule.  Avoiding caffeine and large meals before bed.  Medicines. These may include:  Medicines that help keep you awake and alert (stimulants) to fight daytime sleepiness.  Medicines that treat depression (antidepressants).   These may be used to treat cataplexy.  Sodium oxybate. This is a strong medicine to help you relax (sedative) that you may take at night. It can help control daytime sleepiness and cataplexy.  Follow these instructions at home:  Sleeping habits    Get about 8 hours of sleep every night.  Go to  sleep and get up at about the same time every day.  Keep your bedroom dark, quiet, and comfortable.  When you feel very tired, take short naps. Schedule naps so that you take them at about the same time every day.  Tell your employer or teachers that you have narcolepsy. You may be able to adjust your schedule to include time for naps.  Before bedtime:  Avoid bright lights and screens.  Relax. Try activities like reading or taking a warm bath.  Activity  Get at least 20 minutes of exercise every day. This will help you sleep better at night and reduce daytime sleepiness.  Avoid exercising within 3 hours of bedtime.  If you are sleepy, do not drive or use heavy machinery.  If possible, take a nap before driving.  Do not swim or go out on the water without a life jacket.  Eating and drinking  Do not drink alcohol or caffeinated beverages within 4-5 hours of bedtime.  Do not eat a lot of food before bedtime. Eat meals at about the same times every day.  General instructions    Take over-the-counter and prescription medicines only as told by your health care provider.  If directed, keep a sleep diary.  Tell your employer or teachers that you have narcolepsy. You may be able to adjust your schedule to include time for naps.  Do not use any products that contain nicotine or tobacco, such as cigarettes and e-cigarettes. If you need help quitting, ask your health care provider.  Keep all follow-up visits as told by your health care provider. This is important.  Contact a health care provider if:  Your symptoms are not getting better.  You have increasingly high blood pressure (hypertension).  You have changes in your heart rhythm.  You are having a hard time determining what is real and what is not (psychosis).  Get help right away if:  You hurt yourself during a sleep attack or an attack of cataplexy.  You have chest pain.  You have trouble breathing.  This information is not intended to replace advice given to you by your  health care provider. Make sure you discuss any questions you have with your health care provider.  Document Released: 09/18/2002 Document Revised: 09/21/2016 Document Reviewed: 09/21/2016  Elsevier Interactive Patient Education  2019 [employer]

## 2018-12-15 NOTE — Progress Notes (Signed)
SLEEP MEDICINE CLINIC   Provider:  Larey [name], M D  Primary Care Physician:  Ivan Limbo, MD   Referring Provider: Bernerd Limbo, MD  Via Dr. Estanislado Pandy, MD-   Dr. Leonie [name] and Dr. Erlinda [name]    Chief Complaint  Patient presents with  . Follow-up    pt with mom, rm 11. pt states that machine is working well. DME [employer]. pt states about 6-7 months he started developing daytime sleepiness after he eats.     [name] is a 58 y.o. male patient seen here in a RV , patient of Dr. Erlinda [name] and Ivan Man, MD. I have the pleasure of meeting [name] today on 15 December 2018 and he has become a highly compliant CPAP patient is using a C-Flex machine with a average user time of 7 hours and 5 minutes daily, 100% of the last 30 days were with device usage and all these days with over 4 hours of daily use.  His residual AHI is 2.5 CPAP is set at 12 cmH2O with 3 cm C-Flex setting and a ramp time of 20 minutes.  He actually sleeps well and endorses that his sleep has improved but he continues to endorse the Epworth Sleepiness Scale at 15 out of 24 points and the fatigue severity at 47 out of 63 points.  These are very elevated levels.   He reports the irresistible urge to sleep, vivid dreams, no sleep paralysis, dream intrusion but no cataplexy. He starts crying when he wants to laugh.   We have to look for other reasons of hypersomnia. He is rested and restored. His diabetes and HTN have been better controlled.   He has started to exercise daily he has changed his lifestyle his current BMI is 39.99, he has physiological bradycardia - rate controlled atrial fibrillation.  He quit smoking in 2015 the year of his stroke.     HPI:  [name] is a 58 y.o. male , seen here as in a referral  from [name] for a sleep evaluation.   Patient of Dr. Erlinda [name] , who treated him for a stroke in 2015, followed up with Dr. Larey Dresser as well. Has been undergoing aneurysmata  stenting/ coiling with Ivan Rude, MD.  [name] is a 58 year old Caucasian right-handed male, who presents today to the sleep consultation with his mother. He stated that Dr. Estanislado [name] spoke to him after his last interventional radiology procedure, which was meant to coil a nonbleeding aneurysm. He was told that his oxygen levels at night had dropped significantly and that the telemetry nurses were concerned. Dr. Estanislado [name] asked the patient to contact his primary care physician about an urgent sleep study.  [name], who treats him as his family doctor, quoted that the patient has periods of not breathing at night, awakening in the middle of the night because of shortness of breath, difficulties falling asleep once woken. He is not sure when the symptoms originally began. On July 30 [name] underwent embolization of a brain aneurysm without any complications. 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 The day after the procedure he was started on double antiplatelet therapy including Plavix and full size aspirin. He was asked to return in 14 days later to switch to [facility] was. It was during his hospitalization that his pulse ox decreased dramatically in the nighttime. The patient has associated comorbidities that make him prone for sleep apnea, and make sleep  apnea a risk factor for future strokes or heart attacks. The patient carries a diagnosis of diabetes mellitus type 2, diagnosed at age 70, hypercholesterolemia, atrial fibrillation, migraine and bipolar disease with depression. He also reported swelling in his legs, fluid water gain, and had periods of passing out, fainting. Increased thirst and joint pain.   Chief complaint according to patient : " I get low oxygen at night"  Sleep habits are as follows:He has a late sleep night usually goes to bed after midnight between 12:30 AM and 2:30 AM and awakens up to 4 times at night. He watches netflix before going to bed, his son is 30 and with  him, and his son retreats to his won bedroom at about 10 PM.  The patient sleeps in a cool, quiet and dark bedroom- after the TV is off.  He wakes frequently being short of breath, wheezing, and in distress. He wakes up every hour, and he has nocturia times 5 each night. He sleeps on 3 pillows, non adjustable bed. No oxygen use. Sleeps prone and on his side. Wakes at 7 AM gets his son to school. Averages only 5 hours - sleep deprived.   Sleep medical history /family sleep history: has been sleeping as described above since his stroke 2015, loss of his job.  Aneurysm surgery 2019, and check up this month.   family history of OSA, older sister on CPAP and o2, great grandson with sleep apnea.  Social history: The patient is disabled, not working and a single parent. Drove a truck. Smoking started at age 73- quit in 05-2014.  ETOH use: used to drink 3 beers a week, not longer, caffeine ; 2 coffees  a day in AM, the rest of the day it's water.    Interval history from 13 December 2017, I have the pleasure of meeting with [name] today, a 58 year old Caucasian right-handed gentleman who underwent a sleep study on 27 July 2017, for which she was referred after a stroke.  He also has significant hypoxemia.  His sleep apnea was mild to moderate with an AHI of 17.4/h, during REM sleep accentuated to 23.7/h and in supine sleep his apnea is exacerbated to 47.5/h.  He also had 52 minutes of oxygen desaturation in total in a sleep study but recorded 370 minutes of sleep.  He took bathroom breaks during the sleep study with a nadir of oxygen at 68%.  He was asked to return for CPAP titration on 14 August 2017.  CPAP corrected his apnea to 0.8/h and he was titrated to a total pressure of 12 cmH2O he is using a full face mask, small size with the name Simplus.  I also have the pleasure of seeing his first download today including 27 February he has been for 30 days 100% compliant with CPAP use, average use of time is 6  hours 49 minutes, average AHI is 3.1 at 12 cmH2O pressure, humidifier set at 3, ramp time is 20 minutes, C-Flex setting is 3 cm of water.  The needs to be no changes made, the patient's daytime sleepiness has significantly decreased to 6 points and his fatigue score to 41 points.  He also states that his nocturnal bathroom trips have become rare. He is rested and restored. His diabetes and HTN have been better controlled.  He is having cerebral aneurysm surgery on March 18 th and is nervous about this.   Review of Systems: Out of a complete 14 system review, the patient complains  of only the following symptoms, and all other reviewed systems are negative.  During the daytime he no longer  excessively sleepy and endorsed today the Epworth sleepiness score at 6/ 24 points, but the fatigue severity score  at 41 points. Depression : feels not depressed, anxiety - yes. Has vascular neuro-surgery coming up.    Social History   Socioeconomic History  . Marital status: Divorced    Spouse name: not together since 2007  . Number of children: 3  . Years of education: 10  . Highest education level: Not on file  Occupational History  . Occupation: Retail buyer: [employer]    Comment: airport   Social Needs  . Financial resource strain: Not on file  . Food insecurity:    Worry: Not on file    Inability: Not on file  . Transportation needs:    Medical: Not on file    Non-medical: Not on file  Tobacco Use  . Smoking status: Former Smoker    Packs/day: 1.50    Years: 25.00    Pack years: 37.50    Types: Cigarettes    Start date: 01/15/1975    Last attempt to quit: 06/02/2014    Years since quitting: 4.5  . Smokeless tobacco: Never Used  Substance and Sexual Activity  . Alcohol use: Yes    Alcohol/week: 3.0 standard drinks    Types: 3 Cans of beer per week    Comment: drinks 3 - 12oz bottles every monday, during shooting pool  . Drug use: No  . Sexual activity: Never     Partners: Female    Comment: partner has had surgery to prevent pregnancy  Lifestyle  . Physical activity:    Days per week: Not on file    Minutes per session: Not on file  . Stress: Not on file  Relationships  . Social connections:    Talks on phone: Not on file    Gets together: Not on file    Attends religious service: Not on file    Active member of club or organization: Not on file    Attends meetings of clubs or organizations: Not on file    Relationship status: Not on file  . Intimate partner violence:    Fear of current or ex partner: Not on file    Emotionally abused: Not on file    Physically abused: Not on file    Forced sexual activity: Not on file  Other Topics Concern  . Not on file  Social History Narrative   Lives with his son and his mother.  His son has no contact with the son's mother, though the patient is not legally separated from her.  She has a history of drug use and has been in prison several times.    Family History  Problem Relation Age of Onset  . Heart disease Mother        s/p 3V CABG  . Cancer Father 63       lung cancer; +tobacco  . Stroke Father   . Cancer Maternal Grandmother   . Heart disease Maternal Grandmother   . Cancer Paternal Grandmother   . Lupus Sister   . Multiple sclerosis Sister   . Anemia Daughter     Past Medical History:  Diagnosis Date  . A-fib ([place])   . Aneurysm ([place])    brain  . Arthritis   . Atrial fibrillation ([place])   . Bipolar disorder ([place])  was on meds but was taken off 2 yrs ago and none since  . Burning pain    in both legs-seeing IvanSethi for this  . CAD (coronary artery disease)   . Cataracts, bilateral   . Childhood asthma    Last asthma attack at age 47; History of trach at 16 months (12/17/2016)  . Chronic kidney disease 2009  . Chronic lower back pain   . COPD (chronic obstructive pulmonary disease) ([place]) 06/2012   uses Spiriva and Albuterol daily as needed  . Depression   . Family history  of adverse reaction to anesthesia    "Mom and sister have PONV"  . GERD (gastroesophageal reflux disease)   . H/O hiatal hernia   . Headache(784.0)    "q 2-3 weeks; daily last 2 wks" (12/17/2016)  . Hyperlipidemia    takes Ramipril daily  . Hypertension    takes Ramipril daily  . Joint pain   . Joint swelling   . Middle cerebral artery aneurysm    right  . Nocturia   . Numbness    both arms   . Pneumonia    hx of-2014  . PONV (postoperative nausea and vomiting)    Pt reports nausea only.  . Sleep apnea    dx just this past week   08/21/2017  . Stroke [facility]) 2015   "drag left foot more since; have to wear glasses now" (12/17/2016)  . Type II diabetes mellitus ([place])    takes Metformin daily   dx 2015  . Umbilical hernia   . Urinary frequency   . Wears dentures   . Wears glasses    and contact lenses   Attestation signed by Deno Etienne, DO at 01/13/2017 3:21 PM  I have personally seen and examined the patient and discussed plan of care with the resident.  I was present for entire procedure.  I have reviewed the appropriate documentation on PMH/FH/Soc. History.  I have reviewed the documentation of the resident and agree.        EKG Interpretation  Date/Time:                  Tuesday January 12 2017 18:32:02 EDT Ventricular Rate:   110 PR Interval:                        QRS Duration:        109 QT Interval:                      373 QTC Calculation:    505 R Axis:                         -13 Text Interpretation:  Atrial fibrillation Abnormal R-wave progression, early transition Prolonged QT interval Otherwise no significant change Confirmed by Tyrone Nine MD, DANIEL [phone]) on 01/12/2017 6:42:25 PM       58 yo M With a chief complaint of symptomatic atrial fibrillation. Been going on for the past day. Patient is artery anticoagulated on eliquis.  The case was discussed with Dr. Terrence Dupont who recommended cardioversion.  On my exam patient with a irregularly irregular heart rate  well-appearing and nontoxic. Clear lung sounds. No noted edema.  Chads2vasc of 2, already on eliquis.  D/c home.      Past Surgical History:  Procedure Laterality Date  . ANEURYSM COILING  2015  . CARDIOVERSION N/A 12/18/2016   Procedure: CARDIOVERSION;  Surgeon: Dixie Dials, MD;  Location: [place];  Service: Cardiovascular;  Laterality: N/A;  . COLONOSCOPY    . COLONOSCOPY N/A 04/08/2018   Procedure: COLONOSCOPY;  Surgeon: Carol Ada, MD;  Location: [facility];  Service: Endoscopy;  Laterality: N/A;  . ESOPHAGOGASTRODUODENOSCOPY N/A 04/08/2018   Procedure: ESOPHAGOGASTRODUODENOSCOPY (EGD);  Surgeon: Carol Ada, MD;  Location: [facility];  Service: Endoscopy;  Laterality: N/A;  . EXCISIONAL HEMORRHOIDECTOMY  1980s   "soon after rectal OR"  . GANGLION CYST EXCISION Right   . HOT HEMOSTASIS N/A 04/08/2018   Procedure: HOT HEMOSTASIS (ARGON PLASMA COAGULATION/BICAP);  Surgeon: Carol Ada, MD;  Location: [facility];  Service: Endoscopy;  Laterality: N/A;  . IR 3D INDEPENDENT WKST  05/10/2017  . IR 3D INDEPENDENT WKST  06/28/2017  . IR ANGIO INTRA EXTRACRAN SEL COM CAROTID INNOMINATE BILAT MOD SED  04/22/2017  . IR ANGIO INTRA EXTRACRAN SEL COM CAROTID INNOMINATE BILAT MOD SED  11/22/2017  . IR ANGIO INTRA EXTRACRAN SEL COM CAROTID INNOMINATE BILAT MOD SED  05/13/2018  . IR ANGIO INTRA EXTRACRAN SEL INTERNAL CAROTID UNI L MOD SED  12/27/2017  . IR ANGIO INTRA EXTRACRAN SEL INTERNAL CAROTID UNI R MOD SED  05/10/2017  . IR ANGIO INTRA EXTRACRAN SEL INTERNAL CAROTID UNI R MOD SED  06/28/2017  . IR ANGIO VERTEBRAL SEL SUBCLAVIAN INNOMINATE UNI L MOD SED  04/22/2017  . IR ANGIO VERTEBRAL SEL SUBCLAVIAN INNOMINATE UNI R MOD SED  06/28/2017  . IR ANGIO VERTEBRAL SEL SUBCLAVIAN INNOMINATE UNI R MOD SED  11/22/2017  . IR ANGIO VERTEBRAL SEL SUBCLAVIAN INNOMINATE UNI R MOD SED  05/13/2018  . IR ANGIO VERTEBRAL SEL VERTEBRAL UNI R MOD SED  04/22/2017  . IR ANGIOGRAM FOLLOW UP STUDY  05/10/2017  .  IR ANGIOGRAM FOLLOW UP STUDY  06/28/2017  . IR ANGIOGRAM FOLLOW UP STUDY  12/27/2017  . IR NEURO EACH ADD'L AFTER BASIC UNI LEFT (MS)  12/27/2017  . IR NEURO EACH ADD'L AFTER BASIC UNI RIGHT (MS)  05/10/2017  . IR NEURO EACH ADD'L AFTER BASIC UNI RIGHT (MS)  06/28/2017  . IR RADIOLOGIST EVAL & MGMT  04/30/2017  . IR RADIOLOGIST EVAL & MGMT  06/15/2017  . IR RADIOLOGIST EVAL & MGMT  07/13/2017  . IR RADIOLOGIST EVAL & MGMT  01/11/2018  . IR TRANSCATH/EMBOLIZ  05/10/2017  . IR TRANSCATH/EMBOLIZ  06/28/2017  . IR TRANSCATH/EMBOLIZ  12/27/2017  . MULTIPLE TOOTH EXTRACTIONS    . POLYPECTOMY  04/08/2018   Procedure: POLYPECTOMY;  Surgeon: Carol Ada, MD;  Location: [facility];  Service: Endoscopy;;  . RADIOLOGY WITH ANESTHESIA N/A 08/08/2014   Procedure: RADIOLOGY WITH ANESTHESIA EMBOLIZATION;  Surgeon: Medication Radiologist, MD;  Location: [place];  Service: Radiology;  Laterality: N/A;  . RADIOLOGY WITH ANESTHESIA N/A 11/21/2014   Procedure: RADIOLOGY WITH ANESTHESIA;  Surgeon: Rob Hickman, MD;  Location: [place];  Service: Radiology;  Laterality: N/A;  . RADIOLOGY WITH ANESTHESIA N/A 02/27/2015   Procedure: RADIOLOGY WITH ANESTHESIA;  Surgeon: Luanne Bras, MD;  Location: [place];  Service: Radiology;  Laterality: N/A;  . RADIOLOGY WITH ANESTHESIA N/A 09/25/2015   Procedure: RADIOLOGY WITH ANESTHESIA;  Surgeon: Luanne Bras, MD;  Location: [place];  Service: Radiology;  Laterality: N/A;  . RADIOLOGY WITH ANESTHESIA N/A 05/10/2017   Procedure: EMBOLIZATION;  Surgeon: Luanne Bras, MD;  Location: [place];  Service: Radiology;  Laterality: N/A;  . RADIOLOGY WITH ANESTHESIA N/A 06/28/2017   Procedure: RADIOLOGY WITH ANESTHESIA-EMBOLIZATION;  Surgeon: Luanne Bras, MD;  Location:  [place] [facility];  Service: Radiology;  Laterality: N/A;  . RADIOLOGY WITH ANESTHESIA N/A 08/26/2017   Procedure: RADIOLOGY WITH ANESTHESIA EMBOLIZATION;  Surgeon: Luanne Bras, MD;  Location: [place];  Service:  Radiology;  Laterality: N/A;  . RADIOLOGY WITH ANESTHESIA N/A 11/22/2017   Procedure: EMBOLIZATION;  Surgeon: Luanne Bras, MD;  Location: [place];  Service: Radiology;  Laterality: N/A;  . RADIOLOGY WITH ANESTHESIA N/A 12/27/2017   Procedure: EMBOLIZATION;  Surgeon: Luanne Bras, MD;  Location: [place];  Service: Radiology;  Laterality: N/A;  . RECTAL SURGERY  1980s   "tore it lifting heavy furniture; sewed it back together"  . SHOULDER ARTHROSCOPY WITH OPEN ROTATOR CUFF REPAIR Right 2016  . SHOULDER ARTHROSCOPY WITH ROTATOR CUFF REPAIR Left 1996  . SHOULDER OPEN ROTATOR CUFF REPAIR Left 1997   "took out 8inches of my collarbone"  . TEE WITHOUT CARDIOVERSION N/A 12/18/2016   Procedure: TRANSESOPHAGEAL ECHOCARDIOGRAM (TEE);  Surgeon: Dixie Dials, MD;  Location: [facility] END[facility]COPY;  Service: Cardiovascular;  Laterality: N/A;  . TRACHEOSTOMY  1964   at age 40 months old; for asthma  . TRACHEOSTOMY CLOSURE      Current Outpatient Medications  Medication Sig Dispense Refill  . albuterol (PROVENTIL HFA;VENTOLIN HFA) 108 (90 BASE) MCG/ACT inhaler Inhale 2 puffs into the lungs every 6 (six) hours as needed for wheezing or shortness of breath.     [name] amiodarone (PACERONE) 200 MG tablet Take 1 tablet (200 mg total) by mouth daily. 30 tablet 1  . apixaban (ELIQUIS) 5 MG TABS tablet Take 5 mg by mouth 2 (two) times daily.     [name] atorvastatin (LIPITOR) 20 MG tablet Take 20 mg by mouth at bedtime.     . clopidogrel (PLAVIX) 75 MG tablet Take 1 tablet (75 mg total) by mouth daily. (Patient taking differently: Take 37.5 mg by mouth daily. )    . esomeprazole (NEXIUM) 40 MG capsule Take 40 mg by mouth daily as needed (Acid reflux).    . furosemide (LASIX) 40 MG tablet Take 40 mg by mouth daily.    [name] gabapentin (NEURONTIN) 100 MG capsule TAKE 1 CAPSULE BY MOUTH THREE TIMES DAILY 270 capsule 3  . lidocaine (XYLOCAINE) 5 % ointment Apply 1 application topically as needed for moderate pain.    . metFORMIN  (GLUCOPHAGE) 500 MG tablet Take 1 tablet (500 mg total) by mouth 2 (two) times daily with a meal. 60 tablet 0  . nitroGLYCERIN (NITROSTAT) 0.4 MG SL tablet Place 1 tablet (0.4 mg total) under the tongue every 5 (five) minutes x 3 doses as needed for chest pain. 25 tablet 1  . potassium chloride (K-DUR) 10 MEQ tablet Take 10 mEq by mouth daily.    [name] umeclidinium-vilanterol (ANORO ELLIPTA) 62.5-25 MCG/INH AEPB Inhale 1 puff into the lungs daily.      No current facility-administered medications for this visit.     Allergies as of 12/15/2018 - Review Complete 12/15/2018  Allergen Reaction Noted  . Tylox [oxycodone-acetaminophen] Hives, Other (See Comments), and Rash 10/19/2012  . Adhesive [tape] Rash 11/20/2014  Chief Complaint: Patient was seen in consultation today for aneurysm  Supervising Physician: Luanne Bras  Patient Status: [facility] - Out-pt  History of Present Illness: [name] is a 58 y.o. malestatus post endovascular treatment of unruptured left middle cerebral artery aneurysm with stent assisted coiling, on 08/13/2014, followed by endovascular stent assisted angioplasty of the symptomatic right internal carotid artery stenosis with distal protection in December of 2016. Since this time he  has continued with intermittent headaches.    He underwent cerebral angiogram 04/22/17 which showed: 1.Obliterated previous Lt MCA aneurysm with patent adjacent stent. 2.Approx 4.33m x 4.4 mm RT MCA bifurcation aneurysm 3.Approx 2.867mx 1.29m75mrox Lt ACA A 1 seg aneurysm  He presents today for cerebral angiogram with possible intervention.  Patient is aware degree of intervention will be based on findings.   He has been NPO.  He has held his Elliquis for 1 week.  Dr DevArlean Hoppingte from earlier this summer, 7-30- 2018 , stenting and coiling.    Vitals: BP 126/76   Pulse (!) 51   Ht _0  (1.727 m)   Wt 263 lb (119.3 kg)   BMI 39.99 kg/m  Last Weight:  Wt Readings  from Last 1 Encounters:  12/15/18 263 lb (119.3 kg)   BMIKGM:WNUUss index is 39.99 kg/m.     Last Height:   Ht Readings from Last 1 Encounters:  12/15/18 _1  (1.727 m)    Physical exam:  General: The patient is awake, alert and appears not in acute distress. The patient is well groomed. Head: Normocephalic, atraumatic. Neck is supple. Mallampati 4,  neck circumference: 19 . Nasal airflow congested , hoarseness is noted.  TMJ click :not evident . Retrognathia is not seen. Prognathia .  Cardiovascular:  Regular rate and rhythm , without  murmurs or carotid bruit, and without distended neck veins. Respiratory: Lungs are clear to auscultation. Skin:  Without evidence of edema, or rash- he wears compression stockings and sleeve for compression- Dr. DixDoren Custardcommended a pillow  that elevates his knees and legs.  Trunk: BMI is 39.99- has increased, not decreased but he has gained muscle mass.  The patient's posture is erect, he still has a large belly.   Neurologic exam : The patient is awake and alert, oriented to place and time.   Memory subjective  described as intact.   MMSE - Mini Mental State Exam 08/29/2014  Orientation to time 5  Orientation to Place 5  Registration 3  Attention/ Calculation 5  Recall 2  Language- name 2 objects 2  Language- repeat 1  Language- follow 3 step command 3  Language- read & follow direction 1  Write a sentence 1  Copy design 1  Total score 29   Attention span & concentration ability appears normal.  Speech is fluent,  with dysphonia   Mood and affect are appropriate.  Cranial nerves: Pupils are equal and briskly reactive to light.  Extraocular movements  in vertical and horizontal planes intact and without nystagmus. Visual fields by finger perimetry are intact. Hearing to finger rub intact.  Facial motor strength is symmetric and tongue and uvula move midline. Shoulder shrug was symmetrical.   Motor exam: Normal tone, muscle bulk and  symmetric strength in all extremities. Sensory:  Fine touch, pinprick and vibration were normal. No burning pain in either leg !  Coordination: Rapid alternating movements in the fingers/hands was normal. Finger-to-nose maneuver - without evidence of ataxia, dysmetria or tremor. Gait and station: Patient walks without assistive device . Deep tendon reflexes: in the upper and lower extremities are symmetric .   Assessment:  After physical and neurologic examination, review of laboratory studies,  Personal review of imaging studies, reports of other /same  Imaging studies, results of polysomnography and / or neurophysiology testing and pre-existing records as far as provided in visit., my assessment is   1)[name] diagnosed with mild- moderate obstructive sleep apnea ,  and hypoxemia- OSA overlapped with COPD. CPAP use is 100% compliant - yet he remains fatigued and with persistent hypersomnia.   I reviewed his DM control, His BP and heart rate and his Hgb and Hct. The anemia is improved, the DM control is too.   He benefits form CPAP therapy- He noted after CPAP therapy a decrease in nocturia frequency, palpitations from atrial fibrillation . Significant benefit and compliance is excellent 12 cm with 3 cm EPR>   The patient was advised of the nature of the diagnosed disorder , the treatment options and the  risks for general health and wellness arising from not treating the condition.  I spent more than 30 minutes of face to face time with the patient.  Greater than 50% of time was spent in counseling and coordination of care. We have discussed the diagnosis and differential and I answered the patient's questions.   Will order ONO on CPAP again- hypoxemia may play a role, and I ordered an HLA narcolepsy panel he described an irresistible urge to sleep, even in conversations. EPWORTH SCORE !%.   He will meet in 3 month with NP. We may initiate SUNOSI or MODAFINIL- caveat atrial fibrillation.      Ivan Seat, MD   12/19/6726, 9:79 AM  Certified in Neurology by ABPN Certified in [place] by [facility] [address], [place], [place] [address]

## 2018-12-29 ENCOUNTER — Encounter: Payer: Self-pay | Admitting: Neurology

## 2018-12-29 LAB — NARCOLEPSY EVALUATION
DQA1*01:02: POSITIVE
HLA-DQ BETA: NEGATIVE

## 2019-02-17 NOTE — Telephone Encounter (Signed)
Overnight Pulseoximetry -  02-13-2019 for [name].  On CPAP and on room air- , duration 9 hours and 17 seconds.   Total time in Hypoxia was 1 min 27 sec.   The patient would not qualify for oxygen supplement.   Melvyn Novas, MD

## 2019-02-20 ENCOUNTER — Encounter: Payer: Self-pay | Admitting: Neurology

## 2019-03-14 ENCOUNTER — Telehealth: Payer: Self-pay | Admitting: *Deleted

## 2019-03-14 NOTE — Telephone Encounter (Signed)
Due to current COVID 19 pandemic, our office is severely reducing in office visits until further notice, in order to minimize the risk to our patients and healthcare providers.  Pt understands that although there may be some limitations with this type of visit, we will take all precautions to reduce any security or privacy concerns.  Pt understands that this will be treated like an in office visit and we will file with pt's insurance, and there may be a patient responsible charge related to this service.Pt consented to doxy.me visit.  Email sent [email or web address].

## 2019-03-15 ENCOUNTER — Encounter: Payer: Self-pay | Admitting: Adult Health

## 2019-03-20 ENCOUNTER — Ambulatory Visit (INDEPENDENT_AMBULATORY_CARE_PROVIDER_SITE_OTHER): Payer: Medicare Other | Admitting: Adult Health

## 2019-03-20 ENCOUNTER — Other Ambulatory Visit: Payer: Self-pay

## 2019-03-20 ENCOUNTER — Encounter: Payer: Self-pay | Admitting: Adult Health

## 2019-03-20 DIAGNOSIS — G4719 Other hypersomnia: Secondary | ICD-10-CM | POA: Diagnosis not present

## 2019-03-20 DIAGNOSIS — Z9989 Dependence on other enabling machines and devices: Secondary | ICD-10-CM

## 2019-03-20 DIAGNOSIS — G4733 Obstructive sleep apnea (adult) (pediatric): Secondary | ICD-10-CM

## 2019-03-20 MED ORDER — MODAFINIL 100 MG PO TABS: 100.0000 mg | ORAL_TABLET | Freq: Every day | ORAL | 5 refills | Status: DC

## 2019-03-20 NOTE — Progress Notes (Signed)
PATIENT: [name] DOB: 1960-11-23  REASON FOR VISIT: follow up HISTORY FROM: patient  Virtual Visit via Video Note  I connected with [name] on 03/20/19 at  8:30 AM EDT by a video enabled telemedicine application located remotely at [facility] and verified that I am speaking with the correct person using two identifiers who was located at their own home.   I discussed the limitations of evaluation and management by telemedicine and the availability of in person appointments. The patient expressed understanding and agreed to proceed.   PATIENT: [name] DOB: [birth year]  REASON FOR VISIT: follow up HISTORY FROM: patient  HISTORY OF PRESENT ILLNESS: Today 03/20/19:  [name] is a 58 year old male with a history of hypersomnia and obstructive sleep apnea on CPAP.  His download indicates that he use his machine nightly for compliance of 100%.  He uses machine greater than 4 hours each night.  On average he uses his machine 7 hours and 6 minutes.  His residual AHI is 4 on 12 cm of water.  He reports that he continues to have daytime sleepiness.  He states that he is often dizzy he can stay awake however if he sits down to watch television he will fall asleep or continuously yawn.  The patient did have HLA blood work.  1 allele was positive and the other one was negative.  It was recommended that he do an MS LT however the patient states that he is not able to do this right now as he has children at home that he cannot leave during the day.  He does have a history of atrial fibrillation and sees Dr. Terrence Dupont.  HISTORY  [name] is a 58 y.o. male patient seen here in a RV , patient of Dr. Erlinda [name] and Ivan Man, MD. I have the pleasure of meeting [name] today on 15 December 2018 and he has become a highly compliant CPAP patient is using a C-Flex machine with a average user time of 7 hours and 5 minutes daily, 100% of the last 30 days were with device usage and  all these days with over 4 hours of daily use.  His residual AHI is 2.5 CPAP is set at 12 cmH2O with 3 cm C-Flex setting and a ramp time of 20 minutes.  He actually sleeps well and endorses that his sleep has improved but he continues to endorse the Epworth Sleepiness Scale at 15 out of 24 points and the fatigue severity at 47 out of 63 points.  These are very elevated levels.   He reports the irresistible urge to sleep, vivid dreams, no sleep paralysis, dream intrusion but no cataplexy. He starts crying when he wants to laugh.   We have to look for other reasons of hypersomnia. He is rested and restored. His diabetes and HTN have been better controlled.   He has started to exercise daily he has changed his lifestyle his current BMI is 39.99, he has physiological bradycardia - rate controlled atrial fibrillation.  He quit smoking in 2015 the year of his stroke.   REVIEW OF SYSTEMS: Out of a complete 14 system review of symptoms, the patient complains only of the following symptoms, and all other reviewed systems are negative.  See HPI  ALLERGIES: Allergies  Allergen Reactions  . Tylox [Oxycodone-Acetaminophen] Hives, Other (See Comments) and Rash    Sweating, shaking Reaction: Tremors and diaphoresis   . Adhesive [Tape] Rash  HOME MEDICATIONS: Outpatient Medications Prior to Visit  Medication Sig Dispense Refill  . albuterol (PROVENTIL HFA;VENTOLIN HFA) 108 (90 BASE) MCG/ACT inhaler Inhale 2 puffs into the lungs every 6 (six) hours as needed for wheezing or shortness of breath.     [name] amiodarone (PACERONE) 200 MG tablet Take 1 tablet (200 mg total) by mouth daily. 30 tablet 1  . apixaban (ELIQUIS) 5 MG TABS tablet Take 5 mg by mouth 2 (two) times daily.     [name] atorvastatin (LIPITOR) 20 MG tablet Take 20 mg by mouth at bedtime.     . clopidogrel (PLAVIX) 75 MG tablet Take 1 tablet (75 mg total) by mouth daily. (Patient taking differently: Take 37.5 mg by mouth daily. )    . esomeprazole  (NEXIUM) 40 MG capsule Take 40 mg by mouth daily as needed (Acid reflux).    . furosemide (LASIX) 40 MG tablet Take 40 mg by mouth daily.    [name] gabapentin (NEURONTIN) 100 MG capsule TAKE 1 CAPSULE BY MOUTH THREE TIMES DAILY 270 capsule 3  . lidocaine (XYLOCAINE) 5 % ointment Apply 1 application topically as needed for moderate pain.    . metFORMIN (GLUCOPHAGE) 500 MG tablet Take 1 tablet (500 mg total) by mouth 2 (two) times daily with a meal. 60 tablet 0  . nitroGLYCERIN (NITROSTAT) 0.4 MG SL tablet Place 1 tablet (0.4 mg total) under the tongue every 5 (five) minutes x 3 doses as needed for chest pain. 25 tablet 1  . potassium chloride (K-DUR) 10 MEQ tablet Take 10 mEq by mouth daily.    [name] umeclidinium-vilanterol (ANORO ELLIPTA) 62.5-25 MCG/INH AEPB Inhale 1 puff into the lungs daily.      No facility-administered medications prior to visit.     PAST MEDICAL HISTORY: Past Medical History:  Diagnosis Date  . A-fib ([place])   . Aneurysm ([place])    brain  . Arthritis   . Atrial fibrillation ([place])   . Bipolar disorder ([place])    was on meds but was taken off 2 yrs ago and none since  . Burning pain    in both legs-seeing Dr.Sethi for this  . CAD (coronary artery disease)   . Cataracts, bilateral   . Childhood asthma    Last asthma attack at age 25; History of trach at 16 months (12/17/2016)  . Chronic kidney disease 2009  . Chronic lower back pain   . COPD (chronic obstructive pulmonary disease) ([place]) 06/2012   uses Spiriva and Albuterol daily as needed  . Depression   . Family history of adverse reaction to anesthesia    "Mom and sister have PONV"  . GERD (gastroesophageal reflux disease)   . H/O hiatal hernia   . Headache(784.0)    "q 2-3 weeks; daily last 2 wks" (12/17/2016)  . Hyperlipidemia    takes Ramipril daily  . Hypertension    takes Ramipril daily  . Joint pain   . Joint swelling   . Middle cerebral artery aneurysm    right  . Nocturia   . Numbness    both arms   .  Pneumonia    hx of-2014  . PONV (postoperative nausea and vomiting)    Pt reports nausea only.  . Sleep apnea    dx just this past week   08/21/2017  . Stroke [facility]) 2015   "drag left foot more since; have to wear glasses now" (12/17/2016)  . Type II diabetes mellitus (HCC)    takes Metformin daily  dx 2015  . Umbilical hernia   . Urinary frequency   . Wears dentures   . Wears glasses    and contact lenses    PAST SURGICAL HISTORY: Past Surgical History:  Procedure Laterality Date  . ANEURYSM COILING  2015  . CARDIOVERSION N/A 12/18/2016   Procedure: CARDIOVERSION;  Surgeon: Dixie Dials, MD;  Location: [facility] ENDOSCOPY;  Service: Cardiovascular;  Laterality: N/A;  . COLONOSCOPY    . COLONOSCOPY N/A 04/08/2018   Procedure: COLONOSCOPY;  Surgeon: Carol Ada, MD;  Location: [facility] ENDOSCOPY;  Service: Endoscopy;  Laterality: N/A;  . ESOPHAGOGASTRODUODENOSCOPY N/A 04/08/2018   Procedure: ESOPHAGOGASTRODUODENOSCOPY (EGD);  Surgeon: Carol Ada, MD;  Location: [facility] ENDOS[facility];  Service: Endoscopy;  Laterality: N/A;  . EXCISIONAL HEMORRHOIDECTOMY  1980s   "soon after rectal OR"  . GANGLION CYST EXCISION Right   . HOT HEMOSTASIS N/A 04/08/2018   Procedure: HOT HEMOSTASIS (ARGON PLASMA COAGULATION/BICAP);  Surgeon: Carol Ada, MD;  Location: [facility] ENDOSCOPY;  Service: Endoscopy;  Laterality: N/A;  . IR 3D INDEPENDENT WKST  05/10/2017  . IR 3D INDEPENDENT WKST  06/28/2017  . IR ANGIO INTRA EXTRACRAN SEL COM CAROTID INNOMINATE BILAT MOD SED  04/22/2017  . IR ANGIO INTRA EXTRACRAN SEL COM CAROTID INNOMINATE BILAT MOD SED  11/22/2017  . IR ANGIO INTRA EXTRACRAN SEL COM CAROTID INNOMINATE BILAT MOD SED  05/13/2018  . IR ANGIO INTRA EXTRACRAN SEL INTERNAL CAROTID UNI L MOD SED  12/27/2017  . IR ANGIO INTRA EXTRACRAN SEL INTERNAL CAROTID UNI R MOD SED  05/10/2017  . IR ANGIO INTRA EXTRACRAN SEL INTERNAL CAROTID UNI R MOD SED  06/28/2017  . IR ANGIO VERTEBRAL SEL SUBCLAVIAN INNOMINATE UNI L MOD SED  04/22/2017  .  IR ANGIO VERTEBRAL SEL SUBCLAVIAN INNOMINATE UNI R MOD SED  06/28/2017  . IR ANGIO VERTEBRAL SEL SUBCLAVIAN INNOMINATE UNI R MOD SED  11/22/2017  . IR ANGIO VERTEBRAL SEL SUBCLAVIAN INNOMINATE UNI R MOD SED  05/13/2018  . IR ANGIO VERTEBRAL SEL VERTEBRAL UNI R MOD SED  04/22/2017  . IR ANGIOGRAM FOLLOW UP STUDY  05/10/2017  . IR ANGIOGRAM FOLLOW UP STUDY  06/28/2017  . IR ANGIOGRAM FOLLOW UP STUDY  12/27/2017  . IR NEURO EACH ADD'L AFTER BASIC UNI LEFT (MS)  12/27/2017  . IR NEURO EACH ADD'L AFTER BASIC UNI RIGHT (MS)  05/10/2017  . IR NEURO EACH ADD'L AFTER BASIC UNI RIGHT (MS)  06/28/2017  . IR RADIOLOGIST EVAL & MGMT  04/30/2017  . IR RADIOLOGIST EVAL & MGMT  06/15/2017  . IR RADIOLOGIST EVAL & MGMT  07/13/2017  . IR RADIOLOGIST EVAL & MGMT  01/11/2018  . IR TRANSCATH/EMBOLIZ  05/10/2017  . IR TRANSCATH/EMBOLIZ  06/28/2017  . IR TRANSCATH/EMBOLIZ  12/27/2017  . MULTIPLE TOOTH EXTRACTIONS    . POLYPECTOMY  04/08/2018   Procedure: POLYPECTOMY;  Surgeon: Carol Ada, MD;  Location: [facility] ENDOSCOPY;  Service: Endoscopy;;  . RADIOLOGY WITH ANESTHESIA N/A 08/08/2014   Procedure: RADIOLOGY WITH ANESTHESIA EMBOLIZATION;  Surgeon: Medication Radiologist, MD;  Location: [place];  Service: Radiology;  Laterality: N/A;  . RADIOLOGY WITH ANESTHESIA N/A 11/21/2014   Procedure: RADIOLOGY WITH ANESTHESIA;  Surgeon: Rob Hickman, MD;  Location: [place];  Service: Radiology;  Laterality: N/A;  . RADIOLOGY WITH ANESTHESIA N/A 02/27/2015   Procedure: RADIOLOGY WITH ANESTHESIA;  Surgeon: Luanne Bras, MD;  Location: [place];  Service: Radiology;  Laterality: N/A;  . RADIOLOGY WITH ANESTHESIA N/A 09/25/2015   Procedure: RADIOLOGY WITH ANESTHESIA;  Surgeon: Luanne Bras, MD;  Location: [place];  Service: Radiology;  Laterality: N/A;  . RADIOLOGY WITH ANESTHESIA N/A 05/10/2017   Procedure: EMBOLIZATION;  Surgeon: Luanne Bras, MD;  Location: [place];  Service: Radiology;  Laterality: N/A;  . RADIOLOGY WITH ANESTHESIA  N/A 06/28/2017   Procedure: RADIOLOGY WITH ANESTHESIA-EMBOLIZATION;  Surgeon: Luanne Bras, MD;  Location: [place];  Service: Radiology;  Laterality: N/A;  . RADIOLOGY WITH ANESTHESIA N/A 08/26/2017   Procedure: RADIOLOGY WITH ANESTHESIA EMBOLIZATION;  Surgeon: Luanne Bras, MD;  Location: [place];  Service: Radiology;  Laterality: N/A;  . RADIOLOGY WITH ANESTHESIA N/A 11/22/2017   Procedure: EMBOLIZATION;  Surgeon: Luanne Bras, MD;  Location: [place];  Service: Radiology;  Laterality: N/A;  . RADIOLOGY WITH ANESTHESIA N/A 12/27/2017   Procedure: EMBOLIZATION;  Surgeon: Luanne Bras, MD;  Location: [place];  Service: Radiology;  Laterality: N/A;  . RECTAL SURGERY  1980s   "tore it lifting heavy furniture; sewed it back together"  . SHOULDER ARTHROSCOPY WITH OPEN ROTATOR CUFF REPAIR Right 2016  . SHOULDER ARTHROSCOPY WITH ROTATOR CUFF REPAIR Left 1996  . SHOULDER OPEN ROTATOR CUFF REPAIR Left 1997   "took out 8inches of my collarbone"  . TEE WITHOUT CARDIOVERSION N/A 12/18/2016   Procedure: TRANSESOPHAGEAL ECHOCARDIOGRAM (TEE);  Surgeon: Dixie Dials, MD;  Location: [facility] ENDOSCOPY;  Service: Cardiovascular;  Laterality: N/A;  . TRACHEOSTOMY  1964   at age 38 months old; for asthma  . TRACHEOSTOMY CLOSURE      FAMILY HISTORY: Family History  Problem Relation Age of Onset  . Heart disease Mother        s/p 3V CABG  . Cancer Father 40       lung cancer; +tobacco  . Stroke Father   . Cancer Maternal Grandmother   . Heart disease Maternal Grandmother   . Cancer Paternal Grandmother   . Lupus Sister   . Multiple sclerosis Sister   . Anemia Daughter     SOCIAL HISTORY: Social History   Socioeconomic History  . Marital status: Divorced    Spouse name: not together since 2007  . Number of children: 3  . Years of education: 10  . Highest education level: Not on file  Occupational History  . 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 Financial resource strain: Not on file  . Food insecurity:    Worry: Not on file    Inability: Not on file  . Transportation needs:    Medical: Not on file    Non-medical: Not on file  Tobacco Use  . Smoking status: Former Smoker    Packs/day: 1.50    Years: 25.00    Pack years: 37.50    Types: Cigarettes    Start date: 01/15/1975    Last attempt to quit: 06/02/2014    Years since quitting: 4.8  . Smokeless tobacco: Never Used  Substance and Sexual Activity  . Alcohol use: Yes    Alcohol/week: 3.0 standard drinks    Types: 3 Cans of beer per week    Comment: drinks 3 - 12oz bottles every monday, during shooting pool  . Drug use: No  . Sexual activity: Never    Partners: Female    Comment: partner has had surgery to prevent pregnancy  Lifestyle  . Physical activity:    Days per week: Not on file    Minutes per session: Not on file  . Stress: Not on file  Relationships  .  Social connections:    Talks on phone: Not on file    Gets together: Not on file    Attends religious service: Not on file    Active member of club or organization: Not on file    Attends meetings of clubs or organizations: Not on file    Relationship status: Not on file  . Intimate partner violence:    Fear of current or ex partner: Not on file    Emotionally abused: Not on file    Physically abused: Not on file    Forced sexual activity: Not on file  Other Topics Concern  . Not on file  Social History Narrative   Lives with his son and his mother.  His son has no contact with the son's mother, though the patient is not legally separated from her.  She has a history of drug use and has been in prison several times.      PHYSICAL EXAM Generalized: Well developed, in no acute distress   Neurological examination  Mentation: Alert oriented to time, place, history taking. Follows all commands speech and language fluent Cranial nerve II-XII:Extraocular movements were full. Facial  symmetry noted. uvula tongue midline. Head turning and shoulder shrug  were normal and symmetric. Motor: Good strength throughout subjectively per patient Sensory: Sensory testing is intact to soft touch on all 4 extremities subjectively per patient Reflexes: UTA  DIAGNOSTIC DATA (LABS, IMAGING, TESTING) - I reviewed patient records, labs, notes, testing and imaging myself where available.  Lab Results  Component Value Date   WBC 4.9 05/13/2018   HGB 12.9 (L) 05/13/2018   HCT 40.0 05/13/2018   MCV 90.9 05/13/2018   PLT 215 05/13/2018      Component Value Date/Time   NA 141 05/13/2018 0700   K 4.1 05/13/2018 0700   CL 102 05/13/2018 0700   CO2 29 05/13/2018 0700   GLUCOSE 109 (H) 05/13/2018 0700   BUN 12 05/13/2018 0700   CREATININE 1.06 05/13/2018 0700   CALCIUM 9.2 05/13/2018 0700   PROT 6.4 (L) 12/27/2017 0620   ALBUMIN 3.7 12/27/2017 0620   AST 17 12/27/2017 0620   ALT 17 12/27/2017 0620   ALKPHOS 53 12/27/2017 0620   BILITOT 0.6 12/27/2017 0620   GFRNONAA >60 05/13/2018 0700   GFRAA >60 05/13/2018 0700   Lab Results  Component Value Date   CHOL 125 10/27/2017   HDL 38 (L) 10/27/2017   LDLCALC 76 10/27/2017   TRIG 57 10/27/2017   CHOLHDL 3.3 10/27/2017   Lab Results  Component Value Date   HGBA1C 5.2 10/27/2017   No results found for: VITAMINB12 Lab Results  Component Value Date   TSH 1.352 10/27/2017      ASSESSMENT AND PLAN 58 y.o. year old male  has a past medical history of A-fib ([place]), Aneurysm ([place]), Arthritis, Atrial fibrillation ([place]), Bipolar disorder ([place]), Burning pain, CAD (coronary artery disease), Cataracts, bilateral, Childhood asthma, Chronic kidney disease (2009), Chronic lower back pain, COPD (chronic obstructive pulmonary disease) ([place]) (06/2012), Depression, Family history of adverse reaction to anesthesia, GERD (gastroesophageal reflux disease), H/O hiatal hernia, Headache(784.0), Hyperlipidemia, Hypertension, Joint pain, Joint swelling,  Middle cerebral artery aneurysm, Nocturia, Numbness, Pneumonia, PONV (postoperative nausea and vomiting), Sleep apnea, Stroke ([place]) (2015), Type II diabetes mellitus ([place]), Umbilical hernia, Urinary frequency, Wears dentures, and Wears glasses. here with:  1.  Obstructive sleep apnea on CPAP 2.  Excessive daytime sleepiness  Overall the patient has done well with CPAP therapy.  He has  excellent compliance and good treatment of his apnea.  The patient continues to have issues with daytime sleepiness.  He is unable to do a MSL T currently.  I will order modafinil 100 mg daily.  I have advised that he will need to monitor his heart rate as it can increase his heart rate.  Also encouraged the patient to let Dr. Terrence Dupont know that this is been ordered before he fills his prescription.  If Dr. Terrence Dupont has any objections the patient can let me know.  The patient will follow-up in 6 months or sooner if needed.   I spent 15 minutes with the patient this time was spent reviewing the patient's CPAP download, discussing hypersomnia and medication options.   Ward Givens, MSN, NP-C 03/20/2019, 8:27 AM [facility] [address] [place], [place] [address] [phone]

## 2019-05-03 IMAGING — CT CT ANGIO HEAD
1 of 12 series · 5 of 33 positions shown · IV contrast (isovue)
Comparison: CT head without contrast 09/21/2016. Cerebral
arteriogram 09/25/2015. Cerebral arteriogram 02/27/2015.

CLINICAL DATA: Episode of slurred speech and headaches. Personal
history of left MCA aneurysm coiled 7228 and right carotid stent
6725.

EXAM:
CT ANGIOGRAPHY HEAD AND NECK
TECHNIQUE: Multidetector CT imaging of the head and neck was performed using
the standard protocol during bolus administration of intravenous
contrast. Multiplanar CT image reconstructions and MIPs were
obtained to evaluate the vascular anatomy. Carotid stenosis
measurements (when applicable) are obtained utilizing NASCET
criteria, using the distal internal carotid diameter as the
denominator.
CONTRAST:  50 mL Isovue 370

[Series 11: cta neck axial · axial · 0.39mm/px · z∈[-251,-11]mm · 5 of 360 slices shown]
[im 60/360  soft-tissue]
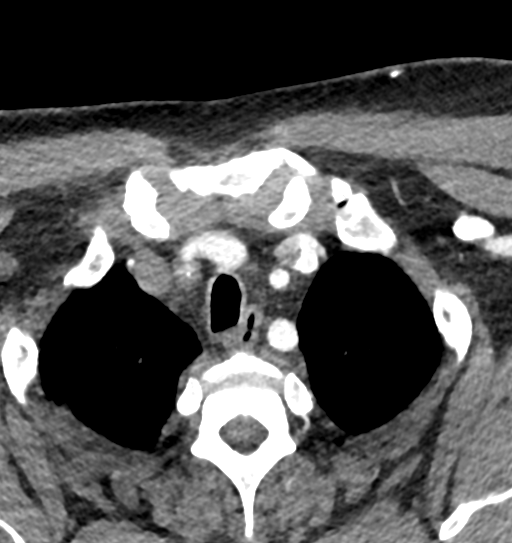
[im 120/360  bone]
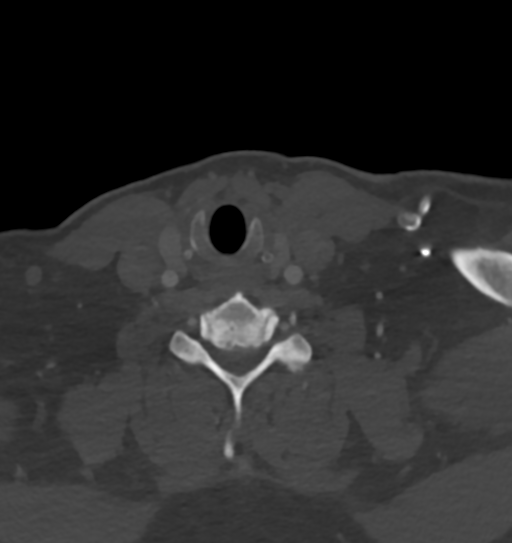
[im 180/360  soft-tissue]
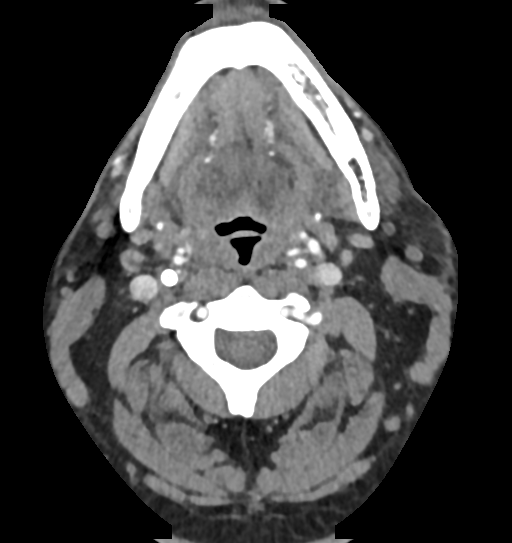
[im 240/360  bone]
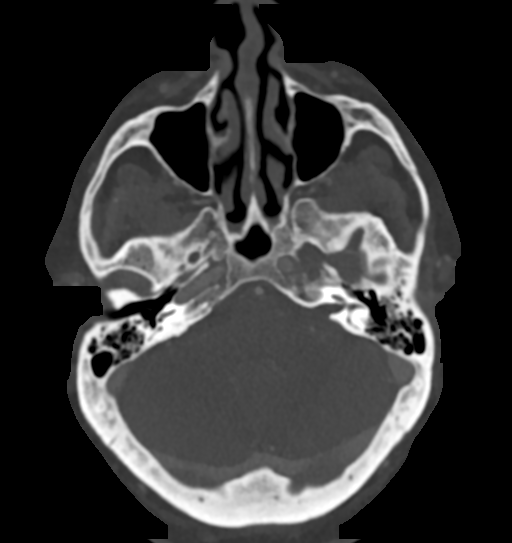
[im 300/360  soft-tissue]
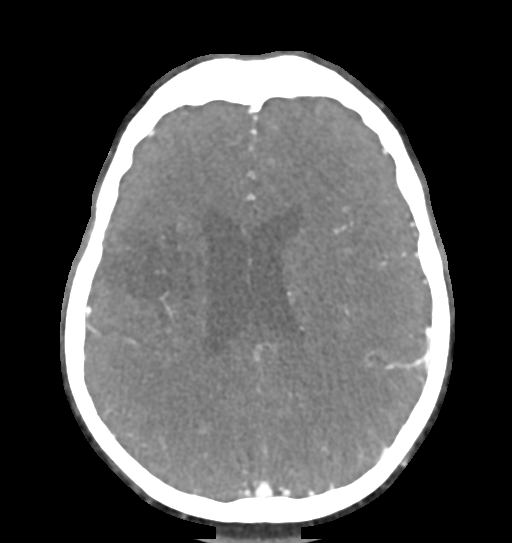

[5 of 33 positions shown; findings below may reference images not displayed]

FINDINGS: CT HEAD FINDINGS

Brain: A remote right frontal lobe a particular infarct is stable.
There is associated ex vacuo dilation of the right lateral
ventricle. Infarct involves the lentiform nucleus. No new infarct is
present. The basal ganglia are otherwise intact. No other acute or
focal cortical lesion is present. The brainstem and cerebellum are
normal.

Vascular: A left MCA bifurcation stent is present. Previously coiled
left MCA bifurcation aneurysm is noted. No hyperdense vessel is
present.

Skull: The calvarium is intact. No acute or healing fracture is
present. No focal lytic or blastic lesion is present.

Sinuses: The paranasal sinuses and mastoid air cells are clear.

Orbits: The globes and orbits are within normal limits.

Review of the MIP images confirms the above findings

CTA NECK FINDINGS

Aortic arch: A 3 vessel arch configuration is present. There is
minimal calcification along the undersurface of the aorta without
aneurysm. Great vessel origins are within normal limits.

Right carotid system: The right common carotid artery is within
normal limits. Right carotid bifurcation stent is in place. The
stent is patent. The post stent right internal carotid artery is
within normal limits.

Left carotid system: The left common carotid artery is within normal
limits. Calcified and noncalcified atherosclerotic disease is
evident at the proximal left internal carotid artery without
significant luminal narrowing relative to the more distal vessel.

Vertebral arteries: The vertebral arteries originate from the
subclavian arteries bilaterally. The vertebral arteries are
codominant. There is no focal stenosis or vascular injury to either
vertebral artery in the neck.

Skeleton: Mild endplate changes and uncovertebral spurring are
present in the cervical spine. This is most significant at C5-6 with
right greater than left osseous foraminal narrowing. The patient is
edentulous. No focal lytic or blastic lesions are present. The
skullbase is within normal limits.

Other neck: The soft tissues of the neck are otherwise unremarkable.
The thyroid is within normal limits. The larynx and pharynx are
unremarkable. The salivary glands are within normal limits. No
significant adenopathy is present.

Upper chest: The lung apices are clear. The upper mediastinum is
within normal limits.

Review of the MIP images confirms the above findings

CTA HEAD FINDINGS

Anterior circulation: Approximately 50% narrowing is present in the
cavernous right internal carotid artery relative to the ICA
terminus. There is more robust flow in the left internal carotid
artery. A 2.5 mm posterior left A1 segment aneurysm is stable. The
A1 segments are otherwise normal. The anterior communicating artery
is patent. The right M1 segment is normal. A stent in the left M1
segment terminating in an M2 branch is patent. Coil pack is noted
within the left MCA bifurcation aneurysm without evidence for
residual or recurrent aneurysm. The right MCA bifurcation aneurysm
is similar in size measuring 5 x 4 x 3 mm. Distal branch vessels
opacify on both sides. There is a relative paucity of vessels
extending to the infarcted territory.

Posterior circulation: The vertebral arteries are codominant. PICA
origins are visualized and normal. Both posterior cerebral arteries
originate from the basilar tip. The PCA branch vessels are within
normal limits without a significant proximal stenosis or occlusion.
There is some attenuation of distal PCA branch vessels bilaterally.

Venous sinuses: The dural sinuses are patent. Transverse sinuses are
codominant. The straight sinus and deep cerebral veins are intact.
Cortical veins are unremarkable.

Anatomic variants: None

Delayed phase: The postcontrast images demonstrate no pathologic
enhancement.

Review of the MIP images confirms the above findings
IMPRESSION: 1. The right carotid bifurcation stent is patent.
2. Slight decreased size of the cavernous internal carotid artery on
the left may reflect some flow limitation on the right.
3. Left carotid bifurcation disease with less than 50% stenosis
relative to the more distal vessel.
4. Left M1 segment and MCA bifurcation stent and aneurysm coils
without evidence for residual or recurrent aneurysm or distal
occlusion.
5. Stable mm right carotid bifurcation aneurysm.
6. Stable 2.5 mm left A1 segment aneurysm.

## 2019-06-01 ENCOUNTER — Other Ambulatory Visit (HOSPITAL_COMMUNITY): Payer: Self-pay | Admitting: Interventional Radiology

## 2019-06-01 DIAGNOSIS — I729 Aneurysm of unspecified site: Secondary | ICD-10-CM

## 2019-06-14 ENCOUNTER — Ambulatory Visit (HOSPITAL_COMMUNITY)
Admission: RE | Admit: 2019-06-14 | Discharge: 2019-06-14 | Disposition: A | Discharge status: Home / Self Care | Payer: Medicare Other | Source: Ambulatory Visit | Attending: Interventional Radiology | Admitting: Interventional Radiology

## 2019-06-14 ENCOUNTER — Other Ambulatory Visit: Payer: Self-pay

## 2019-06-14 ENCOUNTER — Ambulatory Visit (HOSPITAL_COMMUNITY): Payer: Medicare Other

## 2019-06-14 DIAGNOSIS — I729 Aneurysm of unspecified site: Secondary | ICD-10-CM | POA: Diagnosis present

## 2019-06-14 LAB — POCT I-STAT CREATININE: Creatinine, Ser: 1.2 mg/dL (ref 0.61–1.24)

## 2019-06-14 MED ORDER — IOHEXOL 350 MG/ML SOLN
75.0000 mL | Freq: Once | INTRAVENOUS | Status: AC | PRN
  Administered 2019-06-14: 75 mL via INTRAVENOUS

## 2019-06-20 ENCOUNTER — Telehealth (HOSPITAL_COMMUNITY): Payer: Self-pay

## 2019-06-20 NOTE — Telephone Encounter (Signed)
Pt agreed to f/u in 6 months with cta head/neck. AW 

## 2019-09-20 ENCOUNTER — Ambulatory Visit: Payer: Medicare Other | Admitting: Adult Health

## 2019-11-27 ENCOUNTER — Ambulatory Visit: Payer: Medicare Other | Attending: Internal Medicine

## 2019-11-27 DIAGNOSIS — Z23 Encounter for immunization: Secondary | ICD-10-CM | POA: Insufficient documentation

## 2019-11-27 NOTE — Progress Notes (Signed)
   Covid-19 Vaccination Clinic  Name:  [name]    MRN: [id number] DOB: [birth year]  11/27/2019  [name] was observed post Covid-19 immunization for 15 minutes without incidence. He was provided with Vaccine Information Sheet and instruction to access the V-Safe system.   Mr. Even was instructed to call 911 with any severe reactions post vaccine: [name] Difficulty breathing  . Swelling of your face and throat  . A fast heartbeat  . A bad rash all over your body  . Dizziness and weakness    Immunizations Administered    Name Date Dose VIS Date Route   Pfizer COVID-19 Vaccine 11/27/2019  7:20 PM 0.3 mL 09/22/2019 Intramuscular   Manufacturer: [employer], [employer]   Lot: [id number]   NDC: [address]-9

## 2019-12-19 ENCOUNTER — Ambulatory Visit: Payer: Medicare Other | Attending: Internal Medicine

## 2019-12-19 DIAGNOSIS — Z23 Encounter for immunization: Secondary | ICD-10-CM | POA: Insufficient documentation

## 2019-12-19 NOTE — Progress Notes (Signed)
   Covid-19 Vaccination Clinic  Name:  [name]    MRN: [id number] DOB: 09-04-1961  12/19/2019  [name] was observed post Covid-19 immunization for 15 minutes without incident. He was provided with Vaccine Information Sheet and instruction to access the V-Safe system.   [name] was instructed to call 911 with any severe reactions post vaccine: [name] Difficulty breathing  . Swelling of face and throat  . A fast heartbeat  . A bad rash all over body  . Dizziness and weakness   Immunizations Administered    Name Date Dose VIS Date Route   Pfizer COVID-19 Vaccine 12/19/2019 11:26 AM 0.3 mL 09/22/2019 Intramuscular   Manufacturer: [employer], [employer]   Lot: [id number]   NDC: [address]-6

## 2019-12-20 ENCOUNTER — Ambulatory Visit: Payer: Medicare Other

## 2020-01-17 ENCOUNTER — Other Ambulatory Visit (HOSPITAL_COMMUNITY): Payer: Self-pay | Admitting: Interventional Radiology

## 2020-01-17 DIAGNOSIS — I671 Cerebral aneurysm, nonruptured: Secondary | ICD-10-CM

## 2020-01-26 ENCOUNTER — Ambulatory Visit (HOSPITAL_COMMUNITY)
Admission: RE | Admit: 2020-01-26 | Discharge: 2020-01-26 | Disposition: A | Discharge status: Home / Self Care | Payer: Medicare Other | Source: Ambulatory Visit | Attending: Interventional Radiology | Admitting: Interventional Radiology

## 2020-01-26 ENCOUNTER — Other Ambulatory Visit: Payer: Self-pay

## 2020-01-26 DIAGNOSIS — I671 Cerebral aneurysm, nonruptured: Secondary | ICD-10-CM

## 2020-01-26 LAB — POCT I-STAT CREATININE: Creatinine, Ser: 0.9 mg/dL (ref 0.61–1.24)

## 2020-01-26 MED ORDER — IOHEXOL 350 MG/ML SOLN
75.0000 mL | Freq: Once | INTRAVENOUS | Status: AC | PRN
  Administered 2020-01-26: 11:00:00 125 mL via INTRAVENOUS

## 2020-01-30 ENCOUNTER — Telehealth (HOSPITAL_COMMUNITY): Payer: Self-pay

## 2020-01-30 NOTE — Telephone Encounter (Signed)
Pt agreed to f/u in 1 year with cta head/neck. AW  

## 2020-09-16 ENCOUNTER — Ambulatory Visit: Payer: Self-pay | Admitting: Surgery

## 2020-09-16 NOTE — H&P (Signed)
[name] Appointment: 09/16/2020 4:00 PM Location: [facility] Patient #: [id number] DOB: [birth year] Divorced / Language: [place] / Race: White Male  History of Present Illness Ivan Sportsman MD; 09/16/2020 5:35 PM) The patient is a 59 year old male who presents with an abdominal wall hernia. Note for "Abdominal hernia": ` ` ` Patient sent for surgical consultation at the request of Ivan Hawking, MD  Chief Complaint: Abdominal hernia ` ` The patient is a patient with numerous health issues. History of brain aneurysms and coronary disease. Status post coiling's. Followed by cardiology. Totally anticoagulated on Plavix and Eliquis. Diabetes. Former smoker. Sleep apnea on CPAP. Patient's eyes had some upper abdominal bulging but he noticed more pain and swelling at his belly button this past year. More intense the past couple months. Was concerned. Sent to gastrology for pain. GI workup including endoscopy rather underwhelming. Abdominal pain suspected related to hernia. He gets worsening pain with eating and activity. Surgical consultation recommended. Patient claims he can walk 20-30 minutes before he has to stop. Dr. Christen Bame is his cardiologist. No prior history of abdominal surgery  No personal nor family history of GI/colon cancer, inflammatory bowel disease, irritable bowel syndrome, allergy such as Celiac Sprue, dietary/dairy problems, colitis, ulcers nor gastritis. No recent sick contacts/gastroenteritis. No travel outside the country. No changes in diet. No dysphagia to solids or liquids. No significant heartburn or reflux. No melena, hematemesis, coffee ground emesis. No evidence of prior gastric/peptic ulceration.  (Review of systems as stated in this history (HPI) or in the review of systems. Otherwise all other 12 point ROS are negative) ` ` ###########################################`  This patient encounter took 45 minutes today to perform  the following: obtain history, perform exam, review outside records, interpret tests & imaging, counsel the patient on their diagnosis; and, document this encounter, including findings & plan in the electronic health record (EHR).   Past Surgical History Jaynee Eagles, [place]; 09/16/2020 4:17 PM) Aneurysm Repair Colon Polyp Removal - Colonoscopy Foot Surgery Left. Oral Surgery Resection of Small Bowel Shoulder Surgery Bilateral.  Diagnostic Studies History Jaynee Eagles, CMA; 09/16/2020 4:17 PM) Colonoscopy within last year  Allergies Elease Hashimoto Spillers, CMA; 09/16/2020 4:20 PM) Tylox *ANALGESICS - OPIOID* Adhesive Tape *MEDICAL DEVICES AND SUPPLIES*  Medication History (Alisha Spillers, CMA; 09/16/2020 4:22 PM) Eliquis (5MG  Tablet, Oral) Active. Clopidogrel Bisulfate (75MG  Tablet, Oral) Active. Furosemide (40MG  Tablet, Oral) Active. Klor-Con M10 ( Tablet ER, Oral) Active. metFORMIN HCl (500MG  Tablet, Oral) Active. Atorvastatin Calcium (20MG  Tablet, Oral) Active. Nitroglycerin (0.4MG  Tab Sublingual, Sublingual) Active. Amiodarone HCl (200MG  Tablet, Oral) Active. Medications Reconciled  Social History , ; 09/16/2020 4:17 PM) Alcohol use Moderate alcohol use. Caffeine use Tea. Illicit drug use Remotely quit drug use. Tobacco use Former smoker.  Family History , ; 09/16/2020 4:17 PM) Bleeding disorder Daughter. Cancer Father, Mother. Diabetes Mellitus Family Members In General. Heart Disease Mother. Hypertension Mother. Kidney Disease Father, Mother. Respiratory Condition Mother.  Other Problems Jaynee Eagles, [place]; 09/16/2020 4:17 PM) Asthma Atrial Fibrillation Cerebrovascular Accident Chronic Obstructive Lung Disease Diabetes Mellitus Gastroesophageal Reflux Disease Hypercholesterolemia Inguinal Hernia Sleep Apnea Umbilical Hernia Repair     Review of Systems Jaynee Eagles CMA;  09/16/2020 4:17 PM) General Present- Weight Gain. Not Present- Appetite Loss, Chills, Fatigue, Fever, Night Sweats and Weight Loss. Skin Present- Dryness. Not Present- Change in Wart/Mole, Hives, Jaundice, New Lesions, Non-Healing Wounds, Rash and Ulcer. HEENT Present- Ringing in the Ears and Wears glasses/contact lenses. Not Present- Earache, Hearing Loss, Hoarseness,  Nose Bleed, Oral Ulcers, Seasonal Allergies, Sinus Pain, Sore Throat, Visual Disturbances and Yellow Eyes. Respiratory Present- Wheezing. Not Present- Bloody sputum, Chronic Cough, Difficulty Breathing and Snoring. Breast Not Present- Breast Mass, Breast Pain, Nipple Discharge and Skin Changes. Cardiovascular Present- Rapid Heart Rate, Shortness of Breath and Swelling of Extremities. Not Present- Chest Pain, Difficulty Breathing Lying Down, Leg Cramps and Palpitations. Gastrointestinal Present- Abdominal Pain, Bloating, Bloody Stool, Change in Bowel Habits, Constipation, Excessive gas and Hemorrhoids. Not Present- Chronic diarrhea, Difficulty Swallowing, Gets full quickly at meals, Indigestion, Nausea, Rectal Pain and Vomiting. Male Genitourinary Present- Change in Urinary Stream, Urgency and Urine Leakage. Not Present- Blood in Urine, Frequency, Impotence, Nocturia and Painful Urination.  Vitals (Alisha Spillers CMA; 09/16/2020 4:19 PM) 09/16/2020 4:18 PM Weight: 255.4 lb Height: 68in Body Surface Area: 2.27 m Body Mass Index: 38.83 kg/m  Temp.: 98.58F(Oral)  Pulse: 64 (Regular)  BP: 112/78(Sitting, Left Arm, Standard)        Physical Exam Ivan Sportsman MD; 09/16/2020 4:35 PM)  Abdomen Note: Apple core body habitus. Thinwall. 3 cm periumbilical region sensitive that reduces down to smaller fascial defects. Large super umbilical diastases recti. No hepatosplenomegaly  Male Genitourinary Note: No inguinal hernias. Normal external genitalia. Epididymi, testes, and spermatic cords normal without any  masses.    Assessment & Plan Ivan Sportsman MD; 09/16/2020 5:31 PM)  Dorethea Clan UMBILICAL HERNIA (K42.0) Impression: Incarcerated periumbilical hernia sensitive. I think he has more than one defect.  I think he would benefit from hernia repair since it is increasing in size now incarcerated and is uncomfortable. Would do underlay mesh repair given his obesity and size to minimize recurrence. Also need to make sure patient come off all anticoagulation since he seems to be doubly anticoagulated on Eliquis and Plavix. Make sure okay with his cardiologist  Current Plans [facility] Consent - Hernia Repair - Ventral/Incisional/Umbilical (Aariona Momon): discussed with patient and provided information. Written instructions provided  DIASTASIS RECTI (M62.08)  Current Plans Pt Education - [facility] Diastasis Recti: discussed with patient and provided information.  PREOP - VWH - ENCOUNTER FOR PREOPERATIVE EXAMINATION FOR GENERAL SURGICAL PROCEDURE (Z01.818)  Current Plans You are being scheduled for surgery- Our schedulers will call you.  You should hear from our office's scheduling department within 5 working days about the location, date, and time of surgery. We try to make accommodations for patient's preferences in scheduling surgery, but sometimes the OR schedule or the surgeon's schedule prevents [place] from making those accommodations.  If you have not heard from our office [phone]) in 5 working days, call the office and ask for your surgeon's nurse.  If you have other questions about your diagnosis, plan, or surgery, call the office and ask for your surgeon's nurse.  Pt Education - [facility] Hernia Post-Op HCI (Matilyn Fehrman): discussed with patient and provided information. Pt Education - [facility] Pain Control (Fatiha Guzy) Pt Education - Pamphlet Given - Laparoscopic Hernia Repair: discussed with patient and provided information. Pt Education - [facility] Mesh education: discussed with patient and provided  information.  BRAIN ANEURYSM (I67.1) Impression: History of brain aneurysms requiring coiling. Fully anticoagulated. Ideally patient needs to be off all anticoagulation perioperatively.   ANTICOAGULATED (Z79.01)  Current Plans I recommended obtaining preoperative cardiac clearance. I am concerned about the health of the patient and the ability to tolerate the operation. Therefore, we will request clearance by cardiology to better assess operative risk & see if a reevaluation, further workup, etc is needed. Also recommendations on how medications such as  for anticoagulation and blood pressure should be managed/held/restarted after surgery. Pt Education - [facility] Hold anticoagulation preoperatively  BODY MASS INDEX [BMI] 30.0-30.9, ADULT (Z68.30) Impression: Central morbid obesity. Makes risk for recurrence higher. Therefore plan mesh underlay repair laparoscopically.  Ivan Sportsman, MD, FACS, MASCRS Gastrointestinal and Minimally Invasive Surgery  [facility] [place] [facility] [address] [place], [place] [address] [phone] Fax [phone] Main/Paging  CONTACT INFORMATION: Weekday (9AM-5PM) concerns: Call [facility] main office at [phone] Weeknight (5PM-9AM) or Weekend/Holiday concerns: Check [email or web address] for General Surgery [facility] coverage (Please, do not use SecureChat as it is not reliable communication to operating surgeons for immediate patient care)

## 2020-09-16 NOTE — H&P (View-Only) (Signed)
[name] Appointment: 09/16/2020 4:00 PM Location: [facility] Patient #: [id number] DOB: [birth year] Divorced / Language: [place] / Race: White Male  History of Present Illness Ardeth Sportsman MD; 09/16/2020 5:35 PM) The patient is a 59 year old male who presents with an abdominal wall hernia. Note for "Abdominal hernia": ` ` ` Patient sent for surgical consultation at the request of Jeani Hawking, MD  Chief Complaint: Abdominal hernia ` ` The patient is a patient with numerous health issues. History of brain aneurysms and coronary disease. Status post coiling's. Followed by cardiology. Totally anticoagulated on Plavix and Eliquis. Diabetes. Former smoker. Sleep apnea on CPAP. Patient's eyes had some upper abdominal bulging but he noticed more pain and swelling at his belly button this past year. More intense the past couple months. Was concerned. Sent to gastrology for pain. GI workup including endoscopy rather underwhelming. Abdominal pain suspected related to hernia. He gets worsening pain with eating and activity. Surgical consultation recommended. Patient claims he can walk 20-30 minutes before he has to stop. Dr. Christen Bame is his cardiologist. No prior history of abdominal surgery  No personal nor family history of GI/colon cancer, inflammatory bowel disease, irritable bowel syndrome, allergy such as Celiac Sprue, dietary/dairy problems, colitis, ulcers nor gastritis. No recent sick contacts/gastroenteritis. No travel outside the country. No changes in diet. No dysphagia to solids or liquids. No significant heartburn or reflux. No melena, hematemesis, coffee ground emesis. No evidence of prior gastric/peptic ulceration.  (Review of systems as stated in this history (HPI) or in the review of systems. Otherwise all other 12 point ROS are negative) ` ` ###########################################`  This patient encounter took 45 minutes today to perform  the following: obtain history, perform exam, review outside records, interpret tests & imaging, counsel the patient on their diagnosis; and, document this encounter, including findings & plan in the electronic health record (EHR).   Past Surgical History Jaynee Eagles, [place]; 09/16/2020 4:17 PM) Aneurysm Repair Colon Polyp Removal - Colonoscopy Foot Surgery Left. Oral Surgery Resection of Small Bowel Shoulder Surgery Bilateral.  Diagnostic Studies History Jaynee Eagles, CMA; 09/16/2020 4:17 PM) Colonoscopy within last year  Allergies Elease Hashimoto Spillers, CMA; 09/16/2020 4:20 PM) Tylox *ANALGESICS - OPIOID* Adhesive Tape *MEDICAL DEVICES AND SUPPLIES*  Medication History (Alisha Spillers, CMA; 09/16/2020 4:22 PM) Eliquis (5MG  Tablet, Oral) Active. Clopidogrel Bisulfate (75MG  Tablet, Oral) Active. Furosemide (40MG  Tablet, Oral) Active. Klor-Con M10 ( Tablet ER, Oral) Active. metFORMIN HCl (500MG  Tablet, Oral) Active. Atorvastatin Calcium (20MG  Tablet, Oral) Active. Nitroglycerin (0.4MG  Tab Sublingual, Sublingual) Active. Amiodarone HCl (200MG  Tablet, Oral) Active. Medications Reconciled  Social History , ; 09/16/2020 4:17 PM) Alcohol use Moderate alcohol use. Caffeine use Tea. Illicit drug use Remotely quit drug use. Tobacco use Former smoker.  Family History , ; 09/16/2020 4:17 PM) Bleeding disorder Daughter. Cancer Father, Mother. Diabetes Mellitus Family Members In General. Heart Disease Mother. Hypertension Mother. Kidney Disease Father, Mother. Respiratory Condition Mother.  Other Problems Jaynee Eagles, [place]; 09/16/2020 4:17 PM) Asthma Atrial Fibrillation Cerebrovascular Accident Chronic Obstructive Lung Disease Diabetes Mellitus Gastroesophageal Reflux Disease Hypercholesterolemia Inguinal Hernia Sleep Apnea Umbilical Hernia Repair     Review of Systems Jaynee Eagles CMA;  09/16/2020 4:17 PM) General Present- Weight Gain. Not Present- Appetite Loss, Chills, Fatigue, Fever, Night Sweats and Weight Loss. Skin Present- Dryness. Not Present- Change in Wart/Mole, Hives, Jaundice, New Lesions, Non-Healing Wounds, Rash and Ulcer. HEENT Present- Ringing in the Ears and Wears glasses/contact lenses. Not Present- Earache, Hearing Loss, Hoarseness,  Nose Bleed, Oral Ulcers, Seasonal Allergies, Sinus Pain, Sore Throat, Visual Disturbances and Yellow Eyes. Respiratory Present- Wheezing. Not Present- Bloody sputum, Chronic Cough, Difficulty Breathing and Snoring. Breast Not Present- Breast Mass, Breast Pain, Nipple Discharge and Skin Changes. Cardiovascular Present- Rapid Heart Rate, Shortness of Breath and Swelling of Extremities. Not Present- Chest Pain, Difficulty Breathing Lying Down, Leg Cramps and Palpitations. Gastrointestinal Present- Abdominal Pain, Bloating, Bloody Stool, Change in Bowel Habits, Constipation, Excessive gas and Hemorrhoids. Not Present- Chronic diarrhea, Difficulty Swallowing, Gets full quickly at meals, Indigestion, Nausea, Rectal Pain and Vomiting. Male Genitourinary Present- Change in Urinary Stream, Urgency and Urine Leakage. Not Present- Blood in Urine, Frequency, Impotence, Nocturia and Painful Urination.  Vitals (Alisha Spillers CMA; 09/16/2020 4:19 PM) 09/16/2020 4:18 PM Weight: 255.4 lb Height: 68in Body Surface Area: 2.27 m Body Mass Index: 38.83 kg/m  Temp.: 98.58F(Oral)  Pulse: 64 (Regular)  BP: 112/78(Sitting, Left Arm, Standard)        Physical Exam Ardeth Sportsman MD; 09/16/2020 4:35 PM)  Abdomen Note: Apple core body habitus. Thinwall. 3 cm periumbilical region sensitive that reduces down to smaller fascial defects. Large super umbilical diastases recti. No hepatosplenomegaly  Male Genitourinary Note: No inguinal hernias. Normal external genitalia. Epididymi, testes, and spermatic cords normal without any  masses.    Assessment & Plan Ardeth Sportsman MD; 09/16/2020 5:31 PM)  Dorethea Clan UMBILICAL HERNIA (K42.0) Impression: Incarcerated periumbilical hernia sensitive. I think he has more than one defect.  I think he would benefit from hernia repair since it is increasing in size now incarcerated and is uncomfortable. Would do underlay mesh repair given his obesity and size to minimize recurrence. Also need to make sure patient come off all anticoagulation since he seems to be doubly anticoagulated on Eliquis and Plavix. Make sure okay with his cardiologist  Current Plans [facility] Consent - Hernia Repair - Ventral/Incisional/Umbilical (Donne Robillard): discussed with patient and provided information. Written instructions provided  DIASTASIS RECTI (M62.08)  Current Plans Pt Education - [facility] Diastasis Recti: discussed with patient and provided information.  PREOP - VWH - ENCOUNTER FOR PREOPERATIVE EXAMINATION FOR GENERAL SURGICAL PROCEDURE (Z01.818)  Current Plans You are being scheduled for surgery- Our schedulers will call you.  You should hear from our office's scheduling department within 5 working days about the location, date, and time of surgery. We try to make accommodations for patient's preferences in scheduling surgery, but sometimes the OR schedule or the surgeon's schedule prevents [place] from making those accommodations.  If you have not heard from our office [phone]) in 5 working days, call the office and ask for your surgeon's nurse.  If you have other questions about your diagnosis, plan, or surgery, call the office and ask for your surgeon's nurse.  Pt Education - [facility] Hernia Post-Op HCI (Aspyn Warnke): discussed with patient and provided information. Pt Education - [facility] Pain Control (Tonye Tancredi) Pt Education - Pamphlet Given - Laparoscopic Hernia Repair: discussed with patient and provided information. Pt Education - [facility] Mesh education: discussed with patient and provided  information.  BRAIN ANEURYSM (I67.1) Impression: History of brain aneurysms requiring coiling. Fully anticoagulated. Ideally patient needs to be off all anticoagulation perioperatively.   ANTICOAGULATED (Z79.01)  Current Plans I recommended obtaining preoperative cardiac clearance. I am concerned about the health of the patient and the ability to tolerate the operation. Therefore, we will request clearance by cardiology to better assess operative risk & see if a reevaluation, further workup, etc is needed. Also recommendations on how medications such as  for anticoagulation and blood pressure should be managed/held/restarted after surgery. Pt Education - [facility] Hold anticoagulation preoperatively  BODY MASS INDEX [BMI] 30.0-30.9, ADULT (Z68.30) Impression: Central morbid obesity. Makes risk for recurrence higher. Therefore plan mesh underlay repair laparoscopically.  Ardeth Sportsman, MD, FACS, MASCRS Gastrointestinal and Minimally Invasive Surgery  [facility] [place] [facility] [address] [place], [place] [address] [phone] Fax [phone] Main/Paging  CONTACT INFORMATION: Weekday (9AM-5PM) concerns: Call [facility] main office at [phone] Weeknight (5PM-9AM) or Weekend/Holiday concerns: Check [email or web address] for General Surgery [facility] coverage (Please, do not use SecureChat as it is not reliable communication to operating surgeons for immediate patient care)

## 2020-09-20 ENCOUNTER — Encounter (HOSPITAL_COMMUNITY): Payer: Self-pay | Admitting: Surgery

## 2020-09-20 ENCOUNTER — Other Ambulatory Visit: Payer: Self-pay

## 2020-09-20 NOTE — Patient Instructions (Addendum)
DUE TO COVID-19 ONLY ONE VISITOR IS ALLOWED TO COME WITH YOU AND STAY IN THE WAITING ROOM ONLY DURING PRE OP AND PROCEDURE.   IF YOU WILL BE ADMITTED INTO THE HOSPITAL YOU ARE ALLOWED ONE SUPPORT PERSON DURING VISITATION HOURS ONLY (10AM -8PM)   . The support person may change daily. . The support person must pass our screening, gel in and out, and wear a mask at all times, including in the patient's room. . Patients must also wear a mask when staff or their support person are in the room.   COVID SWAB TESTING  COMPLETED ON:  Saturday, Dec. 11, 2021   (Must self quarantine after testing. Follow instructions on handout.)       Your procedure is scheduled on: Tuesday, Dec. 14, 2021   Report to [facility] Main  Entrance    Report to admitting at 12:15 PM   Call this number if you have problems the morning of surgery [phone]   Drink 2 G2 drinks the night before surgery at 10:00 PM    Do not eat food :After Midnight.   May have liquids until 11:15 AM   day of surgery  CLEAR LIQUID DIET  Foods Allowed                                                                     Foods Excluded  Water, Black Coffee and tea, regular and decaf                             liquids that you cannot  Plain Jell-O in any flavor  (No red)                                           see through such as: Fruit ices (not with fruit pulp)                                     milk, soups, orange juice              Iced Popsicles (No red)                                    All solid food                                   Apple juices Sports drinks like Gatorade (No red) Lightly seasoned clear broth or consume(fat free) Sugar, honey syrup  Sample Menu Breakfast                                Lunch  Supper Cranberry juice                    Beef broth                            Chicken broth Jell-O                                     Grape juice                            Apple juice Coffee or tea                        Jell-O                                      Popsicle                                                Coffee or tea                        Coffee or tea      Complete one G2 drink the morning of surgery at 11:15 AM      the day of surgery.     Oral Hygiene is also important to reduce your risk of infection.                                    Remember - BRUSH YOUR TEETH THE MORNING OF SURGERY WITH YOUR REGULAR TOOTHPASTE   Do NOT smoke after Midnight   Take these medicines the morning of surgery with A SIP OF WATER: Amiodarone, Nexium   Use Inhaler morning of procedure   Bring Rescue Inhaler day of procedure   Bring CPAP mask and tubing day of procedure  DO NOT TAKE ANY ORAL DIABETIC MEDICATIONS DAY OF YOUR SURGERY                               You may not have any metal on your body including jewelry, and body piercings             Do not wear  lotions, powders, perfumes/cologne, or deodorant                        Men may shave face and neck.   Do not bring valuables to the hospital. [place] IS NOT             RESPONSIBLE   FOR VALUABLES.   Contacts, dentures or bridgework may not be worn into surgery.   Bring small overnight bag day of surgery.    Patients discharged the day of surgery will not be allowed to drive home.   Special Instructions: Bring a copy of your healthcare power of attorney and living will documents         the day of surgery  if you haven't scanned them in before.              Please read over the following fact sheets you were given: IF YOU HAVE QUESTIONS ABOUT YOUR PRE OP INSTRUCTIONS PLEASE CALL [phone]  How to Manage Your Diabetes Before and After Surgery  Why is it important to control my blood sugar before and after surgery? . Improving blood sugar levels before and after surgery helps healing and can limit problems. . A way of improving blood sugar control is eating a healthy diet  by: o  Eating less sugar and carbohydrates o  Increasing activity/exercise o  Talking with your doctor about reaching your blood sugar goals . High blood sugars (greater than 180 mg/dL) can raise your risk of infections and slow your recovery, so you will need to focus on controlling your diabetes during the weeks before surgery. . Make sure that the doctor who takes care of your diabetes knows about your planned surgery including the date and location.  How do I manage my blood sugar before surgery? . Check your blood sugar at least 4 times a day, starting 2 days before surgery, to make sure that the level is not too high or low. o Check your blood sugar the morning of your surgery when you wake up and every 2 hours until you get to the Short Stay unit. . If your blood sugar is less than 70 mg/dL, you will need to treat for low blood sugar: o Do not take insulin. o Treat a low blood sugar (less than 70 mg/dL) with  cup of clear juice (cranberry or apple), 4 glucose tablets, OR glucose gel. o Recheck blood sugar in 15 minutes after treatment (to make sure it is greater than 70 mg/dL). If your blood sugar is not greater than 70 mg/dL on recheck, call [phone] for further instructions. . Report your blood sugar to the short stay nurse when you get to Short Stay.  . If you are admitted to the hospital after surgery: o Your blood sugar will be checked by the staff and you will probably be given insulin after surgery (instead of oral diabetes medicines) to make sure you have good blood sugar levels. o The goal for blood sugar control after surgery is 80-180 mg/dL.   WHAT DO I DO ABOUT MY DIABETES MEDICATION?  [name] Do not take oral diabetes medicines (pills) the morning of surgery.  Reviewed and Endorsed by [facility] Patient Education Committee, August 2015  [facility] - Preparing for Surgery Before surgery, you can play an important role.  Because skin is not sterile, your skin needs to  be as free of germs as possible.  You can reduce the number of germs on your skin by washing with CHG (chlorahexidine gluconate) soap before surgery.  CHG is an antiseptic cleaner which kills germs and bonds with the skin to continue killing germs even after washing. Please DO NOT use if you have an allergy to CHG or antibacterial soaps.  If your skin becomes reddened/irritated stop using the CHG and inform your nurse when you arrive at Short Stay. Do not shave (including legs and underarms) for at least 48 hours prior to the first CHG shower.  You may shave your face/neck.  Please follow these instructions carefully:  1.  Shower with CHG Soap the night before surgery and the  morning of surgery.  2.  If you choose to wash your hair, wash your hair first as usual  with your normal  shampoo.  3.  After you shampoo, rinse your hair and body thoroughly to remove the shampoo.                             4.  Use CHG as you would any other liquid soap.  You can apply chg directly to the skin and wash.  Gently with a scrungie or clean washcloth.  5.  Apply the CHG Soap to your body ONLY FROM THE NECK DOWN.   Do   not use on face/ open                           Wound or open sores. Avoid contact with eyes, ears mouth and   genitals (private parts).                       Wash face,  Genitals (private parts) with your normal soap.             6.  Wash thoroughly, paying special attention to the area where your    surgery  will be performed.  7.  Thoroughly rinse your body with warm water from the neck down.  8.  DO NOT shower/wash with your normal soap after using and rinsing off the CHG Soap.                9.  Pat yourself dry with a clean towel.            10.  Wear clean pajamas.            11.  Place clean sheets on your bed the night of your first shower and do not  sleep with pets. Day of Surgery : Do not apply any lotions/deodorants the morning of surgery.  Please wear clean clothes to the  hospital/surgery center.  FAILURE TO FOLLOW THESE INSTRUCTIONS MAY RESULT IN THE CANCELLATION OF YOUR SURGERY  PATIENT SIGNATURE_________________________________  NURSE SIGNATURE__________________________________  ________________________________________________________________________

## 2020-09-20 NOTE — Progress Notes (Signed)
COVID Vaccine Completed: Yes Date COVID Vaccine completed: 08/16/20, 11/27/19, 3/0/21 COVID vaccine manufacturer: Pfizer      PCP - Tracey Harries, MD Cardiologist - Dr. Sharyn Lull  Chest x-ray - N/A EKG - requests from Dr. Pearla Dubonnet Stress Test - greater than 2 years ECHO - greater than 2 years Cardiac Cath - N/A Pacemaker/ICD device last checked: N/A  Sleep Study - Yes CPAP - Yes  Fasting Blood Sugar - N/A Checks Blood Sugar _N/A____ times a day  Blood Thinner Instructions: Eliquis and Plavix last dose 09/20/2020 Aspirin Instructions: N/A Last Dose: N/A  Activity level:  Walk about 5 miles daily   Anesthesia review: A fib.,OSA, DM, COPD, HTN, CAD, History of Stroke, CKD  Patient denies shortness of breath, fever, cough and chest pain at PAT appointment   Patient verbalized understanding of instructions that were given to them at the PAT appointment. Patient was also instructed that they will need to review over the PAT instructions again at home before surgery.

## 2020-09-21 ENCOUNTER — Other Ambulatory Visit (HOSPITAL_COMMUNITY)
Admission: RE | Admit: 2020-09-21 | Discharge: 2020-09-21 | Disposition: A | Discharge status: Home / Self Care | Payer: Medicare HMO | Source: Ambulatory Visit | Attending: Surgery | Admitting: Surgery

## 2020-09-21 DIAGNOSIS — Z20822 Contact with and (suspected) exposure to covid-19: Secondary | ICD-10-CM | POA: Insufficient documentation

## 2020-09-21 DIAGNOSIS — Z01812 Encounter for preprocedural laboratory examination: Secondary | ICD-10-CM | POA: Insufficient documentation

## 2020-09-22 LAB — SARS CORONAVIRUS 2 (TAT 6-24 HRS): SARS Coronavirus 2: NEGATIVE

## 2020-09-23 ENCOUNTER — Other Ambulatory Visit: Payer: Self-pay

## 2020-09-23 ENCOUNTER — Encounter (HOSPITAL_COMMUNITY)
Admission: RE | Admit: 2020-09-23 | Discharge: 2020-09-23 | Disposition: A | Discharge status: Home / Self Care | Payer: Medicare HMO | Source: Ambulatory Visit | Attending: Surgery | Admitting: Surgery

## 2020-09-23 DIAGNOSIS — Z01812 Encounter for preprocedural laboratory examination: Secondary | ICD-10-CM | POA: Insufficient documentation

## 2020-09-23 LAB — CBC
HCT: 41.2 % (ref 39.0–52.0)
Hemoglobin: 13.4 g/dL (ref 13.0–17.0)
MCH: 29.4 pg (ref 26.0–34.0)
MCHC: 32.5 g/dL (ref 30.0–36.0)
MCV: 90.4 fL (ref 80.0–100.0)
Platelets: 250 10*3/uL (ref 150–400)
RBC: 4.56 MIL/uL (ref 4.22–5.81)
RDW: 12.6 % (ref 11.5–15.5)
WBC: 5.6 10*3/uL (ref 4.0–10.5)
nRBC: 0 % (ref 0.0–0.2)

## 2020-09-23 LAB — BASIC METABOLIC PANEL
Anion gap: 8 (ref 5–15)
BUN: 12 mg/dL (ref 6–20)
CO2: 27 mmol/L (ref 22–32)
Calcium: 9 mg/dL (ref 8.9–10.3)
Chloride: 102 mmol/L (ref 98–111)
Creatinine, Ser: 0.82 mg/dL (ref 0.61–1.24)
GFR, Estimated: >60 mL/min (ref >60)
Glucose, Bld: 99 mg/dL (ref 70–99)
Potassium: 4.2 mmol/L (ref 3.5–5.1)
Sodium: 137 mmol/L (ref 135–145)

## 2020-09-23 LAB — HEMOGLOBIN A1C
Hgb A1c MFr Bld: 5.6 % (ref 4.8–5.6)
Mean Plasma Glucose: 114.02 mg/dL

## 2020-09-23 LAB — GLUCOSE, CAPILLARY: Glucose-Capillary: 92 mg/dL (ref 70–99)

## 2020-09-23 MED ORDER — BUPIVACAINE LIPOSOME 1.3 % IJ SUSP
20.0000 mL | Freq: Once | INTRAMUSCULAR | Status: DC
  Filled 2020-09-23: qty 20

## 2020-09-24 ENCOUNTER — Encounter (HOSPITAL_COMMUNITY)
Admission: RE | Disposition: A | Discharge status: Home / Self Care | Payer: Self-pay | Source: Home / Self Care | Attending: Surgery

## 2020-09-24 ENCOUNTER — Other Ambulatory Visit: Payer: Self-pay

## 2020-09-24 ENCOUNTER — Ambulatory Visit (HOSPITAL_COMMUNITY)
Admission: RE | Admit: 2020-09-24 | Discharge: 2020-09-24 | Disposition: A | Discharge status: Home / Self Care | Payer: Medicare HMO | Attending: Surgery | Admitting: Surgery

## 2020-09-24 ENCOUNTER — Ambulatory Visit (HOSPITAL_COMMUNITY): Payer: Medicare HMO | Admitting: Certified Registered Nurse Anesthetist

## 2020-09-24 ENCOUNTER — Encounter (HOSPITAL_COMMUNITY): Payer: Self-pay | Admitting: Surgery

## 2020-09-24 ENCOUNTER — Ambulatory Visit (HOSPITAL_COMMUNITY): Payer: Medicare HMO | Admitting: Physician Assistant

## 2020-09-24 DIAGNOSIS — E1122 Type 2 diabetes mellitus with diabetic chronic kidney disease: Secondary | ICD-10-CM | POA: Diagnosis not present

## 2020-09-24 DIAGNOSIS — I129 Hypertensive chronic kidney disease with stage 1 through stage 4 chronic kidney disease, or unspecified chronic kidney disease: Secondary | ICD-10-CM | POA: Diagnosis not present

## 2020-09-24 DIAGNOSIS — Z8679 Personal history of other diseases of the circulatory system: Secondary | ICD-10-CM | POA: Diagnosis not present

## 2020-09-24 DIAGNOSIS — E662 Morbid (severe) obesity with alveolar hypoventilation: Secondary | ICD-10-CM | POA: Insufficient documentation

## 2020-09-24 DIAGNOSIS — Z79899 Other long term (current) drug therapy: Secondary | ICD-10-CM | POA: Insufficient documentation

## 2020-09-24 DIAGNOSIS — Z87891 Personal history of nicotine dependence: Secondary | ICD-10-CM | POA: Insufficient documentation

## 2020-09-24 DIAGNOSIS — G4730 Sleep apnea, unspecified: Secondary | ICD-10-CM | POA: Insufficient documentation

## 2020-09-24 DIAGNOSIS — Z7984 Long term (current) use of oral hypoglycemic drugs: Secondary | ICD-10-CM | POA: Diagnosis not present

## 2020-09-24 DIAGNOSIS — Z6838 Body mass index (BMI) 38.0-38.9, adult: Secondary | ICD-10-CM | POA: Insufficient documentation

## 2020-09-24 DIAGNOSIS — Z888 Allergy status to other drugs, medicaments and biological substances status: Secondary | ICD-10-CM | POA: Insufficient documentation

## 2020-09-24 DIAGNOSIS — K436 Other and unspecified ventral hernia with obstruction, without gangrene: Secondary | ICD-10-CM | POA: Diagnosis not present

## 2020-09-24 DIAGNOSIS — N189 Chronic kidney disease, unspecified: Secondary | ICD-10-CM | POA: Insufficient documentation

## 2020-09-24 DIAGNOSIS — I2510 Atherosclerotic heart disease of native coronary artery without angina pectoris: Secondary | ICD-10-CM | POA: Diagnosis not present

## 2020-09-24 HISTORY — PX: VENTRAL HERNIA REPAIR: SHX424

## 2020-09-24 LAB — GLUCOSE, CAPILLARY: Glucose-Capillary: 88 mg/dL (ref 70–99)

## 2020-09-24 SURGERY — REPAIR, HERNIA, VENTRAL, LAPAROSCOPIC
Anesthesia: General

## 2020-09-24 MED ORDER — ROCURONIUM BROMIDE 10 MG/ML (PF) SYRINGE
PREFILLED_SYRINGE | INTRAVENOUS | Status: AC
  Filled 2020-09-24: qty 10

## 2020-09-24 MED ORDER — FENTANYL CITRATE (PF) 250 MCG/5ML IJ SOLN
INTRAMUSCULAR | Status: AC
  Filled 2020-09-24: qty 5

## 2020-09-24 MED ORDER — ENSURE PRE-SURGERY PO LIQD
592.0000 mL | Freq: Once | ORAL | Status: DC
  Filled 2020-09-24: qty 592

## 2020-09-24 MED ORDER — ACETAMINOPHEN 500 MG PO TABS
1000.0000 mg | ORAL_TABLET | ORAL | Status: AC
  Administered 2020-09-24: 12:00:00 1000 mg via ORAL
  Filled 2020-09-24: qty 2

## 2020-09-24 MED ORDER — CHLORHEXIDINE GLUCONATE 0.12 % MT SOLN
15.0000 mL | Freq: Once | OROMUCOSAL | Status: AC
  Administered 2020-09-24: 12:00:00 15 mL via OROMUCOSAL

## 2020-09-24 MED ORDER — CEFAZOLIN SODIUM-DEXTROSE 2-4 GM/100ML-% IV SOLN
2.0000 g | INTRAVENOUS | Status: AC
  Administered 2020-09-24: 14:00:00 2 g via INTRAVENOUS
  Filled 2020-09-24: qty 100

## 2020-09-24 MED ORDER — GLYCOPYRROLATE PF 0.2 MG/ML IJ SOSY
PREFILLED_SYRINGE | INTRAMUSCULAR | Status: AC
  Filled 2020-09-24: qty 1

## 2020-09-24 MED ORDER — BUPIVACAINE HCL (PF) 0.25 % IJ SOLN
INTRAMUSCULAR | Status: DC | PRN
  Administered 2020-09-24: 50 mL

## 2020-09-24 MED ORDER — BUPIVACAINE LIPOSOME 1.3 % IJ SUSP
INTRAMUSCULAR | Status: DC | PRN
  Administered 2020-09-24: 20 mL

## 2020-09-24 MED ORDER — FENTANYL CITRATE (PF) 100 MCG/2ML IJ SOLN
INTRAMUSCULAR | Status: DC | PRN
  Administered 2020-09-24 (×5): 50 ug via INTRAVENOUS

## 2020-09-24 MED ORDER — CHLORHEXIDINE GLUCONATE CLOTH 2 % EX PADS: 6.0000 | MEDICATED_PAD | Freq: Once | CUTANEOUS | Status: DC

## 2020-09-24 MED ORDER — PROPOFOL 10 MG/ML IV BOLUS
INTRAVENOUS | Status: DC | PRN
  Administered 2020-09-24: 160 mg via INTRAVENOUS

## 2020-09-24 MED ORDER — EPHEDRINE SULFATE-NACL 50-0.9 MG/10ML-% IV SOSY
PREFILLED_SYRINGE | INTRAVENOUS | Status: DC | PRN
  Administered 2020-09-24: 5 mg via INTRAVENOUS
  Administered 2020-09-24: 10 mg via INTRAVENOUS
  Administered 2020-09-24: 5 mg via INTRAVENOUS

## 2020-09-24 MED ORDER — BUPIVACAINE HCL 0.25 % IJ SOLN
INTRAMUSCULAR | Status: AC
  Filled 2020-09-24: qty 2

## 2020-09-24 MED ORDER — LACTATED RINGERS IV SOLN: INTRAVENOUS | Status: DC

## 2020-09-24 MED ORDER — GLYCOPYRROLATE 0.2 MG/ML IJ SOLN
INTRAMUSCULAR | Status: DC | PRN
  Administered 2020-09-24: .2 mg via INTRAVENOUS

## 2020-09-24 MED ORDER — CELECOXIB 200 MG PO CAPS
200.0000 mg | ORAL_CAPSULE | ORAL | Status: AC
  Administered 2020-09-24: 12:00:00 200 mg via ORAL
  Filled 2020-09-24: qty 1

## 2020-09-24 MED ORDER — DEXAMETHASONE SODIUM PHOSPHATE 10 MG/ML IJ SOLN
INTRAMUSCULAR | Status: AC
  Filled 2020-09-24: qty 1

## 2020-09-24 MED ORDER — PHENYLEPHRINE 40 MCG/ML (10ML) SYRINGE FOR IV PUSH (FOR BLOOD PRESSURE SUPPORT)
PREFILLED_SYRINGE | INTRAVENOUS | Status: AC
  Filled 2020-09-24: qty 10

## 2020-09-24 MED ORDER — DEXAMETHASONE SODIUM PHOSPHATE 4 MG/ML IJ SOLN
INTRAMUSCULAR | Status: DC | PRN
  Administered 2020-09-24: 4 mg via INTRAVENOUS

## 2020-09-24 MED ORDER — DIPHENHYDRAMINE HCL 50 MG/ML IJ SOLN
INTRAMUSCULAR | Status: AC
  Filled 2020-09-24: qty 1

## 2020-09-24 MED ORDER — ROCURONIUM BROMIDE 100 MG/10ML IV SOLN
INTRAVENOUS | Status: DC | PRN
  Administered 2020-09-24: 50 mg via INTRAVENOUS
  Administered 2020-09-24: 10 mg via INTRAVENOUS
  Administered 2020-09-24: 50 mg via INTRAVENOUS

## 2020-09-24 MED ORDER — LIDOCAINE HCL (CARDIAC) PF 100 MG/5ML IV SOSY
PREFILLED_SYRINGE | INTRAVENOUS | Status: DC | PRN
  Administered 2020-09-24: 60 mg via INTRAVENOUS

## 2020-09-24 MED ORDER — ONDANSETRON HCL 4 MG/2ML IJ SOLN
INTRAMUSCULAR | Status: AC
  Filled 2020-09-24: qty 2

## 2020-09-24 MED ORDER — LIDOCAINE HCL (PF) 2 % IJ SOLN
INTRAMUSCULAR | Status: DC | PRN
  Administered 2020-09-24: 1 mg/kg/h via INTRADERMAL

## 2020-09-24 MED ORDER — FENTANYL CITRATE (PF) 100 MCG/2ML IJ SOLN
25.0000 ug | INTRAMUSCULAR | Status: DC | PRN
  Administered 2020-09-24 (×2): 25 ug via INTRAVENOUS

## 2020-09-24 MED ORDER — OXYCODONE HCL 5 MG PO TABS: 5.0000 mg | ORAL_TABLET | Freq: Once | ORAL | Status: DC | PRN

## 2020-09-24 MED ORDER — MIDAZOLAM HCL 2 MG/2ML IJ SOLN
INTRAMUSCULAR | Status: AC
  Filled 2020-09-24: qty 2

## 2020-09-24 MED ORDER — PROMETHAZINE HCL 25 MG/ML IJ SOLN: 6.2500 mg | INTRAMUSCULAR | Status: DC | PRN

## 2020-09-24 MED ORDER — FENTANYL CITRATE (PF) 100 MCG/2ML IJ SOLN
INTRAMUSCULAR | Status: AC
  Administered 2020-09-24: 17:00:00 25 ug via INTRAVENOUS
  Filled 2020-09-24: qty 2

## 2020-09-24 MED ORDER — GABAPENTIN 300 MG PO CAPS
300.0000 mg | ORAL_CAPSULE | ORAL | Status: DC
  Filled 2020-09-24: qty 1

## 2020-09-24 MED ORDER — EPHEDRINE 5 MG/ML INJ
INTRAVENOUS | Status: AC
  Filled 2020-09-24: qty 10

## 2020-09-24 MED ORDER — LIDOCAINE HCL (PF) 2 % IJ SOLN
INTRAMUSCULAR | Status: AC
  Filled 2020-09-24: qty 5

## 2020-09-24 MED ORDER — DIPHENHYDRAMINE HCL 50 MG/ML IJ SOLN
INTRAMUSCULAR | Status: DC | PRN
  Administered 2020-09-24: 12.5 mg via INTRAVENOUS

## 2020-09-24 MED ORDER — ONDANSETRON HCL 4 MG/2ML IJ SOLN
INTRAMUSCULAR | Status: DC | PRN
  Administered 2020-09-24: 4 mg via INTRAVENOUS

## 2020-09-24 MED ORDER — SUGAMMADEX SODIUM 500 MG/5ML IV SOLN
INTRAVENOUS | Status: DC | PRN
  Administered 2020-09-24: 300 mg via INTRAVENOUS

## 2020-09-24 MED ORDER — ENSURE PRE-SURGERY PO LIQD: 296.0000 mL | Freq: Once | ORAL | Status: DC

## 2020-09-24 MED ORDER — OXYCODONE HCL 5 MG/5ML PO SOLN: 5.0000 mg | Freq: Once | ORAL | Status: DC | PRN

## 2020-09-24 MED ORDER — ORAL CARE MOUTH RINSE: 15.0000 mL | Freq: Once | OROMUCOSAL | Status: AC

## 2020-09-24 MED ORDER — PROPOFOL 10 MG/ML IV BOLUS
INTRAVENOUS | Status: AC
  Filled 2020-09-24: qty 20

## 2020-09-24 MED ORDER — MIDAZOLAM HCL 5 MG/5ML IJ SOLN
INTRAMUSCULAR | Status: DC | PRN
  Administered 2020-09-24: 2 mg via INTRAVENOUS

## 2020-09-24 MED ORDER — TRAMADOL HCL 50 MG PO TABS: 50.0000 mg | ORAL_TABLET | Freq: Four times a day (QID) | ORAL | 0 refills | Status: DC | PRN

## 2020-09-24 SURGICAL SUPPLY — 44 items
APL PRP STRL LF DISP 70% ISPRP (MISCELLANEOUS) ×1
APPLIER CLIP 5 13 M/L LIGAMAX5 (MISCELLANEOUS)
APR CLP MED LRG 5 ANG JAW (MISCELLANEOUS)
BINDER ABDOMINAL 12 ML 46-62 (SOFTGOODS) ×2 IMPLANT
CABLE HIGH FREQUENCY MONO STRZ (ELECTRODE) ×3 IMPLANT
CHLORAPREP W/TINT 26 (MISCELLANEOUS) ×3 IMPLANT
CLIP APPLIE 5 13 M/L LIGAMAX5 (MISCELLANEOUS) IMPLANT
CLOSURE WOUND 1/2 X4 (GAUZE/BANDAGES/DRESSINGS) ×2
COVER SURGICAL LIGHT HANDLE (MISCELLANEOUS) ×3 IMPLANT
COVER WAND RF STERILE (DRAPES) ×1 IMPLANT
DECANTER SPIKE VIAL GLASS SM (MISCELLANEOUS) ×3 IMPLANT
DEVICE SECURE STRAP 25 ABSORB (INSTRUMENTS) ×2 IMPLANT
DEVICE TROCAR PUNCTURE CLOSURE (ENDOMECHANICALS) ×3 IMPLANT
DRAPE WARM FLUID 44X44 (DRAPES) ×3 IMPLANT
DRSG TEGADERM 2-3/8X2-3/4 SM (GAUZE/BANDAGES/DRESSINGS) ×9 IMPLANT
DRSG TEGADERM 4X4.75 (GAUZE/BANDAGES/DRESSINGS) ×3 IMPLANT
ELECT REM PT RETURN 15FT ADLT (MISCELLANEOUS) ×3 IMPLANT
GAUZE SPONGE 2X2 8PLY STRL LF (GAUZE/BANDAGES/DRESSINGS) IMPLANT
GLOVE ECLIPSE 8.0 STRL XLNG CF (GLOVE) ×17 IMPLANT
GLOVE INDICATOR 8.0 STRL GRN (GLOVE) ×3 IMPLANT
GOWN STRL REUS W/TWL XL LVL3 (GOWN DISPOSABLE) ×10 IMPLANT
IRRIG SUCT STRYKERFLOW 2 WTIP (MISCELLANEOUS) ×3
IRRIGATION SUCT STRKRFLW 2 WTP (MISCELLANEOUS) IMPLANT
KIT BASIN OR (CUSTOM PROCEDURE TRAY) ×3 IMPLANT
KIT TURNOVER KIT A (KITS) ×2 IMPLANT
MARKER SKIN DUAL TIP RULER LAB (MISCELLANEOUS) ×3 IMPLANT
MESH VENTRALIGHT ST 8X10 (Mesh General) ×2 IMPLANT
NDL SPNL 22GX3.5 QUINCKE BK (NEEDLE) IMPLANT
NEEDLE SPNL 22GX3.5 QUINCKE BK (NEEDLE) ×3 IMPLANT
PAD POSITIONING PINK XL (MISCELLANEOUS) ×3 IMPLANT
PENCIL SMOKE EVACUATOR (MISCELLANEOUS) ×2 IMPLANT
SCISSORS LAP 5X35 DISP (ENDOMECHANICALS) ×3 IMPLANT
SET TUBE SMOKE EVAC HIGH FLOW (TUBING) ×3 IMPLANT
SLEEVE ADV FIXATION 5X100MM (TROCAR) ×3 IMPLANT
SPONGE GAUZE 2X2 STER 10/PKG (GAUZE/BANDAGES/DRESSINGS)
STRIP CLOSURE SKIN 1/2X4 (GAUZE/BANDAGES/DRESSINGS) ×4 IMPLANT
SUT MNCRL AB 4-0 PS2 18 (SUTURE) ×3 IMPLANT
SUT PDS AB 1 CT1 27 (SUTURE) ×8 IMPLANT
SUT PROLENE 1 CT 1 30 (SUTURE) ×17 IMPLANT
TOWEL OR 17X26 10 PK STRL BLUE (TOWEL DISPOSABLE) ×3 IMPLANT
TRAY LAPAROSCOPIC (CUSTOM PROCEDURE TRAY) ×3 IMPLANT
TROCAR ADV FIXATION 11X100MM (TROCAR) IMPLANT
TROCAR ADV FIXATION 5X100MM (TROCAR) ×3 IMPLANT
TROCAR BLADELESS OPT 5 100 (ENDOMECHANICALS) ×3 IMPLANT

## 2020-09-24 NOTE — Transfer of Care (Signed)
Immediate Anesthesia Transfer of Care Note  Patient: [name]  Procedure(s) Performed: LAPAROSCOPIC VENTRAL WALL HERNIA REPAIR WITH MESH (N/A )  Patient Location: PACU  Anesthesia Type:General  Level of Consciousness: awake, alert  and oriented  Airway & Oxygen Therapy: Patient Spontanous Breathing and Patient connected to face mask  Post-op Assessment: Report given to RN and Post -op Vital signs reviewed and stable  Post vital signs: Reviewed and stable  Last Vitals:  Vitals Value Taken Time  BP    Temp    Pulse 64 09/24/20 1640  Resp 13 09/24/20 1640  SpO2 100 % 09/24/20 1640  Vitals shown include unvalidated device data.  Last Pain:  Vitals:   09/24/20 1225  TempSrc: Oral  PainSc:       Patients Stated Pain Goal: 4 (09/24/20 1216)  Complications: No complications documented.

## 2020-09-24 NOTE — Anesthesia Preprocedure Evaluation (Addendum)
Anesthesia Evaluation  Patient identified by MRN, date of birth, ID band Patient awake    Reviewed: Allergy & Precautions, NPO status , Patient's Chart, lab work & pertinent test results  Airway Mallampati: II  TM Distance: >3 FB Neck ROM: Full    Dental  (+) Edentulous Upper, Edentulous Lower   Pulmonary asthma , sleep apnea and Continuous Positive Airway Pressure Ventilation , COPD,  COPD inhaler, former smoker,    Pulmonary exam normal breath sounds clear to auscultation       Cardiovascular hypertension, Pt. 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), Residual Symptoms    GI/Hepatic Neg liver ROS, hiatal hernia,   Endo/Other  diabetes, Oral Hypoglycemic Agents  Renal/GU negative Renal ROS     Musculoskeletal  (+) Arthritis , Chronic lower back pain   Abdominal (+) + obese,   Peds  Hematology negative hematology ROS (+)   Anesthesia Other Findings PERIUMBILICAL INCARCERATED ABDOMINAL WALL HERNIA  Reproductive/Obstetrics                            Anesthesia Physical Anesthesia Plan  ASA: III  Anesthesia Plan: General   Post-op Pain Management:    Induction: Intravenous  PONV Risk Score and Plan: 4 or greater and Ondansetron, Dexamethasone, Midazolam and Treatment may vary due to age or medical condition  Airway Management Planned: Oral ETT  Additional Equipment:   Intra-op Plan:   Post-operative Plan: Extubation in OR  Informed Consent: I have reviewed the patients History and Physical, chart, labs and discussed the procedure including the risks, benefits and alternatives for the proposed anesthesia with the patient or authorized representative who has indicated  his/her understanding and acceptance.     Dental advisory given  Plan Discussed with: CRNA  Anesthesia Plan Comments:       Anesthesia Quick Evaluation

## 2020-09-24 NOTE — Op Note (Signed)
09/24/2020  PATIENT:  [name]  59 y.o. male  Patient Care Team: Tracey Harries, MD as PCP - General (Family Medicine) Karie Soda, MD as Consulting Physician (General Surgery) Jeani Hawking, MD as Consulting Physician (Gastroenterology) Rinaldo Cloud, MD as Consulting Physician (Cardiology) Micki Riley, MD as Consulting Physician (Neurology)  PRE-OPERATIVE DIAGNOSIS:  PERIUMBILICAL INCARCERATED ABDOMINAL WALL HERNIA  POST-OPERATIVE DIAGNOSIS:  PERIUMBILICAL INCARCERATED ABDOMINAL WALL HERNIAS  PROCEDURE:   LAPAROSCOPIC REPAIR OF  PERIUMBILICAL INCARCERATED ABDOMINAL WALL HERNIAS WITH MESH  TAP BLOCK - BILATERAL  SURGEON:  Ardeth Sportsman, MD  ASSISTANT: Nurse   ANESTHESIA:     General  Nerve block provided with liposomal bupivacaine (Experel) mixed with 0.25% bupivacaine as a Bilateral TAP block x 80mL each side at the level of the transverse abdominis & preperitoneal spaces along the flank at the anterior axillary line, from subcostal ridge to iliac crest under laparoscopic guidance   EBL:  Total I/O In: 1000 [I.V.:1000] Out: 25 [Blood:25]  Per anesthesia record  Delay start of Pharmacological VTE agent (>24hrs) due to surgical blood loss or risk of bleeding:  no  DRAINS: none   SPECIMEN:  No Specimen  DISPOSITION OF SPECIMEN:  N/A  COUNTS:  YES  PLAN OF CARE: Discharge to home after PACU  PATIENT DISPOSITION:  PACU - hemodynamically stable.  INDICATION: Pleasant patient has developed a ventral wall abdominal hernia. Recommendation was made for surgical repair  The anatomy & physiology of the abdominal wall was discussed. The pathophysiology of hernias was discussed. Natural history risks without surgery including progeressive enlargement, pain, incarceration & strangulation was discussed. Contributors to complications such as smoking, obesity, diabetes, prior surgery, etc were discussed.  I feel the risks of no intervention will lead to serious  problems that outweigh the operative risks; therefore, I recommended surgery to reduce and repair the hernia. I explained laparoscopic techniques with possible need for an open approach. I noted the probable use of mesh to patch and/or buttress the hernia repair.  Risks such as bleeding, infection, abscess, need for further treatment, heart attack, death, and other risks were discussed. I noted a good likelihood this will help address the problem. Goals of post-operative recovery were discussed as well. Possibility that this will not correct all symptoms was explained. I stressed the importance of low-impact activity, aggressive pain control, avoiding constipation, & not pushing through pain to minimize risk of post-operative chronic pain or injury. Possibility of reherniation especially with smoking, obesity, diabetes, immunosuppression, and other health conditions was discussed. We will work to minimize complications.   An educational handout further explaining the pathology & treatment options was given as well. Questions were answered. The patient expresses understanding & wishes to proceed with surgery.   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  Most of them less than 2 cm in size.  Type of repair: Laparoscopic underlay repair.  Primary repair of largest hernia   Placement of mesh: Centrally intraperitoneal with edges tucked into RECTRORECTUS & preperitoneal space  Name of mesh: Bard Ventralight dual sided (polypropylene / Seprafilm)  Size of mesh: 25x20cm  Orientation: Transverse  Mesh overlap:  5-7cm   DESCRIPTION:   Informed consent was confirmed. The patient underwent general anaesthesia without difficulty. The patient was positioned appropriately. VTE prevention in place. The patient's abdomen was clipped, prepped, & draped in a sterile fashion. Surgical timeout confirmed our plan.  The patient was positioned in reverse Trendelenburg. Abdominal entry was  gained using  optical entry technique in the left upper abdomen. Entry was clean. I induced carbon dioxide insufflation. Camera inspection revealed no injury. Extra ports were carefully placed under direct laparoscopic visualization.   I could see hernias on the parietal peritoneum under the abdominal wall.  I did laparoscopic lysis of adhesions to expose the entire anterior abdominal wall.  I primarily used focused sharp dissection.  I freed off the falciform ligament and central peritoneum to expose the retrorectus fascia   I made sure hemostasis was good.  I mapped out the region using a needle passer.   To ensure that I would have at least 5 cm radial coverage outside of the hernia defect, I chose a 25x20cm dual sided mesh.  I placed #1 Prolene stitches around its edge about every 5 cm = 12 total.  I rolled the mesh & placed into the peritoneal cavity through the highest hernia defect.  I unrolled the mesh and positioned it appropriately.  I secured the mesh to cover up the hernia defect using a laparoscopic suture passer to pass the tails of the Prolene through the abdominal wall & tagged them with clamps for good transfascial suturing.  I started out in four corners to make sure I had the mesh centered under the hernia defect appropriately, and then proceeded to work in quadrants.    We evacuated CO2 & desufflated the abdomen.  I tied the fascial stitches down. I closed the fascial defect that I placed the mesh through using #1 PDS interrupted transverse stitches primarily.  I reinsufflated the abdomen. The mesh provided at least circumferential coverage around the entire region of hernia defects.  I secured the mesh centrally with an additional trans fascial stitch in & out the mesh using #1 PDS under laparoscopic visualization.   I tacked the edges & central part of the mesh to the peritoneum/posterior rectus fascia with SecureStrap absorbable tacks.   I did reinspection. Hemostasis was good. Mesh laid  well. I completed a broad field block of local anesthesia at fascial stitch sites & fascial closure areas.    Capnoperitoneum was evacuated. Ports were removed. The skin was closed with Monocryl at the port sites and Steri-Strips on the fascial stitch puncture sites.  Patient is being extubated to go to the recovery room.  I discussed operative findings, updated the patient's status, discussed probable steps to recovery, and gave postoperative recommendations to the patient's mother.  Recommendations were made.  Questions were answered.  She expressed understanding & appreciation.  Ardeth Sportsman, M.D., F.A.C.S. Gastrointestinal and Minimally Invasive Surgery [facility] [address], [address] [place], [place] [address] [phone] Main / Paging  09/24/2020 4:36 PM

## 2020-09-24 NOTE — Anesthesia Procedure Notes (Signed)
Procedure Name: Intubation Performed by: Rosaland Lao, CRNA Pre-anesthesia Checklist: Patient identified, Emergency Drugs available, Suction available and Patient being monitored Patient Re-evaluated:Patient Re-evaluated prior to induction Oxygen Delivery Method: Circle system utilized Preoxygenation: Pre-oxygenation with 100% oxygen Induction Type: IV induction Ventilation: Mask ventilation without difficulty and Oral airway inserted - appropriate to patient size Laryngoscope Size: Mac and 4 Grade View: Grade I Tube type: Oral Tube size: 7.5 mm Number of attempts: 1 Airway Equipment and Method: Stylet and Oral airway Placement Confirmation: ETT inserted through vocal cords under direct vision,  positive ETCO2 and breath sounds checked- equal and bilateral Secured at: 23 cm Tube secured with: Tape Dental Injury: Teeth and Oropharynx as per pre-operative assessment

## 2020-09-24 NOTE — Interval H&P Note (Signed)
History and Physical Interval Note:  09/24/2020 1:32 PM  [name]  has presented today for surgery, with the diagnosis of PERIUMBILICAL INCARCERATED ABDOMINAL WALL HERNIA.  The various methods of treatment have been discussed with the patient and family. After consideration of risks, benefits and other options for treatment, the patient has consented to  Procedure(s): LAPAROSCOPIC VENTRAL WALL HERNIA REPAIR (N/A) as a surgical intervention.  The patient's history has been reviewed, patient examined, no change in status, stable for surgery.  I have reviewed the patient's chart and labs.  Questions were answered to the patient's satisfaction.    I have re-reviewed the the patient's records, history, medications, and allergies.  I have re-examined the patient.  I again discussed intraoperative plans and goals of post-operative recovery.  The patient agrees to proceed.  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) 12/15/2018   OSA and COPD overlap syndrome (HCC) 12/13/2017   OSA on CPAP 12/13/2017   COPD (chronic obstructive pulmonary disease) with chronic bronchitis (HCC) 06/22/2017   Class 2 obesity with alveolar hypoventilation, serious comorbidity, and body mass index (BMI) of 38.0 to 38.9 in adult (HCC) 06/22/2017   Cerebral embolism with transient ischemic attack (TIA) 06/22/2017   Postoperative hypoxemia 06/22/2017   Nocturia more than twice per night 06/22/2017   Aneurysm (HCC) 05/11/2017   Atrial fibrillation with RVR (HCC)  12/17/2016   Carotid stenosis, symptomatic w/o infarct 09/25/2015   Paresthesia and pain of right extremity 12/04/2014   Carotid stenosis 12/04/2014   Stroke, hemorrhagic (HCC) 09/01/2014   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) 08/31/2014   TIA (transient ischemic attack) 08/14/2014   Right arm weakness    Cerebral hemorrhage (HCC) 08/11/2014   ICH (intracerebral hemorrhage) (HCC) 08/11/2014   Brain aneurysm 08/08/2014   Chest pain at rest 07/08/2014   right ICAO (internal carotid artery occlusion) 07/06/2014   DM type 2 causing vascular disease (HCC) 07/06/2014   Hyperlipidemia LDL goal <100 07/06/2014   Morbid obesity (HCC) 07/06/2014   Acute CVA (cerebrovascular accident) (HCC) 07/03/2014   GERD (gastroesophageal reflux disease) 10/19/2012   COPD (chronic obstructive pulmonary disease) (HCC) 06/12/2012    Past Medical History:  Diagnosis Date   Aneurysm (HCC)    brain   Arthritis    Atrial fibrillation (HCC)    Bipolar disorder (HCC)    was on meds but was taken off 2 yrs ago and none since   Burning pain    in both legs-seeing Dr.Sethi for this   CAD (coronary artery disease)    Cataracts, bilateral    Childhood asthma    Last asthma attack at age 49; History of trach at 16 months (12/17/2016)   Chronic kidney disease 2009   Chronic lower back pain    COPD (chronic obstructive pulmonary disease) (HCC) 06/2012   uses Spiriva and Albuterol daily as needed   Depression    Family history of adverse reaction to anesthesia    "Mom and sister have PONV"   GERD (gastroesophageal reflux disease)    H/O hiatal hernia    Headache(784.0)    "  q 2-3 weeks; daily last 2 wks" (12/17/2016)   Hyperlipidemia    takes Ramipril daily   Hypertension    takes Ramipril daily   Joint pain    Joint swelling    Middle cerebral artery aneurysm    right   Nocturia    Numbness    both arms    Pneumonia    hx of-2014   PONV (postoperative nausea and vomiting)     Pt reports nausea only.   Sleep apnea    dx just this past week   08/21/2017   Stroke [facility](HCC) 2015   "drag left foot more since; have to wear glasses now" (12/17/2016)   Type II diabetes mellitus (HCC)    takes Metformin daily   dx 2015   Umbilical hernia    Urinary frequency    Wears dentures    Wears glasses    and contact lenses    Past Surgical History:  Procedure Laterality Date   ANEURYSM COILING  2015   CARDIOVERSION N/A 12/18/2016   Procedure: CARDIOVERSION;  Surgeon: Orpah CobbAjay Kadakia, MD;  [facility]: [facility];  Service: Cardiovascular;  Laterality: N/A;   COLONOSCOPY     COLONOSCOPY N/A 04/08/2018   Procedure: COLONOSCOPY;  Surgeon: Jeani HawkingHung, Patrick, MD;  Location: [facility];  Service: Endoscopy;  Laterality: N/A;   ESOPHAGOGASTRODUODENOSCOPY N/A 04/08/2018   Procedure: ESOPHAGOGASTRODUODENOSCOPY (EGD);  Surgeon: Jeani HawkingHung, Patrick, MD;  [facility];  Service: Endoscopy;  Laterality: N/A;   EXCISIONAL HEMORRHOIDECTOMY  1980s   "soon after rectal OR"   GANGLION CYST EXCISION Right    HOT HEMOSTASIS N/A 04/08/2018   Procedure: HOT HEMOSTASIS (ARGON PLASMA COAGULATION/BICAP);  Surgeon: Jeani HawkingHung, Patrick, MD;  [facility];  Service: Endoscopy;  Laterality: N/A;   IR 3D INDEPENDENT WKST  05/10/2017   IR 3D INDEPENDENT WKST  06/28/2017   IR ANGIO INTRA EXTRACRAN SEL COM CAROTID INNOMINATE BILAT MOD SED  04/22/2017   IR ANGIO INTRA EXTRACRAN SEL COM CAROTID INNOMINATE BILAT MOD SED  11/22/2017   IR ANGIO INTRA EXTRACRAN SEL COM CAROTID INNOMINATE BILAT MOD SED  05/13/2018   IR ANGIO INTRA EXTRACRAN SEL INTERNAL CAROTID UNI L MOD SED  12/27/2017   IR ANGIO INTRA EXTRACRAN SEL INTERNAL CAROTID UNI R MOD SED  05/10/2017   IR ANGIO INTRA EXTRACRAN SEL INTERNAL CAROTID UNI R MOD SED  06/28/2017   IR ANGIO VERTEBRAL SEL SUBCLAVIAN INNOMINATE UNI L MOD SED  04/22/2017   IR ANGIO VERTEBRAL SEL SUBCLAVIAN INNOMINATE UNI R MOD SED  06/28/2017   IR ANGIO VERTEBRAL SEL SUBCLAVIAN INNOMINATE UNI  R MOD SED  11/22/2017   IR ANGIO VERTEBRAL SEL SUBCLAVIAN INNOMINATE UNI R MOD SED  05/13/2018   IR ANGIO VERTEBRAL SEL VERTEBRAL UNI R MOD SED  04/22/2017   IR ANGIOGRAM FOLLOW UP STUDY  05/10/2017   IR ANGIOGRAM FOLLOW UP STUDY  06/28/2017   IR ANGIOGRAM FOLLOW UP STUDY  12/27/2017   IR NEURO EACH ADD'L AFTER BASIC UNI LEFT (MS)  12/27/2017   IR NEURO EACH ADD'L AFTER BASIC UNI RIGHT (MS)  05/10/2017   IR NEURO EACH ADD'L AFTER BASIC UNI RIGHT (MS)  06/28/2017   IR RADIOLOGIST EVAL & MGMT  04/30/2017   IR RADIOLOGIST EVAL & MGMT  06/15/2017   IR RADIOLOGIST EVAL & MGMT  07/13/2017   IR RADIOLOGIST EVAL & MGMT  01/11/2018   IR TRANSCATH/EMBOLIZ  05/10/2017   IR TRANSCATH/EMBOLIZ  06/28/2017   IR TRANSCATH/EMBOLIZ  12/27/2017   MULTIPLE TOOTH EXTRACTIONS  POLYPECTOMY  04/08/2018   Procedure: POLYPECTOMY;  Surgeon: Jeani Hawking, MD;  [facility]: [facility];  Service: Endoscopy;;   RADIOLOGY WITH ANESTHESIA N/A 08/08/2014   Procedure: RADIOLOGY WITH ANESTHESIA EMBOLIZATION;  Surgeon: Medication Radiologist, MD;  [facility];  Service: Radiology;  Laterality: N/A;   RADIOLOGY WITH ANESTHESIA N/A 11/21/2014   Procedure: RADIOLOGY WITH ANESTHESIA;  Surgeon: Oneal Grout, MD;  [facility];  Service: Radiology;  Laterality: N/A;   RADIOLOGY WITH ANESTHESIA N/A 02/27/2015   Procedure: RADIOLOGY WITH ANESTHESIA;  Surgeon: [name], MD;  [facility];  Service: Radiology;  Laterality: N/A;   RADIOLOGY WITH ANESTHESIA N/A 09/25/2015   Procedure: RADIOLOGY WITH ANESTHESIA;  Surgeon: [name], MD;  [facility];  Service: Radiology;  Laterality: N/A;   RADIOLOGY WITH ANESTHESIA N/A 05/10/2017   Procedure: EMBOLIZATION;  Surgeon: [name], MD;  [facility];  Service: Radiology;  Laterality: N/A;   RADIOLOGY WITH ANESTHESIA N/A 06/28/2017   Procedure: RADIOLOGY WITH ANESTHESIA-EMBOLIZATION;  Surgeon: [name], MD;  [facility];  Service: Radiology;   Laterality: N/A;   RADIOLOGY WITH ANESTHESIA N/A 08/26/2017   Procedure: RADIOLOGY WITH ANESTHESIA EMBOLIZATION;  Surgeon: [name], MD;  [facility];  Service: Radiology;  Laterality: N/A;   RADIOLOGY WITH ANESTHESIA N/A 11/22/2017   Procedure: EMBOLIZATION;  Surgeon: [name], MD;  [facility];  Service: Radiology;  Laterality: N/A;   RADIOLOGY WITH ANESTHESIA N/A 12/27/2017   Procedure: EMBOLIZATION;  Surgeon: [name], MD;  [facility];  Service: Radiology;  Laterality: N/A;   RECTAL SURGERY  1980s   "tore it lifting heavy furniture; sewed it back together"   SHOULDER ARTHROSCOPY WITH OPEN ROTATOR CUFF REPAIR Right 2016   SHOULDER ARTHROSCOPY WITH ROTATOR CUFF REPAIR Left 1996   SHOULDER OPEN ROTATOR CUFF REPAIR Left 1997   "took out 8inches of my collarbone"   TEE WITHOUT CARDIOVERSION N/A 12/18/2016   Procedure: TRANSESOPHAGEAL ECHOCARDIOGRAM (TEE);  Surgeon: Orpah Cobb, MD;  Location: [facility] ENDOSCOPY;  Service: Cardiovascular;  Laterality: N/A;   TRACHEOSTOMY  1964   at age 78 months old; for asthma   TRACHEOSTOMY CLOSURE      Social History   Socioeconomic History   Marital status: Divorced    Spouse name: not together since 2007   Number of children: 3   Years of education: 10   Highest education level: Not on file  Occupational History   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   Tobacco Use   Smoking status: Former Smoker    Packs/day: 1.50    Years: 25.00    Pack years: 37.50    Types: Cigarettes    Start date: 01/15/1975    Quit date: 06/02/2014    Years since quitting: 6.3   Smokeless tobacco: Never Used  Vaping Use   Vaping Use: Former  Substance and Sexual Activity   Alcohol use: Yes    Alcohol/week: 3.0 standard drinks    Types: 3 Cans of beer per week    Comment: drinks 3 - 12oz bottles every monday, during shooting pool   Drug use: No   Sexual activity: Never    Partners: Female     Comment: partner has had surgery to prevent pregnancy  Other Topics Concern   Not on file  Social History Narrative   Lives with his son and his mother.  His son has no contact with the son's mother, though the  patient is not legally separated from her.  She has a history of drug use and has been in prison several times.   Social Determinants of Health   Financial Resource Strain: Not on file  Food Insecurity: Not on file  Transportation Needs: Not on file  Physical Activity: Not on file  Stress: Not on file  Social Connections: Not on file  Intimate Partner Violence: Not on file    Family History  Problem Relation Age of Onset   Heart disease Mother        s/p 3V CABG   Cancer Father 109       lung cancer; +tobacco   Stroke Father    Cancer Maternal Grandmother    Heart disease Maternal Grandmother    Cancer Paternal Grandmother    Lupus Sister    Multiple sclerosis Sister    Anemia Daughter     Medications Prior to Admission  Medication Sig Dispense Refill Last Dose   amiodarone (PACERONE) 200 MG tablet Take 100 mg by mouth daily.   09/24/2020 at 0800   atorvastatin (LIPITOR) 20 MG tablet Take 20 mg by mouth at bedtime.    09/23/2020 at Unknown time   furosemide (LASIX) 40 MG tablet Take 40 mg by mouth daily.   09/23/2020 at Unknown time   KLOR-CON M10 10 MEQ tablet Take 20 mEq by mouth daily.   09/23/2020 at Unknown time   metFORMIN (GLUCOPHAGE) 500 MG tablet Take 1 tablet (500 mg total) by mouth 2 (two) times daily with a meal. 60 tablet 0 09/23/2020 at Unknown time   ramipril (ALTACE) 5 MG capsule Take 5 mg by mouth daily.   09/23/2020 at Unknown time   umeclidinium-vilanterol (ANORO ELLIPTA) 62.5-25 MCG/INH AEPB Inhale 2 puffs into the lungs daily.   09/24/2020 at Unknown time   albuterol (PROVENTIL HFA;VENTOLIN HFA) 108 (90 BASE) MCG/ACT inhaler Inhale 2 puffs into the lungs every 6 (six) hours as needed for wheezing or shortness of breath.    More than a month at  Unknown time   apixaban (ELIQUIS) 5 MG TABS tablet Take 10 mg by mouth daily.   09/20/2020   clopidogrel (PLAVIX) 75 MG tablet Take 1 tablet (75 mg total) by mouth daily. (Patient taking differently: Take 37.5 mg by mouth daily.)   09/20/2020   esomeprazole (NEXIUM) 40 MG capsule Take 40 mg by mouth daily as needed (Acid reflux).   More than a month at Unknown time   nitroGLYCERIN (NITROSTAT) 0.4 MG SL tablet Place 1 tablet (0.4 mg total) under the tongue every 5 (five) minutes x 3 doses as needed for chest pain. 25 tablet 1 More than a month at Unknown time    Current Facility-Administered Medications  Medication Dose Route Frequency Provider Last Rate Last Admin   bupivacaine liposome (EXPAREL) 1.3 % injection 266 mg  20 mL Infiltration Once Karie Soda, MD       ceFAZolin (ANCEF) IVPB 2g/100 mL premix  2 g Intravenous On Call to OR Karie Soda, MD       Chlorhexidine Gluconate Cloth 2 % PADS 6 each  6 each Topical Once Karie Soda, MD       And   Chlorhexidine Gluconate Cloth 2 % PADS 6 each  6 each Topical Once Karie Soda, MD       Melene Muller ON 09/25/2020] feeding supplement (ENSURE PRE-SURGERY) liquid 296 mL  296 mL Oral Once Karie Soda, MD       feeding supplement (ENSURE PRE-SURGERY)  liquid 592 mL  592 mL Oral Once Karie Soda, MD       gabapentin (NEURONTIN) capsule 300 mg  300 mg Oral On Call to OR Karie Soda, MD       lactated ringers infusion   Intravenous Continuous Eilene Ghazi, MD 10 mL/hr at 09/24/20 1219 New Bag at 09/24/20 1219     Allergies  Allergen Reactions   Tylox [Oxycodone-Acetaminophen] Hives, Other (See Comments) and Rash    Sweating, shaking Reaction: Tremors and diaphoresis    Gabapentin     Gained weight and retained fluid    Adhesive [Tape] Rash    States IV tape is fine    BP (!) 153/78    Pulse (!) 51    Temp 98.1 F (36.7 C) (Oral)    Resp 18    Ht 5\' 8"  (1.727 m)    Wt 115.5 kg    SpO2 99%    BMI 38.71 kg/m   Labs: Results for orders  placed or performed during the hospital encounter of 09/24/20 (from the past 48 hour(s))  Glucose, capillary     Status: None   Collection Time: 09/24/20 12:20 PM  Result Value Ref Range   Glucose-Capillary 88 70 - 99 mg/dL    Comment: Glucose reference range applies only to samples taken after fasting for at least 8 hours.    Imaging / Studies: No results found.   09/26/20, M.D., F.A.C.S. Gastrointestinal and Minimally Invasive Surgery [facility] [address], [address] [place], [place] [phone] Main / Paging  09/24/2020 1:32 PM     09/26/2020

## 2020-09-24 NOTE — Anesthesia Postprocedure Evaluation (Signed)
Anesthesia Post Note  Patient: [name]  Procedure(s) Performed: LAPAROSCOPIC VENTRAL WALL HERNIA REPAIR WITH MESH (N/A )     Patient location during evaluation: PACU Anesthesia Type: General Level of consciousness: awake and alert and oriented Pain management: pain level controlled Vital Signs Assessment: post-procedure vital signs reviewed and stable Respiratory status: spontaneous breathing, nonlabored ventilation and respiratory function stable Cardiovascular status: blood pressure returned to baseline and stable Postop Assessment: no apparent nausea or vomiting Anesthetic complications: no   No complications documented.  Last Vitals:  Vitals:   09/24/20 1730 09/24/20 1745  BP: (!) 154/87 (!) 143/80  Pulse: (!) 54 63  Resp: 12 18  Temp: 36.5 C 36.4 C  SpO2: 96% 95%    Last Pain:  Vitals:   09/24/20 1745  TempSrc:   PainSc: 3                  Aroura Vasudevan A.

## 2020-09-24 NOTE — Discharge Instructions (Signed)
HERNIA REPAIR: POST OP INSTRUCTIONS ° °###################################################################### ° °EAT °Gradually transition to a high fiber diet with a fiber supplement over the next few weeks after discharge.  Start with a pureed / full liquid diet (see below) ° °WALK °Walk an hour a day.  Control your pain to do that.   ° °CONTROL PAIN °Control pain so that you can walk, sleep, tolerate sneezing/coughing, and go up/down stairs. ° °HAVE A BOWEL MOVEMENT DAILY °Keep your bowels regular to avoid problems.  OK to try a laxative to override constipation.  OK to use an antidairrheal to slow down diarrhea.  Call if not better after 2 tries ° °CALL IF YOU HAVE PROBLEMS/CONCERNS °Call if you are still struggling despite following these instructions. °Call if you have concerns not answered by these instructions ° °###################################################################### ° ° ° °1. DIET: Follow a light bland diet & liquids the first 24 hours after arrival home, such as soup, liquids, starches, etc.  Be sure to drink plenty of fluids.  Quickly advance to a usual solid diet within a few days.  Avoid fast food or heavy meals as your are more likely to get nauseated or have irregular bowels.  A low-fat, high-fiber diet for the rest of your life is ideal.  ° °2. Take your usually prescribed home medications unless otherwise directed. ° °3. PAIN CONTROL: °a. Pain is best controlled by a usual combination of three different methods TOGETHER: °i. Ice/Heat °ii. Over the counter pain medication °iii. Prescription pain medication °b. Most patients will experience some swelling and bruising around the hernia(s) such as the bellybutton, groins, or old incisions.  Ice packs or heating pads (30-60 minutes up to 6 times a day) will help. Use ice for the first few days to help decrease swelling and bruising, then switch to heat to help relax tight/sore spots and speed recovery.  Some people prefer to use ice  alone, heat alone, alternating between ice & heat.  Experiment to what works for you.  Swelling and bruising can take several weeks to resolve.   °c. It is helpful to take an over-the-counter pain medication regularly for the first few weeks.  Choose one of the following that works best for you: °i. Naproxen (Aleve, etc)  Two 220mg tabs twice a day °ii. Ibuprofen (Advil, etc) Three 200mg tabs four times a day (every meal & bedtime) °iii. Acetaminophen (Tylenol, etc) 325-650mg four times a day (every meal & bedtime) °d. A  prescription for pain medication should be given to you upon discharge.  Take your pain medication as prescribed.  °i. If you are having problems/concerns with the prescription medicine (does not control pain, nausea, vomiting, rash, itching, etc), please call us [phone] to see if we need to switch you to a different pain medicine that will work better for you and/or control your side effect better. °ii. If you need a refill on your pain medication, please contact your pharmacy.  They will contact our office to request authorization. Prescriptions will not be filled after 5 pm or on week-ends. ° °4. Avoid getting constipated.  Between the surgery and the pain medications, it is common to experience some constipation.  Increasing fluid intake and taking a fiber supplement (such as Metamucil, Citrucel, FiberCon, MiraLax, etc) 1-2 times a day regularly will usually help prevent this problem from occurring.  A mild laxative (prune juice, Milk of Magnesia, MiraLax, etc) should be taken according to package directions if there are no bowel movements after 48   hours.    5. Wash / shower every day.  You may shower over the dressings as they are waterproof.    6. Remove your waterproof bandages, skin tapes, and other bandages 3 days after surgery. You may replace a dressing/Band-Aid to cover the incision for comfort if you wish. You may leave the incisions open to air.  You may replace a  dressing/Band-Aid to cover an incision for comfort if you wish.  Continue to shower over incision(s) after the dressing is off.  7. ACTIVITIES as tolerated:   a. You may resume regular (light) daily activities beginning the next day--such as daily self-care, walking, climbing stairs--gradually increasing activities as tolerated.  Control your pain so that you can walk an hour a day.  If you can walk 30 minutes without difficulty, it is safe to try more intense activity such as jogging, treadmill, bicycling, low-impact aerobics, swimming, etc. b. Save the most intensive and strenuous activity for last such as sit-ups, heavy lifting, contact sports, etc  Refrain from any heavy lifting or straining until you are off narcotics for pain control.   c. DO NOT PUSH THROUGH PAIN.  Let pain be your guide: If it hurts to do something, don't do it.  Pain is your body warning you to avoid that activity for another week until the pain goes down. d. You may drive when you are no longer taking prescription pain medication, you can comfortably wear a seatbelt, and you can safely maneuver your car and apply brakes. e. Bonita Quin may have sexual intercourse when it is comfortable.   8. FOLLOW UP in our office a. Please call [facility] at [phone] to set up an appointment to see your surgeon in the office for a follow-up appointment approximately 2-3 weeks after your surgery. b. Make sure that you call for this appointment the day you arrive home to insure a convenient appointment time.  9.  If you have disability of FMLA / Family leave forms, please bring the forms to the office for processing.  (do not give to your surgeon).  WHEN TO CALL us [phone]: 1. Poor pain control 2. Reactions / problems with new medications (rash/itching, nausea, etc)  3. Fever over 101.5 F (38.5 C) 4. Inability to urinate 5. Nausea and/or vomiting 6. Worsening swelling or bruising 7. Continued bleeding from incision. 8. Increased pain,  redness, or drainage from the incision   The clinic staff is available to answer your questions during regular business hours (8:30am-5pm).  Please dont hesitate to call and ask to speak to one of our nurses for clinical concerns.   If you have a medical emergency, go to the nearest emergency room or call 911.  A surgeon from [facility] is always on call at the hospitals in [facility], [place] [address], [place], [place]  [address] ?  [address] [address], [place], [place]   [address] MAIN: [phone] ? TOLL FREE: [phone] ? FAX: [phone] [email or web address]   Hernia, Adult     A hernia is the bulging of an organ or tissue through a weak spot in the muscles of the abdomen (abdominal wall). Hernias develop most often near the belly button (navel) or the area where the leg meets the lower abdomen (groin). Common types of hernias include:  Incisional hernia. This type bulges through a scar from an abdominal surgery.  Umbilical hernia. This type develops near the navel.  Inguinal hernia. This type develops  in the groin or scrotum.  Femoral hernia. This type develops under the groin, in the upper thigh area.  Hiatal hernia. This type occurs when part of the stomach slides above the muscle that separates the abdomen from the chest (diaphragm). What are the causes? This condition may be caused by:  Heavy lifting.  Coughing over a long period of time.  Straining to have a bowel movement. Constipation can lead to straining.  An incision made during an abdominal surgery.  A physical problem that is present at birth (congenital defect).  Being overweight or obese.  Smoking.  Excess fluid in the abdomen.  Undescended testicles in males.

## 2020-09-25 ENCOUNTER — Encounter (HOSPITAL_COMMUNITY): Payer: Self-pay | Admitting: Surgery

## 2020-10-17 ENCOUNTER — Other Ambulatory Visit: Payer: Self-pay

## 2020-10-17 ENCOUNTER — Encounter (HOSPITAL_COMMUNITY): Payer: Self-pay

## 2020-10-17 DIAGNOSIS — Z7901 Long term (current) use of anticoagulants: Secondary | ICD-10-CM | POA: Insufficient documentation

## 2020-10-17 DIAGNOSIS — Z79899 Other long term (current) drug therapy: Secondary | ICD-10-CM | POA: Diagnosis not present

## 2020-10-17 DIAGNOSIS — Z7984 Long term (current) use of oral hypoglycemic drugs: Secondary | ICD-10-CM | POA: Diagnosis not present

## 2020-10-17 DIAGNOSIS — Z20822 Contact with and (suspected) exposure to covid-19: Secondary | ICD-10-CM | POA: Insufficient documentation

## 2020-10-17 DIAGNOSIS — K922 Gastrointestinal hemorrhage, unspecified: Principal | ICD-10-CM | POA: Insufficient documentation

## 2020-10-17 DIAGNOSIS — E1122 Type 2 diabetes mellitus with diabetic chronic kidney disease: Secondary | ICD-10-CM | POA: Insufficient documentation

## 2020-10-17 DIAGNOSIS — J449 Chronic obstructive pulmonary disease, unspecified: Secondary | ICD-10-CM | POA: Diagnosis not present

## 2020-10-17 DIAGNOSIS — K5521 Angiodysplasia of colon with hemorrhage: Secondary | ICD-10-CM | POA: Insufficient documentation

## 2020-10-17 DIAGNOSIS — N189 Chronic kidney disease, unspecified: Secondary | ICD-10-CM | POA: Diagnosis not present

## 2020-10-17 DIAGNOSIS — J45909 Unspecified asthma, uncomplicated: Secondary | ICD-10-CM | POA: Insufficient documentation

## 2020-10-17 DIAGNOSIS — Z87891 Personal history of nicotine dependence: Secondary | ICD-10-CM | POA: Diagnosis not present

## 2020-10-17 DIAGNOSIS — I2510 Atherosclerotic heart disease of native coronary artery without angina pectoris: Secondary | ICD-10-CM | POA: Diagnosis not present

## 2020-10-17 LAB — CBC WITH DIFFERENTIAL/PLATELET
Abs Immature Granulocytes: 0.02 10*3/uL (ref 0.00–0.07)
Basophils Absolute: 0 10*3/uL (ref 0.0–0.1)
Basophils Relative: 0 %
Eosinophils Absolute: 0.3 10*3/uL (ref 0.0–0.5)
Eosinophils Relative: 5 %
HCT: 34.6 % — ABNORMAL LOW (ref 39.0–52.0)
Hemoglobin: 11.4 g/dL — ABNORMAL LOW (ref 13.0–17.0)
Immature Granulocytes: 0 %
Lymphocytes Relative: 29 %
Lymphs Abs: 2.1 10*3/uL (ref 0.7–4.0)
MCH: 29.2 pg (ref 26.0–34.0)
MCHC: 32.9 g/dL (ref 30.0–36.0)
MCV: 88.7 fL (ref 80.0–100.0)
Monocytes Absolute: 0.6 10*3/uL (ref 0.1–1.0)
Monocytes Relative: 9 %
Neutro Abs: 4.1 10*3/uL (ref 1.7–7.7)
Neutrophils Relative %: 57 %
Platelets: 291 10*3/uL (ref 150–400)
RBC: 3.9 MIL/uL — ABNORMAL LOW (ref 4.22–5.81)
RDW: 12.4 % (ref 11.5–15.5)
WBC: 7.2 10*3/uL (ref 4.0–10.5)
nRBC: 0 % (ref 0.0–0.2)

## 2020-10-17 LAB — COMPREHENSIVE METABOLIC PANEL
ALT: 15 U/L (ref 0–44)
AST: 18 U/L (ref 15–41)
Albumin: 3.7 g/dL (ref 3.5–5.0)
Alkaline Phosphatase: 61 U/L (ref 38–126)
Anion gap: 11 (ref 5–15)
BUN: 14 mg/dL (ref 6–20)
CO2: 23 mmol/L (ref 22–32)
Calcium: 9 mg/dL (ref 8.9–10.3)
Chloride: 104 mmol/L (ref 98–111)
Creatinine, Ser: 0.98 mg/dL (ref 0.61–1.24)
GFR, Estimated: >60 mL/min (ref >60)
Glucose, Bld: 136 mg/dL — ABNORMAL HIGH (ref 70–99)
Potassium: 3.8 mmol/L (ref 3.5–5.1)
Sodium: 138 mmol/L (ref 135–145)
Total Bilirubin: 0.4 mg/dL (ref 0.3–1.2)
Total Protein: 6.7 g/dL (ref 6.5–8.1)

## 2020-10-17 NOTE — ED Triage Notes (Signed)
Pt sts umbilical hernia surgery on 12/14. Today pt experienced sharp mid abdominal pain followed by 2 episodes of bright red blood in stool. Amount increasing with each BM.

## 2020-10-18 ENCOUNTER — Encounter (HOSPITAL_COMMUNITY)
Admission: EM | Disposition: A | Discharge status: Home / Self Care | Payer: Self-pay | Source: Home / Self Care | Attending: Emergency Medicine

## 2020-10-18 ENCOUNTER — Encounter (HOSPITAL_COMMUNITY): Payer: Self-pay | Admitting: Family Medicine

## 2020-10-18 ENCOUNTER — Other Ambulatory Visit: Payer: Self-pay

## 2020-10-18 ENCOUNTER — Observation Stay (HOSPITAL_COMMUNITY): Payer: Medicare HMO | Admitting: Anesthesiology

## 2020-10-18 ENCOUNTER — Observation Stay (HOSPITAL_COMMUNITY)
Admission: EM | Admit: 2020-10-18 | Discharge: 2020-10-19 | Disposition: A | Discharge status: Home / Self Care | Payer: Medicare HMO | Attending: Internal Medicine | Admitting: Internal Medicine

## 2020-10-18 DIAGNOSIS — Z7901 Long term (current) use of anticoagulants: Secondary | ICD-10-CM

## 2020-10-18 DIAGNOSIS — I480 Paroxysmal atrial fibrillation: Secondary | ICD-10-CM | POA: Diagnosis present

## 2020-10-18 DIAGNOSIS — E1159 Type 2 diabetes mellitus with other circulatory complications: Secondary | ICD-10-CM | POA: Diagnosis present

## 2020-10-18 DIAGNOSIS — K625 Hemorrhage of anus and rectum: Secondary | ICD-10-CM

## 2020-10-18 DIAGNOSIS — Z20822 Contact with and (suspected) exposure to covid-19: Secondary | ICD-10-CM | POA: Diagnosis not present

## 2020-10-18 DIAGNOSIS — K5521 Angiodysplasia of colon with hemorrhage: Secondary | ICD-10-CM | POA: Diagnosis not present

## 2020-10-18 DIAGNOSIS — K922 Gastrointestinal hemorrhage, unspecified: Secondary | ICD-10-CM

## 2020-10-18 DIAGNOSIS — G4733 Obstructive sleep apnea (adult) (pediatric): Secondary | ICD-10-CM

## 2020-10-18 DIAGNOSIS — E1122 Type 2 diabetes mellitus with diabetic chronic kidney disease: Secondary | ICD-10-CM | POA: Diagnosis not present

## 2020-10-18 DIAGNOSIS — Z8673 Personal history of transient ischemic attack (TIA), and cerebral infarction without residual deficits: Secondary | ICD-10-CM

## 2020-10-18 DIAGNOSIS — Z9989 Dependence on other enabling machines and devices: Secondary | ICD-10-CM

## 2020-10-18 DIAGNOSIS — J449 Chronic obstructive pulmonary disease, unspecified: Secondary | ICD-10-CM | POA: Diagnosis present

## 2020-10-18 DIAGNOSIS — E785 Hyperlipidemia, unspecified: Secondary | ICD-10-CM | POA: Diagnosis present

## 2020-10-18 HISTORY — PX: HOT HEMOSTASIS: SHX5433

## 2020-10-18 HISTORY — PX: COLONOSCOPY: SHX5424

## 2020-10-18 HISTORY — PX: HEMOSTASIS CLIP PLACEMENT: SHX6857

## 2020-10-18 LAB — TYPE AND SCREEN
ABO/RH(D): A POS
Antibody Screen: NEGATIVE

## 2020-10-18 LAB — POC OCCULT BLOOD, ED: Fecal Occult Bld: POSITIVE — AB

## 2020-10-18 LAB — HEMOGLOBIN AND HEMATOCRIT, BLOOD
HCT: 32.3 % — ABNORMAL LOW (ref 39.0–52.0)
HCT: 30.2 % — ABNORMAL LOW (ref 39.0–52.0)
Hemoglobin: 10.5 g/dL — ABNORMAL LOW (ref 13.0–17.0)
Hemoglobin: 9.8 g/dL — ABNORMAL LOW (ref 13.0–17.0)

## 2020-10-18 LAB — RESP PANEL BY RT-PCR (FLU A&B, COVID) ARPGX2
Influenza A by PCR: NEGATIVE
Influenza B by PCR: NEGATIVE
SARS Coronavirus 2 by RT PCR: NEGATIVE

## 2020-10-18 LAB — PROTIME-INR
INR: 1.1 (ref 0.8–1.2)
Prothrombin Time: 13.4 seconds (ref 11.4–15.2)

## 2020-10-18 LAB — HIV ANTIBODY (ROUTINE TESTING W REFLEX): HIV Screen 4th Generation wRfx: NONREACTIVE

## 2020-10-18 LAB — APTT: aPTT: 38 seconds — ABNORMAL HIGH (ref 24–36)

## 2020-10-18 SURGERY — EGD, WITH ARGON PLASMA COAGULATION
Anesthesia: Monitor Anesthesia Care

## 2020-10-18 MED ORDER — EPHEDRINE SULFATE-NACL 50-0.9 MG/10ML-% IV SOSY
PREFILLED_SYRINGE | INTRAVENOUS | Status: DC | PRN
  Administered 2020-10-18: 10 mg via INTRAVENOUS

## 2020-10-18 MED ORDER — ACETAMINOPHEN 650 MG RE SUPP: 650.0000 mg | Freq: Four times a day (QID) | RECTAL | Status: DC | PRN

## 2020-10-18 MED ORDER — PANTOPRAZOLE SODIUM 40 MG PO TBEC
40.0000 mg | DELAYED_RELEASE_TABLET | Freq: Every day | ORAL | Status: DC
  Administered 2020-10-18 – 2020-10-19 (×2): 40 mg via ORAL
  Filled 2020-10-18 (×2): qty 1

## 2020-10-18 MED ORDER — LACTATED RINGERS IV SOLN: INTRAVENOUS | Status: DC

## 2020-10-18 MED ORDER — RAMIPRIL 5 MG PO CAPS
5.0000 mg | ORAL_CAPSULE | Freq: Every day | ORAL | Status: DC
  Administered 2020-10-18 – 2020-10-19 (×2): 5 mg via ORAL
  Filled 2020-10-18 (×2): qty 1

## 2020-10-18 MED ORDER — PROPOFOL 500 MG/50ML IV EMUL
INTRAVENOUS | Status: DC | PRN
  Administered 2020-10-18: 130 ug/kg/min via INTRAVENOUS

## 2020-10-18 MED ORDER — UMECLIDINIUM-VILANTEROL 62.5-25 MCG/INH IN AEPB
1.0000 | INHALATION_SPRAY | Freq: Every day | RESPIRATORY_TRACT | Status: DC
  Filled 2020-10-18: qty 14

## 2020-10-18 MED ORDER — ATORVASTATIN CALCIUM 10 MG PO TABS
20.0000 mg | ORAL_TABLET | Freq: Every day | ORAL | Status: DC
  Administered 2020-10-18: 20 mg via ORAL
  Filled 2020-10-18: qty 2

## 2020-10-18 MED ORDER — FUROSEMIDE 40 MG PO TABS
40.0000 mg | ORAL_TABLET | Freq: Every day | ORAL | Status: DC
  Administered 2020-10-18 – 2020-10-19 (×2): 40 mg via ORAL
  Filled 2020-10-18 (×2): qty 1

## 2020-10-18 MED ORDER — METFORMIN HCL 500 MG PO TABS
500.0000 mg | ORAL_TABLET | Freq: Two times a day (BID) | ORAL | Status: DC
  Administered 2020-10-18 – 2020-10-19 (×3): 500 mg via ORAL
  Filled 2020-10-18 (×3): qty 1

## 2020-10-18 MED ORDER — PROPOFOL 10 MG/ML IV BOLUS
INTRAVENOUS | Status: AC
  Filled 2020-10-18: qty 20

## 2020-10-18 MED ORDER — ALBUTEROL SULFATE HFA 108 (90 BASE) MCG/ACT IN AERS: 2.0000 | INHALATION_SPRAY | RESPIRATORY_TRACT | Status: DC | PRN

## 2020-10-18 MED ORDER — POTASSIUM CHLORIDE CRYS ER 20 MEQ PO TBCR
20.0000 meq | EXTENDED_RELEASE_TABLET | Freq: Every day | ORAL | Status: DC
  Administered 2020-10-18 – 2020-10-19 (×2): 20 meq via ORAL
  Filled 2020-10-18 (×2): qty 1

## 2020-10-18 MED ORDER — PROPOFOL 10 MG/ML IV BOLUS
INTRAVENOUS | Status: DC | PRN
  Administered 2020-10-18 (×2): 20 mg via INTRAVENOUS
  Administered 2020-10-18: 10 mg via INTRAVENOUS

## 2020-10-18 MED ORDER — SODIUM CHLORIDE 0.9 % IV BOLUS
1000.0000 mL | Freq: Once | INTRAVENOUS | Status: AC
  Administered 2020-10-18: 1000 mL via INTRAVENOUS

## 2020-10-18 MED ORDER — AMIODARONE HCL 100 MG PO TABS
100.0000 mg | ORAL_TABLET | Freq: Every day | ORAL | Status: DC
  Administered 2020-10-18 – 2020-10-19 (×2): 100 mg via ORAL
  Filled 2020-10-18 (×2): qty 1

## 2020-10-18 MED ORDER — SODIUM CHLORIDE 0.9 % IV SOLN: INTRAVENOUS | Status: DC

## 2020-10-18 MED ORDER — ACETAMINOPHEN 325 MG PO TABS: 650.0000 mg | ORAL_TABLET | Freq: Four times a day (QID) | ORAL | Status: DC | PRN

## 2020-10-18 NOTE — Progress Notes (Signed)
PROGRESS NOTE    [name]   WEX:[id number]  DOB: Apr 15, 1961  DOA: 10/18/2020     0  PCP: Tracey Harries, MD  CC: rectal bleeding  Hospital Course: [name] is a 60 y.o. male with medical history significant for history of A. fib, COPD, CKD, diabetes presenting with complaint of rectal bleeding. Patient states that he had an umbilical hernia repair last month and he has had some soreness and discomfort since but seems to be healing.  Just prior to admission he states that upon using the bathroom he started passing a bowel movement that was initially bright red that then turned to dark stools as he continued to have further bowel movements. He also reported seeing some dark clots in the toilet as well. Due to these episodes, he was told to come to the ER for further follow-up and evaluation.  Hemoglobin on initial presentation was 11.4 g/dL which down trended to 93.8 g/dL. GI was consulted on admission and he was taken for colonoscopy. 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 It was treated with BICAP cauterization and 3 hemoclips. However, this was not considered the source of his bleeding on presentation. He was recommended for remaining in the hospital for further hemoglobin trending and monitoring.   Interval History:  Seen this afternoon in the ER awaiting a room. No further rectal bleeding reported even after colonoscopy. He denies any dizziness or lightheadedness. Understands plan is for further monitoring overnight.  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 treated with BiCAP cautery and 3 hemoclips but not considered source of his bleeding -Continue trending H/H -Further recommendations per  GI  OSA on CPAP -Continue nightly CPAP  Morbid obesity (HCC) -Ongoing lifestyle modifications  Hyperlipidemia LDL goal <100 -Continue statin  DM type 2 causing vascular disease (HCC) -Continue SSI and CBG monitoring -Recent A1c 5.6%  COPD (chronic obstructive pulmonary disease) (HCC) -No signs or symptoms of exacerbation -Continue home inhalers   Antimicrobials: n/a  DVT prophylaxis: SCD Code Status: FULL Family Communication: none present Disposition Plan: Status is: Observation  The patient remains OBS appropriate and will d/c before 2 midnights.  Dispo: The patient is from: Home              Anticipated d/c is to: Home              Anticipated d/c date is: 1 day              Patient currently is not medically stable to d/c.  Objective: Blood pressure 116/63, pulse (!) 59, temperature 97.9 F (36.6 C), temperature source Oral, resp. rate 16, height 5\' 8"  (1.727 m), weight 114.6 kg, SpO2 99 %.  Examination: General appearance: alert, cooperative and no distress Head: Normocephalic, without obvious abnormality, atraumatic Eyes: EOMI Lungs: clear to auscultation bilaterally Heart: regular rate and rhythm and S1, S2 normal Abdomen: Obese, soft, bowel sounds present, nontender. Previous surgical incisions noted with some superficial scar tissue appreciated notably around umbilicus Extremities: 1+ lower extremity pitting edema Skin: mobility and turgor normal Neurologic: Grossly normal  Consultants:   GI  Procedures:   Colonoscopy, 10-18-2020  Data Reviewed: I have personally reviewed following labs and imaging studies Results for orders placed or performed during the hospital  encounter of 10/18/20 (from the past 24 hour(s))  CBC with Differential     Status: Abnormal   Collection Time: 10/17/20  9:26 PM  Result Value Ref Range   WBC 7.2 4.0 - 10.5 K/uL   RBC 3.90 (L) 4.22 - 5.81 MIL/uL   Hemoglobin 11.4 (L) 13.0 - 17.0 g/dL   HCT 44.8 (L) 18.5 - 63.1 %    MCV 88.7 80.0 - 100.0 fL   MCH 29.2 26.0 - 34.0 pg   MCHC 32.9 30.0 - 36.0 g/dL   RDW 49.7 02.6 - 37.8 %   Platelets 291 150 - 400 K/uL   nRBC 0.0 0.0 - 0.2 %   Neutrophils Relative % 57 %   Neutro Abs 4.1 1.7 - 7.7 K/uL   Lymphocytes Relative 29 %   Lymphs Abs 2.1 0.7 - 4.0 K/uL   Monocytes Relative 9 %   Monocytes Absolute 0.6 0.1 - 1.0 K/uL   Eosinophils Relative 5 %   Eosinophils Absolute 0.3 0.0 - 0.5 K/uL   Basophils Relative 0 %   Basophils Absolute 0.0 0.0 - 0.1 K/uL   Immature Granulocytes 0 %   Abs Immature Granulocytes 0.02 0.00 - 0.07 K/uL  Comprehensive metabolic panel     Status: Abnormal   Collection Time: 10/17/20  9:26 PM  Result Value Ref Range   Sodium 138 135 - 145 mmol/L   Potassium 3.8 3.5 - 5.1 mmol/L   Chloride 104 98 - 111 mmol/L   CO2 23 22 - 32 mmol/L   Glucose, Bld 136 (H) 70 - 99 mg/dL   BUN 14 6 - 20 mg/dL   Creatinine, Ser 5.88 0.61 - 1.24 mg/dL   Calcium 9.0 8.9 - 50.2 mg/dL   Total Protein 6.7 6.5 - 8.1 g/dL   Albumin 3.7 3.5 - 5.0 g/dL   AST 18 15 - 41 U/L   ALT 15 0 - 44 U/L   Alkaline Phosphatase 61 38 - 126 U/L   Total Bilirubin 0.4 0.3 - 1.2 mg/dL   GFR, Estimated >77 >41 mL/min   Anion gap 11 5 - 15  POC occult blood, ED     Status: Abnormal   Collection Time: 10/18/20  4:03 AM  Result Value Ref Range   Fecal Occult Bld POSITIVE (A) NEGATIVE  Resp Panel by RT-PCR (Flu A&B, Covid) Nasopharyngeal Swab     Status: None   Collection Time: 10/18/20  4:54 AM   Specimen: Nasopharyngeal Swab; Nasopharyngeal(NP) swabs in vial transport medium  Result Value Ref Range   SARS Coronavirus 2 by RT PCR NEGATIVE NEGATIVE   Influenza A by PCR NEGATIVE NEGATIVE   Influenza B by PCR NEGATIVE NEGATIVE  Protime-INR     Status: None   Collection Time: 10/18/20  5:10 AM  Result Value Ref Range   Prothrombin Time 13.4 11.4 - 15.2 seconds   INR 1.1 0.8 - 1.2  APTT     Status: Abnormal   Collection Time: 10/18/20  5:10 AM  Result Value Ref Range    aPTT 38 (H) 24 - 36 seconds  Type and screen [facility]     Status: None   Collection Time: 10/18/20  5:10 AM  Result Value Ref Range   ABO/RH(D) A POS    Antibody Screen NEG    Sample Expiration      10/21/2020,2359 Performed at [facility], [address], [place], [place] [address]   HIV Antibody (routine testing w rflx)  Status: None   Collection Time: 10/18/20  9:11 AM  Result Value Ref Range   HIV Screen 4th Generation wRfx Non Reactive Non Reactive  Hemoglobin and hematocrit, blood     Status: Abnormal   Collection Time: 10/18/20  9:11 AM  Result Value Ref Range   Hemoglobin 10.5 (L) 13.0 - 17.0 g/dL   HCT 32.3 (L) 39.0 - 52.0 %    Recent Results (from the past 240 hour(s))  Resp Panel by RT-PCR (Flu A&B, Covid) Nasopharyngeal Swab     Status: None   Collection Time: 10/18/20  4:54 AM   Specimen: Nasopharyngeal Swab; Nasopharyngeal(NP) swabs in vial transport medium  Result Value Ref Range Status   SARS Coronavirus 2 by RT PCR NEGATIVE NEGATIVE Final    Comment: (NOTE) SARS-CoV-2 target nucleic acids are NOT DETECTED.  The SARS-CoV-2 RNA is generally detectable in upper respiratory specimens during the acute phase of infection. The lowest concentration of SARS-CoV-2 viral copies this assay can detect is 138 copies/mL. A negative result does not preclude SARS-Cov-2 infection and should not be used as the sole basis for treatment or other patient management decisions. A negative result may occur with  improper specimen collection/handling, submission of specimen other than nasopharyngeal swab, presence of viral mutation(s) within the areas targeted by this assay, and inadequate number of viral copies(<138 copies/mL). A negative result must be combined with clinical observations, patient history, and epidemiological information. The expected result is Negative.  Fact Sheet for Patients:   EntrepreneurPulse.com.au  Fact Sheet for Healthcare Providers:  IncredibleEmployment.be  This test is no t yet approved or cleared by the [place] FDA and  has been authorized for detection and/or diagnosis of SARS-CoV-2 by FDA under an Emergency Use Authorization (EUA). This EUA will remain  in effect (meaning this test can be used) for the duration of the COVID-19 declaration under Section 564(b)(1) of the Act, 21 U.S.C.section 360bbb-3(b)(1), unless the authorization is terminated  or revoked sooner.       Influenza A by PCR NEGATIVE NEGATIVE Final   Influenza B by PCR NEGATIVE NEGATIVE Final    Comment: (NOTE) The Xpert Xpress SARS-CoV-2/FLU/RSV plus assay is intended as an aid in the diagnosis of influenza from Nasopharyngeal swab specimens and should not be used as a sole basis for treatment. Nasal washings and aspirates are unacceptable for Xpert Xpress SARS-CoV-2/FLU/RSV testing.  Fact Sheet for Patients: EntrepreneurPulse.com.au  Fact Sheet for Healthcare Providers: IncredibleEmployment.be  This test is not yet approved or cleared by the [place] FDA and has been authorized for detection and/or diagnosis of SARS-CoV-2 by FDA under an Emergency Use Authorization (EUA). This EUA will remain in effect (meaning this test can be used) for the duration of the COVID-19 declaration under Section 564(b)(1) of the Act, 21 U.S.C. section 360bbb-3(b)(1), unless the authorization is terminated or revoked.  Performed at [facility], [place] [address] [place], [place] [address]      Radiology Studies: No results found. No orders to display    Scheduled Meds: . amiodarone  100 mg Oral Daily  . atorvastatin  20 mg Oral QHS  . furosemide  40 mg Oral Daily  . metFORMIN  500 mg Oral BID WC  . pantoprazole  40 mg Oral Daily  . potassium chloride  20 mEq Oral Daily  . ramipril   5 mg Oral Daily  . umeclidinium-vilanterol  1 puff Inhalation Daily   PRN Meds: acetaminophen **OR** acetaminophen, albuterol Continuous Infusions: . lactated  ringers 100 mL/hr at 10/18/20 1202     LOS: 0 days  Time spent: Greater than 50% of the 35 minute visit was spent in counseling/coordination of care for the patient as laid out in the A&P.   Lewie Chamber, MD [facility] 10/18/2020, 5:26 PM

## 2020-10-18 NOTE — Hospital Course (Addendum)
[name] is a 60 y.o. male with medical history significant for history of A. fib, COPD, CKD, diabetes presenting with complaint of rectal bleeding. Patient states that he had an umbilical hernia repair last month and he has had some soreness and discomfort since but seems to be healing.  Just prior to admission he states that upon using the bathroom he started passing a bowel movement that was initially bright red that then turned to dark stools as he continued to have further bowel movements. He also reported seeing some dark clots in the toilet as well. Due to these episodes, he was told to come to the ER for further follow-up and evaluation.  Hemoglobin on initial presentation was 11.4 g/dL which down trended to 16.6 g/dL. GI was consulted on admission and he was taken for colonoscopy. 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 It was treated with BICAP cauterization and 3 hemoclips. However, this was not considered the source of his bleeding on presentation. He was recommended for remaining in the hospital for further hemoglobin trending and monitoring. Hemoglobin remained stable with further monitoring and he had no further rectal bleeding.  He was considered stable for discharging home

## 2020-10-18 NOTE — Transfer of Care (Signed)
Immediate Anesthesia Transfer of Care Note  Patient: [name]  Procedure(s) Performed: FLEXIBLE SIGMOIDOSCOPY (N/A ) HOT HEMOSTASIS (ARGON PLASMA COAGULATION/BICAP) (N/A ) HEMOSTASIS CLIP PLACEMENT  Patient Location: PACU  Anesthesia Type:MAC  Level of Consciousness: sedated  Airway & Oxygen Therapy: Patient Spontanous Breathing and Patient connected to face mask oxygen  Post-op Assessment: Report given to RN and Post -op Vital signs reviewed and stable  Post vital signs: Reviewed and stable  Last Vitals:  Vitals Value Taken Time  BP 93/34 10/18/20 1237  Temp    Pulse 58 10/18/20 1239  Resp 18 10/18/20 1239  SpO2 100 % 10/18/20 1239  Vitals shown include unvalidated device data.  Last Pain:  Vitals:   10/18/20 1151  TempSrc: Oral  PainSc: 0-No pain      Patients Stated Pain Goal: 0 (67/22/77 3750)  Complications: No complications documented.

## 2020-10-18 NOTE — Assessment & Plan Note (Addendum)
Continue statin. 

## 2020-10-18 NOTE — Anesthesia Postprocedure Evaluation (Signed)
Anesthesia Post Note  Patient: [name]  Procedure(s) Performed: FLEXIBLE SIGMOIDOSCOPY (N/A ) HOT HEMOSTASIS (ARGON PLASMA COAGULATION/BICAP) (N/A ) HEMOSTASIS CLIP PLACEMENT     Patient location during evaluation: PACU Anesthesia Type: MAC Level of consciousness: awake and alert Pain management: pain level controlled Vital Signs Assessment: post-procedure vital signs reviewed and stable Respiratory status: spontaneous breathing, nonlabored ventilation and respiratory function stable Cardiovascular status: stable and blood pressure returned to baseline Postop Assessment: no apparent nausea or vomiting Anesthetic complications: no   No complications documented.  Last Vitals:  Vitals:   10/18/20 1250 10/18/20 1300  BP: (!) 124/58 (!) 100/41  Pulse: (!) 56 (!) 51  Resp: 19 16  Temp:    SpO2: 97% 98%    Last Pain:  Vitals:   10/18/20 1300  TempSrc:   PainSc: 0-No pain                 Catalina Gravel

## 2020-10-18 NOTE — ED Notes (Signed)
Pt transferred to hospital bed

## 2020-10-18 NOTE — Anesthesia Preprocedure Evaluation (Addendum)
Anesthesia Evaluation  Patient identified by MRN, date of birth, ID band Patient awake    Reviewed: Allergy & Precautions, NPO status , Patient's Chart, lab work & pertinent test results  History of Anesthesia Complications (+) PONV, Family history of anesthesia reaction and history of anesthetic complications  Airway Mallampati: II  TM Distance: >3 FB Neck ROM: Full    Dental  (+) Dental Advisory Given, Edentulous Lower, Edentulous Upper   Pulmonary asthma , sleep apnea , COPD,  COPD inhaler, former smoker,    Pulmonary exam normal breath sounds clear to auscultation       Cardiovascular hypertension, Pt. on medications + CAD  + dysrhythmias Atrial Fibrillation  Rhythm:Regular Rate:Bradycardia     Neuro/Psych  Headaches, PSYCHIATRIC DISORDERS Depression Bipolar Disorder Right Middle cerebral artery aneurysm TIACVA, Residual Symptoms    GI/Hepatic Neg liver ROS, hiatal hernia, GERD  ,  Endo/Other  diabetes, Type 2, Oral Hypoglycemic Agents  Renal/GU Renal InsufficiencyRenal disease     Musculoskeletal  (+) Arthritis ,   Abdominal   Peds  Hematology  (+) Blood dyscrasia, anemia ,   Anesthesia Other Findings Day of surgery medications reviewed with the patient.  Reproductive/Obstetrics                             Anesthesia Physical Anesthesia Plan  ASA: III  Anesthesia Plan: MAC   Post-op Pain Management:    Induction: Intravenous  PONV Risk Score and Plan: 2 and Propofol infusion and Treatment may vary due to age or medical condition  Airway Management Planned: Nasal Cannula and Natural Airway  Additional Equipment:   Intra-op Plan:   Post-operative Plan:   Informed Consent: I have reviewed the patients History and Physical, chart, labs and discussed the procedure including the risks, benefits and alternatives for the proposed anesthesia with the patient or authorized  representative who has indicated his/her understanding and acceptance.     Dental advisory given  Plan Discussed with: CRNA and Anesthesiologist  Anesthesia Plan Comments:         Anesthesia Quick Evaluation

## 2020-10-18 NOTE — Assessment & Plan Note (Signed)
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

## 2020-10-18 NOTE — Op Note (Signed)
[facility] Patient Name: [name] Procedure Date: 10/18/2020 MRN: [id number] Attending MD: Carol Ada , MD Date of Birth: [birth year] CSN: [id number] Age: 60 Admit Type: Outpatient Procedure:                Colonoscopy Indications:              Hematochezia Providers:                Carol Ada, MD, Cleda Daub, RN, William Dalton, Technician Referring MD:              Medicines:                Propofol per Anesthesia Complications:            No immediate complications. Estimated Blood Loss:     Estimated blood loss was minimal. Procedure:                Pre-Anesthesia Assessment:                           - Prior to the procedure, a History and Physical                            was performed, and patient medications and                            allergies were reviewed. The patient's tolerance of                            previous anesthesia was also reviewed. The risks                            and benefits of the procedure and the sedation                            options and risks were discussed with the patient.                            All questions were answered, and informed consent                            was obtained. Prior Anticoagulants: The patient has                            taken no previous anticoagulant or antiplatelet                            agents. ASA Grade Assessment: III - A patient with                            severe systemic disease. After reviewing the risks                            and benefits, the  patient was deemed in                            satisfactory condition to undergo the procedure.                           - Sedation was administered by an anesthesia                            professional. Deep sedation was attained.                           After obtaining informed consent, the colonoscope                            was passed under direct vision. Throughout the                             procedure, the patient's blood pressure, pulse, and                            oxygen saturations were monitored continuously. The                            [id number]-HQ190L ([id number] Olympus colonoscope was                            introduced through the anus and advanced to the the                            cecum, identified by appendiceal orifice and                            ileocecal valve. After obtaining informed consent,                            the colonoscope was passed under direct vision.                            Throughout the procedure, the patient's blood                            pressure, pulse, and oxygen saturations were                            monitored continuously. The colonoscopy was                            performed without difficulty. The patient tolerated                            the procedure well. The quality of the bowel                            preparation was adequate.  The ileocecal valve,                            appendiceal orifice, and rectum were photographed. Scope In: 12:11:01 PM Scope Out: 12:33:21 PM Scope Withdrawal Time: 0 hours 14 minutes 52 seconds  Total Procedure Duration: 0 hours 22 minutes 20 seconds  Findings:      A single small localized angiodysplastic lesion with bleeding was found       in the cecum. Coagulation for hemostasis using bipolar probe was       successful. To prevent bleeding post-intervention, three hemostatic       clips were successfully placed (MR unsafe). There was no bleeding at the       end of the procedure.      The source of bleeding was not identified. Initial inspection of the       distal colon did show some evidence of faint blood mixed with his stool,       but this current finding is not consistent with the reports of severe       hematochezia with blood clots. Advancing the colonoscope proximally, the       stool was normal in color and consistency. Cecal intubation was  easily       achieved and there was evidence of a cecal AVM. A gentle wash was       applied to obtain a better image and bleeding erupted. The patient       exhibited rapid oozing. Even though this lesion was bleeding, it does       not appear to be the source of his admission bleeding complains as the       surrounding stool was devoid of any blood. Because of the unprepped       colonoscopy, APC was not used to avoid any potential ignition of colonic       gases. A hot biopsy forcep was applied to the area and short bursts of       electrocautery were applied, but the bleeding persisted. BICAP was then       used and after several short applications the bleeding was arrested. To       prevent any further bleeding and to reduce any risk for perforation,       three hemoclips were successfully applied to the area.      ? hemorrhoidal bleeding source. Impression:               - A single bleeding colonic angiodysplastic lesion.                            Treated with bipolar cautery. Clips (MR unsafe)                            were placed.                           - No specimens collected. Moderate Sedation:      Not Applicable - Patient had care per Anesthesia. Recommendation:           - Return patient to hospital ward for ongoing care.                           -  Clear liquid diet.                           - Continue present medications.                           - Monitor HGB and transfuse as necessary.                           - Dr. Rhea Belton will round on the patient tomorrow. Procedure Code(s):        --- Professional ---                           [id number], Colonoscopy, flexible; with control of                            bleeding, any method Diagnosis Code(s):        --- Professional ---                           K55.21, Angiodysplasia of colon with hemorrhage                           K92.1, Melena (includes Hematochezia) CPT copyright 2019 [employer]. All  rights reserved. The codes documented in this report are preliminary and upon coder review may  be revised to meet current compliance requirements. Jeani Hawking, MD Jeani Hawking, MD 10/18/2020 12:48:05 PM This report has been signed electronically. Number of Addenda: 0

## 2020-10-18 NOTE — H&P (Signed)
History and Physical    [name] ZOX:[id number] DOB: 12-Dec-1960 DOA: 10/18/2020  PCP: Tracey Harries, MD   Patient coming from: home  Chief Complaint: blood with bowel movements  HPI: [name] is a 60 y.o. male with medical history significant for history of A. fib, COPD, CKD, diabetes presenting with complaint of rectal bleeding. Patient states that he had an umbilical hernia repair last month and he has had some soreness and discomfort since but seems to be healing.  Today he noticed bright red blood with bowel movement and he initially thought it was related to hemorrhoids. He had 3 more episodes of rectal bleeding, with the blood looking more and more dark and had clots.  Reports he had some bouts of brief sharp left lower quadrant abdominal pain lasting a few seconds and then resolving yesterday afternoon before the bleeding starting. He states he called his surgeon who told him he would be sore in lower abdomen for a few weeks. The bleeding episodes occurred after he called the surgeon.  Currently without pain.  Denies any dizziness, no chest pain or shortness of breath. He states he had a colonoscopy a year ago and was told it was normal.   ED Course:  Pt found to have lower Hgb of 11 from baseline of 13 a few weeks ago.  Hospitalist service was asked to observe patient here hemoglobin does not continue to drop.  Patient is hemodynamically stable  Review of Systems:  General: Denies weakness, fever, chills, weight loss, night sweats.  Denies dizziness.  Denies change in appetite HENT: Denies head trauma, headache, denies change in hearing, tinnitus.  Denies nasal congestion or bleeding.  Denies sore throat, sores in mouth.  Denies difficulty swallowing Eyes: Denies blurry vision, pain in eye, drainage.  Denies discoloration of eyes. Neck: Denies pain.  Denies swelling.  Denies pain with movement. Cardiovascular: Denies chest pain, palpitations. Denies edema. Denies  orthopnea Respiratory: Denies shortness of breath, cough.  Denies wheezing.  Denies sputum production Gastrointestinal: Reports blood per rectum. Denies abdominal pain, swelling.  Denies nausea, vomiting, diarrhea.  Denies hematemesis. Musculoskeletal: Denies limitation of movement.  Denies deformity or swelling.  Denies pain.  Denies arthralgias or myalgias. Genitourinary: Denies pelvic pain.  Denies urinary frequency or hesitancy.  Denies dysuria.  Skin: Denies rash.  Denies petechiae, purpura, ecchymosis. Neurological: Denies syncope.  Denies seizure activity.  Denies paresthesia.  Denies slurred speech, drooping face.  Denies visual change. Psychiatric: Denies depression, anxiety. Denies hallucinations.  Past Medical History:  Diagnosis Date  . Aneurysm (HCC)    brain  . Arthritis   . Atrial fibrillation (HCC)   . Bipolar disorder (HCC)    was on meds but was taken off 2 yrs ago and none since  . Burning pain    in both legs-seeing Dr.Sethi for this  . CAD (coronary artery disease)   . Cataracts, bilateral   . Childhood asthma    Last asthma attack at age 5; History of trach at 16 months (12/17/2016)  . Chronic kidney disease 2009  . Chronic lower back pain   . COPD (chronic obstructive pulmonary disease) (HCC) 06/2012   uses Spiriva and Albuterol daily as needed  . Depression   . Family history of adverse reaction to anesthesia    "Mom and sister have PONV"  . GERD (gastroesophageal reflux disease)   . H/O hiatal hernia   . Headache(784.0)    "q 2-3 weeks; daily last 2 wks" (12/17/2016)  .  Hyperlipidemia    takes Ramipril daily  . Hypertension    takes Ramipril daily  . Joint pain   . Joint swelling   . Middle cerebral artery aneurysm    right  . Nocturia   . Numbness    both arms   . Pneumonia    hx of-2014  . PONV (postoperative nausea and vomiting)    Pt reports nausea only.  . Sleep apnea    dx just this past week   08/21/2017  . Stroke [facility]) 2015   "drag left  foot more since; have to wear glasses now" (12/17/2016)  . Type II diabetes mellitus ([place])    takes Metformin daily   dx 2015  . Umbilical hernia   . Urinary frequency   . Wears dentures   . Wears glasses    and contact lenses    Past Surgical History:  Procedure Laterality Date  . ANEURYSM COILING  2015  . CARDIOVERSION N/A 12/18/2016   Procedure: CARDIOVERSION;  Surgeon: Dixie Dials, MD;  Location[facility] END[facility]COPY;  Service: Cardiovascular;  Laterality: N/A;  . COLONOSCOPY    . COLONOSCOPY N/A 04/08/2018   Procedure: COLONOSCOPY;  Surgeon: Carol Ada, MD;  Location: [facility] ENDOSCOPY;  Service: Endoscopy;  Laterality: N/A;  . ESOPHAGOGASTRODUODENOSCOPY N/A 04/08/2018   Procedure: ESOPHAGOGASTRODUODENOSCOPY (EGD);  Surgeon: Carol Ada, MD;  Location: [facility] ENDOSCOPY;  Service: Endoscopy;  Laterality: N/A;  . EXCISIONAL HEMORRHOIDECTOMY  1980s   "soon after rectal OR"  . GANGLION CYST EXCISION Right   . HOT HEMOSTASIS N/A 04/08/2018   Procedure: HOT HEMOSTASIS (ARGON PLASMA COAGULATION/BICAP);  Surgeon: Carol Ada, MD;  Location: [facility] ENDOSCOPY;  Service: Endoscopy;  Laterality: N/A;  . IR 3D INDEPENDENT WKST  05/10/2017  . IR 3D INDEPENDENT [facility]KST  06/28/2017  . IR ANGIO INTRA EXTRACRAN SEL COM CAROTID INNOMINATE BILAT MOD SED  04/22/2017  . IR ANGIO INTRA EXTRACRAN SEL COM CAROTID INNOMINATE BILAT MOD SED  11/22/2017  . IR ANGIO INTRA EXTRACRAN SEL COM CAROTID INNOMINATE BILAT MOD SED  05/13/2018  . IR ANGIO INTRA EXTRACRAN SEL INTERNAL CAROTID UNI L MOD SED  12/27/2017  . IR ANGIO INTRA EXTRACRAN SEL INTERNAL CAROTID UNI R MOD SED  05/10/2017  . IR ANGIO INTRA EXTRACRAN SEL INTERNAL CAROTID UNI R MOD SED  06/28/2017  . IR ANGIO VERTEBRAL SEL SUBCLAVIAN INNOMINATE UNI L MOD SED  04/22/2017  . IR ANGIO VERTEBRAL SEL SUBCLAVIAN INNOMINATE UNI R MOD SED  06/28/2017  . IR ANGIO VERTEBRAL SEL SUBCLAVIAN INNOMINATE UNI R MOD SED  11/22/2017  . IR ANGIO VERTEBRAL SEL SUBCLAVIAN INNOMINATE UNI R MOD SED   05/13/2018  . IR ANGIO VERTEBRAL SEL VERTEBRAL UNI R MOD SED  04/22/2017  . IR ANGIOGRAM FOLLOW UP STUDY  05/10/2017  . IR ANGIOGRAM FOLLOW UP STUDY  06/28/2017  . IR ANGIOGRAM FOLLOW UP STUDY  12/27/2017  . IR NEURO EACH ADD'L AFTER BASIC UNI LEFT (MS)  12/27/2017  . IR NEURO EACH ADD'L AFTER BASIC UNI RIGHT (MS)  05/10/2017  . IR NEURO EACH ADD'L AFTER BASIC UNI RIGHT (MS)  06/28/2017  . IR RADIOLOGIST EVAL & MGMT  04/30/2017  . IR RADIOLOGIST EVAL & MGMT  06/15/2017  . IR RADIOLOGIST EVAL & MGMT  07/13/2017  . IR RADIOLOGIST EVAL & MGMT  01/11/2018  . IR TRANSCATH/EMBOLIZ  05/10/2017  . IR TRANSCATH/EMBOLIZ  06/28/2017  . IR TRANSCATH/EMBOLIZ  12/27/2017  . MULTIPLE TOOTH EXTRACTIONS    . POLYPECTOMY  04/08/2018   Procedure:  POLYPECTOMY;  Surgeon: Jeani Hawking, MD;  Location: [facility] ENDOSCOPY;  Service: Endoscopy;;  . RADIOLOGY WITH ANESTHESIA N/A 08/08/2014   Procedure: RADIOLOGY WITH ANESTHESIA EMBOLIZATION;  Surgeon: Medication Radiologist, MD;  Location: [facility] OR;  Service: Radiology;  Laterality: N/A;  . RADIOLOGY WITH ANESTHESIA N/A 11/21/2014   Procedure: RADIOLOGY WITH ANESTHESIA;  Surgeon: Oneal Grout, MD;  Location: [facility] OR;  Service: Radiology;  Laterality: N/A;  . RADIOLOGY WITH ANESTHESIA N/A 02/27/2015   Procedure: RADIOLOGY WITH ANESTHESIA;  Surgeon: [name], MD;  Location: [facility] OR;  Service: Radiology;  Laterality: N/A;  . RADIOLOGY WITH ANESTHESIA N/A 09/25/2015   Procedure: RADIOLOGY WITH ANESTHESIA;  Surgeon: [name], MD;  Location: [facility] OR;  Service: Radiology;  Laterality: N/A;  . RADIOLOGY WITH ANESTHESIA N/A 05/10/2017   Procedure: EMBOLIZATION;  Surgeon: [name], MD;  Location: [facility] OR;  Service: Radiology;  Laterality: N/A;  . RADIOLOGY WITH ANESTHESIA N/A 06/28/2017   Procedure: RADIOLOGY WITH ANESTHESIA-EMBOLIZATION;  Surgeon: [name], MD;  Location: [facility] OR;  Service: Radiology;  Laterality: N/A;  . RADIOLOGY WITH ANESTHESIA N/A 08/26/2017    Procedure: RADIOLOGY WITH ANESTHESIA EMBOLIZATION;  Surgeon: [name], MD;  Location: [facility] OR;  Service: Radiology;  Laterality: N/A;  . RADIOLOGY WITH ANESTHESIA N/A 11/22/2017   Procedure: EMBOLIZATION;  Surgeon: [name], MD;  Location: [facility] OR;  Service: Radiology;  Laterality: N/A;  . RADIOLOGY WITH ANESTHESIA N/A 12/27/2017   Procedure: EMBOLIZATION;  Surgeon: [name], MD;  Location: [facility] OR;  Service: Radiology;  Laterality: N/A;  . RECTAL SURGERY  1980s   "tore it lifting heavy furniture; sewed it back together"  . SHOULDER ARTHROSCOPY WITH OPEN ROTATOR CUFF REPAIR Right 2016  . SHOULDER ARTHROSCOPY WITH ROTATOR CUFF REPAIR Left 1996  . SHOULDER OPEN ROTATOR CUFF REPAIR Left 1997   "took out 8inches of my collarbone"  . TEE WITHOUT CARDIOVERSION N/A 12/18/2016   Procedure: TRANSESOPHAGEAL ECHOCARDIOGRAM (TEE);  Surgeon: Orpah Cobb, MD;  Location: [facility] ENDOSCOPY;  Service: Cardiovascular;  Laterality: N/A;  . TRACHEOSTOMY  1964   at age 6 months old; for asthma  . TRACHEOSTOMY CLOSURE    . VENTRAL HERNIA REPAIR N/A 09/24/2020   Procedure: LAPAROSCOPIC VENTRAL WALL HERNIA REPAIR WITH MESH;  Surgeon: Karie Soda, MD;  Location: [facility] ORS;  Service: General;  Laterality: N/A;    Social History  reports that he quit smoking about 6 years ago. His smoking use included cigarettes. He started smoking about 45 years ago. He has a 37.50 pack-year smoking history. He has never used smokeless tobacco. He reports current alcohol use of about 3.0 standard drinks of alcohol per week. He reports that he does not use drugs.  Allergies  Allergen Reactions  . Tylox [Oxycodone-Acetaminophen] Hives, Other (See Comments) and Rash    Sweating, shaking Reaction: Tremors and diaphoresis   . Gabapentin     Gained weight and retained fluid   . Adhesive [Tape] Rash    States IV tape is fine    Family History  Problem Relation Age of Onset  . Heart disease Mother        s/p 3V  CABG  . Cancer Father 48       lung cancer; +tobacco  . Stroke Father   . Cancer Maternal Grandmother   . Heart disease Maternal Grandmother   . Cancer Paternal Grandmother   . Lupus Sister   . Multiple sclerosis Sister   . Anemia Daughter      Prior to Admission medications  Medication Sig Start Date End Date Taking? Authorizing Provider  albuterol (PROVENTIL HFA;VENTOLIN HFA) 108 (90 BASE) MCG/ACT inhaler Inhale 2 puffs into the lungs every 6 (six) hours as needed for wheezing or shortness of breath.    Yes [provider]  amiodarone (PACERONE) 200 MG tablet Take 100 mg by mouth daily.   Yes [provider]  apixaban (ELIQUIS) 5 MG TABS tablet Take 10 mg by mouth daily.   Yes [provider]  atorvastatin (LIPITOR) 20 MG tablet Take 20 mg by mouth at bedtime.    Yes [provider]  clopidogrel (PLAVIX) 75 MG tablet Take 1 tablet (75 mg total) by mouth daily. Patient taking differently: Take 37.5 mg by mouth daily. 12/28/17  Yes Louk, Alexandra M, PA-C  esomeprazole (NEXIUM) 40 MG capsule Take 40 mg by mouth daily as needed (Acid reflux).   Yes [provider]  furosemide (LASIX) 40 MG tablet Take 40 mg by mouth daily.   Yes [provider]  KLOR-CON M10 10 MEQ tablet Take 20 mEq by mouth daily. 08/22/20  Yes [provider]  metFORMIN (GLUCOPHAGE) 500 MG tablet Take 1 tablet (500 mg total) by mouth 2 (two) times daily with a meal. 07/06/14  Yes Rizwan, Ladell Heads, MD  nitroGLYCERIN (NITROSTAT) 0.4 MG SL tablet Place 1 tablet (0.4 mg total) under the tongue every 5 (five) minutes x 3 doses as needed for chest pain. 07/08/14  Yes Orpah Cobb, MD  ramipril (ALTACE) 5 MG capsule Take 5 mg by mouth daily. 08/22/20  Yes [provider]  traMADol (ULTRAM) 50 MG tablet Take 1-2 tablets (50-100 mg total) by mouth every 6 (six) hours as needed for moderate pain or severe pain. 09/24/20  Yes Karie Soda, MD   umeclidinium-vilanterol [facility] ELLIPTA) 62.5-25 MCG/INH AEPB Inhale 2 puffs into the lungs daily.   Yes [provider]    Physical Exam: Vitals:   10/18/20 0500 10/18/20 0515 10/18/20 0530 10/18/20 0545  BP:  (!) 121/98 (!) 117/52   Pulse: (!) 58 (!) 57 (!) 54 (!) 55  Resp: 16 18 15 20   Temp:      TempSrc:      SpO2: 98% 97% 96% 96%  Weight:      Height:        Constitutional: NAD, calm, comfortable Vitals:   10/18/20 0500 10/18/20 0515 10/18/20 0530 10/18/20 0545  BP:  (!) 121/98 (!) 117/52   Pulse: (!) 58 (!) 57 (!) 54 (!) 55  Resp: 16 18 15 20   Temp:      TempSrc:      SpO2: 98% 97% 96% 96%  Weight:      Height:       General: WDWN, Alert and oriented x3.  Eyes: EOMI, PERRL, conjunctivae normal.  Sclera nonicteric HENT:  [place]/AT, external ears normal.  Nares patent without epistasis.  Mucous membranes are moist.  Neck: Soft, normal range of motion, supple, no masses, no thyromegaly.  Trachea midline Respiratory: clear to auscultation bilaterally, no wheezing, no crackles. Normal respiratory effort. No accessory muscle use.  Cardiovascular: Regular rate and rhythm, no murmurs / rubs / gallops. No extremity edema.  Abdomen: Soft, no tenderness, nondistended, no rebound or guarding. Obese. No masses palpated. Bowel sounds normoactive Musculoskeletal: FROM. no cyanosis. Normal muscle tone.  Skin: Warm, dry, intact no rashes, lesions, ulcers. No induration Neurologic: CN 2-12 grossly intact.  Normal speech.  Sensation intact  Psychiatric: Normal judgment and insight.  Normal mood.  Labs on Admission: I have personally reviewed following labs and imaging studies  CBC: Recent Labs  Lab 10/17/20 2126  WBC 7.2  NEUTROABS 4.1  HGB 11.4*  HCT 34.6*  MCV 88.7  PLT 291    Basic Metabolic Panel: Recent Labs  Lab 10/17/20 2126  NA 138  K 3.8  CL 104  CO2 23  GLUCOSE 136*  BUN 14  CREATININE 0.98  CALCIUM 9.0    GFR: Estimated Creatinine Clearance:  101 mL/min (by C-G formula based on SCr of 0.98 mg/dL).  Liver Function Tests: Recent Labs  Lab 10/17/20 2126  AST 18  ALT 15  ALKPHOS 61  BILITOT 0.4  PROT 6.7  ALBUMIN 3.7    Urine analysis:    Component Value Date/Time   COLORURINE AMBER (A) 07/07/2014 2134   APPEARANCEUR CLEAR 07/07/2014 2134   LABSPEC 1.031 (H) 07/07/2014 2134   PHURINE 5.5 07/07/2014 2134   GLUCOSEU NEGATIVE 07/07/2014 2134   HGBUR NEGATIVE 07/07/2014 2134   BILIRUBINUR NEGATIVE 07/07/2014 2134   KETONESUR NEGATIVE 07/07/2014 2134   PROTEINUR NEGATIVE 07/07/2014 2134   UROBILINOGEN 1.0 07/07/2014 2134   NITRITE NEGATIVE 07/07/2014 2134   LEUKOCYTESUR NEGATIVE 07/07/2014 2134    Radiological Exams on Admission: No results found.  EKG: Independently reviewed.  EKG shows normal sinus rhythm with right bundle branch block and left anterior fascicular block.  No acute ST elevation or depression.  QTc 489  Assessment/Plan Principal Problem:   GI bleeding Mr. Shawnee KnappLevy is placed on Med-Surg floor for observation.  Check serial Hgb/Hct level Consult GI for evaluation with multiple episodes of blood per rectum.   Active Problems:   COPD (chronic obstructive pulmonary disease)  Continue home medication of Anoro and albuterol MDI     DM type 2 causing vascular disease  Continue metformin. Diabetes well controlled with last HgbA1c in Dec. 2021 that was 5.6.     Hyperlipidemia LDL goal <100 Heart healthy diet. Continue lipitor for statin therapy.     Morbid obesity  Follow up with PCP for dietary, lifestyle, pharmacotherapy interventions and possible bariatric surgery referral for weight loss.    OSA on CPAP CPAP at night as he uses at home.    DVT prophylaxis: SCDs for DVT prophylaxis.  Anticoagulation is held with active GI bleeding Code Status:   Full code Family Communication:  Diagnosis and plan discussed with patient.  Questions answered.  Further recommendations to follow as clinically  indicated Disposition Plan:   Patient is from:  Home  Anticipated DC to:  Home  Anticipated DC date:  Anticipate less than two midnight stay in the hospital  Anticipated DC barriers: No barriers to discharge identified at this time  Consults called:  Gastroenterology, Dr. Nonie HoyerMagod-secure message sent to provider Admission status:  Observation  Claudean SeveranceBradley S Chotiner MD [facility]  How to contact the [facility] Attending or Consulting provider 7A - 7P or covering provider during after hours 7P -7A, for this patient?   1. Check the care team in [facility] and look for a) attending/consulting [facility] provider listed and b) the [facility] team listed 2. Log into [email or web address] and use [place]'s universal password to access. If you do not have the password, please contact the hospital operator. 3. Locate the [facility] provider you are looking for under [facility] and page to a number that you can be directly reached. 4. If you still have difficulty reaching the provider, please page the [facility] (Director on Call) for the Hospitalists listed on  amion for assistance.  10/18/2020, 5:59 AM

## 2020-10-18 NOTE — Assessment & Plan Note (Signed)
-  Ongoing lifestyle modifications

## 2020-10-18 NOTE — Assessment & Plan Note (Signed)
-  Continue nightly CPAP °

## 2020-10-18 NOTE — Assessment & Plan Note (Signed)
-  No signs or symptoms of exacerbation -Continue home inhalers

## 2020-10-18 NOTE — ED Notes (Signed)
Pt resting in bed. NAD noted. Pt updated on plan of care. Pt has no complaints or concerns

## 2020-10-18 NOTE — ED Provider Notes (Signed)
[facility] DEPT [facility] Emergency Department Provider Note MRN:  [id number]  Arrival date & time: 10/18/20     Chief Complaint   Rectal Bleeding   History of Present Illness   [name] is a 60 y.o. year-old male with a history of A. fib, COPD, CKD, diabetes presenting to the ED with chief complaint of rectal bleeding.  Patient explains that he had a hernia repair last month, has had some soreness and discomfort since but seems to be healing.  Today he noticed bright red blood with bowel movement, thought it was related to hemorrhoids.  Had 3 more episodes of rectal bleeding, with the blood looking more and more dark, with clots.  Endorsing brief sharp left lower quadrant abdominal pain lasting a few seconds and then resolving today.  Currently without pain.  Denies any dizziness, no chest pain or shortness of breath.  Review of Systems  A complete 10 system review of systems was obtained and all systems are negative except as noted in the HPI and PMH.   Patient's Health History    Past Medical History:  Diagnosis Date  . Aneurysm (HCC)    brain  . Arthritis   . Atrial fibrillation (HCC)   . Bipolar disorder (HCC)    was on meds but was taken off 2 yrs ago and none since  . Burning pain    in both legs-seeing Dr.Sethi for this  . CAD (coronary artery disease)   . Cataracts, bilateral   . Childhood asthma    Last asthma attack at age 56; History of trach at 16 months (12/17/2016)  . Chronic kidney disease 2009  . Chronic lower back pain   . COPD (chronic obstructive pulmonary disease) (HCC) 06/2012   uses Spiriva and Albuterol daily as needed  . Depression   . Family history of adverse reaction to anesthesia    "Mom and sister have PONV"  . GERD (gastroesophageal reflux disease)   . H/O hiatal hernia   . Headache(784.0)    "q 2-3 weeks; daily last 2 wks" (12/17/2016)  . Hyperlipidemia    takes Ramipril daily  . Hypertension    takes Ramipril daily  . Joint  pain   . Joint swelling   . Middle cerebral artery aneurysm    right  . Nocturia   . Numbness    both arms   . Pneumonia    hx of-2014  . PONV (postoperative nausea and vomiting)    Pt reports nausea only.  . Sleep apnea    dx just this past week   08/21/2017  . Stroke [facility]) 2015   "drag left foot more since; have to wear glasses now" (12/17/2016)  . Type II diabetes mellitus (HCC)    takes Metformin daily   dx 2015  . Umbilical hernia   . Urinary frequency   . Wears dentures   . Wears glasses    and contact lenses    Past Surgical History:  Procedure Laterality Date  . ANEURYSM COILING  2015  . CARDIOVERSION N/A 12/18/2016   Procedure: CARDIOVERSION;  Surgeon: Orpah Cobb, MD;  Location: [facility] ENDOSCOPY;  Service: Cardiovascular;  Laterality: N/A;  . COLONOSCOPY    . COLONOSCOPY N/A 04/08/2018   Procedure: COLONOSCOPY;  Surgeon: Jeani Hawking, MD;  [facility]OSCOPY;  Service: Endoscopy;  Laterality: N/A;  . ESOPHAGOGASTRODUODENOSCOPY N/A 04/08/2018   Procedure: ESOPHAGOGASTRODUODENOSCOPY (EGD);  Surgeon: Jeani Hawking, MD;  Location: [facility] ENDOSCOPY;  Service: Endoscopy;  Laterality: N/A;  .  EXCISIONAL HEMORRHOIDECTOMY  1980s   "soon after rectal OR"  . GANGLION CYST EXCISION Right   . HOT HEMOSTASIS N/A 04/08/2018   Procedure: HOT HEMOSTASIS (ARGON PLASMA COAGULATION/BICAP);  Surgeon: Jeani Hawking, MD;  Location: [facility] ENDOSCOPY;  Service: Endoscopy;  Laterality: N/A;  . IR 3D INDEPENDENT WKST  05/10/2017  . IR 3D INDEPENDENT WKST  06/28/2017  . IR ANGIO INTRA EXTRACRAN SEL COM CAROTID INNOMINATE BILAT MOD SED  04/22/2017  . IR ANGIO INTRA EXTRACRAN SEL COM CAROTID INNOMINATE BILAT MOD SED  11/22/2017  . IR ANGIO INTRA EXTRACRAN SEL COM CAROTID INNOMINATE BILAT MOD SED  05/13/2018  . IR ANGIO INTRA EXTRACRAN SEL INTERNAL CAROTID UNI L MOD SED  12/27/2017  . IR ANGIO INTRA EXTRACRAN SEL INTERNAL CAROTID UNI R MOD SED  05/10/2017  . IR ANGIO INTRA EXTRACRAN SEL INTERNAL CAROTID UNI R MOD  SED  06/28/2017  . IR ANGIO VERTEBRAL SEL SUBCLAVIAN INNOMINATE UNI L MOD SED  04/22/2017  . IR ANGIO VERTEBRAL SEL SUBCLAVIAN INNOMINATE UNI R MOD SED  06/28/2017  . IR ANGIO VERTEBRAL SEL SUBCLAVIAN INNOMINATE UNI R MOD SED  11/22/2017  . IR ANGIO VERTEBRAL SEL SUBCLAVIAN INNOMINATE UNI R MOD SED  05/13/2018  . IR ANGIO VERTEBRAL SEL VERTEBRAL UNI R MOD SED  04/22/2017  . IR ANGIOGRAM FOLLOW UP STUDY  05/10/2017  . IR ANGIOGRAM FOLLOW UP STUDY  06/28/2017  . IR ANGIOGRAM FOLLOW UP STUDY  12/27/2017  . IR NEURO EACH ADD'L AFTER BASIC UNI LEFT (MS)  12/27/2017  . IR NEURO EACH ADD'L AFTER BASIC UNI RIGHT (MS)  05/10/2017  . IR NEURO EACH ADD'L AFTER BASIC UNI RIGHT (MS)  06/28/2017  . IR RADIOLOGIST EVAL & MGMT  04/30/2017  . IR RADIOLOGIST EVAL & MGMT  06/15/2017  . IR RADIOLOGIST EVAL & MGMT  07/13/2017  . IR RADIOLOGIST EVAL & MGMT  01/11/2018  . IR TRANSCATH/EMBOLIZ  05/10/2017  . IR TRANSCATH/EMBOLIZ  06/28/2017  . IR TRANSCATH/EMBOLIZ  12/27/2017  . MULTIPLE TOOTH EXTRACTIONS    . POLYPECTOMY  04/08/2018   Procedure: POLYPECTOMY;  Surgeon: Jeani Hawking, MD;  [facility];  Service: Endoscopy;;  . RADIOLOGY WITH ANESTHESIA N/A 08/08/2014   Procedure: RADIOLOGY WITH ANESTHESIA EMBOLIZATION;  Surgeon: Medication Radiologist, MD;  [facility];  Service: Radiology;  Laterality: N/A;  . RADIOLOGY WITH ANESTHESIA N/A 11/21/2014   Procedure: RADIOLOGY WITH ANESTHESIA;  Surgeon: Oneal Grout, MD;  [facility];  Service: Radiology;  Laterality: N/A;  . RADIOLOGY WITH ANESTHESIA N/A 02/27/2015   Procedure: RADIOLOGY WITH ANESTHESIA;  Surgeon: [name], MD;  [facility];  Service: Radiology;  Laterality: N/A;  . RADIOLOGY WITH ANESTHESIA N/A 09/25/2015   Procedure: RADIOLOGY WITH ANESTHESIA;  Surgeon: [name], MD;  [facility];  Service: Radiology;  Laterality: N/A;  . RADIOLOGY WITH ANESTHESIA N/A 05/10/2017   Procedure: EMBOLIZATION;  Surgeon: [name],  MD;  [facility];  Service: Radiology;  Laterality: N/A;  . RADIOLOGY WITH ANESTHESIA N/A 06/28/2017   Procedure: RADIOLOGY WITH ANESTHESIA-EMBOLIZATION;  Surgeon: [name], MD;  [facility];  Service: Radiology;  Laterality: N/A;  . RADIOLOGY WITH ANESTHESIA N/A 08/26/2017   Procedure: RADIOLOGY WITH ANESTHESIA EMBOLIZATION;  Surgeon: [name], MD;  [facility];  Service: Radiology;  Laterality: N/A;  . RADIOLOGY WITH ANESTHESIA N/A 11/22/2017   Procedure: EMBOLIZATION;  Surgeon: [name], MD;  [facility];  Service: Radiology;  Laterality: N/A;  . RADIOLOGY WITH ANESTHESIA N/A  12/27/2017   Procedure: EMBOLIZATION;  Surgeon: [name], MD;  [facility];  Service: Radiology;  Laterality: N/A;  . RECTAL SURGERY  1980s   "tore it lifting heavy furniture; sewed it back together"  . SHOULDER ARTHROSCOPY WITH OPEN ROTATOR CUFF REPAIR Right 2016  . SHOULDER ARTHROSCOPY WITH ROTATOR CUFF REPAIR Left 1996  . SHOULDER OPEN ROTATOR CUFF REPAIR Left 1997   "took out 8inches of my collarbone"  . TEE WITHOUT CARDIOVERSION N/A 12/18/2016   Procedure: TRANSESOPHAGEAL ECHOCARDIOGRAM (TEE);  Surgeon: Orpah Cobb, MD;  Location: [facility] ENDOSCOPY;  Service: Cardiovascular;  Laterality: N/A;  . TRACHEOSTOMY  1964   at age 88 months old; for asthma  . TRACHEOSTOMY CLOSURE    . VENTRAL HERNIA REPAIR N/A 09/24/2020   Procedure: LAPAROSCOPIC VENTRAL WALL HERNIA REPAIR WITH MESH;  Surgeon: Karie Soda, MD;  [facility] ORS;  Service: General;  Laterality: N/A;    Family History  Problem Relation Age of Onset  . Heart disease Mother        s/p 3V CABG  . Cancer Father 56       lung cancer; +tobacco  . Stroke Father   . Cancer Maternal Grandmother   . Heart disease Maternal Grandmother   . Cancer Paternal Grandmother   . Lupus Sister   . Multiple sclerosis Sister   . Anemia Daughter     Social History   Socioeconomic History  . Marital status:  Divorced    Spouse name: not together since 2007  . Number of children: 3  . Years of education: 10  . Highest education level: Not on file  Occupational History  . 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 Smoking status: Former Smoker    Packs/day: 1.50    Years: 25.00    Pack years: 37.50    Types: Cigarettes    Start date: 01/15/1975    Quit date: 06/02/2014    Years since quitting: 6.3  . Smokeless tobacco: Never Used  Vaping Use  . Vaping Use: Former  Substance and Sexual Activity  . Alcohol use: Yes    Alcohol/week: 3.0 standard drinks    Types: 3 Cans of beer per week    Comment: drinks 3 - 12oz bottles every monday, during shooting pool  . Drug use: No  . Sexual activity: Never    Partners: Female    Comment: partner has had surgery to prevent pregnancy  Other Topics Concern  . Not on file  Social History Narrative   Lives with his son and his mother.  His son has no contact with the son's mother, though the patient is not legally separated from her.  She has a history of drug use and has been in prison several times.   Social Determinants of Health   Financial Resource Strain: Not on file  Food Insecurity: Not on file  Transportation Needs: Not on file  Physical Activity: Not on file  Stress: Not on file  Social Connections: Not on file  Intimate Partner Violence: Not on file     Physical Exam   Vitals:   10/18/20 0445 10/18/20 0500  BP: (!) 138/124   Pulse: (!) 57 (!) 58  Resp: 16 16  Temp:    SpO2: 97% 98%    CONSTITUTIONAL: Well-appearing, NAD NEURO:  Alert and oriented x 3, no focal deficits EYES:  eyes equal and reactive ENT/NECK:  no LAD, no  JVD CARDIO: Regular rate, well-perfused, normal S1 and S2 PULM:  CTAB no wheezing or rhonchi GI/GU:  normal bowel sounds, non-distended, non-tender MSK/SPINE:  No gross deformities, no edema SKIN:  no rash, atraumatic PSYCH:  Appropriate speech and  behavior  *Additional and/or pertinent findings included in MDM below  Diagnostic and Interventional Summary    EKG Interpretation  Date/Time:  Friday October 18 2020 03:47:32 EST Ventricular Rate:  58 PR Interval:    QRS Duration: 164 QT Interval:  497 QTC Calculation: 489 R Axis:   -43 Text Interpretation: Sinus rhythm RBBB and LAFB Confirmed by Gerlene Fee [phone]) on 10/18/2020 5:06:35 AM      Labs Reviewed  CBC WITH DIFFERENTIAL/PLATELET - Abnormal; Notable for the following components:      Result Value   RBC 3.90 (*)    Hemoglobin 11.4 (*)    HCT 34.6 (*)    All other components within normal limits  COMPREHENSIVE METABOLIC PANEL - Abnormal; Notable for the following components:   Glucose, Bld 136 (*)    All other components within normal limits  POC OCCULT BLOOD, ED - Abnormal; Notable for the following components:   Fecal Occult Bld POSITIVE (*)    All other components within normal limits  RESP PANEL BY RT-PCR (FLU A&B, COVID) ARPGX2  PROTIME-INR  APTT  TYPE AND SCREEN    No orders to display    Medications  sodium chloride 0.9 % bolus 1,000 mL (1,000 mLs Intravenous New Bag/Given 10/18/20 0516)     Procedures  /  Critical Care Procedures  ED Course and Medical Decision Making  I have reviewed the triage vital signs, the nursing notes, and pertinent available records from the EMR.  Listed above are laboratory and imaging tests that I personally ordered, reviewed, and interpreted and then considered in my medical decision making (see below for details).  Anticoagulated with rectal bleeding, Hemoccult positive, abdominal exam at this time is very reassuring, soft, nontender.  Will request admission by hospitalist service.       Barth Kirks. Sedonia Small, [place] [email or web address]  Final Clinical Impressions(s) / ED Diagnoses     ICD-10-CM   1. Rectal bleeding  K62.5   2. Anticoagulated  Z79.01     ED  Discharge Orders    None       Discharge Instructions Discussed with and Provided to Patient:   Discharge Instructions   None       Maudie Flakes, MD 10/18/20 [phone]

## 2020-10-18 NOTE — Consult Note (Signed)
Reason for Consult: Hematochezia and lower abdominal pain Referring Physician: [facility]  [name] HPI: This is a 60 year old male with multiple medical problems admitted for complaints of lower abdominal pain, hematochezia, and anemia.  He states that his symptoms started acutely yesterday.  He was experiencing some lower midline abdominal pain that he was attributing to his recent umbilical hernia repair.  The patient went home early from work and then at 2:45 PM he had a bout of hematochezia.  A second and third bout occurred 45 minutes later and then at 6:30 PM.  After the third episode he presented to the ER and a final hematochezia event occurred at 10 PM.  His HGB declined from 13.4 g/dL down to 10.5 g/dL.  He denies any problems with chest pain or SOB.  In 2019 he complained about some hematochezia and a colonoscopy was performed.  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  There was no evidence of any diverticula.  Past Medical History:  Diagnosis Date  . Aneurysm ([place])    brain  . Arthritis   . Atrial fibrillation ([place])   . Bipolar disorder ([place])    was on meds but was taken off 2 yrs ago and none since  . Burning pain    in both legs-seeing Dr.Sethi for this  . CAD (coronary artery disease)   . Cataracts, bilateral   . Childhood asthma    Last asthma attack at age 79; History of trach at 16 months (12/17/2016)  . Chronic kidney disease 2009  . Chronic lower back pain   . COPD (chronic obstructive pulmonary disease) ([place]) 06/2012   uses Spiriva and Albuterol daily as needed  . Depression   . Family history of adverse reaction to anesthesia    "Mom and sister have PONV"  . GERD (gastroesophageal reflux disease)   . H/O hiatal hernia   . Headache(784.0)    "q 2-3 weeks; daily last 2 wks" (12/17/2016)  . Hyperlipidemia    takes Ramipril daily  . Hypertension    takes Ramipril daily  . Joint pain   . Joint swelling   . Middle cerebral artery  aneurysm    right  . Nocturia   . Numbness    both arms   . Pneumonia    hx of-2014  . PONV (postoperative nausea and vomiting)    Pt reports nausea only.  . Sleep apnea    dx just this past week   08/21/2017  . Stroke [facility]) 2015   "drag left foot more since; have to wear glasses now" (12/17/2016)  . Type II diabetes mellitus ([place])    takes Metformin daily   dx 2015  . Umbilical hernia   . Urinary frequency   . Wears dentures   . Wears glasses    and contact lenses    Past Surgical History:  Procedure Laterality Date  . ANEURYSM COILING  2015  . CARDIOVERSION N/A 12/18/2016   Procedure: CARDIOVERSION;  Surgeon: Dixie Dials, MD;  Location: [facility] ENDOSCOPY;  Service: Cardiovascular;  Laterality: N/A;  . COLONOSCOPY    . COLONOSCOPY N/A 04/08/2018   Procedure: COLONOSCOPY;  Surgeon: Carol Ada, MD;  [facility] ENDOSCOPY;  Service: Endoscopy;  Laterality: N/A;  . ESOPHAGOGASTRODUODENOSCOPY N/A 04/08/2018   Procedure: ESOPHAGOGASTRODUODENOSCOPY (EGD);  Surgeon: Carol Ada, MD;  Location: [facility] ENDOSCOPY;  Service: Endoscopy;  Laterality: N/A;  . EXCISIONAL HEMORRHOIDECTOMY  1980s   "soon after rectal OR"  . GANGLION CYST  EXCISION Right   . HOT HEMOSTASIS N/A 04/08/2018   Procedure: HOT HEMOSTASIS (ARGON PLASMA COAGULATION/BICAP);  Surgeon: Jeani Hawking, MD;  Location: [facility] ENDOSCOPY;  Service: Endoscopy;  Laterality: N/A;  . IR 3D INDEPENDENT WKST  05/10/2017  . IR 3D INDEPENDENT WKST  06/28/2017  . IR ANGIO INTRA EXTRACRAN SEL COM CAROTID INNOMINATE BILAT MOD SED  04/22/2017  . IR ANGIO INTRA EXTRACRAN SEL COM CAROTID INNOMINATE BILAT MOD SED  11/22/2017  . IR ANGIO INTRA EXTRACRAN SEL COM CAROTID INNOMINATE BILAT MOD SED  05/13/2018  . IR ANGIO INTRA EXTRACRAN SEL INTERNAL CAROTID UNI L MOD SED  12/27/2017  . IR ANGIO INTRA EXTRACRAN SEL INTERNAL CAROTID UNI R MOD SED  05/10/2017  . IR ANGIO INTRA EXTRACRAN SEL INTERNAL CAROTID UNI R MOD SED  06/28/2017  . IR ANGIO VERTEBRAL SEL  SUBCLAVIAN INNOMINATE UNI L MOD SED  04/22/2017  . IR ANGIO VERTEBRAL SEL SUBCLAVIAN INNOMINATE UNI R MOD SED  06/28/2017  . IR ANGIO VERTEBRAL SEL SUBCLAVIAN INNOMINATE UNI R MOD SED  11/22/2017  . IR ANGIO VERTEBRAL SEL SUBCLAVIAN INNOMINATE UNI R MOD SED  05/13/2018  . IR ANGIO VERTEBRAL SEL VERTEBRAL UNI R MOD SED  04/22/2017  . IR ANGIOGRAM FOLLOW UP STUDY  05/10/2017  . IR ANGIOGRAM FOLLOW UP STUDY  06/28/2017  . IR ANGIOGRAM FOLLOW UP STUDY  12/27/2017  . IR NEURO EACH ADD'L AFTER BASIC UNI LEFT (MS)  12/27/2017  . IR NEURO EACH ADD'L AFTER BASIC UNI RIGHT (MS)  05/10/2017  . IR NEURO EACH ADD'L AFTER BASIC UNI RIGHT (MS)  06/28/2017  . IR RADIOLOGIST EVAL & MGMT  04/30/2017  . IR RADIOLOGIST EVAL & MGMT  06/15/2017  . IR RADIOLOGIST EVAL & MGMT  07/13/2017  . IR RADIOLOGIST EVAL & MGMT  01/11/2018  . IR TRANSCATH/EMBOLIZ  05/10/2017  . IR TRANSCATH/EMBOLIZ  06/28/2017  . IR TRANSCATH/EMBOLIZ  12/27/2017  . MULTIPLE TOOTH EXTRACTIONS    . POLYPECTOMY  04/08/2018   Procedure: POLYPECTOMY;  Surgeon: Jeani Hawking, MD;  [facility];  Service: Endoscopy;;  . RADIOLOGY WITH ANESTHESIA N/A 08/08/2014   Procedure: RADIOLOGY WITH ANESTHESIA EMBOLIZATION;  Surgeon: Medication Radiologist, MD;  [facility];  Service: Radiology;  Laterality: N/A;  . RADIOLOGY WITH ANESTHESIA N/A 11/21/2014   Procedure: RADIOLOGY WITH ANESTHESIA;  Surgeon: Oneal Grout, MD;  [facility];  Service: Radiology;  Laterality: N/A;  . RADIOLOGY WITH ANESTHESIA N/A 02/27/2015   Procedure: RADIOLOGY WITH ANESTHESIA;  Surgeon: [name], MD;  [facility];  Service: Radiology;  Laterality: N/A;  . RADIOLOGY WITH ANESTHESIA N/A 09/25/2015   Procedure: RADIOLOGY WITH ANESTHESIA;  Surgeon: [name], MD;  [facility];  Service: Radiology;  Laterality: N/A;  . RADIOLOGY WITH ANESTHESIA N/A 05/10/2017   Procedure: EMBOLIZATION;  Surgeon: [name], MD;  [facility];  Service:  Radiology;  Laterality: N/A;  . RADIOLOGY WITH ANESTHESIA N/A 06/28/2017   Procedure: RADIOLOGY WITH ANESTHESIA-EMBOLIZATION;  Surgeon: [name], MD;  [facility];  Service: Radiology;  Laterality: N/A;  . RADIOLOGY WITH ANESTHESIA N/A 08/26/2017   Procedure: RADIOLOGY WITH ANESTHESIA EMBOLIZATION;  Surgeon: [name], MD;  [facility];  Service: Radiology;  Laterality: N/A;  . RADIOLOGY WITH ANESTHESIA N/A 11/22/2017   Procedure: EMBOLIZATION;  Surgeon: [name], MD;  [facility];  Service: Radiology;  Laterality: N/A;  . RADIOLOGY WITH ANESTHESIA N/A 12/27/2017   Procedure: EMBOLIZATION;  Surgeon: [name], MD;  [facility];  Service: Radiology;  Laterality: N/A;  . RECTAL SURGERY  1980s   "tore it lifting heavy furniture; sewed it back together"  . SHOULDER ARTHROSCOPY WITH OPEN ROTATOR CUFF REPAIR Right 2016  . SHOULDER ARTHROSCOPY WITH ROTATOR CUFF REPAIR Left 1996  . SHOULDER OPEN ROTATOR CUFF REPAIR Left 1997   "took out 8inches of my collarbone"  . TEE WITHOUT CARDIOVERSION N/A 12/18/2016   Procedure: TRANSESOPHAGEAL ECHOCARDIOGRAM (TEE);  Surgeon: Orpah Cobb, MD;  Location: [facility] ENDOSCOPY;  Service: Cardiovascular;  Laterality: N/A;  . TRACHEOSTOMY  1964   at age 41 months old; for asthma  . TRACHEOSTOMY CLOSURE    . VENTRAL HERNIA REPAIR N/A 09/24/2020   Procedure: LAPAROSCOPIC VENTRAL WALL HERNIA REPAIR WITH MESH;  Surgeon: Karie Soda, MD;  [facility] ORS;  Service: General;  Laterality: N/A;    Family History  Problem Relation Age of Onset  . Heart disease Mother        s/p 3V CABG  . Cancer Father 52       lung cancer; +tobacco  . Stroke Father   . Cancer Maternal Grandmother   . Heart disease Maternal Grandmother   . Cancer Paternal Grandmother   . Lupus Sister   . Multiple sclerosis Sister   . Anemia Daughter     Social History:  reports that he quit smoking about 6 years ago. His smoking use included  cigarettes. He started smoking about 45 years ago. He has a 37.50 pack-year smoking history. He has never used smokeless tobacco. He reports current alcohol use of about 3.0 standard drinks of alcohol per week. He reports that he does not use drugs.  Allergies:  Allergies  Allergen Reactions  . Tylox [Oxycodone-Acetaminophen] Hives, Other (See Comments) and Rash    Sweating, shaking Reaction: Tremors and diaphoresis   . Gabapentin     Gained weight and retained fluid   . Adhesive [Tape] Rash    States IV tape is fine    Medications:  Scheduled: . amiodarone  100 mg Oral Daily  . atorvastatin  20 mg Oral QHS  . furosemide  40 mg Oral Daily  . metFORMIN  500 mg Oral BID WC  . pantoprazole  40 mg Oral Daily  . potassium chloride  20 mEq Oral Daily  . ramipril  5 mg Oral Daily  . umeclidinium-vilanterol  1 puff Inhalation Daily   Continuous: . lactated ringers 100 mL/hr at 10/18/20 2585    Results for orders placed or performed during the hospital encounter of 10/18/20 (from the past 24 hour(s))  CBC with Differential     Status: Abnormal   Collection Time: 10/17/20  9:26 PM  Result Value Ref Range   WBC 7.2 4.0 - 10.5 K/uL   RBC 3.90 (L) 4.22 - 5.81 MIL/uL   Hemoglobin 11.4 (L) 13.0 - 17.0 g/dL   HCT 27.7 (L) 82.4 - 23.5 %   MCV 88.7 80.0 - 100.0 fL   MCH 29.2 26.0 - 34.0 pg   MCHC 32.9 30.0 - 36.0 g/dL   RDW 36.1 44.3 - 15.4 %   Platelets 291 150 - 400 K/uL   nRBC 0.0 0.0 - 0.2 %   Neutrophils Relative % 57 %   Neutro Abs 4.1 1.7 - 7.7 K/uL   Lymphocytes Relative 29 %   Lymphs Abs 2.1 0.7 - 4.0 K/uL   Monocytes Relative 9 %   Monocytes Absolute 0.6 0.1 - 1.0 K/uL   Eosinophils Relative 5 %   Eosinophils Absolute  0.3 0.0 - 0.5 K/uL   Basophils Relative 0 %   Basophils Absolute 0.0 0.0 - 0.1 K/uL   Immature Granulocytes 0 %   Abs Immature Granulocytes 0.02 0.00 - 0.07 K/uL  Comprehensive metabolic panel     Status: Abnormal   Collection Time: 10/17/20  9:26 PM   Result Value Ref Range   Sodium 138 135 - 145 mmol/L   Potassium 3.8 3.5 - 5.1 mmol/L   Chloride 104 98 - 111 mmol/L   CO2 23 22 - 32 mmol/L   Glucose, Bld 136 (H) 70 - 99 mg/dL   BUN 14 6 - 20 mg/dL   Creatinine, Ser 6.07 0.61 - 1.24 mg/dL   Calcium 9.0 8.9 - 37.1 mg/dL   Total Protein 6.7 6.5 - 8.1 g/dL   Albumin 3.7 3.5 - 5.0 g/dL   AST 18 15 - 41 U/L   ALT 15 0 - 44 U/L   Alkaline Phosphatase 61 38 - 126 U/L   Total Bilirubin 0.4 0.3 - 1.2 mg/dL   GFR, Estimated >06 >26 mL/min   Anion gap 11 5 - 15  POC occult blood, ED     Status: Abnormal   Collection Time: 10/18/20  4:03 AM  Result Value Ref Range   Fecal Occult Bld POSITIVE (A) NEGATIVE  Resp Panel by RT-PCR (Flu A&B, Covid) Nasopharyngeal Swab     Status: None   Collection Time: 10/18/20  4:54 AM   Specimen: Nasopharyngeal Swab; Nasopharyngeal(NP) swabs in vial transport medium  Result Value Ref Range   SARS Coronavirus 2 by RT PCR NEGATIVE NEGATIVE   Influenza A by PCR NEGATIVE NEGATIVE   Influenza B by PCR NEGATIVE NEGATIVE  Protime-INR     Status: None   Collection Time: 10/18/20  5:10 AM  Result Value Ref Range   Prothrombin Time 13.4 11.4 - 15.2 seconds   INR 1.1 0.8 - 1.2  APTT     Status: Abnormal   Collection Time: 10/18/20  5:10 AM  Result Value Ref Range   aPTT 38 (H) 24 - 36 seconds  Type and screen [facility]     Status: None   Collection Time: 10/18/20  5:10 AM  Result Value Ref Range   ABO/RH(D) A POS    Antibody Screen NEG    Sample Expiration      10/21/2020,2359 Performed at [facility], [address], [place], [place] [address]   Hemoglobin and hematocrit, blood     Status: Abnormal   Collection Time: 10/18/20  9:11 AM  Result Value Ref Range   Hemoglobin 10.5 (L) 13.0 - 17.0 g/dL   HCT 62.7 (L) 03.5 - 00.9 %     No results found.  ROS:  As stated above in the HPI otherwise negative.  Blood pressure 130/78, pulse (!) 52, temperature 98.1 F  (36.7 C), temperature source Oral, resp. rate 16, height 5\' 8"  (1.727 m), weight 114.6 kg, SpO2 97 %.    PE: Gen: NAD, Alert and Oriented HEENT:  [place]/AT, EOMI Neck: Supple, no LAD Lungs: CTA Bilaterally CV: RRR without M/G/R ABD: Soft, mild infraumbilical tenderness, +BS Ext: No C/C/E  Assessment/Plan: 1) ? Ischemic colitis. 2) Hematochezia. 3) Anemia.   The onset of pain before his hematochezia suggests an ischemic colitis.  There is a significant drop in his HGB, but he denies any further hematochezia over the past 12 hours.  Further evaluation with a FFS will be performed emergently.  Plan: 1)  FFS now.  Clarity Ciszek D 10/18/2020, 11:19 AM

## 2020-10-18 NOTE — Assessment & Plan Note (Addendum)
-  Continue SSI and CBG monitoring -Recent A1c 5.6%

## 2020-10-19 DIAGNOSIS — Z8673 Personal history of transient ischemic attack (TIA), and cerebral infarction without residual deficits: Secondary | ICD-10-CM | POA: Diagnosis not present

## 2020-10-19 DIAGNOSIS — K922 Gastrointestinal hemorrhage, unspecified: Secondary | ICD-10-CM | POA: Diagnosis not present

## 2020-10-19 DIAGNOSIS — I480 Paroxysmal atrial fibrillation: Secondary | ICD-10-CM | POA: Diagnosis not present

## 2020-10-19 DIAGNOSIS — K921 Melena: Secondary | ICD-10-CM

## 2020-10-19 DIAGNOSIS — K5520 Angiodysplasia of colon without hemorrhage: Secondary | ICD-10-CM | POA: Diagnosis not present

## 2020-10-19 LAB — HEMOGLOBIN AND HEMATOCRIT, BLOOD
HCT: 30.7 % — ABNORMAL LOW (ref 39.0–52.0)
HCT: 32.8 % — ABNORMAL LOW (ref 39.0–52.0)
Hemoglobin: 10.1 g/dL — ABNORMAL LOW (ref 13.0–17.0)
Hemoglobin: 10.9 g/dL — ABNORMAL LOW (ref 13.0–17.0)

## 2020-10-19 LAB — BASIC METABOLIC PANEL
Anion gap: 9 (ref 5–15)
BUN: 6 mg/dL (ref 6–20)
CO2: 25 mmol/L (ref 22–32)
Calcium: 8.4 mg/dL — ABNORMAL LOW (ref 8.9–10.3)
Chloride: 104 mmol/L (ref 98–111)
Creatinine, Ser: 0.76 mg/dL (ref 0.61–1.24)
GFR, Estimated: >60 mL/min (ref >60)
Glucose, Bld: 94 mg/dL (ref 70–99)
Potassium: 3.9 mmol/L (ref 3.5–5.1)
Sodium: 138 mmol/L (ref 135–145)

## 2020-10-19 LAB — MAGNESIUM: Magnesium: 1.6 mg/dL — ABNORMAL LOW (ref 1.7–2.4)

## 2020-10-19 MED ORDER — MAGNESIUM OXIDE 400 (241.3 MG) MG PO TABS
800.0000 mg | ORAL_TABLET | Freq: Once | ORAL | Status: AC
  Administered 2020-10-19: 800 mg via ORAL
  Filled 2020-10-19: qty 2

## 2020-10-19 MED ORDER — PANTOPRAZOLE SODIUM 40 MG PO TBEC: 40.0000 mg | DELAYED_RELEASE_TABLET | Freq: Every day | ORAL | 3 refills | Status: AC | PRN

## 2020-10-19 MED ORDER — APIXABAN 5 MG PO TABS: 5.0000 mg | ORAL_TABLET | Freq: Two times a day (BID) | ORAL | Status: AC

## 2020-10-19 MED ORDER — CLOPIDOGREL BISULFATE 75 MG PO TABS: 37.5000 mg | ORAL_TABLET | Freq: Every day | ORAL | Status: AC

## 2020-10-19 NOTE — Assessment & Plan Note (Signed)
-   no longer on asa - continue Eliquis 5 mg BID

## 2020-10-19 NOTE — Progress Notes (Signed)
   GI Progress Note Covering for Drs. Mann & Hung   Subjective  No bowel movements or bleeding noted. He would like more food, current on clears. No complaints.    Objective  Vital signs in last 24 hours: Temp:  [97.9 F (36.6 C)-98.2 F (36.8 C)] 97.9 F (36.6 C) (01/07 1239) Pulse Rate:  [48-67] 61 (01/08 1115) Resp:  [15-25] 20 (01/08 1115) BP: (86-134)/(37-105) 116/50 (01/08 1115) SpO2:  [91 %-100 %] 97 % (01/08 1115)    General: Alert, well-developed, in NAD Heart:  Regular rate and rhythm; no murmurs Chest: Clear to ascultation bilaterally Abdomen:  Soft, nontender and nondistended. Normal bowel sounds, without guarding, and without rebound.   Extremities:  Without edema. Neurologic:  Alert and  oriented x4; grossly normal neurologically. Psych:  Alert and cooperative. Normal mood and affect.  Intake/Output from previous day: 01/07 0701 - 01/08 0700 In: 400 [I.V.:400] Out: 1400 [Urine:1400] Intake/Output this shift: No intake/output data recorded.  Lab Results: Recent Labs    10/17/20 2126 10/18/20 0911 10/18/20 2022 10/19/20 0000 10/19/20 0445  WBC 7.2  --   --   --   --   HGB 11.4*   < > 9.8* 10.9* 10.1*  HCT 34.6*   < > 30.2* 32.8* 30.7*  PLT 291  --   --   --   --    < > = values in this interval not displayed.   BMET Recent Labs    10/17/20 2126 10/19/20 0445  NA 138 138  K 3.8 3.9  CL 104 104  CO2 23 25  GLUCOSE 136* 94  BUN 14 6  CREATININE 0.98 0.76  CALCIUM 9.0 8.4*   LFT Recent Labs    10/17/20 2126  PROT 6.7  ALBUMIN 3.7  AST 18  ALT 15  ALKPHOS 61  BILITOT 0.4   PT/INR Recent Labs    10/18/20 0510  LABPROT 13.4  INR 1.1     Assessment & Recommendations   1. Hematochezia has resolved. Hgb is stable. 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 Advance diet and if he remains stable ok for discharge this afternoon from a GI standpoint. No ASA/NSAIDs for 2 weeks. No strenuous activity for 2 weeks. He has an appt  with Dr. Elnoria Howard for follow up next week.      LOS: 0 days   Ivan Dendinger T. Russella Dar MD 10/19/2020, 11:22 AM

## 2020-10-19 NOTE — ED Notes (Signed)
Pt reports he just went to the restroom and had a normal BM with no blood and per his MD (Dr Elnoria Howard), he can go home. Will message hospitalist about how to proceed.

## 2020-10-19 NOTE — Discharge Summary (Signed)
Physician Discharge Summary   [name] PVX:[id number] DOB: 1961-02-27 DOA: 10/18/2020  PCP: Tracey Harries, MD  Admit date: 10/18/2020 Discharge date: 10/19/2020  Admitted From: home Disposition:  home Discharging physician: Ivan Chamber, MD  Recommendations for Outpatient Follow-up:  1. Continue following with neurology and cardiology    Patient discharged to home in Discharge Condition: stable CODE STATUS: Full  Diet recommendation:  Diet Orders (From admission, onward)    Start     Ordered   10/19/20 1122  Diet Heart Room service appropriate? Yes; Fluid consistency: Thin  Diet effective now       Question Answer Comment  Room service appropriate? Yes   Fluid consistency: Thin      10/19/20 1121   10/19/20 0000  Diet - low sodium heart healthy        10/19/20 1339          Hospital Course: [name] is a 60 y.o. male with medical history significant for history of A. fib, COPD, CKD, diabetes presenting with complaint of rectal bleeding. Patient states that he had an umbilical hernia repair last month and he has had some soreness and discomfort since but seems to be healing.  Just prior to admission he states that upon using the bathroom he started passing a bowel movement that was initially bright red that then turned to dark stools as he continued to have further bowel movements. He also reported seeing some dark clots in the toilet as well. Due to these episodes, he was told to come to the ER for further follow-up and evaluation.  Hemoglobin on initial presentation was 11.4 g/dL which down trended to 48.2 g/dL. GI was consulted on admission and he was taken for colonoscopy. 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 It was treated with BICAP cauterization and 3 hemoclips. However, this was not considered the source of his bleeding on presentation. He was recommended for remaining in the hospital for further hemoglobin trending and  monitoring. Hemoglobin remained stable with further monitoring and he had no further rectal bleeding.  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 treated with BiCAP cautery and 3 hemoclips but not considered source of his bleeding -Hemoglobin stable  History of CVA (cerebrovascular accident) -Follows outpatient with neurology -On Plavix 37.5 mg daily, continued at discharge  OSA on CPAP -Continue nightly CPAP  PAF (paroxysmal atrial fibrillation) (HCC) - no longer on asa - continue Eliquis 5 mg BID  Morbid obesity (HCC) -Ongoing lifestyle modifications  Hyperlipidemia LDL goal <100 -Continue statin  DM type 2 causing vascular disease (HCC) -Continue SSI and CBG monitoring -Recent A1c 5.6%  COPD (chronic obstructive pulmonary disease) (HCC) -No signs or symptoms of exacerbation -Continue home inhalers   The patient's chronic medical conditions were treated accordingly per the patient's home medication regimen except as noted.  On day of discharge, patient was felt deemed stable for discharge. Patient/family member advised to call PCP or come back to ER if needed.   Principal Diagnosis: GI bleeding  Discharge Diagnoses: Active Hospital Problems   Diagnosis Date Noted  . GI bleeding 10/18/2020    Priority: High  . History of CVA (cerebrovascular accident) 10/19/2020    Priority: Medium  . OSA on CPAP 12/13/2017  . PAF (paroxysmal atrial fibrillation) (HCC) 12/17/2016  . 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 Hyperlipidemia LDL goal <100 07/06/2014  . Morbid obesity (  HCC) 07/06/2014  . COPD (chronic obstructive pulmonary disease) (HCC) 06/12/2012    Resolved Hospital Problems  No resolved problems to display.    Discharge Instructions    Diet - low sodium heart healthy   Complete by: As directed    Increase activity slowly   Complete by: As directed      Allergies as of 10/19/2020       Reactions   Tylox [oxycodone-acetaminophen] Hives, Other (See Comments), Rash   Sweating, shaking Reaction: Tremors and diaphoresis    Gabapentin    Gained weight and retained fluid    Adhesive [tape] Rash   States IV tape is fine      Medication List    STOP taking these medications   esomeprazole 40 MG capsule Commonly known as: NEXIUM Replaced by: pantoprazole 40 MG tablet     TAKE these medications   albuterol 108 (90 Base) MCG/ACT inhaler Commonly known as: VENTOLIN HFA Inhale 2 puffs into the lungs every 6 (six) hours as needed for wheezing or shortness of breath.   amiodarone 200 MG tablet Commonly known as: PACERONE Take 100 mg by mouth daily.   Anoro Ellipta 62.5-25 MCG/INH Aepb Generic drug: umeclidinium-vilanterol Inhale 2 puffs into the lungs daily.   apixaban 5 MG Tabs tablet Commonly known as: ELIQUIS Take 1 tablet (5 mg total) by mouth 2 (two) times daily. What changed:   how much to take  when to take this   atorvastatin 20 MG tablet Commonly known as: LIPITOR Take 20 mg by mouth at bedtime.   clopidogrel 75 MG tablet Commonly known as: PLAVIX Take 0.5 tablets (37.5 mg total) by mouth daily.   furosemide 40 MG tablet Commonly known as: LASIX Take 40 mg by mouth daily.   Klor-Con M10 10 MEQ tablet Generic drug: potassium chloride Take 20 mEq by mouth daily.   metFORMIN 500 MG tablet Commonly known as: Glucophage Take 1 tablet (500 mg total) by mouth 2 (two) times daily with a meal.   nitroGLYCERIN 0.4 MG SL tablet Commonly known as: NITROSTAT Place 1 tablet (0.4 mg total) under the tongue every 5 (five) minutes x 3 doses as needed for chest pain.   pantoprazole 40 MG tablet Commonly known as: PROTONIX Take 1 tablet (40 mg total) by mouth daily as needed (for heartburn). Replaces: esomeprazole 40 MG capsule   ramipril 5 MG capsule Commonly known as: ALTACE Take 5 mg by mouth daily.   traMADol 50 MG tablet Commonly known as:  ULTRAM Take 1-2 tablets (50-100 mg total) by mouth every 6 (six) hours as needed for moderate pain or severe pain.       Allergies  Allergen Reactions  . Tylox [Oxycodone-Acetaminophen] Hives, Other (See Comments) and Rash    Sweating, shaking Reaction: Tremors and diaphoresis   . Gabapentin     Gained weight and retained fluid   . Adhesive [Tape] Rash    States IV tape is fine    Consultations: GI  Discharge Exam: BP 134/72 (BP Location: Right Arm)   Pulse 66   Temp 98 F (36.7 C) (Oral)   Resp 19   Ht 5\' 8"  (1.727 m)   Wt 114.6 kg   SpO2 98%   BMI 38.41 kg/m  General appearance: alert, cooperative and no distress Head: Normocephalic, without obvious abnormality, atraumatic Eyes: EOMI Lungs: clear to auscultation bilaterally Heart: regular rate and rhythm and S1, S2 normal Abdomen: Obese, soft, bowel sounds present, nontender. Previous surgical incisions noted with  some superficial scar tissue appreciated notably around umbilicus Extremities: 1+ lower extremity pitting edema Skin: mobility and turgor normal Neurologic: Grossly normal  The results of significant diagnostics from this hospitalization (including imaging, microbiology, ancillary and laboratory) are listed below for reference.   Microbiology: Recent Results (from the past 240 hour(s))  Resp Panel by RT-PCR (Flu A&B, Covid) Nasopharyngeal Swab     Status: None   Collection Time: 10/18/20  4:54 AM   Specimen: Nasopharyngeal Swab; Nasopharyngeal(NP) swabs in vial transport medium  Result Value Ref Range Status   SARS Coronavirus 2 by RT PCR NEGATIVE NEGATIVE Final    Comment: (NOTE) SARS-CoV-2 target nucleic acids are NOT DETECTED.  The SARS-CoV-2 RNA is generally detectable in upper respiratory specimens during the acute phase of infection. The lowest concentration of SARS-CoV-2 viral copies this assay can detect is 138 copies/mL. A negative result does not preclude SARS-Cov-2 infection and should  not be used as the sole basis for treatment or other patient management decisions. A negative result may occur with  improper specimen collection/handling, submission of specimen other than nasopharyngeal swab, presence of viral mutation(s) within the areas targeted by this assay, and inadequate number of viral copies(<138 copies/mL). A negative result must be combined with clinical observations, patient history, and epidemiological information. The expected result is Negative.  Fact Sheet for Patients:  BloggerCourse.comhttps://www.fda.gov/media/152166/download  Fact Sheet for Healthcare Providers:  SeriousBroker.ithttps://www.fda.gov/media/152162/download  This test is no t yet approved or cleared by the [place] FDA and  has been authorized for detection and/or diagnosis of SARS-CoV-2 by FDA under an Emergency Use Authorization (EUA). This EUA will remain  in effect (meaning this test can be used) for the duration of the COVID-19 declaration under Section 564(b)(1) of the Act, 21 U.S.C.section 360bbb-3(b)(1), unless the authorization is terminated  or revoked sooner.       Influenza A by PCR NEGATIVE NEGATIVE Final   Influenza B by PCR NEGATIVE NEGATIVE Final    Comment: (NOTE) The Xpert Xpress SARS-CoV-2/FLU/RSV plus assay is intended as an aid in the diagnosis of influenza from Nasopharyngeal swab specimens and should not be used as a sole basis for treatment. Nasal washings and aspirates are unacceptable for Xpert Xpress SARS-CoV-2/FLU/RSV testing.  Fact Sheet for Patients: BloggerCourse.comhttps://www.fda.gov/media/152166/download  Fact Sheet for Healthcare Providers: SeriousBroker.ithttps://www.fda.gov/media/152162/download  This test is not yet approved or cleared by the [place] FDA and has been authorized for detection and/or diagnosis of SARS-CoV-2 by FDA under an Emergency Use Authorization (EUA). This EUA will remain in effect (meaning this test can be used) for the duration of the COVID-19 declaration under  Section 564(b)(1) of the Act, 21 U.S.C. section 360bbb-3(b)(1), unless the authorization is terminated or revoked.  Performed at [facility], [address] [place], [place] [address]      Labs: BNP (last 3 results) No results for input(s): BNP in the last 8760 hours. Basic Metabolic Panel: Recent Labs  Lab 10/17/20 2126 10/19/20 0445  NA 138 138  K 3.8 3.9  CL 104 104  CO2 23 25  GLUCOSE 136* 94  BUN 14 6  CREATININE 0.98 0.76  CALCIUM 9.0 8.4*  MG  --  1.6*   Liver Function Tests: Recent Labs  Lab 10/17/20 2126  AST 18  ALT 15  ALKPHOS 61  BILITOT 0.4  PROT 6.7  ALBUMIN 3.7   No results for input(s): LIPASE, AMYLASE in the last 168 hours. No results for input(s): AMMONIA in the last 168 hours. CBC:  Recent Labs  Lab 10/17/20 2126 10/18/20 0911 10/18/20 2022 10/19/20 0000 10/19/20 0445  WBC 7.2  --   --   --   --   NEUTROABS 4.1  --   --   --   --   HGB 11.4* 10.5* 9.8* 10.9* 10.1*  HCT 34.6* 32.3* 30.2* 32.8* 30.7*  MCV 88.7  --   --   --   --   PLT 291  --   --   --   --    Cardiac Enzymes: No results for input(s): CKTOTAL, CKMB, CKMBINDEX, TROPONINI in the last 168 hours. BNP: Invalid input(s): POCBNP CBG: No results for input(s): GLUCAP in the last 168 hours. D-Dimer No results for input(s): DDIMER in the last 72 hours. Hgb A1c No results for input(s): HGBA1C in the last 72 hours. Lipid Profile No results for input(s): CHOL, HDL, LDLCALC, TRIG, CHOLHDL, LDLDIRECT in the last 72 hours. Thyroid function studies No results for input(s): TSH, T4TOTAL, T3FREE, THYROIDAB in the last 72 hours.  Invalid input(s): FREET3 Anemia work up No results for input(s): VITAMINB12, FOLATE, FERRITIN, TIBC, IRON, RETICCTPCT in the last 72 hours. Urinalysis    Component Value Date/Time   COLORURINE AMBER (A) 07/07/2014 2134   APPEARANCEUR CLEAR 07/07/2014 2134   LABSPEC 1.031 (H) 07/07/2014 2134   PHURINE 5.5 07/07/2014 2134    GLUCOSEU NEGATIVE 07/07/2014 2134   HGBUR NEGATIVE 07/07/2014 2134   BILIRUBINUR NEGATIVE 07/07/2014 2134   KETONESUR NEGATIVE 07/07/2014 2134   PROTEINUR NEGATIVE 07/07/2014 2134   UROBILINOGEN 1.0 07/07/2014 2134   NITRITE NEGATIVE 07/07/2014 2134   LEUKOCYTESUR NEGATIVE 07/07/2014 2134   Sepsis Labs Invalid input(s): PROCALCITONIN,  WBC,  LACTICIDVEN Microbiology Recent Results (from the past 240 hour(s))  Resp Panel by RT-PCR (Flu A&B, Covid) Nasopharyngeal Swab     Status: None   Collection Time: 10/18/20  4:54 AM   Specimen: Nasopharyngeal Swab; Nasopharyngeal(NP) swabs in vial transport medium  Result Value Ref Range Status   SARS Coronavirus 2 by RT PCR NEGATIVE NEGATIVE Final    Comment: (NOTE) SARS-CoV-2 target nucleic acids are NOT DETECTED.  The SARS-CoV-2 RNA is generally detectable in upper respiratory specimens during the acute phase of infection. The lowest concentration of SARS-CoV-2 viral copies this assay can detect is 138 copies/mL. A negative result does not preclude SARS-Cov-2 infection and should not be used as the sole basis for treatment or other patient management decisions. A negative result may occur with  improper specimen collection/handling, submission of specimen other than nasopharyngeal swab, presence of viral mutation(s) within the areas targeted by this assay, and inadequate number of viral copies(<138 copies/mL). A negative result must be combined with clinical observations, patient history, and epidemiological information. The expected result is Negative.  Fact Sheet for Patients:  BloggerCourse.com  Fact Sheet for Healthcare Providers:  SeriousBroker.it  This test is no t yet approved or cleared by the [place] FDA and  has been authorized for detection and/or diagnosis of SARS-CoV-2 by FDA under an Emergency Use Authorization (EUA). This EUA will remain  in effect (meaning this  test can be used) for the duration of the COVID-19 declaration under Section 564(b)(1) of the Act, 21 U.S.C.section 360bbb-3(b)(1), unless the authorization is terminated  or revoked sooner.       Influenza A by PCR NEGATIVE NEGATIVE Final   Influenza B by PCR NEGATIVE NEGATIVE Final    Comment: (NOTE) The Xpert Xpress SARS-CoV-2/FLU/RSV plus assay is intended as an aid in the  diagnosis of influenza from Nasopharyngeal swab specimens and should not be used as a sole basis for treatment. Nasal washings and aspirates are unacceptable for Xpert Xpress SARS-CoV-2/FLU/RSV testing.  Fact Sheet for Patients: BloggerCourse.comhttps://www.fda.gov/media/152166/download  Fact Sheet for Healthcare Providers: SeriousBroker.ithttps://www.fda.gov/media/152162/download  This test is not yet approved or cleared by the [place] FDA and has been authorized for detection and/or diagnosis of SARS-CoV-2 by FDA under an Emergency Use Authorization (EUA). This EUA will remain in effect (meaning this test can be used) for the duration of the COVID-19 declaration under Section 564(b)(1) of the Act, 21 U.S.C. section 360bbb-3(b)(1), unless the authorization is terminated or revoked.  Performed at [facility], [address] [place], [place] [address]     Procedures/Studies: No results found.   Time coordinating discharge: Over 30 minutes    Ivan Chamberavid Odella Appelhans, MD  [facility] 10/19/2020, 4:11 PM

## 2020-10-19 NOTE — Assessment & Plan Note (Signed)
-  Follows outpatient with neurology -On Plavix 37.5 mg daily, continued at discharge

## 2020-10-20 ENCOUNTER — Encounter (HOSPITAL_COMMUNITY): Payer: Self-pay | Admitting: Gastroenterology

## 2020-11-07 ENCOUNTER — Other Ambulatory Visit: Payer: Self-pay | Admitting: Family Medicine

## 2020-11-08 ENCOUNTER — Other Ambulatory Visit: Payer: Self-pay | Admitting: Family Medicine

## 2020-11-08 DIAGNOSIS — Z122 Encounter for screening for malignant neoplasm of respiratory organs: Secondary | ICD-10-CM

## 2020-11-22 ENCOUNTER — Ambulatory Visit
Admission: RE | Admit: 2020-11-22 | Discharge: 2020-11-22 | Disposition: A | Discharge status: Home / Self Care | Payer: Medicare HMO | Source: Ambulatory Visit | Attending: Family Medicine | Admitting: Family Medicine

## 2020-11-22 ENCOUNTER — Other Ambulatory Visit: Payer: Self-pay

## 2020-11-22 DIAGNOSIS — Z122 Encounter for screening for malignant neoplasm of respiratory organs: Secondary | ICD-10-CM

## 2021-01-28 DIAGNOSIS — K5521 Angiodysplasia of colon with hemorrhage: Secondary | ICD-10-CM | POA: Insufficient documentation

## 2021-02-10 ENCOUNTER — Other Ambulatory Visit (HOSPITAL_COMMUNITY): Payer: Self-pay | Admitting: Interventional Radiology

## 2021-02-10 DIAGNOSIS — I671 Cerebral aneurysm, nonruptured: Secondary | ICD-10-CM

## 2021-02-19 ENCOUNTER — Ambulatory Visit (HOSPITAL_COMMUNITY)
Admission: RE | Admit: 2021-02-19 | Discharge: 2021-02-19 | Disposition: A | Discharge status: Home / Self Care | Payer: Medicare HMO | Source: Ambulatory Visit | Attending: Interventional Radiology | Admitting: Interventional Radiology

## 2021-02-19 ENCOUNTER — Other Ambulatory Visit: Payer: Self-pay

## 2021-02-19 DIAGNOSIS — I671 Cerebral aneurysm, nonruptured: Secondary | ICD-10-CM | POA: Insufficient documentation

## 2021-02-19 LAB — POCT I-STAT CREATININE: Creatinine, Ser: 0.8 mg/dL (ref 0.61–1.24)

## 2021-02-19 MED ORDER — SODIUM CHLORIDE (PF) 0.9 % IJ SOLN
INTRAMUSCULAR | Status: AC
  Filled 2021-02-19: qty 50

## 2021-02-19 MED ORDER — IOHEXOL 350 MG/ML SOLN
100.0000 mL | Freq: Once | INTRAVENOUS | Status: AC | PRN
  Administered 2021-02-19: 100 mL via INTRAVENOUS

## 2021-02-24 ENCOUNTER — Telehealth (HOSPITAL_COMMUNITY): Payer: Self-pay

## 2021-02-24 NOTE — Telephone Encounter (Signed)
Pt agreed to f/u in 1 year with cta head/neck. AW  

## 2021-06-19 ENCOUNTER — Other Ambulatory Visit: Payer: Self-pay

## 2021-06-19 ENCOUNTER — Ambulatory Visit (INDEPENDENT_AMBULATORY_CARE_PROVIDER_SITE_OTHER): Payer: Medicare HMO

## 2021-06-19 ENCOUNTER — Ambulatory Visit: Payer: Medicare HMO | Admitting: Sports Medicine

## 2021-06-19 ENCOUNTER — Encounter: Payer: Self-pay | Admitting: Sports Medicine

## 2021-06-19 DIAGNOSIS — M7751 Other enthesopathy of right foot: Secondary | ICD-10-CM

## 2021-06-19 DIAGNOSIS — M79671 Pain in right foot: Secondary | ICD-10-CM

## 2021-06-19 DIAGNOSIS — M722 Plantar fascial fibromatosis: Secondary | ICD-10-CM | POA: Diagnosis not present

## 2021-06-19 DIAGNOSIS — E119 Type 2 diabetes mellitus without complications: Secondary | ICD-10-CM

## 2021-06-19 DIAGNOSIS — M778 Other enthesopathies, not elsewhere classified: Secondary | ICD-10-CM

## 2021-06-19 MED ORDER — TRIAMCINOLONE ACETONIDE 10 MG/ML IJ SUSP
10.0000 mg | Freq: Once | INTRAMUSCULAR | Status: AC
  Administered 2021-06-19: 10 mg

## 2021-06-19 NOTE — Progress Notes (Signed)
Subjective: [name] is a 60 y.o. male patient presents to office with complaint of moderate heel pain on right and also pain into the ankle for the last several months.  Patient reports that is painful to put pressure on the foot and when he is sitting for a while and goes to stand the pain is unbearable states that it does ease up a little bit when he does some walking however he does a lot of walking and standing for work and the more that he is on his foot the more it hurts also reports that when he flexes his foot pain increases.  Patient denies any known history of trauma or injury.  Patient admits to a history of diabetes with last blood sugar not recorded no longer checks but was states that his last A1c was around 5-1/2. Last visit to PCP 2 months ago.  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 (HCC) 12/15/2018   OSA and COPD overlap syndrome (HCC) 12/13/2017   OSA on CPAP 12/13/2017   COPD (chronic obstructive pulmonary disease) with chronic bronchitis (HCC) 06/22/2017   Class 2 obesity with alveolar hypoventilation, serious comorbidity, and body mass index (BMI) of 38.0 to 38.9 in adult (HCC) 06/22/2017   Cerebral embolism with transient ischemic attack (TIA) 06/22/2017   Postoperative hypoxemia 06/22/2017   Nocturia more than twice per night 06/22/2017   Aneurysm (HCC) 05/11/2017   PAF (paroxysmal atrial fibrillation) (HCC) 12/17/2016   Carotid stenosis, symptomatic w/o infarct 09/25/2015   Paresthesia and pain of right extremity 12/04/2014   Carotid stenosis 12/04/2014   Stroke, hemorrhagic (HCC) 09/01/2014   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) 08/31/2014   TIA (transient ischemic attack) 08/14/2014   Right arm weakness    Cerebral hemorrhage (HCC) 08/11/2014    ICH (intracerebral hemorrhage) (HCC) 08/11/2014   Brain aneurysm 08/08/2014   Chest pain at rest 07/08/2014   right ICAO (internal carotid artery occlusion) 07/06/2014   DM type 2 causing vascular disease (HCC) 07/06/2014   Hyperlipidemia LDL goal <100 07/06/2014   Morbid obesity (HCC) 07/06/2014   Acute CVA (cerebrovascular accident) (HCC) 07/03/2014   GERD (gastroesophageal reflux disease) 10/19/2012   COPD (chronic obstructive pulmonary disease) (HCC) 06/12/2012    Current Outpatient Medications on File Prior to Visit  Medication Sig Dispense Refill   albuterol (PROVENTIL HFA;VENTOLIN HFA) 108 (90 BASE) MCG/ACT inhaler Inhale 2 puffs into the lungs every 6 (six) hours as needed for wheezing or shortness of breath.      amiodarone (PACERONE) 200 MG tablet Take 100 mg by mouth daily.     apixaban (ELIQUIS) 5 MG TABS tablet Take 1 tablet (5 mg total) by mouth 2 (two) times daily. 60 tablet    atorvastatin (LIPITOR) 20 MG tablet Take 20 mg by mouth at bedtime.      clopidogrel (PLAVIX) 75 MG tablet Take 0.5 tablets (37.5 mg total) by mouth daily.     furosemide (LASIX) 40 MG tablet Take 40 mg by mouth daily.     KLOR-CON M10 10 MEQ tablet Take 20 mEq by mouth daily.     metFORMIN (GLUCOPHAGE) 500 MG tablet Take 1 tablet (500 mg total) by mouth 2 (two) times daily with a meal. 60 tablet 0   nitroGLYCERIN (NITROSTAT) 0.4 MG SL tablet Place 1  tablet (0.4 mg total) under the tongue every 5 (five) minutes x 3 doses as needed for chest pain. 25 tablet 1   pantoprazole (PROTONIX) 40 MG tablet Take 1 tablet (40 mg total) by mouth daily as needed (for heartburn). 30 tablet 3   ramipril (ALTACE) 5 MG capsule Take 5 mg by mouth daily.     traMADol (ULTRAM) 50 MG tablet Take 1-2 tablets (50-100 mg total) by mouth every 6 (six) hours as needed for moderate pain or severe pain. 30 tablet 0   umeclidinium-vilanterol (ANORO ELLIPTA) 62.5-25 MCG/INH AEPB Inhale 2 puffs into the lungs daily.     No  current facility-administered medications on file prior to visit.    Allergies  Allergen Reactions   Tylox [Oxycodone-Acetaminophen] Hives, Other (See Comments) and Rash    Sweating, shaking Reaction: Tremors and diaphoresis    Gabapentin     Gained weight and retained fluid    Adhesive [Tape] Rash    States IV tape is fine    Objective: Physical Exam General: The patient is alert and oriented x3 in no acute distress.  Dermatology: Skin is warm, dry and supple bilateral lower extremities. Nails 1-10 are normal. There is no erythema, edema, no eccymosis, no open lesions present. Integument is otherwise unremarkable.  Vascular: Dorsalis Pedis pulse 2/4 and Posterior Tibial pulse are 1/4 bilateral.  Varicosities bilateral medial ankles.  Capillary fill time is immediate to all digits.  Neurological: Grossly intact to light touch bilateral.  Musculoskeletal: Tenderness to palpation at the medial calcaneal tubercale and through the insertion of the plantar fascia on the right foot.  There is also pain along the posterior tibial tendon and peroneal tendon course with worse pain noted at the peroneal tendon likely from compensation there is also pain over the dorsal lateral foot over the extensor digitorum brevis and sinus tarsi area as well on the right.  No pain with compression of calcaneus bilateral. No pain with calf compression bilateral. There is decreased Ankle joint range of motion bilateral. All other joints range of motion within normal limits bilateral. Strength 5/5 in all groups bilateral.   Gait: Unassisted, Antalgic avoid weight on right heel  Xray, Right foot: Normal osseous mineralization. Joint spaces preserved. No fracture/dislocation/boney destruction.  Minimal calcaneal spur present with mild thickening of plantar fascia. No other soft tissue abnormalities or radiopaque foreign bodies.   Assessment and Plan: Problem List Items Addressed This Visit   None Visit  Diagnoses     Tendinitis of right foot    -  Primary   Relevant Orders   DG Foot Complete Right   Plantar fasciitis of right foot       Right foot pain       Diabetes mellitus without complication (HCC)           -Complete examination performed.  -Xrays reviewed -Discussed with patient in detail the condition of plantar fasciitis and compensation tendinitis, how this occurs and general treatment options. Explained both conservative and surgical treatments.  -After oral consent and aseptic prep, injected a mixture containing 1 ml of 2% plain lidocaine, 1 ml 0.5% plain marcaine, 0.5 ml of kenalog 10 and 0.5 ml of dexamethasone phosphate into right heel. Post-injection care discussed with patient.  -Patient currently on Mobic given by PCP however advised patient of risk of excessive bleeding because he is on Eliquis and Plavix -Recommended good supportive shoes and advised use of Tri-Lock brace as dispensed this visit for the right -Advised patient  to avoid stretching exercises at this time to prevent further stress or injury to the tendons until the inflammation has resolved -Recommend patient to ice affected area 1-2x daily. -Patient to return to office in 3-4 weeks for follow up or sooner if problems or questions arise.  If pain continues may warrant further immobilization in a boot versus MRI.  Asencion Islam, DPM

## 2021-07-01 ENCOUNTER — Other Ambulatory Visit: Payer: Self-pay | Admitting: Sports Medicine

## 2021-07-01 DIAGNOSIS — M722 Plantar fascial fibromatosis: Secondary | ICD-10-CM

## 2021-07-24 ENCOUNTER — Ambulatory Visit: Payer: Medicare HMO | Admitting: Sports Medicine

## 2021-10-19 ENCOUNTER — Encounter (HOSPITAL_COMMUNITY): Payer: Self-pay | Admitting: Emergency Medicine

## 2021-10-19 ENCOUNTER — Ambulatory Visit (HOSPITAL_COMMUNITY)
Admission: EM | Admit: 2021-10-19 | Discharge: 2021-10-19 | Disposition: A | Discharge status: Home / Self Care | Payer: Medicare HMO | Attending: Physician Assistant | Admitting: Physician Assistant

## 2021-10-19 ENCOUNTER — Ambulatory Visit (INDEPENDENT_AMBULATORY_CARE_PROVIDER_SITE_OTHER): Payer: Medicare HMO

## 2021-10-19 DIAGNOSIS — M25531 Pain in right wrist: Secondary | ICD-10-CM | POA: Diagnosis not present

## 2021-10-19 DIAGNOSIS — S63501A Unspecified sprain of right wrist, initial encounter: Secondary | ICD-10-CM

## 2021-10-19 NOTE — ED Provider Notes (Signed)
[place]    CSN: [id number] Arrival date & time: 10/19/21  1245      History   Chief Complaint Chief Complaint  Patient presents with   Wrist Pain    HPI [name] is a 61 y.o. male.   Pt complains of right wrist pain.  Reports it started after bowling a few weeks ago.  His work requires lifting heavy boxes which he has had difficulty with due to pain.  Reports supination and extension makes the pain worse.  He has tried nothing for the pain, no similar pain in the past.    Past Medical History:  Diagnosis Date   Aneurysm ([place])    brain   Arthritis    Atrial fibrillation ([place])    Bipolar disorder ([place])    was on meds but was taken off 2 yrs ago and none since   Burning pain    in both legs-seeing Dr.Sethi for this   CAD (coronary artery disease)    Cataracts, bilateral    Childhood asthma    Last asthma attack at age 12; History of trach at 16 months (12/17/2016)   Chronic kidney disease 2009   Chronic lower back pain    COPD (chronic obstructive pulmonary disease) ([place]) 06/2012   uses Spiriva and Albuterol daily as needed   Depression    Family history of adverse reaction to anesthesia    "Mom and sister have PONV"   GERD (gastroesophageal reflux disease)    H/O hiatal hernia    Headache(784.0)    "q 2-3 weeks; daily last 2 wks" (12/17/2016)   Hyperlipidemia    takes Ramipril daily   Hypertension    takes Ramipril daily   Joint pain    Joint swelling    Middle cerebral artery aneurysm    right   Nocturia    Numbness    both arms    Pneumonia    hx of-2014   PONV (postoperative nausea and vomiting)    Pt reports nausea only.   Sleep apnea    dx just this past week   08/21/2017   Stroke [facility]) 2015   "drag left foot more since; have to wear glasses now" (12/17/2016)   Type II diabetes mellitus ([place])    takes Metformin daily   dx [id number]   Umbilical hernia    Urinary frequency    Wears dentures    Wears glasses    and contact lenses     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 ([place]) 12/15/2018   OSA and COPD overlap syndrome ([place]) 12/13/2017   OSA on CPAP 12/13/2017   COPD (chronic obstructive pulmonary disease) with chronic bronchitis ([place]) 06/22/2017   Class 2 obesity with alveolar hypoventilation, serious comorbidity, and body mass index (BMI) of 38.0 to 38.9 in adult ([place]) 06/22/2017   Cerebral embolism with transient ischemic attack (TIA) 06/22/2017   Postoperative hypoxemia 06/22/2017   Nocturia more than twice per night 06/22/2017   Aneurysm ([place]) 05/11/2017   PAF (paroxysmal atrial fibrillation) ([place]) 12/17/2016   Carotid stenosis, symptomatic w/o infarct 09/25/2015   Paresthesia and pain of right extremity 12/04/2014   Carotid stenosis 12/04/2014   Stroke, hemorrhagic ([place]) 09/01/2014   Cerebral infarction due to embolism of right carotid artery ([place]) 08/31/2014   TIA (transient ischemic attack)  08/14/2014   Right arm weakness    Cerebral hemorrhage (HCC) 08/11/2014   ICH (intracerebral hemorrhage) (HCC) 08/11/2014   Brain aneurysm 08/08/2014   Chest pain at rest 07/08/2014   right ICAO (internal carotid artery occlusion) 07/06/2014   DM type 2 causing vascular disease (HCC) 07/06/2014   Hyperlipidemia LDL goal <100 07/06/2014   Morbid obesity (HCC) 07/06/2014   Acute CVA (cerebrovascular accident) (HCC) 07/03/2014   GERD (gastroesophageal reflux disease) 10/19/2012   COPD (chronic obstructive pulmonary disease) (HCC) 06/12/2012    Past Surgical History:  Procedure Laterality Date   ANEURYSM COILING  2015   CARDIOVERSION N/A 12/18/2016   Procedure: CARDIOVERSION;  Surgeon: Orpah Cobb, MD;  Location: [facility];  Service: Cardiovascular;  Laterality: N/A;   COLONOSCOPY     COLONOSCOPY N/A 04/08/2018    Procedure: COLONOSCOPY;  Surgeon: Jeani Hawking, MD;  Location: [facility];  Service: Endoscopy;  Laterality: N/A;   COLONOSCOPY N/A 10/18/2020   Procedure: COLONOSCOPY;  Surgeon: Jeani Hawking, MD;  Location: [facility];  Service: Endoscopy;  Laterality: N/A;   ESOPHAGOGASTRODUODENOSCOPY N/A 04/08/2018   Procedure: ESOPHAGOGASTRODUODENOSCOPY (EGD);  Surgeon: Jeani Hawking, MD;  Location: [facility];  Service: Endoscopy;  Laterality: N/A;   EXCISIONAL HEMORRHOIDECTOMY  1980s   "soon after rectal OR"   GANGLION CYST EXCISION Right    HEMOSTASIS CLIP PLACEMENT  10/18/2020   Procedure: HEMOSTASIS CLIP PLACEMENT;  Surgeon: Jeani Hawking, MD;  Location: [facility];  Service: Endoscopy;;   HOT HEMOSTASIS N/A 04/08/2018   Procedure: HOT HEMOSTASIS (ARGON PLASMA COAGULATION/BICAP);  Surgeon: Jeani Hawking, MD;  Location: [facility];  Service: Endoscopy;  Laterality: N/A;   HOT HEMOSTASIS N/A 10/18/2020   Procedure: HOT HEMOSTASIS (ARGON PLASMA COAGULATION/BICAP);  Surgeon: Jeani Hawking, MD;  Location: [facility];  Service: Endoscopy;  Laterality: N/A;   IR 3D INDEPENDENT WKST  05/10/2017   IR 3D INDEPENDENT WKST  06/28/2017   IR ANGIO INTRA EXTRACRAN SEL COM CAROTID INNOMINATE BILAT MOD SED  04/22/2017   IR ANGIO INTRA EXTRACRAN SEL COM CAROTID INNOMINATE BILAT MOD SED  11/22/2017   IR ANGIO INTRA EXTRACRAN SEL COM CAROTID INNOMINATE BILAT MOD SED  05/13/2018   IR ANGIO INTRA EXTRACRAN SEL INTERNAL CAROTID UNI L MOD SED  12/27/2017   IR ANGIO INTRA EXTRACRAN SEL INTERNAL CAROTID UNI R MOD SED  05/10/2017   IR ANGIO INTRA EXTRACRAN SEL INTERNAL CAROTID UNI R MOD SED  06/28/2017   IR ANGIO VERTEBRAL SEL SUBCLAVIAN INNOMINATE UNI L MOD SED  04/22/2017   IR ANGIO VERTEBRAL SEL SUBCLAVIAN INNOMINATE UNI R MOD SED  06/28/2017   IR ANGIO VERTEBRAL SEL SUBCLAVIAN INNOMINATE UNI R MOD SED  11/22/2017   IR ANGIO VERTEBRAL SEL SUBCLAVIAN INNOMINATE UNI R MOD SED  05/13/2018   IR ANGIO VERTEBRAL SEL VERTEBRAL UNI R MOD  SED  04/22/2017   IR ANGIOGRAM FOLLOW UP STUDY  05/10/2017   IR ANGIOGRAM FOLLOW UP STUDY  06/28/2017   IR ANGIOGRAM FOLLOW UP STUDY  12/27/2017   IR NEURO EACH ADD'L AFTER BASIC UNI LEFT (MS)  12/27/2017   IR NEURO EACH ADD'L AFTER BASIC UNI RIGHT (MS)  05/10/2017   IR NEURO EACH ADD'L AFTER BASIC UNI RIGHT (MS)  06/28/2017   IR RADIOLOGIST EVAL & MGMT  04/30/2017   IR RADIOLOGIST EVAL & MGMT  06/15/2017   IR RADIOLOGIST EVAL & MGMT  07/13/2017   IR RADIOLOGIST EVAL & MGMT  01/11/2018   IR TRANSCATH/EMBOLIZ  05/10/2017   IR TRANSCATH/EMBOLIZ  06/28/2017  IR TRANSCATH/EMBOLIZ  12/27/2017   MULTIPLE TOOTH EXTRACTIONS     POLYPECTOMY  04/08/2018   Procedure: POLYPECTOMY;  Surgeon: Carol Ada, MD;  [facility];  Service: Endoscopy;;   RADIOLOGY WITH ANESTHESIA N/A 08/08/2014   Procedure: RADIOLOGY WITH ANESTHESIA EMBOLIZATION;  Surgeon: Medication Radiologist, MD;  Location: [facility];  Service: Radiology;  Laterality: N/A;   RADIOLOGY WITH ANESTHESIA N/A 11/21/2014   Procedure: RADIOLOGY WITH ANESTHESIA;  Surgeon: Rob Hickman, MD;  Location: [facility];  Service: Radiology;  Laterality: N/A;   RADIOLOGY WITH ANESTHESIA N/A 02/27/2015   Procedure: RADIOLOGY WITH ANESTHESIA;  Surgeon: Luanne Bras, MD;  Location: [facility];  Service: Radiology;  Laterality: N/A;   RADIOLOGY WITH ANESTHESIA N/A 09/25/2015   Procedure: RADIOLOGY WITH ANESTHESIA;  Surgeon: Luanne Bras, MD;  Location: [facility];  Service: Radiology;  Laterality: N/A;   RADIOLOGY WITH ANESTHESIA N/A 05/10/2017   Procedure: EMBOLIZATION;  Surgeon: Luanne Bras, MD;  Location: [facility];  Service: Radiology;  Laterality: N/A;   RADIOLOGY WITH ANESTHESIA N/A 06/28/2017   Procedure: RADIOLOGY WITH ANESTHESIA-EMBOLIZATION;  Surgeon: Luanne Bras, MD;  Location: [facility];  Service: Radiology;  Laterality: N/A;   RADIOLOGY WITH ANESTHESIA N/A 08/26/2017   Procedure: RADIOLOGY WITH ANESTHESIA EMBOLIZATION;  Surgeon: Luanne Bras,  MD;  Location: [facility];  Service: Radiology;  Laterality: N/A;   RADIOLOGY WITH ANESTHESIA N/A 11/22/2017   Procedure: EMBOLIZATION;  Surgeon: Luanne Bras, MD;  Location: [facility];  Service: Radiology;  Laterality: N/A;   RADIOLOGY WITH ANESTHESIA N/A 12/27/2017   Procedure: EMBOLIZATION;  Surgeon: Luanne Bras, MD;  Location: [facility];  Service: Radiology;  Laterality: N/A;   RECTAL SURGERY  1980s   "tore it lifting heavy furniture; sewed it back together"   SHOULDER ARTHROSCOPY WITH OPEN ROTATOR CUFF REPAIR Right 2016   SHOULDER ARTHROSCOPY WITH ROTATOR CUFF REPAIR Left 1996   SHOULDER OPEN ROTATOR CUFF REPAIR Left 1997   "took out 8inches of my collarbone"   TEE WITHOUT CARDIOVERSION N/A 12/18/2016   Procedure: TRANSESOPHAGEAL ECHOCARDIOGRAM (TEE);  Surgeon: Dixie Dials, MD;  Location: [facility] ENDOSCOPY;  Service: Cardiovascular;  Laterality: N/A;   [place]   at age 63 months old; for asthma   TRACHEOSTOMY CLOSURE     VENTRAL HERNIA REPAIR N/A 09/24/2020   Procedure: LAPAROSCOPIC VENTRAL WALL HERNIA REPAIR WITH MESH;  Surgeon: Michael Boston, MD;  Location: [facility];  Service: General;  Laterality: N/A;       Home Medications    Prior to Admission medications   Medication Sig Start Date End Date Taking? Authorizing Provider  albuterol (PROVENTIL HFA;VENTOLIN HFA) 108 (90 BASE) MCG/ACT inhaler Inhale 2 puffs into the lungs every 6 (six) hours as needed for wheezing or shortness of breath.     [provider]  amiodarone (PACERONE) 200 MG tablet Take 100 mg by mouth daily.    [provider]  apixaban (ELIQUIS) 5 MG TABS tablet Take 1 tablet (5 mg total) by mouth 2 (two) times daily. 10/19/20   Dwyane Dee, MD  atorvastatin (LIPITOR) 20 MG tablet Take 20 mg by mouth at bedtime.     [provider]  clopidogrel (PLAVIX) 75 MG tablet Take 0.5 tablets (37.5 mg total) by mouth daily. 10/19/20   Dwyane Dee, MD  furosemide (LASIX) 40 MG tablet Take 40 mg  by mouth daily.    [provider]  KLOR-CON M10 10 MEQ tablet Take 20 mEq by mouth daily. 08/22/20   [provider]  metFORMIN (GLUCOPHAGE) 500 MG tablet Take 1 tablet (500 mg total) by mouth 2 (two) times daily with a meal. 07/06/14   Debbe Odea, MD  nitroGLYCERIN (NITROSTAT) 0.4 MG SL tablet Place 1 tablet (0.4 mg total) under the tongue every 5 (five) minutes x 3 doses as needed for chest pain. 07/08/14   Dixie Dials, MD  pantoprazole (PROTONIX) 40 MG tablet Take 1 tablet (40 mg total) by mouth daily as needed (for heartburn). 10/19/20   Dwyane Dee, MD  ramipril (ALTACE) 5 MG capsule Take 5 mg by mouth daily. 08/22/20   [provider]  traMADol (ULTRAM) 50 MG tablet Take 1-2 tablets (50-100 mg total) by mouth every 6 (six) hours as needed for moderate pain or severe pain. 09/24/20   Michael Boston, MD  umeclidinium-vilanterol (ANORO ELLIPTA) 62.5-25 MCG/INH AEPB Inhale 2 puffs into the lungs daily.    [provider]    Family History Family History  Problem Relation Age of Onset   Heart disease Mother        s/p 3V CABG   Cancer Father 49       lung cancer; +tobacco   Stroke Father    Cancer Maternal Grandmother    Heart disease Maternal Grandmother    Cancer Paternal Grandmother    Lupus Sister    Multiple sclerosis Sister    Anemia Daughter     Social History Social History   Tobacco Use   Smoking status: Former    Packs/day: 1.50    Years: 25.00    Pack years: 37.50    Types: Cigarettes    Start date: 01/15/1975    Quit date: 06/02/2014    Years since quitting: 7.3   Smokeless tobacco: Never  Vaping Use   Vaping Use: Former  Substance Use Topics   Alcohol use: Yes    Alcohol/week: 3.0 standard drinks    Types: 3 Cans of beer per week    Comment: drinks 3 - 12oz bottles 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  HENT:  Negative for ear pain and sore throat.   Eyes:  Negative for pain and visual disturbance.  Respiratory:  Negative for cough and shortness of breath.   Cardiovascular:  Negative for chest pain and palpitations.  Gastrointestinal:  Negative for abdominal pain and vomiting.  Genitourinary:  Negative for dysuria and hematuria.  Musculoskeletal:  Positive for arthralgias (right wrist pain). Negative for back pain.  Skin:  Negative for color change and rash.  Neurological:  Negative for seizures and syncope.  All other systems reviewed and are negative.   Physical Exam Triage Vital Signs ED Triage Vitals  Enc Vitals Group     BP 10/19/21 1410 139/85     Pulse Rate 10/19/21 1410 63     Resp 10/19/21 1410 19     Temp 10/19/21 1410 97.8 F (36.6 C)     Temp Source 10/19/21 1410 Oral     SpO2 10/19/21 1410 96 %     Weight --      Height --      Head Circumference --      Peak Flow --      Pain Score 10/19/21 1409 4     Pain Loc --      Pain Edu? --  Excl. in GC? --    No data found.  Updated Vital Signs BP 139/85 (BP Location: Left Arm)    Pulse 63    Temp 97.8 F (36.6 C) (Oral)    Resp 19    SpO2 96%   Visual Acuity Right Eye Distance:   Left Eye Distance:   Bilateral Distance:    Right Eye Near:   Left Eye Near:    Bilateral Near:     Physical Exam Vitals and nursing note reviewed.  Constitutional:      General: He is not in acute distress.    Appearance: He is well-developed.  HENT:     Head: Normocephalic and atraumatic.  Eyes:     Conjunctiva/sclera: Conjunctivae normal.  Cardiovascular:     Rate and Rhythm: Normal rate and regular rhythm.     Heart sounds: No murmur heard. Pulmonary:     Effort: Pulmonary effort is normal. No respiratory distress.     Breath sounds: Normal breath sounds.  Abdominal:     Palpations: Abdomen is soft.     Tenderness: There is no  abdominal tenderness.  Musculoskeletal:        General: No swelling.     Right wrist: Bony tenderness present. No swelling or deformity. Decreased range of motion (decreased ROM with extension due to pain).     Cervical back: Neck supple.  Skin:    General: Skin is warm and dry.     Capillary Refill: Capillary refill takes less than 2 seconds.  Neurological:     Mental Status: He is alert.  Psychiatric:        Mood and Affect: Mood normal.     UC Treatments / Results  Labs (all labs ordered are listed, but only abnormal results are displayed) Labs Reviewed - No data to display  EKG   Radiology DG Wrist Complete Right  Result Date: 10/19/2021 CLINICAL DATA:  Two week history of wrist pain EXAM: RIGHT WRIST - COMPLETE 3+ VIEW COMPARISON:  None. FINDINGS: There is no evidence of fracture or dislocation. There is no evidence of arthropathy or other focal bone abnormality. Soft tissues are unremarkable. IMPRESSION: Negative. Electronically Signed   By: Kerby Moors M.D.   On: 10/19/2021 14:43    Procedures Procedures (including critical care time)  Medications Ordered in UC Medications - No data to display  Initial Impression / Assessment and Plan / UC Course  I have reviewed the triage vital signs and the nursing notes.  Pertinent labs & imaging results that were available during my care of the patient were reviewed by me and considered in my medical decision making (see chart for details).     Right wrist pain, xray negative.  Advise immobilization, wrist brace given.  NSAIDS as needed.  Follow up with ortho if no improvement.  Final Clinical Impressions(s) / UC Diagnoses   Final diagnoses:  Sprain of right wrist, initial encounter     Discharge Instructions      Wear brace  Recommend ibuprofen as needed for pain and inflammation  Follow up with orthopedics if no improvement      ED Prescriptions   None    PDMP not reviewed this encounter.   Ward,  Lenise Arena, PA-C 10/19/21 1452

## 2021-10-19 NOTE — ED Triage Notes (Signed)
Pt had pain in right wrist for almost 2 weeks after bowling. Reports still been working and pain hasnt gotten any better.

## 2021-10-19 NOTE — Discharge Instructions (Signed)
Wear brace  Recommend ibuprofen as needed for pain and inflammation  Follow up with orthopedics if no improvement

## 2021-12-22 ENCOUNTER — Telehealth: Payer: Self-pay

## 2021-12-22 NOTE — Telephone Encounter (Signed)
Pt's mother called with c/o leg feeling "icy and numb" for the last several weeks. She was not with pt and is unsure if he is having any color change or swelling. Pt has not been seen here since 2019 and has been advised to contact his PCP immediately and to be seen by them and have referral sent over. Pt's mother verbalized understanding.  ?

## 2021-12-28 ENCOUNTER — Emergency Department (HOSPITAL_COMMUNITY)
Admission: EM | Admit: 2021-12-28 | Discharge: 2021-12-28 | Disposition: A | Discharge status: Home / Self Care | Payer: Medicare HMO | Attending: Student | Admitting: Student

## 2021-12-28 ENCOUNTER — Emergency Department (HOSPITAL_COMMUNITY): Payer: Medicare HMO

## 2021-12-28 ENCOUNTER — Other Ambulatory Visit: Payer: Self-pay

## 2021-12-28 ENCOUNTER — Encounter (HOSPITAL_COMMUNITY): Payer: Self-pay | Admitting: Emergency Medicine

## 2021-12-28 DIAGNOSIS — J45909 Unspecified asthma, uncomplicated: Secondary | ICD-10-CM | POA: Insufficient documentation

## 2021-12-28 DIAGNOSIS — E1122 Type 2 diabetes mellitus with diabetic chronic kidney disease: Secondary | ICD-10-CM | POA: Diagnosis not present

## 2021-12-28 DIAGNOSIS — Z87891 Personal history of nicotine dependence: Secondary | ICD-10-CM | POA: Insufficient documentation

## 2021-12-28 DIAGNOSIS — Z7902 Long term (current) use of antithrombotics/antiplatelets: Secondary | ICD-10-CM | POA: Diagnosis not present

## 2021-12-28 DIAGNOSIS — I2510 Atherosclerotic heart disease of native coronary artery without angina pectoris: Secondary | ICD-10-CM | POA: Diagnosis not present

## 2021-12-28 DIAGNOSIS — N189 Chronic kidney disease, unspecified: Secondary | ICD-10-CM | POA: Insufficient documentation

## 2021-12-28 DIAGNOSIS — I480 Paroxysmal atrial fibrillation: Secondary | ICD-10-CM | POA: Insufficient documentation

## 2021-12-28 DIAGNOSIS — Z7951 Long term (current) use of inhaled steroids: Secondary | ICD-10-CM | POA: Diagnosis not present

## 2021-12-28 DIAGNOSIS — Z79899 Other long term (current) drug therapy: Secondary | ICD-10-CM | POA: Diagnosis not present

## 2021-12-28 DIAGNOSIS — M48061 Spinal stenosis, lumbar region without neurogenic claudication: Secondary | ICD-10-CM | POA: Insufficient documentation

## 2021-12-28 DIAGNOSIS — J449 Chronic obstructive pulmonary disease, unspecified: Secondary | ICD-10-CM | POA: Diagnosis not present

## 2021-12-28 DIAGNOSIS — Z7984 Long term (current) use of oral hypoglycemic drugs: Secondary | ICD-10-CM | POA: Insufficient documentation

## 2021-12-28 DIAGNOSIS — I129 Hypertensive chronic kidney disease with stage 1 through stage 4 chronic kidney disease, or unspecified chronic kidney disease: Secondary | ICD-10-CM | POA: Insufficient documentation

## 2021-12-28 DIAGNOSIS — M545 Low back pain, unspecified: Secondary | ICD-10-CM | POA: Diagnosis present

## 2021-12-28 DIAGNOSIS — Z7901 Long term (current) use of anticoagulants: Secondary | ICD-10-CM | POA: Diagnosis not present

## 2021-12-28 DIAGNOSIS — M5441 Lumbago with sciatica, right side: Secondary | ICD-10-CM

## 2021-12-28 LAB — COMPREHENSIVE METABOLIC PANEL
ALT: 22 U/L (ref 0–44)
AST: 21 U/L (ref 15–41)
Albumin: 3.8 g/dL (ref 3.5–5.0)
Alkaline Phosphatase: 57 U/L (ref 38–126)
Anion gap: 6 (ref 5–15)
BUN: 8 mg/dL (ref 6–20)
CO2: 28 mmol/L (ref 22–32)
Calcium: 8.6 mg/dL — ABNORMAL LOW (ref 8.9–10.3)
Chloride: 102 mmol/L (ref 98–111)
Creatinine, Ser: 0.95 mg/dL (ref 0.61–1.24)
GFR, Estimated: >60 mL/min (ref >60)
Glucose, Bld: 98 mg/dL (ref 70–99)
Potassium: 3.8 mmol/L (ref 3.5–5.1)
Sodium: 136 mmol/L (ref 135–145)
Total Bilirubin: 0.6 mg/dL (ref 0.3–1.2)
Total Protein: 6.5 g/dL (ref 6.5–8.1)

## 2021-12-28 LAB — CBC WITH DIFFERENTIAL/PLATELET
Abs Immature Granulocytes: 0.01 10*3/uL (ref 0.00–0.07)
Basophils Absolute: 0 10*3/uL (ref 0.0–0.1)
Basophils Relative: 1 %
Eosinophils Absolute: 0.2 10*3/uL (ref 0.0–0.5)
Eosinophils Relative: 3 %
HCT: 38.8 % — ABNORMAL LOW (ref 39.0–52.0)
Hemoglobin: 12.9 g/dL — ABNORMAL LOW (ref 13.0–17.0)
Immature Granulocytes: 0 %
Lymphocytes Relative: 35 %
Lymphs Abs: 2.2 10*3/uL (ref 0.7–4.0)
MCH: 29.9 pg (ref 26.0–34.0)
MCHC: 33.2 g/dL (ref 30.0–36.0)
MCV: 89.8 fL (ref 80.0–100.0)
Monocytes Absolute: 0.6 10*3/uL (ref 0.1–1.0)
Monocytes Relative: 10 %
Neutro Abs: 3.2 10*3/uL (ref 1.7–7.7)
Neutrophils Relative %: 51 %
Platelets: 247 10*3/uL (ref 150–400)
RBC: 4.32 MIL/uL (ref 4.22–5.81)
RDW: 12.4 % (ref 11.5–15.5)
WBC: 6.2 10*3/uL (ref 4.0–10.5)
nRBC: 0 % (ref 0.0–0.2)

## 2021-12-28 MED ORDER — KETOROLAC TROMETHAMINE 15 MG/ML IJ SOLN: 15.0000 mg | Freq: Once | INTRAMUSCULAR | Status: DC

## 2021-12-28 MED ORDER — METHYLPREDNISOLONE 4 MG PO TBPK: ORAL_TABLET | ORAL | 0 refills | Status: DC

## 2021-12-28 NOTE — ED Triage Notes (Signed)
C/o lower back pain x 2 weeks with R leg pain, numbness, and feeling cold.  Denies injury. ?

## 2021-12-28 NOTE — ED Provider Triage Note (Signed)
Emergency Medicine Provider Triage Evaluation Note ? ?[name] , a 61 y.o. male  was evaluated in triage.  Pt complains of low back pain.  States that same has been ongoing for the past 2 to 3 weeks.  Endorses associated sharp shooting pain down his right leg with associated numbness and tingling.  States that same occurred 6 months ago and he was seen by his PCP for same, given prednisone with relief.  Denies any specific injury, does state he works at [employer] and is on his feet a lot.  No fevers or chills, no loss of bowel or bladder function, no saddle paresthesias, no IVDU. ? ?Review of Systems  ?Positive:  ?Negative: See above ? ?Physical Exam  ?BP 140/73 (BP Location: Left Arm)   Pulse (!) 59   Temp 97.6 ?F (36.4 ?C)   Resp 16   SpO2 95%  ?Gen:   Awake, no distress   ?Resp:  Normal effort  ?MSK:   Moves extremities without difficulty  ?Other:  Patient ambulatory with intact gait ? ?Medical Decision Making  ?Medically screening exam initiated at 3:28 PM.  Appropriate orders placed.  [name] was informed that the remainder of the evaluation will be completed by another provider, this initial triage assessment does not replace that evaluation, and the importance of remaining in the ED until their evaluation is complete. ? ? ?  ?Silva Bandy, PA-C ?12/28/21 1530 ? ?

## 2021-12-28 NOTE — ED Provider Notes (Signed)
?[facility] ?Provider Note ? ?CSN: [id number] ?Arrival date & time: 12/28/21 1443 ? ?Chief Complaint(s) ?Back Pain and R leg pain ? ?HPI ?[name] is a 61 y.o. male with PMH brain aneurysm status post coiling, known bilateral lower extremity neuropathy, CKD, COPD who presents emergency department for evaluation of leg pain and back pain.  Patient states that he was at work today when he felt acute onset worsening of his low back pain and right lower extremity paresthesias.  He states that he feels that his right leg is significantly colder than his left leg and feels like pins-and-needles.  Denies saddle anesthesia, weakness of the lower extremity, bowel or bladder incontinence. ? ? ?Back Pain ?Associated symptoms: numbness   ? ?Past Medical History ?Past Medical History:  ?Diagnosis Date  ? Aneurysm ([place])   ? brain  ? Arthritis   ? Atrial fibrillation ([place])   ? Bipolar disorder ([place])   ? was on meds but was taken off 2 yrs ago and none since  ? Burning pain   ? in both legs-seeing Dr.Sethi for this  ? CAD (coronary artery disease)   ? Cataracts, bilateral   ? Childhood asthma   ? Last asthma attack at age 27; History of trach at 16 months (12/17/2016)  ? Chronic kidney disease 2009  ? Chronic lower back pain   ? COPD (chronic obstructive pulmonary disease) ([place]) 06/2012  ? uses Spiriva and Albuterol daily as needed  ? Depression   ? Family history of adverse reaction to anesthesia   ? "Mom and sister have PONV"  ? GERD (gastroesophageal reflux disease)   ? H/O hiatal hernia   ? Headache(784.0)   ? "q 2-3 weeks; daily last 2 wks" (12/17/2016)  ? Hyperlipidemia   ? takes Ramipril daily  ? Hypertension   ? takes Ramipril daily  ? Joint pain   ? Joint swelling   ? 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 Nocturia   ? Numbness   ? both arms   ? Pneumonia   ? hx of-2014  ? PONV (postoperative nausea and vomiting)   ? Pt reports nausea only.  ? Sleep apnea   ? dx just this past  week   08/21/2017  ? Stroke [facility]) 2015  ? "drag left foot more since; have to wear glasses now" (12/17/2016)  ? Type II diabetes mellitus ([place])   ? takes Metformin daily   dx 2015  ? Umbilical hernia   ? Urinary frequency   ? Wears dentures   ? Wears glasses   ? and contact lenses  ? ?Patient Active Problem List  ? Diagnosis Date Noted  ? History of CVA (cerebrovascular accident) 10/19/2020  ? GI bleeding 10/18/2020  ? 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  ? Nocturnal hypoxemia due to obstructive chronic bronchitis ([place]) 12/15/2018  ? OSA and COPD overlap syndrome ([place]) 12/13/2017  ? OSA on CPAP 12/13/2017  ? COPD (chronic obstructive pulmonary disease) with chronic bronchitis ([place]) 06/22/2017  ? Class 2 obesity with alveolar hypoventilation, serious comorbidity, and body mass index (BMI) of 38.0 to 38.9 in adult [facility]) 06/22/2017  ? Cerebral embolism with transient ischemic attack (TIA) 06/22/2017  ? Postoperative hypoxemia 06/22/2017  ? Nocturia more than twice per night 06/22/2017  ? Aneurysm ([place]) 05/11/2017  ? PAF (paroxysmal atrial fibrillation) ([place]) 12/17/2016  ? Carotid stenosis, symptomatic w/o infarct 09/25/2015  ? Paresthesia and pain of right extremity 12/04/2014  ? Carotid stenosis  12/04/2014  ? Stroke, hemorrhagic ([place]) 09/01/2014  ? 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 TIA (transient ischemic attack) 08/14/2014  ? Right arm weakness   ? Cerebral hemorrhage ([place]) 08/11/2014  ? ICH (intracerebral hemorrhage) ([place]) 08/11/2014  ? Brain aneurysm 08/08/2014  ? Chest pain at rest 07/08/2014  ? right ICAO (internal carotid artery occlusion) 07/06/2014  ? DM type 2 causing vascular disease ([place]) 07/06/2014  ? Hyperlipidemia LDL goal <100 07/06/2014  ? Morbid obesity ([place]) 07/06/2014  ? Acute CVA (cerebrovascular accident) ([place]) 07/03/2014  ? GERD (gastroesophageal reflux disease) 10/19/2012  ? COPD (chronic obstructive pulmonary disease) ([place]) 06/12/2012  ? ?Home  Medication(s) ?Prior to Admission medications   ?Medication Sig Start Date End Date Taking? Authorizing Provider  ?albuterol (PROVENTIL HFA;VENTOLIN HFA) 108 (90 BASE) MCG/ACT inhaler Inhale 2 puffs into the lungs every 6 (six) hours as needed for wheezing or shortness of breath.     [provider]  ?amiodarone (PACERONE) 200 MG tablet Take 100 mg by mouth daily.    [provider]  ?apixaban (ELIQUIS) 5 MG TABS tablet Take 1 tablet (5 mg total) by mouth 2 (two) times daily. 10/19/20   Dwyane Dee, MD  ?atorvastatin (LIPITOR) 20 MG tablet Take 20 mg by mouth at bedtime.     [provider]  ?clopidogrel (PLAVIX) 75 MG tablet Take 0.5 tablets (37.5 mg total) by mouth daily. 10/19/20   Dwyane Dee, MD  ?furosemide (LASIX) 40 MG tablet Take 40 mg by mouth daily.    [provider]  ?KLOR-CON M10 10 MEQ tablet Take 20 mEq by mouth daily. 08/22/20   [provider]  ?metFORMIN (GLUCOPHAGE) 500 MG tablet Take 1 tablet (500 mg total) by mouth 2 (two) times daily with a meal. 07/06/14   Debbe Odea, MD  ?nitroGLYCERIN (NITROSTAT) 0.4 MG SL tablet Place 1 tablet (0.4 mg total) under the tongue every 5 (five) minutes x 3 doses as needed for chest pain. 07/08/14   Dixie Dials, MD  ?pantoprazole (PROTONIX) 40 MG tablet Take 1 tablet (40 mg total) by mouth daily as needed (for heartburn). 10/19/20   Dwyane Dee, MD  ?ramipril (ALTACE) 5 MG capsule Take 5 mg by mouth daily. 08/22/20   [provider]  ?traMADol (ULTRAM) 50 MG tablet Take 1-2 tablets (50-100 mg total) by mouth every 6 (six) hours as needed for moderate pain or severe pain. 09/24/20   Michael Boston, MD  ?umeclidinium-vilanterol [facility] ELLIPTA) 62.5-25 MCG/INH AEPB Inhale 2 puffs into the lungs daily.    [provider]  ?                                                                                                                                  ?Past Surgical History ?Past Surgical History:   ?Procedure Laterality Date  ? ANEURYSM COILING  2015  ? CARDIOVERSION N/A 12/18/2016  ?  Procedure: CARDIOVERSION;  Surgeon: Dixie Dials, MD;  [facility] ENDOSCOPY;  Service: Cardiovascular;  Laterality: N/A;  ? COLONOSCOPY    ? COLONOSCOPY N/A 04/08/2018  ? Procedure: COLONOSCOPY;  Surgeon: Carol Ada, MD;  Location: [facility] ENDOSCOPY;  Service: Endoscopy;  Laterality: N/A;  ? COLONOSCOPY N/A 10/18/2020  ? Procedure: COLONOSCOPY;  Surgeon: Carol Ada, MD;  [facility] ENDOSCOPY;  Service: Endoscopy;  Laterality: N/A;  ? ESOPHAGOGASTRODUODENOSCOPY N/A 04/08/2018  ? Procedure: ESOPHAGOGASTRODUODENOSCOPY (EGD);  Surgeon: Carol Ada, MD;  Location: [facility];  Service: Endoscopy;  Laterality: N/A;  ? EXCISIONAL HEMORRHOIDECTOMY  1980s  ? "soon after rectal OR"  ? GANGLION CYST EXCISION Right   ? HEMOSTASIS CLIP PLACEMENT  10/18/2020  ? Procedure: HEMOSTASIS CLIP PLACEMENT;  Surgeon: Carol Ada, MD;  [facility] ENDOSCOPY;  Service: Endoscopy;;  ? HOT HEMOSTASIS N/A 04/08/2018  ? Procedure: HOT HEMOSTASIS (ARGON PLASMA COAGULATION/BICAP);  Surgeon: Carol Ada, MD;  Location: [facility];  Service: Endoscopy;  Laterality: N/A;  ? HOT HEMOSTASIS N/A 10/18/2020  ? Procedure: HOT HEMOSTASIS (ARGON PLASMA COAGULATION/BICAP);  Surgeon: Carol Ada, MD;  Location: [facility] ENDOSCOPY;  Service: Endoscopy;  Laterality: N/A;  ? IR 3D INDEPENDENT WKST  05/10/2017  ? IR 3D INDEPENDENT WKST  06/28/2017  ? IR ANGIO INTRA EXTRACRAN SEL COM CAROTID INNOMINATE BILAT MOD SED  04/22/2017  ? IR ANGIO INTRA EXTRACRAN SEL COM CAROTID INNOMINATE BILAT MOD SED  11/22/2017  ? IR ANGIO INTRA EXTRACRAN SEL COM CAROTID INNOMINATE BILAT MOD SED  05/13/2018  ? IR ANGIO INTRA EXTRACRAN SEL INTERNAL CAROTID UNI L MOD SED  12/27/2017  ? IR ANGIO INTRA EXTRACRAN SEL INTERNAL CAROTID UNI R MOD SED  05/10/2017  ? IR ANGIO INTRA EXTRACRAN SEL INTERNAL CAROTID UNI R MOD SED  06/28/2017  ? IR ANGIO VERTEBRAL SEL SUBCLAVIAN INNOMINATE UNI L MOD SED  04/22/2017  ?  IR ANGIO VERTEBRAL SEL SUBCLAVIAN INNOMINATE UNI R MOD SED  06/28/2017  ? IR ANGIO VERTEBRAL SEL SUBCLAVIAN INNOMINATE UNI R MOD SED  11/22/2017  ? IR ANGIO VERTEBRAL SEL SUBCLAVIAN INNOMINATE UNI R MOD SED  05/13/2018

## 2021-12-28 NOTE — ED Notes (Addendum)
EDP into room. Pt alert, NAD, calm, interactive, resps e/u, speaking in clear complete sentences. Pt not claustrophobic. ?

## 2022-01-02 ENCOUNTER — Observation Stay (HOSPITAL_COMMUNITY): Payer: Medicare HMO

## 2022-01-02 ENCOUNTER — Observation Stay (HOSPITAL_COMMUNITY)
Admission: EM | Admit: 2022-01-02 | Discharge: 2022-01-03 | Disposition: A | Discharge status: Home / Self Care | Payer: Medicare HMO | Attending: Family Medicine | Admitting: Family Medicine

## 2022-01-02 ENCOUNTER — Other Ambulatory Visit: Payer: Self-pay

## 2022-01-02 ENCOUNTER — Encounter (HOSPITAL_COMMUNITY): Payer: Self-pay

## 2022-01-02 ENCOUNTER — Emergency Department (HOSPITAL_COMMUNITY): Payer: Medicare HMO

## 2022-01-02 DIAGNOSIS — G4733 Obstructive sleep apnea (adult) (pediatric): Secondary | ICD-10-CM | POA: Diagnosis present

## 2022-01-02 DIAGNOSIS — E860 Dehydration: Secondary | ICD-10-CM | POA: Insufficient documentation

## 2022-01-02 DIAGNOSIS — Z9989 Dependence on other enabling machines and devices: Secondary | ICD-10-CM | POA: Insufficient documentation

## 2022-01-02 DIAGNOSIS — E1159 Type 2 diabetes mellitus with other circulatory complications: Secondary | ICD-10-CM | POA: Diagnosis not present

## 2022-01-02 DIAGNOSIS — Z823 Family history of stroke: Secondary | ICD-10-CM | POA: Insufficient documentation

## 2022-01-02 DIAGNOSIS — K219 Gastro-esophageal reflux disease without esophagitis: Secondary | ICD-10-CM | POA: Insufficient documentation

## 2022-01-02 DIAGNOSIS — I129 Hypertensive chronic kidney disease with stage 1 through stage 4 chronic kidney disease, or unspecified chronic kidney disease: Secondary | ICD-10-CM | POA: Insufficient documentation

## 2022-01-02 DIAGNOSIS — I1310 Hypertensive heart and chronic kidney disease without heart failure, with stage 1 through stage 4 chronic kidney disease, or unspecified chronic kidney disease: Secondary | ICD-10-CM | POA: Insufficient documentation

## 2022-01-02 DIAGNOSIS — J439 Emphysema, unspecified: Secondary | ICD-10-CM | POA: Diagnosis not present

## 2022-01-02 DIAGNOSIS — R42 Dizziness and giddiness: Secondary | ICD-10-CM | POA: Diagnosis not present

## 2022-01-02 DIAGNOSIS — Q2739 Arteriovenous malformation, other site: Secondary | ICD-10-CM | POA: Insufficient documentation

## 2022-01-02 DIAGNOSIS — I2510 Atherosclerotic heart disease of native coronary artery without angina pectoris: Secondary | ICD-10-CM | POA: Diagnosis not present

## 2022-01-02 DIAGNOSIS — Z7901 Long term (current) use of anticoagulants: Secondary | ICD-10-CM | POA: Insufficient documentation

## 2022-01-02 DIAGNOSIS — Z8673 Personal history of transient ischemic attack (TIA), and cerebral infarction without residual deficits: Secondary | ICD-10-CM

## 2022-01-02 DIAGNOSIS — R55 Syncope and collapse: Secondary | ICD-10-CM | POA: Insufficient documentation

## 2022-01-02 DIAGNOSIS — Z7902 Long term (current) use of antithrombotics/antiplatelets: Secondary | ICD-10-CM | POA: Diagnosis not present

## 2022-01-02 DIAGNOSIS — Z79899 Other long term (current) drug therapy: Secondary | ICD-10-CM | POA: Insufficient documentation

## 2022-01-02 DIAGNOSIS — H539 Unspecified visual disturbance: Secondary | ICD-10-CM | POA: Diagnosis not present

## 2022-01-02 DIAGNOSIS — Z6837 Body mass index (BMI) 37.0-37.9, adult: Secondary | ICD-10-CM | POA: Diagnosis not present

## 2022-01-02 DIAGNOSIS — A080 Rotaviral enteritis: Secondary | ICD-10-CM | POA: Insufficient documentation

## 2022-01-02 DIAGNOSIS — R079 Chest pain, unspecified: Secondary | ICD-10-CM | POA: Diagnosis not present

## 2022-01-02 DIAGNOSIS — I10 Essential (primary) hypertension: Secondary | ICD-10-CM

## 2022-01-02 DIAGNOSIS — M545 Low back pain, unspecified: Secondary | ICD-10-CM | POA: Insufficient documentation

## 2022-01-02 DIAGNOSIS — Z7984 Long term (current) use of oral hypoglycemic drugs: Secondary | ICD-10-CM | POA: Diagnosis not present

## 2022-01-02 DIAGNOSIS — Z7982 Long term (current) use of aspirin: Secondary | ICD-10-CM | POA: Insufficient documentation

## 2022-01-02 DIAGNOSIS — H538 Other visual disturbances: Secondary | ICD-10-CM | POA: Insufficient documentation

## 2022-01-02 DIAGNOSIS — M5431 Sciatica, right side: Secondary | ICD-10-CM | POA: Insufficient documentation

## 2022-01-02 DIAGNOSIS — N189 Chronic kidney disease, unspecified: Secondary | ICD-10-CM | POA: Diagnosis not present

## 2022-01-02 DIAGNOSIS — Z8701 Personal history of pneumonia (recurrent): Secondary | ICD-10-CM | POA: Insufficient documentation

## 2022-01-02 DIAGNOSIS — R252 Cramp and spasm: Secondary | ICD-10-CM | POA: Insufficient documentation

## 2022-01-02 DIAGNOSIS — N179 Acute kidney failure, unspecified: Secondary | ICD-10-CM | POA: Diagnosis not present

## 2022-01-02 DIAGNOSIS — G8929 Other chronic pain: Secondary | ICD-10-CM | POA: Insufficient documentation

## 2022-01-02 DIAGNOSIS — E1122 Type 2 diabetes mellitus with diabetic chronic kidney disease: Secondary | ICD-10-CM | POA: Insufficient documentation

## 2022-01-02 DIAGNOSIS — E662 Morbid (severe) obesity with alveolar hypoventilation: Secondary | ICD-10-CM | POA: Diagnosis not present

## 2022-01-02 DIAGNOSIS — R9431 Abnormal electrocardiogram [ECG] [EKG]: Secondary | ICD-10-CM | POA: Insufficient documentation

## 2022-01-02 DIAGNOSIS — Z8249 Family history of ischemic heart disease and other diseases of the circulatory system: Secondary | ICD-10-CM | POA: Insufficient documentation

## 2022-01-02 DIAGNOSIS — Z8719 Personal history of other diseases of the digestive system: Secondary | ICD-10-CM | POA: Insufficient documentation

## 2022-01-02 DIAGNOSIS — R0602 Shortness of breath: Secondary | ICD-10-CM | POA: Diagnosis present

## 2022-01-02 DIAGNOSIS — R062 Wheezing: Secondary | ICD-10-CM | POA: Diagnosis not present

## 2022-01-02 DIAGNOSIS — Z87891 Personal history of nicotine dependence: Secondary | ICD-10-CM | POA: Insufficient documentation

## 2022-01-02 DIAGNOSIS — J449 Chronic obstructive pulmonary disease, unspecified: Secondary | ICD-10-CM | POA: Diagnosis present

## 2022-01-02 DIAGNOSIS — R197 Diarrhea, unspecified: Secondary | ICD-10-CM | POA: Diagnosis not present

## 2022-01-02 DIAGNOSIS — I4510 Unspecified right bundle-branch block: Secondary | ICD-10-CM | POA: Insufficient documentation

## 2022-01-02 DIAGNOSIS — E1136 Type 2 diabetes mellitus with diabetic cataract: Secondary | ICD-10-CM | POA: Insufficient documentation

## 2022-01-02 DIAGNOSIS — I444 Left anterior fascicular block: Secondary | ICD-10-CM | POA: Insufficient documentation

## 2022-01-02 DIAGNOSIS — I480 Paroxysmal atrial fibrillation: Secondary | ICD-10-CM | POA: Diagnosis not present

## 2022-01-02 DIAGNOSIS — Q631 Lobulated, fused and horseshoe kidney: Secondary | ICD-10-CM | POA: Insufficient documentation

## 2022-01-02 DIAGNOSIS — I493 Ventricular premature depolarization: Secondary | ICD-10-CM | POA: Diagnosis not present

## 2022-01-02 DIAGNOSIS — Z6838 Body mass index (BMI) 38.0-38.9, adult: Secondary | ICD-10-CM | POA: Insufficient documentation

## 2022-01-02 LAB — URINALYSIS, ROUTINE W REFLEX MICROSCOPIC
Bilirubin Urine: NEGATIVE
Bilirubin Urine: NEGATIVE
Glucose, UA: NEGATIVE mg/dL
Glucose, UA: NEGATIVE mg/dL
Hgb urine dipstick: NEGATIVE
Hgb urine dipstick: NEGATIVE
Ketones, ur: NEGATIVE mg/dL
Ketones, ur: NEGATIVE mg/dL
Leukocytes,Ua: NEGATIVE
Leukocytes,Ua: NEGATIVE
Nitrite: NEGATIVE
Nitrite: NEGATIVE
Protein, ur: 100 mg/dL — AB
Protein, ur: NEGATIVE mg/dL
Specific Gravity, Urine: 1.018 (ref 1.005–1.030)
Specific Gravity, Urine: 1.011 (ref 1.005–1.030)
pH: 5 (ref 5.0–8.0)
pH: 7 (ref 5.0–8.0)

## 2022-01-02 LAB — COMPREHENSIVE METABOLIC PANEL
ALT: 22 U/L (ref 0–44)
AST: 25 U/L (ref 15–41)
Albumin: 4.6 g/dL (ref 3.5–5.0)
Alkaline Phosphatase: 67 U/L (ref 38–126)
Anion gap: 11 (ref 5–15)
BUN: 16 mg/dL (ref 6–20)
CO2: 24 mmol/L (ref 22–32)
Calcium: 9.8 mg/dL (ref 8.9–10.3)
Chloride: 99 mmol/L (ref 98–111)
Creatinine, Ser: 2.09 mg/dL — ABNORMAL HIGH (ref 0.61–1.24)
GFR, Estimated: 36 mL/min — ABNORMAL LOW (ref >60)
Glucose, Bld: 131 mg/dL — ABNORMAL HIGH (ref 70–99)
Potassium: 3.9 mmol/L (ref 3.5–5.1)
Sodium: 134 mmol/L — ABNORMAL LOW (ref 135–145)
Total Bilirubin: 0.9 mg/dL (ref 0.3–1.2)
Total Protein: 7.6 g/dL (ref 6.5–8.1)

## 2022-01-02 LAB — PROTEIN / CREATININE RATIO, URINE
Creatinine, Urine: 84.19 mg/dL
Protein Creatinine Ratio: 0.1 mg/mg{Cre} (ref 0.00–0.15)
Total Protein, Urine: 8 mg/dL

## 2022-01-02 LAB — CBC WITH DIFFERENTIAL/PLATELET
Abs Immature Granulocytes: 0.03 10*3/uL (ref 0.00–0.07)
Basophils Absolute: 0 10*3/uL (ref 0.0–0.1)
Basophils Relative: 0 %
Eosinophils Absolute: 0.1 10*3/uL (ref 0.0–0.5)
Eosinophils Relative: 1 %
HCT: 47 % (ref 39.0–52.0)
Hemoglobin: 15.7 g/dL (ref 13.0–17.0)
Immature Granulocytes: 0 %
Lymphocytes Relative: 16 %
Lymphs Abs: 1.7 10*3/uL (ref 0.7–4.0)
MCH: 30.1 pg (ref 26.0–34.0)
MCHC: 33.4 g/dL (ref 30.0–36.0)
MCV: 90 fL (ref 80.0–100.0)
Monocytes Absolute: 0.9 10*3/uL (ref 0.1–1.0)
Monocytes Relative: 9 %
Neutro Abs: 7.9 10*3/uL — ABNORMAL HIGH (ref 1.7–7.7)
Neutrophils Relative %: 74 %
Platelets: 335 10*3/uL (ref 150–400)
RBC: 5.22 MIL/uL (ref 4.22–5.81)
RDW: 12.1 % (ref 11.5–15.5)
WBC: 10.7 10*3/uL — ABNORMAL HIGH (ref 4.0–10.5)
nRBC: 0 % (ref 0.0–0.2)

## 2022-01-02 LAB — MAGNESIUM: Magnesium: 1.8 mg/dL (ref 1.7–2.4)

## 2022-01-02 LAB — GLUCOSE, CAPILLARY: Glucose-Capillary: 106 mg/dL — ABNORMAL HIGH (ref 70–99)

## 2022-01-02 LAB — TROPONIN I (HIGH SENSITIVITY)
Troponin I (High Sensitivity): 8 ng/L (ref <18)
Troponin I (High Sensitivity): 5 ng/L (ref <18)

## 2022-01-02 LAB — SODIUM, URINE, RANDOM: Sodium, Ur: 82 mmol/L

## 2022-01-02 LAB — CK: Total CK: 121 U/L (ref 49–397)

## 2022-01-02 LAB — CBG MONITORING, ED: Glucose-Capillary: 127 mg/dL — ABNORMAL HIGH (ref 70–99)

## 2022-01-02 LAB — CREATININE, URINE, RANDOM: Creatinine, Urine: 84.18 mg/dL

## 2022-01-02 MED ORDER — CLOPIDOGREL BISULFATE 75 MG PO TABS
37.5000 mg | ORAL_TABLET | Freq: Every day | ORAL | Status: DC
  Administered 2022-01-03: 37.5 mg via ORAL
  Filled 2022-01-02: qty 1

## 2022-01-02 MED ORDER — ACETAMINOPHEN 325 MG PO TABS
650.0000 mg | ORAL_TABLET | Freq: Four times a day (QID) | ORAL | Status: DC | PRN
Start: 2022-01-02 — End: 2022-01-03

## 2022-01-02 MED ORDER — PANTOPRAZOLE SODIUM 40 MG PO TBEC: 40.0000 mg | DELAYED_RELEASE_TABLET | Freq: Every day | ORAL | Status: DC | PRN

## 2022-01-02 MED ORDER — ONDANSETRON HCL 4 MG/2ML IJ SOLN: 4.0000 mg | Freq: Four times a day (QID) | INTRAMUSCULAR | Status: DC | PRN

## 2022-01-02 MED ORDER — ALBUTEROL SULFATE (2.5 MG/3ML) 0.083% IN NEBU: 3.0000 mL | INHALATION_SOLUTION | RESPIRATORY_TRACT | Status: DC | PRN

## 2022-01-02 MED ORDER — ACETAMINOPHEN 650 MG RE SUPP
650.0000 mg | Freq: Four times a day (QID) | RECTAL | Status: DC | PRN
Start: 2022-01-02 — End: 2022-01-03

## 2022-01-02 MED ORDER — POTASSIUM CHLORIDE CRYS ER 20 MEQ PO TBCR
20.0000 meq | EXTENDED_RELEASE_TABLET | Freq: Every day | ORAL | Status: DC
  Administered 2022-01-03: 20 meq via ORAL
  Filled 2022-01-02: qty 1

## 2022-01-02 MED ORDER — INSULIN ASPART 100 UNIT/ML IJ SOLN: 0.0000 [IU] | Freq: Every day | INTRAMUSCULAR | Status: DC

## 2022-01-02 MED ORDER — ATORVASTATIN CALCIUM 10 MG PO TABS
20.0000 mg | ORAL_TABLET | Freq: Every day | ORAL | Status: DC
  Administered 2022-01-02: 20 mg via ORAL
  Filled 2022-01-02: qty 2

## 2022-01-02 MED ORDER — INSULIN ASPART 100 UNIT/ML IJ SOLN: 0.0000 [IU] | Freq: Three times a day (TID) | INTRAMUSCULAR | Status: DC

## 2022-01-02 MED ORDER — APIXABAN 5 MG PO TABS
5.0000 mg | ORAL_TABLET | Freq: Two times a day (BID) | ORAL | Status: DC
  Administered 2022-01-02 – 2022-01-03 (×2): 5 mg via ORAL
  Filled 2022-01-02 (×2): qty 1

## 2022-01-02 MED ORDER — AMIODARONE HCL 200 MG PO TABS
100.0000 mg | ORAL_TABLET | Freq: Every day | ORAL | Status: DC
  Administered 2022-01-03: 100 mg via ORAL
  Filled 2022-01-02: qty 1

## 2022-01-02 MED ORDER — UMECLIDINIUM-VILANTEROL 62.5-25 MCG/ACT IN AEPB
2.0000 | INHALATION_SPRAY | Freq: Every day | RESPIRATORY_TRACT | Status: DC
  Filled 2022-01-02 (×2): qty 14

## 2022-01-02 MED ORDER — ONDANSETRON HCL 4 MG PO TABS: 4.0000 mg | ORAL_TABLET | Freq: Four times a day (QID) | ORAL | Status: DC | PRN

## 2022-01-02 MED ORDER — LACTATED RINGERS IV BOLUS
1000.0000 mL | Freq: Once | INTRAVENOUS | Status: AC
  Administered 2022-01-02: 1000 mL via INTRAVENOUS

## 2022-01-02 MED ORDER — BUDESONIDE 0.25 MG/2ML IN SUSP
0.2500 mg | Freq: Two times a day (BID) | RESPIRATORY_TRACT | Status: DC
  Administered 2022-01-02 – 2022-01-03 (×2): 0.25 mg via RESPIRATORY_TRACT
  Filled 2022-01-02 (×2): qty 2

## 2022-01-02 MED ORDER — LACTATED RINGERS IV SOLN: INTRAVENOUS | Status: DC

## 2022-01-02 NOTE — ED Notes (Signed)
ED TO INPATIENT HANDOFF REPORT ? ?ED Nurse Name and Phone #: Delice Bison, RN ? ?S ?Name/Age/Gender ?[name] ?61 y.o. ?male ?Room/Bed: 044C/044C ? ?Code Status ?  Code Status: Prior ? ?Home/SNF/Other ?Home ?Patient oriented to: self, place, time, and situation ?Is this baseline? Yes  ? ?Triage Complete: Triage complete  ?Chief Complaint ?AKI (acute kidney injury) (HCC) [N17.9] ? ?Triage Note ?Pt arrived POV c/o SHOB. PT states it started out with me being dizzy and blurred vision that has resolved but now I feel SHOB. Pt denies any pain at this time.   ? ?Allergies ?Allergies  ?Allergen Reactions  ? Tylox [Oxycodone-Acetaminophen] Hives, Other (See Comments) and Rash  ?  Sweating, shaking ?Reaction: Tremors and diaphoresis   ? Gabapentin   ?  Gained weight and retained fluid   ? Adhesive [Tape] Rash  ?  States IV tape is fine  ? ? ?Level of Care/Admitting Diagnosis ?ED Disposition   ? ? ED Disposition  ?Admit  ? Condition  ?--  ? Comment  ?Hospital [facility] [[id number]] ? Level of Care: Med-Surg [16] ? May place patient in observation at [facility] or [facility] if equivalent level of care is available:: Yes ? Covid Evaluation: Asymptomatic - no recent exposure (last 10 days) testing not required ? Diagnosis: AKI (acute kidney injury) (HCC) [509326] ? Admitting Physician: Alberteen Sam [[id number]] ? Attending Physician: Alberteen Sam [[id number]] ?  ?  ? ?  ? ? ?B ?Medical/Surgery History ?Past Medical History:  ?Diagnosis Date  ? Aneurysm (HCC)   ? brain  ? Arthritis   ? Atrial fibrillation (HCC)   ? Bipolar disorder (HCC)   ? was on meds but was taken off 2 yrs ago and none since  ? Burning pain   ? in both legs-seeing Dr.Sethi for this  ? CAD (coronary artery disease)   ? Cataracts, bilateral   ? Childhood asthma   ? Last asthma attack at age 18; History of trach at 16 months (12/17/2016)  ? Chronic kidney disease 2009  ? Chronic lower back pain   ? COPD (chronic obstructive  pulmonary disease) (HCC) 06/2012  ? uses Spiriva and Albuterol daily as needed  ? Depression   ? Family history of adverse reaction to anesthesia   ? "Mom and sister have PONV"  ? GERD (gastroesophageal reflux disease)   ? H/O hiatal hernia   ? Headache(784.0)   ? "q 2-3 weeks; daily last 2 wks" (12/17/2016)  ? Hyperlipidemia   ? takes Ramipril daily  ? Hypertension   ? takes Ramipril daily  ? Joint pain   ? Joint swelling   ? 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 Nocturia   ? Numbness   ? both arms   ? Pneumonia   ? hx of-2014  ? PONV (postoperative nausea and vomiting)   ? Pt reports nausea only.  ? Sleep apnea   ? dx just this past week   08/21/2017  ? Stroke [facility]) 2015  ? "drag left foot more since; have to wear glasses now" (12/17/2016)  ? Type II diabetes mellitus (HCC)   ? takes Metformin daily   dx 2015  ? Umbilical hernia   ? Urinary frequency   ? Wears dentures   ? Wears glasses   ? and contact lenses  ? ?Past Surgical History:  ?Procedure Laterality Date  ? ANEURYSM COILING  2015  ? CARDIOVERSION N/A 12/18/2016  ? Procedure:  CARDIOVERSION;  Surgeon: Orpah Cobb, MD;  Location[facility] ENDOSCOPY;  Service: Cardiovascular;  Laterality: N/A;  ? COLONOSCOPY    ? COLONOSCOPY N/A 04/08/2018  ? Procedure: COLONOSCOPY;  Surgeon: Jeani Hawking, MD;  [facility] ENDOSCOPY;  Service: Endoscopy;  Laterality: N/A;  ? COLONOSCOPY N/A 10/18/2020  ? Procedure: COLONOSCOPY;  Surgeon: Jeani Hawking, MD;  Location[facility] ENDOSCOPY;  Service: Endoscopy;  Laterality: N/A;  ? ESOPHAGOGASTRODUODENOSCOPY N/A 04/08/2018  ? Procedure: ESOPHAGOGASTRODUODENOSCOPY (EGD);  Surgeon: Jeani Hawking, MD;  Location: [facility] ENDOSCOPY;  Service: Endoscopy;  Laterality: N/A;  ? EXCISIONAL HEMORRHOIDECTOMY  1980s  ? "soon after rectal OR"  ? GANGLION CYST EXCISION Right   ? HEMOSTASIS CLIP PLACEMENT  10/18/2020  ? Procedure: HEMOSTASIS CLIP PLACEMENT;  Surgeon: Jeani Hawking, MD;  [facility] ENDOSCOPY;  Service: Endoscopy;;  ? HOT HEMOSTASIS N/A 04/08/2018   ? Procedure: HOT HEMOSTASIS (ARGON PLASMA COAGULATION/BICAP);  Surgeon: Jeani Hawking, MD;  Location: [facility] ENDOSCOPY;  Service: Endoscopy;  Laterality: N/A;  ? HOT HEMOSTASIS N/A 10/18/2020  ? Procedure: HOT HEMOSTASIS (ARGON PLASMA COAGULATION/BICAP);  Surgeon: Jeani Hawking, MD;  Location: [facility]OSCOPY;  Service: Endoscopy;  Laterality: N/A;  ? IR 3D INDEPENDENT WKST  05/10/2017  ? IR 3D INDEPENDENT WKST  06/28/2017  ? IR ANGIO INTRA EXTRACRAN SEL COM CAROTID INNOMINATE BILAT MOD SED  04/22/2017  ? IR ANGIO INTRA EXTRACRAN SEL COM CAROTID INNOMINATE BILAT MOD SED  11/22/2017  ? IR ANGIO INTRA EXTRACRAN SEL COM CAROTID INNOMINATE BILAT MOD SED  05/13/2018  ? IR ANGIO INTRA EXTRACRAN SEL INTERNAL CAROTID UNI L MOD SED  12/27/2017  ? IR ANGIO INTRA EXTRACRAN SEL INTERNAL CAROTID UNI R MOD SED  05/10/2017  ? IR ANGIO INTRA EXTRACRAN SEL INTERNAL CAROTID UNI R MOD SED  06/28/2017  ? IR ANGIO VERTEBRAL SEL SUBCLAVIAN INNOMINATE UNI L MOD SED  04/22/2017  ? IR ANGIO VERTEBRAL SEL SUBCLAVIAN INNOMINATE UNI R MOD SED  06/28/2017  ? IR ANGIO VERTEBRAL SEL SUBCLAVIAN INNOMINATE UNI R MOD SED  11/22/2017  ? IR ANGIO VERTEBRAL SEL SUBCLAVIAN INNOMINATE UNI R MOD SED  05/13/2018  ? IR ANGIO VERTEBRAL SEL VERTEBRAL UNI R MOD SED  04/22/2017  ? IR ANGIOGRAM FOLLOW UP STUDY  05/10/2017  ? IR ANGIOGRAM FOLLOW UP STUDY  06/28/2017  ? IR ANGIOGRAM FOLLOW UP STUDY  12/27/2017  ? IR NEURO EACH ADD'L AFTER BASIC UNI LEFT (MS)  12/27/2017  ? IR NEURO EACH ADD'L AFTER BASIC UNI RIGHT (MS)  05/10/2017  ? IR NEURO EACH ADD'L AFTER BASIC UNI RIGHT (MS)  06/28/2017  ? IR RADIOLOGIST EVAL & MGMT  04/30/2017  ? IR RADIOLOGIST EVAL & MGMT  06/15/2017  ? IR RADIOLOGIST EVAL & MGMT  07/13/2017  ? IR RADIOLOGIST EVAL & MGMT  01/11/2018  ? IR TRANSCATH/EMBOLIZ  05/10/2017  ? IR TRANSCATH/EMBOLIZ  06/28/2017  ? IR TRANSCATH/EMBOLIZ  12/27/2017  ? MULTIPLE TOOTH EXTRACTIONS    ? POLYPECTOMY  04/08/2018  ? Procedure: POLYPECTOMY;  Surgeon: Jeani Hawking, MD;  Location: [facility] ENDOSCOPY;   Service: Endoscopy;;  ? RADIOLOGY WITH ANESTHESIA N/A 08/08/2014  ? Procedure: RADIOLOGY WITH ANESTHESIA EMBOLIZATION;  Surgeon: Medication Radiologist, MD;  Location: [facility] OR;  Service: Radiology;  Laterality: N/A;  ? RADIOLOGY WITH ANESTHESIA N/A 11/21/2014  ? Procedure: RADIOLOGY WITH ANESTHESIA;  Surgeon: Oneal Grout, MD;  Location: [facility] OR;  Service: Radiology;  Laterality: N/A;  ? RADIOLOGY WITH ANESTHESIA N/A 02/27/2015  ? Procedure: RADIOLOGY WITH ANESTHESIA;  Surgeon: [name], MD;  Location: [facility]  OR;  Service: Radiology;  Laterality: N/A;  ? RADIOLOGY WITH ANESTHESIA N/A 09/25/2015  ? Procedure: RADIOLOGY WITH ANESTHESIA;  Surgeon: Julieanne CottonSanjeev Deveshwar, MD;  Location[facility] OR;  Service: Radiology;  Laterality: N/A;  ? RADIOLOGY WITH ANESTHESIA N/A 05/10/2017  ? Procedure: EMBOLIZATION;  Surgeon: Julieanne Cottoneveshwar, Sanjeev, MD;  Location[facility] OR;  Service: Radiology;  Laterality: N/A;  ? RADIOLOGY WITH ANESTHESIA N/A 06/28/2017  ? Procedure: RADIOLOGY WITH ANESTHESIA-EMBOLIZATION;  Surgeon: Julieanne Cottoneveshwar, Sanjeev, MD;  Location: [facility] OR;  Service: Radiology;  Laterality: N/A;  ? RADIOLOGY WITH ANESTHESIA N/A 08/26/2017  ? Procedure: RADIOLOGY WITH ANESTHESIA EMBOLIZATION;  Surgeon: Julieanne Cottoneveshwar, Sanjeev, MD;  Location: [facility] OR;  Service: Radiology;  Laterality: N/A;  ? RADIOLOGY WITH ANESTHESIA N/A 11/22/2017  ? Procedure: EMBOLIZATION;  Surgeon: Julieanne Cottoneveshwar, Sanjeev, MD;  [facility] OR;  Service: Radiology;  Laterality: N/A;  ? RADIOLOGY WITH ANESTHESIA N/A 12/27/2017  ? Procedure: EMBOLIZATION;  Surgeon: Julieanne Cottoneveshwar, Sanjeev, MD;  Location[facility] OR;  Service: Radiology;  Laterality: N/A;  ? RECTAL SURGERY  1980s  ? "tore it lifting heavy furniture; sewed it back together"  ? SHOULDER ARTHROSCOPY WITH OPEN ROTATOR CUFF REPAIR Right 2016  ? SHOULDER ARTHROSCOPY WITH ROTATOR CUFF REPAIR Left 1996  ? SHOULDER OPEN ROTATOR CUFF REPAIR Left 1997  ? "took out 8inches of my collarbone"  ? TEE WITHOUT CARDIOVERSION N/A 12/18/2016  ?  Procedure: TRANSESOPHAGEAL ECHOCARDIOGRAM (TEE);  Surgeon: Orpah CobbAjay Kadakia, MD;  [facility] END[facility];  Service: Cardiovascular;  Laterality: N/A;  ? TRACHEOSTOMY  1964  ? at age 60 months old; for asthma  ? TRACHEO

## 2022-01-02 NOTE — Assessment & Plan Note (Signed)
Rate controlled, in sinus rhythm currently ?- Continue amiodarone, Eliquis ?

## 2022-01-02 NOTE — ED Triage Notes (Signed)
Pt arrived POV c/o SHOB. PT states it started out with me being dizzy and blurred vision that has resolved but now I feel SHOB. Pt denies any pain at this time.  ?

## 2022-01-02 NOTE — H&P (Signed)
?History and Physical  ? ? ?Patient: [name] [id number] DOB: 1961/08/03 ?DOA: 01/02/2022 ?DOS: the patient was seen and examined on 01/02/2022 ?PCP: Bernerd Limbo, MD  ?Patient coming from: Home ? ?Chief Complaint:  ?Chief Complaint  ?Patient presents with  ? Dizziness  ? ? ? ? ? ?HPI:  ?[name] is a 61 y.o. 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 ? ?Patient has had diarrhea for the last few days, watery yellow.  Then today at work, he felt clammy, sweaty, dizzy, weak, nearly passed out, had to sit down, then when he got up he felt it again so he came to the ER. ? 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 ? ?He was given fluids and the hospitalist service were asked to evaluate for acute kidney injury. ? ? ? ? ?Review of Systems: Review of Systems  ?Constitutional:  Negative for chills, fever and malaise/fatigue.  ?Respiratory:  Positive for shortness of breath and wheezing. Negative for cough, hemoptysis and sputum production.   ?Cardiovascular:  Positive for chest pain (noncardiac, sharp, fleeting).  ?Musculoskeletal:  Positive for back pain.  ?Neurological:  Positive for dizziness and tingling (in right leg, being worked up by Neuro as sciatica). Negative for sensory change, speech change, focal weakness, seizures, loss of consciousness, weakness and headaches.  ?All other systems reviewed and are negative. ? ? ? ? ? ?Past Medical History:  ?Diagnosis Date  ? Aneurysm ([place])   ? brain  ? Arthritis   ? Atrial fibrillation ([place])   ? Bipolar disorder ([place])   ? was on meds but was taken off 2 yrs ago and none since  ? Burning pain   ? in both  legs-seeing Dr.Sethi for this  ? CAD (coronary artery disease)   ? Cataracts, bilateral   ? Childhood asthma   ? Last asthma attack at age 47; History of trach at 16 months (12/17/2016)  ? Chronic kidney disease 2009  ? Chronic lower back pain   ? COPD (chronic obstructive pulmonary disease) ([place]) 06/2012  ? uses Spiriva and Albuterol daily as needed  ? Depression   ? Family history of adverse reaction to anesthesia   ? "Mom and sister have PONV"  ? GERD (gastroesophageal reflux disease)   ? H/O hiatal hernia   ? Headache(784.0)   ? "q 2-3 weeks; daily last 2 wks" (12/17/2016)  ? Hyperlipidemia   ? takes Ramipril daily  ? Hypertension   ? takes Ramipril daily  ? Joint pain   ? Joint swelling   ? 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 Nocturia   ? Numbness   ? both arms   ? Pneumonia   ? hx of-2014  ? PONV (postoperative nausea and vomiting)   ? Pt reports nausea only.  ? Sleep apnea   ? dx just this past week   08/21/2017  ? Stroke [facility]) 2015  ? "drag left foot more since; have to wear glasses now" (12/17/2016)  ? Type II diabetes mellitus ([place])   ? takes Metformin daily   dx 2015  ? Umbilical hernia   ? Urinary frequency   ?  Wears dentures   ? Wears glasses   ? and contact lenses  ? ?Past Surgical History:  ?Procedure Laterality Date  ? ANEURYSM COILING  2015  ? CARDIOVERSION N/A 12/18/2016  ? Procedure: CARDIOVERSION;  Surgeon: Dixie Dials, MD;  Location: [facility] ENDOSCOPY;  Service: Cardiovascular;  Laterality: N/A;  ? COLONOSCOPY    ? COLONOSCOPY N/A 04/08/2018  ? Procedure: COLONOSCOPY;  Surgeon: Carol Ada, MD;  Location: [facility] ENDOSCOPY;  Service: Endoscopy;  Laterality: N/A;  ? COLONOSCOPY N/A 10/18/2020  ? Procedure: COLONOSCOPY;  Surgeon: Carol Ada, MD;  Location: [facility]OS[facility];  Service: Endoscopy;  Laterality: N/A;  ? ESOPHAGOGASTRODUODENOSCOPY N/A 04/08/2018  ? Procedure: ESOPHAGOGASTRODUODENOSCOPY (EGD);  Surgeon: Carol Ada, MD;  Location: [facility];  Service: Endoscopy;  Laterality: N/A;  ? EXCISIONAL  HEMORRHOIDECTOMY  1980s  ? "soon after rectal OR"  ? GANGLION CYST EXCISION Right   ? HEMOSTASIS CLIP PLACEMENT  10/18/2020  ? Procedure: HEMOSTASIS CLIP PLACEMENT;  Surgeon: Carol Ada, MD;  Location: [facility];  Service: Endoscopy;;  ? HOT HEMOSTASIS N/A 04/08/2018  ? Procedure: HOT HEMOSTASIS (ARGON PLASMA COAGULATION/BICAP);  Surgeon: Carol Ada, MD;  Location: [facility];  Service: Endoscopy;  Laterality: N/A;  ? HOT HEMOSTASIS N/A 10/18/2020  ? Procedure: HOT HEMOSTASIS (ARGON PLASMA COAGULATION/BICAP);  Surgeon: Carol Ada, MD;  Location: [facility];  Service: Endoscopy;  Laterality: N/A;  ? IR 3D INDEPENDENT WKST  05/10/2017  ? IR 3D INDEPENDENT WKST  06/28/2017  ? IR ANGIO INTRA EXTRACRAN SEL COM CAROTID INNOMINATE BILAT MOD SED  04/22/2017  ? IR ANGIO INTRA EXTRACRAN SEL COM CAROTID INNOMINATE BILAT MOD SED  11/22/2017  ? IR ANGIO INTRA EXTRACRAN SEL COM CAROTID INNOMINATE BILAT MOD SED  05/13/2018  ? IR ANGIO INTRA EXTRACRAN SEL INTERNAL CAROTID UNI L MOD SED  12/27/2017  ? IR ANGIO INTRA EXTRACRAN SEL INTERNAL CAROTID UNI R MOD SED  05/10/2017  ? IR ANGIO INTRA EXTRACRAN SEL INTERNAL CAROTID UNI R MOD SED  06/28/2017  ? IR ANGIO VERTEBRAL SEL SUBCLAVIAN INNOMINATE UNI L MOD SED  04/22/2017  ? IR ANGIO VERTEBRAL SEL SUBCLAVIAN INNOMINATE UNI R MOD SED  06/28/2017  ? IR ANGIO VERTEBRAL SEL SUBCLAVIAN INNOMINATE UNI R MOD SED  11/22/2017  ? IR ANGIO VERTEBRAL SEL SUBCLAVIAN INNOMINATE UNI R MOD SED  05/13/2018  ? IR ANGIO VERTEBRAL SEL VERTEBRAL UNI R MOD SED  04/22/2017  ? IR ANGIOGRAM FOLLOW UP STUDY  05/10/2017  ? IR ANGIOGRAM FOLLOW UP STUDY  06/28/2017  ? IR ANGIOGRAM FOLLOW UP STUDY  12/27/2017  ? IR NEURO EACH ADD'L AFTER BASIC UNI LEFT (MS)  12/27/2017  ? IR NEURO EACH ADD'L AFTER BASIC UNI RIGHT (MS)  05/10/2017  ? IR NEURO EACH ADD'L AFTER BASIC UNI RIGHT (MS)  06/28/2017  ? IR RADIOLOGIST EVAL & MGMT  04/30/2017  ? IR RADIOLOGIST EVAL & MGMT  06/15/2017  ? IR RADIOLOGIST EVAL & MGMT  07/13/2017  ? IR  RADIOLOGIST EVAL & MGMT  01/11/2018  ? IR TRANSCATH/EMBOLIZ  05/10/2017  ? IR TRANSCATH/EMBOLIZ  06/28/2017  ? IR TRANSCATH/EMBOLIZ  12/27/2017  ? MULTIPLE TOOTH EXTRACTIONS    ? POLYPECTOMY  04/08/2018  ? Procedure: POLYPECTOMY;  Surgeon: Carol Ada, MD;  Location: [facility];  Service: Endoscopy;;  ? RADIOLOGY WITH ANESTHESIA N/A 08/08/2014  ? Procedure: RADIOLOGY WITH ANESTHESIA EMBOLIZATION;  Surgeon: Medication Radiologist, MD;  Location: [place];  Service: Radiology;  Laterality: N/A;  ? RADIOLOGY WITH ANESTHESIA N/A 11/21/2014  ? Procedure: RADIOLOGY WITH ANESTHESIA;  Surgeon: Rob Hickman, MD;  Location: [place];  Service: Radiology;  Laterality: N/A;  ? RADIOLOGY WITH ANESTHESIA N/A 02/27/2015  ? Procedure: RADIOLOGY WITH ANESTHESIA;  Surgeon: Luanne Bras, MD;  Location: [place];  Service: Radiology;  Laterality: N/A;  ? RADIOLOGY WITH ANESTHESIA N/A 09/25/2015  ? Procedure: RADIOLOGY WITH ANESTHESIA;  Surgeon: Luanne Bras, MD;  Location: [place];  Service: Radiology;  Laterality: N/A;  ? RADIOLOGY WITH ANESTHESIA N/A 05/10/2017  ? Procedure: EMBOLIZATION;  Surgeon: Luanne Bras, MD;  Location: [place];  Service: Radiology;  Laterality: N/A;  ? RADIOLOGY WITH ANESTHESIA N/A 06/28/2017  ? Procedure: RADIOLOGY WITH ANESTHESIA-EMBOLIZATION;  Surgeon: Luanne Bras, MD;  Location: [place];  Service: Radiology;  Laterality: N/A;  ? RADIOLOGY WITH ANESTHESIA N/A 08/26/2017  ? Procedure: RADIOLOGY WITH ANESTHESIA EMBOLIZATION;  Surgeon: Luanne Bras, MD;  Location: [place];  Service: Radiology;  Laterality: N/A;  ? RADIOLOGY WITH ANESTHESIA N/A 11/22/2017  ? Procedure: EMBOLIZATION;  Surgeon: Luanne Bras, MD;  Location: [place];  Service: Radiology;  Laterality: N/A;  ? RADIOLOGY WITH ANESTHESIA N/A 12/27/2017  ? Procedure: EMBOLIZATION;  Surgeon: Luanne Bras, MD;  Location: [place];  Service: Radiology;  Laterality: N/A;  ? RECTAL SURGERY  1980s  ? "tore it lifting heavy furniture; sewed  it back together"  ? SHOULDER ARTHROSCOPY WITH OPEN ROTATOR CUFF REPAIR Right 2016  ? SHOULDER ARTHROSCOPY WITH ROTATOR CUFF REPAIR Left 1996  ? SHOULDER OPEN ROTATOR CUFF REPAIR Left 1997  ? "took out 8inches of my coll

## 2022-01-02 NOTE — Assessment & Plan Note (Signed)
BMI 38 

## 2022-01-02 NOTE — Assessment & Plan Note (Addendum)
He has some mild wheezing ?- Continue Anoro ?- Start Pulmicort ?- Albuterol as needed ?

## 2022-01-02 NOTE — Assessment & Plan Note (Signed)
-   CPAP at night °

## 2022-01-02 NOTE — Assessment & Plan Note (Signed)
-   Continue Eliquis, atorvastatin, Plavix ?

## 2022-01-02 NOTE — ED Provider Notes (Signed)
?[facility] ?Provider Note ? ? ?CSN: [id number] ?Arrival date & time: 01/02/22  1417 ? ?  ? ?History ? ?Chief Complaint  ?Patient presents with  ? Shortness of Breath  ? ? ?[name] is a 61 y.o. male. ? ?HPI ?61 year old male presents with near syncope.  He was at work [employer] around 10 AM this morning and started to feel sweaty and clammy and like he was going to pass out.  Told his boss and kept working.  30 minutes later he was still feeling the same way and finally sat down.  He felt better.  He got back up to work but after doing work again he started to feel lightheaded again and got some blurry vision bilaterally.  It was diffuse across his vision and was definitely bilateral.  He had some vision difficulty with a prior stroke which made him concerned.  He then came here for evaluation.  No further near syncope but on the way here he had about 10 minutes of chest and back pain and a little bit of dyspnea.  Resolved around 1 PM.  He has been having cramping in his extremities ever since this occurrence.  He feels like he has been drinking adequately.  No recent illness otherwise.  Right now he feels fine besides a little bit of cramping. ? ?No palpitations today.  He has a history of atrial fibrillation and is compliant with his Eliquis.  He had a history of stroke, type 2 diabetes, and COPD. ? ?Home Medications ?Prior to Admission medications   ?Medication Sig Start Date End Date Taking? Authorizing Provider  ?albuterol (PROVENTIL HFA;VENTOLIN HFA) 108 (90 BASE) MCG/ACT inhaler Inhale 2 puffs into the lungs every 6 (six) hours as needed for wheezing or shortness of breath.    Yes [provider]  ?amiodarone (PACERONE) 200 MG tablet Take 100 mg by mouth daily.   Yes [provider]  ?apixaban (ELIQUIS) 5 MG TABS tablet Take 1 tablet (5 mg total) by mouth 2 (two) times daily. 10/19/20  Yes Dwyane Dee, MD  ?atorvastatin (LIPITOR) 40 MG tablet  Take 40 mg by mouth daily. 12/25/21  Yes [provider]  ?clopidogrel (PLAVIX) 75 MG tablet Take 0.5 tablets (37.5 mg total) by mouth daily. 10/19/20  Yes Dwyane Dee, MD  ?furosemide (LASIX) 40 MG tablet Take 40 mg by mouth 2 (two) times daily.   Yes [provider]  ?KLOR-CON M10 10 MEQ tablet Take 10 mEq by mouth daily. 08/22/20  Yes [provider]  ?metFORMIN (GLUCOPHAGE) 500 MG tablet Take 1 tablet (500 mg total) by mouth 2 (two) times daily with a meal. 07/06/14  Yes Debbe Odea, MD  ?nitroGLYCERIN (NITROSTAT) 0.4 MG SL tablet Place 1 tablet (0.4 mg total) under the tongue every 5 (five) minutes x 3 doses as needed for chest pain. 07/08/14  Yes Dixie Dials, MD  ?nortriptyline (PAMELOR) 10 MG capsule Take 10 mg by mouth at bedtime. 12/31/21  Yes [provider]  ?pantoprazole (PROTONIX) 40 MG tablet Take 1 tablet (40 mg total) by mouth daily as needed (for heartburn). 10/19/20  Yes Dwyane Dee, MD  ?ramipril (ALTACE) 5 MG capsule Take 5 mg by mouth daily. 08/22/20  Yes [provider]  ?umeclidinium-vilanterol (ANORO ELLIPTA) 62.5-25 MCG/INH AEPB Inhale 2 puffs into the lungs daily.   Yes [provider]  ?valACYclovir (VALTREX) 1000 MG tablet Take 1,000 mg by mouth 3 (three) times daily. 10/15/21  Yes [provider]  ?traMADol (ULTRAM) 50 MG tablet Take 1-2 tablets (50-100 mg total) by mouth every 6 (six) hours as needed for moderate pain or severe pain. ?Patient not taking: Reported on 01/02/2022 09/24/20   Michael Boston, MD  ?   ? ?Allergies    ?Tylox [oxycodone-acetaminophen], Gabapentin, and Adhesive [tape]   ? ?Review of Systems   ?Review of Systems  ?Constitutional:  Negative for fever.  ?Eyes:  Positive for visual disturbance.  ?Respiratory:  Positive for shortness of breath. Negative for cough.   ?Cardiovascular:  Positive for chest pain.  ?Gastrointestinal:  Negative for abdominal pain.  ?Neurological:  Positive for light-headedness.  Negative for syncope, weakness, numbness and headaches.  ? ?Physical Exam ?Updated Vital Signs ?BP (!) 121/56 (BP Location: Right Arm)   Pulse 68   Temp 98.5 ?F (36.9 ?C) (Oral)   Resp 18   Ht 5\' 8"  (1.727 m)   Wt 111.6 kg   SpO2 98%   BMI 37.40 kg/m?  ?Physical Exam ?Vitals and nursing note reviewed.  ?Constitutional:   ?   Appearance: He is well-developed.  ?HENT:  ?   Head: Normocephalic and atraumatic.  ?Eyes:  ?   Extraocular Movements: Extraocular movements intact.  ?   Pupils: Pupils are equal, round, and reactive to light.  ?Cardiovascular:  ?   Rate and Rhythm: Normal rate and regular rhythm.  ?   Heart sounds: Normal heart sounds.  ?Pulmonary:  ?   Effort: Pulmonary effort is normal.  ?   Breath sounds: Wheezing (mild expiratory wheezing) present.  ?Abdominal:  ?   Palpations: Abdomen is soft.  ?   Tenderness: There is no abdominal tenderness.  ?Musculoskeletal:  ?   Cervical back: Neck supple.  ?   Right lower leg: No edema.  ?   Left lower leg: No edema.  ?Skin: ?   General: Skin is warm and dry.  ?Neurological:  ?   Mental Status: He is alert.  ?   Comments: CN 3-12 grossly intact. 5/5 strength in all 4 extremities. Grossly normal sensation. Normal finger to nose.   ? ? ?ED Results / Procedures / Treatments   ?Labs ?(all labs ordered are listed, but only abnormal results are displayed) ?Labs Reviewed  ?COMPREHENSIVE METABOLIC PANEL - Abnormal; Notable for the following components:  ?    Result Value  ? Sodium 134 (*)   ? Glucose, Bld 131 (*)   ? Creatinine, Ser 2.09 (*)   ? GFR, Estimated 36 (*)   ? All other components within normal limits  ?CBC WITH DIFFERENTIAL/PLATELET - Abnormal; Notable for the following components:  ? WBC 10.7 (*)   ? Neutro Abs 7.9 (*)   ? All other components within normal limits  ?URINALYSIS, ROUTINE W REFLEX MICROSCOPIC - Abnormal; Notable for the following components:  ? APPearance CLOUDY (*)   ? Protein, ur 100 (*)   ? Bacteria, UA RARE (*)   ? All other components  within normal limits  ?GLUCOSE, CAPILLARY - Abnormal; Notable for the following components:  ? Glucose-Capillary 106 (*)   ? All other components within normal limits  ?CBG MONITORING, ED - Abnormal; Notable for the following components:  ? Glucose-Capillary 127 (*)   ? All other components within normal limits  ?GASTROINTESTINAL PANEL BY PCR, STOOL (REPLACES STOOL CULTURE)  ?MAGNESIUM  ?CK  ?URINALYSIS, ROUTINE W REFLEX MICROSCOPIC  ?SODIUM, URINE, RANDOM  ?CREATININE, URINE, RANDOM  ?PROTEIN / CREATININE RATIO, URINE  ?UREA NITROGEN,  URINE  ?HIV ANTIBODY (ROUTINE TESTING W REFLEX)  ?BASIC METABOLIC PANEL  ?TROPONIN I (HIGH SENSITIVITY)  ?TROPONIN I (HIGH SENSITIVITY)  ? ? ?EKG ?EKG Interpretation ? ?Date/Time:  Friday January 02 2022 14:27:00 EDT ?Ventricular Rate:  81 ?PR Interval:  136 ?QRS Duration: 136 ?QT Interval:  406 ?QTC Calculation: 471 ?R Axis:   -36 ?Text Interpretation: Sinus rhythm with occasional Premature ventricular complexes Left axis deviation Right bundle branch block Minimal voltage criteria for LVH, may be normal variant ( R in aVL ) Abnormal ECG When compared with ECG of 18-Oct-2020 03:47, PREVIOUS ECG IS PRESENT RBBB old Confirmed by Lavenia Atlas [id number]) on 01/02/2022 2:48:13 PM ? ?Radiology ?US RENAL ? ?Result Date: 01/02/2022 ?CLINICAL DATA:  Acute renal insufficiency, horseshoe kidney EXAM: RENAL / URINARY TRACT ULTRASOUND COMPLETE COMPARISON:  05/14/2014 FINDINGS: Right Kidney: Renal measurements: 11.5 x 6.8 by 6.2 cm = volume: 252 mL. Horseshoe configuration of the kidney. Normal renal cortical echotexture. No hydronephrosis or nephrolithiasis. Left Kidney: Renal measurements: 10.7 x 5.6 x 4.9 cm = volume: 153 mL. Horseshoe configuration of the kidney. Normal renal cortical echotexture. No hydronephrosis or nephrolithiasis. Bladder: Appears normal for degree of bladder distention. Other: The isthmus of the horseshoe kidney is well visualized and unremarkable. IMPRESSION: 1. Horseshoe  configuration of the kidney. Otherwise unremarkable exam. Electronically Signed   By: Randa Ngo M.D.   On: 01/02/2022 23:14  ? ?DG Chest Port 1 View ? ?Result Date: 01/02/2022 ?CLINICAL DATA:  Dizziness, blurred v

## 2022-01-02 NOTE — Hospital Course (Signed)
[name] is a 61 y.o. 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 ? ?Patient has had diarrhea for the last few days, watery yellow.  Then today at work, he felt clammy, sweaty, dizzy, weak, nearly passed out, had to sit down, then when he got up he felt it again so he came to the ER. ? 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 ? ?He was given fluids and the hospitalist service were asked to evaluate for acute kidney injury. ?

## 2022-01-02 NOTE — Assessment & Plan Note (Signed)
-   Check C. difficile, GI panel ?

## 2022-01-02 NOTE — Assessment & Plan Note (Signed)
Blood pressure soft here ?- Hold ramipril, furosemide ?

## 2022-01-02 NOTE — Assessment & Plan Note (Addendum)
In the setting of diarrhea, Lasix and ramipril use, presyncope, and relatively soft blood pressure here this is very likely prerenal.  Denies NSAID use. ?- Obtain renal ultrasound ?- Check urine electrolytes (note on furosemide) ?-Check urine protein creatinine, urinalysis ?-IV fluids ?- Repeat BMP in the morning ?-Hold ramipril, furosemide ? ? ?

## 2022-01-02 NOTE — Assessment & Plan Note (Signed)
Glucose here normal ?- Hold home metformin ?- Sliding scale corrections ?

## 2022-01-03 DIAGNOSIS — N179 Acute kidney failure, unspecified: Secondary | ICD-10-CM | POA: Diagnosis not present

## 2022-01-03 LAB — BASIC METABOLIC PANEL
Anion gap: 5 (ref 5–15)
BUN: 17 mg/dL (ref 6–20)
CO2: 30 mmol/L (ref 22–32)
Calcium: 9.5 mg/dL (ref 8.9–10.3)
Chloride: 102 mmol/L (ref 98–111)
Creatinine, Ser: 1.31 mg/dL — ABNORMAL HIGH (ref 0.61–1.24)
GFR, Estimated: >60 mL/min (ref >60)
Glucose, Bld: 115 mg/dL — ABNORMAL HIGH (ref 70–99)
Potassium: 3.5 mmol/L (ref 3.5–5.1)
Sodium: 137 mmol/L (ref 135–145)

## 2022-01-03 LAB — GASTROINTESTINAL PANEL BY PCR, STOOL (REPLACES STOOL CULTURE)

## 2022-01-03 LAB — HIV ANTIBODY (ROUTINE TESTING W REFLEX): HIV Screen 4th Generation wRfx: NONREACTIVE

## 2022-01-03 LAB — GLUCOSE, CAPILLARY
Glucose-Capillary: 107 mg/dL — ABNORMAL HIGH (ref 70–99)
Glucose-Capillary: 128 mg/dL — ABNORMAL HIGH (ref 70–99)

## 2022-01-03 NOTE — Discharge Summary (Signed)
?Physician Discharge Summary ?  ?Patient: [name] MRN: [id number] DOB: May 27, 1961  ?Admit date:     01/02/2022  ?Discharge date: 01/03/22  ?Discharge Physician: Edwin Dada  ? ?PCP: Bernerd Limbo, MD  ? ?Recommendations at discharge:  ?Follow up with PCP Dr. Coletta Memos in 1 week ?Dr. Coletta Memos: Please check BMP/SCr this week and restart ramipril and furosemide when appropriate ? ? ? ? ?Discharge Diagnoses: ?Principal Problem: ?  AKI (acute kidney injury) ([place]) ?Active Problems: ?  COPD (chronic obstructive pulmonary disease) ([place]) ?  DM type 2 causing vascular disease ([place]) ?  PAF (paroxysmal atrial fibrillation) ([place]) ?  Class 2 obesity with alveolar hypoventilation, serious comorbidity, and body mass index (BMI) of 38.0 to 38.9 in adult [facility]) ?  OSA and COPD overlap syndrome ([place]) ?  History of CVA (cerebrovascular accident) ?  Essential hypertension ?  Rotavirus diarrhea ?  ? ? ? ? ? ?Hospital Course: ?Mr. [name] is a 61 y.o. 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 ? ?Patient has had diarrhea for the last few days, watery yellow.  Then today at work, he felt clammy, sweaty, dizzy, weak, nearly passed out, had to sit down, then when he got up he felt it again so he came to the ER. ? 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 ? ?He was given fluids and the hospitalist service were asked to evaluate for acute kidney injury. ? ? ? ? ?Assessment and Plan: ?* AKI (acute kidney injury) ([place]) ?In the setting of diarrhea, Lasix and ramipril use, presyncope, and relatively soft blood pressure.  Denies NSAID use. ? ?Renal US normal.  Urine electrolytes suggested  pre-renal injury.  Repeat BMP was improved.  In the morning, he was taking PO well, ambualted without symptoms, felt good.   ? ?Discharged to home with close PCP follow up, holding ACE and diuretic.  ? ? ? ? ?Rotavirus diarrhea ?GI panel obtained, positive for rotavirus DNA. Recommended supportive care, fluids, loperamide. ? ? ? ?Essential hypertension ?Ramipril, furosemide held at d/c. ? ? ? ? ?  ? ?Pain control - [employer] Controlled Substance Reporting System database was reviewed.  ? ?Disposition: Home ?Diet recommendation:  ?Discharge Diet Orders (From admission, onward)  ? ?  Start     Ordered  ? 01/03/22 0000  Diet - low sodium heart healthy       ? 01/03/22 1126  ? ?  ?  ? ?  ? ? ?DISCHARGE MEDICATION: ?Allergies as of 01/03/2022   ? ?   Reactions  ? Tylox [oxycodone-acetaminophen] Hives, Other (See Comments), Rash  ? Sweating, shaking ?Reaction: Tremors and diaphoresis   ? Gabapentin   ? Gained weight and retained fluid   ? Adhesive [tape] Rash  ? States IV tape is fine  ? ?  ? ?  ?Medication List  ?  ? ?STOP taking these medications   ? ?furosemide 40 MG tablet ?Commonly known as: LASIX ?  ?nitroGLYCERIN 0.4 MG SL tablet ?Commonly known as: NITROSTAT ?  ?ramipril 5 MG capsule ?Commonly known as: ALTACE ?  ?traMADol 50 MG tablet ?Commonly known as: ULTRAM ?  ? ?  ? ?  TAKE these medications   ? ?albuterol 108 (90 Base) MCG/ACT inhaler ?Commonly known as: VENTOLIN HFA ?Inhale 2 puffs into the lungs every 6 (six) hours as needed for wheezing or shortness of breath. ?  ?amiodarone 200 MG tablet ?Commonly known as: PACERONE ?Take 100 mg by mouth daily. ?  ?Anoro Ellipta 62.5-25 MCG/ACT Aepb ?Generic drug: umeclidinium-vilanterol ?Inhale 2 puffs into the lungs daily. ?  ?apixaban 5 MG Tabs tablet ?Commonly known as: ELIQUIS ?Take 1 tablet (5 mg total) by mouth 2 (two) times daily. ?  ?atorvastatin 40 MG tablet ?Commonly known as: LIPITOR ?Take 40 mg by mouth daily. ?  ?clopidogrel 75 MG tablet ?Commonly known  as: PLAVIX ?Take 0.5 tablets (37.5 mg total) by mouth daily. ?  ?Klor-Con M10 10 MEQ tablet ?Generic drug: potassium chloride ?Take 10 mEq by mouth daily. ?  ?metFORMIN 500 MG tablet ?Commonly known as: Glucophage ?Take 1 tablet (500 mg total) by mouth 2 (two) times daily with a meal. ?  ?nortriptyline 10 MG capsule ?Commonly known as: PAMELOR ?Take 10 mg by mouth at bedtime. ?  ?pantoprazole 40 MG tablet ?Commonly known as: PROTONIX ?Take 1 tablet (40 mg total) by mouth daily as needed (for heartburn). ?  ?valACYclovir 1000 MG tablet ?Commonly known as: VALTREX ?Take 1,000 mg by mouth 3 (three) times daily. ?  ? ?  ? ? Follow-up Information   ? ? Bernerd Limbo, MD. Schedule an appointment as soon as possible for a visit in 1 week(s).   ?Specialty: Family Medicine ?Contact information: ?[address] ?[address] ?[facility] ?[place] [address] ?[phone] ? ? ?  ?  ? ?  ?  ? ?  ? ?Discharge Instructions   ? ? Diet - low sodium heart healthy   Complete by: As directed ?  ? Discharge instructions   Complete by: As directed ?  ? From Dr. Loleta Books: ?You were admitted overnight for a mild kidney injury (mild kidney failure) ?This happened because you were a little dehydrated and when you get sick or dehydrated, your medicines ramipril and Lasix can hurt the kidneys. ? ?Normally, ramipril is very good for the kidneys, but when you get sick and don't feel well (like if you are throwing up a lot or having a lot of diarrhea), you should hold it for a day or two. ? ? ?For the next week: ?Do NOT take your ramipril or furosemide/Lasix ?Call Dr. Chales Abrahams office on Monday and schedule an appointment this week (for weds or thurs). ?At very least, get a lab visit for this week to check your kiddney function ?Ask him if you should restart the ramipril. ? ? ?Push fluids ?Resume your other home medicines ?Y  ? Increase activity slowly   Complete by: As directed ?  ? ?  ? ? ?Discharge Exam: ?Filed Weights  ? 01/02/22  1427  ?Weight: 111.6 kg  ? ?General: Pt is alert, awake, not in acute distress ?Cardiovascular: RRR, nl S1-S2, no murmurs appreciated.   No LE edema.   ?Respiratory: Normal respiratory rate and rhythm.  CTAB without rales or wheezes. ?Abdominal: Abdomen soft and non-tender.  No distension or HSM.   ?Neuro/Psych: Strength symmetric in upper and lower extremities.  Judgment and insight appear normal. ? ? ?Condition at discharge: good ? ?The results of significant diagnostics from this hospitalization (including imaging, microbiology, ancillary and laboratory) are listed below for reference.  ? ?Imaging Studies: ?CT Lumbar Spine Wo Contrast ? ?Result Date: 12/28/2021 ?  CLINICAL DATA:  Low back pain EXAM: CT LUMBAR SPINE WITHOUT CONTRAST TECHNIQUE: Multidetector CT imaging of the lumbar spine was performed without intravenous contrast administration. Multiplanar CT image reconstructions were also generated. RADIATION DOSE REDUCTION: This exam was performed according to the departmental dose-optimization program which includes automated exposure control, adjustment of the mA and/or kV according to patient size and/or use of iterative reconstruction technique. COMPARISON:  None. FINDINGS: Segmentation: 5 lumbar type vertebrae. Alignment: Normal. Vertebrae: No acute fracture or focal pathologic process. 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 No evidence of discitis-osteomyelitis. Paraspinal and other soft tissues: Calcific aortic atherosclerosis. Disc levels: There is no spinal canal stenosis. There is moderate left foraminal stenosis at L3-4 due to a left asymmetric disc bulge. IMPRESSION: 1. No discitis-osteomyelitis or other acute abnormality of the lumbar spine 2. 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 3. Moderate left L3-4 foraminal stenosis. Aortic Atherosclerosis (ICD10-I70.0). Electronically Signed   By: Ulyses Jarred M.D.   On: 12/28/2021 19:03  ? ?US RENAL ? ?Result Date:  01/02/2022 ?CLINICAL DATA:  Acute renal insufficiency, horseshoe kidney EXAM: RENAL / URINARY TRACT ULTRASOUND COMPLETE COMPARISON:  05/14/2014 FINDINGS: Right Kidney: Renal measurements: 11.5 x 6.8 by 6.2 cm

## 2022-01-03 NOTE — Progress Notes (Signed)
Patient discharged to home with instructions. 

## 2022-01-04 LAB — UREA NITROGEN, URINE: Urea Nitrogen, Ur: 501 mg/dL

## 2022-02-21 ENCOUNTER — Encounter (HOSPITAL_COMMUNITY): Payer: Self-pay | Admitting: Emergency Medicine

## 2022-02-21 ENCOUNTER — Emergency Department (HOSPITAL_COMMUNITY): Payer: Medicare HMO

## 2022-02-21 ENCOUNTER — Other Ambulatory Visit: Payer: Self-pay

## 2022-02-21 ENCOUNTER — Emergency Department (HOSPITAL_COMMUNITY)
Admission: EM | Admit: 2022-02-21 | Discharge: 2022-02-21 | Disposition: A | Discharge status: Home / Self Care | Payer: Medicare HMO | Attending: Emergency Medicine | Admitting: Emergency Medicine

## 2022-02-21 DIAGNOSIS — Z7984 Long term (current) use of oral hypoglycemic drugs: Secondary | ICD-10-CM | POA: Insufficient documentation

## 2022-02-21 DIAGNOSIS — Z7951 Long term (current) use of inhaled steroids: Secondary | ICD-10-CM | POA: Insufficient documentation

## 2022-02-21 DIAGNOSIS — E1122 Type 2 diabetes mellitus with diabetic chronic kidney disease: Secondary | ICD-10-CM | POA: Insufficient documentation

## 2022-02-21 DIAGNOSIS — Z7902 Long term (current) use of antithrombotics/antiplatelets: Secondary | ICD-10-CM | POA: Diagnosis not present

## 2022-02-21 DIAGNOSIS — I129 Hypertensive chronic kidney disease with stage 1 through stage 4 chronic kidney disease, or unspecified chronic kidney disease: Secondary | ICD-10-CM | POA: Insufficient documentation

## 2022-02-21 DIAGNOSIS — J449 Chronic obstructive pulmonary disease, unspecified: Secondary | ICD-10-CM | POA: Insufficient documentation

## 2022-02-21 DIAGNOSIS — Z7901 Long term (current) use of anticoagulants: Secondary | ICD-10-CM | POA: Insufficient documentation

## 2022-02-21 DIAGNOSIS — M25531 Pain in right wrist: Secondary | ICD-10-CM | POA: Insufficient documentation

## 2022-02-21 DIAGNOSIS — N189 Chronic kidney disease, unspecified: Secondary | ICD-10-CM | POA: Diagnosis not present

## 2022-02-21 NOTE — ED Provider Notes (Signed)
?[facility]-EMERGENCY DEPT ?Provider Note ? ? ?CSN: [id number] ?Arrival date & time: 02/21/22  1327 ? ?  ? ?History ?Pmh: CAD, Atrial fibrillation on blood thinners, HLD, CKD, hx stroke, HTN, DM II, GERD, and COPD ? ?No chief complaint on file. ? ? ?[name] is a 61 y.o. male. ?Patient presents the ED with a chief complaint of right wrist pain.  Pain is mostly on the lateral aspect of the right wrist.  It has been ongoing for 3 months and is gradually worsened.  Gripping, supination, and pronation of the wrist make it worse.  He does experience shooting pains going up his elbow at times. He has occasional tingling sensations in his wrist. He has not tried anything to make the pain better or worse.  He has not been evaluated before for this issue.   ? ?HPI ? ?  ? ?Home Medications ?Prior to Admission medications   ?Medication Sig Start Date End Date Taking? Authorizing Provider  ?albuterol (PROVENTIL HFA;VENTOLIN HFA) 108 (90 BASE) MCG/ACT inhaler Inhale 2 puffs into the lungs every 6 (six) hours as needed for wheezing or shortness of breath.     [provider]  ?amiodarone (PACERONE) 200 MG tablet Take 100 mg by mouth daily.    [provider]  ?apixaban (ELIQUIS) 5 MG TABS tablet Take 1 tablet (5 mg total) by mouth 2 (two) times daily. 10/19/20   Lewie Chamber, MD  ?atorvastatin (LIPITOR) 40 MG tablet Take 40 mg by mouth daily. 12/25/21   [provider]  ?clopidogrel (PLAVIX) 75 MG tablet Take 0.5 tablets (37.5 mg total) by mouth daily. 10/19/20   Lewie Chamber, MD  ?KLOR-CON M10 10 MEQ tablet Take 10 mEq by mouth daily. 08/22/20   [provider]  ?metFORMIN (GLUCOPHAGE) 500 MG tablet Take 1 tablet (500 mg total) by mouth 2 (two) times daily with a meal. 07/06/14   Calvert Cantor, MD  ?nortriptyline (PAMELOR) 10 MG capsule Take 10 mg by mouth at bedtime. 12/31/21   [provider]  ?pantoprazole (PROTONIX) 40 MG tablet Take 1 tablet (40 mg total) by  mouth daily as needed (for heartburn). 10/19/20   Lewie Chamber, MD  ?umeclidinium-vilanterol [facility] ELLIPTA) 62.5-25 MCG/INH AEPB Inhale 2 puffs into the lungs daily.    [provider]  ?valACYclovir (VALTREX) 1000 MG tablet Take 1,000 mg by mouth 3 (three) times daily. 10/15/21   [provider]  ?   ? ?Allergies    ?Tylox [oxycodone-acetaminophen], Gabapentin, and Adhesive [tape]   ? ?Review of Systems   ?Review of Systems  ?Musculoskeletal:  Positive for arthralgias.  ?All other systems reviewed and are negative. ? ?Physical Exam ?Updated Vital Signs ?BP (!) 143/92   Pulse (!) 55   Temp 97.7 ?F (36.5 ?C) (Oral)   Resp 18   Ht 5\' 8"  (1.727 m)   Wt 111 kg   SpO2 96%   BMI 37.21 kg/m?  ?Physical Exam ?Vitals and nursing note reviewed.  ?Constitutional:   ?   General: He is not in acute distress. ?   Appearance: Normal appearance. He is well-developed. He is not ill-appearing, toxic-appearing or diaphoretic.  ?HENT:  ?   Head: Normocephalic and atraumatic.  ?   Nose: No nasal deformity.  ?   Mouth/Throat:  ?   Lips: Pink. No lesions.  ?Eyes:  ?   General: Gaze aligned appropriately. No scleral icterus.    ?   Right eye: No discharge.     ?  Left eye: No discharge.  ?   Conjunctiva/sclera: Conjunctivae normal.  ?   Right eye: Right conjunctiva is not injected. No exudate or hemorrhage. ?   Left eye: Left conjunctiva is not injected. No exudate or hemorrhage. ?Pulmonary:  ?   Effort: Pulmonary effort is normal. No respiratory distress.  ?Musculoskeletal:  ?   Comments: No swelling or deformity of right wrist.  Patient has 5 out of 5 grip strength.  Able to range wrist in all directions.  Able to pronate and supinate with pain.  Tenderness to touch at the lateral aspect of the proximal ulna.  Positive Tinel's sign.  Radial pulse 2+.  Sensation intact.  ?Skin: ?   General: Skin is warm and dry.  ?Neurological:  ?   Mental Status: He is alert and oriented to person, place, and time.  ?Psychiatric:      ?   Mood and Affect: Mood normal.     ?   Speech: Speech normal.     ?   Behavior: Behavior normal. Behavior is cooperative.  ? ? ?ED Results / Procedures / Treatments   ?Labs ?(all labs ordered are listed, but only abnormal results are displayed) ?Labs Reviewed - No data to display ? ?EKG ?None ? ?Radiology ?DG Wrist Complete Right ? ?Result Date: 02/21/2022 ?CLINICAL DATA:  Patient complained of right posterior wrist pain for the past three months. Pt stated it has progressed now to where he cannot use his right wrist/hand/arm. Pt thinks he hurt it while bowling. EXAM: RIGHT WRIST - COMPLETE 3+ VIEW COMPARISON:  10/19/2021 FINDINGS: There is no evidence of fracture or dislocation. There is no evidence of arthropathy or other focal bone abnormality. Soft tissues are unremarkable. IMPRESSION: Negative. Electronically Signed   By: Amie Portland M.D.   On: 02/21/2022 14:47   ? ?Procedures ?Procedures  ? ? ?Medications Ordered in ED ?Medications - No data to display ? ?ED Course/ Medical Decision Making/ A&P ?  ?                        ?Medical Decision Making ?Amount and/or Complexity of Data Reviewed ?Radiology: ordered. ? ? ?Patient here with 3 months of right wrist pain that has been worsening.  His x-ray was negative for any acute fracture.  I doubt this is septic joint.  He had a positive Tinel sign and it seems consistent with cubital tunnel syndrome.  I will have him follow-up with orthopedics.  He has been degrees per orthopedics in the past and will like to follow-up with them.  I have also recommended supportive treatment options.  This is all outlined in his discharge paperwork.  ? ?Final Clinical Impression(s) / ED Diagnoses ?Final diagnoses:  ?Right wrist pain  ? ? ?Rx / DC Orders ?ED Discharge Orders   ? ? None  ? ?  ? ? ?  ?Claudie Leach, PA-C ?02/21/22 1543 ? ?  ?Lorre Nick, MD ?02/21/22 1639 ? ?

## 2022-02-21 NOTE — Discharge Instructions (Addendum)
Please schedule an appt with [facility] for concern of carpal or cubital tunnel syndrome. In the meanwhile, I recommend heating pad or ice packs or rest. Recommend using wrist brace. Avoid repetitive wrist motions.  ?

## 2022-02-21 NOTE — ED Triage Notes (Signed)
Patient reports R wrist pain for the past three months, states it has progressed now to where he cannot use his R wrist/hand/arm. Thinks he hurt it while bowling. Reports numbness, tingling. Denies fevers.  ?

## 2022-03-13 ENCOUNTER — Institutional Professional Consult (permissible substitution): Payer: Medicare HMO | Admitting: Pulmonary Disease

## 2022-03-23 ENCOUNTER — Other Ambulatory Visit (HOSPITAL_COMMUNITY): Payer: Self-pay | Admitting: Interventional Radiology

## 2022-03-23 DIAGNOSIS — I671 Cerebral aneurysm, nonruptured: Secondary | ICD-10-CM

## 2022-03-24 ENCOUNTER — Institutional Professional Consult (permissible substitution): Payer: Medicare HMO | Admitting: Pulmonary Disease

## 2022-03-31 ENCOUNTER — Encounter: Payer: Self-pay | Admitting: Internal Medicine

## 2022-03-31 ENCOUNTER — Ambulatory Visit (HOSPITAL_COMMUNITY): Payer: Medicare HMO

## 2022-03-31 ENCOUNTER — Ambulatory Visit
Admission: RE | Admit: 2022-03-31 | Discharge: 2022-03-31 | Disposition: A | Discharge status: Home / Self Care | Payer: Self-pay | Source: Ambulatory Visit | Attending: Internal Medicine | Admitting: Internal Medicine

## 2022-03-31 ENCOUNTER — Ambulatory Visit (INDEPENDENT_AMBULATORY_CARE_PROVIDER_SITE_OTHER): Payer: Medicare HMO | Admitting: Internal Medicine

## 2022-03-31 VITALS — BP 126/74 | HR 64 | Temp 97.7°F | Ht 68.0 in | Wt 256.6 lb

## 2022-03-31 DIAGNOSIS — J449 Chronic obstructive pulmonary disease, unspecified: Secondary | ICD-10-CM

## 2022-03-31 MED ORDER — BREZTRI AEROSPHERE 160-9-4.8 MCG/ACT IN AERO: 2.0000 | INHALATION_SPRAY | Freq: Two times a day (BID) | RESPIRATORY_TRACT | 5 refills | Status: DC

## 2022-03-31 MED ORDER — BREZTRI AEROSPHERE 160-9-4.8 MCG/ACT IN AERO: 2.0000 | INHALATION_SPRAY | Freq: Two times a day (BID) | RESPIRATORY_TRACT | 0 refills | Status: DC

## 2022-03-31 NOTE — Progress Notes (Signed)
[name]    OZ:[id number]    05-27-1961  Primary Care Physician:Bouska, Shanon Brow, MD  Referring Physician: Bernerd Limbo, MD [place],  [place] [id number] Reason for Consultation: shortness of breath Date of Consultation: 03/31/2022  Chief complaint:   Chief Complaint  Patient presents with   Consult    SOB/ cough/ wheezing      HPI: [name] is a 61 y.o. man who presents for new patient evaluation of shortness of breath, coughing and wheezing. Had childhood asthma and had a tracheostomy as a child.  Had a stroke in 2015 and has a left foot weakness. Quit smoking in 2015.   Gets pneumonia or bronchitis once every 3-4 years.   Now having worsening shortness of breath on exertion. He can walk 2-3 blocks before shortness of breath. He has cough with green mucus, also with wheezing and chest tightness and pain.   He takes anoro usually 2-3 times/month and takes albuterol inhaler once a day.  He doesn't anoro daily because his throat gets white and he gets skin inside his mouth.    Social history:  Occupation: works at [employer].  Smoking history: 60 pack years  Social History   Occupational History   Occupation: Retail buyer: [employer]    Comment: airport   Tobacco Use   Smoking status: Former    Packs/day: 1.50    Years: 40.00    Total pack years: 60.00    Types: Cigarettes    Start date: 01/15/1975    Quit date: 06/02/2014    Years since quitting: 7.8   Smokeless tobacco: Never  Vaping Use   Vaping Use: Former  Substance and Sexual Activity   Alcohol use: Yes    Alcohol/week: 3.0 standard drinks of alcohol    Types: 3 Cans of beer per week    Comment: drinks 3 - 12oz bottles every monday, during shooting pool   Drug use: No   Sexual activity: Never    Partners: Female    Comment: partner has had surgery to prevent pregnancy    Relevant family history:  Family History  Problem Relation Age of  Onset   Heart disease Mother        s/p 3V CABG   Cancer Father 1       lung cancer; +tobacco   Stroke Father    Cancer Maternal Grandmother    Heart disease Maternal Grandmother    Cancer Paternal Grandmother    Lupus Sister    Multiple sclerosis Sister    Anemia Daughter     Past Medical History:  Diagnosis Date   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   Arthritis    Atrial fibrillation ([place])    Bipolar disorder ([place])    was on meds but was taken off 2 yrs ago and none since   Burning pain    in both legs-seeing Dr.Sethi for this   CAD (coronary artery disease)    Cataracts, bilateral    Childhood asthma    Last asthma attack at age 70; History of trach at 16 months (12/17/2016)   Chronic kidney disease 2009   Chronic lower back pain    COPD (chronic obstructive pulmonary disease) ([place]) 06/2012   uses Spiriva and Albuterol daily as needed   Depression    Family history of adverse reaction to anesthesia    "Mom and sister have PONV"  GERD (gastroesophageal reflux disease)    H/O hiatal hernia    Headache(784.0)    "q 2-3 weeks; daily last 2 wks" (12/17/2016)   Hyperlipidemia    takes Ramipril daily   Hypertension    takes Ramipril daily   Joint pain    Joint swelling    Middle cerebral artery aneurysm    right   Nocturia    Numbness    both arms    Pneumonia    hx of-2014   PONV (postoperative nausea and vomiting)    Pt reports nausea only.   Sleep apnea    dx just this past week   08/21/2017   Stroke [facility]) 2015   "drag left foot more since; have to wear glasses now" (12/17/2016)   Type II diabetes mellitus (HCC)    takes Metformin daily   dx 2015   Umbilical hernia    Urinary frequency    Wears dentures    Wears glasses    and contact lenses    Past Surgical History:  Procedure Laterality Date   ANEURYSM COILING  2015   CARDIOVERSION N/A 12/18/2016   Procedure: CARDIOVERSION;  Surgeon: Orpah Cobb, MD;  [facility]: [facility];  Service: Cardiovascular;  Laterality:  N/A;   COLONOSCOPY     COLONOSCOPY N/A 04/08/2018   Procedure: COLONOSCOPY;  Surgeon: Jeani Hawking, MD;  Location: [facility];  Service: Endoscopy;  Laterality: N/A;   COLONOSCOPY N/A 10/18/2020   Procedure: COLONOSCOPY;  Surgeon: Jeani Hawking, MD;  [facility];  Service: Endoscopy;  Laterality: N/A;   ESOPHAGOGASTRODUODENOSCOPY N/A 04/08/2018   Procedure: ESOPHAGOGASTRODUODENOSCOPY (EGD);  Surgeon: Jeani Hawking, MD;  Location: [facility];  Service: Endoscopy;  Laterality: N/A;   EXCISIONAL HEMORRHOIDECTOMY  1980s   "soon after rectal OR"   GANGLION CYST EXCISION Right    HEMOSTASIS CLIP PLACEMENT  10/18/2020   Procedure: HEMOSTASIS CLIP PLACEMENT;  Surgeon: Jeani Hawking, MD;  [facility];  Service: Endoscopy;;   HOT HEMOSTASIS N/A 04/08/2018   Procedure: HOT HEMOSTASIS (ARGON PLASMA COAGULATION/BICAP);  Surgeon: Jeani Hawking, MD;  Location: [facility];  Service: Endoscopy;  Laterality: N/A;   HOT HEMOSTASIS N/A 10/18/2020   Procedure: HOT HEMOSTASIS (ARGON PLASMA COAGULATION/BICAP);  Surgeon: Jeani Hawking, MD;  Location: [facility];  Service: Endoscopy;  Laterality: N/A;   IR 3D INDEPENDENT WKST  05/10/2017   IR 3D INDEPENDENT WKST  06/28/2017   IR ANGIO INTRA EXTRACRAN SEL COM CAROTID INNOMINATE BILAT MOD SED  04/22/2017   IR ANGIO INTRA EXTRACRAN SEL COM CAROTID INNOMINATE BILAT MOD SED  11/22/2017   IR ANGIO INTRA EXTRACRAN SEL COM CAROTID INNOMINATE BILAT MOD SED  05/13/2018   IR ANGIO INTRA EXTRACRAN SEL INTERNAL CAROTID UNI L MOD SED  12/27/2017   IR ANGIO INTRA EXTRACRAN SEL INTERNAL CAROTID UNI R MOD SED  05/10/2017   IR ANGIO INTRA EXTRACRAN SEL INTERNAL CAROTID UNI R MOD SED  06/28/2017   IR ANGIO VERTEBRAL SEL SUBCLAVIAN INNOMINATE UNI L MOD SED  04/22/2017   IR ANGIO VERTEBRAL SEL SUBCLAVIAN INNOMINATE UNI R MOD SED  06/28/2017   IR ANGIO VERTEBRAL SEL SUBCLAVIAN INNOMINATE UNI R MOD SED  11/22/2017   IR ANGIO VERTEBRAL SEL SUBCLAVIAN INNOMINATE UNI R MOD SED   05/13/2018   IR ANGIO VERTEBRAL SEL VERTEBRAL UNI R MOD SED  04/22/2017   IR ANGIOGRAM FOLLOW UP STUDY  05/10/2017   IR ANGIOGRAM FOLLOW UP STUDY  06/28/2017   IR ANGIOGRAM FOLLOW UP STUDY  12/27/2017   IR  NEURO EACH ADD'L AFTER BASIC UNI LEFT (MS)  12/27/2017   IR NEURO EACH ADD'L AFTER BASIC UNI RIGHT (MS)  05/10/2017   IR NEURO EACH ADD'L AFTER BASIC UNI RIGHT (MS)  06/28/2017   IR RADIOLOGIST EVAL & MGMT  04/30/2017   IR RADIOLOGIST EVAL & MGMT  06/15/2017   IR RADIOLOGIST EVAL & MGMT  07/13/2017   IR RADIOLOGIST EVAL & MGMT  01/11/2018   IR TRANSCATH/EMBOLIZ  05/10/2017   IR TRANSCATH/EMBOLIZ  06/28/2017   IR TRANSCATH/EMBOLIZ  12/27/2017   MULTIPLE TOOTH EXTRACTIONS     POLYPECTOMY  04/08/2018   Procedure: POLYPECTOMY;  Surgeon: Carol Ada, MD;  [facility];  Service: Endoscopy;;   RADIOLOGY WITH ANESTHESIA N/A 08/08/2014   Procedure: RADIOLOGY WITH ANESTHESIA EMBOLIZATION;  Surgeon: Medication Radiologist, MD;  Location: [facility];  Service: Radiology;  Laterality: N/A;   RADIOLOGY WITH ANESTHESIA N/A 11/21/2014   Procedure: RADIOLOGY WITH ANESTHESIA;  Surgeon: Rob Hickman, MD;  Location: [facility];  Service: Radiology;  Laterality: N/A;   RADIOLOGY WITH ANESTHESIA N/A 02/27/2015   Procedure: RADIOLOGY WITH ANESTHESIA;  Surgeon: Luanne Bras, MD;  Location: [facility];  Service: Radiology;  Laterality: N/A;   RADIOLOGY WITH ANESTHESIA N/A 09/25/2015   Procedure: RADIOLOGY WITH ANESTHESIA;  Surgeon: Luanne Bras, MD;  Location: [facility];  Service: Radiology;  Laterality: N/A;   RADIOLOGY WITH ANESTHESIA N/A 05/10/2017   Procedure: EMBOLIZATION;  Surgeon: Luanne Bras, MD;  Location: [facility];  Service: Radiology;  Laterality: N/A;   RADIOLOGY WITH ANESTHESIA N/A 06/28/2017   Procedure: RADIOLOGY WITH ANESTHESIA-EMBOLIZATION;  Surgeon: Luanne Bras, MD;  Location: [facility];  Service: Radiology;  Laterality: N/A;   RADIOLOGY WITH ANESTHESIA N/A 08/26/2017   Procedure: RADIOLOGY WITH  ANESTHESIA EMBOLIZATION;  Surgeon: Luanne Bras, MD;  Location: [facility];  Service: Radiology;  Laterality: N/A;   RADIOLOGY WITH ANESTHESIA N/A 11/22/2017   Procedure: EMBOLIZATION;  Surgeon: Luanne Bras, MD;  Location: [facility];  Service: Radiology;  Laterality: N/A;   RADIOLOGY WITH ANESTHESIA N/A 12/27/2017   Procedure: EMBOLIZATION;  Surgeon: Luanne Bras, MD;  Location: [facility];  Service: Radiology;  Laterality: N/A;   RECTAL SURGERY  1980s   "tore it lifting heavy furniture; sewed it back together"   SHOULDER ARTHROSCOPY WITH OPEN ROTATOR CUFF REPAIR Right 2016   SHOULDER ARTHROSCOPY WITH ROTATOR CUFF REPAIR Left 1996   SHOULDER OPEN ROTATOR CUFF REPAIR Left 1997   "took out 8inches of my collarbone"   TEE WITHOUT CARDIOVERSION N/A 12/18/2016   Procedure: TRANSESOPHAGEAL ECHOCARDIOGRAM (TEE);  Surgeon: Dixie Dials, MD;  Location: [facility] ENDOSCOPY;  Service: Cardiovascular;  Laterality: N/A;   [place]   at age 5 months old; for asthma   TRACHEOSTOMY CLOSURE     VENTRAL HERNIA REPAIR N/A 09/24/2020   Procedure: LAPAROSCOPIC VENTRAL WALL HERNIA REPAIR WITH MESH;  Surgeon: Michael Boston, MD;  [facility];  Service: General;  Laterality: N/A;     Physical Exam: Blood pressure 126/74, pulse 64, temperature 97.7 F (36.5 C), temperature source Oral, height 5\' 8"  (1.727 m), weight 256 lb 9.6 oz (116.4 kg), SpO2 96 %. Gen:      No acute distress ENT:  no nasal polyps, mucus membranes moist Lungs:    No increased respiratory effort, symmetric chest wall excursion, clear to auscultation bilaterally, no wheezes or crackles CV:         Regular rate and rhythm; no murmurs, rubs, or gallops.  No pedal edema Abd:      +  bowel sounds; soft, non-tender; obese MSK: no acute synovitis of DIP or PIP joints, no mechanics hands.  Skin:      Warm and dry; no rashes Neuro: normal speech, no focal facial asymmetry Psych: alert and oriented x3, normal mood and affect   Data  Reviewed/Medical Decision Making:  Independent interpretation of tests: Imaging:  Review of patient's CT Chest lung cancer screening images Feb 11 2022 ([facility], brought in on a cd) revealed mild centrilobular emphysema 2-4 mm nodules. The patient's images have been independently reviewed by me.    PFTs: I have personally reviewed the patient's PFTs and      No data to display          Labs:  Lab Results  Component Value Date   WBC 10.7 (H) 01/02/2022   HGB 15.7 01/02/2022   HCT 47.0 01/02/2022   MCV 90.0 01/02/2022   PLT 335 01/02/2022   Lab Results  Component Value Date   NA 137 01/03/2022   K 3.5 01/03/2022   CL 102 01/03/2022   CO2 30 01/03/2022      Immunization status:  Immunization History  Administered Date(s) Administered   Influenza,inj,Quad PF,6+ Mos 07/05/2014, 06/29/2017   PFIZER(Purple Top)SARS-COV-2 Vaccination 11/27/2019, 12/19/2019, 08/16/2020   Pfizer Covid-19 Vaccine Bivalent Booster 68yrs & up 08/13/2021   Pneumococcal Polysaccharide-23 07/05/2014, 06/29/2017   Tdap 11/21/2010     I reviewed prior external note(s) from family [facility], neurology  I reviewed the result(s) of the labs and imaging as noted above.   I have ordered PFT  Assessment:  COPD, new diagnosis History of tobacco use Pulmonary nodules 2-4 mm  Plan/Recommendations:  Stop anoro given adverse effects and less effective. Switch to breztri 2 puffs twice a day Continue albuterol prn Will obtain pfts Based on fleischner's criteria will need another CT scan in May 2024. Will upload this year's scan into epic.   We discussed disease management and progression at length today.    Return to Care: Return in about 3 months (around 07/01/2022).  Durel Salts, MD Pulmonary and Critical Care Medicine  HealthCare Office:[phone]  CC: Tracey Harries, MD

## 2022-03-31 NOTE — Progress Notes (Signed)
The patient has been prescribed the inhaler breztri. Inhaler technique was demonstrated to patient. The patient subsequently demonstrated correct technique. ° °

## 2022-03-31 NOTE — Patient Instructions (Addendum)
Please schedule follow up scheduled with myself in 3 months.  If my schedule is not open yet, we will contact you with a reminder closer to that time. Please call [phone] if you haven't heard from [place] a month before.   Before your next visit I would like you to have: Full set of PFTs - next available  they will schedule at the front desk.   Stop taking anoro. Switch to breztri 2 puffs in the morning, 2 puffs at night. Gargle after use.   Continue albuterol as needed.

## 2022-04-01 ENCOUNTER — Institutional Professional Consult (permissible substitution): Payer: Medicare HMO | Admitting: Pulmonary Disease

## 2022-04-01 ENCOUNTER — Ambulatory Visit (HOSPITAL_COMMUNITY)
Admission: RE | Admit: 2022-04-01 | Discharge: 2022-04-01 | Disposition: A | Discharge status: Home / Self Care | Payer: Medicare HMO | Source: Ambulatory Visit | Attending: Interventional Radiology | Admitting: Interventional Radiology

## 2022-04-01 DIAGNOSIS — I671 Cerebral aneurysm, nonruptured: Secondary | ICD-10-CM | POA: Diagnosis not present

## 2022-04-01 DIAGNOSIS — E1159 Type 2 diabetes mellitus with other circulatory complications: Secondary | ICD-10-CM | POA: Insufficient documentation

## 2022-04-01 DIAGNOSIS — I63131 Cerebral infarction due to embolism of right carotid artery: Secondary | ICD-10-CM | POA: Insufficient documentation

## 2022-04-01 LAB — POCT I-STAT CREATININE: Creatinine, Ser: 0.9 mg/dL (ref 0.61–1.24)

## 2022-04-01 MED ORDER — IOHEXOL 350 MG/ML SOLN
100.0000 mL | Freq: Once | INTRAVENOUS | Status: AC | PRN
  Administered 2022-04-01: 75 mL via INTRAVENOUS

## 2022-04-13 ENCOUNTER — Telehealth (HOSPITAL_COMMUNITY): Payer: Self-pay

## 2022-04-13 NOTE — Telephone Encounter (Signed)
Pt agreed to f/u in 1 year with a cta head/neck. AW  

## 2022-05-06 ENCOUNTER — Other Ambulatory Visit (HOSPITAL_BASED_OUTPATIENT_CLINIC_OR_DEPARTMENT_OTHER): Payer: Self-pay | Admitting: Gastroenterology

## 2022-05-06 ENCOUNTER — Other Ambulatory Visit: Payer: Self-pay | Admitting: Gastroenterology

## 2022-05-06 ENCOUNTER — Ambulatory Visit (HOSPITAL_BASED_OUTPATIENT_CLINIC_OR_DEPARTMENT_OTHER)
Admission: RE | Admit: 2022-05-06 | Discharge: 2022-05-06 | Disposition: A | Discharge status: Home / Self Care | Payer: Medicare HMO | Source: Ambulatory Visit | Attending: Gastroenterology | Admitting: Gastroenterology

## 2022-05-06 ENCOUNTER — Encounter (HOSPITAL_BASED_OUTPATIENT_CLINIC_OR_DEPARTMENT_OTHER): Payer: Self-pay

## 2022-05-06 DIAGNOSIS — R1032 Left lower quadrant pain: Secondary | ICD-10-CM

## 2022-05-06 DIAGNOSIS — R1011 Right upper quadrant pain: Secondary | ICD-10-CM

## 2022-05-06 LAB — POCT I-STAT CREATININE: Creatinine, Ser: 1.2 mg/dL (ref 0.61–1.24)

## 2022-05-06 MED ORDER — IOHEXOL 300 MG/ML  SOLN
100.0000 mL | Freq: Once | INTRAMUSCULAR | Status: AC | PRN
  Administered 2022-05-06: 100 mL via INTRAVENOUS

## 2022-07-01 ENCOUNTER — Encounter: Payer: Self-pay | Admitting: Nurse Practitioner

## 2022-07-01 ENCOUNTER — Ambulatory Visit: Payer: Medicare HMO | Admitting: Internal Medicine

## 2022-07-01 ENCOUNTER — Ambulatory Visit (INDEPENDENT_AMBULATORY_CARE_PROVIDER_SITE_OTHER): Payer: Medicare HMO | Admitting: Nurse Practitioner

## 2022-07-01 ENCOUNTER — Ambulatory Visit (INDEPENDENT_AMBULATORY_CARE_PROVIDER_SITE_OTHER): Payer: Medicare HMO | Admitting: Internal Medicine

## 2022-07-01 DIAGNOSIS — J449 Chronic obstructive pulmonary disease, unspecified: Secondary | ICD-10-CM | POA: Diagnosis not present

## 2022-07-01 DIAGNOSIS — R911 Solitary pulmonary nodule: Secondary | ICD-10-CM

## 2022-07-01 DIAGNOSIS — J4489 Other specified chronic obstructive pulmonary disease: Secondary | ICD-10-CM

## 2022-07-01 LAB — PULMONARY FUNCTION TEST
DL/VA % pred: 103 %
DL/VA: 4.41 ml/min/mmHg/L
DLCO cor % pred: 96 %
DLCO cor: 24.58 ml/min/mmHg
DLCO unc % pred: 96 %
DLCO unc: 24.58 ml/min/mmHg
FEF 25-75 Post: 2.12 L/sec
FEF 25-75 Pre: 1.61 L/sec
FEF2575-%Change-Post: 31 %
FEF2575-%Pred-Post: 77 %
FEF2575-%Pred-Pre: 58 %
FEV1-%Change-Post: 6 %
FEV1-%Pred-Post: 75 %
FEV1-%Pred-Pre: 70 %
FEV1-Post: 2.48 L
FEV1-Pre: 2.33 L
FEV1FVC-%Change-Post: 3 %
FEV1FVC-%Pred-Pre: 94 %
FEV6-%Change-Post: 3 %
FEV6-%Pred-Post: 81 %
FEV6-%Pred-Pre: 78 %
FEV6-Post: 3.37 L
FEV6-Pre: 3.25 L
FEV6FVC-%Change-Post: 0 %
FEV6FVC-%Pred-Post: 104 %
FEV6FVC-%Pred-Pre: 104 %
FVC-%Change-Post: 2 %
FVC-%Pred-Post: 77 %
FVC-%Pred-Pre: 75 %
FVC-Post: 3.37 L
FVC-Pre: 3.28 L
Post FEV1/FVC ratio: 73 %
Post FEV6/FVC ratio: 100 %
Pre FEV1/FVC ratio: 71 %
Pre FEV6/FVC Ratio: 99 %
RV % pred: 122 %
RV: 2.59 L
TLC % pred: 94 %
TLC: 6.15 L

## 2022-07-01 NOTE — Progress Notes (Signed)
PFT done today. 

## 2022-07-01 NOTE — Patient Instructions (Addendum)
Continue Breztri 2 puffs Twice daily. Brush tongue and rinse mouth afterwards  Continue Albuterol inhaler 2 puffs every 6 hours as needed for shortness of breath or wheezing. Notify if symptoms persist despite rescue inhaler/neb use.  Can use mucinex Twice daily as needed for chest congestion  Follow up in 3 months with Dr. Shearon Stalls or sooner if needed

## 2022-07-01 NOTE — Assessment & Plan Note (Signed)
Previous 2-4 mm lung nodules on CT chest from May 2023. Plan for repeat CT May 2024.

## 2022-07-01 NOTE — Assessment & Plan Note (Signed)
Low ERV on PFTs. Discussed correlation between his dyspnea and deconditioning/obesity. Healthy weight loss measures and increased activity level encouraged.

## 2022-07-01 NOTE — Progress Notes (Signed)
@Patient  ID: , male    DOB: [birth year], 61 y.o.   MRN: [id number]  No chief complaint on file.   Referring provider: [id number], MD  HPI: 61 year old male, former smoker followed for COPD with chronic bronchitis and emphysema. He is a patient of Dr. 67 and last seen in office on 03/31/2022. Past medical history significant for hx of CVA, PAF on Eliquis, HTN, DM, OSA on CPAP, GERD, HLD.   TEST/EVENTS:   03/31/2022: OV with Dr. 04/02/2022 for initial consult. History of childhood asthma and tracheostomy. He is on Anoro, prescribed by his PCP for COPD. Only using it a few times a month and then using his rescue once a day. His throat gets irritated with Anoro. He struggles with recurrent bronchitis or pneumonia. Worsening SOB on exertion. Cough with green sputum, wheezing, and chest tightness. Recommended step up to [facility]. Ordered PFTs. Plan for CT chest in May 2024 for lung cancer screening/eval of pulmonary nodules.   07/01/2022: Today - follow up Patient presents today for follow up after pulmonary function testing.  His PFTs showed a ratio of 73 and FEV1 of 75; normal lung volumes and DLCO.  He did use his Breztri prior to his pulmonary function testing.  Today, he reports that he has been feeling better since he started on the [place].  Feels like it is worked well for him.  He still has some shortness of breath with stair climbing or very strenuous activity but otherwise feels like his activity tolerance has improved.  Occasional a.m. cough with gray to green sputum, which is his normal.  Has not noticed much wheezing or chest tightness since making the change.  Denies any hemoptysis, weight loss, increased leg swelling, increased chest congestion.  Using [place] twice a day.  Rarely using albuterol.  Allergies  Allergen Reactions   Tylox [Oxycodone-Acetaminophen] Hives, Other (See Comments) and Rash    Sweating, shaking Reaction: Tremors and diaphoresis    Gabapentin      Gained weight and retained fluid    Adhesive [Tape] Rash    States IV tape is fine    Immunization History  Administered Date(s) Administered   Influenza Inj Mdck Quad Pf 07/18/2021, 06/16/2022   Influenza,inj,Quad PF,6+ Mos 07/05/2014, 08/05/2015, 06/12/2016, 06/29/2017   Influenza-Unspecified 07/05/2014, 08/05/2015, 06/12/2016, 06/29/2017   PFIZER(Purple Top)SARS-COV-2 Vaccination 11/27/2019, 12/19/2019, 08/16/2020   PNEUMOCOCCAL CONJUGATE-20 11/19/2021   Pfizer Covid-19 Vaccine Bivalent Booster 59yrs & up 08/13/2021   Pneumococcal Polysaccharide-23 07/05/2014, 06/29/2017, 11/07/2018   Tdap 11/21/2010, 05/11/2012, 06/16/2022    Past Medical History:  Diagnosis Date   Aneurysm (HCC)    brain   Arthritis    Atrial fibrillation (HCC)    Bipolar disorder (HCC)    was on meds but was taken off 2 yrs ago and none since   Burning pain    in both legs-seeing Dr.Sethi for this   CAD (coronary artery disease)    Cataracts, bilateral    Childhood asthma    Last asthma attack at age 50; History of trach at 16 months (12/17/2016)   Chronic kidney disease 2009   Chronic lower back pain    COPD (chronic obstructive pulmonary disease) (HCC) 06/2012   uses Spiriva and Albuterol daily as needed   Depression    Family history of adverse reaction to anesthesia    "Mom and sister have PONV"   GERD (gastroesophageal reflux disease)    H/O hiatal hernia    Headache(784.0)    "  q 2-3 weeks; daily last 2 wks" (12/17/2016)   Hyperlipidemia    takes Ramipril daily   Hypertension    takes Ramipril daily   Joint pain    Joint swelling    Middle cerebral artery aneurysm    right   Nocturia    Numbness    both arms    Pneumonia    hx of-2014   PONV (postoperative nausea and vomiting)    Pt reports nausea only.   Sleep apnea    dx just this past week   08/21/2017   Stroke [facility]) 2015   "drag left foot more since; have to wear glasses now" (12/17/2016)   Type II diabetes mellitus (HCC)    takes  Metformin daily   dx 2015   Umbilical hernia    Urinary frequency    Wears dentures    Wears glasses    and contact lenses    Tobacco History: Social History   Tobacco Use  Smoking Status Former   Packs/day: 1.50   Years: 40.00   Total pack years: 60.00   Types: Cigarettes   Start date: 01/15/1975   Quit date: 06/02/2014   Years since quitting: 8.0  Smokeless Tobacco Never   Counseling given: Not Answered   Outpatient Medications Prior to Visit  Medication Sig Dispense Refill   albuterol (PROVENTIL HFA;VENTOLIN HFA) 108 (90 BASE) MCG/ACT inhaler Inhale 2 puffs into the lungs every 6 (six) hours as needed for wheezing or shortness of breath.      amiodarone (PACERONE) 200 MG tablet Take 100 mg by mouth daily.     apixaban (ELIQUIS) 5 MG TABS tablet Take 1 tablet (5 mg total) by mouth 2 (two) times daily. 60 tablet    atorvastatin (LIPITOR) 40 MG tablet Take 40 mg by mouth daily.     Budeson-Glycopyrrol-Formoterol (BREZTRI AEROSPHERE) 160-9-4.8 MCG/ACT AERO Inhale 2 puffs into the lungs in the morning and at bedtime. 1 each 5   clopidogrel (PLAVIX) 75 MG tablet Take 0.5 tablets (37.5 mg total) by mouth daily.     KLOR-CON M10 10 MEQ tablet Take 10 mEq by mouth daily.     metFORMIN (GLUCOPHAGE) 500 MG tablet Take 1 tablet (500 mg total) by mouth 2 (two) times daily with a meal. 60 tablet 0   nortriptyline (PAMELOR) 10 MG capsule Take 10 mg by mouth at bedtime.     pantoprazole (PROTONIX) 40 MG tablet Take 1 tablet (40 mg total) by mouth daily as needed (for heartburn). 30 tablet 3   valACYclovir (VALTREX) 1000 MG tablet Take 1,000 mg by mouth 3 (three) times daily.     Budeson-Glycopyrrol-Formoterol (BREZTRI AEROSPHERE) 160-9-4.8 MCG/ACT AERO Inhale 2 puffs into the lungs in the morning and at bedtime. 5.9 g 0   No facility-administered medications prior to visit.     Review of Systems:   Constitutional: No weight loss or gain, night sweats, fevers, chills, fatigue, or  lassitude. HEENT: No headaches, difficulty swallowing, tooth/dental problems, or sore throat. No sneezing, itching, ear ache, nasal congestion, or post nasal drip CV:  No chest pain, orthopnea, PND, swelling in lower extremities, anasarca, dizziness, palpitations, syncope Resp: +shortness of breath with exertion (improved); AM cough. No excess mucus or change in color of mucus. No hemoptysis. No wheezing.  No chest wall deformity GI:  No heartburn, indigestion, abdominal pain, nausea, vomiting, diarrhea, change in bowel habits, loss of appetite, bloody stools.  Neuro: No dizziness or lightheadedness.  Psych: No depression or anxiety. Mood  stable.     Physical Exam:  BP 130/70 (BP Location: Right Arm)   Pulse 64   Ht 5' 7.5" (1.715 m)   Wt 261 lb (118.4 kg)   SpO2 95%   BMI 40.28 kg/m   GEN: Pleasant, interactive, well-appearing; morbidly obese; in no acute distress. HEENT:  Normocephalic and atraumatic. PERRLA. Sclera white. Nasal turbinates pink, moist and patent bilaterally. No rhinorrhea present. Oropharynx pink and moist, without exudate or edema. No lesions, ulcerations, or postnasal drip.  NECK:  Supple w/ fair ROM. No lymphadenopathy.   CV: RRR, no m/r/g, no peripheral edema. Pulses intact, +2 bilaterally. No cyanosis, pallor or clubbing. PULMONARY:  Unlabored, regular breathing. Clear bilaterally A&P w/o wheezes/rales/rhonchi. No accessory muscle use. No dullness to percussion. GI: BS present and normoactive. Soft, non-tender to palpation. No organomegaly or masses detected. MSK: No erythema, warmth or tenderness. No deformities or joint swelling noted.  Neuro: A/Ox3. No focal deficits noted.   Skin: Warm, no lesions or rashe Psych: Normal affect and behavior. Judgement and thought content appropriate.     Lab Results:  CBC    Component Value Date/Time   WBC 10.7 (H) 01/02/2022 1444   RBC 5.22 01/02/2022 1444   HGB 15.7 01/02/2022 1444   HCT 47.0 01/02/2022 1444    PLT 335 01/02/2022 1444   MCV 90.0 01/02/2022 1444   MCH 30.1 01/02/2022 1444   MCHC 33.4 01/02/2022 1444   RDW 12.1 01/02/2022 1444   LYMPHSABS 1.7 01/02/2022 1444   MONOABS 0.9 01/02/2022 1444   EOSABS 0.1 01/02/2022 1444   BASOSABS 0.0 01/02/2022 1444    BMET    Component Value Date/Time   NA 137 01/03/2022 0102   K 3.5 01/03/2022 0102   CL 102 01/03/2022 0102   CO2 30 01/03/2022 0102   GLUCOSE 115 (H) 01/03/2022 0102   BUN 17 01/03/2022 0102   CREATININE 1.20 05/06/2022 1858   CALCIUM 9.5 01/03/2022 0102   GFRNONAA >60 01/03/2022 0102   GFRAA >60 05/13/2018 0700    BNP No results found for: "BNP"   Imaging:  No results found.       Latest Ref Rng & Units 07/01/2022   12:40 PM  PFT Results  FVC-Pre L 3.28  P  FVC-Predicted Pre % 75  P  FVC-Post L 3.37  P  FVC-Predicted Post % 77  P  Pre FEV1/FVC % % 71  P  Post FEV1/FCV % % 73  P  FEV1-Pre L 2.33  P  FEV1-Predicted Pre % 70  P  FEV1-Post L 2.48  P  DLCO uncorrected ml/min/mmHg 24.58  P  DLCO UNC% % 96  P  DLCO corrected ml/min/mmHg 24.58  P  DLCO COR %Predicted % 96  P  DLVA Predicted % 103  P  TLC L 6.15  P  TLC % Predicted % 94  P  RV % Predicted % 122  P    P Preliminary result    No results found for: "NITRICOXIDE"      Assessment & Plan:   COPD (chronic obstructive pulmonary disease) with chronic bronchitis (HCC) Pulmonary function testing today with ratio >70; however, he used Breztri prior to exam. Given this and based on his FEV1 (75%), he likely has moderate COPD. He has had good response to [place]; we will continue him on triple therapy today.   Patient Instructions  Continue Breztri 2 puffs Twice daily. Brush tongue and rinse mouth afterwards  Continue Albuterol inhaler 2 puffs every 6  hours as needed for shortness of breath or wheezing. Notify if symptoms persist despite rescue inhaler/neb use.  Can use mucinex Twice daily as needed for chest congestion  Follow up in 3 months  with Dr. Celine Mansesai or sooner if needed     Morbid obesity (HCC) Low ERV on PFTs. Discussed correlation between his dyspnea and deconditioning/obesity. Healthy weight loss measures and increased activity level encouraged.   Lung nodule Previous 2-4 mm lung nodules on CT chest from May 2023. Plan for repeat CT May 2024.    I spent 28 minutes of dedicated to the care of this patient on the date of this encounter to include pre-visit review of records, face-to-face time with the patient discussing conditions above, post visit ordering of testing, clinical documentation with the electronic health record, making appropriate referrals as documented, and communicating necessary findings to members of the patients care team.  Noemi ChapelKatherine V Morio Widen, NP 07/01/2022  Pt aware and understands NP's role.

## 2022-07-01 NOTE — Assessment & Plan Note (Signed)
Pulmonary function testing today with ratio >70; however, he used Breztri prior to exam. Given this and based on his FEV1 (75%), he likely has moderate COPD. He has had good response to [place]; we will continue him on triple therapy today.   Patient Instructions  Continue Breztri 2 puffs Twice daily. Brush tongue and rinse mouth afterwards  Continue Albuterol inhaler 2 puffs every 6 hours as needed for shortness of breath or wheezing. Notify if symptoms persist despite rescue inhaler/neb use.  Can use mucinex Twice daily as needed for chest congestion  Follow up in 3 months with Dr. Shearon Stalls or sooner if needed

## 2022-09-30 ENCOUNTER — Encounter: Payer: Self-pay | Admitting: Nurse Practitioner

## 2022-09-30 ENCOUNTER — Ambulatory Visit (INDEPENDENT_AMBULATORY_CARE_PROVIDER_SITE_OTHER): Payer: Medicare HMO | Admitting: Nurse Practitioner

## 2022-09-30 VITALS — BP 124/78 | HR 61 | Ht 67.5 in | Wt 248.0 lb

## 2022-09-30 DIAGNOSIS — J4489 Other specified chronic obstructive pulmonary disease: Secondary | ICD-10-CM | POA: Diagnosis not present

## 2022-09-30 DIAGNOSIS — R911 Solitary pulmonary nodule: Secondary | ICD-10-CM | POA: Diagnosis not present

## 2022-09-30 NOTE — Assessment & Plan Note (Signed)
Moderate COPD with chronic bronchitis; compensated on current regimen. No changes made today. Encouraged him to remain active. COPD action plan in place.  Patient Instructions  Continue Breztri 2 puffs Twice daily. Brush tongue and rinse mouth afterwards  Continue Albuterol inhaler 2 puffs every 6 hours as needed for shortness of breath or wheezing. Notify if symptoms persist despite rescue inhaler/neb use.  Can use mucinex Twice daily as needed for chest congestion   Follow up in 6 months with Dr. Celine Mans or sooner if needed

## 2022-09-30 NOTE — Progress Notes (Signed)
 @Patient  ID: [name], male    DOB: 01/25/1961, 61 y.o.   MRN: [id number]  Chief Complaint  Patient presents with   Follow-up    Pt f/u for COPD, he is feeling well. The only issue is when using the inhaler he has an increase of white mucus.     Referring provider: Tracey HarriesBouska, David, MD  HPI: 61 year old male, former smoker followed for COPD with chronic bronchitis and emphysema. He is a patient of Dr. Humphrey Rollsesai's and last seen in office on 07[facility]01/2022 [facility]. Past medical history significant for hx of CVA, PAF on Eliquis, HTN, DM, OSA on CPAP, GERD, HLD.   TEST/EVENTS:  07/01/2022 PFT: FVC 75, FEV1 70, ratio 73, TLC 94, DLCO 96. No BD  03/31/2022: OV with Dr. Celine Mansesai for initial consult. History of childhood asthma and tracheostomy. He is on Anoro, prescribed by his PCP for COPD. Only using it a few times a month and then using his rescue once a day. His throat gets irritated with Anoro. He struggles with recurrent bronchitis or pneumonia. Worsening SOB on exertion. Cough with green sputum, wheezing, and chest tightness. [facility] Ordered PFTs. Plan for CT chest in May 2024 for lung cancer screening/eval of pulmonary nodules.   07/01/2022: OV with Senora Lacson NP for f/u after pulmonary function testing.  His PFTs showed a ratio of 73 and FEV1 of 75; normal lung volumes and DLCO.  He did use his Breztri prior to his pulmonary function testing.  Today, he reports that he has been feeling better since he started on the [place].  Feels like it is worked well for him.  He still has some shortness of breath with stair climbing or very strenuous activity but otherwise feels like his activity tolerance has improved.  Occasional a.m. cough with gray to green sputum, which is his normal.  Has not noticed much wheezing or chest tightness since making the change.  Denies any hemoptysis, weight loss, increased leg swelling, increased chest congestion.  Using [place] twice a day.  Rarely using  albuterol. Pulmonary function testing with ratio >70; however, he used Breztri prior to exam. Given this and based on his FEV1 (75%), he likely has moderate COPD. He has had good response to [facility]; we will continue him on triple therapy today.    09/30/2022: Today - follow up Patient presents today for follow up. His mother is one of our patients as well and I saw both of them earlier today. He has been helping to care for her as she lives with him and has been having a lot of cardiac problems recently, so he has been worried about that. Other than that, he is doing well. Feels like his breathing is stable. Has a daily AM productive cough, which is normal for him. The [facility] seems to help him clear this out. No chest congestion or wheezing. Denies any flares since he was here last requiring steroids or abx.   Allergies  Allergen Reactions   Tylox [Oxycodone-Acetaminophen] Hives, Other (See Comments) and Rash    Sweating, shaking Reaction: Tremors and diaphoresis    Gabapentin     Gained weight and retained fluid    Adhesive [Tape] Rash    States IV tape is fine    Immunization History  Administered Date(s) Administered   Influenza Inj Mdck Quad Pf 07/18/2021, 06/16/2022   Influenza,inj,Quad PF,6+ Mos 07/05/2014, 08/05/2015, 06/12/2016, 06/29/2017   Influenza-Unspecified 07/05/2014, 08/05/2015, 06/12/2016, 06/29/2017   PFIZER(Purple  Top)SARS-COV-2 Vaccination 11/27/2019, 12/19/2019, 08/16/2020   PNEUMOCOCCAL CONJUGATE-20 11/19/2021   Pfizer Covid-19 Vaccine Bivalent Booster 82yrs & up 08/13/2021   Pneumococcal Polysaccharide-23 07/05/2014, 06/29/2017, 11/07/2018   Tdap 11/21/2010, 05/11/2012, 06/16/2022    Past Medical History:  Diagnosis Date   Aneurysm (HCC)    brain   Arthritis    Atrial fibrillation (HCC)    Bipolar disorder (HCC)    was on meds but was taken off 2 yrs ago and none since   Burning pain    in both legs-seeing Dr.Sethi for this   CAD (coronary artery  disease)    Cataracts, bilateral    Childhood asthma    Last asthma attack at age 79; History of trach at 16 months (12/17/2016)   Chronic kidney disease 2009   Chronic lower back pain    COPD (chronic obstructive pulmonary disease) (HCC) 06/2012   uses Spiriva and Albuterol daily as needed   Depression    Family history of adverse reaction to anesthesia    "Mom and sister have PONV"   GERD (gastroesophageal reflux disease)    H/O hiatal hernia    Headache(784.0)    "q 2-3 weeks; daily last 2 wks" (12/17/2016)   Hyperlipidemia    takes Ramipril daily   Hypertension    takes Ramipril daily   Joint pain    Joint swelling    Middle cerebral artery aneurysm    right   Nocturia    Numbness    both arms    Pneumonia    hx of-2014   PONV (postoperative nausea and vomiting)    Pt reports nausea only.   Sleep apnea    dx just this past week   08/21/2017   Stroke [facility]) 2015   "drag left foot more since; have to wear glasses now" (12/17/2016)   Type II diabetes mellitus (HCC)    takes Metformin daily   dx 2015   Umbilical hernia    Urinary frequency    Wears dentures    Wears glasses    and contact lenses    Tobacco History: Social History   Tobacco Use  Smoking Status Former   Packs/day: 1.50   Years: 40.00   Total pack years: 60.00   Types: Cigarettes   Start date: 01/15/1975   Quit date: 06/02/2014   Years since quitting: 8.3  Smokeless Tobacco Never   Counseling given: Not Answered   Outpatient Medications Prior to Visit  Medication Sig Dispense Refill   albuterol (PROVENTIL HFA;VENTOLIN HFA) 108 (90 BASE) MCG/ACT inhaler Inhale 2 puffs into the lungs every 6 (six) hours as needed for wheezing or shortness of breath.      amiodarone (PACERONE) 200 MG tablet Take 100 mg by mouth daily.     apixaban (ELIQUIS) 5 MG TABS tablet Take 1 tablet (5 mg total) by mouth 2 (two) times daily. 60 tablet    atorvastatin (LIPITOR) 40 MG tablet Take 40 mg by mouth daily.      Budeson-Glycopyrrol-Formoterol (BREZTRI AEROSPHERE) 160-9-4.8 MCG/ACT AERO Inhale 2 puffs into the lungs in the morning and at bedtime. 1 each 5   clopidogrel (PLAVIX) 75 MG tablet Take 0.5 tablets (37.5 mg total) by mouth daily.     KLOR-CON M10 10 MEQ tablet Take 10 mEq by mouth daily.     metFORMIN (GLUCOPHAGE) 500 MG tablet Take 1 tablet (500 mg total) by mouth 2 (two) times daily with a meal. 60 tablet 0   nortriptyline (PAMELOR) 10 MG capsule  Take 10 mg by mouth at bedtime.     pantoprazole (PROTONIX) 40 MG tablet Take 1 tablet (40 mg total) by mouth daily as needed (for heartburn). 30 tablet 3   phentermine 30 MG capsule Take 30 mg by mouth daily.     valACYclovir (VALTREX) 1000 MG tablet Take 1,000 mg by mouth 3 (three) times daily.     No facility-administered medications prior to visit.     Review of Systems:   Constitutional: No weight loss or gain, night sweats, fevers, chills, fatigue, or lassitude. HEENT: No headaches, difficulty swallowing, tooth/dental problems, or sore throat. No sneezing, itching, ear ache, nasal congestion, or post nasal drip CV:  No chest pain, orthopnea, PND, swelling in lower extremities, anasarca, dizziness, palpitations, syncope Resp: +shortness of breath with exertion; AM cough. No excess mucus or change in color of mucus. No hemoptysis. No wheezing.  No chest wall deformity GI:  No heartburn, indigestion, abdominal pain, nausea, vomiting, diarrhea, change in bowel habits, loss of appetite, bloody stools.  Neuro: No dizziness or lightheadedness.  Psych: No depression or anxiety. Mood stable.     Physical Exam:  BP 124/78   Pulse 61   Ht 5' 7.5" (1.715 m)   Wt 248 lb (112.5 kg)   SpO2 96%   BMI 38.27 kg/m   GEN: Pleasant, interactive, well-appearing; morbidly obese; in no acute distress. HEENT:  Normocephalic and atraumatic. PERRLA. Sclera white. Nasal turbinates pink, moist and patent bilaterally. No rhinorrhea present. Oropharynx pink and  moist, without exudate or edema. No lesions, ulcerations, or postnasal drip.  NECK:  Supple w/ fair ROM. No lymphadenopathy.   CV: RRR, no m/r/g, no peripheral edema. Pulses intact, +2 bilaterally. No cyanosis, pallor or clubbing. PULMONARY:  Unlabored, regular breathing. Clear bilaterally A&P w/o wheezes/rales/rhonchi. No accessory muscle use. No dullness to percussion. GI: BS present and normoactive. Soft, non-tender to palpation. No organomegaly or masses detected. MSK: No erythema, warmth or tenderness. No deformities or joint swelling noted.  Neuro: A/Ox3. No focal deficits noted.   Skin: Warm, no lesions or rashe Psych: Normal affect and behavior. Judgement and thought content appropriate.     Lab Results:  CBC    Component Value Date/Time   WBC 10.7 (H) 01/02/2022 1444   RBC 5.22 01/02/2022 1444   HGB 15.7 01/02/2022 1444   HCT 47.0 01/02/2022 1444   PLT 335 01/02/2022 1444   MCV 90.0 01/02/2022 1444   MCH 30.1 01/02/2022 1444   MCHC 33.4 01/02/2022 1444   RDW 12.1 01/02/2022 1444   LYMPHSABS 1.7 01/02/2022 1444   MONOABS 0.9 01/02/2022 1444   EOSABS 0.1 01/02/2022 1444   BASOSABS 0.0 01/02/2022 1444    BMET    Component Value Date/Time   NA 137 01/03/2022 0102   K 3.5 01/03/2022 0102   CL 102 01/03/2022 0102   CO2 30 01/03/2022 0102   GLUCOSE 115 (H) 01/03/2022 0102   BUN 17 01/03/2022 0102   CREATININE 1.20 05/06/2022 1858   CALCIUM 9.5 01/03/2022 0102   GFRNONAA >60 01/03/2022 0102   GFRAA >60 05/13/2018 0700    BNP No results found for: "BNP"   Imaging:  No results found.       Latest Ref Rng & Units 07/01/2022   12:40 PM  PFT Results  FVC-Pre L 3.28   FVC-Predicted Pre % 75   FVC-Post L 3.37   FVC-Predicted Post % 77   Pre FEV1/FVC % % 71   Post FEV1/FCV % % 73  FEV1-Pre L 2.33   FEV1-Predicted Pre % 70   FEV1-Post L 2.48   DLCO uncorrected ml/min/mmHg 24.58   DLCO UNC% % 96   DLCO corrected ml/min/mmHg 24.58   DLCO COR %Predicted %  96   DLVA Predicted % 103   TLC L 6.15   TLC % Predicted % 94   RV % Predicted % 122     No results found for: "NITRICOXIDE"      Assessment & Plan:   COPD (chronic obstructive pulmonary disease) with chronic bronchitis (HCC) Moderate COPD with chronic bronchitis; compensated on current regimen. No changes made today. Encouraged him to remain active. COPD action plan in place.  Patient Instructions  Continue Breztri 2 puffs Twice daily. Brush tongue and rinse mouth afterwards  Continue Albuterol inhaler 2 puffs every 6 hours as needed for shortness of breath or wheezing. Notify if symptoms persist despite rescue inhaler/neb use.  Can use mucinex Twice daily as needed for chest congestion   Follow up in 6 months with Dr. Celine Mans or sooner if needed    Lung nodule Stable on previous imaging; 2-4 mm. Repeat CT chest 2024, as previously ordered.   I spent 28 minutes of dedicated to the care of this patient on the date of this encounter to include pre-visit review of records, face-to-face time with the patient discussing conditions above, post visit ordering of testing, clinical documentation with the electronic health record, making appropriate referrals as documented, and communicating necessary findings to members of the patients care team.  Noemi Chapel, NP 09/30/2022  Pt aware and understands NP's role.

## 2022-09-30 NOTE — Assessment & Plan Note (Signed)
Stable on previous imaging; 2-4 mm. Repeat CT chest 2024, as previously ordered.

## 2022-09-30 NOTE — Patient Instructions (Addendum)
Continue Breztri 2 puffs Twice daily. Brush tongue and rinse mouth afterwards  Continue Albuterol inhaler 2 puffs every 6 hours as needed for shortness of breath or wheezing. Notify if symptoms persist despite rescue inhaler/neb use.  Can use mucinex Twice daily as needed for chest congestion   Follow up in 6 months with Dr. Celine Mans or sooner if needed

## 2022-11-24 DIAGNOSIS — Z7901 Long term (current) use of anticoagulants: Secondary | ICD-10-CM | POA: Insufficient documentation

## 2022-11-30 DIAGNOSIS — M24131 Other articular cartilage disorders, right wrist: Secondary | ICD-10-CM | POA: Insufficient documentation

## 2022-11-30 DIAGNOSIS — M25831 Other specified joint disorders, right wrist: Secondary | ICD-10-CM | POA: Insufficient documentation

## 2022-12-11 ENCOUNTER — Telehealth: Payer: Self-pay

## 2022-12-11 NOTE — Telephone Encounter (Signed)
Pt's mother, Hoyle Sauer, called stating that the pt's leg was swollen, his calf is tight and cramping, and he has a warm sensation trickling down his leg.  Reviewed pt's chart, returned call for clarification, two identifiers used. Pt has not been seen in this office since 08/2018, so he needs a referral to be seen again. Pt just had surgery on his wrist yesterday. Informed pt that he needed to call his surgeon because his issue may be related to his surgery, such as a possible DVT. They can address it and send him to have testing or give him advice. He also needs to get a referral if he needs to be seen in this office again. Confirmed understanding.

## 2022-12-14 ENCOUNTER — Ambulatory Visit (HOSPITAL_COMMUNITY)
Admission: RE | Admit: 2022-12-14 | Discharge: 2022-12-14 | Disposition: A | Discharge status: Home / Self Care | Payer: Medicare HMO | Source: Ambulatory Visit | Attending: Vascular Surgery | Admitting: Vascular Surgery

## 2022-12-14 ENCOUNTER — Other Ambulatory Visit (HOSPITAL_COMMUNITY): Payer: Self-pay | Admitting: Surgery

## 2022-12-14 ENCOUNTER — Other Ambulatory Visit: Payer: Self-pay

## 2022-12-14 DIAGNOSIS — M79669 Pain in unspecified lower leg: Secondary | ICD-10-CM | POA: Diagnosis present

## 2022-12-16 NOTE — Progress Notes (Addendum)
[facility]           OF [place]  History and Physical   [name] is a 62 y.o. male who presents with leg swelling and calf cramping bilaterally with left worse than right.   He states his son was going to early college and he had to meet with his principal.  He had to go up a flight of stairs and down a long hallway and about 1/2 way down the hallway, he developed cramping and tightening up of his calves where he had to sit for about 30 minutes.  He states it happened again after that with shorter distance but wasn't as bad,  He states that he had surgery on the left 5th toe about 3 years ago and he did not have any trouble healing this.  He does not have any non healing wounds or rest pain.  He states he does have tingling in both feet at night.  He has type 2 DM.    He states he gets cramps in his legs at night.  He has not had any hx of venous procedures.  He does have skin color changes on both legs.  He states he has worn compression a few times but wasn't helpful.  He states that when he put them on, they tore.  He tells me he is on a diuretic. He states he has a wedge that he elevates his legs on.    He states that he did have wrist surgery and he was off his Eliquis for about 7 days (he was on this for hx of afib but now in NSR on amiodarone.  They were worried about the swelling and possible DVT and he is referred for evaluation.    He was previously seen by Dr. Edilia Bo in 2019 for BLE swelling.  Previous duplex revealed no evidence of DVT BLE.  He felt he likely had some chronic venous insufficiency and he recommended intermittent leg elevation and compression socks.    Pt is on Eliquis for hx of arrhythmias in the past. He has hx of carotid stenting and clipping of brain aneurysms by Dr. [name].  He has hx of stroke in 2015 affecting the left side.   The pt is on a statin for cholesterol management.  The pt is not on a daily aspirin.   Other  AC:  Eliquis/Plavix The pt is not on medication  for hypertension.   The pt is diabetic.   Tobacco hx:  former  Pt does have family hx of AAA with his grandfather.   Past Medical History:  Diagnosis Date   Aneurysm (HCC)    brain   Arthritis    Atrial fibrillation (HCC)    Bipolar disorder (HCC)    was on meds but was taken off 2 yrs ago and none since   Burning pain    in both legs-seeing Dr.Sethi for this   CAD (coronary artery disease)    Cataracts, bilateral    Childhood asthma    Last asthma attack at age 58; History of trach at 16 months (12/17/2016)   Chronic kidney disease 2009   Chronic lower back pain    COPD (chronic obstructive pulmonary disease) (HCC) 06/2012   uses Spiriva and Albuterol daily as needed   Depression    Family history of adverse reaction to anesthesia    "Mom and sister have PONV"   GERD (gastroesophageal reflux  disease)    H/O hiatal hernia    Headache(784.0)    "q 2-3 weeks; daily last 2 wks" (12/17/2016)   Hyperlipidemia    takes Ramipril daily   Hypertension    takes Ramipril daily   Joint pain    Joint swelling    Middle cerebral artery aneurysm    right   Nocturia    Numbness    both arms    Pneumonia    hx of-2014   PONV (postoperative nausea and vomiting)    Pt reports nausea only.   Sleep apnea    dx just this past week   08/21/2017   Stroke [facility]) 2015   "drag left foot more since; have to wear glasses now" (12/17/2016)   Type II diabetes mellitus (HCC)    takes Metformin daily   dx 2015   Umbilical hernia    Urinary frequency    Wears dentures    Wears glasses    and contact lenses    Past Surgical History:  Procedure Laterality Date   ANEURYSM COILING  2015   CARDIOVERSION N/A 12/18/2016   Procedure: CARDIOVERSION;  Surgeon: Orpah Cobb, MD;  [facility]: [facility];  Service: Cardiovascular;  Laterality: N/A;   COLONOSCOPY     COLONOSCOPY N/A 04/08/2018   Procedure: COLONOSCOPY;  Surgeon: Jeani Hawking, MD;  Location: [facility]  ENDOSCOPY;  Service: Endoscopy;  Laterality: N/A;   COLONOSCOPY N/A 10/18/2020   Procedure: COLONOSCOPY;  Surgeon: Jeani Hawking, MD;  Location: [facility] ENDOS[facility];  Service: Endoscopy;  Laterality: N/A;   ESOPHAGOGASTRODUODENOSCOPY N/A 04/08/2018   Procedure: ESOPHAGOGASTRODUODENOSCOPY (EGD);  Surgeon: Jeani Hawking, MD;  Location: [facility];  Service: Endoscopy;  Laterality: N/A;   EXCISIONAL HEMORRHOIDECTOMY  1980s   "soon after rectal OR"   GANGLION CYST EXCISION Right    HEMOSTASIS CLIP PLACEMENT  10/18/2020   Procedure: HEMOSTASIS CLIP PLACEMENT;  Surgeon: Jeani Hawking, MD;  Location: [facility];  Service: Endoscopy;;   HOT HEMOSTASIS N/A 04/08/2018   Procedure: HOT HEMOSTASIS (ARGON PLASMA COAGULATION/BICAP);  Surgeon: Jeani Hawking, MD;  Location: [facility] ENDOSCOPY;  Service: Endoscopy;  Laterality: N/A;   HOT HEMOSTASIS N/A 10/18/2020   Procedure: HOT HEMOSTASIS (ARGON PLASMA COAGULATION/BICAP);  Surgeon: Jeani Hawking, MD;  Location: [facility] ENDOSCOPY;  Service: Endoscopy;  Laterality: N/A;   IR 3D INDEPENDENT WKST  05/10/2017   IR 3D INDEPENDENT WKST  06/28/2017   IR ANGIO INTRA EXTRACRAN SEL COM CAROTID INNOMINATE BILAT MOD SED  04/22/2017   IR ANGIO INTRA EXTRACRAN SEL COM CAROTID INNOMINATE BILAT MOD SED  11/22/2017   IR ANGIO INTRA EXTRACRAN SEL COM CAROTID INNOMINATE BILAT MOD SED  05/13/2018   IR ANGIO INTRA EXTRACRAN SEL INTERNAL CAROTID UNI L MOD SED  12/27/2017   IR ANGIO INTRA EXTRACRAN SEL INTERNAL CAROTID UNI R MOD SED  05/10/2017   IR ANGIO INTRA EXTRACRAN SEL INTERNAL CAROTID UNI R MOD SED  06/28/2017   IR ANGIO VERTEBRAL SEL SUBCLAVIAN INNOMINATE UNI L MOD SED  04/22/2017   IR ANGIO VERTEBRAL SEL SUBCLAVIAN INNOMINATE UNI R MOD SED  06/28/2017   IR ANGIO VERTEBRAL SEL SUBCLAVIAN INNOMINATE UNI R MOD SED  11/22/2017   IR ANGIO VERTEBRAL SEL SUBCLAVIAN INNOMINATE UNI R MOD SED  05/13/2018   IR ANGIO VERTEBRAL SEL VERTEBRAL UNI R MOD SED  04/22/2017   IR ANGIOGRAM FOLLOW UP STUDY  05/10/2017   IR  ANGIOGRAM FOLLOW UP STUDY  06/28/2017   IR ANGIOGRAM FOLLOW UP STUDY  12/27/2017   IR NEURO EACH ADD'L  AFTER BASIC UNI LEFT (MS)  12/27/2017   IR NEURO EACH ADD'L AFTER BASIC UNI RIGHT (MS)  05/10/2017   IR NEURO EACH ADD'L AFTER BASIC UNI RIGHT (MS)  06/28/2017   IR RADIOLOGIST EVAL & MGMT  04/30/2017   IR RADIOLOGIST EVAL & MGMT  06/15/2017   IR RADIOLOGIST EVAL & MGMT  07/13/2017   IR RADIOLOGIST EVAL & MGMT  01/11/2018   IR TRANSCATH/EMBOLIZ  05/10/2017   IR TRANSCATH/EMBOLIZ  06/28/2017   IR TRANSCATH/EMBOLIZ  12/27/2017   MULTIPLE TOOTH EXTRACTIONS     POLYPECTOMY  04/08/2018   Procedure: POLYPECTOMY;  Surgeon: Jeani Hawking, MD;  [facility]: [facility];  Service: Endoscopy;;   RADIOLOGY WITH ANESTHESIA N/A 08/08/2014   Procedure: RADIOLOGY WITH ANESTHESIA EMBOLIZATION;  Surgeon: Medication Radiologist, MD;  [facility]: [facility];  Service: Radiology;  Laterality: N/A;   RADIOLOGY WITH ANESTHESIA N/A 11/21/2014   Procedure: RADIOLOGY WITH ANESTHESIA;  Surgeon: Oneal Grout, MD;  [facility]: [facility];  Service: Radiology;  Laterality: N/A;   RADIOLOGY WITH ANESTHESIA N/A 02/27/2015   Procedure: RADIOLOGY WITH ANESTHESIA;  Surgeon: [name], MD;  [facility];  Service: Radiology;  Laterality: N/A;   RADIOLOGY WITH ANESTHESIA N/A 09/25/2015   Procedure: RADIOLOGY WITH ANESTHESIA;  Surgeon: [name], MD;  [facility];  Service: Radiology;  Laterality: N/A;   RADIOLOGY WITH ANESTHESIA N/A 05/10/2017   Procedure: EMBOLIZATION;  Surgeon: [name], MD;  [facility];  Service: Radiology;  Laterality: N/A;   RADIOLOGY WITH ANESTHESIA N/A 06/28/2017   Procedure: RADIOLOGY WITH ANESTHESIA-EMBOLIZATION;  Surgeon: [name], MD;  [facility];  Service: Radiology;  Laterality: N/A;   RADIOLOGY WITH ANESTHESIA N/A 08/26/2017   Procedure: RADIOLOGY WITH ANESTHESIA EMBOLIZATION;  Surgeon: [name], MD;  [facility];  Service: Radiology;  Laterality: N/A;    RADIOLOGY WITH ANESTHESIA N/A 11/22/2017   Procedure: EMBOLIZATION;  Surgeon: [name], MD;  [facility];  Service: Radiology;  Laterality: N/A;   RADIOLOGY WITH ANESTHESIA N/A 12/27/2017   Procedure: EMBOLIZATION;  Surgeon: [name], MD;  [facility]: [facility];  Service: Radiology;  Laterality: N/A;   RECTAL SURGERY  1980s   "tore it lifting heavy furniture; sewed it back together"   SHOULDER ARTHROSCOPY WITH OPEN ROTATOR CUFF REPAIR Right 2016   SHOULDER ARTHROSCOPY WITH ROTATOR CUFF REPAIR Left 1996   SHOULDER OPEN ROTATOR CUFF REPAIR Left 1997   "took out 8inches of my collarbone"   TEE WITHOUT CARDIOVERSION N/A 12/18/2016   Procedure: TRANSESOPHAGEAL ECHOCARDIOGRAM (TEE);  Surgeon: Orpah Cobb, MD;  Location: [facility] ENDOSCOPY;  Service: Cardiovascular;  Laterality: N/A;   TRACHEOSTOMY  1964   at age 4 months old; for asthma   TRACHEOSTOMY CLOSURE     VENTRAL HERNIA REPAIR N/A 09/24/2020   Procedure: LAPAROSCOPIC VENTRAL WALL HERNIA REPAIR WITH MESH;  Surgeon: Karie Soda, MD;  Location: [facility] ORS;  Service: General;  Laterality: N/A;    Social History   Socioeconomic History   Marital status: Divorced    Spouse name: not together since 2007   Number of children: 3   Years of education: 10   Highest education level: Not on file  Occupational History   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   Tobacco Use   Smoking status: Former    Packs/day: 1.50    Years: 40.00    Total pack years: 60.00    Types: Cigarettes    Start date: 01/15/1975  Quit date: 06/02/2014    Years since quitting: 8.5   Smokeless tobacco: Never  Vaping Use   Vaping Use: Former  Substance and Sexual Activity   Alcohol use: Yes    Alcohol/week: 3.0 standard drinks of alcohol    Types: 3 Cans of beer per week    Comment: drinks 3 - 12oz bottles every monday, during shooting pool   Drug use: No   Sexual activity: Never    Partners: Female    Comment:  partner has had surgery to prevent pregnancy  Other Topics Concern   Not on file  Social History Narrative   Lives with his son and his mother.  His son has no contact with the son's mother, though the patient is not legally separated from her.  She has a history of drug use and has been in prison several times.   Social Determinants of Health   Financial Resource Strain: Not on file  Food Insecurity: Not on file  Transportation Needs: Not on file  Physical Activity: Not on file  Stress: Not on file  Social Connections: Not on file  Intimate Partner Violence: Not on file    Family History  Problem Relation Age of Onset   Heart disease Mother        s/p 3V CABG   Cancer Father 107       lung cancer; +tobacco   Stroke Father    Cancer Maternal Grandmother    Heart disease Maternal Grandmother    Cancer Paternal Grandmother    Lupus Sister    Multiple sclerosis Sister    Anemia Daughter     Current Outpatient Medications  Medication Sig Dispense Refill   albuterol (PROVENTIL HFA;VENTOLIN HFA) 108 (90 BASE) MCG/ACT inhaler Inhale 2 puffs into the lungs every 6 (six) hours as needed for wheezing or shortness of breath.      amiodarone (PACERONE) 200 MG tablet Take 100 mg by mouth daily.     apixaban (ELIQUIS) 5 MG TABS tablet Take 1 tablet (5 mg total) by mouth 2 (two) times daily. 60 tablet    atorvastatin (LIPITOR) 40 MG tablet Take 40 mg by mouth daily.     Budeson-Glycopyrrol-Formoterol (BREZTRI AEROSPHERE) 160-9-4.8 MCG/ACT AERO Inhale 2 puffs into the lungs in the morning and at bedtime. 1 each 5   clopidogrel (PLAVIX) 75 MG tablet Take 0.5 tablets (37.5 mg total) by mouth daily.     KLOR-CON M10 10 MEQ tablet Take 10 mEq by mouth daily.     metFORMIN (GLUCOPHAGE) 500 MG tablet Take 1 tablet (500 mg total) by mouth 2 (two) times daily with a meal. 60 tablet 0   nortriptyline (PAMELOR) 10 MG capsule Take 10 mg by mouth at bedtime.     pantoprazole (PROTONIX) 40 MG tablet  Take 1 tablet (40 mg total) by mouth daily as needed (for heartburn). 30 tablet 3   phentermine 30 MG capsule Take 30 mg by mouth daily.     valACYclovir (VALTREX) 1000 MG tablet Take 1,000 mg by mouth 3 (three) times daily.     No current facility-administered medications for this visit.    Allergies  Allergen Reactions   Tylox [Oxycodone-Acetaminophen] Hives, Other (See Comments) and Rash    Sweating, shaking Reaction: Tremors and diaphoresis    Gabapentin     Gained weight and retained fluid    Adhesive [Tape] Rash    States IV tape is fine    REVIEW OF SYSTEMS:   [X]  denotes  positive finding, [ ]  denotes negative finding Cardiac  Comments:  Chest pain or chest pressure:    Shortness of breath upon exertion:    Short of breath when lying flat:    Irregular heart rhythm:        Vascular    Pain in calf, thigh, or hip brought on by ambulation: x   Pain in feet at night that wakes you up from your sleep:  x Tingling in feet  Blood clot in your veins:    Leg swelling:  x       Pulmonary    Oxygen at home:    Productive cough:  x   Wheezing:  x COPD      Neurologic    Sudden weakness in arms or legs:     Sudden numbness in arms or legs:     Sudden onset of difficulty speaking or slurred speech:    Temporary loss of vision in one eye:     Problems with dizziness:         Gastrointestinal    Blood in stool:     Vomited blood:         Genitourinary    Burning when urinating:     Blood in urine:        Psychiatric    Major depression:         Hematologic    Bleeding problems:    Problems with blood clotting too easily:        Skin    Rashes or ulcers:        Constitutional    Fever or chills:      PHYSICAL EXAMINATION:  Today's Vitals   12/17/22 1033  BP: 120/67  Pulse: (!) 59  Resp: 20  Temp: (!) 97.5 F (36.4 C)  TempSrc: Temporal  SpO2: 96%  Weight: 264 lb 8 oz (120 kg)  Height: 5' 7.5" (1.715 m)  PainSc: 10-Worst pain ever   Body mass  index is 40.82 kg/m.   General:  WDWN in NAD; vital signs documented above Gait: Not observed HENT: WNL, normocephalic Pulmonary: normal non-labored breathing without wheezing Cardiac: regular HR; without carotid bruits Abdomen: soft, NT, aortic pulse is not palpable Skin: without rashes Vascular Exam/Pulses:  Right Left  Radial 2+ (normal) 2+ (normal)  DP 2+ (normal) 2+ (normal)  PT 2+ (normal) 1+ (weak)   Extremities: BLE swelling with hemosiderin staining present.  Superficial varicosities visible both legs.   Neurologic: A&O X 3;  moving all extremities equally Psychiatric:  The pt has Normal affect.   Non-Invasive Vascular Imaging:   Venous duplex on 12/14/2022: Summary:  BILATERAL:  - No evidence of deep vein thrombosis seen in the lower extremities,  bilaterally.  - No evidence of superficial venous thrombosis in the lower extremities,  bilaterally.     [name] is a 62 y.o. male who presents with: BLE swelling and calf cramping    Leg swelling/cramping -pt has easily palpable pedal pulses bilaterally and feel his calf cramping is not related to arterial issues.  -pt does not have evidence of DVT.   -discussed with pt about wearing knee high 15-20 or 20-30 mmHg compression stockings and pt was measured for these today.    -discussed the importance of leg elevation and how to elevate properly - pt is advised to elevate their legs and a diagram is given to them to demonstrate for pt to lay flat on their back with knees elevated and  slightly bent with their feet higher than their knees, which puts their feet higher than their heart for 15 minutes per day.  If pt cannot lay flat, advised to lay as flat as possible.  -pt is advised to continue as much walking as possible and avoid sitting or standing for long periods of time.  -discussed importance of weight loss and exercise and that water aerobics would also be beneficial.  -handout with recommendations given -pt  will f/u as needed.  He knows that if he has worsening swelling or it becomes more bothersome, we can see him back with a formal venous duplex study.  He is in agreement with this plan.  Family hx of AAA with grandfather -discussed with pt that this can have a familial component and that he should be screened and Medicare does pay for a screening at age 19.  His GF was in his 80's.  Carotid disease and hx of carotid stenting -followed by Dr. Laury Deep, [facility] [phone]  Clinic MD:  Edilia Bo

## 2022-12-17 ENCOUNTER — Ambulatory Visit (INDEPENDENT_AMBULATORY_CARE_PROVIDER_SITE_OTHER): Payer: Medicare HMO | Admitting: Physician Assistant

## 2022-12-17 VITALS — BP 120/67 | HR 59 | Temp 97.5°F | Resp 20 | Ht 67.5 in | Wt 264.5 lb

## 2022-12-17 DIAGNOSIS — R1012 Left upper quadrant pain: Secondary | ICD-10-CM | POA: Insufficient documentation

## 2022-12-17 DIAGNOSIS — M7989 Other specified soft tissue disorders: Secondary | ICD-10-CM

## 2022-12-17 DIAGNOSIS — R1032 Left lower quadrant pain: Secondary | ICD-10-CM | POA: Insufficient documentation

## 2022-12-17 DIAGNOSIS — Z8249 Family history of ischemic heart disease and other diseases of the circulatory system: Secondary | ICD-10-CM

## 2023-01-20 ENCOUNTER — Other Ambulatory Visit: Payer: Self-pay | Admitting: Physician Assistant

## 2023-01-20 DIAGNOSIS — R600 Localized edema: Secondary | ICD-10-CM

## 2023-01-27 ENCOUNTER — Ambulatory Visit
Admission: RE | Admit: 2023-01-27 | Discharge: 2023-01-27 | Disposition: A | Discharge status: Home / Self Care | Payer: Medicare HMO | Source: Ambulatory Visit | Attending: Physician Assistant | Admitting: Physician Assistant

## 2023-01-27 DIAGNOSIS — R600 Localized edema: Secondary | ICD-10-CM

## 2023-01-27 MED ORDER — IOPAMIDOL (ISOVUE-300) INJECTION 61%
100.0000 mL | Freq: Once | INTRAVENOUS | Status: AC | PRN
  Administered 2023-01-27: 100 mL via INTRAVENOUS

## 2023-02-03 ENCOUNTER — Encounter (HOSPITAL_BASED_OUTPATIENT_CLINIC_OR_DEPARTMENT_OTHER): Payer: Self-pay

## 2023-02-03 ENCOUNTER — Emergency Department (HOSPITAL_BASED_OUTPATIENT_CLINIC_OR_DEPARTMENT_OTHER)
Admission: EM | Admit: 2023-02-03 | Discharge: 2023-02-03 | Disposition: A | Discharge status: Home / Self Care | Payer: Medicare HMO | Attending: Emergency Medicine | Admitting: Emergency Medicine

## 2023-02-03 ENCOUNTER — Emergency Department (HOSPITAL_BASED_OUTPATIENT_CLINIC_OR_DEPARTMENT_OTHER): Payer: Medicare HMO

## 2023-02-03 ENCOUNTER — Other Ambulatory Visit: Payer: Self-pay

## 2023-02-03 DIAGNOSIS — R1032 Left lower quadrant pain: Secondary | ICD-10-CM | POA: Diagnosis present

## 2023-02-03 DIAGNOSIS — R110 Nausea: Secondary | ICD-10-CM | POA: Insufficient documentation

## 2023-02-03 DIAGNOSIS — Z7901 Long term (current) use of anticoagulants: Secondary | ICD-10-CM | POA: Diagnosis not present

## 2023-02-03 DIAGNOSIS — R109 Unspecified abdominal pain: Secondary | ICD-10-CM

## 2023-02-03 LAB — URINALYSIS, ROUTINE W REFLEX MICROSCOPIC
Bilirubin Urine: NEGATIVE
Glucose, UA: NEGATIVE mg/dL
Hgb urine dipstick: NEGATIVE
Ketones, ur: NEGATIVE mg/dL
Leukocytes,Ua: NEGATIVE
Nitrite: NEGATIVE
Specific Gravity, Urine: 1.031 — ABNORMAL HIGH (ref 1.005–1.030)
pH: 6 (ref 5.0–8.0)

## 2023-02-03 LAB — BASIC METABOLIC PANEL
Anion gap: 10 (ref 5–15)
BUN: 18 mg/dL (ref 8–23)
CO2: 25 mmol/L (ref 22–32)
Calcium: 9.2 mg/dL (ref 8.9–10.3)
Chloride: 102 mmol/L (ref 98–111)
Creatinine, Ser: 0.97 mg/dL (ref 0.61–1.24)
GFR, Estimated: >60 mL/min (ref >60)
Glucose, Bld: 131 mg/dL — ABNORMAL HIGH (ref 70–99)
Potassium: 3.5 mmol/L (ref 3.5–5.1)
Sodium: 137 mmol/L (ref 135–145)

## 2023-02-03 LAB — CBC
HCT: 40.3 % (ref 39.0–52.0)
Hemoglobin: 13.7 g/dL (ref 13.0–17.0)
MCH: 29.5 pg (ref 26.0–34.0)
MCHC: 34 g/dL (ref 30.0–36.0)
MCV: 86.7 fL (ref 80.0–100.0)
Platelets: 267 10*3/uL (ref 150–400)
RBC: 4.65 MIL/uL (ref 4.22–5.81)
RDW: 12.8 % (ref 11.5–15.5)
WBC: 7.3 10*3/uL (ref 4.0–10.5)
nRBC: 0 % (ref 0.0–0.2)

## 2023-02-03 NOTE — ED Notes (Signed)
Patient transported to CT 

## 2023-02-03 NOTE — ED Triage Notes (Signed)
Patient here POV from Home.  Endorses left Flank pain that began 3-4 Hours ago. No Dysuria. No N/V/D. No Fevers.  NAD Noted during Triage. A&Ox4. Gcs 15. Ambulatory.

## 2023-02-03 NOTE — ED Provider Notes (Signed)
[place] EMERGENCY DEPARTMENT AT [facility] Provider Note   CSN: [id number] Arrival date & time: 02/03/23  1957     History  Chief Complaint  Patient presents with   Flank Pain    [name] is a 62 y.o. male.  Patient is a 62 year old male who presents with pain in his left flank.  It started earlier today.  Is been pretty constant but waxes and wanes in intensity.  He has had some nausea but no vomiting.  No fevers.  No urinary symptoms.  Says is worse when he has a bowel movement.  Sometimes worse with movement.  He says when he gets bad, he has nausea and diaphoresis.       Home Medications Prior to Admission medications   Medication Sig Start Date End Date Taking? Authorizing Provider  albuterol (PROVENTIL HFA;VENTOLIN HFA) 108 (90 BASE) MCG/ACT inhaler Inhale 2 puffs into the lungs every 6 (six) hours as needed for wheezing or shortness of breath.     [provider]  amiodarone (PACERONE) 200 MG tablet Take 100 mg by mouth daily.    [provider]  apixaban (ELIQUIS) 5 MG TABS tablet Take 1 tablet (5 mg total) by mouth 2 (two) times daily. 10/19/20   Lewie Chamber, MD  atorvastatin (LIPITOR) 40 MG tablet Take 40 mg by mouth daily. 12/25/21   [provider]  Budeson-Glycopyrrol-Formoterol (BREZTRI AEROSPHERE) 160-9-4.8 MCG/ACT AERO Inhale 2 puffs into the lungs in the morning and at bedtime. 03/31/22   Charlott Holler, MD  clopidogrel (PLAVIX) 75 MG tablet Take 0.5 tablets (37.5 mg total) by mouth daily. 10/19/20   Lewie Chamber, MD  KLOR-CON M10 10 MEQ tablet Take 10 mEq by mouth daily. 08/22/20   [provider]  metFORMIN (GLUCOPHAGE) 500 MG tablet Take 1 tablet (500 mg total) by mouth 2 (two) times daily with a meal. 07/06/14   Calvert Cantor, MD  nortriptyline (PAMELOR) 10 MG capsule Take 10 mg by mouth at bedtime. 12/31/21   [provider]  pantoprazole (PROTONIX) 40 MG tablet Take 1 tablet (40 mg total) by mouth  daily as needed (for heartburn). 10/19/20   Lewie Chamber, MD  phentermine 30 MG capsule Take 30 mg by mouth daily. 09/24/22   [provider]  valACYclovir (VALTREX) 1000 MG tablet Take 1,000 mg by mouth 3 (three) times daily. 10/15/21   [provider]      Allergies    Tylox [oxycodone-acetaminophen], Gabapentin, and Adhesive [tape]    Review of Systems   Review of Systems  Constitutional:  Negative for chills, diaphoresis, fatigue and fever.  HENT:  Negative for congestion, rhinorrhea and sneezing.   Eyes: Negative.   Respiratory:  Negative for cough, chest tightness and shortness of breath.   Cardiovascular:  Negative for chest pain and leg swelling.  Gastrointestinal:  Positive for abdominal pain and nausea. Negative for blood in stool, diarrhea and vomiting.  Genitourinary:  Positive for flank pain. Negative for difficulty urinating, frequency and hematuria.  Musculoskeletal:  Negative for arthralgias and back pain.  Skin:  Negative for rash.  Neurological:  Negative for dizziness, speech difficulty, weakness, numbness and headaches.    Physical Exam Updated Vital Signs BP 139/88 (BP Location: Right Arm)   Pulse 74   Temp 98.1 F (36.7 C)   Resp (!) 22   Ht  (1.727 m)   Wt 119.3 kg   SpO2 97%   BMI 39.99 kg/m  Physical Exam Constitutional:  Appearance: He is well-developed.  HENT:     Head: Normocephalic and atraumatic.  Eyes:     Pupils: Pupils are equal, round, and reactive to light.  Cardiovascular:     Rate and Rhythm: Normal rate and regular rhythm.     Heart sounds: Normal heart sounds.  Pulmonary:     Effort: Pulmonary effort is normal. No respiratory distress.     Breath sounds: Normal breath sounds. No wheezing or rales.  Chest:     Chest wall: No tenderness.  Abdominal:     General: Bowel sounds are normal.     Palpations: Abdomen is soft.     Tenderness: There is abdominal tenderness (left flank). There is no guarding or  rebound.  Musculoskeletal:        General: Normal range of motion.     Cervical back: Normal range of motion and neck supple.     Comments: No tenderness to back/spine, pedal pulses are intact  Lymphadenopathy:     Cervical: No cervical adenopathy.  Skin:    General: Skin is warm and dry.     Findings: No rash.  Neurological:     Mental Status: He is alert and oriented to person, place, and time.     ED Results / Procedures / Treatments   Labs (all labs ordered are listed, but only abnormal results are displayed) Labs Reviewed  URINALYSIS, ROUTINE W REFLEX MICROSCOPIC - Abnormal; Notable for the following components:      Result Value   Specific Gravity, Urine 1.031 (*)    Protein, ur TRACE (*)    All other components within normal limits  BASIC METABOLIC PANEL - Abnormal; Notable for the following components:   Glucose, Bld 131 (*)    All other components within normal limits  CBC    EKG None  Radiology CT Renal Stone Study  Result Date: 02/03/2023 CLINICAL DATA:  Left flank pain, stone suspected. EXAM: CT ABDOMEN AND PELVIS WITHOUT CONTRAST TECHNIQUE: Multidetector CT imaging of the abdomen and pelvis was performed following the standard protocol without IV contrast. RADIATION DOSE REDUCTION: This exam was performed according to the departmental dose-optimization program which includes automated exposure control, adjustment of the mA and/or kV according to patient size and/or use of iterative reconstruction technique. COMPARISON:  05/06/2022. FINDINGS: Lower chest: A coronary artery calcification is noted. The lung bases are clear. Hepatobiliary: No focal liver abnormality is seen. No gallstones, gallbladder wall thickening, or biliary dilatation. Pancreas: Unremarkable. No pancreatic ductal dilatation or surrounding inflammatory changes. Spleen: Normal in size without focal abnormality. Adrenals/Urinary Tract: No adrenal nodule or mass. A horseshoe kidney is present. No renal  calculus or hydronephrosis is seen bilaterally. The bladder is unremarkable. Stomach/Bowel: Stomach is within normal limits. Appendix appears normal. No evidence of bowel wall thickening, distention, or inflammatory changes. No free air or pneumatosis. Vascular/Lymphatic: Aortic atherosclerosis. No enlarged abdominal or pelvic lymph nodes. Reproductive: Prostate is unremarkable. Other: No abdominopelvic ascites. Musculoskeletal: Mild degenerative changes in the thoracolumbar spine. 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 No acute osseous abnormality. IMPRESSION: 1. No acute intra-abdominal process. 2. Horseshoe kidney. No renal calculus or obstructive uropathy bilaterally. 3. Coronary artery calcifications and aortic atherosclerosis. Electronically Signed   By: Thornell Sartorius M.D.   On: 02/03/2023 22:20    Procedures Procedures    Medications Ordered in ED Medications - No data to display  ED Course/ Medical Decision Making/ A&P  Medical Decision Making Amount and/or Complexity of Data Reviewed Labs: ordered. Radiology: ordered.   Patient presents with pain in his left flank.  His urine does not look consistent with infection.  No blood in it.  His other labs are nonconcerning.  He had a CT scan that did not show any acute abnormality.  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  On reexam, he has tenderness in his left lower back going around to his left flank area.  He does say it is worse with movement so could be musculoskeletal in nature.  His aorta did not look dilated on the CT scan so doubt aortic dissection.  He has no radicular symptoms down his legs.  No neurologic deficits.  He denies any need for any pain medication.  He was discharged home in good condition.  He was encouraged to follow-up with his primary care provider.  Return precautions were given.  Final Clinical Impression(s) / ED Diagnoses Final diagnoses:  Flank pain    Rx / DC  Orders ED Discharge Orders     None         Rolan Bucco, MD 02/03/23 2234

## 2023-04-07 ENCOUNTER — Telehealth (HOSPITAL_COMMUNITY): Payer: Self-pay

## 2023-04-07 ENCOUNTER — Other Ambulatory Visit (HOSPITAL_COMMUNITY): Payer: Self-pay | Admitting: Interventional Radiology

## 2023-04-07 DIAGNOSIS — I671 Cerebral aneurysm, nonruptured: Secondary | ICD-10-CM

## 2023-04-07 NOTE — Telephone Encounter (Signed)
Called to schedule cta, no answer, left vm. AB 

## 2023-04-12 ENCOUNTER — Telehealth (HOSPITAL_COMMUNITY): Payer: Self-pay

## 2023-04-12 NOTE — Telephone Encounter (Signed)
Called to schedule cta. Pt is concerned bc he isn't supposed to be having that much contrast due to his kidneys. I will send a message to our PA and call him back. AB

## 2023-04-28 ENCOUNTER — Ambulatory Visit (HOSPITAL_COMMUNITY)
Admission: RE | Admit: 2023-04-28 | Discharge: 2023-04-28 | Disposition: A | Discharge status: Home / Self Care | Payer: Medicare HMO | Source: Ambulatory Visit | Attending: Interventional Radiology | Admitting: Interventional Radiology

## 2023-04-28 DIAGNOSIS — I671 Cerebral aneurysm, nonruptured: Secondary | ICD-10-CM | POA: Diagnosis not present

## 2023-04-28 LAB — POCT I-STAT CREATININE: Creatinine, Ser: 1.1 mg/dL (ref 0.61–1.24)

## 2023-04-28 MED ORDER — IOHEXOL 350 MG/ML SOLN
75.0000 mL | Freq: Once | INTRAVENOUS | Status: AC | PRN
  Administered 2023-04-28: 75 mL via INTRAVENOUS

## 2023-05-03 ENCOUNTER — Telehealth (HOSPITAL_COMMUNITY): Payer: Self-pay

## 2023-05-03 NOTE — Telephone Encounter (Signed)
Pt agreed to f/u in 1 year with a cta head and neck. AB

## 2023-05-29 ENCOUNTER — Emergency Department (HOSPITAL_COMMUNITY): Payer: Medicare HMO

## 2023-05-29 ENCOUNTER — Emergency Department (HOSPITAL_COMMUNITY)
Admission: EM | Admit: 2023-05-29 | Discharge: 2023-05-30 | Disposition: A | Discharge status: Home / Self Care | Payer: Medicare HMO | Attending: Emergency Medicine | Admitting: Emergency Medicine

## 2023-05-29 ENCOUNTER — Encounter (HOSPITAL_COMMUNITY): Payer: Self-pay

## 2023-05-29 DIAGNOSIS — J449 Chronic obstructive pulmonary disease, unspecified: Secondary | ICD-10-CM | POA: Insufficient documentation

## 2023-05-29 DIAGNOSIS — Z7984 Long term (current) use of oral hypoglycemic drugs: Secondary | ICD-10-CM | POA: Insufficient documentation

## 2023-05-29 DIAGNOSIS — Z7901 Long term (current) use of anticoagulants: Secondary | ICD-10-CM | POA: Diagnosis not present

## 2023-05-29 DIAGNOSIS — R600 Localized edema: Secondary | ICD-10-CM | POA: Diagnosis present

## 2023-05-29 DIAGNOSIS — Z79899 Other long term (current) drug therapy: Secondary | ICD-10-CM | POA: Insufficient documentation

## 2023-05-29 DIAGNOSIS — N189 Chronic kidney disease, unspecified: Secondary | ICD-10-CM | POA: Diagnosis not present

## 2023-05-29 DIAGNOSIS — J45909 Unspecified asthma, uncomplicated: Secondary | ICD-10-CM | POA: Diagnosis not present

## 2023-05-29 DIAGNOSIS — E1122 Type 2 diabetes mellitus with diabetic chronic kidney disease: Secondary | ICD-10-CM | POA: Insufficient documentation

## 2023-05-29 DIAGNOSIS — E876 Hypokalemia: Secondary | ICD-10-CM | POA: Diagnosis not present

## 2023-05-29 DIAGNOSIS — Z7902 Long term (current) use of antithrombotics/antiplatelets: Secondary | ICD-10-CM | POA: Diagnosis not present

## 2023-05-29 DIAGNOSIS — I129 Hypertensive chronic kidney disease with stage 1 through stage 4 chronic kidney disease, or unspecified chronic kidney disease: Secondary | ICD-10-CM | POA: Insufficient documentation

## 2023-05-29 NOTE — ED Triage Notes (Addendum)
Pt arrived via POV, states he needs follow up CT. Had CT to look at chest prior and needs further imaging. States bilateral leg swelling and SOB

## 2023-05-30 LAB — BRAIN NATRIURETIC PEPTIDE: B Natriuretic Peptide: 18.8 pg/mL (ref 0.0–100.0)

## 2023-05-30 LAB — CBC WITH DIFFERENTIAL/PLATELET
Abs Immature Granulocytes: 0.01 10*3/uL (ref 0.00–0.07)
Basophils Absolute: 0 10*3/uL (ref 0.0–0.1)
Basophils Relative: 1 %
Eosinophils Absolute: 0.1 10*3/uL (ref 0.0–0.5)
Eosinophils Relative: 3 %
HCT: 37.4 % — ABNORMAL LOW (ref 39.0–52.0)
Hemoglobin: 12.2 g/dL — ABNORMAL LOW (ref 13.0–17.0)
Immature Granulocytes: 0 %
Lymphocytes Relative: 36 %
Lymphs Abs: 2 10*3/uL (ref 0.7–4.0)
MCH: 29.4 pg (ref 26.0–34.0)
MCHC: 32.6 g/dL (ref 30.0–36.0)
MCV: 90.1 fL (ref 80.0–100.0)
Monocytes Absolute: 0.8 10*3/uL (ref 0.1–1.0)
Monocytes Relative: 15 %
Neutro Abs: 2.6 10*3/uL (ref 1.7–7.7)
Neutrophils Relative %: 45 %
Platelets: 244 10*3/uL (ref 150–400)
RBC: 4.15 MIL/uL — ABNORMAL LOW (ref 4.22–5.81)
RDW: 13 % (ref 11.5–15.5)
WBC: 5.6 10*3/uL (ref 4.0–10.5)
nRBC: 0 % (ref 0.0–0.2)

## 2023-05-30 LAB — COMPREHENSIVE METABOLIC PANEL
ALT: 23 U/L (ref 0–44)
AST: 23 U/L (ref 15–41)
Albumin: 3.9 g/dL (ref 3.5–5.0)
Alkaline Phosphatase: 72 U/L (ref 38–126)
Anion gap: 7 (ref 5–15)
BUN: 12 mg/dL (ref 8–23)
CO2: 29 mmol/L (ref 22–32)
Calcium: 8.7 mg/dL — ABNORMAL LOW (ref 8.9–10.3)
Chloride: 99 mmol/L (ref 98–111)
Creatinine, Ser: 0.96 mg/dL (ref 0.61–1.24)
GFR, Estimated: >60 mL/min (ref >60)
Glucose, Bld: 108 mg/dL — ABNORMAL HIGH (ref 70–99)
Potassium: 2.9 mmol/L — ABNORMAL LOW (ref 3.5–5.1)
Sodium: 135 mmol/L (ref 135–145)
Total Bilirubin: 0.8 mg/dL (ref 0.3–1.2)
Total Protein: 6.6 g/dL (ref 6.5–8.1)

## 2023-05-30 LAB — TROPONIN I (HIGH SENSITIVITY): Troponin I (High Sensitivity): 5 ng/L (ref <18)

## 2023-05-30 MED ORDER — POTASSIUM CHLORIDE CRYS ER 20 MEQ PO TBCR
40.0000 meq | EXTENDED_RELEASE_TABLET | Freq: Once | ORAL | Status: AC
  Administered 2023-05-30: 40 meq via ORAL
  Filled 2023-05-30: qty 2

## 2023-05-30 MED ORDER — POTASSIUM CHLORIDE ER 10 MEQ PO TBCR: 20.0000 meq | EXTENDED_RELEASE_TABLET | Freq: Every day | ORAL | 0 refills | Status: AC

## 2023-05-30 MED ORDER — POTASSIUM CHLORIDE 10 MEQ/100ML IV SOLN
10.0000 meq | Freq: Once | INTRAVENOUS | Status: AC
  Administered 2023-05-30: 10 meq via INTRAVENOUS
  Filled 2023-05-30: qty 100

## 2023-05-30 NOTE — ED Provider Notes (Signed)
[place] EMERGENCY DEPARTMENT AT [facility] Provider Note  CSN: [id number] Arrival date & time: 05/29/23 1549  Chief Complaint(s) Leg Swelling  HPI [name] is a 62 y.o. male with a past medical history listed below who presents to the emergency department with bilateral lower extremity edema.  Edema is chronic but noted worse today.  He is endorsing stable dyspnea on exertion.  Reported earlier today noted he had some palpitations with pinprick chest discomfort while at work in mid afternoon.  These pinprick chest discomfort were very brief and self resolving.  They were nonexertional.  No recent fevers or infections.  No coughing or congestion.  Patient is compliant with his anticoagulation.  HPI  Past Medical History Past Medical History:  Diagnosis Date   Aneurysm (HCC)    brain   Arthritis    Atrial fibrillation (HCC)    Bipolar disorder (HCC)    was on meds but was taken off 2 yrs ago and none since   Burning pain    in both legs-seeing Dr.Sethi for this   CAD (coronary artery disease)    Cataracts, bilateral    Childhood asthma    Last asthma attack at age 37; History of trach at 16 months (12/17/2016)   Chronic kidney disease 2009   Chronic lower back pain    COPD (chronic obstructive pulmonary disease) (HCC) 06/2012   uses Spiriva and Albuterol daily as needed   Depression    Family history of adverse reaction to anesthesia    "Mom and sister have PONV"   GERD (gastroesophageal reflux disease)    H/O hiatal hernia    Headache(784.0)    "q 2-3 weeks; daily last 2 wks" (12/17/2016)   Hyperlipidemia    takes Ramipril daily   Hypertension    takes Ramipril daily   Joint pain    Joint swelling    Middle cerebral artery aneurysm    right   Nocturia    Numbness    both arms    Pneumonia    hx of-2014   PONV (postoperative nausea and vomiting)    Pt reports nausea only.   Sleep apnea    dx just this past week   08/21/2017   Stroke [facility]) 2015    "drag left foot more since; have to wear glasses now" (12/17/2016)   Type II diabetes mellitus (HCC)    takes Metformin daily   dx 2015   Umbilical hernia    Urinary frequency    Wears dentures    Wears glasses    and contact lenses   Patient Active Problem List   Diagnosis Date Noted   Left lower quadrant pain 12/17/2022   Left upper quadrant pain 12/17/2022   Degenerative TFCC tear, right 11/30/2022   Ulnar impaction syndrome, right 11/30/2022   On continuous oral anticoagulation 11/24/2022   Lung nodule 07/01/2022   AKI (acute kidney injury) (HCC) 01/02/2022   Essential hypertension 01/02/2022   Diarrhea 01/02/2022   Angiodysplasia of colon with hemorrhage 01/28/2021   History of CVA (cerebrovascular accident) 10/19/2020   GI bleeding 10/18/2020   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   Nocturnal hypoxemia due to obstructive chronic bronchitis 12/15/2018   OSA and COPD overlap syndrome (HCC) 12/13/2017   OSA on CPAP 12/13/2017   COPD (chronic obstructive pulmonary disease) with chronic bronchitis 06/22/2017   Class 2 obesity with alveolar hypoventilation, serious comorbidity, and body mass index (BMI) of 38.0 to 38.9 in adult (HCC) 06/22/2017   Cerebral  embolism with transient ischemic attack (TIA) 06/22/2017   Postoperative hypoxemia 06/22/2017   Nocturia more than twice per night 06/22/2017   Dermatitis 06/07/2017   Abnormal pulse oximetry 05/17/2017   Aneurysm (HCC) 05/11/2017   Folliculitis 01/28/2017   PAF (paroxysmal atrial fibrillation) (HCC) 12/17/2016   History of atrial fibrillation 12/17/2016   Epistaxis 10/26/2016   Lumbar strain 10/09/2016   Encounter for general adult medical examination without abnormal findings 12/23/2015   Carotid stenosis, symptomatic w/o infarct 09/25/2015   Venous insufficiency 06/18/2015   Paresthesia and pain of right extremity 12/04/2014   Carotid stenosis 12/04/2014   Prediabetes 10/01/2014   Stroke, hemorrhagic (HCC) 09/01/2014    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) 08/31/2014   TIA (transient ischemic attack) 08/14/2014   Right arm weakness    Cerebral hemorrhage (HCC) 08/11/2014   ICH (intracerebral hemorrhage) (HCC) 08/11/2014   Brain aneurysm 08/08/2014   Chest pain at rest 07/08/2014   right ICAO (internal carotid artery occlusion) 07/06/2014   DM type 2 causing vascular disease (HCC) 07/06/2014   Hyperlipidemia LDL goal <100 07/06/2014   Morbid obesity (HCC) 07/06/2014   Acute CVA (cerebrovascular accident) (HCC) 07/03/2014   Asthma 01/13/2013   GERD (gastroesophageal reflux disease) 10/19/2012   Backache 10/14/2012   COPD (chronic obstructive pulmonary disease) (HCC) 06/12/2012   Hernia of abdominal cavity 05/12/2012   Peripheral edema 05/12/2012   Home Medication(s) Prior to Admission medications   Medication Sig Start Date End Date Taking? Authorizing Provider  albuterol (PROVENTIL HFA;VENTOLIN HFA) 108 (90 BASE) MCG/ACT inhaler Inhale 2 puffs into the lungs every 6 (six) hours as needed for wheezing or shortness of breath.    Yes [provider]  amiodarone (PACERONE) 100 MG tablet Take 100 mg by mouth daily.   Yes [provider]  apixaban (ELIQUIS) 5 MG TABS tablet Take 1 tablet (5 mg total) by mouth 2 (two) times daily. 10/19/20  Yes Lewie Chamber, MD  atorvastatin (LIPITOR) 40 MG tablet Take 40 mg by mouth daily. 12/25/21  Yes [provider]  Budeson-Glycopyrrol-Formoterol (BREZTRI AEROSPHERE) 160-9-4.8 MCG/ACT AERO Inhale 2 puffs into the lungs in the morning and at bedtime. 03/31/22  Yes Charlott Holler, MD  clopidogrel (PLAVIX) 75 MG tablet Take 0.5 tablets (37.5 mg total) by mouth daily. Patient taking differently: Take 37.5 mg by mouth in the morning and at bedtime. 10/19/20  Yes Lewie Chamber, MD  esomeprazole (NEXIUM) 40 MG capsule Take 40 mg by mouth daily. 03/09/23  Yes [provider]  furosemide (LASIX) 40 MG tablet Take 40 mg  by mouth 2 (two) times daily. 03/18/23  Yes [provider]  KLOR-CON M10 10 MEQ tablet Take 10 mEq by mouth daily. 08/22/20  Yes [provider]  nitroGLYCERIN (NITROSTAT) 0.4 MG SL tablet Place 0.4 mg under the tongue every 5 (five) minutes as needed for chest pain. 03/03/23  Yes [provider]  phentermine 30 MG capsule Take 30 mg by mouth daily. 09/24/22  Yes [provider]  potassium chloride (KLOR-CON) 10 MEQ tablet Take 2 tablets (20 mEq total) by mouth daily. 05/30/23  Yes Parthena Fergeson, Amadeo Garnet, MD  ramipril (ALTACE) 10 MG capsule Take 10 mg by mouth daily.   Yes [provider]  valACYclovir (VALTREX) 1000 MG tablet Take 1,000 mg by mouth 3 (three) times daily as needed (outbreak). 10/15/21  Yes [provider]  metFORMIN (GLUCOPHAGE) 500 MG tablet Take 1 tablet (500 mg total) by mouth 2 (  two) times daily with a meal. Patient not taking: Reported on 05/30/2023 07/06/14   Calvert Cantor, MD  pantoprazole (PROTONIX) 40 MG tablet Take 1 tablet (40 mg total) by mouth daily as needed (for heartburn). Patient not taking: Reported on 05/30/2023 10/19/20   Lewie Chamber, MD                                                                                                                                    Allergies Tylox [oxycodone-acetaminophen], Gabapentin, and Adhesive [tape]  Review of Systems Review of Systems As noted in HPI  Physical Exam Vital Signs  I have reviewed the triage vital signs BP 118/85   Pulse (!) 55   Temp 97.8 F (36.6 C)   Resp 18   Ht 5\' 8"  (1.727 m)   Wt 120.2 kg   SpO2 96%   BMI 40.29 kg/m   Physical Exam Vitals reviewed.  Constitutional:      General: He is not in acute distress.    Appearance: He is well-developed. He is not diaphoretic.  HENT:     Head: Normocephalic and atraumatic.     Nose: Nose normal.  Eyes:     General: No scleral icterus.       Right eye: No discharge.        Left eye: No  discharge.     Conjunctiva/sclera: Conjunctivae normal.     Pupils: Pupils are equal, round, and reactive to light.  Cardiovascular:     Rate and Rhythm: Normal rate and regular rhythm.     Heart sounds: No murmur heard.    No friction rub. No gallop.  Pulmonary:     Effort: Pulmonary effort is normal. No respiratory distress.     Breath sounds: Normal breath sounds. No stridor. No rales.  Abdominal:     General: There is no distension.     Palpations: Abdomen is soft.     Tenderness: There is no abdominal tenderness.  Musculoskeletal:        General: No tenderness.     Cervical back: Normal range of motion and neck supple.     Right lower leg: 1+ Pitting Edema present.     Left lower leg: 1+ Pitting Edema present.  Skin:    General: Skin is warm and dry.     Findings: No erythema or rash.  Neurological:     Mental Status: He is alert and oriented to person, place, and time.     ED Results and Treatments Labs (all labs ordered are listed, but only abnormal results are displayed) Labs Reviewed  CBC WITH DIFFERENTIAL/PLATELET - Abnormal; Notable for the following components:      Result Value   RBC 4.15 (*)    Hemoglobin 12.2 (*)    HCT 37.4 (*)    All other components within normal limits  COMPREHENSIVE METABOLIC PANEL - Abnormal; Notable for the following components:  Potassium 2.9 (*)    Glucose, Bld 108 (*)    Calcium 8.7 (*)    All other components within normal limits  BRAIN NATRIURETIC PEPTIDE  TROPONIN I (HIGH SENSITIVITY)                                                                                                                         EKG  EKG Interpretation Date/Time:  Saturday May 29 2023 16:07:54 EDT Ventricular Rate:  65 PR Interval:  147 QRS Duration:  149 QT Interval:  478 QTC Calculation: 498 R Axis:   -50  Text Interpretation: Sinus rhythm RBBB and LAFB Confirmed by Drema Pry [id number]) on 05/30/2023 12:16:30 AM       Radiology DG  Chest 2 View  Result Date: 05/29/2023 CLINICAL DATA:  Shortness of breath EXAM: CHEST - 2 VIEW COMPARISON:  01/02/2022 FINDINGS: Stable cardiomediastinal silhouette. Aortic atherosclerotic calcification. No focal consolidation, pleural effusion, or pneumothorax. No displaced rib fractures. IMPRESSION: No active cardiopulmonary disease. Electronically Signed   By: Minerva Fester M.D.   On: 05/29/2023 19:01    Medications Ordered in ED Medications  potassium chloride 10 mEq in 100 mL IVPB (10 mEq Intravenous New Bag/Given 05/30/23 0231)  potassium chloride SA (KLOR-CON M) CR tablet 40 mEq (40 mEq Oral Given 05/30/23 0231)   Procedures Procedures  (including critical care time) Medical Decision Making / ED Course   Medical Decision Making Amount and/or Complexity of Data Reviewed Labs: ordered. Decision-making details documented in ED Course. Radiology: ordered and independent interpretation performed. Decision-making details documented in ED Course. ECG/medicine tests: ordered and independent interpretation performed. Decision-making details documented in ED Course.  Risk Prescription drug management.    BLE edema. Differential listed below  Chronic skin changes and varicose veins favor venous insufficiency.  Patient is anticoagulated and I have low suspicion for DVT.  No superimposed cellulitis.  Given patient's history of hypertension and cardiac history, will assess for heart failure.   CBC without leukocytosis.  No anemia.  Metabolic panel with hypokalemia (likely from diuretics).  No other significant electrolyte derangements or renal sufficiency.  No liver insufficiency.  BNP normal.  Troponin normal.  Chest x-ray without evidence of pneumonia, pneumothorax, pulmonary edema pleural effusions.  K+ repleted as above.    Final Clinical Impression(s) / ED Diagnoses Final diagnoses:  Peripheral edema  Hypokalemia   The patient appears reasonably screened and/or stabilized for  discharge and I doubt any other medical condition or other [facility] requiring further screening, evaluation, or treatment in the ED at this time. I have discussed the findings, Dx and Tx plan with the patient/family who expressed understanding and agree(s) with the plan. Discharge instructions discussed at length. The patient/family was given strict return precautions who verbalized understanding of the instructions. No further questions at time of discharge.  Disposition: Discharge  Condition: Good  ED Discharge Orders          Ordered    potassium chloride (KLOR-CON) 10 MEQ  tablet  Daily        05/30/23 0407            Follow Up: Tracey Harries, MD [address] [place] [address] [phone]  Call  to schedule an appointment for close follow up    This chart was dictated using voice recognition software.  Despite best efforts to proofread,  errors can occur which can change the documentation meaning.    Nira Conn, MD 05/30/23 [phone]

## 2023-06-02 ENCOUNTER — Other Ambulatory Visit (HOSPITAL_COMMUNITY)
Admission: AD | Admit: 2023-06-02 | Discharge: 2023-06-02 | Disposition: A | Discharge status: Home / Self Care | Payer: Medicare HMO | Source: Ambulatory Visit | Attending: Cardiology | Admitting: Cardiology

## 2023-06-02 DIAGNOSIS — E782 Mixed hyperlipidemia: Secondary | ICD-10-CM | POA: Diagnosis not present

## 2023-06-02 DIAGNOSIS — I10 Essential (primary) hypertension: Secondary | ICD-10-CM | POA: Diagnosis not present

## 2023-06-02 DIAGNOSIS — E119 Type 2 diabetes mellitus without complications: Secondary | ICD-10-CM | POA: Insufficient documentation

## 2023-06-02 LAB — BASIC METABOLIC PANEL
Anion gap: 11 (ref 5–15)
BUN: 10 mg/dL (ref 8–23)
CO2: 29 mmol/L (ref 22–32)
Calcium: 9.4 mg/dL (ref 8.9–10.3)
Chloride: 99 mmol/L (ref 98–111)
Creatinine, Ser: 0.86 mg/dL (ref 0.61–1.24)
GFR, Estimated: >60 mL/min (ref >60)
Glucose, Bld: 122 mg/dL — ABNORMAL HIGH (ref 70–99)
Potassium: 4.3 mmol/L (ref 3.5–5.1)
Sodium: 139 mmol/L (ref 135–145)

## 2023-06-02 LAB — HEPATIC FUNCTION PANEL
ALT: 24 U/L (ref 0–44)
AST: 21 U/L (ref 15–41)
Albumin: 3.7 g/dL (ref 3.5–5.0)
Alkaline Phosphatase: 72 U/L (ref 38–126)
Bilirubin, Direct: <0.1 mg/dL (ref 0.0–0.2)
Total Bilirubin: 0.4 mg/dL (ref 0.3–1.2)
Total Protein: 6.7 g/dL (ref 6.5–8.1)

## 2023-06-02 LAB — LIPID PANEL
Cholesterol: 131 mg/dL (ref 0–200)
HDL: 42 mg/dL (ref >40)
LDL Cholesterol: 77 mg/dL (ref 0–99)
Total CHOL/HDL Ratio: 3.1 RATIO
Triglycerides: 60 mg/dL (ref <150)
VLDL: 12 mg/dL (ref 0–40)

## 2023-06-02 LAB — TSH: TSH: 0.971 u[IU]/mL (ref 0.350–4.500)

## 2023-06-09 ENCOUNTER — Ambulatory Visit: Payer: Medicare HMO | Admitting: Pulmonary Disease

## 2023-07-15 ENCOUNTER — Ambulatory Visit: Payer: Medicare HMO | Admitting: Internal Medicine

## 2023-08-26 ENCOUNTER — Encounter: Payer: Self-pay | Admitting: Internal Medicine

## 2023-08-26 ENCOUNTER — Ambulatory Visit: Payer: Medicare HMO | Admitting: Internal Medicine

## 2023-08-26 VITALS — BP 134/78 | HR 60 | Ht 68.0 in | Wt 244.2 lb

## 2023-08-26 DIAGNOSIS — J439 Emphysema, unspecified: Secondary | ICD-10-CM | POA: Diagnosis not present

## 2023-08-26 DIAGNOSIS — Z122 Encounter for screening for malignant neoplasm of respiratory organs: Secondary | ICD-10-CM

## 2023-08-26 DIAGNOSIS — J4489 Other specified chronic obstructive pulmonary disease: Secondary | ICD-10-CM | POA: Diagnosis not present

## 2023-08-26 NOTE — Progress Notes (Signed)
[name]    [id number]    07-06-1961  Primary Care Physician:Bouska, Onalee Hua, MD Date of Appointment: 08/26/2023 Established Patient Visit  Chief complaint:   Chief Complaint  Patient presents with   Follow-up    COPD F/U visit.      HPI: [name] is a 62 y.o. man with COPD gold stage 0. Quit smoking 2015.   Interval Updates: Here for follow up.  Doing well on breztri 2 puffs twice daily. Minimal albuterol.  Independent with ADLs. Dyspnea does not limit daily life.   No interval bronchitis/pneumonia. He is still doing some odd and end remodeling jobs.   Daughter passed away last year under tragic circumstances.  He is otherwise doing well.    I have reviewed the patient's family social and past medical history and updated as appropriate.   Past Medical History:  Diagnosis Date   Aneurysm (HCC)    brain   Arthritis    Atrial fibrillation (HCC)    Bipolar disorder (HCC)    was on meds but was taken off 2 yrs ago and none since   Burning pain    in both legs-seeing Dr.Sethi for this   CAD (coronary artery disease)    Cataracts, bilateral    Childhood asthma    Last asthma attack at age 82; History of trach at 16 months (12/17/2016)   Chronic kidney disease 2009   Chronic lower back pain    COPD (chronic obstructive pulmonary disease) (HCC) 06/2012   uses Spiriva and Albuterol daily as needed   Depression    Family history of adverse reaction to anesthesia    "Mom and sister have PONV"   GERD (gastroesophageal reflux disease)    H/O hiatal hernia    Headache(784.0)    "q 2-3 weeks; daily last 2 wks" (12/17/2016)   Hyperlipidemia    takes Ramipril daily   Hypertension    takes Ramipril daily   Joint pain    Joint swelling    Middle cerebral artery aneurysm    right   Nocturia    Numbness    both arms    Pneumonia    hx of-2014   PONV (postoperative nausea and vomiting)    Pt reports nausea only.   Sleep apnea    dx just this past  week   08/21/2017   Stroke [facility]) 2015   "drag left foot more since; have to wear glasses now" (12/17/2016)   Type II diabetes mellitus (HCC)    takes Metformin daily   dx 2015   Umbilical hernia    Urinary frequency    Wears dentures    Wears glasses    and contact lenses    Past Surgical History:  Procedure Laterality Date   ANEURYSM COILING  2015   CARDIOVERSION N/A 12/18/2016   Procedure: CARDIOVERSION;  Surgeon: Orpah Cobb, MD;  Location: [facility] ENDOSCOPY;  Service: Cardiovascular;  Laterality: N/A;   COLONOSCOPY     COLONOSCOPY N/A 04/08/2018   Procedure: COLONOSCOPY;  Surgeon: Jeani Hawking, MD;  Location: [facility] ENDOSCOPY;  Service: Endoscopy;  Laterality: N/A;   COLONOSCOPY N/A 10/18/2020   Procedure: COLONOSCOPY;  Surgeon: Jeani Hawking, MD;  Location: [facility] ENDOSCOPY;  Service: Endoscopy;  Laterality: N/A;   ESOPHAGOGASTRODUODENOSCOPY N/A 04/08/2018   Procedure: ESOPHAGOGASTRODUODENOSCOPY (EGD);  Surgeon: Jeani Hawking, MD;  Location: [facility] ENDOSCOPY;  Service: Endoscopy;  Laterality: N/A;   EXCISIONAL HEMORRHOIDECTOMY  1980s   "soon after rectal  OR"   GANGLION CYST EXCISION Right    HEMOSTASIS CLIP PLACEMENT  10/18/2020   Procedure: HEMOSTASIS CLIP PLACEMENT;  Surgeon: Jeani Hawking, MD;  Location: [facility] ENDOSCOPY;  Service: Endoscopy;;   HOT HEMOSTASIS N/A 04/08/2018   Procedure: HOT HEMOSTASIS (ARGON PLASMA COAGULATION/BICAP);  Surgeon: Jeani Hawking, MD;  Location: [facility] ENDOSCOPY;  Service: Endoscopy;  Laterality: N/A;   HOT HEMOSTASIS N/A 10/18/2020   Procedure: HOT HEMOSTASIS (ARGON PLASMA COAGULATION/BICAP);  Surgeon: Jeani Hawking, MD;  Location: [facility] ENDOSCOPY;  Service: Endoscopy;  Laterality: N/A;   IR 3D INDEPENDENT WKST  05/10/2017   IR 3D INDEPENDENT WKST  06/28/2017   IR ANGIO INTRA EXTRACRAN SEL COM CAROTID INNOMINATE BILAT MOD SED  04/22/2017   IR ANGIO INTRA EXTRACRAN SEL COM CAROTID INNOMINATE BILAT MOD SED  11/22/2017   IR ANGIO INTRA EXTRACRAN SEL COM CAROTID INNOMINATE BILAT MOD SED   05/13/2018   IR ANGIO INTRA EXTRACRAN SEL INTERNAL CAROTID UNI L MOD SED  12/27/2017   IR ANGIO INTRA EXTRACRAN SEL INTERNAL CAROTID UNI R MOD SED  05/10/2017   IR ANGIO INTRA EXTRACRAN SEL INTERNAL CAROTID UNI R MOD SED  06/28/2017   IR ANGIO VERTEBRAL SEL SUBCLAVIAN INNOMINATE UNI L MOD SED  04/22/2017   IR ANGIO VERTEBRAL SEL SUBCLAVIAN INNOMINATE UNI R MOD SED  06/28/2017   IR ANGIO VERTEBRAL SEL SUBCLAVIAN INNOMINATE UNI R MOD SED  11/22/2017   IR ANGIO VERTEBRAL SEL SUBCLAVIAN INNOMINATE UNI R MOD SED  05/13/2018   IR ANGIO VERTEBRAL SEL VERTEBRAL UNI R MOD SED  04/22/2017   IR ANGIOGRAM FOLLOW UP STUDY  05/10/2017   IR ANGIOGRAM FOLLOW UP STUDY  06/28/2017   IR ANGIOGRAM FOLLOW UP STUDY  12/27/2017   IR NEURO EACH ADD'L AFTER BASIC UNI LEFT (MS)  12/27/2017   IR NEURO EACH ADD'L AFTER BASIC UNI RIGHT (MS)  05/10/2017   IR NEURO EACH ADD'L AFTER BASIC UNI RIGHT (MS)  06/28/2017   IR RADIOLOGIST EVAL & MGMT  04/30/2017   IR RADIOLOGIST EVAL & MGMT  06/15/2017   IR RADIOLOGIST EVAL & MGMT  07/13/2017   IR RADIOLOGIST EVAL & MGMT  01/11/2018   IR TRANSCATH/EMBOLIZ  05/10/2017   IR TRANSCATH/EMBOLIZ  06/28/2017   IR TRANSCATH/EMBOLIZ  12/27/2017   MULTIPLE TOOTH EXTRACTIONS     POLYPECTOMY  04/08/2018   Procedure: POLYPECTOMY;  Surgeon: Jeani Hawking, MD;  [facility]: [facility];  Service: Endoscopy;;   RADIOLOGY WITH ANESTHESIA N/A 08/08/2014   Procedure: RADIOLOGY WITH ANESTHESIA EMBOLIZATION;  Surgeon: Medication Radiologist, MD;  Location: [facility] OR;  Service: Radiology;  Laterality: N/A;   RADIOLOGY WITH ANESTHESIA N/A 11/21/2014   Procedure: RADIOLOGY WITH ANESTHESIA;  Surgeon: Oneal Grout, MD;  Location: [facility];  Service: Radiology;  Laterality: N/A;   RADIOLOGY WITH ANESTHESIA N/A 02/27/2015   Procedure: RADIOLOGY WITH ANESTHESIA;  Surgeon: [name], MD;  [facility]: [facility];  Service: Radiology;  Laterality: N/A;   RADIOLOGY WITH ANESTHESIA N/A 09/25/2015   Procedure: RADIOLOGY WITH ANESTHESIA;   Surgeon: [name], MD;  [facility]: [facility];  Service: Radiology;  Laterality: N/A;   RADIOLOGY WITH ANESTHESIA N/A 05/10/2017   Procedure: EMBOLIZATION;  Surgeon: [name], MD;  Location: [facility];  Service: Radiology;  Laterality: N/A;   RADIOLOGY WITH ANESTHESIA N/A 06/28/2017   Procedure: RADIOLOGY WITH ANESTHESIA-EMBOLIZATION;  Surgeon: [name], MD;  [facility]: [facility];  Service: Radiology;  Laterality: N/A;   RADIOLOGY WITH ANESTHESIA N/A 08/26/2017   Procedure: RADIOLOGY WITH ANESTHESIA EMBOLIZATION;  Surgeon:  [name], MD;  [facility]: [facility];  Service: Radiology;  Laterality: N/A;   RADIOLOGY WITH ANESTHESIA N/A 11/22/2017   Procedure: EMBOLIZATION;  Surgeon: [name], MD;  [facility];  Service: Radiology;  Laterality: N/A;   RADIOLOGY WITH ANESTHESIA N/A 12/27/2017   Procedure: EMBOLIZATION;  Surgeon: [name], MD;  Location: [facility] OR;  Service: Radiology;  Laterality: N/A;   RECTAL SURGERY  1980s   "tore it lifting heavy furniture; sewed it back together"   SHOULDER ARTHROSCOPY WITH OPEN ROTATOR CUFF REPAIR Right 2016   SHOULDER ARTHROSCOPY WITH ROTATOR CUFF REPAIR Left 1996   SHOULDER OPEN ROTATOR CUFF REPAIR Left 1997   "took out 8inches of my collarbone"   TEE WITHOUT CARDIOVERSION N/A 12/18/2016   Procedure: TRANSESOPHAGEAL ECHOCARDIOGRAM (TEE);  Surgeon: Orpah Cobb, MD;  Location: [facility] ENDOSCOPY;  Service: Cardiovascular;  Laterality: N/A;   TRACHEOSTOMY  1964   at age 30 months old; for asthma   TRACHEOSTOMY CLOSURE     VENTRAL HERNIA REPAIR N/A 09/24/2020   Procedure: LAPAROSCOPIC VENTRAL WALL HERNIA REPAIR WITH MESH;  Surgeon: Karie Soda, MD;  Location: [facility] ORS;  Service: General;  Laterality: N/A;    Family History  Problem Relation Age of Onset   Heart disease Mother        s/p 3V CABG   Cancer Father 2       lung cancer; +tobacco   Stroke Father    Cancer Maternal Grandmother    Heart disease Maternal Grandmother     Cancer Paternal Grandmother    Lupus Sister    Multiple sclerosis Sister    Anemia Daughter     Social History   Occupational History   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   Tobacco Use   Smoking status: Former    Current packs/day: 0.00    Average packs/day: 1.5 packs/day for 40.0 years (60.0 ttl pk-yrs)    Types: Cigarettes    Start date: 01/15/1975    Quit date: 06/02/2014    Years since quitting: 9.2    Passive exposure: Never   Smokeless tobacco: Never  Vaping Use   Vaping status: Former  Substance and Sexual Activity   Alcohol use: Yes    Alcohol/week: 3.0 standard drinks of alcohol    Types: 3 Cans of beer per week    Comment: drinks 3 - 12oz bottles every monday, during shooting pool   Drug use: No   Sexual activity: Never    Partners: Female    Comment: partner has had surgery to prevent pregnancy     Physical Exam: Blood pressure 134/78, pulse 60, height 5\' 8"  (1.727 m), weight 244 lb 3.2 oz (110.8 kg), SpO2 95%.  Gen:      No acute distress ENT:  no nasal polyps, mucus membranes moist Lungs:    No increased respiratory effort, symmetric chest wall excursion, clear to auscultation bilaterally, no wheezes or crackles CV:         Regular rate and rhythm; no murmurs, rubs, or gallops.  No pedal edema   Data Reviewed: Imaging: I have personally reviewed the CT angio head and neck in July 2024. Visualized lung windows show mild centrilobular emphysema  PFTs:     Latest Ref Rng & Units 07/01/2022   12:40 PM  PFT Results  FVC-Pre L 3.28   FVC-Predicted Pre % 75   FVC-Post L 3.37   FVC-Predicted Post % 77   Pre FEV1/FVC % %  71   Post FEV1/FCV % % 73   FEV1-Pre L 2.33   FEV1-Predicted Pre % 70   FEV1-Post L 2.48   DLCO uncorrected ml/min/mmHg 24.58   DLCO UNC% % 96   DLCO corrected ml/min/mmHg 24.58   DLCO COR %Predicted % 96   DLVA Predicted % 103   TLC L 6.15   TLC % Predicted % 94   RV % Predicted % 122     I have personally reviewed the patient's PFTs and normal pulmonary function.   Labs:  Immunization status: Immunization History  Administered Date(s) Administered   Influenza Inj Mdck Quad Pf 07/18/2021, 06/16/2022   Influenza,inj,Quad PF,6+ Mos 07/05/2014, 08/05/2015, 06/12/2016, 06/29/2017   Influenza-Unspecified 07/05/2014, 08/05/2015, 06/12/2016, 06/29/2017, 06/28/2018, 07/18/2019, 08/16/2020   PFIZER(Purple Top)SARS-COV-2 Vaccination 11/27/2019, 12/19/2019, 08/16/2020   PNEUMOCOCCAL CONJUGATE-20 11/19/2021   Pfizer Covid-19 Vaccine Bivalent Booster 91yrs & up 08/13/2021   Pneumococcal Polysaccharide-23 07/05/2014, 06/29/2017, 11/07/2018   Tdap 11/21/2010, 05/11/2012, 06/16/2022    External Records Personally Reviewed: [facility] scans  Assessment:  COPD gold stage 0  History of tobacco use disorder, quit 2015 Need for lung cancer screening required through 2030.   Plan/Recommendations: See me AFTER your CT scan in July - call us to make an appointment. (Scan done at [facility])  Continue Breztri 2 puffs twice daily with albuterol as needed.   Call me sooner if issues or concerns with your breathing.   You have a few nodules on your lungs that are tiny and stable - probably not cancer.    Return to Care: Return in about 8 months (around 04/24/2024).   Durel Salts, MD Pulmonary and Critical Care Medicine [facility] [phone]

## 2023-08-26 NOTE — Patient Instructions (Addendum)
It was a pleasure to see you today!  Please schedule follow up scheduled with myself in 8 months.  If my schedule is not open yet, we will contact you with a reminder closer to that time. Please call [phone] if you haven't heard from [place] a month before, and always call us sooner if issues or concerns arise. You can also send [place] a message through MyChart, but but aware that this is not to be used for urgent issues and it may take up to 5-7 days to receive a reply. Please be aware that you will likely be able to view your results before I have a chance to respond to them. Please give [place] 5 business days to respond to any non-urgent results.    Before your next visit I would like you to have: See me AFTER your CT scan in July - call us to make an appointment.    Continue Breztri 2 puffs twice daily with albuterol as needed.   Call me sooner if issues or concerns with your breathing.   You have a few nodules on your lungs that are tiny and stable - probably not cancer.

## 2023-11-30 ENCOUNTER — Ambulatory Visit: Payer: Medicare Other | Admitting: Dermatology

## 2023-11-30 ENCOUNTER — Encounter: Payer: Self-pay | Admitting: Dermatology

## 2023-11-30 VITALS — BP 135/79 | HR 72

## 2023-11-30 DIAGNOSIS — C4492 Squamous cell carcinoma of skin, unspecified: Secondary | ICD-10-CM

## 2023-11-30 DIAGNOSIS — C44629 Squamous cell carcinoma of skin of left upper limb, including shoulder: Secondary | ICD-10-CM | POA: Diagnosis not present

## 2023-11-30 DIAGNOSIS — L820 Inflamed seborrheic keratosis: Secondary | ICD-10-CM

## 2023-11-30 DIAGNOSIS — L821 Other seborrheic keratosis: Secondary | ICD-10-CM | POA: Diagnosis not present

## 2023-11-30 NOTE — Patient Instructions (Addendum)
 Cryotherapy Aftercare  Wash gently with soap and water everyday.   Apply Vaseline and Band-Aid daily until healed.   Skin Education : We counseled the patient regarding the following: Sun screen (SPF 30 or greater) should be applied during peak UV exposure (between 10am and 2pm) and reapplied after exercise or swimming.  The ABCDEs of melanoma were reviewed with the patient, and the importance of monthly self-examination of moles was emphasized. Should any moles change in shape or color, or itch, bleed or burn, pt will contact our office for evaluation sooner then their interval appointment.  Plan: Sunscreen Recommendations We recommended a broad spectrum sunscreen with a SPF of 30 or higher.  SPF 30 sunscreens block approximately 97 percent of the sun's harmful rays. Sunscreens should be applied at least 15 minutes prior to expected sun exposure and then every 2 hours after that as long as sun exposure continues. If swimming or exercising sunscreen should be reapplied every 45 minutes to an hour after getting wet or sweating. One ounce, or the equivalent of a shot glass full of sunscreen, is adequate to protect the skin not covered by a bathing suit. We also recommended a lip balm with a sunscreen as well. Sun protective clothing can be used in lieu of sunscreen but must be worn the entire time you are exposed to the sun's rays.   Important Information   Due to recent changes in healthcare laws, you may see results of your pathology and/or laboratory studies on MyChart before the doctors have had a chance to review them. We understand that in some cases there may be results that are confusing or concerning to you. Please understand that not all results are received at the same time and often the doctors may need to interpret multiple results in order to provide you with the best plan of care or course of treatment. Therefore, we ask that you please give [place] 2 business days to thoroughly review all  your results before contacting the office for clarification. Should we see a critical lab result, you will be contacted sooner.     If You Need Anything After Your Visit   If you have any questions or concerns for your doctor, please call our main line at [phone]. If no one answers, please leave a voicemail as directed and we will return your call as soon as possible. Messages left after 4 pm will be answered the following business day.    You may also send [place] a message via MyChart. We typically respond to MyChart messages within 1-2 business days.  For prescription refills, please ask your pharmacy to contact our office. Our fax number is [phone].  If you have an urgent issue when the clinic is closed that cannot wait until the next business day, you can page your doctor at the number below.     Please note that while we do our best to be available for urgent issues outside of office hours, we are not available 24/7.    If you have an urgent issue and are unable to reach [place], you may choose to seek medical care at your doctor's office, retail clinic, urgent care center, or emergency room.   If you have a medical emergency, please immediately call 911 or go to the emergency department. In the event of inclement weather, please call our main line at [phone] for an update on the status of any delays or closures.  Dermatology Medication Tips: Please keep the boxes that  topical medications come in in order to help keep track of the instructions about where and how to use these. Pharmacies typically print the medication instructions only on the boxes and not directly on the medication tubes.   If your medication is too expensive, please contact our office at [phone] or send [place] a message through MyChart.    We are unable to tell what your co-pay for medications will be in advance as this is different depending on your insurance coverage. However, we may be able to find a  substitute medication at lower cost or fill out paperwork to get insurance to cover a needed medication.    If a prior authorization is required to get your medication covered by your insurance company, please allow [place] 1-2 business days to complete this process.   Drug prices often vary depending on where the prescription is filled and some pharmacies may offer cheaper prices.   The website www.goodrx.com contains coupons for medications through different pharmacies. The prices here do not account for what the cost may be with help from insurance (it may be cheaper with your insurance), but the website can give you the price if you did not use any insurance.  - You can print the associated coupon and take it with your prescription to the pharmacy.  - You may also stop by our office during regular business hours and pick up a GoodRx coupon card.  - If you need your prescription sent electronically to a different pharmacy, notify our office through [facility] or by phone at [phone]

## 2023-11-30 NOTE — Progress Notes (Signed)
   New Patient Visit   Subjective  [name] is a 63 y.o. male who presents for the following: growths on the face The patient has spots, moles and lesions to be evaluated, some may be new or changing and the patient may have concern these could be cancer. 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  The following portions of the chart were reviewed this encounter and updated as appropriate: medications, allergies, medical history  Review of Systems:  No other skin or systemic complaints except as noted in HPI or Assessment and Plan.  Objective  Well appearing patient in no apparent distress; mood and affect are within normal limits.  A focused examination was performed of the following areas: face  Relevant exam findings are noted in the Assessment and Plan.    Assessment & Plan   Squamous Cell Carcinoma- Left Forearm Exam: open wound  Treatment Plan: Mohs surgery scheduled for 12/08/2023 with Dr. Caralyn Guile INFLAMED SEBORRHEIC KERATOSIS Left Zygomatic Area Destruction of lesion - Left Zygomatic Area Complexity: simple   Destruction method: cryotherapy   Lesion destroyed using liquid nitrogen: Yes   SQUAMOUS CELL CARCINOMA OF SKIN   SEBORRHEIC KERATOSES    SEBORRHEIC KERATOSIS - Stuck-on, waxy, tan-brown papules and/or plaques  - Benign-appearing - Discussed benign etiology and prognosis. - Observe - Call for any changes  Return for scc treatment.  I, Tillie Fantasia, CMA, am acting as scribe for Gwenith Daily, MD.   Documentation: I have reviewed the above documentation for accuracy and completeness, and I agree with the above.  Gwenith Daily, MD

## 2023-12-07 ENCOUNTER — Encounter: Payer: Self-pay | Admitting: Dermatology

## 2023-12-08 ENCOUNTER — Encounter: Payer: Self-pay | Admitting: Dermatology

## 2023-12-08 ENCOUNTER — Ambulatory Visit: Payer: Medicare Other | Admitting: Dermatology

## 2023-12-08 ENCOUNTER — Telehealth: Payer: Self-pay | Admitting: Dermatology

## 2023-12-08 ENCOUNTER — Other Ambulatory Visit: Payer: Self-pay | Admitting: Dermatology

## 2023-12-08 VITALS — BP 113/70 | HR 62 | Temp 98.4°F

## 2023-12-08 DIAGNOSIS — L579 Skin changes due to chronic exposure to nonionizing radiation, unspecified: Secondary | ICD-10-CM

## 2023-12-08 DIAGNOSIS — C44629 Squamous cell carcinoma of skin of left upper limb, including shoulder: Secondary | ICD-10-CM

## 2023-12-08 DIAGNOSIS — C4492 Squamous cell carcinoma of skin, unspecified: Secondary | ICD-10-CM

## 2023-12-08 DIAGNOSIS — R238 Other skin changes: Secondary | ICD-10-CM

## 2023-12-08 DIAGNOSIS — L814 Other melanin hyperpigmentation: Secondary | ICD-10-CM | POA: Diagnosis not present

## 2023-12-08 MED ORDER — OXYCODONE HCL 5 MG PO TABS
5.0000 mg | ORAL_TABLET | ORAL | 0 refills | Status: AC | PRN
Start: 2023-12-08 — End: ?

## 2023-12-08 MED ORDER — MUPIROCIN 2 % EX OINT
1.0000 | TOPICAL_OINTMENT | Freq: Every day | CUTANEOUS | 0 refills | Status: DC
Start: 2023-12-08 — End: 2024-04-21

## 2023-12-08 NOTE — Patient Instructions (Addendum)
 Wound Care Instructions for After Surgery  On the day following your surgery, you should begin doing daily dressing changes until your sutures are removed: Remove the bandage. Cleanse the wound gently with soap and water.  Make sure you then dry the skin surrounding the wound completely or the tape will not stick to the skin. Do not use cotton balls on the wound. After the wound is clean and dry, apply the ointment (either prescription antibiotic prescribed by your doctor or plain Vaseline if nothing was prescribed) gently with a Q-tip. If you are using a bandaid to cover: Apply a bandaid large enough to cover the entire wound. If you do not have a bandaid large enough to cover the wound OR if you are sensitive to bandaid adhesive: Cut a non-stick pad (such as Telfa) to fit the size of the wound.  Cover the wound with the non-stick pad. If the wound is draining, you may want to add a small amount of gauze on top of the non-stick pad for a little added compression to the area. Use tape to seal the area completely.  For the next 1-2 weeks: Be sure to keep the wound moist with ointment 24/7 to ensure best healing. If you are unable to cover the wound with a bandage to hold the ointment in place, you may need to reapply the ointment several times a day. Do not bend over or lift heavy items to reduce the chance of elevated blood pressure to the wound. Do not participate in particularly strenuous activities.  Below is a list of dressing supplies you might need.  Cotton-tipped applicators - Q-tips Gauze pads (2x2 and/or 4x4) - All-Purpose Sponges New and clean tube of petroleum jelly (Vaseline) OR prescription antibiotic ointment if prescribed Either a bandaid large enough to cover the entire wound OR non-stick dressing material (Telfa) and Tape (Paper or Hypafix)  FOR ADULT SURGERY PATIENTS: If you need something for pain relief, you may take 1 extra strength Tylenol (acetaminophen) and 2  ibuprofen (200 mg) together every 4 hours as needed. (Do not take these medications if you are allergic to them or if you know you cannot take them for any other reason). Typically you may only need pain medication for 1-3 days.   Comments on the Post-Operative Period Slight swelling and redness often appear around the wound. This is normal and will disappear within several days following the surgery. The healing wound will drain a brownish-red-yellow discharge during healing. This is a normal phase of wound healing. As the wound begins to heal, the drainage may increase in amount. Again, this drainage is normal. Notify us if the drainage becomes persistently bloody, excessively swollen, or intensely painful or develops a foul odor or red streaks.  The healing wound will also typically be itchy. This is normal. If you have severe or persistent pain, Notify us if the discomfort is severe or persistent. Avoid alcoholic beverages when taking pain medicine.  In Case of Wound Hemorrhage A wound hemorrhage is when the bandage suddenly becomes soaked with bright red blood and flows profusely. If this happens, sit down or lie down with your head elevated. If the wound has a dressing on it, do not remove the dressing. Apply pressure to the existing gauze. If the wound is not covered, use a gauze pad to apply pressure and continue applying the pressure for 20 minutes without peeking. DO NOT COVER THE WOUND WITH A LARGE TOWEL OR WASH CLOTH. Release your hand from  the wound site but do not remove the dressing. If the bleeding has stopped, gently clean around the wound. Leave the dressing in place for 24 hours if possible. This wait time allows the blood vessels to close off so that you do not spark a new round of bleeding by disrupting the newly clotted blood vessels with an immediate dressing change. If the bleeding does not subside, continue to hold pressure for 40 minutes. If bleeding continues, page your  physician, contact an After Hours clinic or go to the Emergency Room.   Important Information   Due to recent changes in healthcare laws, you may see results of your pathology and/or laboratory studies on MyChart before the doctors have had a chance to review them. We understand that in some cases there may be results that are confusing or concerning to you. Please understand that not all results are received at the same time and often the doctors may need to interpret multiple results in order to provide you with the best plan of care or course of treatment. Therefore, we ask that you please give [place] 2 business days to thoroughly review all your results before contacting the office for clarification. Should we see a critical lab result, you will be contacted sooner.     If You Need Anything After Your Visit   If you have any questions or concerns for your doctor, please call our main line at [phone]. If no one answers, please leave a voicemail as directed and we will return your call as soon as possible. Messages left after 4 pm will be answered the following business day.    You may also send [place] a message via MyChart. We typically respond to MyChart messages within 1-2 business days.  For prescription refills, please ask your pharmacy to contact our office. Our fax number is [phone].  If you have an urgent issue when the clinic is closed that cannot wait until the next business day, you can page your doctor at the number below.     Please note that while we do our best to be available for urgent issues outside of office hours, we are not available 24/7.    If you have an urgent issue and are unable to reach [place], you may choose to seek medical care at your doctor's office, retail clinic, urgent care center, or emergency room.   If you have a medical emergency, please immediately call 911 or go to the emergency department. In the event of inclement weather, please call our main line at  [phone] for an update on the status of any delays or closures.  Dermatology Medication Tips: Please keep the boxes that topical medications come in in order to help keep track of the instructions about where and how to use these. Pharmacies typically print the medication instructions only on the boxes and not directly on the medication tubes.   If your medication is too expensive, please contact our office at [phone] or send [place] a message through MyChart.    We are unable to tell what your co-pay for medications will be in advance as this is different depending on your insurance coverage. However, we may be able to find a substitute medication at lower cost or fill out paperwork to get insurance to cover a needed medication.    If a prior authorization is required to get your medication covered by your insurance company, please allow [place] 1-2 business days to complete this process.   Drug  prices often vary depending on where the prescription is filled and some pharmacies may offer cheaper prices.   The website www.goodrx.com contains coupons for medications through different pharmacies. The prices here do not account for what the cost may be with help from insurance (it may be cheaper with your insurance), but the website can give you the price if you did not use any insurance.  - You can print the associated coupon and take it with your prescription to the pharmacy.  - You may also stop by our office during regular business hours and pick up a GoodRx coupon card.  - If you need your prescription sent electronically to a different pharmacy, notify our office through [facility] or by phone at [phone]

## 2023-12-08 NOTE — Telephone Encounter (Signed)
 Patient called stating that the pharmacy has not received the prescription for pain medication.

## 2023-12-08 NOTE — Progress Notes (Unsigned)
 Follow-Up Visit   Subjective  [name] is a 63 y.o. 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.   He also has a lesion of concern on his back, present for several months, not changing or growing.  The following portions of the chart were reviewed this encounter and updated as appropriate: medications, allergies, medical history  Review of Systems:  No other skin or systemic complaints except as noted in HPI or Assessment and Plan.  Objective  Well appearing patient in no apparent distress; mood and affect are within normal limits.  A focused examination was performed of the following areas: Left forearm Relevant physical exam findings are noted in the Assessment and Plan.   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 Yes   Was the anticoagulation regimen changed prior to Mohs? No    Procedure Details: Timeout: pre-procedure verification complete Procedure Prep: patient was prepped and draped in usual sterile fashion Prep type: chlorhexidine Frozen section biopsy performed: No   Specimen debulked: No   Pre-Op diagnosis: squamous cell carcinoma MohsAIQ Surgical site (if tumor spans multiple areas, please select predominant area): upper extremity Surgery side: left Surgical site (from skin exam): Left Forearm - Posterior Pre-operative length (cm): 2.6 Pre-operative width (cm): 1.5 Indications for Mohs surgery: anatomic location where tissue conservation is critical Previously treated? No    Micrographic Surgery Details: Post-operative length (cm): 4.1 Post-operative width (cm): 3 Number of Mohs stages: 2 Is this a complex case (associate members  only): No    Stage 1    Tumor features identified on Mohs section: squamous cell carcinoma    Depth of defect after stage: subcutaneous fat    Perineural invasion: no perineural invasion  Stage 2    Tumor features identified on Mohs section: no tumor identified    Depth of defect after stage: subcutaneous fat    Perineural invasion: no perineural invasion  Patient tolerance of procedure: tolerated well, no immediate complications  Reconstruction: Was the defect reconstructed? Yes   Was reconstruction performed by the same Mohs surgeon? Yes   Setting of reconstruction: outpatient office When was reconstruction performed? same day Type of reconstruction: flap Type of flap: advancement    Opioids: Did the patient receive a prescription for opioid/narcotic related to Mohs surgery? Yes   Indications for opioid/narcotics: patient required additional pain relief despite trial of non-opioid analgesia  Antibiotics: Does patient meet AHA guidelines for endocarditis?: No   Does patient meet AHA guidelines for orthopedic prophylaxis?: No   Were antibiotics given on the day of surgery?: No   Did surgery breach mucosa, expose cartilage/bone, involve an area of lymphedema/inflamed/infected tissue? No   Related Medications mupirocin ointment (BACTROBAN) 2 % Apply 1 Application topically daily. With dressing changes INFLAMMATORY PAPULE    INFLAMMATORY PAPULE Exam: Excoriation of left upper back  Treatment Plan: Recheck on follow up  Return in about 2 weeks (around 12/22/2023).  Dominga Ferry, Surg Tech III, am acting as scribe for Gwenith Daily, MD.    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 They note  that the area has been present for about 2 months increasing in size with time.  There is no history of previous treatment.   Reports no other new or changing lesions and has no other complaints today.  Medications and allergies: see patient chart.  Review of systems: Reviewed 8 systems and notable for the above skin cancer.  All other systems reviewed are unremarkable/negative, unless noted in the HPI. Past medical history, surgical history, family history, social history were also reviewed and are noted in the chart/questionnaire.    PHYSICAL EXAMINATION  General: Well-appearing, in no acute distress, alert and oriented x 4. Vitals reviewed in chart (if available).   Skin: Exam reveals a 2.6 x 1.5 cm erythematous papule and biopsy scar on the left forearm. There are rhytids, telangiectasias, and lentigines, consistent with photodamage.  Biopsy report(s) reviewed, confirming the diagnosis.   ASSESSMENT  1) Well Differentiated Squamous Cell Carcinoma of the left forearm 2) photodamage 3) solar lentigines   PLAN   1. Due to location, size, histology, or recurrence and the likelihood of subclinical extension as well as the need to conserve normal surrounding tissue, the patient was deemed acceptable for Mohs micrographic surgery (MMS).  The nature and purpose of the procedure, associated benefits and risks including recurrence and scarring, possible complications such as pain, infection, and bleeding, and alternative methods of treatment if appropriate were discussed with the patient during consent. The lesion location was verified by the patient, by reviewing previous notes, pathology reports, and by photographs as well as angulation measurements if available.  Informed consent was reviewed and signed by the patient, and timeout was performed at 9:00 AM. See op note below.  2. For the photodamage and solar lentigines, sun protection discussed/information given on OTC sunscreens, and we recommend continued regular follow-up with primary dermatologist every 6 months or sooner for any growing, bleeding, or changing  lesions. 3. Prognosis and future surveillance discussed. 4. Letter with treatment outcome sent to referring provider. 5. Pain acetaminophen/ibuprofen/oxycodone 5 mg   MOHS MICROGRAPHIC SURGERY AND RECONSTRUCTION  Initial size:   2.6 x 1.5 cm Surgical defect/wound size: 4.1 x 3.0 cm Anesthesia:    0.33% lidocaine with 1:200,000 epinephrine EBL:    <5 mL Complications:  None Repair type:   Adjacent Tissue Transfer (Tripolar Advancement Flap) SQ suture:   3-0 Vicryl Cutaneous suture:  4-0 Polyprolene Final size of the repair: 7.0 x 2.5 = 17.5 cm^2  Stages: 2  STAGE I: Anesthesia achieved with 0.5% lidocaine with 1:200,000 epinephrine. ChloraPrep applied. 1 section(s) excised using Mohs technique (this includes total peripheral and deep tissue margin excision and evaluation with frozen sections, excised and interpreted by the same physician). The tumor was first debulked and then excised with an approx. 2 mm margin.  Hemostasis was achieved with electrocautery as needed.  The specimen was then oriented, subdivided/relaxed, inked, and processed using Mohs technique.    Frozen section analysis revealed a positive margin for full thickness epidermal architectural and cellular atypia, apoptotic cells, individual cell dyskeratosis with markedly altered maturation but usually still some surface keratinization and intercellular bridges present with marked nuclear atypia, including nuclear hyperchromasia and multinucleation in the peripheral margin.    STAGE II: An additional 2 mm margin was excised.  Hemostasis was achieved with electrocautery as needed.  The specimen was then oriented, subdivided/relaxed, inked, and processed using Mohs technique. Evaluation of slides by the Mohs surgeon revealed clear tumor margins.  Reconstruction  PROCEDURE: Advancement Flap The nature of the  procedure was discussed with the patient in detail, including alternatives.  The risks discussed included but not  limited to potential for infection, bleeding, scar formation, and damage to underlying structures.  The patient understood the risks and signed the consent form (scanned into chart).  This wound was reconstructed with an advancement flap.  Local anesthesia was achieved with the anesthetic indicated above.  The operative site was prepped with a surgical antiseptic solution, and then draped with sterile towels to insure a sterile field.  The beveled edges of the wound were excised to 90 degrees relative to the surface skin plane.  The wound was undermined in all directions, and meticulous hemostasis was achieved with an electrosurgical device.  Relaxing incisions were made, if necessary, for lateral tension release.  The tissue was advanced and closed centrally, and redundant tissue was trimmed as necessary.  The wound was sutured in a layered fashion to close potential dead space and to precisely and securely approximate the wound edges.  The dimensions of the flap were:  7.0 cm x 2.5 cm for a total flap surface area of 17.5 centimeters squared (cm2).    The wound was covered with petrolatum and a dressing.  The patient understands the need to return immediately for any signs of infection to include swelling, pain, purulent discharge, localized warmth, or fever.  Contact information was provided to the patient (including after-hours pager numbers)    Documentation: I have reviewed the above documentation for accuracy and completeness, and I agree with the above.  Gwenith Daily, MD

## 2023-12-09 ENCOUNTER — Encounter: Payer: Self-pay | Admitting: Dermatology

## 2023-12-13 ENCOUNTER — Other Ambulatory Visit: Payer: Self-pay | Admitting: Dermatology

## 2023-12-13 MED ORDER — DOXYCYCLINE HYCLATE 100 MG PO TABS: 100.0000 mg | ORAL_TABLET | Freq: Two times a day (BID) | ORAL | 0 refills | Status: AC

## 2023-12-13 NOTE — Progress Notes (Signed)
 Pt called and had concerns about wound and pain. Dr. Caralyn Guile wants pt to take doxycycline for 7 days.

## 2023-12-22 ENCOUNTER — Ambulatory Visit: Payer: Medicare Other | Admitting: Dermatology

## 2023-12-22 ENCOUNTER — Encounter: Payer: Self-pay | Admitting: Dermatology

## 2023-12-22 DIAGNOSIS — W098XXA Fall on or from other playground equipment, initial encounter: Secondary | ICD-10-CM

## 2023-12-22 DIAGNOSIS — Z48817 Encounter for surgical aftercare following surgery on the skin and subcutaneous tissue: Secondary | ICD-10-CM

## 2023-12-22 DIAGNOSIS — Z85828 Personal history of other malignant neoplasm of skin: Secondary | ICD-10-CM

## 2023-12-22 DIAGNOSIS — T1490XD Injury, unspecified, subsequent encounter: Secondary | ICD-10-CM

## 2023-12-22 DIAGNOSIS — L578 Other skin changes due to chronic exposure to nonionizing radiation: Secondary | ICD-10-CM

## 2023-12-22 DIAGNOSIS — C4492 Squamous cell carcinoma of skin, unspecified: Secondary | ICD-10-CM

## 2023-12-22 NOTE — Patient Instructions (Signed)

## 2023-12-22 NOTE — Progress Notes (Signed)
   Follow Up Visit   Subjective  [name] is a 63 y.o. male who presents for the following: follow up from Mohs surgery   The patient presents for follow up from Mohs surgery for a SCC on the left forearm, posterior, treated on 12/08/23, repaired with advancement flap. The patient has been bandaging the wound as directed. The endorse the following concerns: none  He is accompanied by his mother.  The following portions of the chart were reviewed this encounter and updated as appropriate: medications, allergies, medical history  Review of Systems:  No other skin or systemic complaints except as noted in HPI or Assessment and Plan.  Objective  Well appearing patient in no apparent distress; mood and affect are within normal limits.  A full examination was performed including scalp, head, face and left forearm. All findings within normal limits unless otherwise noted below.  Healing wound with mild erythema  Relevant physical exam findings are noted in the Assessment and Plan.     Assessment & Plan   Healing s/p Mohs for SCC, treated on 12/08/23, repaired with advancement flap. - Reassured that wound is healing well - Sutures removed - No evidence of infection - No swelling, induration, purulence, dehiscence, or tenderness out of proportion to the clinical exam, see photo above - Discussed that scars take up to 12 months to mature from the date of surgery - Recommend SPF 30+ to scar daily to prevent purple color from UV exposure during scar maturation process - Discussed that erythema and raised appearance of scar will fade over the next 4-6 months - OK to start scar massage at 4-6 weeks post-op - Can consider silicone based products for scar healing starting at 6 weeks post-op - Ok to continue ointment daily to wound under a bandage for another week  ACTINIC DAMAGE - chronic, secondary to cumulative UV radiation exposure/sun exposure over time - diffuse scaly erythematous  macules with underlying dyspigmentation - Recommend daily broad spectrum sunscreen SPF 30+ to sun-exposed areas, reapply every 2 hours as needed.  - Recommend staying in the shade or wearing long sleeves, sun glasses (UVA+UVB protection) and wide brim hats (4-inch brim around the entire circumference of the hat). - Call for new or changing lesions.  HISTORY OF SQUAMOUS CELL CARCINOMA OF THE SKIN - No evidence of recurrence today - No lymphadenopathy - Recommend regular full body skin exams - Recommend daily broad spectrum sunscreen SPF 30+ to sun-exposed areas, reapply every 2 hours as needed.  - Call if any new or changing lesions are noted between office visits   Return in about 2 weeks (around 01/05/2024) for wound check.  I, Tillie Fantasia, CMA, am acting as scribe for Gwenith Daily, MD.   Documentation: I have reviewed the above documentation for accuracy and completeness, and I agree with the above.  Gwenith Daily, MD

## 2024-01-05 ENCOUNTER — Ambulatory Visit: Admitting: Dermatology

## 2024-01-05 ENCOUNTER — Encounter: Payer: Self-pay | Admitting: Dermatology

## 2024-01-05 VITALS — BP 120/68 | HR 60

## 2024-01-05 DIAGNOSIS — T1490XD Injury, unspecified, subsequent encounter: Secondary | ICD-10-CM

## 2024-01-05 DIAGNOSIS — C4492 Squamous cell carcinoma of skin, unspecified: Secondary | ICD-10-CM

## 2024-01-05 DIAGNOSIS — L219 Seborrheic dermatitis, unspecified: Secondary | ICD-10-CM | POA: Diagnosis not present

## 2024-01-05 DIAGNOSIS — M720 Palmar fascial fibromatosis [Dupuytren]: Secondary | ICD-10-CM | POA: Diagnosis not present

## 2024-01-05 MED ORDER — KETOCONAZOLE 2 % EX CREA
1.0000 | TOPICAL_CREAM | Freq: Two times a day (BID) | CUTANEOUS | 2 refills | Status: AC
Start: 2024-01-05 — End: 2024-02-16

## 2024-01-05 MED ORDER — FLUOCINONIDE 0.05 % EX CREA
1.0000 | TOPICAL_CREAM | Freq: Two times a day (BID) | CUTANEOUS | 2 refills | Status: DC
Start: 2024-01-05 — End: 2024-08-09

## 2024-01-05 NOTE — Progress Notes (Signed)
 Follow Up Visit   Subjective  [name] is a 63 y.o. male who presents for the following: follow up from Mohs surgery   The patient presents for follow up from Mohs surgery for a SCC on the left forearm-posterior, treated on 12/08/23, repaired with advancement flap. The patient has been bandaging the wound as directed. The endorse the following concerns: left 4th finger getting stuck when he bends it.   The following portions of the chart were reviewed this encounter and updated as appropriate: medications, allergies, medical history  Review of Systems:  No other skin or systemic complaints except as noted in HPI or Assessment and Plan.  Objective  Well appearing patient in no apparent distress; mood and affect are within normal limits.  A full examination was performed including scalp, head, face and left forearm. All findings within normal limits unless otherwise noted below.  Healing wound with mild erythema  Relevant physical exam findings are noted in the Assessment and Plan.    Assessment & Plan   Healing s/p Mohs for [facility], treated on 12/08/23, repaired with advancement flap - Reassured that wound is healing well - No evidence of infection - No swelling, induration, purulence, dehiscence, or tenderness out of proportion to the clinical exam, see photo above - Discussed that scars take up to 12 months to mature from the date of surgery - Recommend SPF 30+ to scar daily to prevent purple color from UV exposure during scar maturation process - Discussed that erythema and raised appearance of scar will fade over the next 4-6 months - OK to start scar massage at 4-6 weeks post-op - Can consider silicone based products for scar healing starting at 6 weeks post-op  HISTORY OF SQUAMOUS CELL CARCINOMA OF THE SKIN - No evidence of recurrence today - No lymphadenopathy - Recommend regular full body skin exams - Recommend daily broad spectrum sunscreen SPF 30+ to sun-exposed  areas, reapply every 2 hours as needed.  - Call if any new or changing lesions are noted between office visits  Dupuytren's Contracture- Left 4th finger The patient presents with a diagnosis of Dupuytren's contracture affecting the left fourth finger, which is unrelated to the Mohs surgery previously performed on his arm. Dupuytren's contracture is a progressive condition where the connective tissue under the skin of the palm thickens and forms nodules, eventually causing the fingers to bend inward. The patient has a history of Dupuytren's contracture affecting his right hand, which suggests a familial or genetic predisposition to the condition. Given the progression of the contracture in the left hand, it is recommended to refer the patient to orthopedic hand surgery for further evaluation and potential management options, including the possibility of surgical intervention if necessary to restore function and prevent further deformity.  SEBORRHEIC DERMATITIS Exam: Pink patches with greasy scale at side of mouth  Chronic and persistent condition with duration or expected duration over one year. Condition is bothersome/symptomatic for patient. Currently flared.   Seborrheic Dermatitis is a chronic persistent rash characterized by pinkness and scaling most commonly of the mid face but also can occur on the scalp (dandruff), ears; mid chest, mid back and groin.  It tends to be exacerbated by stress and cooler weather.  People who have neurologic disease may experience new onset or exacerbation of existing seborrheic dermatitis.  The condition is not curable but treatable and can be controlled.  Treatment Plan: Ketoconazole cream to area bid prn Lidex cream to area bid prn  Return for TBSE.  I, Tillie Fantasia, CMA, am acting as scribe for Gwenith Daily, MD.   Documentation: I have reviewed the above documentation for accuracy and completeness, and I agree with the above.  Gwenith Daily, MD

## 2024-01-05 NOTE — Patient Instructions (Addendum)

## 2024-02-02 ENCOUNTER — Telehealth (HOSPITAL_COMMUNITY): Payer: Self-pay

## 2024-02-02 ENCOUNTER — Encounter: Payer: Self-pay | Admitting: Dermatology

## 2024-02-02 ENCOUNTER — Ambulatory Visit: Admitting: Dermatology

## 2024-02-02 VITALS — BP 114/64 | HR 65

## 2024-02-02 DIAGNOSIS — D229 Melanocytic nevi, unspecified: Secondary | ICD-10-CM | POA: Diagnosis not present

## 2024-02-02 DIAGNOSIS — D1801 Hemangioma of skin and subcutaneous tissue: Secondary | ICD-10-CM

## 2024-02-02 DIAGNOSIS — Z85828 Personal history of other malignant neoplasm of skin: Secondary | ICD-10-CM

## 2024-02-02 DIAGNOSIS — L578 Other skin changes due to chronic exposure to nonionizing radiation: Secondary | ICD-10-CM

## 2024-02-02 DIAGNOSIS — L821 Other seborrheic keratosis: Secondary | ICD-10-CM

## 2024-02-02 DIAGNOSIS — L905 Scar conditions and fibrosis of skin: Secondary | ICD-10-CM | POA: Diagnosis not present

## 2024-02-02 DIAGNOSIS — L219 Seborrheic dermatitis, unspecified: Secondary | ICD-10-CM

## 2024-02-02 DIAGNOSIS — Z1283 Encounter for screening for malignant neoplasm of skin: Secondary | ICD-10-CM | POA: Diagnosis not present

## 2024-02-02 DIAGNOSIS — L814 Other melanin hyperpigmentation: Secondary | ICD-10-CM

## 2024-02-02 MED ORDER — KETOCONAZOLE 2 % EX SHAM: 1.0000 | MEDICATED_SHAMPOO | Freq: Once | CUTANEOUS | 3 refills | Status: AC

## 2024-02-02 MED ORDER — CLOBETASOL PROPIONATE 0.05 % EX SOLN: 1.0000 | Freq: Two times a day (BID) | CUTANEOUS | 3 refills | Status: DC

## 2024-02-02 NOTE — Telephone Encounter (Signed)
Returned call, no answer, left vm. AB

## 2024-02-02 NOTE — Patient Instructions (Signed)

## 2024-02-02 NOTE — Progress Notes (Signed)
 Follow-Up Visit   Subjective  [name] is a 63 y.o. male who presents for the following: Skin Cancer Screening and Full Body Skin Exam. Hx of scc.  The patient presents for Total-Body Skin Exam (TBSE) for skin cancer screening and mole check. The patient has spots, moles and lesions to be evaluated, some may be new or changing.  The following portions of the chart were reviewed this encounter and updated as appropriate: medications, allergies, medical history  Review of Systems:  No other skin or systemic complaints except as noted in HPI or Assessment and Plan.  Objective  Well appearing patient in no apparent distress; mood and affect are within normal limits.  A full examination was performed including scalp, head, eyes, ears, nose, lips, neck, chest, axillae, abdomen, back, buttocks, bilateral upper extremities, bilateral lower extremities, hands, feet, fingers, toes, fingernails, and toenails. All findings within normal limits unless otherwise noted below.   Relevant physical exam findings are noted in the Assessment and Plan.  Left Forearm - Anterior Well healed scar    Assessment & Plan   SKIN CANCER SCREENING PERFORMED TODAY.  ACTINIC DAMAGE - Chronic condition, secondary to cumulative UV/sun exposure - diffuse scaly erythematous macules with underlying dyspigmentation - Recommend daily broad spectrum sunscreen SPF 30+ to sun-exposed areas, reapply every 2 hours as needed.  - Staying in the shade or wearing long sleeves, sun glasses (UVA+UVB protection) and wide brim hats (4-inch brim around the entire circumference of the hat) are also recommended for sun protection.  - Call for new or changing lesions.  LENTIGINES, SEBORRHEIC KERATOSES, HEMANGIOMAS - Benign normal skin lesions - Benign-appearing - Call for any changes  MELANOCYTIC NEVI - Tan-brown and/or pink-flesh-colored symmetric macules and papules - Benign appearing on exam today - Observation - Call  clinic for new or changing moles - Recommend daily use of broad spectrum spf 30+ sunscreen to sun-exposed areas.   HISTORY OF SQUAMOUS CELL CARCINOMA OF THE SKIN - No evidence of recurrence today - No lymphadenopathy - Recommend regular full body skin exams - Recommend daily broad spectrum sunscreen SPF 30+ to sun-exposed areas, reapply every 2 hours as needed.  - Call if any new or changing lesions are noted between office visits  SEBORRHEIC DERMATITIS Exam: Pink patches with greasy scale at right occipital scalp  Flared  Seborrheic Dermatitis is a chronic persistent rash characterized by pinkness and scaling most commonly of the mid face but also can occur on the scalp (dandruff), ears; mid chest, mid back and groin.  It tends to be exacerbated by stress and cooler weather.  People who have neurologic disease may experience new onset or exacerbation of existing seborrheic dermatitis.  The condition is not curable but treatable and can be controlled.  Treatment Plan: Ketoconazole  shampoo daily as needed Clobetasol  solution BID as needed   HISTORY OF SCC (SQUAMOUS CELL CARCINOMA) OF SKIN Left Forearm - Anterior SEBORRHEIC DERMATITIS Scalp ketoconazole  (NIZORAL ) 2 % shampoo - Scalp Apply 1 Application topically once for 1 dose.  clobetasol  (TEMOVATE ) 0.05 % external solution - Scalp Apply 1 Application topically 2 (two) times daily. Related Medications ketoconazole  (NIZORAL ) 2 % cream Apply 1 Application topically 2 (two) times daily. fluocinonide  cream (LIDEX ) 0.05 % Apply 1 Application topically 2 (two) times daily. Return in about 6 months (around 08/03/2024) for TBSC.  I, Haig Levan, Surg Tech III, am acting as scribe for Deneise Finlay, MD.   Documentation: I have reviewed the above documentation for accuracy  and completeness, and I agree with the above.  Deneise Finlay, MD

## 2024-03-09 ENCOUNTER — Encounter: Payer: Self-pay | Admitting: Dermatology

## 2024-03-31 ENCOUNTER — Other Ambulatory Visit: Payer: Self-pay | Admitting: Gastroenterology

## 2024-04-07 ENCOUNTER — Encounter (HOSPITAL_COMMUNITY): Payer: Self-pay | Admitting: Interventional Radiology

## 2024-04-12 ENCOUNTER — Encounter (HOSPITAL_COMMUNITY): Payer: Self-pay | Admitting: Gastroenterology

## 2024-04-13 ENCOUNTER — Encounter (HOSPITAL_COMMUNITY): Payer: Self-pay | Admitting: Gastroenterology

## 2024-04-13 NOTE — Progress Notes (Signed)
 Attempted to obtain medical history for pre op call via telephone, unable to reach at this time. HIPAA compliant voicemail message left requesting return call to pre surgical testing department.

## 2024-04-16 ENCOUNTER — Emergency Department (HOSPITAL_COMMUNITY)

## 2024-04-16 ENCOUNTER — Emergency Department (HOSPITAL_COMMUNITY)
Admission: EM | Admit: 2024-04-16 | Discharge: 2024-04-16 | Disposition: A | Discharge status: Home / Self Care | Attending: Emergency Medicine | Admitting: Emergency Medicine

## 2024-04-16 ENCOUNTER — Other Ambulatory Visit: Payer: Self-pay

## 2024-04-16 DIAGNOSIS — S81832A Puncture wound without foreign body, left lower leg, initial encounter: Secondary | ICD-10-CM | POA: Diagnosis not present

## 2024-04-16 DIAGNOSIS — Z23 Encounter for immunization: Secondary | ICD-10-CM | POA: Insufficient documentation

## 2024-04-16 DIAGNOSIS — S60221A Contusion of right hand, initial encounter: Secondary | ICD-10-CM | POA: Insufficient documentation

## 2024-04-16 DIAGNOSIS — S8992XA Unspecified injury of left lower leg, initial encounter: Secondary | ICD-10-CM | POA: Diagnosis present

## 2024-04-16 DIAGNOSIS — S81812A Laceration without foreign body, left lower leg, initial encounter: Secondary | ICD-10-CM

## 2024-04-16 DIAGNOSIS — Z7901 Long term (current) use of anticoagulants: Secondary | ICD-10-CM | POA: Insufficient documentation

## 2024-04-16 DIAGNOSIS — W01198A Fall on same level from slipping, tripping and stumbling with subsequent striking against other object, initial encounter: Secondary | ICD-10-CM | POA: Diagnosis not present

## 2024-04-16 MED ORDER — TETANUS-DIPHTH-ACELL PERTUSSIS 5-2.5-18.5 LF-MCG/0.5 IM SUSY
0.5000 mL | PREFILLED_SYRINGE | Freq: Once | INTRAMUSCULAR | Status: AC
  Administered 2024-04-16: 0.5 mL via INTRAMUSCULAR
  Filled 2024-04-16: qty 0.5

## 2024-04-16 NOTE — ED Notes (Signed)
 Pt refused to be hooked up to the monitor for vitals. Paramedic advised.

## 2024-04-16 NOTE — ED Provider Triage Note (Signed)
 Emergency Medicine Provider Triage Evaluation Note  DECLAN MIER , a 63 y.o. male  was evaluated in triage.  Pt complains of mechanical fall yesterday. He reports that he was trying to hurry and mow his lawn yesterday before the rain. When he went to walk in the lawn he stepped on his shoelace tripping. He reports that he cut his left shin on his hedge clippers and didn't know when his last tetanus was. Additionally, he is having pain the the MIP area of the index and middle fingers. No snuff box tenderness. Denies any other pain. Did not hit his head.  Review of Systems  Positive:  Negative:   Physical Exam  BP (!) 148/73 (BP Location: Right Arm)   Pulse (!) 57   Temp 98 F (36.7 C) (Oral)   Resp 18   SpO2 100%  Gen:   Awake, no distress   Resp:  Normal effort  MSK:   Moves extremities without difficulty  Other:    Medical Decision Making  Medically screening exam initiated at 6:13 PM.  Appropriate orders placed.  Charlie LITTIE Lipps was informed that the remainder of the evaluation will be completed by another provider, this initial triage assessment does not replace that evaluation, and the importance of remaining in the ED until their evaluation is complete.  XR ordered. Tetanus was updated in 2023.   Bernis Ernst, [place] 04/16/24 1815

## 2024-04-16 NOTE — ED Triage Notes (Signed)
 Pt presents to the ED for a fall that he sustained yesterday. Pt states that he landed on his hedge clippers. Pt is able to ambulate with no assistance, but swelling is presenting around his lower extremity and right wrist.

## 2024-04-17 NOTE — ED Provider Notes (Signed)
 [place] EMERGENCY DEPARTMENT AT [facility] Provider Note   CSN: [id number] Arrival date & time: 04/16/24  1733     Patient presents with: Leg Pain   FRAZER RAINVILLE is a 63 y.o. male.    Leg Pain Patient presents after fall.  Tripped on his shoelace hit his right hand on the ground and landed on the hedge tremor with his left lower leg.  Fell yesterday.  No other injury.  Unknown last tetanus.  He is on Plavix .  Did not hit head.     Prior to Admission medications   Medication Sig Start Date End Date Taking? Authorizing Provider  albuterol  (PROVENTIL  HFA;VENTOLIN  HFA) 108 (90 BASE) MCG/ACT inhaler Inhale 2 puffs into the lungs every 6 (six) hours as needed for wheezing or shortness of breath.     [provider]  amiodarone  (PACERONE ) 100 MG tablet Take 100 mg by mouth daily.    [provider]  apixaban  (ELIQUIS ) 5 MG TABS tablet Take 1 tablet (5 mg total) by mouth 2 (two) times daily. 10/19/20   Patsy Lenis, MD  atorvastatin  (LIPITOR) 40 MG tablet Take 40 mg by mouth daily. 12/25/21   [provider]  Budeson-Glycopyrrol-Formoterol (BREZTRI  AEROSPHERE) 160-9-4.8 MCG/ACT AERO Inhale 2 puffs into the lungs in the morning and at bedtime. 03/31/22   Meade Verdon RAMAN, MD  clobetasol  (TEMOVATE ) 0.05 % external solution Apply 1 Application topically 2 (two) times daily. 02/02/24   Paci, Karina M, MD  clopidogrel  (PLAVIX ) 75 MG tablet Take 0.5 tablets (37.5 mg total) by mouth daily. Patient taking differently: Take 37.5 mg by mouth in the morning and at bedtime. 10/19/20   Patsy Lenis, MD  esomeprazole (NEXIUM) 40 MG capsule Take 40 mg by mouth daily. 03/09/23   [provider]  fluocinonide  cream (LIDEX ) 0.05 % Apply 1 Application topically 2 (two) times daily. 01/05/24   Paci, Karina M, MD  furosemide  (LASIX ) 40 MG tablet Take 40 mg by mouth 2 (two) times daily. 03/18/23   [provider]  KLOR-CON  M10 10 MEQ tablet Take 10 mEq by  mouth daily. 08/22/20   [provider]  mupirocin  ointment (BACTROBAN ) 2 % Apply 1 Application topically daily. With dressing changes 12/08/23   Paci, Karina M, MD  nitroGLYCERIN  (NITROSTAT ) 0.4 MG SL tablet Place 0.4 mg under the tongue every 5 (five) minutes as needed for chest pain. 03/03/23   [provider]  oxyCODONE  (OXY IR/ROXICODONE ) 5 MG immediate release tablet Take 1 tablet (5 mg total) by mouth every 4 (four) hours as needed for up to 8 doses for severe pain (pain score 7-10). 12/08/23   Paci, Karina M, MD  pantoprazole  (PROTONIX ) 40 MG tablet Take 1 tablet (40 mg total) by mouth daily as needed (for heartburn). Patient not taking: Reported on 05/30/2023 10/19/20   Patsy Lenis, MD  phentermine 30 MG capsule Take 30 mg by mouth daily. 09/24/22   [provider]  potassium chloride  (KLOR-CON ) 10 MEQ tablet Take 2 tablets (20 mEq total) by mouth daily. 05/30/23   Cardama, Raynell Moder, MD    Allergies: Tylox [oxycodone -acetaminophen ], Gabapentin , and Adhesive [tape]    Review of Systems  Updated Vital Signs BP (!) 148/73 (BP Location: Right Arm)   Pulse (!) 57   Temp 98 F (36.7 C) (Oral)   Resp 18   Ht 5' 8 (1.727 m)   Wt 110 kg   SpO2 100%   BMI 36.87 kg/m   Physical Exam  Musculoskeletal:        General: Tenderness present.     Comments: Tenderness over 1st and 2nd MCP joint on left hand.  Multiple small puncture wounds on left anterior lower leg.  Does have edema on bilateral lower extremities.  Bleeding controlled on lower leg.  Sensation intact distally.  Neurological:     Mental Status: He is alert.     (all labs ordered are listed, but only abnormal results are displayed) Labs Reviewed - No data to display  EKG: None  Radiology: DG Tibia/Fibula Left Result Date: 04/16/2024 CLINICAL DATA:  Fall. Pain at the interphalangeal joint of the second digit of the middle finger. EXAM: LEFT TIBIA AND FIBULA - 2 VIEW; RIGHT HAND - COMPLETE 3+  VIEW COMPARISON:  None Available. FINDINGS: Left tibia/fibula. There is no evidence of acute fracture or dislocation. Degenerative changes are noted at the ankle and knee. There is mild calcaneal spurring. The soft tissues are within normal limits. Right hand: There is no acute fracture or dislocation. Degenerative changes are present at the interphalangeal joints and first carpometacarpal joint. Soft tissues are within normal limits. IMPRESSION: No acute fracture of the right hand or left tibia/fibula. Electronically Signed   By: Leita Birmingham M.D.   On: 04/16/2024 18:41   DG Hand Complete Right Result Date: 04/16/2024 CLINICAL DATA:  Fall. Pain at the interphalangeal joint of the second digit of the middle finger. EXAM: LEFT TIBIA AND FIBULA - 2 VIEW; RIGHT HAND - COMPLETE 3+ VIEW COMPARISON:  None Available. FINDINGS: Left tibia/fibula. There is no evidence of acute fracture or dislocation. Degenerative changes are noted at the ankle and knee. There is mild calcaneal spurring. The soft tissues are within normal limits. Right hand: There is no acute fracture or dislocation. Degenerative changes are present at the interphalangeal joints and first carpometacarpal joint. Soft tissues are within normal limits. IMPRESSION: No acute fracture of the right hand or left tibia/fibula. Electronically Signed   By: Leita Birmingham M.D.   On: 04/16/2024 18:41     Procedures   Medications Ordered in the ED  Tdap (BOOSTRIX) injection 0.5 mL (0.5 mLs Intramuscular Given 04/16/24 2104)                                    Medical Decision Making Risk Prescription drug management.   Patient with fall.  Puncture wounds to left leg and tenderness to right hand.  Imaging on both negative.  Tetanus updated.  Do not think we need antibiotics at this time.  Fall was yesterday.  Will discharge home.     Final diagnoses:  Contusion of right hand, initial encounter  Laceration of multiple sites of left lower extremity,  initial encounter    ED Discharge Orders     None          Patsey Lot, MD 04/17/24 1454

## 2024-04-21 ENCOUNTER — Ambulatory Visit (HOSPITAL_COMMUNITY)
Admission: RE | Admit: 2024-04-21 | Discharge: 2024-04-21 | Disposition: A | Discharge status: Home / Self Care | Attending: Gastroenterology | Admitting: Gastroenterology

## 2024-04-21 ENCOUNTER — Encounter (HOSPITAL_COMMUNITY): Payer: Self-pay | Admitting: Gastroenterology

## 2024-04-21 ENCOUNTER — Ambulatory Visit (HOSPITAL_COMMUNITY): Payer: Self-pay

## 2024-04-21 ENCOUNTER — Ambulatory Visit (HOSPITAL_BASED_OUTPATIENT_CLINIC_OR_DEPARTMENT_OTHER): Payer: Self-pay

## 2024-04-21 ENCOUNTER — Encounter (HOSPITAL_COMMUNITY)
Admission: RE | Disposition: A | Discharge status: Home / Self Care | Payer: Self-pay | Source: Home / Self Care | Attending: Gastroenterology

## 2024-04-21 ENCOUNTER — Other Ambulatory Visit: Payer: Self-pay

## 2024-04-21 DIAGNOSIS — K2100 Gastro-esophageal reflux disease with esophagitis, without bleeding: Secondary | ICD-10-CM

## 2024-04-21 DIAGNOSIS — Z8679 Personal history of other diseases of the circulatory system: Secondary | ICD-10-CM | POA: Insufficient documentation

## 2024-04-21 DIAGNOSIS — Z6836 Body mass index (BMI) 36.0-36.9, adult: Secondary | ICD-10-CM | POA: Insufficient documentation

## 2024-04-21 DIAGNOSIS — N189 Chronic kidney disease, unspecified: Secondary | ICD-10-CM | POA: Insufficient documentation

## 2024-04-21 DIAGNOSIS — I129 Hypertensive chronic kidney disease with stage 1 through stage 4 chronic kidney disease, or unspecified chronic kidney disease: Secondary | ICD-10-CM | POA: Insufficient documentation

## 2024-04-21 DIAGNOSIS — E119 Type 2 diabetes mellitus without complications: Secondary | ICD-10-CM | POA: Diagnosis not present

## 2024-04-21 DIAGNOSIS — J4489 Other specified chronic obstructive pulmonary disease: Secondary | ICD-10-CM | POA: Diagnosis not present

## 2024-04-21 DIAGNOSIS — J449 Chronic obstructive pulmonary disease, unspecified: Secondary | ICD-10-CM

## 2024-04-21 DIAGNOSIS — Z87891 Personal history of nicotine dependence: Secondary | ICD-10-CM | POA: Insufficient documentation

## 2024-04-21 DIAGNOSIS — K30 Functional dyspepsia: Secondary | ICD-10-CM | POA: Insufficient documentation

## 2024-04-21 DIAGNOSIS — Z8673 Personal history of transient ischemic attack (TIA), and cerebral infarction without residual deficits: Secondary | ICD-10-CM | POA: Diagnosis not present

## 2024-04-21 DIAGNOSIS — R0789 Other chest pain: Secondary | ICD-10-CM | POA: Diagnosis not present

## 2024-04-21 DIAGNOSIS — K449 Diaphragmatic hernia without obstruction or gangrene: Secondary | ICD-10-CM | POA: Insufficient documentation

## 2024-04-21 DIAGNOSIS — E1122 Type 2 diabetes mellitus with diabetic chronic kidney disease: Secondary | ICD-10-CM | POA: Diagnosis not present

## 2024-04-21 DIAGNOSIS — I480 Paroxysmal atrial fibrillation: Secondary | ICD-10-CM | POA: Diagnosis not present

## 2024-04-21 DIAGNOSIS — G4730 Sleep apnea, unspecified: Secondary | ICD-10-CM | POA: Diagnosis not present

## 2024-04-21 DIAGNOSIS — Z7902 Long term (current) use of antithrombotics/antiplatelets: Secondary | ICD-10-CM | POA: Diagnosis not present

## 2024-04-21 DIAGNOSIS — I2510 Atherosclerotic heart disease of native coronary artery without angina pectoris: Secondary | ICD-10-CM | POA: Diagnosis not present

## 2024-04-21 DIAGNOSIS — Z79899 Other long term (current) drug therapy: Secondary | ICD-10-CM | POA: Diagnosis not present

## 2024-04-21 DIAGNOSIS — E669 Obesity, unspecified: Secondary | ICD-10-CM | POA: Diagnosis not present

## 2024-04-21 DIAGNOSIS — Z7984 Long term (current) use of oral hypoglycemic drugs: Secondary | ICD-10-CM | POA: Diagnosis not present

## 2024-04-21 DIAGNOSIS — R1310 Dysphagia, unspecified: Secondary | ICD-10-CM | POA: Diagnosis not present

## 2024-04-21 HISTORY — PX: ESOPHAGOGASTRODUODENOSCOPY: SHX5428

## 2024-04-21 LAB — GLUCOSE, CAPILLARY: Glucose-Capillary: 106 mg/dL — ABNORMAL HIGH (ref 70–99)

## 2024-04-21 SURGERY — EGD (ESOPHAGOGASTRODUODENOSCOPY)
Anesthesia: Monitor Anesthesia Care

## 2024-04-21 MED ORDER — GLYCOPYRROLATE 0.2 MG/ML IJ SOLN
INTRAMUSCULAR | Status: DC | PRN
  Administered 2024-04-21: .2 mg via INTRAVENOUS

## 2024-04-21 MED ORDER — ONDANSETRON HCL 4 MG/2ML IJ SOLN
INTRAMUSCULAR | Status: DC | PRN
  Administered 2024-04-21: 4 mg via INTRAVENOUS

## 2024-04-21 MED ORDER — PROPOFOL 500 MG/50ML IV EMUL
INTRAVENOUS | Status: DC | PRN
  Administered 2024-04-21: 125 ug/kg/min via INTRAVENOUS

## 2024-04-21 MED ORDER — LIDOCAINE HCL (CARDIAC) PF 100 MG/5ML IV SOSY
PREFILLED_SYRINGE | INTRAVENOUS | Status: DC | PRN
  Administered 2024-04-21: 80 mg via INTRAVENOUS

## 2024-04-21 MED ORDER — PROPOFOL 10 MG/ML IV BOLUS
INTRAVENOUS | Status: DC | PRN
  Administered 2024-04-21: 70 mg via INTRAVENOUS

## 2024-04-21 MED ORDER — SODIUM CHLORIDE 0.9 % IV SOLN: INTRAVENOUS | Status: DC

## 2024-04-21 NOTE — Anesthesia Preprocedure Evaluation (Addendum)
 Anesthesia Evaluation  Patient identified by MRN, date of birth, ID band Patient awake    Reviewed: Allergy & Precautions, H&P , NPO status , Patient's Chart, lab work & pertinent test results, reviewed documented beta blocker date and time   History of Anesthesia Complications (+) PONV, Family history of anesthesia reaction and history of anesthetic complications  Airway Mallampati: I  TM Distance: >3 FB     Dental  (+) Edentulous Upper, Edentulous Lower   Pulmonary asthma , sleep apnea and Continuous Positive Airway Pressure Ventilation , pneumonia, resolved, COPD,  COPD inhaler, former smoker   Pulmonary exam normal breath sounds clear to auscultation       Cardiovascular hypertension, Pt. 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 TIA Neuromuscular disease CVA negative neurological ROS  negative psych ROS   GI/Hepatic negative GI ROS, Neg liver ROS, hiatal hernia,GERD  Medicated,,Dysphagia   Endo/Other  diabetes, Well Controlled, Type 2, Oral Hypoglycemic Agents  Obesity HLD Last HbA1C 5.7  Renal/GU Renal InsufficiencyRenal diseasenegative Renal ROS  negative genitourinary   Musculoskeletal negative musculoskeletal ROS (+) Arthritis , Osteoarthritis,    Abdominal  (+) + obese  Peds  Hematology negative hematology ROS (+) Plavix  therapy- last dose 7/5 Eliquis  therapy- last dose 7/7   Anesthesia Other Findings   Reproductive/Obstetrics negative OB ROS                              Anesthesia Physical Anesthesia Plan  ASA: 3  Anesthesia Plan: MAC   Post-op Pain Management: Minimal or no pain anticipated   Induction: Intravenous  PONV Risk Score and Plan: 3 and Treatment may vary due to age or  medical condition and Propofol  infusion  Airway Management Planned: Natural Airway, Simple Face Mask and Nasal Cannula  Additional Equipment: None  Intra-op Plan:   Post-operative Plan:   Informed Consent: I have reviewed the patients History and Physical, chart, labs and discussed the procedure including the risks, benefits and alternatives for the proposed anesthesia with the patient or authorized representative who has indicated his/her understanding and acceptance.     Dental advisory given  Plan Discussed with: CRNA and Anesthesiologist  Anesthesia Plan Comments:          Anesthesia Quick Evaluation

## 2024-04-21 NOTE — Anesthesia Postprocedure Evaluation (Signed)
 Anesthesia Post Note  Patient: [name]  Procedure(s) Performed: EGD (ESOPHAGOGASTRODUODENOSCOPY) Balloon dilation wire-guided     Patient location during evaluation: PACU Anesthesia Type: MAC Level of consciousness: awake and alert and oriented Pain management: pain level controlled Vital Signs Assessment: post-procedure vital signs reviewed and stable Respiratory status: spontaneous breathing, nonlabored ventilation and respiratory function stable Cardiovascular status: stable and blood pressure returned to baseline Postop Assessment: no apparent nausea or vomiting Anesthetic complications: no   No notable events documented.  Last Vitals:  Vitals:   04/21/24 1010 04/21/24 1020  BP: 137/72 126/76  Pulse: (!) 56 70  Resp: 19 18  Temp:    SpO2: 97% 95%    Last Pain:  Vitals:   04/21/24 1020  TempSrc:   PainSc: 0-No pain                 Javonna Balli A.

## 2024-04-21 NOTE — Discharge Instructions (Signed)

## 2024-04-21 NOTE — H&P (Signed)
 [name]: Two to 3 months ago the patient complained about chest pain and he had a catherization.  Per his report there was a blood clot and his chest pain resolved wtih treatment.  One week ago he started to have chest pain and he was instructed by his cardiologist to see GI.  His chest pain feels the same, but this time it occurs after PO intake.  He was informed by cardiology that amiodarone  may be causing some type of esophageal dysmotility.  There is a component of dysphagia wil some solid foods.  The last EGD for GERD symptoms was on 04/08/2018 and it was normal.  Past Medical History:  Diagnosis Date   Aneurysm (HCC)    brain   Arthritis    Atrial fibrillation (HCC)    Bipolar disorder (HCC)    was on meds but was taken off 2 yrs ago and none since   Burning pain    in both legs-seeing Dr.Sethi for this   CAD (coronary artery disease)    Cataracts, bilateral    Childhood asthma    Last asthma attack at age 9; History of trach at 16 months (12/17/2016)   Chronic kidney disease 2009   Chronic lower back pain    COPD (chronic obstructive pulmonary disease) (HCC) 06/2012   uses Spiriva  and Albuterol  daily as needed   Depression    Family history of adverse reaction to anesthesia    Mom and sister have PONV   GERD (gastroesophageal reflux disease)    H/O hiatal hernia    Headache(784.0)    q 2-3 weeks; daily last 2 wks (12/17/2016)   Hyperlipidemia    takes Ramipril  daily   Hypertension    takes Ramipril  daily   Joint pain    Joint swelling    Middle cerebral artery aneurysm    right   Nocturia    Numbness    both arms    Pneumonia    hx of-2014   PONV (postoperative nausea and vomiting)    Pt reports nausea only.   Sleep apnea    dx just this past week   08/21/2017   Squamous cell carcinoma of skin    Stroke (HCC) 2015   drag left foot more since; have to wear glasses now (12/17/2016)   Type II diabetes mellitus (HCC)    takes Metformin  daily   dx 2015    Umbilical hernia    Urinary frequency    Wears dentures    Wears glasses    and contact lenses    Past Surgical History:  Procedure Laterality Date   ANEURYSM COILING  2015   CARDIOVERSION N/A 12/18/2016   Procedure: CARDIOVERSION;  Surgeon: Salena Negri, MD;  Location: [facility];  Service: Cardiovascular;  Laterality: N/A;   COLONOSCOPY     COLONOSCOPY N/A 04/08/2018   Procedure: COLONOSCOPY;  Surgeon: Rollin Dover, MD;  Location: [facility];  Service: Endoscopy;  Laterality: N/A;   COLONOSCOPY N/A 10/18/2020   Procedure: COLONOSCOPY;  Surgeon: Rollin Dover, MD;  Location: [facility];  Service: Endoscopy;  Laterality: N/A;   ESOPHAGOGASTRODUODENOSCOPY N/A 04/08/2018   Procedure: ESOPHAGOGASTRODUODENOSCOPY (EGD);  Surgeon: Rollin Dover, MD;  Location: [facility];  Service: Endoscopy;  Laterality: N/A;   EXCISIONAL HEMORRHOIDECTOMY  1980s   soon after rectal OR   GANGLION CYST EXCISION Right    HEMOSTASIS CLIP PLACEMENT  10/18/2020   Procedure: HEMOSTASIS CLIP PLACEMENT;  Surgeon: Rollin Dover, MD;  Location: [facility];  Service:  Endoscopy;;   HOT HEMOSTASIS N/A 04/08/2018   Procedure: HOT HEMOSTASIS (ARGON PLASMA COAGULATION/BICAP);  Surgeon: Rollin Dover, MD;  Location: [facility];  Service: Endoscopy;  Laterality: N/A;   HOT HEMOSTASIS N/A 10/18/2020   Procedure: HOT HEMOSTASIS (ARGON PLASMA COAGULATION/BICAP);  Surgeon: Rollin Dover, MD;  Location: [facility];  Service: Endoscopy;  Laterality: N/A;   IR 3D INDEPENDENT WKST  05/10/2017   IR 3D INDEPENDENT WKST  06/28/2017   IR ANGIO INTRA EXTRACRAN SEL COM CAROTID INNOMINATE BILAT MOD SED  04/22/2017   IR ANGIO INTRA EXTRACRAN SEL COM CAROTID INNOMINATE BILAT MOD SED  11/22/2017   IR ANGIO INTRA EXTRACRAN SEL COM CAROTID INNOMINATE BILAT MOD SED  05/13/2018   IR ANGIO INTRA EXTRACRAN SEL INTERNAL CAROTID UNI L MOD SED  12/27/2017   IR ANGIO INTRA EXTRACRAN SEL INTERNAL CAROTID UNI R MOD SED  05/10/2017   IR ANGIO INTRA  EXTRACRAN SEL INTERNAL CAROTID UNI R MOD SED  06/28/2017   IR ANGIO VERTEBRAL SEL SUBCLAVIAN INNOMINATE UNI L MOD SED  04/22/2017   IR ANGIO VERTEBRAL SEL SUBCLAVIAN INNOMINATE UNI R MOD SED  06/28/2017   IR ANGIO VERTEBRAL SEL SUBCLAVIAN INNOMINATE UNI R MOD SED  11/22/2017   IR ANGIO VERTEBRAL SEL SUBCLAVIAN INNOMINATE UNI R MOD SED  05/13/2018   IR ANGIO VERTEBRAL SEL VERTEBRAL UNI R MOD SED  04/22/2017   IR ANGIOGRAM FOLLOW UP STUDY  05/10/2017   IR ANGIOGRAM FOLLOW UP STUDY  06/28/2017   IR ANGIOGRAM FOLLOW UP STUDY  12/27/2017   IR NEURO EACH ADD'L AFTER BASIC UNI LEFT (MS)  12/27/2017   IR NEURO EACH ADD'L AFTER BASIC UNI RIGHT (MS)  05/10/2017   IR NEURO EACH ADD'L AFTER BASIC UNI RIGHT (MS)  06/28/2017   IR RADIOLOGIST EVAL & MGMT  04/30/2017   IR RADIOLOGIST EVAL & MGMT  06/15/2017   IR RADIOLOGIST EVAL & MGMT  07/13/2017   IR RADIOLOGIST EVAL & MGMT  01/11/2018   IR TRANSCATH/EMBOLIZ  05/10/2017   IR TRANSCATH/EMBOLIZ  06/28/2017   IR TRANSCATH/EMBOLIZ  12/27/2017   MULTIPLE TOOTH EXTRACTIONS     POLYPECTOMY  04/08/2018   Procedure: POLYPECTOMY;  Surgeon: Rollin Dover, MD;  Location: [facility];  Service: Endoscopy;;   RADIOLOGY WITH ANESTHESIA N/A 08/08/2014   Procedure: RADIOLOGY WITH ANESTHESIA EMBOLIZATION;  Surgeon: Medication Radiologist, MD;  Location: [facility];  Service: Radiology;  Laterality: N/A;   RADIOLOGY WITH ANESTHESIA N/A 11/21/2014   Procedure: RADIOLOGY WITH ANESTHESIA;  Surgeon: Thyra MARLA Nash, MD;  Location: [facility];  Service: Radiology;  Laterality: N/A;   RADIOLOGY WITH ANESTHESIA N/A 02/27/2015   Procedure: RADIOLOGY WITH ANESTHESIA;  Surgeon: Thyra Nash, MD;  Location: [facility];  Service: Radiology;  Laterality: N/A;   RADIOLOGY WITH ANESTHESIA N/A 09/25/2015   Procedure: RADIOLOGY WITH ANESTHESIA;  Surgeon: Thyra Nash, MD;  Location: [facility];  Service: Radiology;  Laterality: N/A;   RADIOLOGY WITH ANESTHESIA N/A 05/10/2017   Procedure: EMBOLIZATION;  Surgeon:  Nash Thyra, MD;  Location: [facility];  Service: Radiology;  Laterality: N/A;   RADIOLOGY WITH ANESTHESIA N/A 06/28/2017   Procedure: RADIOLOGY WITH ANESTHESIA-EMBOLIZATION;  Surgeon: Nash Thyra, MD;  Location: [facility];  Service: Radiology;  Laterality: N/A;   RADIOLOGY WITH ANESTHESIA N/A 08/26/2017   Procedure: RADIOLOGY WITH ANESTHESIA EMBOLIZATION;  Surgeon: Nash Thyra, MD;  Location: [facility];  Service: Radiology;  Laterality: N/A;   RADIOLOGY WITH ANESTHESIA N/A 11/22/2017   Procedure: EMBOLIZATION;  Surgeon: Nash Thyra, MD;  Location: [facility]  OR;  Service: Radiology;  Laterality: N/A;   RADIOLOGY WITH ANESTHESIA N/A 12/27/2017   Procedure: EMBOLIZATION;  Surgeon: Dolphus Carrion, MD;  Location: [facility] OR;  Service: Radiology;  Laterality: N/A;   RECTAL SURGERY  1980s   tore it lifting heavy furniture; sewed it back together   SHOULDER ARTHROSCOPY WITH OPEN ROTATOR CUFF REPAIR Right 2016   SHOULDER ARTHROSCOPY WITH ROTATOR CUFF REPAIR Left 1996   SHOULDER OPEN ROTATOR CUFF REPAIR Left 1997   took out 8inches of my collarbone   TEE WITHOUT CARDIOVERSION N/A 12/18/2016   Procedure: TRANSESOPHAGEAL ECHOCARDIOGRAM (TEE);  Surgeon: Salena Negri, MD;  Location: [facility] ENDOSCOPY;  Service: Cardiovascular;  Laterality: N/A;   TRACHEOSTOMY  1964   at age 23 months old; for asthma   TRACHEOSTOMY CLOSURE     VENTRAL HERNIA REPAIR N/A 09/24/2020   Procedure: LAPAROSCOPIC VENTRAL WALL HERNIA REPAIR WITH MESH;  Surgeon: Sheldon Standing, MD;  Location: [facility] ORS;  Service: General;  Laterality: N/A;    Family History  Problem Relation Age of Onset   Heart disease Mother        s/p 3V CABG   Cancer Father 30       lung cancer; +tobacco   Stroke Father    Cancer Maternal Grandmother    Heart disease Maternal Grandmother    Cancer Paternal Grandmother    Lupus Sister    Multiple sclerosis Sister    Anemia Daughter     Social History:  reports that he quit smoking about 9 years ago.  His smoking use included cigarettes. He started smoking about 49 years ago. He has a 60 pack-year smoking history. He has never been exposed to tobacco smoke. He has never used smokeless tobacco. He reports current alcohol use of about 3.0 standard drinks of alcohol per week. He reports that he does not use drugs.  Allergies:  Allergies  Allergen Reactions   Tylox [Oxycodone -Acetaminophen ] Hives, Other (See Comments) and Rash    Sweating, shaking Reaction: Tremors and diaphoresis    Gabapentin      Gained weight and retained fluid    Adhesive [Tape] Rash    States IV tape is fine    Medications: Scheduled: Continuous:  sodium chloride       No results found for this or any previous visit (from the past 24 hours).   No results found.  ROS:  As stated above in the [name] otherwise negative.  There were no vitals taken for this visit.    PE: Gen: NAD, Alert and Oriented HEENT:  [place]/AT, EOMI Neck: Supple, no LAD Lungs: CTA Bilaterally CV: RRR without M/G/R ABD: Soft, NTND, +BS Ext: No C/C/E  Assessment/Plan: 1) Dysphagia - EGD with dilation. 2) Noncardiac chest pain.  [name] D 04/21/2024, 8:47 AM

## 2024-04-21 NOTE — Transfer of Care (Signed)
 Immediate Anesthesia Transfer of Care Note  Patient: [name]  Procedure(s) Performed: EGD (ESOPHAGOGASTRODUODENOSCOPY) Balloon dilation wire-guided  Patient Location: PACU  Anesthesia Type:MAC  Level of Consciousness: drowsy and responds to stimulation  Airway & Oxygen Therapy: Patient Spontanous Breathing and Patient connected to face mask oxygen  Post-op Assessment: Report given to RN and Post -op Vital signs reviewed and stable  Post vital signs: Reviewed and stable  Last Vitals:  Vitals Value Taken Time  BP    Temp    Pulse 57 04/21/24 10:05  Resp 17 04/21/24 10:05  SpO2 99 % 04/21/24 10:05  Vitals shown include unfiled device data.  Last Pain:  Vitals:   04/21/24 0856  TempSrc: Temporal  PainSc: 0-No pain         Complications: No notable events documented.

## 2024-04-21 NOTE — Op Note (Signed)
 [facility] Patient Name: Unknown Schleyer Procedure Date: 04/21/2024 MRN: [id number] Attending MD: Belvie Just , MD, [id number] Date of Birth: Apr 10, 1961 CSN: [id number] Age: 63 Admit Type: Outpatient Procedure:                Upper GI endoscopy Indications:              Functional Dyspepsia, Chest pain (non cardiac) Providers:                Belvie Just, MD, Randall Lines, RN, Lorrayne Kitty,                            Technician Referring MD:              Medicines:                Propofol  per Anesthesia Complications:            No immediate complications. Estimated Blood Loss:     Estimated blood loss: none. Procedure:                Pre-Anesthesia Assessment:                           - Prior to the procedure, a History and Physical                            was performed, and patient medications and                            allergies were reviewed. The patient's tolerance of                            previous anesthesia was also reviewed. The risks                            and benefits of the procedure and the sedation                            options and risks were discussed with the patient.                            All questions were answered, and informed consent                            was obtained. Prior Anticoagulants: The patient has                            taken Eliquis  (apixaban ), last dose was 4 days                            prior to procedure. ASA Grade Assessment: III - A                            patient with severe systemic disease. After  reviewing the risks and benefits, the patient was                            deemed in satisfactory condition to undergo the                            procedure.                           - Sedation was administered by an anesthesia                            professional. Deep sedation was attained.                           After obtaining informed consent, the endoscope  was                            passed under direct vision. Throughout the                            procedure, the patient's blood pressure, pulse, and                            oxygen saturations were monitored continuously. The                            GIF-H190 ([id number]) Olympus endoscope was introduced                            through the mouth, and advanced to the second part                            of duodenum. The upper GI endoscopy was                            accomplished without difficulty. The patient                            tolerated the procedure well. Scope In: Scope Out: Findings:      No endoscopic abnormality was evident in the esophagus to explain the       patient's complaint of dysphagia. It was decided, however, to proceed       with dilation of the entire esophagus. A guidewire was placed and the       scope was withdrawn. Dilation was performed with a Savary dilator with       no resistance at 18 mm. The dilation site was examined following       endoscope reinsertion and showed no change.      LA Grade B (one or more mucosal breaks greater than 5 mm, not extending       between the tops of two mucosal folds) esophagitis with no bleeding was       found in the lower third of the esophagus.      The stomach was normal.  The examined duodenum was normal. Impression:               - No endoscopic esophageal abnormality to explain                            patient's dysphagia. Esophagus dilated. Dilated.                           - LA Grade B reflux esophagitis with no bleeding.                           - Normal stomach.                           - Normal examined duodenum.                           - No specimens collected. Moderate Sedation:      Not Applicable - Patient had care per Anesthesia. Recommendation:           - Patient has a contact number available for                            emergencies. The signs and symptoms of potential                             delayed complications were discussed with the                            patient. Return to normal activities tomorrow.                            Written discharge instructions were provided to the                            patient.                           - Resume regular diet.                           - Continue present medications.                           - Return to GI clinic in 1 month.                           - PPI QD.                           - Resume Eliquis  and Plavix . Procedure Code(s):        --- Professional ---                           [phone], Esophagogastroduodenoscopy, flexible,  transoral; with insertion of guide wire followed by                            passage of dilator(s) through esophagus over guide                            wire Diagnosis Code(s):        --- Professional ---                           R13.10, Dysphagia, unspecified                           K21.00, Gastro-esophageal reflux disease with                            esophagitis, without bleeding                           K30, Functional dyspepsia                           R07.89, Other chest pain CPT copyright 2022 American Medical Association. All rights reserved. The codes documented in this report are preliminary and upon coder review may  be revised to meet current compliance requirements. Belvie Just, MD Belvie Just, MD 04/21/2024 10:24:59 AM This report has been signed electronically. Number of Addenda: 0

## 2024-06-07 ENCOUNTER — Telehealth: Payer: Self-pay

## 2024-06-07 NOTE — Telephone Encounter (Unsigned)
 Copied from CRM #[id number]. Topic: Appointments - Scheduling Inquiry for Clinic >> Jun 07, 2024  9:03 AM Corean SAUNDERS wrote: Reason for CRM: [id number] made multiple attempt to contact patient and patients sister to update on new  appointment time of 9/22 at 10 AM with Dr. Theodoro (left voicemail) if patient or sister returns call please advise and let them know CAL supervisor Lebron is working to try to get a sooner appointment for the patient as he been diagnosed with late stage cancer.

## 2024-06-08 NOTE — Telephone Encounter (Signed)
 ATC pt X2. Unable to lvm as the line is busy or down. I did try to call the Pt's daughter, Rosina [facility]) to inform her but I had to lvm for her to call us  back when she could.

## 2024-06-09 NOTE — Telephone Encounter (Signed)
 ATC pt X2. Unable to lvm as line is busy or down. I also tried calling his daughter, Rosina [facility]) Which I did leave a vm for a return call back or for them to check their mychart messages. Completing note per protocol.

## 2024-06-13 ENCOUNTER — Encounter: Admitting: Nurse Practitioner

## 2024-07-03 ENCOUNTER — Other Ambulatory Visit: Payer: Self-pay

## 2024-07-03 ENCOUNTER — Ambulatory Visit (INDEPENDENT_AMBULATORY_CARE_PROVIDER_SITE_OTHER)

## 2024-07-03 ENCOUNTER — Inpatient Hospital Stay
Admission: RE | Admit: 2024-07-03 | Discharge: 2024-07-03 | Disposition: A | Discharge status: Home / Self Care | Payer: Self-pay | Source: Ambulatory Visit

## 2024-07-03 ENCOUNTER — Telehealth: Payer: Self-pay

## 2024-07-03 ENCOUNTER — Ambulatory Visit

## 2024-07-03 VITALS — BP 110/74 | HR 99 | Ht 68.0 in | Wt 262.0 lb

## 2024-07-03 DIAGNOSIS — J449 Chronic obstructive pulmonary disease, unspecified: Secondary | ICD-10-CM

## 2024-07-03 DIAGNOSIS — J4489 Other specified chronic obstructive pulmonary disease: Secondary | ICD-10-CM

## 2024-07-03 DIAGNOSIS — Z23 Encounter for immunization: Secondary | ICD-10-CM

## 2024-07-03 DIAGNOSIS — J45909 Unspecified asthma, uncomplicated: Secondary | ICD-10-CM

## 2024-07-03 DIAGNOSIS — R911 Solitary pulmonary nodule: Secondary | ICD-10-CM

## 2024-07-03 DIAGNOSIS — G4733 Obstructive sleep apnea (adult) (pediatric): Secondary | ICD-10-CM | POA: Diagnosis not present

## 2024-07-03 DIAGNOSIS — J439 Emphysema, unspecified: Secondary | ICD-10-CM

## 2024-07-03 MED ORDER — BREZTRI AEROSPHERE 160-9-4.8 MCG/ACT IN AERO: 2.0000 | INHALATION_SPRAY | Freq: Two times a day (BID) | RESPIRATORY_TRACT | 5 refills | Status: AC

## 2024-07-03 NOTE — Assessment & Plan Note (Addendum)
 Labelled as lung RADS 2.  However there is a growing nodule.  With a growing nodule it changes to lung RADS 4A.  I do not have prior CT scan to evaluate the change. Please get a repeat CT scan in 3 months.

## 2024-07-03 NOTE — Assessment & Plan Note (Signed)
 Obesity class II.  Will request primary care provider to help with weight loss.

## 2024-07-03 NOTE — Progress Notes (Deleted)
 New Patient Pulmonology Office Visit   Subjective:  Patient ID: [name], male    DOB: [birth year]  MRN: [id number]  Referred by: Ivan Lenis, MD  CC:  Chief Complaint  Patient presents with   Consult    Pt states he is here because his PCP     HPI [name] is a 63 y.o. male with CVA, DM 2, hypertension, paroxysmal atrial fibrillation, COPD, OSA on CPAP ***, GERD presents for follow-up for COPD.  Was following Dr. Meade.  On Breztri  and albuterol  as needed.   Lung Health: Functional status: *** Covid vaccine: *** Influenza vaccine: *** Pneumonococcal vaccine: *** Smoking: *** Occupational exposure/pets:   {PULM QUESTIONNAIRES (Optional):33196}  Allergies: Tylox [oxycodone -acetaminophen ], Gabapentin , and Adhesive [tape]  Current Outpatient Medications:    albuterol  (PROVENTIL  HFA;VENTOLIN  HFA) 108 (90 BASE) MCG/ACT inhaler, Inhale 2 puffs into the lungs every 6 (six) hours as needed for wheezing or shortness of breath. , Disp: , Rfl:    amiodarone  (PACERONE ) 100 MG tablet, Take 100 mg by mouth daily., Disp: , Rfl:    apixaban  (ELIQUIS ) 5 MG TABS tablet, Take 1 tablet (5 mg total) by mouth 2 (two) times daily., Disp: 60 tablet, Rfl:    Budeson-Glycopyrrol-Formoterol (BREZTRI  AEROSPHERE) 160-9-4.8 MCG/ACT AERO, Inhale 2 puffs into the lungs in the morning and at bedtime., Disp: 1 each, Rfl: 5   clopidogrel  (PLAVIX ) 75 MG tablet, Take 0.5 tablets (37.5 mg total) by mouth daily. (Patient taking differently: Take 37.5 mg by mouth in the morning and at bedtime.), Disp: , Rfl:    esomeprazole (NEXIUM) 40 MG capsule, Take 40 mg by mouth daily., Disp: , Rfl:    fluocinonide  cream (LIDEX ) 0.05 %, Apply 1 Application topically 2 (two) times daily., Disp: 30 g, Rfl: 2   furosemide  (LASIX ) 40 MG tablet, Take 40 mg by mouth 2 (two) times daily., Disp: , Rfl:    KLOR-CON  M10 10 MEQ tablet, Take 10 mEq by mouth daily., Disp: , Rfl:    nitroGLYCERIN  (NITROSTAT ) 0.4 MG SL tablet,  Place 0.4 mg under the tongue every 5 (five) minutes as needed for chest pain., Disp: , Rfl:    oxyCODONE  (OXY IR/ROXICODONE ) 5 MG immediate release tablet, Take 1 tablet (5 mg total) by mouth every 4 (four) hours as needed for up to 8 doses for severe pain (pain score 7-10)., Disp: 8 tablet, Rfl: 0   pantoprazole  (PROTONIX ) 40 MG tablet, Take 1 tablet (40 mg total) by mouth daily as needed (for heartburn). (Patient not taking: Reported on 05/30/2023), Disp: 30 tablet, Rfl: 3   phentermine 30 MG capsule, Take 30 mg by mouth daily., Disp: , Rfl:    potassium chloride  (KLOR-CON ) 10 MEQ tablet, Take 2 tablets (20 mEq total) by mouth daily., Disp: 10 tablet, Rfl: 0 Past Medical History:  Diagnosis Date   Aneurysm (HCC)    brain   Arthritis    Atrial fibrillation (HCC)    Bipolar disorder (HCC)    was on meds but was taken off 2 yrs ago and none since   Burning pain    in both legs-seeing Dr.Sethi for this   CAD (coronary artery disease)    Cataracts, bilateral    Childhood asthma    Last asthma attack at age 87; History of trach at 16 months (12/17/2016)   Chronic kidney disease 2009   Chronic lower back pain    COPD (chronic obstructive pulmonary disease) (HCC) 06/2012   uses Spiriva  and Albuterol  daily  as needed   Depression    Family history of adverse reaction to anesthesia    Mom and sister have PONV   GERD (gastroesophageal reflux disease)    H/O hiatal hernia    Headache(784.0)    q 2-3 weeks; daily last 2 wks (12/17/2016)   Hyperlipidemia    takes Ramipril  daily   Hypertension    takes Ramipril  daily   Joint pain    Joint swelling    Middle cerebral artery aneurysm    right   Nocturia    Numbness    both arms    Pneumonia    hx of-2014   PONV (postoperative nausea and vomiting)    Pt reports nausea only.   Sleep apnea    dx just this past week   08/21/2017   Squamous cell carcinoma of skin    Stroke (HCC) 2015   drag left foot more since; have to wear glasses now  (12/17/2016)   Type II diabetes mellitus (HCC)    takes Metformin  daily   dx 2015   Umbilical hernia    Urinary frequency    Wears dentures    Wears glasses    and contact lenses   Past Surgical History:  Procedure Laterality Date   ANEURYSM COILING  2015   CARDIOVERSION N/A 12/18/2016   Procedure: CARDIOVERSION;  Surgeon: Salena Negri, MD;  [facility]: [facility];  Service: Cardiovascular;  Laterality: N/A;   COLONOSCOPY     COLONOSCOPY N/A 04/08/2018   Procedure: COLONOSCOPY;  Surgeon: Rollin Dover, MD;  Location: [facility];  Service: Endoscopy;  Laterality: N/A;   COLONOSCOPY N/A 10/18/2020   Procedure: COLONOSCOPY;  Surgeon: Rollin Dover, MD;  [facility]: [facility];  Service: Endoscopy;  Laterality: N/A;   ESOPHAGOGASTRODUODENOSCOPY N/A 04/08/2018   Procedure: ESOPHAGOGASTRODUODENOSCOPY (EGD);  Surgeon: Rollin Dover, MD;  Location: [facility];  Service: Endoscopy;  Laterality: N/A;   ESOPHAGOGASTRODUODENOSCOPY N/A 04/21/2024   Procedure: EGD (ESOPHAGOGASTRODUODENOSCOPY);  Surgeon: Rollin Dover, MD;  Location: [facility];  Service: Gastroenterology;  Laterality: N/A;   EXCISIONAL HEMORRHOIDECTOMY  1980s   soon after rectal OR   GANGLION CYST EXCISION Right    HEMOSTASIS CLIP PLACEMENT  10/18/2020   Procedure: HEMOSTASIS CLIP PLACEMENT;  Surgeon: Rollin Dover, MD;  [facility];  Service: Endoscopy;;   HOT HEMOSTASIS N/A 04/08/2018   Procedure: HOT HEMOSTASIS (ARGON PLASMA COAGULATION/BICAP);  Surgeon: Rollin Dover, MD;  Location: [facility];  Service: Endoscopy;  Laterality: N/A;   HOT HEMOSTASIS N/A 10/18/2020   Procedure: HOT HEMOSTASIS (ARGON PLASMA COAGULATION/BICAP);  Surgeon: Rollin Dover, MD;  Location: [facility];  Service: Endoscopy;  Laterality: N/A;   IR 3D INDEPENDENT WKST  05/10/2017   IR 3D INDEPENDENT WKST  06/28/2017   IR ANGIO INTRA EXTRACRAN SEL COM CAROTID INNOMINATE BILAT MOD SED  04/22/2017   IR ANGIO INTRA EXTRACRAN SEL COM CAROTID INNOMINATE  BILAT MOD SED  11/22/2017   IR ANGIO INTRA EXTRACRAN SEL COM CAROTID INNOMINATE BILAT MOD SED  05/13/2018   IR ANGIO INTRA EXTRACRAN SEL INTERNAL CAROTID UNI L MOD SED  12/27/2017   IR ANGIO INTRA EXTRACRAN SEL INTERNAL CAROTID UNI R MOD SED  05/10/2017   IR ANGIO INTRA EXTRACRAN SEL INTERNAL CAROTID UNI R MOD SED  06/28/2017   IR ANGIO VERTEBRAL SEL SUBCLAVIAN INNOMINATE UNI L MOD SED  04/22/2017   IR ANGIO VERTEBRAL SEL SUBCLAVIAN INNOMINATE UNI R MOD SED  06/28/2017   IR ANGIO VERTEBRAL SEL SUBCLAVIAN INNOMINATE UNI R MOD SED  11/22/2017  IR ANGIO VERTEBRAL SEL SUBCLAVIAN INNOMINATE UNI R MOD SED  05/13/2018   IR ANGIO VERTEBRAL SEL VERTEBRAL UNI R MOD SED  04/22/2017   IR ANGIOGRAM FOLLOW UP STUDY  05/10/2017   IR ANGIOGRAM FOLLOW UP STUDY  06/28/2017   IR ANGIOGRAM FOLLOW UP STUDY  12/27/2017   IR NEURO EACH ADD'L AFTER BASIC UNI LEFT (MS)  12/27/2017   IR NEURO EACH ADD'L AFTER BASIC UNI RIGHT (MS)  05/10/2017   IR NEURO EACH ADD'L AFTER BASIC UNI RIGHT (MS)  06/28/2017   IR RADIOLOGIST EVAL & MGMT  04/30/2017   IR RADIOLOGIST EVAL & MGMT  06/15/2017   IR RADIOLOGIST EVAL & MGMT  07/13/2017   IR RADIOLOGIST EVAL & MGMT  01/11/2018   IR TRANSCATH/EMBOLIZ  05/10/2017   IR TRANSCATH/EMBOLIZ  06/28/2017   IR TRANSCATH/EMBOLIZ  12/27/2017   MULTIPLE TOOTH EXTRACTIONS     POLYPECTOMY  04/08/2018   Procedure: POLYPECTOMY;  Surgeon: Rollin Dover, MD;  [facility];  Service: Endoscopy;;   RADIOLOGY WITH ANESTHESIA N/A 08/08/2014   Procedure: RADIOLOGY WITH ANESTHESIA EMBOLIZATION;  Surgeon: Medication Radiologist, MD;  [facility];  Service: Radiology;  Laterality: N/A;   RADIOLOGY WITH ANESTHESIA N/A 11/21/2014   Procedure: RADIOLOGY WITH ANESTHESIA;  Surgeon: Thyra MARLA Nash, MD;  [facility];  Service: Radiology;  Laterality: N/A;   RADIOLOGY WITH ANESTHESIA N/A 02/27/2015   Procedure: RADIOLOGY WITH ANESTHESIA;  Surgeon: Thyra Nash, MD;  [facility];  Service: Radiology;   Laterality: N/A;   RADIOLOGY WITH ANESTHESIA N/A 09/25/2015   Procedure: RADIOLOGY WITH ANESTHESIA;  Surgeon: Thyra Nash, MD;  [facility];  Service: Radiology;  Laterality: N/A;   RADIOLOGY WITH ANESTHESIA N/A 05/10/2017   Procedure: EMBOLIZATION;  Surgeon: Nash Thyra, MD;  [facility];  Service: Radiology;  Laterality: N/A;   RADIOLOGY WITH ANESTHESIA N/A 06/28/2017   Procedure: RADIOLOGY WITH ANESTHESIA-EMBOLIZATION;  Surgeon: Nash Thyra, MD;  [facility];  Service: Radiology;  Laterality: N/A;   RADIOLOGY WITH ANESTHESIA N/A 08/26/2017   Procedure: RADIOLOGY WITH ANESTHESIA EMBOLIZATION;  Surgeon: Nash Thyra, MD;  [facility];  Service: Radiology;  Laterality: N/A;   RADIOLOGY WITH ANESTHESIA N/A 11/22/2017   Procedure: EMBOLIZATION;  Surgeon: Nash Thyra, MD;  [facility];  Service: Radiology;  Laterality: N/A;   RADIOLOGY WITH ANESTHESIA N/A 12/27/2017   Procedure: EMBOLIZATION;  Surgeon: Nash Thyra, MD;  [facility];  Service: Radiology;  Laterality: N/A;   RECTAL SURGERY  1980s   tore it lifting heavy furniture; sewed it back together   SHOULDER ARTHROSCOPY WITH OPEN ROTATOR CUFF REPAIR Right 2016   SHOULDER ARTHROSCOPY WITH ROTATOR CUFF REPAIR Left 1996   SHOULDER OPEN ROTATOR CUFF REPAIR Left 1997   took out 8inches of my collarbone   TEE WITHOUT CARDIOVERSION N/A 12/18/2016   Procedure: TRANSESOPHAGEAL ECHOCARDIOGRAM (TEE);  Surgeon: Salena Negri, MD;  Location: [facility];  Service: Cardiovascular;  Laterality: N/A;   TRACHEOSTOMY  1964   at age 51 months old; for asthma   TRACHEOSTOMY CLOSURE     VENTRAL HERNIA REPAIR N/A 09/24/2020   Procedure: LAPAROSCOPIC VENTRAL WALL HERNIA REPAIR WITH MESH;  Surgeon: Sheldon Standing, MD;  [facility];  Service: General;  Laterality: N/A;   Family History  Problem Relation Age of Onset   Heart disease Mother        s/p 3V CABG   Cancer Father 56       lung  cancer; +tobacco  Stroke Father    Cancer Maternal Grandmother    Heart disease Maternal Grandmother    Cancer Paternal Grandmother    Lupus Sister    Multiple sclerosis Sister    Anemia Daughter    Social History   Socioeconomic History   Marital status: Divorced    Spouse name: not together since 2007   Number of children: 3   Years of education: 10   Highest education level: Not on file  Occupational History   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   Tobacco Use   Smoking status: Former    Current packs/day: 0.00    Average packs/day: 1.5 packs/day for 40.0 years (60.0 ttl pk-yrs)    Types: Cigarettes    Start date: 01/15/1975    Quit date: 06/02/2014    Years since quitting: 10.0    Passive exposure: Never   Smokeless tobacco: Never  Vaping Use   Vaping status: Former  Substance and Sexual Activity   Alcohol use: Yes    Alcohol/week: 3.0 standard drinks of alcohol    Types: 3 Cans of beer per week    Comment: drinks 3 - 12oz bottles every monday, during shooting pool   Drug use: No   Sexual activity: Never    Partners: Female    Comment: partner has had surgery to prevent pregnancy  Other Topics Concern   Not on file  Social History Narrative   Lives with his son and his mother.  His son has no contact with the son's mother, though the patient is not legally separated from her.  She has a history of drug use and has been in prison several times.   Social Drivers of Corporate investment banker Strain: Low Risk  (11/17/2023)   Received from [facility]   Overall Financial Resource Strain (CARDIA)    Difficulty of Paying Living Expenses: Not hard at all  Food Insecurity: No Food Insecurity (11/17/2023)   Received from [facility]   Hunger Vital Sign    Within the past 12 months, you worried that your food would run out before you got the money to buy more.: Never true    Within the past 12 months, the food you bought just didn't  last and you didn't have money to get more.: Never true  Transportation Needs: No Transportation Needs (11/17/2023)   Received from [facility] - Transportation    Lack of Transportation (Medical): No    Lack of Transportation (Non-Medical): No  Physical Activity: Sufficiently Active (11/17/2023)   Received from [facility]   Exercise Vital Sign    On average, how many days per week do you engage in moderate to strenuous exercise (like a brisk walk)?: 2 days    On average, how many minutes do you engage in exercise at this level?: 150+ min  Recent Concern: Physical Activity - Insufficiently Active (10/01/2023)   Received from [facility]   Exercise Vital Sign    Days of Exercise per Week: 4 days    Minutes of Exercise per Session: 30 min  Stress: No Stress Concern Present (02/08/2024)   Received from [facility] [place] of Occupational Health - Occupational Stress Questionnaire    Feeling of Stress : Not at all  Recent Concern: Stress - Stress Concern Present (11/17/2023)   Received from [facility] of Occupational [facility] - Occupational Stress Questionnaire  Feeling of Stress : Very much  Social Connections: Socially Integrated (11/17/2023)   Received from [facility]   Social Network    How would you rate your social network (family, work, friends)?: Good participation with social networks  Intimate Partner Violence: Not At Risk (02/08/2024)   Received from [facility]   HITS    Over the last 12 months how often did your partner physically hurt you?: Never    Over the last 12 months how often did your partner insult you or talk down to you?: Never    Over the last 12 months how often did your partner threaten you with physical harm?: Never    Over the last 12 months how often did your partner scream or curse at you?: Never     Objective:  There were no vitals taken for this visit. {Pulm Vitals (Optional):32837}  General:  Distress/not in distress patient appearing ill/healthy Lungs: clear to auscultation bilaterally.  Heart: regular rate rhythm, no murmur appreciated.  Abdomen: non tender, non distended. Normal BS.  Neuro: axox***.  ***   Diagnostic Review:  {Labs (Optional):32838}   Assessment & Plan:   Assessment & Plan   No orders of the defined types were placed in this encounter.     No follow-ups on file.   Monifa Blanchette, MD

## 2024-07-03 NOTE — Assessment & Plan Note (Signed)
 Sleep study does not show obstruction.labeled as COPD.  On Breztri .  Does not appear to be in exacerbation but does have worsening cough.  Lungs clear. Continue Breztri .  As needed albuterol .  Advised to stop Anoro which he was using as well. Currently having worsening cough but breathing is almost at baseline. -Sleep study -Chest x-ray -Flu vaccine.  Up-to-date with pneumonia vaccine.

## 2024-07-03 NOTE — Telephone Encounter (Signed)
 Received CT DVD from pt at his office visit on 07/03/24. After being reviewed by Dr. Theodoro, DVD was placed in the outgoing mail for [facility].  DVD had pt's name, DOB, and MRN. Pt does not need DVD back.

## 2024-07-03 NOTE — Patient Instructions (Signed)
 Notification of test results are managed in the following manner: If there are any recommendations or changes to the plan of care discussed in office today, we will contact you and let you know what they are. If you do not hear from us , then your results are normal/expected and you can view them through your MyChart account, or a letter will be sent to you. Thank you again for trusting us  with your care [facility].

## 2024-07-03 NOTE — Progress Notes (Signed)
 New Patient Pulmonology Office Visit   Subjective:  Patient ID: [name], male    DOB: 01-10-1961  MRN: [id number]  Referred by: [name], MD  CC:  Chief Complaint  Patient presents with   Consult    Pt states he is here because of lung nodules found on CT scan.  Pt states he has SOB all the time - is out of breath walking to the bathroom and back.     HPI [name] is a 63 y.o. male with CVA, DM 2, hypertension, paroxysmal atrial fibrillation, Asthma/COPD, OSA on CPAP but now not using it, GERD presents for follow-up for COPD.   Was following Dr. Meade.  On Breztri  and albuterol  as needed. Also was added on anoro ellipta  by some provider. Breathing is ok. However PFT w/o obstruction.  Asthma as a kid. Had an asthma exacerbation as a kid needing trach as kid.  Using albuterol  once every day.  Allergies from seasonal change triggers breathing. Cough 5 days w thick white mucus. Breathing worse than usual.    Had LDCT done 06/02/24: showing multiple lung nodule ~ 4mm. Per report 4mm subpleural solid pulm nodule in LUL which has minimally grown compared to CT from 2024. Per radiology report labelled as Lung RADS cat 2. Reviewed the images but I do not have prior Ct scan to compare.   OSA: Dx  Sent back machine in 2024. Did not register the machine on [facility] website. DME unsure.  Now is snoring, does not sleep well and wake up multiple times.   Lung Health: Functional status: 1/2 block.  Smoking: 60 pk yr quit in 2015.  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 1 dog.   Testing: Had LDCT done 06/02/24: showing multiple lung nodule ~ 4mm. Per report 4mm subpleural solid pulm nodule in LUL which has minimally grown compared to CT from 2024. Per radiology report labelled as Lung RADS cat 2. Reviewed the images but I do not have prior Ct scan to compare.   PFT 9/23: TLC normal w reduced FEV1 and  FVC and normal ratio. DLCO normal.   2018: in lab PSG showing mod OSA w AHI 17.4, supine dominant, hypoxia for 58 min w nadir 68%. Titration study recommended.  Follow up titration study recommended CPAP `12 cm h2o.    Allergies: Tylox [oxycodone -acetaminophen ], Gabapentin , and Adhesive [tape]  History: Past Medical History:  Diagnosis Date   Aneurysm (HCC)    brain   Arthritis    Atrial fibrillation (HCC)    Bipolar disorder (HCC)    was on meds but was taken off 2 yrs ago and none since   Burning pain    in both legs-seeing Dr.Sethi for this   CAD (coronary artery disease)    Cataracts, bilateral    Childhood asthma    Last asthma attack at age 3; History of trach at 16 months (12/17/2016)   Chronic kidney disease 2009   Chronic lower back pain    COPD (chronic obstructive pulmonary disease) (HCC) 06/2012   uses Spiriva  and Albuterol  daily as needed   Depression    Family history of adverse reaction to anesthesia    Mom and sister have PONV   GERD (gastroesophageal reflux disease)    H/O hiatal hernia    Headache(784.0)    q 2-3 weeks; daily last 2 wks (12/17/2016)   Hyperlipidemia    takes Ramipril  daily  Hypertension    takes Ramipril  daily   Joint pain    Joint swelling    Middle cerebral artery aneurysm    right   Nocturia    Numbness    both arms    Pneumonia    hx of-2014   PONV (postoperative nausea and vomiting)    Pt reports nausea only.   Sleep apnea    dx just this past week   08/21/2017   Squamous cell carcinoma of skin    Stroke (HCC) 2015   drag left foot more since; have to wear glasses now (12/17/2016)   Type II diabetes mellitus (HCC)    takes Metformin  daily   dx 2015   Umbilical hernia    Urinary frequency    Wears dentures    Wears glasses    and contact lenses   Past Surgical History:  Procedure Laterality Date   ANEURYSM COILING  2015   CARDIOVERSION N/A 12/18/2016   Procedure: CARDIOVERSION;  Surgeon: Salena Negri, MD;   [facility]: [facility];  Service: Cardiovascular;  Laterality: N/A;   COLONOSCOPY     COLONOSCOPY N/A 04/08/2018   Procedure: COLONOSCOPY;  Surgeon: Rollin Dover, MD;  Location: [facility];  Service: Endoscopy;  Laterality: N/A;   COLONOSCOPY N/A 10/18/2020   Procedure: COLONOSCOPY;  Surgeon: Rollin Dover, MD;  Location: [facility];  Service: Endoscopy;  Laterality: N/A;   ESOPHAGOGASTRODUODENOSCOPY N/A 04/08/2018   Procedure: ESOPHAGOGASTRODUODENOSCOPY (EGD);  Surgeon: Rollin Dover, MD;  Location: [facility];  Service: Endoscopy;  Laterality: N/A;   ESOPHAGOGASTRODUODENOSCOPY N/A 04/21/2024   Procedure: EGD (ESOPHAGOGASTRODUODENOSCOPY);  Surgeon: Rollin Dover, MD;  Location: [facility];  Service: Gastroenterology;  Laterality: N/A;   EXCISIONAL HEMORRHOIDECTOMY  1980s   soon after rectal OR   GANGLION CYST EXCISION Right    HEMOSTASIS CLIP PLACEMENT  10/18/2020   Procedure: HEMOSTASIS CLIP PLACEMENT;  Surgeon: Rollin Dover, MD;  [facility]: [facility];  Service: Endoscopy;;   HOT HEMOSTASIS N/A 04/08/2018   Procedure: HOT HEMOSTASIS (ARGON PLASMA COAGULATION/BICAP);  Surgeon: Rollin Dover, MD;  Location: [facility];  Service: Endoscopy;  Laterality: N/A;   HOT HEMOSTASIS N/A 10/18/2020   Procedure: HOT HEMOSTASIS (ARGON PLASMA COAGULATION/BICAP);  Surgeon: Rollin Dover, MD;  Location: [facility];  Service: Endoscopy;  Laterality: N/A;   IR 3D INDEPENDENT WKST  05/10/2017   IR 3D INDEPENDENT WKST  06/28/2017   IR ANGIO INTRA EXTRACRAN SEL COM CAROTID INNOMINATE BILAT MOD SED  04/22/2017   IR ANGIO INTRA EXTRACRAN SEL COM CAROTID INNOMINATE BILAT MOD SED  11/22/2017   IR ANGIO INTRA EXTRACRAN SEL COM CAROTID INNOMINATE BILAT MOD SED  05/13/2018   IR ANGIO INTRA EXTRACRAN SEL INTERNAL CAROTID UNI L MOD SED  12/27/2017   IR ANGIO INTRA EXTRACRAN SEL INTERNAL CAROTID UNI R MOD SED  05/10/2017   IR ANGIO INTRA EXTRACRAN SEL INTERNAL CAROTID UNI R MOD SED  06/28/2017   IR ANGIO VERTEBRAL SEL  SUBCLAVIAN INNOMINATE UNI L MOD SED  04/22/2017   IR ANGIO VERTEBRAL SEL SUBCLAVIAN INNOMINATE UNI R MOD SED  06/28/2017   IR ANGIO VERTEBRAL SEL SUBCLAVIAN INNOMINATE UNI R MOD SED  11/22/2017   IR ANGIO VERTEBRAL SEL SUBCLAVIAN INNOMINATE UNI R MOD SED  05/13/2018   IR ANGIO VERTEBRAL SEL VERTEBRAL UNI R MOD SED  04/22/2017   IR ANGIOGRAM FOLLOW UP STUDY  05/10/2017   IR ANGIOGRAM FOLLOW UP STUDY  06/28/2017   IR ANGIOGRAM FOLLOW UP STUDY  12/27/2017   IR NEURO EACH ADD'L AFTER  BASIC UNI LEFT (MS)  12/27/2017   IR NEURO EACH ADD'L AFTER BASIC UNI RIGHT (MS)  05/10/2017   IR NEURO EACH ADD'L AFTER BASIC UNI RIGHT (MS)  06/28/2017   IR RADIOLOGIST EVAL & MGMT  04/30/2017   IR RADIOLOGIST EVAL & MGMT  06/15/2017   IR RADIOLOGIST EVAL & MGMT  07/13/2017   IR RADIOLOGIST EVAL & MGMT  01/11/2018   IR TRANSCATH/EMBOLIZ  05/10/2017   IR TRANSCATH/EMBOLIZ  06/28/2017   IR TRANSCATH/EMBOLIZ  12/27/2017   MULTIPLE TOOTH EXTRACTIONS     POLYPECTOMY  04/08/2018   Procedure: POLYPECTOMY;  Surgeon: Rollin Dover, MD;  [facility];  Service: Endoscopy;;   RADIOLOGY WITH ANESTHESIA N/A 08/08/2014   Procedure: RADIOLOGY WITH ANESTHESIA EMBOLIZATION;  Surgeon: Medication Radiologist, MD;  [facility];  Service: Radiology;  Laterality: N/A;   RADIOLOGY WITH ANESTHESIA N/A 11/21/2014   Procedure: RADIOLOGY WITH ANESTHESIA;  Surgeon: Thyra MARLA Nash, MD;  [facility];  Service: Radiology;  Laterality: N/A;   RADIOLOGY WITH ANESTHESIA N/A 02/27/2015   Procedure: RADIOLOGY WITH ANESTHESIA;  Surgeon: Thyra Nash, MD;  [facility];  Service: Radiology;  Laterality: N/A;   RADIOLOGY WITH ANESTHESIA N/A 09/25/2015   Procedure: RADIOLOGY WITH ANESTHESIA;  Surgeon: Thyra Nash, MD;  [facility];  Service: Radiology;  Laterality: N/A;   RADIOLOGY WITH ANESTHESIA N/A 05/10/2017   Procedure: EMBOLIZATION;  Surgeon: Nash Thyra, MD;  [facility];  Service: Radiology;  Laterality: N/A;    RADIOLOGY WITH ANESTHESIA N/A 06/28/2017   Procedure: RADIOLOGY WITH ANESTHESIA-EMBOLIZATION;  Surgeon: Nash Thyra, MD;  [facility];  Service: Radiology;  Laterality: N/A;   RADIOLOGY WITH ANESTHESIA N/A 08/26/2017   Procedure: RADIOLOGY WITH ANESTHESIA EMBOLIZATION;  Surgeon: Nash Thyra, MD;  [facility];  Service: Radiology;  Laterality: N/A;   RADIOLOGY WITH ANESTHESIA N/A 11/22/2017   Procedure: EMBOLIZATION;  Surgeon: Nash Thyra, MD;  [facility];  Service: Radiology;  Laterality: N/A;   RADIOLOGY WITH ANESTHESIA N/A 12/27/2017   Procedure: EMBOLIZATION;  Surgeon: Nash Thyra, MD;  [facility];  Service: Radiology;  Laterality: N/A;   RECTAL SURGERY  1980s   tore it lifting heavy furniture; sewed it back together   SHOULDER ARTHROSCOPY WITH OPEN ROTATOR CUFF REPAIR Right 2016   SHOULDER ARTHROSCOPY WITH ROTATOR CUFF REPAIR Left 1996   SHOULDER OPEN ROTATOR CUFF REPAIR Left 1997   took out 8inches of my collarbone   TEE WITHOUT CARDIOVERSION N/A 12/18/2016   Procedure: TRANSESOPHAGEAL ECHOCARDIOGRAM (TEE);  Surgeon: Salena Negri, MD;  Location: [facility] END[facility];  Service: Cardiovascular;  Laterality: N/A;   TRACHEOSTOMY  1964   at age 56 months old; for asthma   TRACHEOSTOMY CLOSURE     VENTRAL HERNIA REPAIR N/A 09/24/2020   Procedure: LAPAROSCOPIC VENTRAL WALL HERNIA REPAIR WITH MESH;  Surgeon: Sheldon Standing, MD;  Location: [facility];  Service: General;  Laterality: N/A;   Family History  Problem Relation Age of Onset   Heart disease Mother        s/p 3V CABG   Cancer Father 58       lung cancer; +tobacco   Stroke Father    Cancer Maternal Grandmother    Heart disease Maternal Grandmother    Cancer Paternal Grandmother    Lupus Sister    Multiple sclerosis Sister    Anemia Daughter    Social History   Socioeconomic History   Marital status: Divorced    Spouse name: not together since 2007  Number of children: 3   Years of  education: 10   Highest education level: Not on file  Occupational History   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   Tobacco Use   Smoking status: Former    Current packs/day: 0.00    Average packs/day: 1.5 packs/day for 40.0 years (60.0 ttl pk-yrs)    Types: Cigarettes    Start date: 01/15/1975    Quit date: 06/02/2014    Years since quitting: 10.0    Passive exposure: Never   Smokeless tobacco: Never   Tobacco comments:    Smoked for about 40 years, smoked 1.5ppd  Vaping Use   Vaping status: Former  Substance and Sexual Activity   Alcohol use: Yes    Alcohol/week: 3.0 standard drinks of alcohol    Types: 3 Cans of beer per week    Comment: drinks 3 - 12oz bottles every monday, during shooting pool   Drug use: No   Sexual activity: Never    Partners: Female    Comment: partner has had surgery to prevent pregnancy  Other Topics Concern   Not on file  Social History Narrative   Lives with his son and his mother.  His son has no contact with the son's mother, though the patient is not legally separated from her.  She has a history of drug use and has been in prison several times.   Social Drivers of Corporate investment banker Strain: Low Risk  (11/17/2023)   Received from [facility]   Overall Financial Resource Strain (CARDIA)    Difficulty of Paying Living Expenses: Not hard at all  Food Insecurity: No Food Insecurity (11/17/2023)   Received from [facility]   Hunger Vital Sign    Within the past 12 months, you worried that your food would run out before you got the money to buy more.: Never true    Within the past 12 months, the food you bought just didn't last and you didn't have money to get more.: Never true  Transportation Needs: No Transportation Needs (11/17/2023)   Received from [facility] - Transportation    Lack of Transportation (Medical): No    Lack of Transportation (Non-Medical): No  Physical Activity:  Sufficiently Active (11/17/2023)   Received from [facility]   Exercise Vital Sign    On average, how many days per week do you engage in moderate to strenuous exercise (like a brisk walk)?: 2 days    On average, how many minutes do you engage in exercise at this level?: 150+ min  Recent Concern: Physical Activity - Insufficiently Active (10/01/2023)   Received from [facility]   Exercise Vital Sign    Days of Exercise per Week: 4 days    Minutes of Exercise per Session: 30 min  Stress: No Stress Concern Present (02/08/2024)   Received from [facility] - Occupational Stress Questionnaire    Feeling of Stress : Not at all  Recent Concern: Stress - Stress Concern Present (11/17/2023)   Received from [facility] - Occupational Stress Questionnaire    Feeling of Stress : Very much  Social Connections: Socially Integrated (11/17/2023)   Received from [facility]   Social Network    How would you rate your social network (family, work, friends)?: Good participation with social networks  Intimate Partner Violence: Not At  Risk (02/08/2024)   Received from [facility]   HITS    Over the last 12 months how often did your partner physically hurt you?: Never    Over the last 12 months how often did your partner insult you or talk down to you?: Never    Over the last 12 months how often did your partner threaten you with physical harm?: Never    Over the last 12 months how often did your partner scream or curse at you?: Never    Immunization status: Immunization History  Administered Date(s) Administered   Influenza Inj Mdck Quad Pf 07/18/2021, 06/16/2022   Influenza,inj,Quad PF,6+ Mos 07/05/2014, 08/05/2015, 06/12/2016, 06/29/2017   Influenza-Unspecified 07/05/2014, 08/05/2015, 06/12/2016, 06/29/2017, 06/28/2018, 07/18/2019, 08/16/2020   PFIZER(Purple Top)SARS-COV-2 Vaccination 11/27/2019, 12/19/2019,  08/16/2020   PNEUMOCOCCAL CONJUGATE-20 11/19/2021   Pfizer Covid-19 Vaccine Bivalent Booster 60yrs & up 08/13/2021   Pfizer(Comirnaty)Fall Seasonal Vaccine 12 years and older 10/19/2023   Pneumococcal Polysaccharide-23 07/05/2014, 06/29/2017, 11/07/2018   Tdap 11/21/2010, 05/11/2012, 06/16/2022, 04/16/2024    Current Meds:  Current Outpatient Medications:    albuterol  (PROVENTIL  HFA;VENTOLIN  HFA) 108 (90 BASE) MCG/ACT inhaler, Inhale 2 puffs into the lungs every 6 (six) hours as needed for wheezing or shortness of breath. , Disp: , Rfl:    amiodarone  (PACERONE ) 100 MG tablet, Take 100 mg by mouth daily., Disp: , Rfl:    apixaban  (ELIQUIS ) 5 MG TABS tablet, Take 1 tablet (5 mg total) by mouth 2 (two) times daily., Disp: 60 tablet, Rfl:    clopidogrel  (PLAVIX ) 75 MG tablet, Take 0.5 tablets (37.5 mg total) by mouth daily. (Patient taking differently: Take 37.5 mg by mouth in the morning and at bedtime.), Disp: , Rfl:    esomeprazole (NEXIUM) 40 MG capsule, Take 40 mg by mouth daily., Disp: , Rfl:    fluocinonide  cream (LIDEX ) 0.05 %, Apply 1 Application topically 2 (two) times daily. (Patient taking differently: Apply 1 Application topically as needed.), Disp: 30 g, Rfl: 2   furosemide  (LASIX ) 40 MG tablet, Take 40 mg by mouth 2 (two) times daily., Disp: , Rfl:    KLOR-CON  M10 10 MEQ tablet, Take 10 mEq by mouth daily., Disp: , Rfl:    nitroGLYCERIN  (NITROSTAT ) 0.4 MG SL tablet, Place 0.4 mg under the tongue every 5 (five) minutes as needed for chest pain., Disp: , Rfl:    pantoprazole  (PROTONIX ) 40 MG tablet, Take 1 tablet (40 mg total) by mouth daily as needed (for heartburn)., Disp: 30 tablet, Rfl: 3   potassium chloride  (KLOR-CON ) 10 MEQ tablet, Take 2 tablets (20 mEq total) by mouth daily., Disp: 10 tablet, Rfl: 0   budesonide -glycopyrrolate -formoterol (BREZTRI  AEROSPHERE) 160-9-4.8 MCG/ACT AERO inhaler, Inhale 2 puffs into the lungs in the morning and at bedtime., Disp: 10.7 g, Rfl: 5    oxyCODONE  (OXY IR/ROXICODONE ) 5 MG immediate release tablet, Take 1 tablet (5 mg total) by mouth every 4 (four) hours as needed for up to 8 doses for severe pain (pain score 7-10). (Patient not taking: Reported on 07/03/2024), Disp: 8 tablet, Rfl: 0   phentermine 30 MG capsule, Take 30 mg by mouth daily. (Patient not taking: Reported on 07/03/2024), Disp: , Rfl:       Objective:  BP 110/74   Pulse 99   Ht 5' 8 (1.727 m)   Wt 262 lb (118.8 kg)   SpO2 97% Comment: RA  BMI 39.84 kg/m  BMI Readings from Last 3 Encounters:  07/03/24 39.84 kg/m  04/21/24 36.87 kg/m  04/16/24 36.87 kg/m    General: not in distress patient appearing healthy Lungs: clear to auscultation bilaterally.  Heart: regular rate rhythm, no murmur appreciated.  Abdomen: non tender, non distended. Normal BS.  Neuro: axox3.  Moving all extremiteis.    Diagnostic Review:  Last metabolic panel Lab Results  Component Value Date   GLUCOSE 122 (H) 06/02/2023   NA 139 06/02/2023   K 4.3 06/02/2023   CL 99 06/02/2023   CO2 29 06/02/2023   BUN 10 06/02/2023   CREATININE 0.86 06/02/2023   GFRNONAA >60 06/02/2023   CALCIUM  9.4 06/02/2023   PROT 6.7 06/02/2023   ALBUMIN 3.7 06/02/2023   BILITOT 0.4 06/02/2023   ALKPHOS 72 06/02/2023   AST 21 06/02/2023   ALT 24 06/02/2023   ANIONGAP 11 06/02/2023       Assessment & Plan:   Assessment & Plan Lung nodule Labelled as lung RADS 2.  However there is a growing nodule.  With a growing nodule it changes to lung RADS 4A.  I do not have prior CT scan to evaluate the change. Please get a repeat CT scan in 3 months. COPD with chronic bronchitis and emphysema (HCC)  Uncomplicated asthma, unspecified asthma severity, unspecified whether persistent Sleep study does not show obstruction.labeled as COPD.  On Breztri .  Does not appear to be in exacerbation but does have worsening cough.  Lungs clear. Continue Breztri .  As needed albuterol .  Advised to stop Anoro which he  was using as well. Currently having worsening cough but breathing is almost at baseline. -Sleep study -Chest x-ray -Flu vaccine.  Up-to-date with pneumonia vaccine. OSA (obstructive sleep apnea) Never registered recalled CPAP.  Has not been using it for last 1 to 2 years.  Have symptoms of OSA.  Not sleeping well. -Setting up with DME company to help register CPAP to get it back. -If needed will order another sleep study to get him the machine. Morbid obesity (HCC) Obesity class II.  Will request primary care provider to help with weight loss.  Orders Placed This Encounter  Procedures   DG Chest 2 View   CT Chest Wo Contrast   Flu vaccine trivalent PF, 6mos and older(Flulaval,Afluria,Fluarix,Fluzone)   Ambulatory Referral for DME   Pulmonary Function Test      Return for Sch after CT scan.   Casmere Hollenbeck, MD

## 2024-07-04 ENCOUNTER — Ambulatory Visit: Payer: Self-pay

## 2024-07-04 NOTE — Telephone Encounter (Signed)
 Copied from CRM #[id number]. Topic: General - Other >> Jul 04, 2024  2:57 PM Essie A wrote: Reason for CRM: Rosina from [facility] called regarding an order for a CPAP for the patient.  Please return her call at [phone].  Thanks.

## 2024-07-04 NOTE — Telephone Encounter (Signed)
 [facility] order sent yesterday

## 2024-07-04 NOTE — Telephone Encounter (Signed)
 Order has been sent to [facility]. NFN.

## 2024-07-12 ENCOUNTER — Telehealth: Payer: Self-pay

## 2024-07-12 NOTE — Telephone Encounter (Signed)
 Received CMN from [facility]. Placed into Pawar's sign folder in A Pod. Once signed, will fax back to [facility] at [phone]

## 2024-07-13 ENCOUNTER — Telehealth: Payer: Self-pay | Admitting: *Deleted

## 2024-07-13 NOTE — Telephone Encounter (Signed)
 Jessica Duwaine RAMAN, CMA    07/12/24  4:43 PM Note Received CMN from [facility]. Placed into Pawar's sign folder in A Pod. Once signed, will fax back to [facility] at [phone]

## 2024-08-03 ENCOUNTER — Encounter: Payer: Self-pay | Admitting: Dermatology

## 2024-08-03 ENCOUNTER — Ambulatory Visit: Admitting: Dermatology

## 2024-08-03 DIAGNOSIS — L578 Other skin changes due to chronic exposure to nonionizing radiation: Secondary | ICD-10-CM

## 2024-08-03 DIAGNOSIS — L814 Other melanin hyperpigmentation: Secondary | ICD-10-CM

## 2024-08-03 DIAGNOSIS — L219 Seborrheic dermatitis, unspecified: Secondary | ICD-10-CM

## 2024-08-03 DIAGNOSIS — Z85828 Personal history of other malignant neoplasm of skin: Secondary | ICD-10-CM

## 2024-08-03 DIAGNOSIS — L821 Other seborrheic keratosis: Secondary | ICD-10-CM

## 2024-08-03 DIAGNOSIS — L905 Scar conditions and fibrosis of skin: Secondary | ICD-10-CM

## 2024-08-03 DIAGNOSIS — D229 Melanocytic nevi, unspecified: Secondary | ICD-10-CM | POA: Diagnosis not present

## 2024-08-03 DIAGNOSIS — D1801 Hemangioma of skin and subcutaneous tissue: Secondary | ICD-10-CM

## 2024-08-03 MED ORDER — CLOBETASOL PROPIONATE 0.05 % EX SOLN: 1.0000 | Freq: Two times a day (BID) | CUTANEOUS | 3 refills | Status: AC

## 2024-08-03 NOTE — Progress Notes (Unsigned)
   Follow-Up Visit   Subjective  [name] is a 63 y.o. male who presents for the following: Skin Cancer Screening and Full Body Skin Exam  The patient presents for Total-Body Skin Exam (TBSE) for skin cancer screening and mole check. The patient has spots, moles and lesions to be evaluated, some may be new or changing.  The following portions of the chart were reviewed this encounter and updated as appropriate: medications, allergies, medical history  Review of Systems:  No other skin or systemic complaints except as noted in HPI or Assessment and Plan.  Objective  Well appearing patient in no apparent distress; mood and affect are within normal limits.  A full examination was performed including scalp, head, eyes, ears, nose, lips, neck, chest, axillae, abdomen, back, buttocks, bilateral upper extremities, bilateral lower extremities, hands, feet, fingers, toes, fingernails, and toenails. All findings within normal limits unless otherwise noted below.   Relevant physical exam findings are noted in the Assessment and Plan.    Assessment & Plan   SKIN CANCER SCREENING PERFORMED TODAY.  ACTINIC DAMAGE - Chronic condition, secondary to cumulative UV/sun exposure - diffuse scaly erythematous macules with underlying dyspigmentation - Recommend daily broad spectrum sunscreen SPF 30+ to sun-exposed areas, reapply every 2 hours as needed.  - Staying in the shade or wearing long sleeves, sun glasses (UVA+UVB protection) and wide brim hats (4-inch brim around the entire circumference of the hat) are also recommended for sun protection.  - Call for new or changing lesions.  MELANOCYTIC NEVI - Tan-brown and/or pink-flesh-colored symmetric macules and papules - Benign appearing on exam today - Observation - Call clinic for new or changing moles - Recommend daily use of broad spectrum spf 30+ sunscreen to sun-exposed areas.   LENTIGINES Exam: scattered tan macules Due to sun  exposure Treatment Plan: Benign-appearing, observe. Recommend daily broad spectrum sunscreen SPF 30+ to sun-exposed areas, reapply every 2 hours as needed.  Call for any changes   HEMANGIOMA Exam: red papule(s) Discussed benign nature. Recommend observation. Call for changes.   SEBORRHEIC KERATOSIS - Stuck-on, waxy, tan-brown papules and/or plaques  - Benign-appearing - Discussed benign etiology and prognosis. - Observe - Call for any changes  HISTORY OF SQUAMOUS CELL CARCINOMA OF THE SKIN- left forearm - No evidence of recurrence today - No lymphadenopathy - Recommend regular full body skin exams - Recommend daily broad spectrum sunscreen SPF 30+ to sun-exposed areas, reapply every 2 hours as needed.  - Call if any new or changing lesions are noted between office visits  Return in about 6 months (around 02/01/2025) for TBSE.  I, Darice Smock, CMA, am acting as scribe for RUFUS CHRISTELLA HOLY, MD.   Documentation: I have reviewed the above documentation for accuracy and completeness, and I agree with the above.  RUFUS CHRISTELLA HOLY, MD

## 2024-08-09 ENCOUNTER — Inpatient Hospital Stay (HOSPITAL_COMMUNITY)
Admission: EM | Admit: 2024-08-09 | Discharge: 2024-08-13 | DRG: 309 | Disposition: A | Discharge status: Home / Self Care | Attending: Internal Medicine | Admitting: Internal Medicine

## 2024-08-09 ENCOUNTER — Emergency Department (HOSPITAL_COMMUNITY)

## 2024-08-09 ENCOUNTER — Encounter (HOSPITAL_COMMUNITY): Payer: Self-pay

## 2024-08-09 ENCOUNTER — Other Ambulatory Visit: Payer: Self-pay

## 2024-08-09 DIAGNOSIS — G4733 Obstructive sleep apnea (adult) (pediatric): Secondary | ICD-10-CM | POA: Diagnosis present

## 2024-08-09 DIAGNOSIS — I2510 Atherosclerotic heart disease of native coronary artery without angina pectoris: Secondary | ICD-10-CM | POA: Diagnosis present

## 2024-08-09 DIAGNOSIS — E1151 Type 2 diabetes mellitus with diabetic peripheral angiopathy without gangrene: Secondary | ICD-10-CM | POA: Diagnosis present

## 2024-08-09 DIAGNOSIS — I4819 Other persistent atrial fibrillation: Secondary | ICD-10-CM | POA: Diagnosis not present

## 2024-08-09 DIAGNOSIS — Z886 Allergy status to analgesic agent status: Secondary | ICD-10-CM

## 2024-08-09 DIAGNOSIS — E66812 Obesity, class 2: Secondary | ICD-10-CM | POA: Diagnosis present

## 2024-08-09 DIAGNOSIS — I959 Hypotension, unspecified: Secondary | ICD-10-CM | POA: Diagnosis present

## 2024-08-09 DIAGNOSIS — I110 Hypertensive heart disease with heart failure: Secondary | ICD-10-CM | POA: Diagnosis present

## 2024-08-09 DIAGNOSIS — Z8249 Family history of ischemic heart disease and other diseases of the circulatory system: Secondary | ICD-10-CM

## 2024-08-09 DIAGNOSIS — E785 Hyperlipidemia, unspecified: Secondary | ICD-10-CM | POA: Diagnosis present

## 2024-08-09 DIAGNOSIS — Z79899 Other long term (current) drug therapy: Secondary | ICD-10-CM

## 2024-08-09 DIAGNOSIS — K219 Gastro-esophageal reflux disease without esophagitis: Secondary | ICD-10-CM | POA: Diagnosis present

## 2024-08-09 DIAGNOSIS — I5042 Chronic combined systolic (congestive) and diastolic (congestive) heart failure: Secondary | ICD-10-CM | POA: Diagnosis not present

## 2024-08-09 DIAGNOSIS — Z7951 Long term (current) use of inhaled steroids: Secondary | ICD-10-CM

## 2024-08-09 DIAGNOSIS — J4489 Other specified chronic obstructive pulmonary disease: Secondary | ICD-10-CM | POA: Diagnosis present

## 2024-08-09 DIAGNOSIS — I878 Other specified disorders of veins: Secondary | ICD-10-CM | POA: Diagnosis present

## 2024-08-09 DIAGNOSIS — Z7902 Long term (current) use of antithrombotics/antiplatelets: Secondary | ICD-10-CM

## 2024-08-09 DIAGNOSIS — I890 Lymphedema, not elsewhere classified: Secondary | ICD-10-CM | POA: Diagnosis present

## 2024-08-09 DIAGNOSIS — Z885 Allergy status to narcotic agent status: Secondary | ICD-10-CM

## 2024-08-09 DIAGNOSIS — R0902 Hypoxemia: Secondary | ICD-10-CM | POA: Diagnosis not present

## 2024-08-09 DIAGNOSIS — Z8673 Personal history of transient ischemic attack (TIA), and cerebral infarction without residual deficits: Secondary | ICD-10-CM

## 2024-08-09 DIAGNOSIS — Z85828 Personal history of other malignant neoplasm of skin: Secondary | ICD-10-CM

## 2024-08-09 DIAGNOSIS — J449 Chronic obstructive pulmonary disease, unspecified: Secondary | ICD-10-CM | POA: Diagnosis present

## 2024-08-09 DIAGNOSIS — R0789 Other chest pain: Secondary | ICD-10-CM | POA: Diagnosis present

## 2024-08-09 DIAGNOSIS — Z7901 Long term (current) use of anticoagulants: Secondary | ICD-10-CM

## 2024-08-09 DIAGNOSIS — Z87891 Personal history of nicotine dependence: Secondary | ICD-10-CM

## 2024-08-09 DIAGNOSIS — I4891 Unspecified atrial fibrillation: Secondary | ICD-10-CM | POA: Diagnosis not present

## 2024-08-09 DIAGNOSIS — Z6837 Body mass index (BMI) 37.0-37.9, adult: Secondary | ICD-10-CM

## 2024-08-09 DIAGNOSIS — Z888 Allergy status to other drugs, medicaments and biological substances status: Secondary | ICD-10-CM

## 2024-08-09 DIAGNOSIS — I5020 Unspecified systolic (congestive) heart failure: Secondary | ICD-10-CM

## 2024-08-09 DIAGNOSIS — E8779 Other fluid overload: Secondary | ICD-10-CM

## 2024-08-09 DIAGNOSIS — Z91048 Other nonmedicinal substance allergy status: Secondary | ICD-10-CM

## 2024-08-09 DIAGNOSIS — Z713 Dietary counseling and surveillance: Secondary | ICD-10-CM

## 2024-08-09 DIAGNOSIS — Z5941 Food insecurity: Secondary | ICD-10-CM

## 2024-08-09 LAB — CBC WITH DIFFERENTIAL/PLATELET
Abs Immature Granulocytes: 0.02 K/uL (ref 0.00–0.07)
Basophils Absolute: 0 K/uL (ref 0.0–0.1)
Basophils Relative: 1 %
Eosinophils Absolute: 0.2 K/uL (ref 0.0–0.5)
Eosinophils Relative: 2 %
HCT: 44.1 % (ref 39.0–52.0)
Hemoglobin: 14.5 g/dL (ref 13.0–17.0)
Immature Granulocytes: 0 %
Lymphocytes Relative: 23 %
Lymphs Abs: 2 K/uL (ref 0.7–4.0)
MCH: 29.5 pg (ref 26.0–34.0)
MCHC: 32.9 g/dL (ref 30.0–36.0)
MCV: 89.6 fL (ref 80.0–100.0)
Monocytes Absolute: 0.9 K/uL (ref 0.1–1.0)
Monocytes Relative: 10 %
Neutro Abs: 5.6 K/uL (ref 1.7–7.7)
Neutrophils Relative %: 64 %
Platelets: 269 K/uL (ref 150–400)
RBC: 4.92 MIL/uL (ref 4.22–5.81)
RDW: 13 % (ref 11.5–15.5)
WBC: 8.8 K/uL (ref 4.0–10.5)
nRBC: 0 % (ref 0.0–0.2)

## 2024-08-09 LAB — TROPONIN T, HIGH SENSITIVITY
Troponin T High Sensitivity: <15 ng/L (ref 0–19)
Troponin T High Sensitivity: <15 ng/L (ref 0–19)

## 2024-08-09 LAB — I-STAT CHEM 8, ED
BUN: 13 mg/dL (ref 8–23)
Calcium, Ion: 1.17 mmol/L (ref 1.15–1.40)
Chloride: 98 mmol/L (ref 98–111)
Creatinine, Ser: 1.1 mg/dL (ref 0.61–1.24)
Glucose, Bld: 130 mg/dL — ABNORMAL HIGH (ref 70–99)
HCT: 44 % (ref 39.0–52.0)
Hemoglobin: 15 g/dL (ref 13.0–17.0)
Potassium: 3.3 mmol/L — ABNORMAL LOW (ref 3.5–5.1)
Sodium: 141 mmol/L (ref 135–145)
TCO2: 29 mmol/L (ref 22–32)

## 2024-08-09 LAB — PROTIME-INR
INR: 1.1 (ref 0.8–1.2)
Prothrombin Time: 14.9 s (ref 11.4–15.2)

## 2024-08-09 LAB — I-STAT CG4 LACTIC ACID, ED: Lactic Acid, Venous: 1.9 mmol/L (ref 0.5–1.9)

## 2024-08-09 LAB — TSH: TSH: 2.5 u[IU]/mL (ref 0.350–4.500)

## 2024-08-09 LAB — PRO BRAIN NATRIURETIC PEPTIDE: Pro Brain Natriuretic Peptide: 1350 pg/mL — ABNORMAL HIGH (ref <300.0)

## 2024-08-09 LAB — HIV ANTIBODY (ROUTINE TESTING W REFLEX): HIV Screen 4th Generation wRfx: NONREACTIVE

## 2024-08-09 MED ORDER — AMIODARONE HCL IN DEXTROSE 360-4.14 MG/200ML-% IV SOLN
60.0000 mg/h | INTRAVENOUS | Status: AC
Start: 2024-08-09 — End: 2024-08-09
  Administered 2024-08-09: 60 mg/h via INTRAVENOUS
  Filled 2024-08-09: qty 200

## 2024-08-09 MED ORDER — SODIUM CHLORIDE 0.9 % IV SOLN: INTRAVENOUS | Status: AC | PRN

## 2024-08-09 MED ORDER — ALBUTEROL SULFATE HFA 108 (90 BASE) MCG/ACT IN AERS: 2.0000 | INHALATION_SPRAY | Freq: Four times a day (QID) | RESPIRATORY_TRACT | Status: DC | PRN

## 2024-08-09 MED ORDER — BUDESON-GLYCOPYRROL-FORMOTEROL 160-9-4.8 MCG/ACT IN AERO
2.0000 | INHALATION_SPRAY | Freq: Two times a day (BID) | RESPIRATORY_TRACT | Status: DC
  Administered 2024-08-09 – 2024-08-11 (×5): 2 via RESPIRATORY_TRACT
  Filled 2024-08-09: qty 5.9

## 2024-08-09 MED ORDER — CLOPIDOGREL BISULFATE 75 MG PO TABS
37.5000 mg | ORAL_TABLET | Freq: Every day | ORAL | Status: DC
  Administered 2024-08-10 – 2024-08-13 (×4): 37.5 mg via ORAL
  Filled 2024-08-09 (×4): qty 1

## 2024-08-09 MED ORDER — AMIODARONE HCL IN DEXTROSE 360-4.14 MG/200ML-% IV SOLN
30.0000 mg/h | INTRAVENOUS | Status: DC
  Administered 2024-08-09 – 2024-08-11 (×5): 30 mg/h via INTRAVENOUS
  Filled 2024-08-09 (×5): qty 200

## 2024-08-09 MED ORDER — PHENTERMINE HCL 37.5 MG PO CAPS: 37.5000 mg | ORAL_CAPSULE | Freq: Every morning | ORAL | Status: DC

## 2024-08-09 MED ORDER — FUROSEMIDE 10 MG/ML IJ SOLN
80.0000 mg | Freq: Once | INTRAMUSCULAR | Status: AC
  Administered 2024-08-09: 80 mg via INTRAVENOUS
  Filled 2024-08-09: qty 8

## 2024-08-09 MED ORDER — NITROGLYCERIN 0.4 MG SL SUBL: 0.4000 mg | SUBLINGUAL_TABLET | SUBLINGUAL | Status: DC | PRN

## 2024-08-09 MED ORDER — CLOPIDOGREL BISULFATE 75 MG PO TABS: 37.5000 mg | ORAL_TABLET | Freq: Every day | ORAL | Status: DC

## 2024-08-09 MED ORDER — ATORVASTATIN CALCIUM 40 MG PO TABS
40.0000 mg | ORAL_TABLET | Freq: Every day | ORAL | Status: DC
  Administered 2024-08-10 – 2024-08-13 (×4): 40 mg via ORAL
  Filled 2024-08-09 (×4): qty 1

## 2024-08-09 MED ORDER — ORAL CARE MOUTH RINSE: 15.0000 mL | OROMUCOSAL | Status: DC | PRN

## 2024-08-09 MED ORDER — POTASSIUM CHLORIDE CRYS ER 20 MEQ PO TBCR
40.0000 meq | EXTENDED_RELEASE_TABLET | Freq: Once | ORAL | Status: AC
  Administered 2024-08-09: 40 meq via ORAL
  Filled 2024-08-09: qty 2

## 2024-08-09 MED ORDER — PANTOPRAZOLE SODIUM 40 MG PO TBEC
40.0000 mg | DELAYED_RELEASE_TABLET | Freq: Every day | ORAL | Status: DC
  Administered 2024-08-10 – 2024-08-13 (×4): 40 mg via ORAL
  Filled 2024-08-09 (×4): qty 1

## 2024-08-09 MED ORDER — ASPIRIN 81 MG PO CHEW
324.0000 mg | CHEWABLE_TABLET | Freq: Once | ORAL | Status: AC
  Administered 2024-08-09: 324 mg via ORAL
  Filled 2024-08-09: qty 4

## 2024-08-09 MED ORDER — POTASSIUM CHLORIDE ER 10 MEQ PO TBCR
40.0000 meq | EXTENDED_RELEASE_TABLET | Freq: Two times a day (BID) | ORAL | Status: DC
  Administered 2024-08-09: 40 meq via ORAL
  Filled 2024-08-09 (×3): qty 4

## 2024-08-09 MED ORDER — AMIODARONE IV BOLUS ONLY 150 MG/100ML
150.0000 mg | Freq: Once | INTRAVENOUS | Status: AC
  Administered 2024-08-09: 150 mg via INTRAVENOUS
  Filled 2024-08-09: qty 100

## 2024-08-09 MED ORDER — APIXABAN 5 MG PO TABS
5.0000 mg | ORAL_TABLET | Freq: Two times a day (BID) | ORAL | Status: DC
  Administered 2024-08-09 – 2024-08-13 (×8): 5 mg via ORAL
  Filled 2024-08-09 (×8): qty 1

## 2024-08-09 MED ORDER — CHLORHEXIDINE GLUCONATE CLOTH 2 % EX PADS
6.0000 | MEDICATED_PAD | Freq: Every day | CUTANEOUS | Status: DC
  Administered 2024-08-09 – 2024-08-13 (×5): 6 via TOPICAL

## 2024-08-09 MED ORDER — ALBUTEROL SULFATE (2.5 MG/3ML) 0.083% IN NEBU: 2.5000 mg | INHALATION_SOLUTION | Freq: Four times a day (QID) | RESPIRATORY_TRACT | Status: DC | PRN

## 2024-08-09 MED ORDER — IOHEXOL 350 MG/ML SOLN
100.0000 mL | Freq: Once | INTRAVENOUS | Status: AC | PRN
  Administered 2024-08-09: 100 mL via INTRAVENOUS

## 2024-08-09 NOTE — ED Triage Notes (Signed)
 Pt arrived via POV, c/o states he woke feeling weak and right leg has been cold and hot. HR 140s in triage. Hx of afib, states needed cardioversion the last time he had this issue.

## 2024-08-09 NOTE — Plan of Care (Incomplete)

## 2024-08-09 NOTE — ED Provider Notes (Signed)
 [facility]MENT AT [facility] Provider Note   CSN: [id number] Arrival date & time: 08/09/24  1014     History Chief Complaint  [name] presents with   Weakness    HPI: [name] is a 63 y.o. male with history pertinent for COPD not on home oxygen, GERD, T2DM, prior CVA, right internal carotid occlusion, hyperlipidemia, HTN, A-fib on Eliquis  and Plavix  who presents complaining of multiple complaints. [name] arrived via POV. History provided by [name].  No interpreter required during this encounter.  [name] reports that that over the past 2 days [name] has had intermittent sensation of coldness and color change in his right lower extremity that will occur without specific precipitating factors, followed by spontaneous resolution with brief episode of sensation that the leg is very hot, and then return back to normal.  [name] has not had any weakness or falls.  Reports that otherwise [name] was in his normal state of health, however this morning [name] woke and had a sensation of generalized weakness as well as chest pain and shortness of breath which worsened with exertion.  Reports that [name] went to his job, however his employer told him that [name] did not look good and sent him to the emergency department for further evaluation.  Reports that [name] has been adherent to his Eliquis  and Plavix  with most recent dose this morning.  Reports that [name] cannot tell whether or not [name] is in A-fib, though does not believe that [name] is permanently in A-fib.  Denies any fever, chills, vomiting, diarrhea, dysuria.  Endorses nausea.  [name]'s recorded medical, surgical, social, medication list and allergies were reviewed in the Snapshot window as part of the initial history.   Prior to Admission medications   Medication Sig Start Date End Date Taking? Authorizing Provider  albuterol  (PROVENTIL  HFA;VENTOLIN  HFA) 108 (90 BASE) MCG/ACT inhaler Inhale 2 puffs into the lungs every 6 (six) hours as  needed for wheezing or shortness of breath.    Yes [provider]  amiodarone  (PACERONE ) 100 MG tablet Take 100 mg by mouth daily.   Yes [provider]  amoxicillin-clavulanate (AUGMENTIN) 875-125 MG tablet Take 1 tablet by mouth 2 (two) times daily. 07/31/24 08/10/24 Yes [provider]  apixaban  (ELIQUIS ) 5 MG TABS tablet Take 1 tablet (5 mg total) by mouth 2 (two) times daily. 10/19/20  Yes Patsy Lenis, MD  atorvastatin  (LIPITOR) 40 MG tablet Take 40 mg by mouth daily.   Yes [provider]  budesonide -glycopyrrolate -formoterol (BREZTRI  AEROSPHERE) 160-9-4.8 MCG/ACT AERO inhaler Inhale 2 puffs into the lungs in the morning and at bedtime. 07/03/24  Yes Pawar, Rahul, MD  clopidogrel  (PLAVIX ) 75 MG tablet Take 0.5 tablets (37.5 mg total) by mouth daily. [name] taking differently: Take 37.5 mg by mouth in the morning and at bedtime. 10/19/20  Yes Patsy Lenis, MD  furosemide  (LASIX ) 40 MG tablet Take 40 mg by mouth in the morning and at bedtime. 03/18/23  Yes [provider]  isosorbide mononitrate (IMDUR) 30 MG 24 hr tablet Take 30 mg by mouth daily.   Yes [provider]  Magnesium  Oxide (MAGOX 400 PO) Take 1 Piece by mouth See admin instructions. Chew 400 mg (1 gummie) by mouth in the morning and at bedtime   Yes [provider]  neomycin-polymyxin-hydrocortisone (CORTISPORIN) OTIC solution Place 3 drops into the left ear 3 (three) times daily. 07/31/24 08/10/24 Yes [provider]  nitroGLYCERIN  (NITROSTAT ) 0.4 MG SL tablet Place 0.4 mg under  the tongue every 5 (five) minutes as needed for chest pain. 03/03/23  Yes [provider]  omeprazole (PRILOSEC) 40 MG capsule Take 40 mg by mouth daily before breakfast.   Yes [provider]  phentermine 37.5 MG capsule Take 37.5 mg by mouth in the morning.   Yes [provider]  clobetasol  (TEMOVATE ) 0.05 % external solution Apply 1 Application topically 2  (two) times daily. [name] not taking: Reported on 08/09/2024 08/03/24   Paci, Karina M, MD  oxyCODONE  (OXY IR/ROXICODONE ) 5 MG immediate release tablet Take 1 tablet (5 mg total) by mouth every 4 (four) hours as needed for up to 8 doses for severe pain (pain score 7-10). [name] not taking: Reported on 08/09/2024 12/08/23   Paci, Karina M, MD  pantoprazole  (PROTONIX ) 40 MG tablet Take 1 tablet (40 mg total) by mouth daily as needed (for heartburn). [name] not taking: Reported on 08/09/2024 10/19/20   Patsy Lenis, MD  potassium chloride  (KLOR-CON ) 10 MEQ tablet Take 2 tablets (20 mEq total) by mouth daily. [name] not taking: Reported on 08/09/2024 05/30/23   Trine Raynell Moder, MD     Allergies: Tylox [oxycodone -acetaminophen ], Gabapentin , and Tape   Review of Systems   ROS as per HPI  Physical Exam Updated Vital Signs BP (!) 109/95 (BP Location: Left Arm)   Pulse (!) 103   Temp (!) 97.5 F (36.4 C) (Oral)   Resp 14   Ht 5' 8 (1.727 m)   Wt 111.8 kg   SpO2 97%   BMI 37.48 kg/m  Physical Exam Vitals and nursing note reviewed.  Constitutional:      General: [name] is not in acute distress.    Appearance: [name] is well-developed.  HENT:     Head: Normocephalic and atraumatic.  Eyes:     Conjunctiva/sclera: Conjunctivae normal.  Cardiovascular:     Rate and Rhythm: Tachycardia present. Rhythm irregular.     Heart sounds: No murmur heard. Pulmonary:     Effort: Pulmonary effort is normal. No respiratory distress.     Breath sounds: Normal breath sounds.  Abdominal:     Palpations: Abdomen is soft.     Tenderness: There is no abdominal tenderness.  Musculoskeletal:        General: No swelling.     Cervical back: Neck supple.     Right lower leg: Edema present.     Left lower leg: Edema present.  Skin:    General: Skin is warm and dry.     Capillary Refill: Capillary refill takes less than 2 seconds.  Neurological:     Mental Status: [name] is alert.  Psychiatric:         Mood and Affect: Mood normal.     ED Course/ Medical Decision Making/ A&P    Procedures .Critical Care  Performed by: Rogelia Jerilynn RAMAN, MD Authorized by: Rogelia Jerilynn RAMAN, MD   Critical care provider statement:    Critical care time (minutes):  31   Critical care was necessary to treat or prevent imminent or life-threatening deterioration of the following conditions:  Cardiac failure (A-fib RVR)   Critical care was time spent personally by me on the following activities:  Development of treatment plan with [name] or surrogate, discussions with consultants, evaluation of [name]'s response to treatment, examination of [name], ordering and review of laboratory studies, ordering and review of radiographic studies, ordering and performing treatments and interventions, pulse oximetry, re-evaluation of [name]'s condition and review of old charts    Medications  Ordered in ED Medications  amiodarone  (NEXTERONE  PREMIX) 360-4.14 MG/200ML-% (1.8 mg/mL) IV infusion (60 mg/hr Intravenous Infusion Verify 08/09/24 1634)    Followed by  amiodarone  (NEXTERONE  PREMIX) 360-4.14 MG/200ML-% (1.8 mg/mL) IV infusion (has no administration in time range)  apixaban  (ELIQUIS ) tablet 5 mg (has no administration in time range)  atorvastatin  (LIPITOR) tablet 40 mg (has no administration in time range)  budesonide -glycopyrrolate -formoterol (BREZTRI ) 160-9-4.8 MCG/ACT inhaler 2 puff (2 puffs Inhalation Not Given 08/09/24 1610)  nitroGLYCERIN  (NITROSTAT ) SL tablet 0.4 mg (has no administration in time range)  pantoprazole  (PROTONIX ) EC tablet 40 mg (has no administration in time range)  potassium chloride  (KLOR-CON ) CR tablet 40 mEq (has no administration in time range)  albuterol  (PROVENTIL ) (2.5 MG/3ML) 0.083% nebulizer solution 2.5 mg (has no administration in time range)  clopidogrel  (PLAVIX ) tablet 37.5 mg (has no administration in time range)  Chlorhexidine  Gluconate Cloth 2 % PADS 6 each (6 each Topical  Given 08/09/24 1615)  Oral care mouth rinse (has no administration in time range)  0.9 %  sodium chloride  infusion (has no administration in time range)  amiodarone  (NEXTERONE ) 1.5 mg/mL IV bolus only 150 mg (0 mg Intravenous Stopped 08/09/24 1351)  aspirin  chewable tablet 324 mg (324 mg Oral Given 08/09/24 1110)  iohexol  (OMNIPAQUE ) 350 MG/ML injection 100 mL (100 mLs Intravenous Contrast Given 08/09/24 1152)  potassium chloride  SA (KLOR-CON  M) CR tablet 40 mEq (40 mEq Oral Given 08/09/24 1408)  furosemide  (LASIX ) injection 80 mg (80 mg Intravenous Given 08/09/24 1407)    Medical Decision Making:   BIRL LOBELLO is a 63 y.o. male who presents for multiple symptoms as per above.  Physical exam is pertinent for tachycardia with a regular rhythm, bilateral lower extremity edema.   The differential includes but is not limited to aortic dissection, claudication, ischemia, ACS, PE, pneumonia, A-fib RVR.  Independent historian: None  External data reviewed: No pertinent external data  Labs: Ordered, Independent interpretation, and Details: Troponin WNL.  BNP significantly elevated at greater than 1000.  Lactic acid WNL.  Coags reassuring.  CBC without leukocytosis, anemia, thrombocytopenia.  I-STAT with mild hypokalemia.  No AKI or emergent electrolyte derangement.  Radiology: Ordered, Independent interpretation, Details: Personally reviewed chest x-ray, do not appreciate focal airspace opacification, cardiomediastinal silhouette derangement, pneumothorax, pleural effusion, bony derangement.  CTA dissection study without obvious dissection flap, CTA runoff without overt vessel cutoff, and All images reviewed independently.  Agree with radiology report at this time.   CT Angio Chest/Abd/Pel for Dissection W and/or Wo Contrast Result Date: 08/09/2024 EXAM: CTA CHEST, ABDOMEN AND PELVIS WITH AND WITHOUT CONTRAST 08/09/2024 12:12:52 PM TECHNIQUE: CTA of the chest was performed with and without the  administration of 100 mL of iohexol  (OMNIPAQUE ) 350 MG/ML injection. CTA of the abdomen and pelvis was performed with the administration of 100 mL of iohexol  (OMNIPAQUE ) 350 MG/ML injection. Multiplanar reformatted images are provided for review. MIP images are provided for review. Automated exposure control, iterative reconstruction, and/or weight based adjustment of the mA/kV was utilized to reduce the radiation dose to as low as reasonably achievable. COMPARISON: 06/02/2024 and previous. CLINICAL HISTORY: Acute aortic syndrome (AAS) suspected. Claudication or leg ischemia, right lower extremity claudication. FINDINGS: VASCULATURE: AORTA: Atheromatous thoracic aorta with minimal calcified plaque. No abdominal aortic aneurysm. No dissection. PULMONARY ARTERIES: No pulmonary embolism with the limits of this exam. GREAT VESSELS OF AORTIC ARCH: No acute finding. No dissection. No arterial occlusion or significant stenosis. CELIAC TRUNK: No acute finding. No occlusion or  significant stenosis. SUPERIOR MESENTERIC ARTERY: No acute finding. No occlusion or significant stenosis. INFERIOR MESENTERIC ARTERY: No acute finding. No occlusion or significant stenosis. RENAL ARTERIES: Two Right renal arteries, superior dominant, both patent. Aberrant trunk arising below the level of the IMA supplying both kidneys, widely patent proximally. Single left renal artery, patent. ILIAC ARTERIES: No acute finding. No occlusion or significant stenosis. CHEST: MEDIASTINUM: Subcentimeter prevascular and precarinal lymph nodes. Moderate scattered coronary calcifications. The heart and pericardium demonstrate no acute abnormality. LUNGS AND PLEURA: The lungs are without acute process. No focal consolidation or pulmonary edema. No evidence of pleural effusion or pneumothorax. THORACIC BONES AND SOFT TISSUES: Vertebral endplate spurring at multiple levels in the lower thoracic spine. No acute soft tissue abnormality. ABDOMEN AND PELVIS: LIVER:  The liver is unremarkable. GALLBLADDER AND BILE DUCTS: Gallbladder is unremarkable. No biliary ductal dilatation. SPLEEN: The spleen is unremarkable. PANCREAS: The pancreas is unremarkable. ADRENAL GLANDS: Bilateral adrenal glands demonstrate no acute abnormality. KIDNEYS, URETERS AND BLADDER: Horseshoe kidney. No stones in the kidneys or ureters. No hydronephrosis. No perinephric or periureteral stranding. Urinary bladder is unremarkable. GI AND BOWEL: Stomach and duodenal sweep demonstrate no acute abnormality. There is no bowel obstruction. No abnormal bowel wall thickening or distension. REPRODUCTIVE: Prostate enlargement with central coarse calcifications. PERITONEUM AND RETROPERITONEUM: No ascites or free air. LYMPH NODES: No lymphadenopathy. ABDOMINAL BONES AND SOFT TISSUES: Mild facet DJD L4 - S1 right worse than left. No acute soft tissue abnormality. IMPRESSION: 1. No evidence of acute aortic syndrome, dissection, or aneurysm. 2. No pulmonary embolism. 3. Horseshoe kidney. No stones or hydronephrosis. 4. Prostatomegaly. Electronically signed by: Katheleen Faes MD 08/09/2024 12:45 PM EDT RP Workstation: [id number]   CT Angio Aortobifemoral W and/or Wo Contrast Result Date: 08/09/2024 EXAM: CTA AORTOBIFEM WITH RUNOFF 08/09/2024 12:12:52 PM TECHNIQUE: Contrast-enhanced computed tomography angiography of the lower extremity was performed with multiplanar reconstructions. Maximum intensity projection images were created on a separate workstation and reviewed. Automated exposure control, iterative reconstruction, and/or weight based adjustment of the mA/kV was utilized to reduce the radiation dose to as low as reasonably achievable. CONTRAST: 100mL iohexol  (OMNIPAQUE ) 350 MG/ML injection. COMPARISON: None available. CLINICAL HISTORY: Claudication or leg ischemia; Right lower extremity claudication. Acute aortic syndrome (AAS) suspected; Claudication or leg ischemia, Right lower extremity claudication.  FINDINGS: ARTERIAL: AORTA: Moderate calcified nonocclusive plaque in the visualized distal abdominal aorta without aneurysm. RIGHT LOWER EXTREMITY: RIGHT COMMON ILIAC ARTERY: Mildly atheromatous, patent. Mild origin stenosis right internal iliac artery, patent distally. RIGHT EXTERNAL ILIAC ARTERY: No significant stenosis or vessel occlusion. RIGHT COMMON FEMORAL ARTERY: Minimal plaque in the distal common femoral artery. RIGHT SUPERFICIAL FEMORAL ARTERY: Mild scattered calcified plaque without high-grade stenosis. RIGHT POPLITEAL ARTERY: Minimal plaque in the distal right popliteal artery without stenosis. RIGHT TIBIOPERONEAL TRUNK: atheromatous but apparently contiguous 3-vessel right tibial runoff. RIGHT ANTERIOR TIBIAL ARTERY: Flow is identified in the anterior tibial artery to the ankle. Flow identified in the dorsalis pedis artery. RIGHT PERONEAL ARTERY: Flow is identified to the ankle. RIGHT POSTERIOR TIBIAL ARTERY: No significant stenosis or vessel occlusion. Posterior tibial artery flow is present into the hindfoot. LEFT LOWER EXTREMITY: LEFT COMMON ILIAC ARTERY: Mild plaque. No plaque in the internal iliac. LEFT EXTERNAL ILIAC ARTERY: No significant stenosis or vessel occlusion. LEFT COMMON FEMORAL ARTERY: Mild plaque in the distal common femoral artery. LEFT SUPERFICIAL FEMORAL ARTERY: No significant stenosis or vessel occlusion. LEFT POPLITEAL ARTERY: Minimal plaque. LEFT TIBIOPERONEAL TRUNK: Minimally atheromatous but apparently contiguous 3-vessel tibial runoff, with some  venous opacification limiting evaluation distally. LEFT ANTERIOR TIBIAL ARTERY: Flow is identified in the anterior tibial artery to the ankle. Flow identified in the dorsalis pedis artery. LEFT PERONEAL ARTERY: Flow is identified to the ankle. LEFT POSTERIOR TIBIAL ARTERY: No significant stenosis or vessel occlusion. Posterior tibial artery flow is present into the hindfoot. BONES AND SOFT TISSUES: Horseshoe kidney, incompletely  visualized. Coarse calcifications in the prostate, mildly enlarged. No significant osseous abnormalities seen within the field of view. IMPRESSION: 1. Moderate calcified nonocclusive plaque in the distal abdominal aorta without aneurysm. 2. No hemodynamically significant lower extremity arterial occlusive disease identified. Electronically signed by: Katheleen Faes MD 08/09/2024 12:30 PM EDT RP Workstation: [id number]   DG Chest Portable 1 View Result Date: 08/09/2024 EXAM: 1 VIEW XRAY OF THE CHEST 08/09/2024 10:58:38 AM COMPARISON: 07/03/2024 CLINICAL HISTORY: Afib Rvr. Pt arrived via Pov, c/o states [name] woke feeling weak and right leg has been cold and hot. Hr 140s in triage. Hx of afib, states needed cardioversion the last time [name] had this issue. FINDINGS: LUNGS AND PLEURA: No focal pulmonary opacity. No pulmonary edema. No pleural effusion. No pneumothorax. HEART AND MEDIASTINUM: Numerous leads and wires over chest are noted. Normal heart size and mediastinal contours . BONES AND SOFT TISSUES: Lordotic positioning noted. No acute osseous abnormality. IMPRESSION: 1. No acute cardiopulmonary process. Electronically signed by: Norleen Kil MD 08/09/2024 12:07 PM EDT RP Workstation: [id number]    EKG/Medicine tests: Ordered and Independent interpretation EKG Interpretation: indeterminate rhythm, most likely A-fib given irregular RR interval and no definitive P waves RBBB and LAFB Baseline wander in lead(s) V1 Confirmed by Rogelia Satterfield ([id number]) on 08/09/2024 10:57:45 AM  Interventions: Aspirin , amiodarone , Lasix , potassium repletion  See the EMR for full details regarding lab and imaging results.  Presents for chest pain and shortness of breath, does have bilateral lower extremity edema.  [name] reports intermittent change in temperature/sensation of right lower extremity, currently warm, well-perfused, though with significant (but equal) lower extremity edema.  This presentation is concerning for  possible limb ischemia, especially in the setting of chest pain is concerning for possible dissection.  [name] additionally found to be in A-fib RVR.  Do feel that [name] warrants screening labs as well as CT imaging.  CT face fortunately do not demonstrate flow-limiting lesion or dissection, and labs overall reassuring with exception of significant hypervolemia with BNP greater than 1000.  [name] reports that [name] has been adherent to his home Lasix  which is 40 mg twice daily.  Given 80 IV while in the ED, and potassium also repleted.  [name] persistently in A-fib RVR, therefore given amiodarone  bolus which does give [name] partial improvement with rate, however still in A-fib RVR, therefore started on amiodarone  infusion.  Given [name] has significant heart failure exacerbation which is likely precipitating A-fib RVR, do not feel that [name] is an appropriate cardioversion candidate but rather is more appropriate for medical management with amiodarone .  Feel that [name] warrants admission for further rate control as well as diuresis.  Medicine consulted for admission, discussed with Dr. Raenelle who accepted the [name] to his service.  Presentation is most consistent with acute complicated illness, Presentation is most consistent with exacerbation of chronic illness, and I did consider and rule out acute life/limb-threatening illness  Discussion of management or test interpretations with external provider(s): Hospitalists, Dr. Raenelle  Risk Drugs:Prescription drug management Treatment: Decision regarding hospitalization Critical Care: 31  Disposition: ADMIT: I believe the [name] requires admission for further care and management. The [name]  was admitted to hospitalists. Please see inpatient provider note for additional treatment plan details.   MDM generated using voice dictation software and may contain dictation errors.  Please contact me for any clarification or with any  questions.  Clinical Impression:  1. Atrial fibrillation with rapid ventricular response (HCC)   2. Other hypervolemia      Admit   Final Clinical Impression(s) / ED Diagnoses Final diagnoses:  Atrial fibrillation with rapid ventricular response (HCC)  Other hypervolemia    Rx / DC Orders ED Discharge Orders     None        Rogelia Jerilynn RAMAN, MD 08/09/24 1641

## 2024-08-09 NOTE — H&P (Signed)
 History and Physical    [name] FMW:[id number] DOB: 1960-12-05 DOA: 08/09/2024  PCP: [name], MD  Patient coming from: Home  I have personally briefly reviewed patient's old medical records available.   Chief Complaint: Chest discomfort, sweating and palpitations and dizziness.  HPI: [name] is a 63 y.o. male with medical history significant of paroxysmal A-fib on amiodarone  and Eliquis , previous history of cardioversion, nonobstructive coronary artery disease, history of TIA and carotid artery disease, chronic venous insufficiency who presents to the emergency room from his work where he suddenly started feeling chest discomfort, sweating, hot and cold, palpitations and dizziness.  He does have chronic dyspnea.  He does have chronic diastolic heart failure with known EF of 60%.  Patient does have chronic leg swelling and they have not changed much.  They get worse after all day work standing in [employer].  Patient also had some sensation of hot and cold and numbness of the right leg 2 days ago that has improved now. Denies any cough, cold, flulike symptoms.  Denies any passing out spells.  Patient is consistently on anticoagulation and small dose of amiodarone  as he explains.  Patient used to see Dr. Levern in the past and recently seeing cardiologist at [facility]. ED Course: Patient arrived in the emergency room with stable blood pressure.  Initial heart rate was 148, patient was given 150 mg of amiodarone  bolus and started on maintenance amiodarone .  On my interview his heart rate is 115-120 and denies any chest pain or palpitations.  Patient was also given 80 mg of IV Lasix .  Potassium was 3.3.  Was given 40 of potassium.  His proBNP is 1300.  Patient has chronic dyspnea as above. With complaints of intermittent leg claudication, he underwent CT angiogram of the chest abdomen pelvis with no evidence of major obstruction, pulmonary embolism or blockage on the runoff.  Review  of Systems: all systems are reviewed and pertinent positive as per HPI otherwise rest are negative.    Past Medical History:  Diagnosis Date   Aneurysm    brain   Arthritis    Atrial fibrillation (HCC)    Bipolar disorder (HCC)    was on meds but was taken off 2 yrs ago and none since   Burning pain    in both legs-seeing Dr.Sethi for this   CAD (coronary artery disease)    Cataracts, bilateral    Childhood asthma    Last asthma attack at age 56; History of trach at 16 months (12/17/2016)   Chronic kidney disease 2009   Chronic lower back pain    COPD (chronic obstructive pulmonary disease) (HCC) 06/2012   uses Spiriva  and Albuterol  daily as needed   Depression    Family history of adverse reaction to anesthesia    Mom and sister have PONV   GERD (gastroesophageal reflux disease)    H/O hiatal hernia    Headache(784.0)    q 2-3 weeks; daily last 2 wks (12/17/2016)   Hyperlipidemia    takes Ramipril  daily   Hypertension    takes Ramipril  daily   Joint pain    Joint swelling    Middle cerebral artery aneurysm    right   Nocturia    Numbness    both arms    Pneumonia    hx of-2014   PONV (postoperative nausea and vomiting)    Pt reports nausea only.   Sleep apnea    dx just this past week  08/21/2017   Squamous cell carcinoma of skin    Stroke (HCC) 2015   drag left foot more since; have to wear glasses now (12/17/2016)   Type II diabetes mellitus (HCC)    takes Metformin  daily   dx 2015   Umbilical hernia    Urinary frequency    Wears dentures    Wears glasses    and contact lenses    Past Surgical History:  Procedure Laterality Date   ANEURYSM COILING  2015   CARDIOVERSION N/A 12/18/2016   Procedure: CARDIOVERSION;  Surgeon: Salena Negri, MD;  Location: [facility];  Service: Cardiovascular;  Laterality: N/A;   COLONOSCOPY     COLONOSCOPY N/A 04/08/2018   Procedure: COLONOSCOPY;  Surgeon: Rollin Dover, MD;  Location: [facility];  Service: Endoscopy;   Laterality: N/A;   COLONOSCOPY N/A 10/18/2020   Procedure: COLONOSCOPY;  Surgeon: Rollin Dover, MD;  Location: [facility];  Service: Endoscopy;  Laterality: N/A;   ESOPHAGOGASTRODUODENOSCOPY N/A 04/08/2018   Procedure: ESOPHAGOGASTRODUODENOSCOPY (EGD);  Surgeon: Rollin Dover, MD;  Location: [facility];  Service: Endoscopy;  Laterality: N/A;   ESOPHAGOGASTRODUODENOSCOPY N/A 04/21/2024   Procedure: EGD (ESOPHAGOGASTRODUODENOSCOPY);  Surgeon: Rollin Dover, MD;  Location: [facility];  Service: Gastroenterology;  Laterality: N/A;   EXCISIONAL HEMORRHOIDECTOMY  1980s   soon after rectal OR   GANGLION CYST EXCISION Right    HEMOSTASIS CLIP PLACEMENT  10/18/2020   Procedure: HEMOSTASIS CLIP PLACEMENT;  Surgeon: Rollin Dover, MD;  Location: [facility];  Service: Endoscopy;;   HOT HEMOSTASIS N/A 04/08/2018   Procedure: HOT HEMOSTASIS (ARGON PLASMA COAGULATION/BICAP);  Surgeon: Rollin Dover, MD;  Location: [facility];  Service: Endoscopy;  Laterality: N/A;   HOT HEMOSTASIS N/A 10/18/2020   Procedure: HOT HEMOSTASIS (ARGON PLASMA COAGULATION/BICAP);  Surgeon: Rollin Dover, MD;  Location: [facility];  Service: Endoscopy;  Laterality: N/A;   IR 3D INDEPENDENT WKST  05/10/2017   IR 3D INDEPENDENT WKST  06/28/2017   IR ANGIO INTRA EXTRACRAN SEL COM CAROTID INNOMINATE BILAT MOD SED  04/22/2017   IR ANGIO INTRA EXTRACRAN SEL COM CAROTID INNOMINATE BILAT MOD SED  11/22/2017   IR ANGIO INTRA EXTRACRAN SEL COM CAROTID INNOMINATE BILAT MOD SED  05/13/2018   IR ANGIO INTRA EXTRACRAN SEL INTERNAL CAROTID UNI L MOD SED  12/27/2017   IR ANGIO INTRA EXTRACRAN SEL INTERNAL CAROTID UNI R MOD SED  05/10/2017   IR ANGIO INTRA EXTRACRAN SEL INTERNAL CAROTID UNI R MOD SED  06/28/2017   IR ANGIO VERTEBRAL SEL SUBCLAVIAN INNOMINATE UNI L MOD SED  04/22/2017   IR ANGIO VERTEBRAL SEL SUBCLAVIAN INNOMINATE UNI R MOD SED  06/28/2017   IR ANGIO VERTEBRAL SEL SUBCLAVIAN INNOMINATE UNI R MOD SED  11/22/2017   IR ANGIO VERTEBRAL SEL  SUBCLAVIAN INNOMINATE UNI R MOD SED  05/13/2018   IR ANGIO VERTEBRAL SEL VERTEBRAL UNI R MOD SED  04/22/2017   IR ANGIOGRAM FOLLOW UP STUDY  05/10/2017   IR ANGIOGRAM FOLLOW UP STUDY  06/28/2017   IR ANGIOGRAM FOLLOW UP STUDY  12/27/2017   IR NEURO EACH ADD'L AFTER BASIC UNI LEFT (MS)  12/27/2017   IR NEURO EACH ADD'L AFTER BASIC UNI RIGHT (MS)  05/10/2017   IR NEURO EACH ADD'L AFTER BASIC UNI RIGHT (MS)  06/28/2017   IR RADIOLOGIST EVAL & MGMT  04/30/2017   IR RADIOLOGIST EVAL & MGMT  06/15/2017   IR RADIOLOGIST EVAL & MGMT  07/13/2017   IR RADIOLOGIST EVAL & MGMT  01/11/2018   IR TRANSCATH/EMBOLIZ  05/10/2017  IR TRANSCATH/EMBOLIZ  06/28/2017   IR TRANSCATH/EMBOLIZ  12/27/2017   MULTIPLE TOOTH EXTRACTIONS     POLYPECTOMY  04/08/2018   Procedure: POLYPECTOMY;  Surgeon: Rollin Dover, MD;  [facility]: [facility];  Service: Endoscopy;;   RADIOLOGY WITH ANESTHESIA N/A 08/08/2014   Procedure: RADIOLOGY WITH ANESTHESIA EMBOLIZATION;  Surgeon: Medication Radiologist, MD;  [facility];  Service: Radiology;  Laterality: N/A;   RADIOLOGY WITH ANESTHESIA N/A 11/21/2014   Procedure: RADIOLOGY WITH ANESTHESIA;  Surgeon: Thyra MARLA Nash, MD;  [facility];  Service: Radiology;  Laterality: N/A;   RADIOLOGY WITH ANESTHESIA N/A 02/27/2015   Procedure: RADIOLOGY WITH ANESTHESIA;  Surgeon: Thyra Nash, MD;  [facility];  Service: Radiology;  Laterality: N/A;   RADIOLOGY WITH ANESTHESIA N/A 09/25/2015   Procedure: RADIOLOGY WITH ANESTHESIA;  Surgeon: Thyra Nash, MD;  [facility];  Service: Radiology;  Laterality: N/A;   RADIOLOGY WITH ANESTHESIA N/A 05/10/2017   Procedure: EMBOLIZATION;  Surgeon: Nash Thyra, MD;  [facility];  Service: Radiology;  Laterality: N/A;   RADIOLOGY WITH ANESTHESIA N/A 06/28/2017   Procedure: RADIOLOGY WITH ANESTHESIA-EMBOLIZATION;  Surgeon: Nash Thyra, MD;  [facility];  Service: Radiology;  Laterality: N/A;   RADIOLOGY WITH ANESTHESIA N/A  08/26/2017   Procedure: RADIOLOGY WITH ANESTHESIA EMBOLIZATION;  Surgeon: Nash Thyra, MD;  [facility];  Service: Radiology;  Laterality: N/A;   RADIOLOGY WITH ANESTHESIA N/A 11/22/2017   Procedure: EMBOLIZATION;  Surgeon: Nash Thyra, MD;  [facility];  Service: Radiology;  Laterality: N/A;   RADIOLOGY WITH ANESTHESIA N/A 12/27/2017   Procedure: EMBOLIZATION;  Surgeon: Nash Thyra, MD;  [facility];  Service: Radiology;  Laterality: N/A;   RECTAL SURGERY  1980s   tore it lifting heavy furniture; sewed it back together   SHOULDER ARTHROSCOPY WITH OPEN ROTATOR CUFF REPAIR Right 2016   SHOULDER ARTHROSCOPY WITH ROTATOR CUFF REPAIR Left 1996   SHOULDER OPEN ROTATOR CUFF REPAIR Left 1997   took out 8inches of my collarbone   TEE WITHOUT CARDIOVERSION N/A 12/18/2016   Procedure: TRANSESOPHAGEAL ECHOCARDIOGRAM (TEE);  Surgeon: Salena Negri, MD;  Location: [facility] END[facility];  Service: Cardiovascular;  Laterality: N/A;   TRACHEOSTOMY  1964   at age 72 months old; for asthma   TRACHEOSTOMY CLOSURE     VENTRAL HERNIA REPAIR N/A 09/24/2020   Procedure: LAPAROSCOPIC VENTRAL WALL HERNIA REPAIR WITH MESH;  Surgeon: Sheldon Standing, MD;  Location: [facility];  Service: General;  Laterality: N/A;    Social history   reports that he quit smoking about 10 years ago. His smoking use included cigarettes. He started smoking about 49 years ago. He has a 60 pack-year smoking history. He has never been exposed to tobacco smoke. He has never used smokeless tobacco. He reports current alcohol use of about 3.0 standard drinks of alcohol per week. He reports that he does not use drugs.  Allergies  Allergen Reactions   Tylox [Oxycodone -Acetaminophen ] Hives, Rash and Other (See Comments)    Sweating, shaking, tremors and diaphoresis also   Gabapentin  Other (See Comments)    Gained weight and retained fluid   Tape Dermatitis, Rash and Other (See Comments)    States IV tape is fine     Family History  Problem Relation Age of Onset   Heart disease Mother        s/p 3V CABG   Cancer Father 74       lung cancer; +tobacco   Stroke Father    Cancer Maternal Grandmother  Heart disease Maternal Grandmother    Cancer Paternal Grandmother    Lupus Sister    Multiple sclerosis Sister    Anemia Daughter      Prior to Admission medications   Medication Sig Start Date End Date Taking? Authorizing Provider  albuterol  (PROVENTIL  HFA;VENTOLIN  HFA) 108 (90 BASE) MCG/ACT inhaler Inhale 2 puffs into the lungs every 6 (six) hours as needed for wheezing or shortness of breath.    Yes [provider]  amiodarone  (PACERONE ) 100 MG tablet Take 100 mg by mouth daily.   Yes [provider]  amoxicillin-clavulanate (AUGMENTIN) 875-125 MG tablet Take 1 tablet by mouth 2 (two) times daily. 07/31/24 08/10/24 Yes [provider]  apixaban  (ELIQUIS ) 5 MG TABS tablet Take 1 tablet (5 mg total) by mouth 2 (two) times daily. 10/19/20  Yes Patsy Lenis, MD  atorvastatin  (LIPITOR) 40 MG tablet Take 40 mg by mouth daily.   Yes [provider]  budesonide -glycopyrrolate -formoterol (BREZTRI  AEROSPHERE) 160-9-4.8 MCG/ACT AERO inhaler Inhale 2 puffs into the lungs in the morning and at bedtime. 07/03/24  Yes Pawar, Rahul, MD  clopidogrel  (PLAVIX ) 75 MG tablet Take 0.5 tablets (37.5 mg total) by mouth daily. Patient taking differently: Take 37.5 mg by mouth in the morning and at bedtime. 10/19/20  Yes Patsy Lenis, MD  furosemide  (LASIX ) 40 MG tablet Take 40 mg by mouth in the morning and at bedtime. 03/18/23  Yes [provider]  isosorbide mononitrate (IMDUR) 30 MG 24 hr tablet Take 30 mg by mouth daily.   Yes [provider]  Magnesium  Oxide (MAGOX 400 PO) Take 1 Piece by mouth See admin instructions. Chew 400 mg (1 gummie) by mouth in the morning and at bedtime   Yes [provider]  neomycin-polymyxin-hydrocortisone (CORTISPORIN) OTIC  solution Place 3 drops into the left ear 3 (three) times daily. 07/31/24 08/10/24 Yes [provider]  nitroGLYCERIN  (NITROSTAT ) 0.4 MG SL tablet Place 0.4 mg under the tongue every 5 (five) minutes as needed for chest pain. 03/03/23  Yes [provider]  omeprazole (PRILOSEC) 40 MG capsule Take 40 mg by mouth daily before breakfast.   Yes [provider]  phentermine 37.5 MG capsule Take 37.5 mg by mouth in the morning.   Yes [provider]  clobetasol  (TEMOVATE ) 0.05 % external solution Apply 1 Application topically 2 (two) times daily. Patient not taking: Reported on 08/09/2024 08/03/24   Paci, Karina M, MD  oxyCODONE  (OXY IR/ROXICODONE ) 5 MG immediate release tablet Take 1 tablet (5 mg total) by mouth every 4 (four) hours as needed for up to 8 doses for severe pain (pain score 7-10). Patient not taking: Reported on 08/09/2024 12/08/23   Paci, Karina M, MD  pantoprazole  (PROTONIX ) 40 MG tablet Take 1 tablet (40 mg total) by mouth daily as needed (for heartburn). Patient not taking: Reported on 08/09/2024 10/19/20   Patsy Lenis, MD  potassium chloride  (KLOR-CON ) 10 MEQ tablet Take 2 tablets (20 mEq total) by mouth daily. Patient not taking: Reported on 08/09/2024 05/30/23   Trine Raynell Moder, MD    Physical Exam: Vitals:   08/09/24 1115 08/09/24 1130 08/09/24 1145 08/09/24 1325  BP: 130/67 (!) 122/91 (!) 137/106 (!) 109/95  Pulse: (!) 50 (!) 104 (!) 44 (!) 103  Resp: 20 17 14 14   Temp:      TempSrc:      SpO2: 97% 94% 96% 97%    Constitutional: NAD, calm, comfortable.  Pleasant interactive. Vitals:   08/09/24 1115  08/09/24 1130 08/09/24 1145 08/09/24 1325  BP: 130/67 (!) 122/91 (!) 137/106 (!) 109/95  Pulse: (!) 50 (!) 104 (!) 44 (!) 103  Resp: 20 17 14 14   Temp:      TempSrc:      SpO2: 97% 94% 96% 97%   Eyes: PERRL, lids and conjunctivae normal ENMT: Mucous membranes are moist. Posterior pharynx clear of any exudate or lesions.Normal  dentition.  Neck: normal, supple, no masses, no thyromegaly Respiratory: clear to auscultation bilaterally, no wheezing, no crackles. Normal respiratory effort. No accessory muscle use.  Cardiovascular: Irregularly irregular, tachycardic. no murmurs / rubs / gallops.  Chronic venous stasis changes, nonpitting edema.  Palpable pedal pulses.  No carotid bruits.  Abdomen: no tenderness, no masses palpated. No hepatosplenomegaly. Bowel sounds positive.  Musculoskeletal: no clubbing / cyanosis. No joint deformity upper and lower extremities. Good ROM, no contractures. Normal muscle tone.  Skin: no rashes, lesions, ulcers. No induration Neurologic: CN 2-12 grossly intact. Sensation intact, DTR normal. Strength 5/5 in all 4.  Psychiatric: Normal judgment and insight. Alert and oriented x 3. Normal mood.     Labs on Admission: I have personally reviewed following labs and imaging studies  CBC: Recent Labs  Lab 08/09/24 1055 08/09/24 1101  WBC 8.8  --   NEUTROABS 5.6  --   HGB 14.5 15.0  HCT 44.1 44.0  MCV 89.6  --   PLT 269  --    Basic Metabolic Panel: Recent Labs  Lab 08/09/24 1101  NA 141  K 3.3*  CL 98  GLUCOSE 130*  BUN 13  CREATININE 1.10   GFR: CrCl cannot be calculated (Unknown ideal weight.). Liver Function Tests: No results for input(s): AST, ALT, ALKPHOS, BILITOT, PROT, ALBUMIN in the last 168 hours. No results for input(s): LIPASE, AMYLASE in the last 168 hours. No results for input(s): AMMONIA in the last 168 hours. Coagulation Profile: Recent Labs  Lab 08/09/24 1055  INR 1.1   Cardiac Enzymes: No results for input(s): CKTOTAL, CKMB, CKMBINDEX, TROPONINI in the last 168 hours. BNP (last 3 results) Recent Labs    08/09/24 1055  PROBNP 1,350.0*   HbA1C: No results for input(s): HGBA1C in the last 72 hours. CBG: No results for input(s): GLUCAP in the last 168 hours. Lipid Profile: No results for input(s): CHOL, HDL,  LDLCALC, TRIG, CHOLHDL, LDLDIRECT in the last 72 hours. Thyroid  Function Tests: No results for input(s): TSH, T4TOTAL, FREET4, T3FREE, THYROIDAB in the last 72 hours. Anemia Panel: No results for input(s): VITAMINB12, FOLATE, FERRITIN, TIBC, IRON, RETICCTPCT in the last 72 hours. Urine analysis:    Component Value Date/Time   COLORURINE YELLOW 02/03/2023 2020   APPEARANCEUR CLEAR 02/03/2023 2020   LABSPEC 1.031 (H) 02/03/2023 2020   PHURINE 6.0 02/03/2023 2020   GLUCOSEU NEGATIVE 02/03/2023 2020   HGBUR NEGATIVE 02/03/2023 2020   BILIRUBINUR NEGATIVE 02/03/2023 2020   KETONESUR NEGATIVE 02/03/2023 2020   PROTEINUR TRACE (A) 02/03/2023 2020   UROBILINOGEN 1.0 07/07/2014 2134   NITRITE NEGATIVE 02/03/2023 2020   LEUKOCYTESUR NEGATIVE 02/03/2023 2020    Radiological Exams on Admission: CT Angio Chest/Abd/Pel for Dissection W and/or Wo Contrast Result Date: 08/09/2024 EXAM: CTA CHEST, ABDOMEN AND PELVIS WITH AND WITHOUT CONTRAST 08/09/2024 12:12:52 PM TECHNIQUE: CTA of the chest was performed with and without the administration of 100 mL of iohexol  (OMNIPAQUE ) 350 MG/ML injection. CTA of the abdomen and pelvis was performed with the administration of 100 mL of iohexol  (OMNIPAQUE ) 350 MG/ML injection. Multiplanar reformatted  images are provided for review. MIP images are provided for review. Automated exposure control, iterative reconstruction, and/or weight based adjustment of the mA/kV was utilized to reduce the radiation dose to as low as reasonably achievable. COMPARISON: 06/02/2024 and previous. CLINICAL HISTORY: Acute aortic syndrome (AAS) suspected. Claudication or leg ischemia, right lower extremity claudication. FINDINGS: VASCULATURE: AORTA: Atheromatous thoracic aorta with minimal calcified plaque. No abdominal aortic aneurysm. No dissection. PULMONARY ARTERIES: No pulmonary embolism with the limits of this exam. GREAT VESSELS OF AORTIC ARCH: No acute  finding. No dissection. No arterial occlusion or significant stenosis. CELIAC TRUNK: No acute finding. No occlusion or significant stenosis. SUPERIOR MESENTERIC ARTERY: No acute finding. No occlusion or significant stenosis. INFERIOR MESENTERIC ARTERY: No acute finding. No occlusion or significant stenosis. RENAL ARTERIES: Two Right renal arteries, superior dominant, both patent. Aberrant trunk arising below the level of the IMA supplying both kidneys, widely patent proximally. Single left renal artery, patent. ILIAC ARTERIES: No acute finding. No occlusion or significant stenosis. CHEST: MEDIASTINUM: Subcentimeter prevascular and precarinal lymph nodes. Moderate scattered coronary calcifications. The heart and pericardium demonstrate no acute abnormality. LUNGS AND PLEURA: The lungs are without acute process. No focal consolidation or pulmonary edema. No evidence of pleural effusion or pneumothorax. THORACIC BONES AND SOFT TISSUES: Vertebral endplate spurring at multiple levels in the lower thoracic spine. No acute soft tissue abnormality. ABDOMEN AND PELVIS: LIVER: The liver is unremarkable. GALLBLADDER AND BILE DUCTS: Gallbladder is unremarkable. No biliary ductal dilatation. SPLEEN: The spleen is unremarkable. PANCREAS: The pancreas is unremarkable. ADRENAL GLANDS: Bilateral adrenal glands demonstrate no acute abnormality. KIDNEYS, URETERS AND BLADDER: Horseshoe kidney. No stones in the kidneys or ureters. No hydronephrosis. No perinephric or periureteral stranding. Urinary bladder is unremarkable. GI AND BOWEL: Stomach and duodenal sweep demonstrate no acute abnormality. There is no bowel obstruction. No abnormal bowel wall thickening or distension. REPRODUCTIVE: Prostate enlargement with central coarse calcifications. PERITONEUM AND RETROPERITONEUM: No ascites or free air. LYMPH NODES: No lymphadenopathy. ABDOMINAL BONES AND SOFT TISSUES: Mild facet DJD L4 - S1 right worse than left. No acute soft tissue  abnormality. IMPRESSION: 1. No evidence of acute aortic syndrome, dissection, or aneurysm. 2. No pulmonary embolism. 3. Horseshoe kidney. No stones or hydronephrosis. 4. Prostatomegaly. Electronically signed by: Katheleen Faes MD 08/09/2024 12:45 PM EDT RP Workstation: [id number]   CT Angio Aortobifemoral W and/or Wo Contrast Result Date: 08/09/2024 EXAM: CTA AORTOBIFEM WITH RUNOFF 08/09/2024 12:12:52 PM TECHNIQUE: Contrast-enhanced computed tomography angiography of the lower extremity was performed with multiplanar reconstructions. Maximum intensity projection images were created on a separate workstation and reviewed. Automated exposure control, iterative reconstruction, and/or weight based adjustment of the mA/kV was utilized to reduce the radiation dose to as low as reasonably achievable. CONTRAST: 100mL iohexol  (OMNIPAQUE ) 350 MG/ML injection. COMPARISON: None available. CLINICAL HISTORY: Claudication or leg ischemia; Right lower extremity claudication. Acute aortic syndrome (AAS) suspected; Claudication or leg ischemia, Right lower extremity claudication. FINDINGS: ARTERIAL: AORTA: Moderate calcified nonocclusive plaque in the visualized distal abdominal aorta without aneurysm. RIGHT LOWER EXTREMITY: RIGHT COMMON ILIAC ARTERY: Mildly atheromatous, patent. Mild origin stenosis right internal iliac artery, patent distally. RIGHT EXTERNAL ILIAC ARTERY: No significant stenosis or vessel occlusion. RIGHT COMMON FEMORAL ARTERY: Minimal plaque in the distal common femoral artery. RIGHT SUPERFICIAL FEMORAL ARTERY: Mild scattered calcified plaque without high-grade stenosis. RIGHT POPLITEAL ARTERY: Minimal plaque in the distal right popliteal artery without stenosis. RIGHT TIBIOPERONEAL TRUNK: atheromatous but apparently contiguous 3-vessel right tibial runoff. RIGHT ANTERIOR TIBIAL ARTERY: Flow is identified in the  anterior tibial artery to the ankle. Flow identified in the dorsalis pedis artery. RIGHT PERONEAL  ARTERY: Flow is identified to the ankle. RIGHT POSTERIOR TIBIAL ARTERY: No significant stenosis or vessel occlusion. Posterior tibial artery flow is present into the hindfoot. LEFT LOWER EXTREMITY: LEFT COMMON ILIAC ARTERY: Mild plaque. No plaque in the internal iliac. LEFT EXTERNAL ILIAC ARTERY: No significant stenosis or vessel occlusion. LEFT COMMON FEMORAL ARTERY: Mild plaque in the distal common femoral artery. LEFT SUPERFICIAL FEMORAL ARTERY: No significant stenosis or vessel occlusion. LEFT POPLITEAL ARTERY: Minimal plaque. LEFT TIBIOPERONEAL TRUNK: Minimally atheromatous but apparently contiguous 3-vessel tibial runoff, with some venous opacification limiting evaluation distally. LEFT ANTERIOR TIBIAL ARTERY: Flow is identified in the anterior tibial artery to the ankle. Flow identified in the dorsalis pedis artery. LEFT PERONEAL ARTERY: Flow is identified to the ankle. LEFT POSTERIOR TIBIAL ARTERY: No significant stenosis or vessel occlusion. Posterior tibial artery flow is present into the hindfoot. BONES AND SOFT TISSUES: Horseshoe kidney, incompletely visualized. Coarse calcifications in the prostate, mildly enlarged. No significant osseous abnormalities seen within the field of view. IMPRESSION: 1. Moderate calcified nonocclusive plaque in the distal abdominal aorta without aneurysm. 2. No hemodynamically significant lower extremity arterial occlusive disease identified. Electronically signed by: Katheleen Faes MD 08/09/2024 12:30 PM EDT RP Workstation: [id number]   DG Chest Portable 1 View Result Date: 08/09/2024 EXAM: 1 VIEW XRAY OF THE CHEST 08/09/2024 10:58:38 AM COMPARISON: 07/03/2024 CLINICAL HISTORY: Afib Rvr. Pt arrived via Pov, c/o states he woke feeling weak and right leg has been cold and hot. Hr 140s in triage. Hx of afib, states needed cardioversion the last time he had this issue. FINDINGS: LUNGS AND PLEURA: No focal pulmonary opacity. No pulmonary edema. No pleural effusion. No  pneumothorax. HEART AND MEDIASTINUM: Numerous leads and wires over chest are noted. Normal heart size and mediastinal contours . BONES AND SOFT TISSUES: Lordotic positioning noted. No acute osseous abnormality. IMPRESSION: 1. No acute cardiopulmonary process. Electronically signed by: Norleen Kil MD 08/09/2024 12:07 PM EDT RP Workstation: [id number]    EKG: Independently reviewed.  A-fib with RVR, ventricular rate 120  Assessment/Plan Principal Problem:   Atrial fibrillation with RVR (HCC) Active Problems:   COPD (chronic obstructive pulmonary disease) (HCC)   GERD (gastroesophageal reflux disease)     1.  Paroxysmal A-fib with RVR: Symptomatic on arrival.  Heart rate is more than 120 associated with dizziness and lightheadedness.  Currently stabilized. At home on amiodarone  100 mg daily, Eliquis  5 mg twice daily. Given bolus dose of amiodarone  150 mg and now remains on maintenance loading dose of amiodarone .  Will continue.  Admit to stepdown unit.  Resume Eliquis . Will consult cardiology, patient may benefit with cardioversion with previous 2 cardioversions. Check TSH , magnesium , phosphorus and aggressively replace potassium. Patient does have history of TIA, no neurological deficits.  2.  Chest pain: Atypical.  Negative troponins.  Improved.  Nitro as needed.  Holding Imdur to make room for rate control medications.  Resume half dose of Plavix  that he was taking. Coronary catheterization last year with mild obstructive coronary artery disease. Patient on Imdur, holding today.  Continue with statin.  Continue Plavix . CT angiogram of the chest abdomen pelvis with runoff, negative for dissection or embolism.  3.  Chronic diastolic heart failure: No evidence of exacerbation.  Patient does have chronic leg edema and lymphedema, he stands all day.  He was given 80 mg of Lasix  in the ER.  Will give him extra dose of potassium.  Holding Lasix  today.  Will reevaluate to restart Lasix   tomorrow. Known ejection fraction 60 to 65%.  4.  GERD: On PPI.  Continue.  5.  COPD: Without exacerbation.  Patient on albuterol  as needed.  He is on Breztri  that he will continue.  6.  Chronic venous stasis: Will evaluate for Lasix  resumption tomorrow.  Compression stockings.   DVT prophylaxis: Eliquis  Code Status: Full code Family Communication: None at the bedside Disposition Plan: Home when stable Consults called: Cardiology Admission status: Observation, amiodarone  infusion needs to go to stepdown unit.   Renato Applebaum MD [facility]

## 2024-08-10 ENCOUNTER — Observation Stay (HOSPITAL_COMMUNITY)

## 2024-08-10 DIAGNOSIS — G4733 Obstructive sleep apnea (adult) (pediatric): Secondary | ICD-10-CM | POA: Diagnosis present

## 2024-08-10 DIAGNOSIS — Z713 Dietary counseling and surveillance: Secondary | ICD-10-CM | POA: Diagnosis not present

## 2024-08-10 DIAGNOSIS — I4819 Other persistent atrial fibrillation: Secondary | ICD-10-CM | POA: Diagnosis present

## 2024-08-10 DIAGNOSIS — R0789 Other chest pain: Secondary | ICD-10-CM | POA: Diagnosis present

## 2024-08-10 DIAGNOSIS — I5032 Chronic diastolic (congestive) heart failure: Secondary | ICD-10-CM

## 2024-08-10 DIAGNOSIS — I10 Essential (primary) hypertension: Secondary | ICD-10-CM | POA: Diagnosis not present

## 2024-08-10 DIAGNOSIS — K219 Gastro-esophageal reflux disease without esophagitis: Secondary | ICD-10-CM | POA: Diagnosis present

## 2024-08-10 DIAGNOSIS — I4891 Unspecified atrial fibrillation: Secondary | ICD-10-CM

## 2024-08-10 DIAGNOSIS — I959 Hypotension, unspecified: Secondary | ICD-10-CM | POA: Diagnosis present

## 2024-08-10 DIAGNOSIS — J4489 Other specified chronic obstructive pulmonary disease: Secondary | ICD-10-CM | POA: Diagnosis present

## 2024-08-10 DIAGNOSIS — Z85828 Personal history of other malignant neoplasm of skin: Secondary | ICD-10-CM | POA: Diagnosis not present

## 2024-08-10 DIAGNOSIS — I878 Other specified disorders of veins: Secondary | ICD-10-CM | POA: Diagnosis present

## 2024-08-10 DIAGNOSIS — Z886 Allergy status to analgesic agent status: Secondary | ICD-10-CM | POA: Diagnosis not present

## 2024-08-10 DIAGNOSIS — Z7902 Long term (current) use of antithrombotics/antiplatelets: Secondary | ICD-10-CM | POA: Diagnosis not present

## 2024-08-10 DIAGNOSIS — I2510 Atherosclerotic heart disease of native coronary artery without angina pectoris: Secondary | ICD-10-CM | POA: Diagnosis present

## 2024-08-10 DIAGNOSIS — I5042 Chronic combined systolic (congestive) and diastolic (congestive) heart failure: Secondary | ICD-10-CM | POA: Diagnosis not present

## 2024-08-10 DIAGNOSIS — I890 Lymphedema, not elsewhere classified: Secondary | ICD-10-CM | POA: Diagnosis present

## 2024-08-10 DIAGNOSIS — R0902 Hypoxemia: Secondary | ICD-10-CM | POA: Diagnosis not present

## 2024-08-10 DIAGNOSIS — I480 Paroxysmal atrial fibrillation: Secondary | ICD-10-CM | POA: Diagnosis not present

## 2024-08-10 DIAGNOSIS — E1151 Type 2 diabetes mellitus with diabetic peripheral angiopathy without gangrene: Secondary | ICD-10-CM | POA: Diagnosis present

## 2024-08-10 DIAGNOSIS — Z8249 Family history of ischemic heart disease and other diseases of the circulatory system: Secondary | ICD-10-CM | POA: Diagnosis not present

## 2024-08-10 DIAGNOSIS — Z87891 Personal history of nicotine dependence: Secondary | ICD-10-CM | POA: Diagnosis not present

## 2024-08-10 DIAGNOSIS — I5020 Unspecified systolic (congestive) heart failure: Secondary | ICD-10-CM | POA: Diagnosis not present

## 2024-08-10 DIAGNOSIS — Z6837 Body mass index (BMI) 37.0-37.9, adult: Secondary | ICD-10-CM | POA: Diagnosis not present

## 2024-08-10 DIAGNOSIS — E785 Hyperlipidemia, unspecified: Secondary | ICD-10-CM | POA: Diagnosis present

## 2024-08-10 DIAGNOSIS — Z79899 Other long term (current) drug therapy: Secondary | ICD-10-CM | POA: Diagnosis not present

## 2024-08-10 DIAGNOSIS — E66812 Obesity, class 2: Secondary | ICD-10-CM | POA: Diagnosis present

## 2024-08-10 DIAGNOSIS — I110 Hypertensive heart disease with heart failure: Secondary | ICD-10-CM | POA: Diagnosis present

## 2024-08-10 DIAGNOSIS — Z7901 Long term (current) use of anticoagulants: Secondary | ICD-10-CM | POA: Diagnosis not present

## 2024-08-10 LAB — CBC
HCT: 37.8 % — ABNORMAL LOW (ref 39.0–52.0)
Hemoglobin: 12.3 g/dL — ABNORMAL LOW (ref 13.0–17.0)
MCH: 29.1 pg (ref 26.0–34.0)
MCHC: 32.5 g/dL (ref 30.0–36.0)
MCV: 89.4 fL (ref 80.0–100.0)
Platelets: 218 K/uL (ref 150–400)
RBC: 4.23 MIL/uL (ref 4.22–5.81)
RDW: 13 % (ref 11.5–15.5)
WBC: 7.1 K/uL (ref 4.0–10.5)
nRBC: 0 % (ref 0.0–0.2)

## 2024-08-10 LAB — COMPREHENSIVE METABOLIC PANEL WITH GFR
ALT: 16 U/L (ref 0–44)
AST: 18 U/L (ref 15–41)
Albumin: 3.8 g/dL (ref 3.5–5.0)
Alkaline Phosphatase: 88 U/L (ref 38–126)
Anion gap: 10 (ref 5–15)
BUN: 12 mg/dL (ref 8–23)
CO2: 28 mmol/L (ref 22–32)
Calcium: 9 mg/dL (ref 8.9–10.3)
Chloride: 99 mmol/L (ref 98–111)
Creatinine, Ser: 0.93 mg/dL (ref 0.61–1.24)
GFR, Estimated: >60 mL/min (ref >60)
Glucose, Bld: 113 mg/dL — ABNORMAL HIGH (ref 70–99)
Potassium: 3.6 mmol/L (ref 3.5–5.1)
Sodium: 137 mmol/L (ref 135–145)
Total Bilirubin: 0.5 mg/dL (ref 0.0–1.2)
Total Protein: 6.5 g/dL (ref 6.5–8.1)

## 2024-08-10 LAB — ECHOCARDIOGRAM COMPLETE
AR max vel: 2.41 cm2
AV Area VTI: 2.2 cm2
AV Area mean vel: 2.66 cm2
AV Mean grad: 5 mmHg
AV Peak grad: 8.6 mmHg
Ao pk vel: 1.47 m/s
Calc EF: 48.3 %
Height: 68 in
S' Lateral: 3.9 cm
Single Plane A2C EF: 51.9 %
Single Plane A4C EF: 42.8 %
Weight: 3943.59 [oz_av]

## 2024-08-10 LAB — PHOSPHORUS: Phosphorus: 3.4 mg/dL (ref 2.5–4.6)

## 2024-08-10 LAB — MAGNESIUM: Magnesium: 2.1 mg/dL (ref 1.7–2.4)

## 2024-08-10 MED ORDER — POTASSIUM CHLORIDE ER 10 MEQ PO TBCR
20.0000 meq | EXTENDED_RELEASE_TABLET | Freq: Every day | ORAL | Status: DC
  Administered 2024-08-10 – 2024-08-13 (×4): 20 meq via ORAL
  Filled 2024-08-10 (×7): qty 2

## 2024-08-10 MED ORDER — FUROSEMIDE 10 MG/ML IJ SOLN
60.0000 mg | Freq: Once | INTRAMUSCULAR | Status: AC
  Administered 2024-08-10: 60 mg via INTRAVENOUS
  Filled 2024-08-10: qty 6

## 2024-08-10 MED ORDER — FUROSEMIDE 10 MG/ML IJ SOLN
40.0000 mg | Freq: Once | INTRAMUSCULAR | Status: AC
Start: 2024-08-10 — End: 2024-08-10
  Administered 2024-08-10: 40 mg via INTRAVENOUS
  Filled 2024-08-10: qty 4

## 2024-08-10 MED ORDER — POTASSIUM CHLORIDE 20 MEQ PO PACK
20.0000 meq | PACK | Freq: Once | ORAL | Status: AC
  Administered 2024-08-10: 20 meq via ORAL
  Filled 2024-08-10: qty 1

## 2024-08-10 MED ORDER — FUROSEMIDE 40 MG PO TABS: 40.0000 mg | ORAL_TABLET | Freq: Every day | ORAL | Status: DC

## 2024-08-10 MED ORDER — PERFLUTREN LIPID MICROSPHERE
1.0000 mL | INTRAVENOUS | Status: AC | PRN
  Administered 2024-08-10: 2 mL via INTRAVENOUS

## 2024-08-10 MED ORDER — FUROSEMIDE 10 MG/ML IJ SOLN: 40.0000 mg | Freq: Two times a day (BID) | INTRAMUSCULAR | Status: DC

## 2024-08-10 NOTE — H&P (View-Only) (Signed)
 Cardiology Consultation   Patient ID: SHENOUDA GENOVA MRN: [id number]; DOB: 04/24/1961  Admit date: 08/09/2024 Date of Consult: 08/10/2024  PCP:  [name], MD   [facility] Providers Cardiologist:  None [facility]  Patient Profile: [name] is a 63 y.o. 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 Raenelle.  History of Present Illness: [name] is previous cardiac history with Dr. Talitha group but now has been followed  by [facility] cardiology.  He has history of atrial fibrillation with cardioversion in 2018.   Patient says he had an emergent cardioversion in interval though I cannot find it in record   Hs has been followed over the past few years at [facility] cardiology  Had some  complaints of chest pain.  Due to elevated CAC score he had cardiac catheterization 06/2023 that demonstrated mild nonobstructive disease.  For his atrial fibrillation he has been on amiodarone  since 2015 by his report and has been on Eliquis  and Plavix  37.5 mg.  He was just seen 03/2024 by their group and noted to be in sinus rhythm at that time.  He was referred to GI for his atypical complaints of chest pain.  Underwent esophageal dilitation in  July 2025 [facility])  Patient reports that he normally is not able to tell when he is in atrial fibrillation however he notes an episode yesterday while he was at work he became diaphoretic, started having tachycardia/palpitations and started to feel like he was going to pass out with no syncope.  He thinks that he had another episode about a week ago but this only lasted about an hour.  Otherwise he has done well since he was last seen.  Not reporting any chest pain now or shortness of breath or any exertional limitations.  He works at [employer] here in town.  Prior smoker but  stopped in 2015 when he had a stroke.  No drugs or alcohol.  Reports 100% compliancy with all of his medications and has not missed any doses of Eliquis .  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  No PE.  No signs of vascular congestion.  Normal lactic acid.  Troponins negative x 2.  TSH normal.  proBNP 1350.  No electrolyte derangements.  Normal creatinine and hemoglobin 12.3.  Past Medical History:  Diagnosis Date   Aneurysm    brain   Arthritis    Atrial fibrillation (HCC)    Bipolar disorder (HCC)    was on meds but was taken off 2 yrs ago and none since   Burning pain    in both legs-seeing Dr.Sethi for this   CAD (coronary artery disease)    Cataracts, bilateral    Childhood asthma    Last asthma attack at age 55; History of trach at 16 months (12/17/2016)   Chronic kidney disease 2009   Chronic lower back pain    COPD (chronic obstructive pulmonary disease) (HCC) 06/2012   uses Spiriva  and Albuterol  daily as needed   Depression    Family history of adverse reaction to anesthesia    Mom and sister have PONV   GERD (gastroesophageal reflux disease)    H/O hiatal hernia    Headache(784.0)    q 2-3 weeks; daily last 2 wks (  12/17/2016)   Hyperlipidemia    takes Ramipril  daily   Hypertension    takes Ramipril  daily   Joint pain    Joint swelling    Middle cerebral artery aneurysm    right   Nocturia    Numbness    both arms    Pneumonia    hx of-2014   PONV (postoperative nausea and vomiting)    Pt reports nausea only.   Sleep apnea    dx just this past week   08/21/2017   Squamous cell carcinoma of skin    Stroke (HCC) 2015   drag left foot more since; have to wear glasses now (12/17/2016)   Type II diabetes mellitus (HCC)    takes Metformin  daily   dx 2015   Umbilical hernia    Urinary frequency    Wears dentures    Wears glasses    and contact lenses    Past Surgical History:  Procedure Laterality Date   ANEURYSM COILING   2015   CARDIOVERSION N/A 12/18/2016   Procedure: CARDIOVERSION;  Surgeon: Salena Negri, MD;  [facility]: [facility];  Service: Cardiovascular;  Laterality: N/A;   COLONOSCOPY     COLONOSCOPY N/A 04/08/2018   Procedure: COLONOSCOPY;  Surgeon: Rollin Dover, MD;  Location: [facility];  Service: Endoscopy;  Laterality: N/A;   COLONOSCOPY N/A 10/18/2020   Procedure: COLONOSCOPY;  Surgeon: Rollin Dover, MD;  [facility];  Service: Endoscopy;  Laterality: N/A;   ESOPHAGOGASTRODUODENOSCOPY N/A 04/08/2018   Procedure: ESOPHAGOGASTRODUODENOSCOPY (EGD);  Surgeon: Rollin Dover, MD;  Location: [facility];  Service: Endoscopy;  Laterality: N/A;   ESOPHAGOGASTRODUODENOSCOPY N/A 04/21/2024   Procedure: EGD (ESOPHAGOGASTRODUODENOSCOPY);  Surgeon: Rollin Dover, MD;  Location: [facility];  Service: Gastroenterology;  Laterality: N/A;   EXCISIONAL HEMORRHOIDECTOMY  1980s   soon after rectal OR   GANGLION CYST EXCISION Right    HEMOSTASIS CLIP PLACEMENT  10/18/2020   Procedure: HEMOSTASIS CLIP PLACEMENT;  Surgeon: Rollin Dover, MD;  [facility];  Service: Endoscopy;;   HOT HEMOSTASIS N/A 04/08/2018   Procedure: HOT HEMOSTASIS (ARGON PLASMA COAGULATION/BICAP);  Surgeon: Rollin Dover, MD;  Location: [facility];  Service: Endoscopy;  Laterality: N/A;   HOT HEMOSTASIS N/A 10/18/2020   Procedure: HOT HEMOSTASIS (ARGON PLASMA COAGULATION/BICAP);  Surgeon: Rollin Dover, MD;  Location: [facility];  Service: Endoscopy;  Laterality: N/A;   IR 3D INDEPENDENT WKST  05/10/2017   IR 3D INDEPENDENT WKST  06/28/2017   IR ANGIO INTRA EXTRACRAN SEL COM CAROTID INNOMINATE BILAT MOD SED  04/22/2017   IR ANGIO INTRA EXTRACRAN SEL COM CAROTID INNOMINATE BILAT MOD SED  11/22/2017   IR ANGIO INTRA EXTRACRAN SEL COM CAROTID INNOMINATE BILAT MOD SED  05/13/2018   IR ANGIO INTRA EXTRACRAN SEL INTERNAL CAROTID UNI L MOD SED  12/27/2017   IR ANGIO INTRA EXTRACRAN SEL INTERNAL CAROTID UNI R MOD SED  05/10/2017   IR  ANGIO INTRA EXTRACRAN SEL INTERNAL CAROTID UNI R MOD SED  06/28/2017   IR ANGIO VERTEBRAL SEL SUBCLAVIAN INNOMINATE UNI L MOD SED  04/22/2017   IR ANGIO VERTEBRAL SEL SUBCLAVIAN INNOMINATE UNI R MOD SED  06/28/2017   IR ANGIO VERTEBRAL SEL SUBCLAVIAN INNOMINATE UNI R MOD SED  11/22/2017   IR ANGIO VERTEBRAL SEL SUBCLAVIAN INNOMINATE UNI R MOD SED  05/13/2018   IR ANGIO VERTEBRAL SEL VERTEBRAL UNI R MOD SED  04/22/2017   IR ANGIOGRAM FOLLOW UP STUDY  05/10/2017   IR ANGIOGRAM FOLLOW UP STUDY  06/28/2017   IR  ANGIOGRAM FOLLOW UP STUDY  12/27/2017   IR NEURO EACH ADD'L AFTER BASIC UNI LEFT (MS)  12/27/2017   IR NEURO EACH ADD'L AFTER BASIC UNI RIGHT (MS)  05/10/2017   IR NEURO EACH ADD'L AFTER BASIC UNI RIGHT (MS)  06/28/2017   IR RADIOLOGIST EVAL & MGMT  04/30/2017   IR RADIOLOGIST EVAL & MGMT  06/15/2017   IR RADIOLOGIST EVAL & MGMT  07/13/2017   IR RADIOLOGIST EVAL & MGMT  01/11/2018   IR TRANSCATH/EMBOLIZ  05/10/2017   IR TRANSCATH/EMBOLIZ  06/28/2017   IR TRANSCATH/EMBOLIZ  12/27/2017   MULTIPLE TOOTH EXTRACTIONS     POLYPECTOMY  04/08/2018   Procedure: POLYPECTOMY;  Surgeon: Rollin Dover, MD;  [facility];  Service: Endoscopy;;   RADIOLOGY WITH ANESTHESIA N/A 08/08/2014   Procedure: RADIOLOGY WITH ANESTHESIA EMBOLIZATION;  Surgeon: Medication Radiologist, MD;  [facility];  Service: Radiology;  Laterality: N/A;   RADIOLOGY WITH ANESTHESIA N/A 11/21/2014   Procedure: RADIOLOGY WITH ANESTHESIA;  Surgeon: Thyra MARLA Nash, MD;  [facility];  Service: Radiology;  Laterality: N/A;   RADIOLOGY WITH ANESTHESIA N/A 02/27/2015   Procedure: RADIOLOGY WITH ANESTHESIA;  Surgeon: Thyra Nash, MD;  [facility];  Service: Radiology;  Laterality: N/A;   RADIOLOGY WITH ANESTHESIA N/A 09/25/2015   Procedure: RADIOLOGY WITH ANESTHESIA;  Surgeon: Thyra Nash, MD;  [facility];  Service: Radiology;  Laterality: N/A;   RADIOLOGY WITH ANESTHESIA N/A 05/10/2017   Procedure: EMBOLIZATION;   Surgeon: Nash Thyra, MD;  [facility];  Service: Radiology;  Laterality: N/A;   RADIOLOGY WITH ANESTHESIA N/A 06/28/2017   Procedure: RADIOLOGY WITH ANESTHESIA-EMBOLIZATION;  Surgeon: Nash Thyra, MD;  [facility];  Service: Radiology;  Laterality: N/A;   RADIOLOGY WITH ANESTHESIA N/A 08/26/2017   Procedure: RADIOLOGY WITH ANESTHESIA EMBOLIZATION;  Surgeon: Nash Thyra, MD;  [facility];  Service: Radiology;  Laterality: N/A;   RADIOLOGY WITH ANESTHESIA N/A 11/22/2017   Procedure: EMBOLIZATION;  Surgeon: Nash Thyra, MD;  [facility];  Service: Radiology;  Laterality: N/A;   RADIOLOGY WITH ANESTHESIA N/A 12/27/2017   Procedure: EMBOLIZATION;  Surgeon: Nash Thyra, MD;  [facility];  Service: Radiology;  Laterality: N/A;   RECTAL SURGERY  1980s   tore it lifting heavy furniture; sewed it back together   SHOULDER ARTHROSCOPY WITH OPEN ROTATOR CUFF REPAIR Right 2016   SHOULDER ARTHROSCOPY WITH ROTATOR CUFF REPAIR Left 1996   SHOULDER OPEN ROTATOR CUFF REPAIR Left 1997   took out 8inches of my collarbone   TEE WITHOUT CARDIOVERSION N/A 12/18/2016   Procedure: TRANSESOPHAGEAL ECHOCARDIOGRAM (TEE);  Surgeon: Salena Negri, MD;  Location: [facility]COPY;  Service: Cardiovascular;  Laterality: N/A;   TRACHEOSTOMY  1964   at age 53 months old; for asthma   TRACHEOSTOMY CLOSURE     VENTRAL HERNIA REPAIR N/A 09/24/2020   Procedure: LAPAROSCOPIC VENTRAL WALL HERNIA REPAIR WITH MESH;  Surgeon: Sheldon Standing, MD;  [facility];  Service: General;  Laterality: N/A;    Scheduled Meds:  apixaban   5 mg Oral BID   atorvastatin   40 mg Oral Daily   budesonide -glycopyrrolate -formoterol  2 puff Inhalation BID   Chlorhexidine  Gluconate Cloth  6 each Topical Daily   clopidogrel   37.5 mg Oral Daily   furosemide   40 mg Intravenous Q12H   pantoprazole   40 mg Oral Daily   potassium chloride   40 mEq Oral BID   Continuous Infusions:  sodium chloride       amiodarone  30 mg/hr (08/10/24 0800)  PRN Meds: sodium chloride , albuterol , nitroGLYCERIN , mouth rinse  Allergies:    Allergies  Allergen Reactions   Tylox [Oxycodone -Acetaminophen ] Hives, Rash and Other (See Comments)    Sweating, shaking, tremors and diaphoresis also   Gabapentin  Other (See Comments)    Gained weight and retained fluid   Tape Dermatitis, Rash and Other (See Comments)    States IV tape is fine    Social History:   Social History   Socioeconomic History   Marital status: Divorced    Spouse name: not together since 2007   Number of children: 3   Years of education: 10   Highest education level: Not on file  Occupational History   Occupation: Corporate Treasurer: [employer]    Comment: airport   Tobacco Use   Smoking status: Former    Current packs/day: 0.00    Average packs/day: 1.5 packs/day for 40.0 years (60.0 ttl pk-yrs)    Types: Cigarettes    Start date: 01/15/1975    Quit date: 06/02/2014    Years since quitting: 10.1    Passive exposure: Never   Smokeless tobacco: Never   Tobacco comments:    Smoked for about 40 years, smoked 1.5ppd  Vaping Use   Vaping status: Former  Substance and Sexual Activity   Alcohol use: Yes    Alcohol/week: 3.0 standard drinks of alcohol    Types: 3 Cans of beer per week    Comment: drinks 3 - 12oz bottles every monday, during shooting pool   Drug use: No   Sexual activity: Never    Partners: Female    Comment: partner has had surgery to prevent pregnancy  Other Topics Concern   Not on file  Social History Narrative   Lives with his son and his mother.  His son has no contact with the son's mother, though the patient is not legally separated from her.  She has a history of drug use and has been in prison several times.   Social Drivers of Corporate Investment Banker Strain: Low Risk  (07/30/2024)   Received from [facility]   Overall Financial Resource Strain (CARDIA)    How hard is it for you  to pay for the very basics like food, housing, medical care, and heating?: Not hard at all  Food Insecurity: No Food Insecurity (08/09/2024)   Hunger Vital Sign    Worried About Running Out of Food in the Last Year: Never true    Ran Out of Food in the Last Year: Never true  Recent Concern: Food Insecurity - Food Insecurity Present (07/30/2024)   Received from [facility]   Hunger Vital Sign    Within the past 12 months, you worried that your food would run out before you got the money to buy more.: Sometimes true    Within the past 12 months, the food you bought just didn't last and you didn't have money to get more.: Never true  Transportation Needs: No Transportation Needs (08/09/2024)   PRAPARE - Administrator, Civil Service (Medical): No    Lack of Transportation (Non-Medical): No  Physical Activity: Inactive (07/30/2024)   Received from [facility]   Exercise Vital Sign    On average, how many days per week do you engage in moderate to strenuous exercise (like a brisk walk)?: 0 days    Minutes of Exercise per Session: Not on file  Stress: Stress Concern Present (07/30/2024)   Received from [facility]   [employer] of Occupational Health - Occupational Stress Questionnaire    Do you feel stress - tense, restless, nervous, or anxious, or unable to sleep at night because your mind is troubled all the time - these days?: Rather much  Social Connections: Socially Integrated (07/30/2024)   Received from [facility]   Social Network    How would you rate your social network (family, work, friends)?: Good participation with social networks  Intimate Partner Violence: Not At Risk (08/09/2024)   Humiliation, Afraid, Rape, and Kick questionnaire    Fear of Current or Ex-Partner: No    Emotionally Abused: No    Physically Abused: No    Sexually Abused: No    Family History:   Family History  Problem Relation Age of Onset   Heart disease Mother        s/p 3V  CABG   Cancer Father 40       lung cancer; +tobacco   Stroke Father    Cancer Maternal Grandmother    Heart disease Maternal Grandmother    Cancer Paternal Grandmother    Lupus Sister    Multiple sclerosis Sister    Anemia Daughter      ROS:  Please see the history of present illness. All other ROS reviewed and negative.     Physical Exam/Data: Vitals:   08/10/24 0600 08/10/24 0619 08/10/24 0745 08/10/24 0800  BP: 111/74   (!) 137/97  Pulse: (!) 101 (!) 109  (!) 114  Resp:  20  15  Temp:   97.6 F (36.4 C)   TempSrc:   Oral   SpO2: 92% (!) 72%  95%  Weight:      Height:        Intake/Output Summary (Last 24 hours) at 08/10/2024 0842 Last data filed at 08/10/2024 0800 Gross per 24 hour  Intake 964.53 ml  Output 300 ml  Net 664.53 ml      08/09/2024    4:17 PM 07/03/2024    9:44 AM 04/21/2024    8:56 AM  Last 3 Weights  Weight (lbs) 246 lb 7.6 oz 262 lb 242 lb 8.1 oz  Weight (kg) 111.8 kg 118.842 kg 110 kg     Body mass index is 37.48 kg/m.  General:  Obese 63 yo in no acute distress HEENT: normal Neck: no JVD Vascular: No carotid bruits; Distal pulses 2+ bilaterally Cardiac: IRRR; distant heart sounds Lungs:  clear to auscultation bilaterally, no wheezing, rhonchi or rales  Abd: soft, nontender, no hepatomegaly  Ext: no edema  Chronic skin changes   Varicosities  Musculoskeletal:  No deformities, BUE and BLE strength normal and equal Skin: warm and dry  Neuro:  CNs 2-12 intact, no focal abnormalities noted Psych:  Normal affect   EKG:  The EKG was personally reviewed and demonstrates: Atrial fibrillation heart rate 107.  Right bundle branch block. Telemetry:  Telemetry was personally reviewed and demonstrates: Atrial fibrillation heart rate around 105  Relevant CV Studies: Cardiac catheterization 07-10-23 1. Central Aortic Pressure -128/66 mmHg. Mean 91 mmHg  2. Left Ventricular Pressure-130/7 mmHg  Additional comments on on hemodynamics:  LVEDP 19  mmHg.   Coronary Angiography:   Antatomically right dominant  Mild to moderate fluoroscopic coronary calcification   Left Main: Angiographically normal.  Bifurcates distally.  Left anterior descending artery: Mild diffuse luminal irregularities.   Tortuous in the mid to distal segment.  Small vessel mid to distally.   Mild irregularities.  No focal  angiographic or obstructive stenosis  Diagonal branches: Single major diagonal branch.  Mild irregularities.  Left Circumflex:  Anatomically nondominant.  Moderate size vessel.   Proximal to mid segment.  Mild luminal irregularities.  Gives rise to 2  small proximal marginal branches.  Tortuous with mild irregularities.   Ongoing circumflex continues in the AV groove and gives off terminal  marginal branch.  Obtuse Marginal branches: First and second marginal branches are small,  tortuous with mild irregularities.  Third marginal branches small tortuous  with mild irregularities.  Right Coronary Artery:  Anatomically dominant.  Small vessel.  Moderately  tortuous throughout the proximal to mid segment.  Mild diffuse from  irregularities.  No focal angiographic or obstructive stenosis  Posterior Descending Artery: Small vessel.  Mild irregularities.    Left Ventriculography:  Normal left-ventricular chamber dimension  Normal left ventricular systolic function ejection fraction 55 to 60%    CONCLUSIONS:  Mild nonobstructive coronary artery disease  Normal left trickle function   Echocardiogram 08/05/2023 Left Ventricle  Left ventricle size is normal. Wall thickness is normal. Systolic function is normal. EF: 55-60%. Wall motion is normal. Doppler parameters consistent with mild diastolic dysfunction and low to normal LA pressure.   Right Ventricle  Right ventricle size is normal. Systolic function is normal.   Left Atrium  Left atrium size is normal.   Right Atrium  Right atrium size is normal.   IVC/SVC  The inferior vena  cava demonstrates a diameter of <=2.1 cm and collapses >50%; therefore, the right atrial pressure is estimated at 3 mmHg.   Mitral Valve  Mitral valve structure is normal. The leaflets are mildly thickened. There is no mitral regurgitation.   Tricuspid Valve  Tricuspid valve structure is normal. There is trace regurgitation. Unable to assess RVSP due to incomplete Doppler signal.   Aortic Valve  The aortic valve is tricuspid. The leaflets are not thickened and exhibit normal excursion. There is no regurgitation or stenosis.   Pulmonic Valve  The pulmonic valve was not well visualized. Trace regurgitation.   Ascending Aorta  The aortic root is normal in size. The ascending aorta is normal in size.   Pericardium  There is no pericardial effusion.   Study Details  A complete echo was performed using complete 2D, color flow Doppler and spectral Doppler. Overall the study quality was adequate. The imaging is on file and stored in a permanent location.   Laboratory Data: High Sensitivity Troponin:  No results for input(s): TROPONINIHS in the last 720 hours.   Chemistry Recent Labs  Lab 08/09/24 1101 08/10/24 0324  NA 141 137  K 3.3* 3.6  CL 98 99  CO2  --  28  GLUCOSE 130* 113*  BUN 13 12  CREATININE 1.10 0.93  CALCIUM   --  9.0  MG  --  2.1  GFRNONAA  --  >60  ANIONGAP  --  10    Recent Labs  Lab 08/10/24 0324  PROT 6.5  ALBUMIN 3.8  AST 18  ALT 16  ALKPHOS 88  BILITOT 0.5   Lipids No results for input(s): CHOL, TRIG, HDL, LABVLDL, LDLCALC, CHOLHDL in the last 168 hours.  Hematology Recent Labs  Lab 08/09/24 1055 08/09/24 1101 08/10/24 0324  WBC 8.8  --  7.1  RBC 4.92  --  4.23  HGB 14.5 15.0 12.3*  HCT 44.1 44.0 37.8*  MCV 89.6  --  89.4  MCH 29.5  --  29.1  MCHC 32.9  --  32.5  RDW 13.0  --  13.0  PLT 269  --  218   Thyroid   Recent Labs  Lab 08/09/24 1625  TSH 2.500    BNP Recent Labs  Lab 08/09/24 1055  PROBNP 1,350.0*     DDimer No results for input(s): DDIMER in the last 168 hours.  Radiology/Studies:  CT Angio Chest/Abd/Pel for Dissection W and/or Wo Contrast Result Date: 08/09/2024 EXAM: CTA CHEST, ABDOMEN AND PELVIS WITH AND WITHOUT CONTRAST 08/09/2024 12:12:52 PM TECHNIQUE: CTA of the chest was performed with and without the administration of 100 mL of iohexol  (OMNIPAQUE ) 350 MG/ML injection. CTA of the abdomen and pelvis was performed with the administration of 100 mL of iohexol  (OMNIPAQUE ) 350 MG/ML injection. Multiplanar reformatted images are provided for review. MIP images are provided for review. Automated exposure control, iterative reconstruction, and/or weight based adjustment of the mA/kV was utilized to reduce the radiation dose to as low as reasonably achievable. COMPARISON: 06/02/2024 and previous. CLINICAL HISTORY: Acute aortic syndrome (AAS) suspected. Claudication or leg ischemia, right lower extremity claudication. FINDINGS: VASCULATURE: AORTA: Atheromatous thoracic aorta with minimal calcified plaque. No abdominal aortic aneurysm. No dissection. PULMONARY ARTERIES: No pulmonary embolism with the limits of this exam. GREAT VESSELS OF AORTIC ARCH: No acute finding. No dissection. No arterial occlusion or significant stenosis. CELIAC TRUNK: No acute finding. No occlusion or significant stenosis. SUPERIOR MESENTERIC ARTERY: No acute finding. No occlusion or significant stenosis. INFERIOR MESENTERIC ARTERY: No acute finding. No occlusion or significant stenosis. RENAL ARTERIES: Two Right renal arteries, superior dominant, both patent. Aberrant trunk arising below the level of the IMA supplying both kidneys, widely patent proximally. Single left renal artery, patent. ILIAC ARTERIES: No acute finding. No occlusion or significant stenosis. CHEST: MEDIASTINUM: Subcentimeter prevascular and precarinal lymph nodes. Moderate scattered coronary calcifications. The heart and pericardium demonstrate no acute  abnormality. LUNGS AND PLEURA: The lungs are without acute process. No focal consolidation or pulmonary edema. No evidence of pleural effusion or pneumothorax. THORACIC BONES AND SOFT TISSUES: Vertebral endplate spurring at multiple levels in the lower thoracic spine. No acute soft tissue abnormality. ABDOMEN AND PELVIS: LIVER: The liver is unremarkable. GALLBLADDER AND BILE DUCTS: Gallbladder is unremarkable. No biliary ductal dilatation. SPLEEN: The spleen is unremarkable. PANCREAS: The pancreas is unremarkable. ADRENAL GLANDS: Bilateral adrenal glands demonstrate no acute abnormality. KIDNEYS, URETERS AND BLADDER: Horseshoe kidney. No stones in the kidneys or ureters. No hydronephrosis. No perinephric or periureteral stranding. Urinary bladder is unremarkable. GI AND BOWEL: Stomach and duodenal sweep demonstrate no acute abnormality. There is no bowel obstruction. No abnormal bowel wall thickening or distension. REPRODUCTIVE: Prostate enlargement with central coarse calcifications. PERITONEUM AND RETROPERITONEUM: No ascites or free air. LYMPH NODES: No lymphadenopathy. ABDOMINAL BONES AND SOFT TISSUES: Mild facet DJD L4 - S1 right worse than left. No acute soft tissue abnormality. IMPRESSION: 1. No evidence of acute aortic syndrome, dissection, or aneurysm. 2. No pulmonary embolism. 3. Horseshoe kidney. No stones or hydronephrosis. 4. Prostatomegaly. Electronically signed by: Katheleen Faes MD 08/09/2024 12:45 PM EDT RP Workstation: [id number]   CT Angio Aortobifemoral W and/or Wo Contrast Result Date: 08/09/2024 EXAM: CTA AORTOBIFEM WITH RUNOFF 08/09/2024 12:12:52 PM TECHNIQUE: Contrast-enhanced computed tomography angiography of the lower extremity was performed with multiplanar reconstructions. Maximum intensity projection images were created on a separate workstation and reviewed. Automated exposure control, iterative reconstruction, and/or weight based adjustment of the mA/kV was utilized to reduce the  radiation dose to as low as reasonably achievable. CONTRAST: 100mL iohexol  (OMNIPAQUE ) 350 MG/ML  injection. COMPARISON: None available. CLINICAL HISTORY: Claudication or leg ischemia; Right lower extremity claudication. Acute aortic syndrome (AAS) suspected; Claudication or leg ischemia, Right lower extremity claudication. FINDINGS: ARTERIAL: AORTA: Moderate calcified nonocclusive plaque in the visualized distal abdominal aorta without aneurysm. RIGHT LOWER EXTREMITY: RIGHT COMMON ILIAC ARTERY: Mildly atheromatous, patent. Mild origin stenosis right internal iliac artery, patent distally. RIGHT EXTERNAL ILIAC ARTERY: No significant stenosis or vessel occlusion. RIGHT COMMON FEMORAL ARTERY: Minimal plaque in the distal common femoral artery. RIGHT SUPERFICIAL FEMORAL ARTERY: Mild scattered calcified plaque without high-grade stenosis. RIGHT POPLITEAL ARTERY: Minimal plaque in the distal right popliteal artery without stenosis. RIGHT TIBIOPERONEAL TRUNK: atheromatous but apparently contiguous 3-vessel right tibial runoff. RIGHT ANTERIOR TIBIAL ARTERY: Flow is identified in the anterior tibial artery to the ankle. Flow identified in the dorsalis pedis artery. RIGHT PERONEAL ARTERY: Flow is identified to the ankle. RIGHT POSTERIOR TIBIAL ARTERY: No significant stenosis or vessel occlusion. Posterior tibial artery flow is present into the hindfoot. LEFT LOWER EXTREMITY: LEFT COMMON ILIAC ARTERY: Mild plaque. No plaque in the internal iliac. LEFT EXTERNAL ILIAC ARTERY: No significant stenosis or vessel occlusion. LEFT COMMON FEMORAL ARTERY: Mild plaque in the distal common femoral artery. LEFT SUPERFICIAL FEMORAL ARTERY: No significant stenosis or vessel occlusion. LEFT POPLITEAL ARTERY: Minimal plaque. LEFT TIBIOPERONEAL TRUNK: Minimally atheromatous but apparently contiguous 3-vessel tibial runoff, with some venous opacification limiting evaluation distally. LEFT ANTERIOR TIBIAL ARTERY: Flow is identified in the  anterior tibial artery to the ankle. Flow identified in the dorsalis pedis artery. LEFT PERONEAL ARTERY: Flow is identified to the ankle. LEFT POSTERIOR TIBIAL ARTERY: No significant stenosis or vessel occlusion. Posterior tibial artery flow is present into the hindfoot. BONES AND SOFT TISSUES: Horseshoe kidney, incompletely visualized. Coarse calcifications in the prostate, mildly enlarged. No significant osseous abnormalities seen within the field of view. IMPRESSION: 1. Moderate calcified nonocclusive plaque in the distal abdominal aorta without aneurysm. 2. No hemodynamically significant lower extremity arterial occlusive disease identified. Electronically signed by: Katheleen Faes MD 08/09/2024 12:30 PM EDT RP Workstation: [id number]   DG Chest Portable 1 View Result Date: 08/09/2024 EXAM: 1 VIEW XRAY OF THE CHEST 08/09/2024 10:58:38 AM COMPARISON: 07/03/2024 CLINICAL HISTORY: Afib Rvr. Pt arrived via Pov, c/o states he woke feeling weak and right leg has been cold and hot. Hr 140s in triage. Hx of afib, states needed cardioversion the last time he had this issue. FINDINGS: LUNGS AND PLEURA: No focal pulmonary opacity. No pulmonary edema. No pleural effusion. No pneumothorax. HEART AND MEDIASTINUM: Numerous leads and wires over chest are noted. Normal heart size and mediastinal contours . BONES AND SOFT TISSUES: Lordotic positioning noted. No acute osseous abnormality. IMPRESSION: 1. No acute cardiopulmonary process. Electronically signed by: Norleen Kil MD 08/09/2024 12:07 PM EDT RP Workstation: [id number]     Assessment and Plan:  Atrial fibrillation RVR -DCCV 2018, self-reported DCCV 2022 -03/2024 in sinus. Yesterday, patient had acute onset of tachycardia, palpitations, diaphoresis with heart rate 150 in atrial fibrillation.  Started on IV amiodarone  drip with overall reasonably controlled heart rates less than 110.   Continue with IV amiodarone  load, continue Eliquis  5 mg twice daily.  He has  not missed any doses. TSH normal this admission. Since he may be paroxysmal would continue to load with IV amiodarone  and see if he converts.   Heart rates remain in 100s    Would recomm DCCV tomorrow   He has been on amiodarone  since 2015, not ideal given young age.  Should have referral to  EP after  Will obtain echocardiogram first, but will add BB after if he needs more rate control.  Nonobstructive CAD Noted on cardiac catheterization 06/2023 at [facility].  No anginal complaints.  Negative troponin. Chronically has been on Plavix  37.5 mg.for carotid stents/ coil  Review with neuroradiology  Continue atorvastatin  40 mg  Chronic HFpEF -EF normal 07/2023 Exam difficult given size   He got 80 mg IV lasix  and now 40 mg IV x 1   Urin is very pale  Will give another lasix  later today      OSA Had been on CPAP until about 2 years ago    Machine recalled     Did not get replacemnt  Will need to be set up for Itamar sleep study      HTN  BP is OK  HL  Keep on statin   Risk Assessment/Risk Scores:   [place]  Heart Association (NYHA) Functional Class NYHA Class II  CHA2DS2-VASc Score = 5  This indicates a 7.2% annual risk of stroke. The patient's score is based upon: CHF History: 1 HTN History: 1 Diabetes History: 0 Stroke History: 2 Vascular Disease History: 1 Age Score: 0 Gender Score: 0   For questions or updates, please contact [facility] Please consult [email or web address] for contact info under      Signed, Thom LITTIE Sluder, PA-C  08/10/2024 8:42 AM   Pt seen and examined   I have amended note above by GORMAN Sluder Pt is a 63 yo with Hx of HTN, HL, mild CAD, OSA and PAF Presents with afib with RVR    Rates improved with IV amiodarone  but still above 100    On exam, pt in NAD Lungs are CTA Cardiac Distant HS    Irreg irreg  Abd   Obese   Supple  Ext   Tr LE edema    PAF   Keep on IV amiodarone  for now and ELiquis   Plan of DCCV tomorrow TTE today to reevaluate  chamber size/ function Follow up with EP after d/c for further discussion of possible ablation   Pt is very young to be on amiodarone    CAD  MIld  Nonobstructive   HTN  BP controlled   OSA  Need Itamar sleep study to reconfirm OSA

## 2024-08-10 NOTE — Progress Notes (Addendum)
 RT CONSULT NOTE : ADM: A-FIB WITH RVR HOME REGIMEN BREZTRI  BID, ALBUTEROL  INHALER Q6 PRN CURRENT THERAPY: BREZTRI  BID, ALBUTEROL  Q6 PRN X-RAY: 08/09/24 NO ACUTE CARDIOPULMONARY PROCESS ASSESSMENT: NO WOB, NO SOB, VITALS NORMAL, BS-DIM, NPC HX:  A-FIB, CHILDHOOD ASTHMA, COPD PT NEEDING NO EXTRA THERAPY AT THIS TIME BASED ON PT CONDITION AND X-RAY.

## 2024-08-10 NOTE — Progress Notes (Signed)
 Informed Consent   Shared Decision Making/Informed Consent The risks [stroke (1 in 1000), death (1 in 1000), kidney failure [usually temporary] (1 in 500), bleeding (1 in 200), allergic reaction [possibly serious] (1 in 200)], benefits (diagnostic support and management of coronary artery disease) and alternatives of a cardiac catheterization were discussed in detail with [name] and he is willing to proceed.

## 2024-08-10 NOTE — Plan of Care (Signed)
  Problem: Clinical Measurements: Goal: Ability to maintain clinical measurements within normal limits will improve Outcome: Progressing   Problem: Clinical Measurements: Goal: Will remain free from infection Outcome: Progressing   Problem: Activity: Goal: Risk for activity intolerance will decrease Outcome: Progressing   Problem: Nutrition: Goal: Adequate nutrition will be maintained Outcome: Progressing   

## 2024-08-10 NOTE — Consult Note (Addendum)
 Cardiology Consultation   Patient ID: [name] MRN: [id number]; DOB: 04/24/1961  Admit date: 08/09/2024 Date of Consult: 08/10/2024  PCP:  [name], MD   [facility] Providers Cardiologist:  None [facility]  Patient Profile: [name] is a 63 y.o. 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 Raenelle.  History of Present Illness: [name] is previous cardiac history with Dr. Talitha group but now has been followed  by [facility] cardiology.  He has history of atrial fibrillation with cardioversion in 2018.   Patient says he had an emergent cardioversion in interval though I cannot find it in record   Hs has been followed over the past few years at [facility] cardiology  Had some  complaints of chest pain.  Due to elevated CAC score he had cardiac catheterization 06/2023 that demonstrated mild nonobstructive disease.  For his atrial fibrillation he has been on amiodarone  since 2015 by his report and has been on Eliquis  and Plavix  37.5 mg.  He was just seen 03/2024 by their group and noted to be in sinus rhythm at that time.  He was referred to GI for his atypical complaints of chest pain.  Underwent esophageal dilitation in  July 2025 [facility])  Patient reports that he normally is not able to tell when he is in atrial fibrillation however he notes an episode yesterday while he was at work he became diaphoretic, started having tachycardia/palpitations and started to feel like he was going to pass out with no syncope.  He thinks that he had another episode about a week ago but this only lasted about an hour.  Otherwise he has done well since he was last seen.  Not reporting any chest pain now or shortness of breath or any exertional limitations.  He works at [employer] here in town.  Prior smoker but  stopped in 2015 when he had a stroke.  No drugs or alcohol.  Reports 100% compliancy with all of his medications and has not missed any doses of Eliquis .  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  No PE.  No signs of vascular congestion.  Normal lactic acid.  Troponins negative x 2.  TSH normal.  proBNP 1350.  No electrolyte derangements.  Normal creatinine and hemoglobin 12.3.  Past Medical History:  Diagnosis Date   Aneurysm    brain   Arthritis    Atrial fibrillation (HCC)    Bipolar disorder (HCC)    was on meds but was taken off 2 yrs ago and none since   Burning pain    in both legs-seeing Dr.Sethi for this   CAD (coronary artery disease)    Cataracts, bilateral    Childhood asthma    Last asthma attack at age 55; History of trach at 16 months (12/17/2016)   Chronic kidney disease 2009   Chronic lower back pain    COPD (chronic obstructive pulmonary disease) (HCC) 06/2012   uses Spiriva  and Albuterol  daily as needed   Depression    Family history of adverse reaction to anesthesia    Mom and sister have PONV   GERD (gastroesophageal reflux disease)    H/O hiatal hernia    Headache(784.0)    q 2-3 weeks; daily last 2 wks (  12/17/2016)   Hyperlipidemia    takes Ramipril  daily   Hypertension    takes Ramipril  daily   Joint pain    Joint swelling    Middle cerebral artery aneurysm    right   Nocturia    Numbness    both arms    Pneumonia    hx of-2014   PONV (postoperative nausea and vomiting)    Pt reports nausea only.   Sleep apnea    dx just this past week   08/21/2017   Squamous cell carcinoma of skin    Stroke (HCC) 2015   drag left foot more since; have to wear glasses now (12/17/2016)   Type II diabetes mellitus (HCC)    takes Metformin  daily   dx 2015   Umbilical hernia    Urinary frequency    Wears dentures    Wears glasses    and contact lenses    Past Surgical History:  Procedure Laterality Date   ANEURYSM COILING   2015   CARDIOVERSION N/A 12/18/2016   Procedure: CARDIOVERSION;  Surgeon: Salena Negri, MD;  [facility]: [facility];  Service: Cardiovascular;  Laterality: N/A;   COLONOSCOPY     COLONOSCOPY N/A 04/08/2018   Procedure: COLONOSCOPY;  Surgeon: Rollin Dover, MD;  Location: [facility];  Service: Endoscopy;  Laterality: N/A;   COLONOSCOPY N/A 10/18/2020   Procedure: COLONOSCOPY;  Surgeon: Rollin Dover, MD;  [facility];  Service: Endoscopy;  Laterality: N/A;   ESOPHAGOGASTRODUODENOSCOPY N/A 04/08/2018   Procedure: ESOPHAGOGASTRODUODENOSCOPY (EGD);  Surgeon: Rollin Dover, MD;  Location: [facility];  Service: Endoscopy;  Laterality: N/A;   ESOPHAGOGASTRODUODENOSCOPY N/A 04/21/2024   Procedure: EGD (ESOPHAGOGASTRODUODENOSCOPY);  Surgeon: Rollin Dover, MD;  Location: [facility];  Service: Gastroenterology;  Laterality: N/A;   EXCISIONAL HEMORRHOIDECTOMY  1980s   soon after rectal OR   GANGLION CYST EXCISION Right    HEMOSTASIS CLIP PLACEMENT  10/18/2020   Procedure: HEMOSTASIS CLIP PLACEMENT;  Surgeon: Rollin Dover, MD;  [facility];  Service: Endoscopy;;   HOT HEMOSTASIS N/A 04/08/2018   Procedure: HOT HEMOSTASIS (ARGON PLASMA COAGULATION/BICAP);  Surgeon: Rollin Dover, MD;  Location: [facility];  Service: Endoscopy;  Laterality: N/A;   HOT HEMOSTASIS N/A 10/18/2020   Procedure: HOT HEMOSTASIS (ARGON PLASMA COAGULATION/BICAP);  Surgeon: Rollin Dover, MD;  Location: [facility];  Service: Endoscopy;  Laterality: N/A;   IR 3D INDEPENDENT WKST  05/10/2017   IR 3D INDEPENDENT WKST  06/28/2017   IR ANGIO INTRA EXTRACRAN SEL COM CAROTID INNOMINATE BILAT MOD SED  04/22/2017   IR ANGIO INTRA EXTRACRAN SEL COM CAROTID INNOMINATE BILAT MOD SED  11/22/2017   IR ANGIO INTRA EXTRACRAN SEL COM CAROTID INNOMINATE BILAT MOD SED  05/13/2018   IR ANGIO INTRA EXTRACRAN SEL INTERNAL CAROTID UNI L MOD SED  12/27/2017   IR ANGIO INTRA EXTRACRAN SEL INTERNAL CAROTID UNI R MOD SED  05/10/2017   IR  ANGIO INTRA EXTRACRAN SEL INTERNAL CAROTID UNI R MOD SED  06/28/2017   IR ANGIO VERTEBRAL SEL SUBCLAVIAN INNOMINATE UNI L MOD SED  04/22/2017   IR ANGIO VERTEBRAL SEL SUBCLAVIAN INNOMINATE UNI R MOD SED  06/28/2017   IR ANGIO VERTEBRAL SEL SUBCLAVIAN INNOMINATE UNI R MOD SED  11/22/2017   IR ANGIO VERTEBRAL SEL SUBCLAVIAN INNOMINATE UNI R MOD SED  05/13/2018   IR ANGIO VERTEBRAL SEL VERTEBRAL UNI R MOD SED  04/22/2017   IR ANGIOGRAM FOLLOW UP STUDY  05/10/2017   IR ANGIOGRAM FOLLOW UP STUDY  06/28/2017   IR  ANGIOGRAM FOLLOW UP STUDY  12/27/2017   IR NEURO EACH ADD'L AFTER BASIC UNI LEFT (MS)  12/27/2017   IR NEURO EACH ADD'L AFTER BASIC UNI RIGHT (MS)  05/10/2017   IR NEURO EACH ADD'L AFTER BASIC UNI RIGHT (MS)  06/28/2017   IR RADIOLOGIST EVAL & MGMT  04/30/2017   IR RADIOLOGIST EVAL & MGMT  06/15/2017   IR RADIOLOGIST EVAL & MGMT  07/13/2017   IR RADIOLOGIST EVAL & MGMT  01/11/2018   IR TRANSCATH/EMBOLIZ  05/10/2017   IR TRANSCATH/EMBOLIZ  06/28/2017   IR TRANSCATH/EMBOLIZ  12/27/2017   MULTIPLE TOOTH EXTRACTIONS     POLYPECTOMY  04/08/2018   Procedure: POLYPECTOMY;  Surgeon: Rollin Dover, MD;  [facility];  Service: Endoscopy;;   RADIOLOGY WITH ANESTHESIA N/A 08/08/2014   Procedure: RADIOLOGY WITH ANESTHESIA EMBOLIZATION;  Surgeon: Medication Radiologist, MD;  [facility];  Service: Radiology;  Laterality: N/A;   RADIOLOGY WITH ANESTHESIA N/A 11/21/2014   Procedure: RADIOLOGY WITH ANESTHESIA;  Surgeon: Thyra MARLA Nash, MD;  [facility];  Service: Radiology;  Laterality: N/A;   RADIOLOGY WITH ANESTHESIA N/A 02/27/2015   Procedure: RADIOLOGY WITH ANESTHESIA;  Surgeon: Thyra Nash, MD;  [facility];  Service: Radiology;  Laterality: N/A;   RADIOLOGY WITH ANESTHESIA N/A 09/25/2015   Procedure: RADIOLOGY WITH ANESTHESIA;  Surgeon: Thyra Nash, MD;  [facility];  Service: Radiology;  Laterality: N/A;   RADIOLOGY WITH ANESTHESIA N/A 05/10/2017   Procedure: EMBOLIZATION;   Surgeon: Nash Thyra, MD;  [facility];  Service: Radiology;  Laterality: N/A;   RADIOLOGY WITH ANESTHESIA N/A 06/28/2017   Procedure: RADIOLOGY WITH ANESTHESIA-EMBOLIZATION;  Surgeon: Nash Thyra, MD;  [facility];  Service: Radiology;  Laterality: N/A;   RADIOLOGY WITH ANESTHESIA N/A 08/26/2017   Procedure: RADIOLOGY WITH ANESTHESIA EMBOLIZATION;  Surgeon: Nash Thyra, MD;  [facility];  Service: Radiology;  Laterality: N/A;   RADIOLOGY WITH ANESTHESIA N/A 11/22/2017   Procedure: EMBOLIZATION;  Surgeon: Nash Thyra, MD;  [facility];  Service: Radiology;  Laterality: N/A;   RADIOLOGY WITH ANESTHESIA N/A 12/27/2017   Procedure: EMBOLIZATION;  Surgeon: Nash Thyra, MD;  [facility];  Service: Radiology;  Laterality: N/A;   RECTAL SURGERY  1980s   tore it lifting heavy furniture; sewed it back together   SHOULDER ARTHROSCOPY WITH OPEN ROTATOR CUFF REPAIR Right 2016   SHOULDER ARTHROSCOPY WITH ROTATOR CUFF REPAIR Left 1996   SHOULDER OPEN ROTATOR CUFF REPAIR Left 1997   took out 8inches of my collarbone   TEE WITHOUT CARDIOVERSION N/A 12/18/2016   Procedure: TRANSESOPHAGEAL ECHOCARDIOGRAM (TEE);  Surgeon: Salena Negri, MD;  Location: [facility]COPY;  Service: Cardiovascular;  Laterality: N/A;   TRACHEOSTOMY  1964   at age 53 months old; for asthma   TRACHEOSTOMY CLOSURE     VENTRAL HERNIA REPAIR N/A 09/24/2020   Procedure: LAPAROSCOPIC VENTRAL WALL HERNIA REPAIR WITH MESH;  Surgeon: Sheldon Standing, MD;  [facility];  Service: General;  Laterality: N/A;    Scheduled Meds:  apixaban   5 mg Oral BID   atorvastatin   40 mg Oral Daily   budesonide -glycopyrrolate -formoterol  2 puff Inhalation BID   Chlorhexidine  Gluconate Cloth  6 each Topical Daily   clopidogrel   37.5 mg Oral Daily   furosemide   40 mg Intravenous Q12H   pantoprazole   40 mg Oral Daily   potassium chloride   40 mEq Oral BID   Continuous Infusions:  sodium chloride       amiodarone  30 mg/hr (08/10/24 0800)  PRN Meds: sodium chloride , albuterol , nitroGLYCERIN , mouth rinse  Allergies:    Allergies  Allergen Reactions   Tylox [Oxycodone -Acetaminophen ] Hives, Rash and Other (See Comments)    Sweating, shaking, tremors and diaphoresis also   Gabapentin  Other (See Comments)    Gained weight and retained fluid   Tape Dermatitis, Rash and Other (See Comments)    States IV tape is fine    Social History:   Social History   Socioeconomic History   Marital status: Divorced    Spouse name: not together since 2007   Number of children: 3   Years of education: 10   Highest education level: Not on file  Occupational History   Occupation: Corporate Treasurer: [employer]    Comment: airport   Tobacco Use   Smoking status: Former    Current packs/day: 0.00    Average packs/day: 1.5 packs/day for 40.0 years (60.0 ttl pk-yrs)    Types: Cigarettes    Start date: 01/15/1975    Quit date: 06/02/2014    Years since quitting: 10.1    Passive exposure: Never   Smokeless tobacco: Never   Tobacco comments:    Smoked for about 40 years, smoked 1.5ppd  Vaping Use   Vaping status: Former  Substance and Sexual Activity   Alcohol use: Yes    Alcohol/week: 3.0 standard drinks of alcohol    Types: 3 Cans of beer per week    Comment: drinks 3 - 12oz bottles every monday, during shooting pool   Drug use: No   Sexual activity: Never    Partners: Female    Comment: partner has had surgery to prevent pregnancy  Other Topics Concern   Not on file  Social History Narrative   Lives with his son and his mother.  His son has no contact with the son's mother, though the patient is not legally separated from her.  She has a history of drug use and has been in prison several times.   Social Drivers of Corporate Investment Banker Strain: Low Risk  (07/30/2024)   Received from [facility]   Overall Financial Resource Strain (CARDIA)    How hard is it for you  to pay for the very basics like food, housing, medical care, and heating?: Not hard at all  Food Insecurity: No Food Insecurity (08/09/2024)   Hunger Vital Sign    Worried About Running Out of Food in the Last Year: Never true    Ran Out of Food in the Last Year: Never true  Recent Concern: Food Insecurity - Food Insecurity Present (07/30/2024)   Received from [facility]   Hunger Vital Sign    Within the past 12 months, you worried that your food would run out before you got the money to buy more.: Sometimes true    Within the past 12 months, the food you bought just didn't last and you didn't have money to get more.: Never true  Transportation Needs: No Transportation Needs (08/09/2024)   PRAPARE - Administrator, Civil Service (Medical): No    Lack of Transportation (Non-Medical): No  Physical Activity: Inactive (07/30/2024)   Received from [facility]   Exercise Vital Sign    On average, how many days per week do you engage in moderate to strenuous exercise (like a brisk walk)?: 0 days    Minutes of Exercise per Session: Not on file  Stress: Stress Concern Present (07/30/2024)   Received from [facility]   [employer] of Occupational Health - Occupational Stress Questionnaire    Do you feel stress - tense, restless, nervous, or anxious, or unable to sleep at night because your mind is troubled all the time - these days?: Rather much  Social Connections: Socially Integrated (07/30/2024)   Received from [facility]   Social Network    How would you rate your social network (family, work, friends)?: Good participation with social networks  Intimate Partner Violence: Not At Risk (08/09/2024)   Humiliation, Afraid, Rape, and Kick questionnaire    Fear of Current or Ex-Partner: No    Emotionally Abused: No    Physically Abused: No    Sexually Abused: No    Family History:   Family History  Problem Relation Age of Onset   Heart disease Mother        s/p 3V  CABG   Cancer Father 40       lung cancer; +tobacco   Stroke Father    Cancer Maternal Grandmother    Heart disease Maternal Grandmother    Cancer Paternal Grandmother    Lupus Sister    Multiple sclerosis Sister    Anemia Daughter      ROS:  Please see the history of present illness. All other ROS reviewed and negative.     Physical Exam/Data: Vitals:   08/10/24 0600 08/10/24 0619 08/10/24 0745 08/10/24 0800  BP: 111/74   (!) 137/97  Pulse: (!) 101 (!) 109  (!) 114  Resp:  20  15  Temp:   97.6 F (36.4 C)   TempSrc:   Oral   SpO2: 92% (!) 72%  95%  Weight:      Height:        Intake/Output Summary (Last 24 hours) at 08/10/2024 0842 Last data filed at 08/10/2024 0800 Gross per 24 hour  Intake 964.53 ml  Output 300 ml  Net 664.53 ml      08/09/2024    4:17 PM 07/03/2024    9:44 AM 04/21/2024    8:56 AM  Last 3 Weights  Weight (lbs) 246 lb 7.6 oz 262 lb 242 lb 8.1 oz  Weight (kg) 111.8 kg 118.842 kg 110 kg     Body mass index is 37.48 kg/m.  General:  Obese 63 yo in no acute distress HEENT: normal Neck: no JVD Vascular: No carotid bruits; Distal pulses 2+ bilaterally Cardiac: IRRR; distant heart sounds Lungs:  clear to auscultation bilaterally, no wheezing, rhonchi or rales  Abd: soft, nontender, no hepatomegaly  Ext: no edema  Chronic skin changes   Varicosities  Musculoskeletal:  No deformities, BUE and BLE strength normal and equal Skin: warm and dry  Neuro:  CNs 2-12 intact, no focal abnormalities noted Psych:  Normal affect   EKG:  The EKG was personally reviewed and demonstrates: Atrial fibrillation heart rate 107.  Right bundle branch block. Telemetry:  Telemetry was personally reviewed and demonstrates: Atrial fibrillation heart rate around 105  Relevant CV Studies: Cardiac catheterization 07-10-23 1. Central Aortic Pressure -128/66 mmHg. Mean 91 mmHg  2. Left Ventricular Pressure-130/7 mmHg  Additional comments on on hemodynamics:  LVEDP 19  mmHg.   Coronary Angiography:   Antatomically right dominant  Mild to moderate fluoroscopic coronary calcification   Left Main: Angiographically normal.  Bifurcates distally.  Left anterior descending artery: Mild diffuse luminal irregularities.   Tortuous in the mid to distal segment.  Small vessel mid to distally.   Mild irregularities.  No focal  angiographic or obstructive stenosis  Diagonal branches: Single major diagonal branch.  Mild irregularities.  Left Circumflex:  Anatomically nondominant.  Moderate size vessel.   Proximal to mid segment.  Mild luminal irregularities.  Gives rise to 2  small proximal marginal branches.  Tortuous with mild irregularities.   Ongoing circumflex continues in the AV groove and gives off terminal  marginal branch.  Obtuse Marginal branches: First and second marginal branches are small,  tortuous with mild irregularities.  Third marginal branches small tortuous  with mild irregularities.  Right Coronary Artery:  Anatomically dominant.  Small vessel.  Moderately  tortuous throughout the proximal to mid segment.  Mild diffuse from  irregularities.  No focal angiographic or obstructive stenosis  Posterior Descending Artery: Small vessel.  Mild irregularities.    Left Ventriculography:  Normal left-ventricular chamber dimension  Normal left ventricular systolic function ejection fraction 55 to 60%    CONCLUSIONS:  Mild nonobstructive coronary artery disease  Normal left trickle function   Echocardiogram 08/05/2023 Left Ventricle  Left ventricle size is normal. Wall thickness is normal. Systolic function is normal. EF: 55-60%. Wall motion is normal. Doppler parameters consistent with mild diastolic dysfunction and low to normal LA pressure.   Right Ventricle  Right ventricle size is normal. Systolic function is normal.   Left Atrium  Left atrium size is normal.   Right Atrium  Right atrium size is normal.   IVC/SVC  The inferior vena  cava demonstrates a diameter of <=2.1 cm and collapses >50%; therefore, the right atrial pressure is estimated at 3 mmHg.   Mitral Valve  Mitral valve structure is normal. The leaflets are mildly thickened. There is no mitral regurgitation.   Tricuspid Valve  Tricuspid valve structure is normal. There is trace regurgitation. Unable to assess RVSP due to incomplete Doppler signal.   Aortic Valve  The aortic valve is tricuspid. The leaflets are not thickened and exhibit normal excursion. There is no regurgitation or stenosis.   Pulmonic Valve  The pulmonic valve was not well visualized. Trace regurgitation.   Ascending Aorta  The aortic root is normal in size. The ascending aorta is normal in size.   Pericardium  There is no pericardial effusion.   Study Details  A complete echo was performed using complete 2D, color flow Doppler and spectral Doppler. Overall the study quality was adequate. The imaging is on file and stored in a permanent location.   Laboratory Data: High Sensitivity Troponin:  No results for input(s): TROPONINIHS in the last 720 hours.   Chemistry Recent Labs  Lab 08/09/24 1101 08/10/24 0324  NA 141 137  K 3.3* 3.6  CL 98 99  CO2  --  28  GLUCOSE 130* 113*  BUN 13 12  CREATININE 1.10 0.93  CALCIUM   --  9.0  MG  --  2.1  GFRNONAA  --  >60  ANIONGAP  --  10    Recent Labs  Lab 08/10/24 0324  PROT 6.5  ALBUMIN 3.8  AST 18  ALT 16  ALKPHOS 88  BILITOT 0.5   Lipids No results for input(s): CHOL, TRIG, HDL, LABVLDL, LDLCALC, CHOLHDL in the last 168 hours.  Hematology Recent Labs  Lab 08/09/24 1055 08/09/24 1101 08/10/24 0324  WBC 8.8  --  7.1  RBC 4.92  --  4.23  HGB 14.5 15.0 12.3*  HCT 44.1 44.0 37.8*  MCV 89.6  --  89.4  MCH 29.5  --  29.1  MCHC 32.9  --  32.5  RDW 13.0  --  13.0  PLT 269  --  218   Thyroid   Recent Labs  Lab 08/09/24 1625  TSH 2.500    BNP Recent Labs  Lab 08/09/24 1055  PROBNP 1,350.0*     DDimer No results for input(s): DDIMER in the last 168 hours.  Radiology/Studies:  CT Angio Chest/Abd/Pel for Dissection W and/or Wo Contrast Result Date: 08/09/2024 EXAM: CTA CHEST, ABDOMEN AND PELVIS WITH AND WITHOUT CONTRAST 08/09/2024 12:12:52 PM TECHNIQUE: CTA of the chest was performed with and without the administration of 100 mL of iohexol  (OMNIPAQUE ) 350 MG/ML injection. CTA of the abdomen and pelvis was performed with the administration of 100 mL of iohexol  (OMNIPAQUE ) 350 MG/ML injection. Multiplanar reformatted images are provided for review. MIP images are provided for review. Automated exposure control, iterative reconstruction, and/or weight based adjustment of the mA/kV was utilized to reduce the radiation dose to as low as reasonably achievable. COMPARISON: 06/02/2024 and previous. CLINICAL HISTORY: Acute aortic syndrome (AAS) suspected. Claudication or leg ischemia, right lower extremity claudication. FINDINGS: VASCULATURE: AORTA: Atheromatous thoracic aorta with minimal calcified plaque. No abdominal aortic aneurysm. No dissection. PULMONARY ARTERIES: No pulmonary embolism with the limits of this exam. GREAT VESSELS OF AORTIC ARCH: No acute finding. No dissection. No arterial occlusion or significant stenosis. CELIAC TRUNK: No acute finding. No occlusion or significant stenosis. SUPERIOR MESENTERIC ARTERY: No acute finding. No occlusion or significant stenosis. INFERIOR MESENTERIC ARTERY: No acute finding. No occlusion or significant stenosis. RENAL ARTERIES: Two Right renal arteries, superior dominant, both patent. Aberrant trunk arising below the level of the IMA supplying both kidneys, widely patent proximally. Single left renal artery, patent. ILIAC ARTERIES: No acute finding. No occlusion or significant stenosis. CHEST: MEDIASTINUM: Subcentimeter prevascular and precarinal lymph nodes. Moderate scattered coronary calcifications. The heart and pericardium demonstrate no acute  abnormality. LUNGS AND PLEURA: The lungs are without acute process. No focal consolidation or pulmonary edema. No evidence of pleural effusion or pneumothorax. THORACIC BONES AND SOFT TISSUES: Vertebral endplate spurring at multiple levels in the lower thoracic spine. No acute soft tissue abnormality. ABDOMEN AND PELVIS: LIVER: The liver is unremarkable. GALLBLADDER AND BILE DUCTS: Gallbladder is unremarkable. No biliary ductal dilatation. SPLEEN: The spleen is unremarkable. PANCREAS: The pancreas is unremarkable. ADRENAL GLANDS: Bilateral adrenal glands demonstrate no acute abnormality. KIDNEYS, URETERS AND BLADDER: Horseshoe kidney. No stones in the kidneys or ureters. No hydronephrosis. No perinephric or periureteral stranding. Urinary bladder is unremarkable. GI AND BOWEL: Stomach and duodenal sweep demonstrate no acute abnormality. There is no bowel obstruction. No abnormal bowel wall thickening or distension. REPRODUCTIVE: Prostate enlargement with central coarse calcifications. PERITONEUM AND RETROPERITONEUM: No ascites or free air. LYMPH NODES: No lymphadenopathy. ABDOMINAL BONES AND SOFT TISSUES: Mild facet DJD L4 - S1 right worse than left. No acute soft tissue abnormality. IMPRESSION: 1. No evidence of acute aortic syndrome, dissection, or aneurysm. 2. No pulmonary embolism. 3. Horseshoe kidney. No stones or hydronephrosis. 4. Prostatomegaly. Electronically signed by: Katheleen Faes MD 08/09/2024 12:45 PM EDT RP Workstation: [id number]   CT Angio Aortobifemoral W and/or Wo Contrast Result Date: 08/09/2024 EXAM: CTA AORTOBIFEM WITH RUNOFF 08/09/2024 12:12:52 PM TECHNIQUE: Contrast-enhanced computed tomography angiography of the lower extremity was performed with multiplanar reconstructions. Maximum intensity projection images were created on a separate workstation and reviewed. Automated exposure control, iterative reconstruction, and/or weight based adjustment of the mA/kV was utilized to reduce the  radiation dose to as low as reasonably achievable. CONTRAST: 100mL iohexol  (OMNIPAQUE ) 350 MG/ML  injection. COMPARISON: None available. CLINICAL HISTORY: Claudication or leg ischemia; Right lower extremity claudication. Acute aortic syndrome (AAS) suspected; Claudication or leg ischemia, Right lower extremity claudication. FINDINGS: ARTERIAL: AORTA: Moderate calcified nonocclusive plaque in the visualized distal abdominal aorta without aneurysm. RIGHT LOWER EXTREMITY: RIGHT COMMON ILIAC ARTERY: Mildly atheromatous, patent. Mild origin stenosis right internal iliac artery, patent distally. RIGHT EXTERNAL ILIAC ARTERY: No significant stenosis or vessel occlusion. RIGHT COMMON FEMORAL ARTERY: Minimal plaque in the distal common femoral artery. RIGHT SUPERFICIAL FEMORAL ARTERY: Mild scattered calcified plaque without high-grade stenosis. RIGHT POPLITEAL ARTERY: Minimal plaque in the distal right popliteal artery without stenosis. RIGHT TIBIOPERONEAL TRUNK: atheromatous but apparently contiguous 3-vessel right tibial runoff. RIGHT ANTERIOR TIBIAL ARTERY: Flow is identified in the anterior tibial artery to the ankle. Flow identified in the dorsalis pedis artery. RIGHT PERONEAL ARTERY: Flow is identified to the ankle. RIGHT POSTERIOR TIBIAL ARTERY: No significant stenosis or vessel occlusion. Posterior tibial artery flow is present into the hindfoot. LEFT LOWER EXTREMITY: LEFT COMMON ILIAC ARTERY: Mild plaque. No plaque in the internal iliac. LEFT EXTERNAL ILIAC ARTERY: No significant stenosis or vessel occlusion. LEFT COMMON FEMORAL ARTERY: Mild plaque in the distal common femoral artery. LEFT SUPERFICIAL FEMORAL ARTERY: No significant stenosis or vessel occlusion. LEFT POPLITEAL ARTERY: Minimal plaque. LEFT TIBIOPERONEAL TRUNK: Minimally atheromatous but apparently contiguous 3-vessel tibial runoff, with some venous opacification limiting evaluation distally. LEFT ANTERIOR TIBIAL ARTERY: Flow is identified in the  anterior tibial artery to the ankle. Flow identified in the dorsalis pedis artery. LEFT PERONEAL ARTERY: Flow is identified to the ankle. LEFT POSTERIOR TIBIAL ARTERY: No significant stenosis or vessel occlusion. Posterior tibial artery flow is present into the hindfoot. BONES AND SOFT TISSUES: Horseshoe kidney, incompletely visualized. Coarse calcifications in the prostate, mildly enlarged. No significant osseous abnormalities seen within the field of view. IMPRESSION: 1. Moderate calcified nonocclusive plaque in the distal abdominal aorta without aneurysm. 2. No hemodynamically significant lower extremity arterial occlusive disease identified. Electronically signed by: Katheleen Faes MD 08/09/2024 12:30 PM EDT RP Workstation: [id number]   DG Chest Portable 1 View Result Date: 08/09/2024 EXAM: 1 VIEW XRAY OF THE CHEST 08/09/2024 10:58:38 AM COMPARISON: 07/03/2024 CLINICAL HISTORY: Afib Rvr. Pt arrived via Pov, c/o states he woke feeling weak and right leg has been cold and hot. Hr 140s in triage. Hx of afib, states needed cardioversion the last time he had this issue. FINDINGS: LUNGS AND PLEURA: No focal pulmonary opacity. No pulmonary edema. No pleural effusion. No pneumothorax. HEART AND MEDIASTINUM: Numerous leads and wires over chest are noted. Normal heart size and mediastinal contours . BONES AND SOFT TISSUES: Lordotic positioning noted. No acute osseous abnormality. IMPRESSION: 1. No acute cardiopulmonary process. Electronically signed by: Norleen Kil MD 08/09/2024 12:07 PM EDT RP Workstation: [id number]     Assessment and Plan:  Atrial fibrillation RVR -DCCV 2018, self-reported DCCV 2022 -03/2024 in sinus. Yesterday, patient had acute onset of tachycardia, palpitations, diaphoresis with heart rate 150 in atrial fibrillation.  Started on IV amiodarone  drip with overall reasonably controlled heart rates less than 110.   Continue with IV amiodarone  load, continue Eliquis  5 mg twice daily.  He has  not missed any doses. TSH normal this admission. Since he may be paroxysmal would continue to load with IV amiodarone  and see if he converts.   Heart rates remain in 100s    Would recomm DCCV tomorrow   He has been on amiodarone  since 2015, not ideal given young age.  Should have referral to  EP after  Will obtain echocardiogram first, but will add BB after if he needs more rate control.  Nonobstructive CAD Noted on cardiac catheterization 06/2023 at [facility].  No anginal complaints.  Negative troponin. Chronically has been on Plavix  37.5 mg.for carotid stents/ coil  Review with neuroradiology  Continue atorvastatin  40 mg  Chronic HFpEF -EF normal 07/2023 Exam difficult given size   He got 80 mg IV lasix  and now 40 mg IV x 1   Urin is very pale  Will give another lasix  later today      OSA Had been on CPAP until about 2 years ago    Machine recalled     Did not get replacemnt  Will need to be set up for Itamar sleep study      HTN  BP is OK  HL  Keep on statin   Risk Assessment/Risk Scores:   New [place]  Heart Association (NYHA) Functional Class NYHA Class II  CHA2DS2-VASc Score = 5  This indicates a 7.2% annual risk of stroke. The patient's score is based upon: CHF History: 1 HTN History: 1 Diabetes History: 0 Stroke History: 2 Vascular Disease History: 1 Age Score: 0 Gender Score: 0   For questions or updates, please contact [facility] Please consult [email or web address] for contact info under      Signed, Thom LITTIE Sluder, PA-C  08/10/2024 8:42 AM   Pt seen and examined   I have amended note above by GORMAN Sluder Pt is a 63 yo with Hx of HTN, HL, mild CAD, OSA and PAF Presents with afib with RVR    Rates improved with IV amiodarone  but still above 100    On exam, pt in NAD Lungs are CTA Cardiac Distant HS    Irreg irreg  Abd   Obese   Supple  Ext   Tr LE edema    PAF   Keep on IV amiodarone  for now and ELiquis   Plan of DCCV tomorrow TTE today to reevaluate  chamber size/ function Follow up with EP after d/c for further discussion of possible ablation   Pt is very young to be on amiodarone    CAD  MIld  Nonobstructive   HTN  BP controlled   OSA  Need Itamar sleep study to reconfirm OSA

## 2024-08-10 NOTE — Plan of Care (Signed)
   Problem: Health Behavior/Discharge Planning: Goal: Ability to manage health-related needs will improve Outcome: Progressing   Problem: Clinical Measurements: Goal: Ability to maintain clinical measurements within normal limits will improve Outcome: Progressing Goal: Will remain free from infection Outcome: Progressing Goal: Diagnostic test results will improve Outcome: Progressing Goal: Cardiovascular complication will be avoided Outcome: Progressing

## 2024-08-10 NOTE — Progress Notes (Signed)
 PROGRESS NOTE    [name]  FMW:[id number] DOB: Sep 23, 1961 DOA: 08/09/2024 PCP: [name], MD    Brief Narrative:  63 year old with obesity, additional A-fib on amiodarone  and Eliquis , nonobstructive coronary artery disease, history of TIA, carotid artery disease, chronic venous insufficiency and leg swelling which is mostly chronic, chronic dyspnea presented with dizziness lightheadedness and feeling hot and cold.  In the emergency room he was found to be in rapid A-fib with heart rate 120-150 controlled with amiodarone .  Symptoms improved.  Admitted with amiodarone  infusion.  Subjective: Patient seen and examined.  He had occasional chest discomfort but improved.  More than that, patient had multiple episodes of apnea and hypoxemia improved with up to 4 L of nasal cannula oxygen. Remains A-fib and heart rate is not well-controlled.  On Amio infusion.  Denies any dizziness or lightheadedness. Patient does have significant sleep apnea, his CPAP was recalled and he has appointment for sleep study only on December 1.  Patient has significant nocturnal hypoxemia and apnea, will ask RT to do nocturnal oxygen studies and see if he can qualify for CPAP at home if not we will send home on nocturnal oxygen.  Discussed with cardiology at the bedside.  Assessment & Plan:   Paroxysmal A-fib with RVR: Persistent A-fib. Symptoms controlled.  Suboptimally controlled heart rate.  Continue Amio infusion.  Continue Eliquis .  Previous echocardiogram with normal EF. Electrolytes are adequate.  TSH is normal. Discussed with cardiology, may need further rate control medications and or cardioversion.  Sleep apnea: Untreated.  Monitor on oxygen, will need outpatient sleep study.  Possibly home on nighttime oxygen.  Chest pain: Likely atypical.  Negative troponins.  On half dose Plavix .  Previous cardiac cath with mild obstructive coronary artery disease.  CT angiogram negative for PE or  dissections.  Chronic diastolic heart failure: proBNP is elevated.  Leg swelling is chronic.  Describes worsening nocturnal shortness of breath and apnea, will try IV Lasix  today.  GERD: On PPI.  COPD: Without exacerbation.  Home medications.  Chronic venous stasis: Could not tolerate compression socks.  Elevate legs.    DVT prophylaxis: Place and maintain sequential compression device Start: 08/09/24 2051 apixaban  (ELIQUIS ) tablet 5 mg   Code Status: Full code Family Communication: None at the bedside Disposition Plan: Status is: Observation The patient will require care spanning > 2 midnights and should be moved to inpatient because: Persistent A-fib, on Amio infusion     Consultants:  Cardiology  Procedures:  None  Antimicrobials:  None     Objective: Vitals:   08/10/24 0500 08/10/24 0600 08/10/24 0619 08/10/24 0800  BP: 129/75 111/74  (!) 137/97  Pulse: (!) 103 (!) 101 (!) 109 (!) 114  Resp:   20 15  Temp:      TempSrc:      SpO2: 94% 92% (!) 72% 95%  Weight:      Height:        Intake/Output Summary (Last 24 hours) at 08/10/2024 0817 Last data filed at 08/10/2024 0800 Gross per 24 hour  Intake 964.53 ml  Output 300 ml  Net 664.53 ml   Filed Weights   08/09/24 1617  Weight: 111.8 kg    Examination:  General exam: Appears calm and comfortable.  Pleasant interactive.  On room air now. Respiratory system: Clear to auscultation. Respiratory effort normal. Cardiovascular system: S1 & S2 heard, irregularly irregular.  Tachycardic.  No JVD, murmurs, rubs, gallops or clicks.  Chronic pedal edema.  Nonpitting.  Pigmentation and venous stasis changes. Gastrointestinal system: Abdomen is nondistended, soft and nontender. No organomegaly or masses felt. Normal bowel sounds heard. Central nervous system: Alert and oriented. No focal neurological deficits. Extremities: Symmetric 5 x 5 power. Skin: No rashes, lesions or ulcers Psychiatry: Judgement and insight  appear normal. Mood & affect appropriate.     Data Reviewed: I have personally reviewed following labs and imaging studies  CBC: Recent Labs  Lab 08/09/24 1055 08/09/24 1101 08/10/24 0324  WBC 8.8  --  7.1  NEUTROABS 5.6  --   --   HGB 14.5 15.0 12.3*  HCT 44.1 44.0 37.8*  MCV 89.6  --  89.4  PLT 269  --  218   Basic Metabolic Panel: Recent Labs  Lab 08/09/24 1101 08/10/24 0324  NA 141 137  K 3.3* 3.6  CL 98 99  CO2  --  28  GLUCOSE 130* 113*  BUN 13 12  CREATININE 1.10 0.93  CALCIUM   --  9.0  MG  --  2.1  PHOS  --  3.4   GFR: Estimated Creatinine Clearance: 99.9 mL/min (by C-G formula based on SCr of 0.93 mg/dL). Liver Function Tests: Recent Labs  Lab 08/10/24 0324  AST 18  ALT 16  ALKPHOS 88  BILITOT 0.5  PROT 6.5  ALBUMIN 3.8   No results for input(s): LIPASE, AMYLASE in the last 168 hours. No results for input(s): AMMONIA in the last 168 hours. Coagulation Profile: Recent Labs  Lab 08/09/24 1055  INR 1.1   Cardiac Enzymes: No results for input(s): CKTOTAL, CKMB, CKMBINDEX, TROPONINI in the last 168 hours. BNP (last 3 results) Recent Labs    08/09/24 1055  PROBNP 1,350.0*   HbA1C: No results for input(s): HGBA1C in the last 72 hours. CBG: No results for input(s): GLUCAP in the last 168 hours. Lipid Profile: No results for input(s): CHOL, HDL, LDLCALC, TRIG, CHOLHDL, LDLDIRECT in the last 72 hours. Thyroid  Function Tests: Recent Labs    08/09/24 1625  TSH 2.500   Anemia Panel: No results for input(s): VITAMINB12, FOLATE, FERRITIN, TIBC, IRON, RETICCTPCT in the last 72 hours. Sepsis Labs: Recent Labs  Lab 08/09/24 1128  LATICACIDVEN 1.9    No results found for this or any previous visit (from the past 240 hours).       Radiology Studies: CT Angio Chest/Abd/Pel for Dissection W and/or Wo Contrast Result Date: 08/09/2024 EXAM: CTA CHEST, ABDOMEN AND PELVIS WITH AND WITHOUT CONTRAST  08/09/2024 12:12:52 PM TECHNIQUE: CTA of the chest was performed with and without the administration of 100 mL of iohexol  (OMNIPAQUE ) 350 MG/ML injection. CTA of the abdomen and pelvis was performed with the administration of 100 mL of iohexol  (OMNIPAQUE ) 350 MG/ML injection. Multiplanar reformatted images are provided for review. MIP images are provided for review. Automated exposure control, iterative reconstruction, and/or weight based adjustment of the mA/kV was utilized to reduce the radiation dose to as low as reasonably achievable. COMPARISON: 06/02/2024 and previous. CLINICAL HISTORY: Acute aortic syndrome (AAS) suspected. Claudication or leg ischemia, right lower extremity claudication. FINDINGS: VASCULATURE: AORTA: Atheromatous thoracic aorta with minimal calcified plaque. No abdominal aortic aneurysm. No dissection. PULMONARY ARTERIES: No pulmonary embolism with the limits of this exam. GREAT VESSELS OF AORTIC ARCH: No acute finding. No dissection. No arterial occlusion or significant stenosis. CELIAC TRUNK: No acute finding. No occlusion or significant stenosis. SUPERIOR MESENTERIC ARTERY: No acute finding. No occlusion or significant stenosis. INFERIOR MESENTERIC ARTERY: No acute finding. No occlusion or significant stenosis.  RENAL ARTERIES: Two Right renal arteries, superior dominant, both patent. Aberrant trunk arising below the level of the IMA supplying both kidneys, widely patent proximally. Single left renal artery, patent. ILIAC ARTERIES: No acute finding. No occlusion or significant stenosis. CHEST: MEDIASTINUM: Subcentimeter prevascular and precarinal lymph nodes. Moderate scattered coronary calcifications. The heart and pericardium demonstrate no acute abnormality. LUNGS AND PLEURA: The lungs are without acute process. No focal consolidation or pulmonary edema. No evidence of pleural effusion or pneumothorax. THORACIC BONES AND SOFT TISSUES: Vertebral endplate spurring at multiple levels in  the lower thoracic spine. No acute soft tissue abnormality. ABDOMEN AND PELVIS: LIVER: The liver is unremarkable. GALLBLADDER AND BILE DUCTS: Gallbladder is unremarkable. No biliary ductal dilatation. SPLEEN: The spleen is unremarkable. PANCREAS: The pancreas is unremarkable. ADRENAL GLANDS: Bilateral adrenal glands demonstrate no acute abnormality. KIDNEYS, URETERS AND BLADDER: Horseshoe kidney. No stones in the kidneys or ureters. No hydronephrosis. No perinephric or periureteral stranding. Urinary bladder is unremarkable. GI AND BOWEL: Stomach and duodenal sweep demonstrate no acute abnormality. There is no bowel obstruction. No abnormal bowel wall thickening or distension. REPRODUCTIVE: Prostate enlargement with central coarse calcifications. PERITONEUM AND RETROPERITONEUM: No ascites or free air. LYMPH NODES: No lymphadenopathy. ABDOMINAL BONES AND SOFT TISSUES: Mild facet DJD L4 - S1 right worse than left. No acute soft tissue abnormality. IMPRESSION: 1. No evidence of acute aortic syndrome, dissection, or aneurysm. 2. No pulmonary embolism. 3. Horseshoe kidney. No stones or hydronephrosis. 4. Prostatomegaly. Electronically signed by: Katheleen Faes MD 08/09/2024 12:45 PM EDT RP Workstation: [id number]   CT Angio Aortobifemoral W and/or Wo Contrast Result Date: 08/09/2024 EXAM: CTA AORTOBIFEM WITH RUNOFF 08/09/2024 12:12:52 PM TECHNIQUE: Contrast-enhanced computed tomography angiography of the lower extremity was performed with multiplanar reconstructions. Maximum intensity projection images were created on a separate workstation and reviewed. Automated exposure control, iterative reconstruction, and/or weight based adjustment of the mA/kV was utilized to reduce the radiation dose to as low as reasonably achievable. CONTRAST: 100mL iohexol  (OMNIPAQUE ) 350 MG/ML injection. COMPARISON: None available. CLINICAL HISTORY: Claudication or leg ischemia; Right lower extremity claudication. Acute aortic syndrome  (AAS) suspected; Claudication or leg ischemia, Right lower extremity claudication. FINDINGS: ARTERIAL: AORTA: Moderate calcified nonocclusive plaque in the visualized distal abdominal aorta without aneurysm. RIGHT LOWER EXTREMITY: RIGHT COMMON ILIAC ARTERY: Mildly atheromatous, patent. Mild origin stenosis right internal iliac artery, patent distally. RIGHT EXTERNAL ILIAC ARTERY: No significant stenosis or vessel occlusion. RIGHT COMMON FEMORAL ARTERY: Minimal plaque in the distal common femoral artery. RIGHT SUPERFICIAL FEMORAL ARTERY: Mild scattered calcified plaque without high-grade stenosis. RIGHT POPLITEAL ARTERY: Minimal plaque in the distal right popliteal artery without stenosis. RIGHT TIBIOPERONEAL TRUNK: atheromatous but apparently contiguous 3-vessel right tibial runoff. RIGHT ANTERIOR TIBIAL ARTERY: Flow is identified in the anterior tibial artery to the ankle. Flow identified in the dorsalis pedis artery. RIGHT PERONEAL ARTERY: Flow is identified to the ankle. RIGHT POSTERIOR TIBIAL ARTERY: No significant stenosis or vessel occlusion. Posterior tibial artery flow is present into the hindfoot. LEFT LOWER EXTREMITY: LEFT COMMON ILIAC ARTERY: Mild plaque. No plaque in the internal iliac. LEFT EXTERNAL ILIAC ARTERY: No significant stenosis or vessel occlusion. LEFT COMMON FEMORAL ARTERY: Mild plaque in the distal common femoral artery. LEFT SUPERFICIAL FEMORAL ARTERY: No significant stenosis or vessel occlusion. LEFT POPLITEAL ARTERY: Minimal plaque. LEFT TIBIOPERONEAL TRUNK: Minimally atheromatous but apparently contiguous 3-vessel tibial runoff, with some venous opacification limiting evaluation distally. LEFT ANTERIOR TIBIAL ARTERY: Flow is identified in the anterior tibial artery to the ankle. Flow identified in the  dorsalis pedis artery. LEFT PERONEAL ARTERY: Flow is identified to the ankle. LEFT POSTERIOR TIBIAL ARTERY: No significant stenosis or vessel occlusion. Posterior tibial artery flow is  present into the hindfoot. BONES AND SOFT TISSUES: Horseshoe kidney, incompletely visualized. Coarse calcifications in the prostate, mildly enlarged. No significant osseous abnormalities seen within the field of view. IMPRESSION: 1. Moderate calcified nonocclusive plaque in the distal abdominal aorta without aneurysm. 2. No hemodynamically significant lower extremity arterial occlusive disease identified. Electronically signed by: Katheleen Faes MD 08/09/2024 12:30 PM EDT RP Workstation: [id number]   DG Chest Portable 1 View Result Date: 08/09/2024 EXAM: 1 VIEW XRAY OF THE CHEST 08/09/2024 10:58:38 AM COMPARISON: 07/03/2024 CLINICAL HISTORY: Afib Rvr. Pt arrived via Pov, c/o states he woke feeling weak and right leg has been cold and hot. Hr 140s in triage. Hx of afib, states needed cardioversion the last time he had this issue. FINDINGS: LUNGS AND PLEURA: No focal pulmonary opacity. No pulmonary edema. No pleural effusion. No pneumothorax. HEART AND MEDIASTINUM: Numerous leads and wires over chest are noted. Normal heart size and mediastinal contours . BONES AND SOFT TISSUES: Lordotic positioning noted. No acute osseous abnormality. IMPRESSION: 1. No acute cardiopulmonary process. Electronically signed by: Norleen Kil MD 08/09/2024 12:07 PM EDT RP Workstation: [id number]        Scheduled Meds:  apixaban   5 mg Oral BID   atorvastatin   40 mg Oral Daily   budesonide -glycopyrrolate -formoterol  2 puff Inhalation BID   Chlorhexidine  Gluconate Cloth  6 each Topical Daily   clopidogrel   37.5 mg Oral Daily   furosemide   40 mg Intravenous Q12H   pantoprazole   40 mg Oral Daily   potassium chloride   40 mEq Oral BID   Continuous Infusions:  sodium chloride      amiodarone  30 mg/hr (08/10/24 0800)     LOS: 0 days    Time spent: 51 minutes    Renato Applebaum, MD [facility]

## 2024-08-11 ENCOUNTER — Encounter (HOSPITAL_COMMUNITY)
Admission: EM | Disposition: A | Discharge status: Home / Self Care | Payer: Self-pay | Source: Home / Self Care | Attending: Internal Medicine

## 2024-08-11 ENCOUNTER — Ambulatory Visit (HOSPITAL_COMMUNITY): Admission: RE | Admit: 2024-08-11 | Source: Home / Self Care | Admitting: Internal Medicine

## 2024-08-11 ENCOUNTER — Inpatient Hospital Stay (HOSPITAL_COMMUNITY): Admitting: Anesthesiology

## 2024-08-11 DIAGNOSIS — E785 Hyperlipidemia, unspecified: Secondary | ICD-10-CM

## 2024-08-11 DIAGNOSIS — I4891 Unspecified atrial fibrillation: Secondary | ICD-10-CM

## 2024-08-11 DIAGNOSIS — Z006 Encounter for examination for normal comparison and control in clinical research program: Secondary | ICD-10-CM

## 2024-08-11 DIAGNOSIS — I2510 Atherosclerotic heart disease of native coronary artery without angina pectoris: Secondary | ICD-10-CM

## 2024-08-11 DIAGNOSIS — I5020 Unspecified systolic (congestive) heart failure: Secondary | ICD-10-CM | POA: Diagnosis not present

## 2024-08-11 DIAGNOSIS — Z87891 Personal history of nicotine dependence: Secondary | ICD-10-CM

## 2024-08-11 HISTORY — PX: CARDIOVERSION: EP1203

## 2024-08-11 LAB — BASIC METABOLIC PANEL WITH GFR
Anion gap: 10 (ref 5–15)
BUN: 13 mg/dL (ref 8–23)
CO2: 29 mmol/L (ref 22–32)
Calcium: 9.6 mg/dL (ref 8.9–10.3)
Chloride: 100 mmol/L (ref 98–111)
Creatinine, Ser: 0.86 mg/dL (ref 0.61–1.24)
GFR, Estimated: >60 mL/min (ref >60)
Glucose, Bld: 116 mg/dL — ABNORMAL HIGH (ref 70–99)
Potassium: 3.7 mmol/L (ref 3.5–5.1)
Sodium: 139 mmol/L (ref 135–145)

## 2024-08-11 SURGERY — CARDIOVERSION (CATH LAB)
Anesthesia: General

## 2024-08-11 MED ORDER — BUDESON-GLYCOPYRROL-FORMOTEROL 160-9-4.8 MCG/ACT IN AERO
2.0000 | INHALATION_SPRAY | Freq: Two times a day (BID) | RESPIRATORY_TRACT | Status: DC
  Administered 2024-08-12 – 2024-08-13 (×3): 2 via RESPIRATORY_TRACT
  Filled 2024-08-11: qty 5.9

## 2024-08-11 MED ORDER — SODIUM CHLORIDE 0.9 % IV SOLN: INTRAVENOUS | Status: DC

## 2024-08-11 MED ORDER — PROPOFOL 10 MG/ML IV BOLUS
INTRAVENOUS | Status: DC | PRN
  Administered 2024-08-11: 70 mg via INTRAVENOUS

## 2024-08-11 MED ORDER — SODIUM CHLORIDE 0.9% FLUSH
INTRAVENOUS | Status: DC | PRN
  Administered 2024-08-11: 70 mL via INTRAVENOUS

## 2024-08-11 MED ORDER — LIDOCAINE 2% (20 MG/ML) 5 ML SYRINGE
INTRAMUSCULAR | Status: DC | PRN
  Administered 2024-08-11: 80 mg via INTRAVENOUS

## 2024-08-11 SURGICAL SUPPLY — 1 items: PAD DEFIB RADIO PHYSIO CONN (PAD) ×1 IMPLANT

## 2024-08-11 NOTE — Research (Addendum)
 [facility] Cardioversion Informed Consent   Subject Name: [name]  Subject met inclusion and exclusion criteria.  The informed consent form, study requirements and expectations were reviewed with the subject and questions and concerns were addressed prior to the signing of the consent form.  The subject verbalized understanding of the trial requirements.  The subject agreed to participate in the [facility] Cardioversion trial and signed the informed consent at 1030 on 31/Oct/2025.  The informed consent was obtained prior to performance of any protocol-specific procedures for the subject.  A copy of the signed informed consent was given to the subject and a copy was placed in the subject's medical record.   [name] [name]

## 2024-08-11 NOTE — Anesthesia Preprocedure Evaluation (Addendum)
 Anesthesia Evaluation  Patient identified by MRN, date of birth, ID band Patient awake    Reviewed: Allergy & Precautions, H&P , NPO status , Patient's Chart, lab work & pertinent test results  History of Anesthesia Complications (+) Family history of anesthesia reaction  Airway Mallampati: I  TM Distance: >3 FB     Dental  (+) Edentulous Upper, Edentulous Lower   Pulmonary asthma , sleep apnea and Continuous Positive Airway Pressure Ventilation , COPD,  COPD inhaler, former smoker   Pulmonary exam normal        Cardiovascular hypertension, Pt. 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 Neuromuscular disease CVA negative neurological ROS  negative psych ROS   GI/Hepatic negative GI ROS, Neg liver ROS, hiatal hernia,GERD  Medicated,,Dysphagia   Endo/Other  diabetes, Well Controlled, Type 2, Oral Hypoglycemic Agents  Obesity HLD Last HbA1C 5.7  Renal/GU Renal InsufficiencyRenal diseasenegative Renal ROSLab Results      Component                Value               Date                                 K                        3.7                 08/11/2024                            CREATININE               0.86                08/11/2024                           GLUCOSE                  116 (H)             08/11/2024             negative genitourinary   Musculoskeletal negative musculoskeletal ROS (+) Arthritis , Osteoarthritis,    Abdominal  (+) + obese  Peds  Hematology negative hematology ROS (+) Lab Results      Component                Value               Date                      WBC                      7.1                 08/10/2024                HGB  12.3 (L)            08/10/2024                HCT                      37.8 (L)            08/10/2024                MCV                      89.4                08/10/2024                PLT                      218                 08/10/2024              Anesthesia Other Findings All: Tylox Gabapentin  Tape  Reproductive/Obstetrics negative OB ROS                              Anesthesia Physical Anesthesia Plan  ASA: 3  Anesthesia Plan: General   Post-op Pain Management: Minimal or no pain anticipated   Induction: Intravenous  PONV Risk Score and Plan: 3 and Treatment may vary due to age or medical condition and Propofol  infusion  Airway Management Planned: Natural Airway and Nasal Cannula  Additional Equipment: None  Intra-op Plan:   Post-operative Plan:   Informed Consent: I have reviewed the patients History and Physical, chart, labs and discussed the procedure including the risks, benefits and alternatives for the proposed anesthesia with the patient or authorized representative who has indicated his/her understanding and acceptance.     Dental advisory given  Plan Discussed with: CRNA and Anesthesiologist  Anesthesia Plan Comments:          Anesthesia Quick Evaluation

## 2024-08-11 NOTE — Plan of Care (Signed)
  Problem: Health Behavior/Discharge Planning: Goal: Ability to manage health-related needs will improve Outcome: Progressing   Problem: Clinical Measurements: Goal: Ability to maintain clinical measurements within normal limits will improve Outcome: Progressing   Problem: Clinical Measurements: Goal: Will remain free from infection Outcome: Progressing   Problem: Clinical Measurements: Goal: Respiratory complications will improve Outcome: Progressing   Problem: Clinical Measurements: Goal: Cardiovascular complication will be avoided Outcome: Progressing   Problem: Nutrition: Goal: Adequate nutrition will be maintained Outcome: Progressing

## 2024-08-11 NOTE — Progress Notes (Signed)
   08/11/24 1402  TOC Brief Assessment  Insurance and Status Reviewed  Patient has primary care physician Yes  Home environment has been reviewed Single family home  Prior level of function: Independnet with ADL's  Prior/Current Home Services No current home services  Social Drivers of Health Review SDOH reviewed no interventions necessary  Readmission risk has been reviewed Yes  Transition of care needs no transition of care needs at this time

## 2024-08-11 NOTE — Anesthesia Postprocedure Evaluation (Signed)
 Anesthesia Post Note  Patient: [name]  Procedure(s) Performed: CARDIOVERSION     Patient location during evaluation: Cath Lab Anesthesia Type: General Level of consciousness: awake and alert Pain management: pain level controlled Vital Signs Assessment: post-procedure vital signs reviewed and stable Respiratory status: spontaneous breathing, nonlabored ventilation, respiratory function stable and patient connected to nasal cannula oxygen Cardiovascular status: blood pressure returned to baseline and stable Postop Assessment: no apparent nausea or vomiting Anesthetic complications: no   No notable events documented.  Last Vitals:  Vitals:   08/11/24 1116 08/11/24 1126  BP: 105/69 115/77  Pulse: 65 66  Resp: 18 13  Temp:    SpO2: 94% 95%    Last Pain:  Vitals:   08/11/24 1126  TempSrc:   PainSc: 0-No pain                 Garnette DELENA Gab

## 2024-08-11 NOTE — Progress Notes (Signed)
 PROGRESS NOTE  [name] FMW:[id number] DOB: [birth year] DOA: 08/09/2024 PCP: [name], MD   LOS: 1 day   Brief narrative:  63 year old with obesity, paroxysmal A-fib on amiodarone  and Eliquis , nonobstructive coronary artery disease, history of TIA, carotid artery disease, chronic venous insufficiency and leg swelling, chronic dyspnea presented to the hospital with dizziness, lightheadedness, chest discomfort and sweating.  In the ED patient was noted to be in atrial fibrillation with rapid ventricular response at 148 bpm.  Labs were notable for potassium of 3.3 with proBNP at 1300.  TSH was 2.5.  Patient was then admitted hospital for further evaluation and treatment .     Assessment/Plan: Principal Problem:   Atrial fibrillation with rapid ventricular response (HCC) Active Problems:   COPD (chronic obstructive pulmonary disease) (HCC)   GERD (gastroesophageal reflux disease)   HFrEF (heart failure with reduced ejection fraction) (HCC)   Persistent atrial fibrillation. Patient required amiodarone  infusion.  Continue Eliquis .  Previous echocardiogram with normal EF.  TSH within normal. Cardiology on board.  History of DC cardioversion 2023.  2D echocardiogram this time shows LVEF  of 35 to 40% with global hypokinesis.  Start post cardioversion with conversion to normal sinus rhythm at the time of my exam  Sleep apnea: Untreated. will need outpatient sleep study.  Possibly home on nighttime oxygen.   Chest pain: atypical.  Negative troponin, review of previous cardiac cath with mild obstructive coronary artery disease.  CT angiogram negative for PE.   Chronic diastolic heart failure: proBNP is elevated.  Leg swelling is chronic.  Had worsening shortness of breath.  Received dose of IV Lasix  during hospitalization  GERD: On PPI.   COPD: No exacerbation.  Continue bronchodilators   Chronic venous stasis: Unable to tolerate compression stockings.  Elevate legs.  Class II  obesity.  Body mass index is 37.48 kg/m. Patient would benefit from lifestyle modification and weight loss as outpatient  DVT prophylaxis: Place and maintain sequential compression device Start: 08/09/24 2051 apixaban  (ELIQUIS ) tablet 5 mg   Disposition: Home  Status is: Inpatient Remains inpatient appropriate because: Atrial fibrillation with rapid ventricular response status post cardioversion    Code Status:     Code Status: Full Code  Family Communication: None at bedside  Consultants: Cardiology  Procedures: DC cardioversion 08/11/2024  Anti-infectives:  None  Anti-infectives (From admission, onward)    None        Subjective: Today, patient was seen and examined at bedside.  Seen after cardioversion.  Denies any chest pain, shortness of breath, dizziness  Objective: Vitals:   08/11/24 1232 08/11/24 1300  BP: (!) 136/91 123/70  Pulse: 68 68  Resp: 15 20  Temp:    SpO2: 91% 92%    Intake/Output Summary (Last 24 hours) at 08/11/2024 1427 Last data filed at 08/11/2024 1300 Gross per 24 hour  Intake 321.52 ml  Output 2425 ml  Net -2103.48 ml   Filed Weights   08/09/24 1617  Weight: 111.8 kg   Body mass index is 37.48 kg/m.   Physical Exam:  GENERAL: Patient is alert awake and oriented. Not in obvious distress.  Obese built HENT: No scleral pallor or icterus. Pupils equally reactive to light. Oral mucosa is moist NECK: is supple, no gross swelling noted. CHEST: Decreased breath sounds bilaterally CVS: S1 and S2 heard, no murmur. Regular rate and rhythm.  ABDOMEN: Soft, non-tender, bowel sounds are present. EXTREMITIES: No edema. CNS: Cranial nerves are intact. No focal motor deficits. SKIN: warm and  dry without rashes.  Data Review: I have personally reviewed the following laboratory data and studies,  CBC: Recent Labs  Lab 08/09/24 1055 08/09/24 1101 08/10/24 0324  WBC 8.8  --  7.1  NEUTROABS 5.6  --   --   HGB 14.5 15.0 12.3*  HCT  44.1 44.0 37.8*  MCV 89.6  --  89.4  PLT 269  --  218   Basic Metabolic Panel: Recent Labs  Lab 08/09/24 1101 08/10/24 0324 08/11/24 0324  NA 141 137 139  K 3.3* 3.6 3.7  CL 98 99 100  CO2  --  28 29  GLUCOSE 130* 113* 116*  BUN 13 12 13   CREATININE 1.10 0.93 0.86  CALCIUM   --  9.0 9.6  MG  --  2.1  --   PHOS  --  3.4  --    Liver Function Tests: Recent Labs  Lab 08/10/24 0324  AST 18  ALT 16  ALKPHOS 88  BILITOT 0.5  PROT 6.5  ALBUMIN 3.8   No results for input(s): LIPASE, AMYLASE in the last 168 hours. No results for input(s): AMMONIA in the last 168 hours. Cardiac Enzymes: No results for input(s): CKTOTAL, CKMB, CKMBINDEX, TROPONINI in the last 168 hours. BNP (last 3 results) No results for input(s): BNP in the last 8760 hours.  ProBNP (last 3 results) Recent Labs    08/09/24 1055  PROBNP 1,350.0*    CBG: No results for input(s): GLUCAP in the last 168 hours. No results found for this or any previous visit (from the past 240 hours).   Studies: EP STUDY Result Date: 08/11/2024 See surgical note for result.  ECHOCARDIOGRAM COMPLETE Result Date: 08/10/2024    ECHOCARDIOGRAM REPORT   Patient Name:   [name] Date of Exam: 08/10/2024 Medical Rec #:  [id number]      Height:       68.0 in Accession #:    [id number]     Weight:       246.5 lb Date of Birth:  05-31-1961       BSA:          2.234 m Patient Age:    63 years       BP:           124/75 mmHg Patient Gender: M              HR:           92 bpm. Exam Location:  Inpatient Procedure: 2D Echo and Intracardiac Opacification Agent (Both Spectral and Color            Flow Doppler were utilized during procedure). Indications:    Afib  History:        Patient has prior history of Echocardiogram examinations.                 Arrythmias:Atrial Fibrillation.  Sonographer:    Norleen Amour Referring Phys: [id number] SHENG L HALEY IMPRESSIONS  1. Left ventricular ejection fraction, by estimation, is  35 to 40%. The left ventricle has moderately decreased function. The left ventricle demonstrates global hypokinesis. Left ventricular diastolic function could not be evaluated.  2. Right ventricular systolic function is mildly reduced. The right ventricular size is mildly enlarged. Tricuspid regurgitation signal is inadequate for assessing PA pressure.  3. Left atrial size was severely dilated.  4. The mitral valve is normal in structure. No evidence of mitral valve regurgitation. No evidence of mitral stenosis.  5. The aortic valve  is tricuspid. Aortic valve regurgitation is not visualized. Aortic valve sclerosis is present, with no evidence of aortic valve stenosis. Aortic valve area, by VTI measures 2.20 cm. Aortic valve mean gradient measures 5.0 mmHg. Aortic valve Vmax measures 1.47 m/s.  6. The inferior vena cava is dilated in size with >50% respiratory variability, suggesting right atrial pressure of 8 mmHg. FINDINGS  Left Ventricle: Left ventricular ejection fraction, by estimation, is 35 to 40%. The left ventricle has moderately decreased function. The left ventricle demonstrates global hypokinesis. Definity contrast agent was given IV to delineate the left ventricular endocardial borders. The left ventricular internal cavity size was normal in size. There is no left ventricular hypertrophy. Left ventricular diastolic function could not be evaluated due to atrial fibrillation. Left ventricular diastolic function could not be evaluated. Right Ventricle: The right ventricular size is mildly enlarged. No increase in right ventricular wall thickness. Right ventricular systolic function is mildly reduced. Tricuspid regurgitation signal is inadequate for assessing PA pressure. Left Atrium: Left atrial size was severely dilated. Right Atrium: Right atrial size was normal in size. Pericardium: There is no evidence of pericardial effusion. Mitral Valve: The mitral valve is normal in structure. No evidence of mitral  valve regurgitation. No evidence of mitral valve stenosis. Tricuspid Valve: The tricuspid valve is normal in structure. Tricuspid valve regurgitation is trivial. No evidence of tricuspid stenosis. Aortic Valve: The aortic valve is tricuspid. Aortic valve regurgitation is not visualized. Aortic valve sclerosis is present, with no evidence of aortic valve stenosis. Aortic valve mean gradient measures 5.0 mmHg. Aortic valve peak gradient measures 8.6  mmHg. Aortic valve area, by VTI measures 2.20 cm. Pulmonic Valve: The pulmonic valve was normal in structure. Pulmonic valve regurgitation is not visualized. No evidence of pulmonic stenosis. Aorta: The aortic root is normal in size and structure. Venous: The inferior vena cava is dilated in size with greater than 50% respiratory variability, suggesting right atrial pressure of 8 mmHg. IAS/Shunts: No atrial level shunt detected by color flow Doppler.  LEFT VENTRICLE PLAX 2D LVIDd:         5.70 cm      Diastology LVIDs:         3.90 cm      LV e' medial:    7.62 cm/s LV PW:         1.10 cm      LV E/e' medial:  13.3 LV IVS:        0.90 cm      LV e' lateral:   10.55 cm/s LVOT diam:     2.40 cm      LV E/e' lateral: 9.6 LV SV:         61 LV SV Index:   27 LVOT Area:     4.52 cm  LV Volumes (MOD) LV vol d, MOD A2C: 177.0 ml LV vol d, MOD A4C: 208.0 ml LV vol s, MOD A2C: 85.2 ml LV vol s, MOD A4C: 119.0 ml LV SV MOD A2C:     91.8 ml LV SV MOD A4C:     208.0 ml LV SV MOD BP:      99.7 ml RIGHT VENTRICLE            IVC RV Basal diam:  4.40 cm    IVC diam: 2.10 cm RV S prime:     8.81 cm/s TAPSE (M-mode): 2.0 cm     PULMONARY VEINS  Diastolic Velocity: 59.60 cm/s                            S/D Velocity:       0.80                            Systolic Velocity:  50.10 cm/s LEFT ATRIUM              Index        RIGHT ATRIUM           Index LA diam:        3.80 cm  1.70 cm/m   RA Area:     20.60 cm LA Vol (A2C):   92.8 ml  41.55 ml/m  RA Volume:    63.70 ml  28.52 ml/m LA Vol (A4C):   98.9 ml  44.28 ml/m LA Biplane Vol: 105.0 ml 47.01 ml/m  AORTIC VALVE                    PULMONIC VALVE AV Area (Vmax):    2.41 cm     PV Vmax:       0.99 m/s AV Area (Vmean):   2.66 cm     PV Peak grad:  4.0 mmHg AV Area (VTI):     2.20 cm AV Vmax:           147.00 cm/s AV Vmean:          99.900 cm/s AV VTI:            0.277 m AV Peak Grad:      8.6 mmHg AV Mean Grad:      5.0 mmHg LVOT Vmax:         78.30 cm/s LVOT Vmean:        58.800 cm/s LVOT VTI:          0.135 m LVOT/AV VTI ratio: 0.49  AORTA Ao Root diam: 2.90 cm Ao Asc diam:  3.30 cm MV E velocity: 101.53 cm/s                             SHUNTS                             Systemic VTI:  0.14 m                             Systemic Diam: 2.40 cm Ivan Bihari MD Electronically signed by Ivan Bihari MD Signature Date/Time: 08/10/2024/3:33:20 PM    Final       Vernal Alstrom, MD  [facility] 08/11/2024  If 7PM-7AM, please contact night-coverage

## 2024-08-11 NOTE — Hospital Course (Signed)
 Brief Narrative:  63 year old with obesity, paroxysmal A-fib on amiodarone  and Eliquis , nonobstructive coronary artery disease, history of TIA, carotid artery disease, chronic venous insufficiency and leg swelling, chronic dyspnea presented to the hospital with dizziness, lightheadedness and had a feeling of .  In the emergency room he was found to be in rapid A-fib with heart rate 120-150 controlled with amiodarone .  Symptoms improved.  Admitted with amiodarone  infusion.   Assessment & Plan:   Persistent atrial fibrillation. Received amiodarone  infusion.  Continue Eliquis .  Previous echocardiogram with normal EF.  TSH within normal. Cardiology on board.  History of DC cardioversion 2023.  2D echocardiogram this time shows LVEF thin fraction of 35 to 40% with global hypokinesis.  Plan for DC cardioversion today.  Sleep apnea: Untreated. will need outpatient sleep study.  Possibly home on nighttime oxygen.   Chest pain: atypical.  Negative troponin review of previous cardiac cath with mild obstructive coronary artery disease.  CT angiogram negative for PE.   Chronic diastolic heart failure: proBNP is elevated.  Leg swelling is chronic.  Had worsening shortness of breath.  Received dose of IV Lasix  with  GERD: On PPI.   COPD: No exacerbation.  Continue bronchodilators   Chronic venous stasis: Unable to tolerate compression stockings.  Elevate legs.

## 2024-08-11 NOTE — CV Procedure (Addendum)
 Procedure:   DCCV  Indication:  Symptomatic atrial fibrillation  Procedure Note:  The patient signed informed consent.  They have had had therapeutic anticoagulation with Eliquis  greater than 3 weeks.  Anesthesia was administered by Dr. Jefm and Ariel Schmitz, CRNA.  Adequate airway was maintained throughout and vital followed per protocol.  They were cardioverted x 1 with 200J of biphasic synchronized energy.  They converted to NSR with rate 60s.  There were no apparent complications.  The patient had normal neuro status and respiratory status post procedure with vitals stable as recorded elsewhere.    Follow up:  They will continue on current medical therapy and follow up with cardiology as scheduled.  Lonni Nanas, MD 08/11/2024 10:52 AM

## 2024-08-11 NOTE — Progress Notes (Signed)
  Progress Note  Patient Name: [name] Date of Encounter: 08/11/2024 [facility] Cardiologist: [facility])  Interval Summary   Reports feeling good today Ready for DCCV later today  NPO since midnight Remains in A-Fib, HR 80-90s when I was in the room  Vital Signs Vitals:   08/10/24 2000 08/11/24 0039 08/11/24 0400 08/11/24 0600  BP:    117/75  Pulse:    (!) 25  Resp:    18  Temp: 97.8 F (36.6 C) 98.6 F (37 C) 97.8 F (36.6 C)   TempSrc: Oral Oral Oral   SpO2:    96%  Weight:      Height:        Intake/Output Summary (Last 24 hours) at 08/11/2024 0851 Last data filed at 08/11/2024 0359 Gross per 24 hour  Intake 568.75 ml  Output 3875 ml  Net -3306.25 ml      08/09/2024    4:17 PM 07/03/2024    9:44 AM 04/21/2024    8:56 AM  Last 3 Weights  Weight (lbs) 246 lb 7.6 oz 262 lb 242 lb 8.1 oz  Weight (kg) 111.8 kg 118.842 kg 110 kg     Telemetry/ECG  Rate controlled A-Fib, HR 90s - Personally Reviewed  Physical Exam  GEN: No acute distress.   Neck: No JVD Cardiac: iRRR, distant heart sounds.  Respiratory: Clear to auscultation bilaterally. GI: Soft, nontender, non-distended  MS: No edema, chronic venous skin changes   Assessment & Plan  [name] is a 63 y.o. 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; s/p R MCA stent)  who is being seen for A-fib RVR   A-Fib RVR Echo this admission: LVEF 35-40%, severe LAE Reports being on PO amiodarone  since 2015  Currently on IV amiodarone   Plan for DCCV today  Remains in rate controlled A-Fib, HR 90s Continue IV amiodarone  Continue Eliquis  5 mg BID  Consider EP follow up at discharge to discuss ablation due to age  NPO since midnight due to DCCV planned for today   Chronic diastolic heart failure with newly reduced EF  Echo this admission: LVEF 35-40%, mildly reduced RV systolic function, dilated IVC Given IV  Lasix  with good urine response  Kidney function stable Appears euvolemic on exam  Will start/titrate GDMT today after DCCV after assessing HR and BP  Continue daily weights, BMPs, strict I&O's  Mild nonobstructive CAD Noted on cardiac catheterization 06/2023 at [facility]  Negative troponin x 2  No anginal symptoms  Continue home Plavix  37.5 mg daily -- for carotid stents  Continue Lipitor 40 mg daily   OSA  Previously on CPAP until 2 years ago, machine recalled Has not yet replaced Will need to be set up for [facility] sleep study  Hypertension  BP has been stable this admission  Appears low-normal this morning Adjust medications post DCCV as discussed above   Hyperlipidemia  LDL 77 in 05/2023 Continue Lipitor 40 mg daily    For questions or updates, please contact [facility] Please consult [email or web address] for contact info under        Signed, Waddell DELENA Donath, PA-C

## 2024-08-11 NOTE — Progress Notes (Signed)
 Pt left for procedure at [facility] via [facility]. Pt appears to be in no apparent distress, breathing is regular, unlabored, and symmetrical.

## 2024-08-11 NOTE — Transfer of Care (Signed)
 Immediate Anesthesia Transfer of Care Note  Patient: [name]  Procedure(s) Performed: CARDIOVERSION  Patient Location: PACU  Anesthesia Type:MAC  Level of Consciousness: awake  Airway & Oxygen Therapy: Patient Spontanous Breathing and Patient connected to nasal cannula oxygen  Post-op Assessment: Report given to RN and Post -op Vital signs reviewed and stable  Post vital signs: Reviewed and stable  Last Vitals:  Vitals Value Taken Time  BP 136/91 08/11/24 12:31  Temp 36.4 C 08/11/24 10:56  Pulse 68 08/11/24 12:46  Resp 17 08/11/24 12:46  SpO2 93 % 08/11/24 12:46  Vitals shown include unfiled device data.  Last Pain:  Vitals:   08/11/24 1126  TempSrc:   PainSc: 0-No pain         Complications: No notable events documented.

## 2024-08-11 NOTE — Interval H&P Note (Signed)
 History and Physical Interval Note:  08/11/2024 10:03 AM  [name]  has presented today for surgery, with the diagnosis of afib.  The various methods of treatment have been discussed with the patient and family. After consideration of risks, benefits and other options for treatment, the patient has consented to  Procedure(s): CARDIOVERSION (N/A) as a surgical intervention.  The patient's history has been reviewed, patient examined, no change in status, stable for surgery.  I have reviewed the patient's chart and labs.  Questions were answered to the patient's satisfaction.     Lonni LITTIE Nanas

## 2024-08-11 NOTE — Progress Notes (Signed)
 Heart Failure Navigator Progress Note  Assessed for Heart & Vascular TOC clinic readiness.  Patient does not meet criteria due to per MD note patient is seen by [facility] Cardiology. No HF TOC. .   Navigator will sign off at this time.   Stephane Haddock, BSN, Scientist, Clinical (histocompatibility And Immunogenetics) Only

## 2024-08-11 NOTE — Plan of Care (Signed)
  Problem: Clinical Measurements: Goal: Respiratory complications will improve Outcome: Progressing Goal: Cardiovascular complication will be avoided Outcome: Progressing   Problem: Nutrition: Goal: Adequate nutrition will be maintained Outcome: Progressing   Problem: Elimination: Goal: Will not experience complications related to urinary retention Outcome: Progressing   Problem: Pain Managment: Goal: General experience of comfort will improve and/or be controlled Outcome: Progressing

## 2024-08-12 ENCOUNTER — Encounter (HOSPITAL_COMMUNITY): Payer: Self-pay | Admitting: Cardiology

## 2024-08-12 DIAGNOSIS — I4891 Unspecified atrial fibrillation: Secondary | ICD-10-CM | POA: Diagnosis not present

## 2024-08-12 LAB — BASIC METABOLIC PANEL WITH GFR
Anion gap: 10 (ref 5–15)
BUN: 14 mg/dL (ref 8–23)
CO2: 26 mmol/L (ref 22–32)
Calcium: 9.3 mg/dL (ref 8.9–10.3)
Chloride: 102 mmol/L (ref 98–111)
Creatinine, Ser: 0.84 mg/dL (ref 0.61–1.24)
GFR, Estimated: >60 mL/min (ref >60)
Glucose, Bld: 120 mg/dL — ABNORMAL HIGH (ref 70–99)
Potassium: 4.1 mmol/L (ref 3.5–5.1)
Sodium: 138 mmol/L (ref 135–145)

## 2024-08-12 LAB — CBC
HCT: 42.8 % (ref 39.0–52.0)
Hemoglobin: 13.2 g/dL (ref 13.0–17.0)
MCH: 28.3 pg (ref 26.0–34.0)
MCHC: 30.8 g/dL (ref 30.0–36.0)
MCV: 91.6 fL (ref 80.0–100.0)
Platelets: 237 K/uL (ref 150–400)
RBC: 4.67 MIL/uL (ref 4.22–5.81)
RDW: 13 % (ref 11.5–15.5)
WBC: 8 K/uL (ref 4.0–10.5)
nRBC: 0 % (ref 0.0–0.2)

## 2024-08-12 LAB — MAGNESIUM: Magnesium: 2.2 mg/dL (ref 1.7–2.4)

## 2024-08-12 MED ORDER — LOSARTAN POTASSIUM 25 MG PO TABS
12.5000 mg | ORAL_TABLET | Freq: Every day | ORAL | Status: DC
  Administered 2024-08-12: 12.5 mg via ORAL
  Filled 2024-08-12 (×2): qty 0.5

## 2024-08-12 MED ORDER — LOSARTAN POTASSIUM 50 MG PO TABS: 25.0000 mg | ORAL_TABLET | Freq: Every day | ORAL | Status: DC

## 2024-08-12 MED ORDER — AMIODARONE HCL 200 MG PO TABS
400.0000 mg | ORAL_TABLET | Freq: Three times a day (TID) | ORAL | Status: DC
  Administered 2024-08-12 – 2024-08-13 (×4): 400 mg via ORAL
  Filled 2024-08-12 (×4): qty 2

## 2024-08-12 MED ORDER — AMIODARONE HCL 200 MG PO TABS: 100.0000 mg | ORAL_TABLET | Freq: Every day | ORAL | Status: DC

## 2024-08-12 NOTE — Plan of Care (Signed)

## 2024-08-12 NOTE — Progress Notes (Signed)
 PROGRESS NOTE  [name] FMW:[id number] DOB: 06-26-1961 DOA: 08/09/2024 PCP: [name], MD   LOS: 2 days   Brief narrative:  63 year old with obesity, paroxysmal A-fib on amiodarone  and Eliquis , nonobstructive coronary artery disease, history of TIA, carotid artery disease, chronic venous insufficiency and leg swelling, chronic dyspnea presented to the hospital with dizziness, lightheadedness, chest discomfort and sweating.  In the ED, patient was noted to be in atrial fibrillation with rapid ventricular response at 148 bpm.  Labs were notable for potassium of 3.3 with proBNP at 1300.  TSH was 2.5.  Patient was then admitted hospital for further evaluation and treatment .     Assessment/Plan: Principal Problem:   Atrial fibrillation with rapid ventricular response (HCC) Active Problems:   COPD (chronic obstructive pulmonary disease) (HCC)   GERD (gastroesophageal reflux disease)   HFrEF (heart failure with reduced ejection fraction) (HCC)   Persistent atrial fibrillation. Patient initially required amiodarone  infusion.  Continue Eliquis .  Previous echocardiogram with normal EF.  TSH within normal.History of DC cardioversion 2023.  2D echocardiogram this time showed LVEF  of 35 to 40% with global hypokinesis.  Seen by cardiology and underwent  cardioversion with conversion to normal sinus rhythm at the time of my exam.  Still on amiodarone  high-dose.  Cardiology wishes to observe the patient for 1 more day and potential disposition by tomorrow if remains acute  Sleep apnea: Untreated. will need outpatient sleep study.     Chest pain: atypical.  Negative troponin, review of previous cardiac cath with mild obstructive coronary artery disease.  CT angiogram negative for PE.   Chronic diastolic heart failure: proBNP is elevated.  Leg swelling is chronic.  Received IV Lasix  during hospitalization.  Cardiology following. GERD: On PPI.   COPD: No exacerbation.  Continue  bronchodilators   Chronic venous stasis: .  Elevate legs.  Class II obesity.  Body mass index is 37.48 kg/m. Patient would benefit from lifestyle modification and weight loss as outpatient  DVT prophylaxis: Place and maintain sequential compression device Start: 08/09/24 2051 apixaban  (ELIQUIS ) tablet 5 mg   Disposition: Home  Status is: Inpatient Remains inpatient appropriate because: Atrial fibrillation with rapid ventricular response status post cardioversion, amiodarone  load    Code Status:     Code Status: Full Code  Family Communication: None at bedside  Consultants: Cardiology  Procedures: DC cardioversion 08/11/2024  Anti-infectives:  None  Anti-infectives (From admission, onward)    None        Subjective: Today, patient was seen and examined at bedside.  Patient denies any dyspnea, dizziness, lightheadedness, fever, chills or shortness of breath.  Had mild chest discomfort this morning which resolved on its own.  Objective: Vitals:   08/12/24 0800 08/12/24 0923  BP: 138/86   Pulse: (!) 59   Resp:    Temp:  97.8 F (36.6 C)  SpO2: 98%     Intake/Output Summary (Last 24 hours) at 08/12/2024 1103 Last data filed at 08/12/2024 0800 Gross per 24 hour  Intake 180.52 ml  Output 700 ml  Net -519.48 ml   Filed Weights   08/09/24 1617  Weight: 111.8 kg   Body mass index is 37.48 kg/m.   Physical Exam:  GENERAL: Patient is alert awake and oriented. Not in obvious distress.  Obese built HENT: No scleral pallor or icterus. Pupils equally reactive to light. Oral mucosa is moist NECK: is supple, no gross swelling noted. CHEST: Decreased breath sounds bilaterally, nontender on chest wall palpation. CVS: S1  and S2 heard, no murmur. Regular rate and rhythm.  ABDOMEN: Soft, non-tender, bowel sounds are present. EXTREMITIES: No edema. CNS: Cranial nerves are intact. No focal motor deficits. SKIN: warm and dry without rashes.  Data Review: I have  personally reviewed the following laboratory data and studies,  CBC: Recent Labs  Lab 08/09/24 1055 08/09/24 1101 08/10/24 0324 08/12/24 0244  WBC 8.8  --  7.1 8.0  NEUTROABS 5.6  --   --   --   HGB 14.5 15.0 12.3* 13.2  HCT 44.1 44.0 37.8* 42.8  MCV 89.6  --  89.4 91.6  PLT 269  --  218 237   Basic Metabolic Panel: Recent Labs  Lab 08/09/24 1101 08/10/24 0324 08/11/24 0324 08/12/24 0244  NA 141 137 139 138  K 3.3* 3.6 3.7 4.1  CL 98 99 100 102  CO2  --  28 29 26   GLUCOSE 130* 113* 116* 120*  BUN 13 12 13 14   CREATININE 1.10 0.93 0.86 0.84  CALCIUM   --  9.0 9.6 9.3  MG  --  2.1  --  2.2  PHOS  --  3.4  --   --    Liver Function Tests: Recent Labs  Lab 08/10/24 0324  AST 18  ALT 16  ALKPHOS 88  BILITOT 0.5  PROT 6.5  ALBUMIN 3.8   No results for input(s): LIPASE, AMYLASE in the last 168 hours. No results for input(s): AMMONIA in the last 168 hours. Cardiac Enzymes: No results for input(s): CKTOTAL, CKMB, CKMBINDEX, TROPONINI in the last 168 hours. BNP (last 3 results) No results for input(s): BNP in the last 8760 hours.  ProBNP (last 3 results) Recent Labs    08/09/24 1055  PROBNP 1,350.0*    CBG: No results for input(s): GLUCAP in the last 168 hours. No results found for this or any previous visit (from the past 240 hours).   Studies: EP STUDY Result Date: 08/11/2024 See surgical note for result.  ECHOCARDIOGRAM COMPLETE Result Date: 08/10/2024    ECHOCARDIOGRAM REPORT   Patient Name:   [name] Date of Exam: 08/10/2024 Medical Rec #:  [id number]      Height:       68.0 in Accession #:    [id number]     Weight:       246.5 lb Date of Birth:  01/26/1961       BSA:          2.234 m Patient Age:    63 years       BP:           124/75 mmHg Patient Gender: M              HR:           92 bpm. Exam Location:  Inpatient Procedure: 2D Echo and Intracardiac Opacification Agent (Both Spectral and Color            Flow Doppler were  utilized during procedure). Indications:    Afib  History:        Patient has prior history of Echocardiogram examinations.                 Arrythmias:Atrial Fibrillation.  Sonographer:    Norleen Amour Referring Phys: [id number] SHENG L HALEY IMPRESSIONS  1. Left ventricular ejection fraction, by estimation, is 35 to 40%. The left ventricle has moderately decreased function. The left ventricle demonstrates global hypokinesis. Left ventricular diastolic function could not be evaluated.  2.  Right ventricular systolic function is mildly reduced. The right ventricular size is mildly enlarged. Tricuspid regurgitation signal is inadequate for assessing PA pressure.  3. Left atrial size was severely dilated.  4. The mitral valve is normal in structure. No evidence of mitral valve regurgitation. No evidence of mitral stenosis.  5. The aortic valve is tricuspid. Aortic valve regurgitation is not visualized. Aortic valve sclerosis is present, with no evidence of aortic valve stenosis. Aortic valve area, by VTI measures 2.20 cm. Aortic valve mean gradient measures 5.0 mmHg. Aortic valve Vmax measures 1.47 m/s.  6. The inferior vena cava is dilated in size with >50% respiratory variability, suggesting right atrial pressure of 8 mmHg. FINDINGS  Left Ventricle: Left ventricular ejection fraction, by estimation, is 35 to 40%. The left ventricle has moderately decreased function. The left ventricle demonstrates global hypokinesis. Definity contrast agent was given IV to delineate the left ventricular endocardial borders. The left ventricular internal cavity size was normal in size. There is no left ventricular hypertrophy. Left ventricular diastolic function could not be evaluated due to atrial fibrillation. Left ventricular diastolic function could not be evaluated. Right Ventricle: The right ventricular size is mildly enlarged. No increase in right ventricular wall thickness. Right ventricular systolic function is mildly reduced.  Tricuspid regurgitation signal is inadequate for assessing PA pressure. Left Atrium: Left atrial size was severely dilated. Right Atrium: Right atrial size was normal in size. Pericardium: There is no evidence of pericardial effusion. Mitral Valve: The mitral valve is normal in structure. No evidence of mitral valve regurgitation. No evidence of mitral valve stenosis. Tricuspid Valve: The tricuspid valve is normal in structure. Tricuspid valve regurgitation is trivial. No evidence of tricuspid stenosis. Aortic Valve: The aortic valve is tricuspid. Aortic valve regurgitation is not visualized. Aortic valve sclerosis is present, with no evidence of aortic valve stenosis. Aortic valve mean gradient measures 5.0 mmHg. Aortic valve peak gradient measures 8.6  mmHg. Aortic valve area, by VTI measures 2.20 cm. Pulmonic Valve: The pulmonic valve was normal in structure. Pulmonic valve regurgitation is not visualized. No evidence of pulmonic stenosis. Aorta: The aortic root is normal in size and structure. Venous: The inferior vena cava is dilated in size with greater than 50% respiratory variability, suggesting right atrial pressure of 8 mmHg. IAS/Shunts: No atrial level shunt detected by color flow Doppler.  LEFT VENTRICLE PLAX 2D LVIDd:         5.70 cm      Diastology LVIDs:         3.90 cm      LV e' medial:    7.62 cm/s LV PW:         1.10 cm      LV E/e' medial:  13.3 LV IVS:        0.90 cm      LV e' lateral:   10.55 cm/s LVOT diam:     2.40 cm      LV E/e' lateral: 9.6 LV SV:         61 LV SV Index:   27 LVOT Area:     4.52 cm  LV Volumes (MOD) LV vol d, MOD A2C: 177.0 ml LV vol d, MOD A4C: 208.0 ml LV vol s, MOD A2C: 85.2 ml LV vol s, MOD A4C: 119.0 ml LV SV MOD A2C:     91.8 ml LV SV MOD A4C:     208.0 ml LV SV MOD BP:      99.7 ml RIGHT VENTRICLE  IVC RV Basal diam:  4.40 cm    IVC diam: 2.10 cm RV S prime:     8.81 cm/s TAPSE (M-mode): 2.0 cm     PULMONARY VEINS                            Diastolic  Velocity: 59.60 cm/s                            S/D Velocity:       0.80                            Systolic Velocity:  50.10 cm/s LEFT ATRIUM              Index        RIGHT ATRIUM           Index LA diam:        3.80 cm  1.70 cm/m   RA Area:     20.60 cm LA Vol (A2C):   92.8 ml  41.55 ml/m  RA Volume:   63.70 ml  28.52 ml/m LA Vol (A4C):   98.9 ml  44.28 ml/m LA Biplane Vol: 105.0 ml 47.01 ml/m  AORTIC VALVE                    PULMONIC VALVE AV Area (Vmax):    2.41 cm     PV Vmax:       0.99 m/s AV Area (Vmean):   2.66 cm     PV Peak grad:  4.0 mmHg AV Area (VTI):     2.20 cm AV Vmax:           147.00 cm/s AV Vmean:          99.900 cm/s AV VTI:            0.277 m AV Peak Grad:      8.6 mmHg AV Mean Grad:      5.0 mmHg LVOT Vmax:         78.30 cm/s LVOT Vmean:        58.800 cm/s LVOT VTI:          0.135 m LVOT/AV VTI ratio: 0.49  AORTA Ao Root diam: 2.90 cm Ao Asc diam:  3.30 cm MV E velocity: 101.53 cm/s                             SHUNTS                             Systemic VTI:  0.14 m                             Systemic Diam: 2.40 cm Wilbert Bihari MD Electronically signed by Wilbert Bihari MD Signature Date/Time: 08/10/2024/3:33:20 PM    Final       Vernal Alstrom, MD  [facility] 08/12/2024  If 7PM-7AM, please contact night-coverage

## 2024-08-12 NOTE — Progress Notes (Signed)
 Rounding Note   Patient Name: [name] Date of Encounter: 08/12/2024  [facility] Cardiologist: None   Subjective -Underwent DCCV yesterday for AF - No acute events overnight - Patient says that he feels well today with no complaints   Scheduled Meds:  apixaban   5 mg Oral BID   atorvastatin   40 mg Oral Daily   budesonide -glycopyrrolate -formoterol  2 puff Inhalation BID   Chlorhexidine  Gluconate Cloth  6 each Topical Daily   clopidogrel   37.5 mg Oral Daily   pantoprazole   40 mg Oral Daily   potassium chloride   20 mEq Oral Daily   Continuous Infusions:  amiodarone  30 mg/hr (08/11/24 2122)   PRN Meds: albuterol , nitroGLYCERIN , mouth rinse   Vital Signs  Vitals:   08/12/24 0300 08/12/24 0400 08/12/24 0500 08/12/24 0600  BP: (!) 101/44 137/70 131/79 (!) 66/38  Pulse: (!) 57 62 (!) 59 61  Resp:      Temp:      TempSrc:      SpO2: 99% 97% 97% 100%  Weight:      Height:        Intake/Output Summary (Last 24 hours) at 08/12/2024 0700 Last data filed at 08/11/2024 1511 Gross per 24 hour  Intake 92.61 ml  Output 225 ml  Net -132.39 ml      08/09/2024    4:17 PM 07/03/2024    9:44 AM 04/21/2024    8:56 AM  Last 3 Weights  Weight (lbs) 246 lb 7.6 oz 262 lb 242 lb 8.1 oz  Weight (kg) 111.8 kg 118.842 kg 110 kg      Telemetry Sinus bradycardia- Personally Reviewed  ECG  No new ECG  Physical Exam  GEN: No acute distress.   Neck: No JVD Cardiac: RRR, no murmurs, rubs, or gallops.  Respiratory: Clear to auscultation bilaterally. GI: Soft, nontender, non-distended  MS: No edema; No deformity. Neuro:  Nonfocal  Psych: Normal affect   Labs High Sensitivity Troponin:  No results for input(s): TROPONINIHS in the last 720 hours.   Chemistry Recent Labs  Lab 08/10/24 0324 08/11/24 0324 08/12/24 0244  NA 137 139 138  K 3.6 3.7 4.1  CL 99 100 102  CO2 28 29 26   GLUCOSE 113* 116* 120*  BUN 12 13 14   CREATININE 0.93 0.86 0.84  CALCIUM  9.0  9.6 9.3  MG 2.1  --  2.2  PROT 6.5  --   --   ALBUMIN 3.8  --   --   AST 18  --   --   ALT 16  --   --   ALKPHOS 88  --   --   BILITOT 0.5  --   --   GFRNONAA >60 >60 >60  ANIONGAP 10 10 10     Lipids No results for input(s): CHOL, TRIG, HDL, LABVLDL, LDLCALC, CHOLHDL in the last 168 hours.  Hematology Recent Labs  Lab 08/09/24 1055 08/09/24 1101 08/10/24 0324 08/12/24 0244  WBC 8.8  --  7.1 8.0  RBC 4.92  --  4.23 4.67  HGB 14.5 15.0 12.3* 13.2  HCT 44.1 44.0 37.8* 42.8  MCV 89.6  --  89.4 91.6  MCH 29.5  --  29.1 28.3  MCHC 32.9  --  32.5 30.8  RDW 13.0  --  13.0 13.0  PLT 269  --  218 237   Thyroid   Recent Labs  Lab 08/09/24 1625  TSH 2.500    BNP Recent Labs  Lab 08/09/24 1055  PROBNP  1,350.0*    DDimer No results for input(s): DDIMER in the last 168 hours.   Radiology  EP STUDY Result Date: 08/11/2024 See surgical note for result.  ECHOCARDIOGRAM COMPLETE Result Date: 08/10/2024    ECHOCARDIOGRAM REPORT   Patient Name:   [name] Date of Exam: 08/10/2024 Medical Rec #:  [id number]      Height:       68.0 in Accession #:    [id number]     Weight:       246.5 lb Date of Birth:  [birth year]       BSA:          2.234 m Patient Age:    63 years       BP:           124/75 mmHg Patient Gender: M              HR:           92 bpm. Exam Location:  Inpatient Procedure: 2D Echo and Intracardiac Opacification Agent (Both Spectral and Color            Flow Doppler were utilized during procedure). Indications:    Afib  History:        Patient has prior history of Echocardiogram examinations.                 Arrythmias:Atrial Fibrillation.  Sonographer:    Norleen Amour Referring Phys: [id number] SHENG L HALEY IMPRESSIONS  1. Left ventricular ejection fraction, by estimation, is 35 to 40%. The left ventricle has moderately decreased function. The left ventricle demonstrates global hypokinesis. Left ventricular diastolic function could not be evaluated.  2. Right  ventricular systolic function is mildly reduced. The right ventricular size is mildly enlarged. Tricuspid regurgitation signal is inadequate for assessing PA pressure.  3. Left atrial size was severely dilated.  4. The mitral valve is normal in structure. No evidence of mitral valve regurgitation. No evidence of mitral stenosis.  5. The aortic valve is tricuspid. Aortic valve regurgitation is not visualized. Aortic valve sclerosis is present, with no evidence of aortic valve stenosis. Aortic valve area, by VTI measures 2.20 cm. Aortic valve mean gradient measures 5.0 mmHg. Aortic valve Vmax measures 1.47 m/s.  6. The inferior vena cava is dilated in size with >50% respiratory variability, suggesting right atrial pressure of 8 mmHg. FINDINGS  Left Ventricle: Left ventricular ejection fraction, by estimation, is 35 to 40%. The left ventricle has moderately decreased function. The left ventricle demonstrates global hypokinesis. Definity contrast agent was given IV to delineate the left ventricular endocardial borders. The left ventricular internal cavity size was normal in size. There is no left ventricular hypertrophy. Left ventricular diastolic function could not be evaluated due to atrial fibrillation. Left ventricular diastolic function could not be evaluated. Right Ventricle: The right ventricular size is mildly enlarged. No increase in right ventricular wall thickness. Right ventricular systolic function is mildly reduced. Tricuspid regurgitation signal is inadequate for assessing PA pressure. Left Atrium: Left atrial size was severely dilated. Right Atrium: Right atrial size was normal in size. Pericardium: There is no evidence of pericardial effusion. Mitral Valve: The mitral valve is normal in structure. No evidence of mitral valve regurgitation. No evidence of mitral valve stenosis. Tricuspid Valve: The tricuspid valve is normal in structure. Tricuspid valve regurgitation is trivial. No evidence of tricuspid  stenosis. Aortic Valve: The aortic valve is tricuspid. Aortic valve regurgitation is not visualized. Aortic valve sclerosis is present,  with no evidence of aortic valve stenosis. Aortic valve mean gradient measures 5.0 mmHg. Aortic valve peak gradient measures 8.6  mmHg. Aortic valve area, by VTI measures 2.20 cm. Pulmonic Valve: The pulmonic valve was normal in structure. Pulmonic valve regurgitation is not visualized. No evidence of pulmonic stenosis. Aorta: The aortic root is normal in size and structure. Venous: The inferior vena cava is dilated in size with greater than 50% respiratory variability, suggesting right atrial pressure of 8 mmHg. IAS/Shunts: No atrial level shunt detected by color flow Doppler.  LEFT VENTRICLE PLAX 2D LVIDd:         5.70 cm      Diastology LVIDs:         3.90 cm      LV e' medial:    7.62 cm/s LV PW:         1.10 cm      LV E/e' medial:  13.3 LV IVS:        0.90 cm      LV e' lateral:   10.55 cm/s LVOT diam:     2.40 cm      LV E/e' lateral: 9.6 LV SV:         61 LV SV Index:   27 LVOT Area:     4.52 cm  LV Volumes (MOD) LV vol d, MOD A2C: 177.0 ml LV vol d, MOD A4C: 208.0 ml LV vol s, MOD A2C: 85.2 ml LV vol s, MOD A4C: 119.0 ml LV SV MOD A2C:     91.8 ml LV SV MOD A4C:     208.0 ml LV SV MOD BP:      99.7 ml RIGHT VENTRICLE            IVC RV Basal diam:  4.40 cm    IVC diam: 2.10 cm RV S prime:     8.81 cm/s TAPSE (M-mode): 2.0 cm     PULMONARY VEINS                            Diastolic Velocity: 59.60 cm/s                            S/D Velocity:       0.80                            Systolic Velocity:  50.10 cm/s LEFT ATRIUM              Index        RIGHT ATRIUM           Index LA diam:        3.80 cm  1.70 cm/m   RA Area:     20.60 cm LA Vol (A2C):   92.8 ml  41.55 ml/m  RA Volume:   63.70 ml  28.52 ml/m LA Vol (A4C):   98.9 ml  44.28 ml/m LA Biplane Vol: 105.0 ml 47.01 ml/m  AORTIC VALVE                    PULMONIC VALVE AV Area (Vmax):    2.41 cm     PV Vmax:        0.99 m/s AV Area (Vmean):   2.66 cm     PV Peak grad:  4.0 mmHg AV Area (VTI):  2.20 cm AV Vmax:           147.00 cm/s AV Vmean:          99.900 cm/s AV VTI:            0.277 m AV Peak Grad:      8.6 mmHg AV Mean Grad:      5.0 mmHg LVOT Vmax:         78.30 cm/s LVOT Vmean:        58.800 cm/s LVOT VTI:          0.135 m LVOT/AV VTI ratio: 0.49  AORTA Ao Root diam: 2.90 cm Ao Asc diam:  3.30 cm MV E velocity: 101.53 cm/s                             SHUNTS                             Systemic VTI:  0.14 m                             Systemic Diam: 2.40 cm Wilbert Bihari MD Electronically signed by Wilbert Bihari MD Signature Date/Time: 08/10/2024/3:33:20 PM    Final     Cardiac Studies  TTE  IMPRESSIONS     1. Left ventricular ejection fraction, by estimation, is 35 to 40%. The  left ventricle has moderately decreased function. The left ventricle  demonstrates global hypokinesis. Left ventricular diastolic function could  not be evaluated.   2. Right ventricular systolic function is mildly reduced. The right  ventricular size is mildly enlarged. Tricuspid regurgitation signal is  inadequate for assessing PA pressure.   3. Left atrial size was severely dilated.   4. The mitral valve is normal in structure. No evidence of mitral valve  regurgitation. No evidence of mitral stenosis.   5. The aortic valve is tricuspid. Aortic valve regurgitation is not  visualized. Aortic valve sclerosis is present, with no evidence of aortic  valve stenosis. Aortic valve area, by VTI measures 2.20 cm. Aortic valve  mean gradient measures 5.0 mmHg.  Aortic valve Vmax measures 1.47 m/s.   6. The inferior vena cava is dilated in size with >50% respiratory  variability, suggesting right atrial pressure of 8 mmHg.   Patient Profile   [name] is a 63 y.o. 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 - Patient is currently asymptomatic doing well. - Will transition to oral amiodarone  load to finish out 10 g followed by maintenance dosing. - Start 400 mg p.o. amiodarone  3 times daily x8 days followed by 100 mg daily thereafter - The patient will continue Eliquis  5 mg twice daily indefinitely - Anticipating discharge tomorrow with follow-up with [facility] cardiology  #New HFrEF - Likely tachycardia mediated in the setting of RVR. - The patient will need GDMT for HFrEF; however, his blood pressure and heart rate are limiting factors. - I doubt that the patient's blood pressure will be able to tolerate Entresto, so we will try low-dose losartan 12.5 mg  today. - I am optimistic that his heart rate will improve once off the IV amiodarone , and if it does then we will start low-dose metoprolol  as well. - Will also check to see how much an SGLT2 inhibitor will be for him because was.  #CVA s/p Coiling - Takes low-dose Plavix  for prior stenting related to CVA. - Is unlikely that he will need to be on both Eliquis  and Plavix , but will defer to his outpatient cardiology team who knows him better since he has been on this for a long time    For questions or updates, please contact [facility] Please consult [email or web address] for contact info under       Signed, Georganna Archer, MD  08/12/2024, 7:00 AM

## 2024-08-13 DIAGNOSIS — I4891 Unspecified atrial fibrillation: Secondary | ICD-10-CM | POA: Diagnosis not present

## 2024-08-13 LAB — BASIC METABOLIC PANEL WITH GFR
Anion gap: 8 (ref 5–15)
BUN: 15 mg/dL (ref 8–23)
CO2: 25 mmol/L (ref 22–32)
Calcium: 9.4 mg/dL (ref 8.9–10.3)
Chloride: 102 mmol/L (ref 98–111)
Creatinine, Ser: 0.89 mg/dL (ref 0.61–1.24)
GFR, Estimated: >60 mL/min (ref >60)
Glucose, Bld: 112 mg/dL — ABNORMAL HIGH (ref 70–99)
Potassium: 4.4 mmol/L (ref 3.5–5.1)
Sodium: 135 mmol/L (ref 135–145)

## 2024-08-13 MED ORDER — LOSARTAN POTASSIUM 25 MG PO TABS: 25.0000 mg | ORAL_TABLET | Freq: Every day | ORAL | 2 refills | Status: AC

## 2024-08-13 MED ORDER — LOSARTAN POTASSIUM 50 MG PO TABS
25.0000 mg | ORAL_TABLET | Freq: Every day | ORAL | Status: DC
  Administered 2024-08-13: 25 mg via ORAL
  Filled 2024-08-13: qty 1

## 2024-08-13 MED ORDER — EMPAGLIFLOZIN 10 MG PO TABS: 10.0000 mg | ORAL_TABLET | Freq: Every day | ORAL | 2 refills | Status: AC

## 2024-08-13 MED ORDER — EMPAGLIFLOZIN 10 MG PO TABS
10.0000 mg | ORAL_TABLET | Freq: Every day | ORAL | Status: DC
  Administered 2024-08-13: 10 mg via ORAL
  Filled 2024-08-13 (×2): qty 1

## 2024-08-13 MED ORDER — AMIODARONE HCL 100 MG PO TABS: ORAL_TABLET | ORAL | 0 refills | Status: AC

## 2024-08-13 NOTE — Discharge Summary (Signed)
 Physician Discharge Summary  [name] FMW:[id number] DOB: 1961/09/29 DOA: 08/09/2024  PCP: [name], MD  Admit date: 08/09/2024 Discharge date: 08/13/2024  Admitted From: Home  Discharge disposition: Home   Recommendations for Outpatient Follow-Up:   Follow up with your primary care provider in one week.  Check CBC, BMP, magnesium  in the next visit Follow-up with [facility] cardiology as outpatient in 1 to 2 weeks. Patient would benefit from sleep study as outpatient.   Discharge Diagnosis:   Principal Problem:   Atrial fibrillation with rapid ventricular response (HCC) Active Problems:   COPD (chronic obstructive pulmonary disease) (HCC)   GERD (gastroesophageal reflux disease)   HFrEF (heart failure with reduced ejection fraction) (HCC)   Discharge Condition: Improved.  Diet recommendation: Low sodium, heart healthy.    Wound care: None.  Code status: Full.   History of Present Illness:   64 year old with obesity, paroxysmal A-fib on amiodarone  and Eliquis , nonobstructive coronary artery disease, history of TIA, carotid artery disease, chronic venous insufficiency and leg swelling, chronic dyspnea presented to the hospital with dizziness, lightheadedness, chest discomfort and sweating.  In the ED, patient was noted to be in atrial fibrillation with rapid ventricular response at 148 bpm.  Labs were notable for potassium of 3.3 with proBNP at 1300.  TSH was 2.5.  Patient was then admitted hospital for further evaluation and treatment .    Hospital Course:   Following conditions were addressed during hospitalization as listed below,   Persistent atrial fibrillation. Patient initially required amiodarone  infusion.  Previous echocardiogram with normal EF.  TSH within normal.History of DC cardioversion 2023.  2D echocardiogram this time showed LVEF  of 35 to 40% with global hypokinesis.  Seen by cardiology and underwent  cardioversion with conversion to normal  sinus rhythm at the time of my exam.  Patient was then started on amiodarone  high-dose 400 mg 3 times daily and will be continued for next 7 days on discharge.  Patient will resume amiodarone  100 mg daily after 7-day course.  At this time cardiology has cleared the patient for discharge.  Communicated with cardiology at bedside as well.   Sleep apnea: Untreated. will need outpatient sleep study.     Chest pain: atypical.  Negative troponin, review of previous cardiac cath with mild obstructive coronary artery disease.  CT angiogram negative for PE.   Chronic diastolic heart failure: proBNP is elevated.  Leg swelling is chronic.  Received IV Lasix  during hospitalization.  Will resume home Lasix  on discharge.  Patient has been started on losartan while in the hospital and will be continued on discharge.  Patient will also be started on Jardiance on discharge.  GERD: Continue Protonix  on discharge   COPD: No exacerbation.  Continue bronchodilators from home on discharge.   Chronic venous stasis: .  Elevate legs.   Class II obesity.  Body mass index is 37.48 kg/m. Patient would benefit from lifestyle modification and weight loss as outpatient  Disposition.  At this time, patient is stable for disposition home with outpatient PCP and cardiology follow-up at [facility].  Medical Consultants:   Cardiology  Procedures:    DC cardioversion Subjective:   Today, patient was seen and examined at bedside.  Denies any dizziness lightheadedness shortness of breath or dyspnea.  Was able to ambulate in the hallway.  Seen by cardiology today.  Discharge Exam:   Vitals:   08/13/24 0800 08/13/24 0900  BP: (!) 122/44 127/65  Pulse: (!) 56 (!) 55  Resp:  Temp:    SpO2: 94% 92%   Vitals:   08/13/24 0100 08/13/24 0742 08/13/24 0800 08/13/24 0900  BP:   (!) 122/44 127/65  Pulse: 63  (!) 56 (!) 55  Resp:      Temp:      TempSrc:      SpO2: 95% 99% 94% 92%  Weight:      Height:       Body mass  index is 37.48 kg/m.  General: Alert awake, not in obvious distress, obese built HENT: pupils equally reacting to light,  No scleral pallor or icterus noted. Oral mucosa is moist.  Chest: Diminished breath sounds bilaterally. CVS: S1 &S2 heard. No murmur.  Regular rate and rhythm. Abdomen: Soft, nontender, nondistended.  Bowel sounds are heard.   Extremities: No cyanosis, clubbing trace lower extremity edema peripheral pulses are palpable. Psych: Alert, awake and oriented, normal mood CNS:  No cranial nerve deficits.  Power equal in all extremities.   Skin: Warm and dry.  No rashes noted.  The results of significant diagnostics from this hospitalization (including imaging, microbiology, ancillary and laboratory) are listed below for reference.     Diagnostic Studies:   ECHOCARDIOGRAM COMPLETE Result Date: 08/10/2024    ECHOCARDIOGRAM REPORT   Patient Name:   [name] Date of Exam: 08/10/2024 Medical Rec #:  [id number]      Height:       68.0 in Accession #:    [id number]     Weight:       246.5 lb Date of Birth:  January 30, 1961       BSA:          2.234 m Patient Age:    63 years       BP:           124/75 mmHg Patient Gender: M              HR:           92 bpm. Exam Location:  Inpatient Procedure: 2D Echo and Intracardiac Opacification Agent (Both Spectral and Color            Flow Doppler were utilized during procedure). Indications:    Afib  History:        Patient has prior history of Echocardiogram examinations.                 Arrythmias:Atrial Fibrillation.  Sonographer:    Norleen Amour Referring Phys: [id number] SHENG L HALEY IMPRESSIONS  1. Left ventricular ejection fraction, by estimation, is 35 to 40%. The left ventricle has moderately decreased function. The left ventricle demonstrates global hypokinesis. Left ventricular diastolic function could not be evaluated.  2. Right ventricular systolic function is mildly reduced. The right ventricular size is mildly enlarged. Tricuspid  regurgitation signal is inadequate for assessing PA pressure.  3. Left atrial size was severely dilated.  4. The mitral valve is normal in structure. No evidence of mitral valve regurgitation. No evidence of mitral stenosis.  5. The aortic valve is tricuspid. Aortic valve regurgitation is not visualized. Aortic valve sclerosis is present, with no evidence of aortic valve stenosis. Aortic valve area, by VTI measures 2.20 cm. Aortic valve mean gradient measures 5.0 mmHg. Aortic valve Vmax measures 1.47 m/s.  6. The inferior vena cava is dilated in size with >50% respiratory variability, suggesting right atrial pressure of 8 mmHg. FINDINGS  Left Ventricle: Left ventricular ejection fraction, by estimation, is 35 to 40%. The left ventricle has  moderately decreased function. The left ventricle demonstrates global hypokinesis. Definity contrast agent was given IV to delineate the left ventricular endocardial borders. The left ventricular internal cavity size was normal in size. There is no left ventricular hypertrophy. Left ventricular diastolic function could not be evaluated due to atrial fibrillation. Left ventricular diastolic function could not be evaluated. Right Ventricle: The right ventricular size is mildly enlarged. No increase in right ventricular wall thickness. Right ventricular systolic function is mildly reduced. Tricuspid regurgitation signal is inadequate for assessing PA pressure. Left Atrium: Left atrial size was severely dilated. Right Atrium: Right atrial size was normal in size. Pericardium: There is no evidence of pericardial effusion. Mitral Valve: The mitral valve is normal in structure. No evidence of mitral valve regurgitation. No evidence of mitral valve stenosis. Tricuspid Valve: The tricuspid valve is normal in structure. Tricuspid valve regurgitation is trivial. No evidence of tricuspid stenosis. Aortic Valve: The aortic valve is tricuspid. Aortic valve regurgitation is not visualized.  Aortic valve sclerosis is present, with no evidence of aortic valve stenosis. Aortic valve mean gradient measures 5.0 mmHg. Aortic valve peak gradient measures 8.6  mmHg. Aortic valve area, by VTI measures 2.20 cm. Pulmonic Valve: The pulmonic valve was normal in structure. Pulmonic valve regurgitation is not visualized. No evidence of pulmonic stenosis. Aorta: The aortic root is normal in size and structure. Venous: The inferior vena cava is dilated in size with greater than 50% respiratory variability, suggesting right atrial pressure of 8 mmHg. IAS/Shunts: No atrial level shunt detected by color flow Doppler.  LEFT VENTRICLE PLAX 2D LVIDd:         5.70 cm      Diastology LVIDs:         3.90 cm      LV e' medial:    7.62 cm/s LV PW:         1.10 cm      LV E/e' medial:  13.3 LV IVS:        0.90 cm      LV e' lateral:   10.55 cm/s LVOT diam:     2.40 cm      LV E/e' lateral: 9.6 LV SV:         61 LV SV Index:   27 LVOT Area:     4.52 cm  LV Volumes (MOD) LV vol d, MOD A2C: 177.0 ml LV vol d, MOD A4C: 208.0 ml LV vol s, MOD A2C: 85.2 ml LV vol s, MOD A4C: 119.0 ml LV SV MOD A2C:     91.8 ml LV SV MOD A4C:     208.0 ml LV SV MOD BP:      99.7 ml RIGHT VENTRICLE            IVC RV Basal diam:  4.40 cm    IVC diam: 2.10 cm RV S prime:     8.81 cm/s TAPSE (M-mode): 2.0 cm     PULMONARY VEINS                            Diastolic Velocity: 59.60 cm/s                            S/D Velocity:       0.80  Systolic Velocity:  50.10 cm/s LEFT ATRIUM              Index        RIGHT ATRIUM           Index LA diam:        3.80 cm  1.70 cm/m   RA Area:     20.60 cm LA Vol (A2C):   92.8 ml  41.55 ml/m  RA Volume:   63.70 ml  28.52 ml/m LA Vol (A4C):   98.9 ml  44.28 ml/m LA Biplane Vol: 105.0 ml 47.01 ml/m  AORTIC VALVE                    PULMONIC VALVE AV Area (Vmax):    2.41 cm     PV Vmax:       0.99 m/s AV Area (Vmean):   2.66 cm     PV Peak grad:  4.0 mmHg AV Area (VTI):     2.20 cm AV Vmax:            147.00 cm/s AV Vmean:          99.900 cm/s AV VTI:            0.277 m AV Peak Grad:      8.6 mmHg AV Mean Grad:      5.0 mmHg LVOT Vmax:         78.30 cm/s LVOT Vmean:        58.800 cm/s LVOT VTI:          0.135 m LVOT/AV VTI ratio: 0.49  AORTA Ao Root diam: 2.90 cm Ao Asc diam:  3.30 cm MV E velocity: 101.53 cm/s                             SHUNTS                             Systemic VTI:  0.14 m                             Systemic Diam: 2.40 cm Wilbert Bihari MD Electronically signed by Wilbert Bihari MD Signature Date/Time: 08/10/2024/3:33:20 PM    Final    CT Angio Chest/Abd/Pel for Dissection W and/or Wo Contrast Result Date: 08/09/2024 EXAM: CTA CHEST, ABDOMEN AND PELVIS WITH AND WITHOUT CONTRAST 08/09/2024 12:12:52 PM TECHNIQUE: CTA of the chest was performed with and without the administration of 100 mL of iohexol  (OMNIPAQUE ) 350 MG/ML injection. CTA of the abdomen and pelvis was performed with the administration of 100 mL of iohexol  (OMNIPAQUE ) 350 MG/ML injection. Multiplanar reformatted images are provided for review. MIP images are provided for review. Automated exposure control, iterative reconstruction, and/or weight based adjustment of the mA/kV was utilized to reduce the radiation dose to as low as reasonably achievable. COMPARISON: 06/02/2024 and previous. CLINICAL HISTORY: Acute aortic syndrome (AAS) suspected. Claudication or leg ischemia, right lower extremity claudication. FINDINGS: VASCULATURE: AORTA: Atheromatous thoracic aorta with minimal calcified plaque. No abdominal aortic aneurysm. No dissection. PULMONARY ARTERIES: No pulmonary embolism with the limits of this exam. GREAT VESSELS OF AORTIC ARCH: No acute finding. No dissection. No arterial occlusion or significant stenosis. CELIAC TRUNK: No acute finding. No occlusion or significant stenosis. SUPERIOR MESENTERIC ARTERY: No acute finding. No occlusion or significant stenosis. INFERIOR MESENTERIC ARTERY: No  acute finding. No  occlusion or significant stenosis. RENAL ARTERIES: Two Right renal arteries, superior dominant, both patent. Aberrant trunk arising below the level of the IMA supplying both kidneys, widely patent proximally. Single left renal artery, patent. ILIAC ARTERIES: No acute finding. No occlusion or significant stenosis. CHEST: MEDIASTINUM: Subcentimeter prevascular and precarinal lymph nodes. Moderate scattered coronary calcifications. The heart and pericardium demonstrate no acute abnormality. LUNGS AND PLEURA: The lungs are without acute process. No focal consolidation or pulmonary edema. No evidence of pleural effusion or pneumothorax. THORACIC BONES AND SOFT TISSUES: Vertebral endplate spurring at multiple levels in the lower thoracic spine. No acute soft tissue abnormality. ABDOMEN AND PELVIS: LIVER: The liver is unremarkable. GALLBLADDER AND BILE DUCTS: Gallbladder is unremarkable. No biliary ductal dilatation. SPLEEN: The spleen is unremarkable. PANCREAS: The pancreas is unremarkable. ADRENAL GLANDS: Bilateral adrenal glands demonstrate no acute abnormality. KIDNEYS, URETERS AND BLADDER: Horseshoe kidney. No stones in the kidneys or ureters. No hydronephrosis. No perinephric or periureteral stranding. Urinary bladder is unremarkable. GI AND BOWEL: Stomach and duodenal sweep demonstrate no acute abnormality. There is no bowel obstruction. No abnormal bowel wall thickening or distension. REPRODUCTIVE: Prostate enlargement with central coarse calcifications. PERITONEUM AND RETROPERITONEUM: No ascites or free air. LYMPH NODES: No lymphadenopathy. ABDOMINAL BONES AND SOFT TISSUES: Mild facet DJD L4 - S1 right worse than left. No acute soft tissue abnormality. IMPRESSION: 1. No evidence of acute aortic syndrome, dissection, or aneurysm. 2. No pulmonary embolism. 3. Horseshoe kidney. No stones or hydronephrosis. 4. Prostatomegaly. Electronically signed by: Katheleen Faes MD 08/09/2024 12:45 PM EDT RP Workstation: [id number]    CT Angio Aortobifemoral W and/or Wo Contrast Result Date: 08/09/2024 EXAM: CTA AORTOBIFEM WITH RUNOFF 08/09/2024 12:12:52 PM TECHNIQUE: Contrast-enhanced computed tomography angiography of the lower extremity was performed with multiplanar reconstructions. Maximum intensity projection images were created on a separate workstation and reviewed. Automated exposure control, iterative reconstruction, and/or weight based adjustment of the mA/kV was utilized to reduce the radiation dose to as low as reasonably achievable. CONTRAST: 100mL iohexol  (OMNIPAQUE ) 350 MG/ML injection. COMPARISON: None available. CLINICAL HISTORY: Claudication or leg ischemia; Right lower extremity claudication. Acute aortic syndrome (AAS) suspected; Claudication or leg ischemia, Right lower extremity claudication. FINDINGS: ARTERIAL: AORTA: Moderate calcified nonocclusive plaque in the visualized distal abdominal aorta without aneurysm. RIGHT LOWER EXTREMITY: RIGHT COMMON ILIAC ARTERY: Mildly atheromatous, patent. Mild origin stenosis right internal iliac artery, patent distally. RIGHT EXTERNAL ILIAC ARTERY: No significant stenosis or vessel occlusion. RIGHT COMMON FEMORAL ARTERY: Minimal plaque in the distal common femoral artery. RIGHT SUPERFICIAL FEMORAL ARTERY: Mild scattered calcified plaque without high-grade stenosis. RIGHT POPLITEAL ARTERY: Minimal plaque in the distal right popliteal artery without stenosis. RIGHT TIBIOPERONEAL TRUNK: atheromatous but apparently contiguous 3-vessel right tibial runoff. RIGHT ANTERIOR TIBIAL ARTERY: Flow is identified in the anterior tibial artery to the ankle. Flow identified in the dorsalis pedis artery. RIGHT PERONEAL ARTERY: Flow is identified to the ankle. RIGHT POSTERIOR TIBIAL ARTERY: No significant stenosis or vessel occlusion. Posterior tibial artery flow is present into the hindfoot. LEFT LOWER EXTREMITY: LEFT COMMON ILIAC ARTERY: Mild plaque. No plaque in the internal iliac. LEFT  EXTERNAL ILIAC ARTERY: No significant stenosis or vessel occlusion. LEFT COMMON FEMORAL ARTERY: Mild plaque in the distal common femoral artery. LEFT SUPERFICIAL FEMORAL ARTERY: No significant stenosis or vessel occlusion. LEFT POPLITEAL ARTERY: Minimal plaque. LEFT TIBIOPERONEAL TRUNK: Minimally atheromatous but apparently contiguous 3-vessel tibial runoff, with some venous opacification limiting evaluation distally. LEFT ANTERIOR TIBIAL ARTERY: Flow is identified in the anterior tibial  artery to the ankle. Flow identified in the dorsalis pedis artery. LEFT PERONEAL ARTERY: Flow is identified to the ankle. LEFT POSTERIOR TIBIAL ARTERY: No significant stenosis or vessel occlusion. Posterior tibial artery flow is present into the hindfoot. BONES AND SOFT TISSUES: Horseshoe kidney, incompletely visualized. Coarse calcifications in the prostate, mildly enlarged. No significant osseous abnormalities seen within the field of view. IMPRESSION: 1. Moderate calcified nonocclusive plaque in the distal abdominal aorta without aneurysm. 2. No hemodynamically significant lower extremity arterial occlusive disease identified. Electronically signed by: Katheleen Faes MD 08/09/2024 12:30 PM EDT RP Workstation: [id number]   DG Chest Portable 1 View Result Date: 08/09/2024 EXAM: 1 VIEW XRAY OF THE CHEST 08/09/2024 10:58:38 AM COMPARISON: 07/03/2024 CLINICAL HISTORY: Afib Rvr. Pt arrived via Pov, c/o states he woke feeling weak and right leg has been cold and hot. Hr 140s in triage. Hx of afib, states needed cardioversion the last time he had this issue. FINDINGS: LUNGS AND PLEURA: No focal pulmonary opacity. No pulmonary edema. No pleural effusion. No pneumothorax. HEART AND MEDIASTINUM: Numerous leads and wires over chest are noted. Normal heart size and mediastinal contours . BONES AND SOFT TISSUES: Lordotic positioning noted. No acute osseous abnormality. IMPRESSION: 1. No acute cardiopulmonary process. Electronically signed  by: Norleen Kil MD 08/09/2024 12:07 PM EDT RP Workstation: [id number]     Labs:   Basic Metabolic Panel: Recent Labs  Lab 08/09/24 1101 08/10/24 0324 08/11/24 0324 08/12/24 0244 08/13/24 0240  NA 141 137 139 138 135  K 3.3* 3.6 3.7 4.1 4.4  CL 98 99 100 102 102  CO2  --  28 29 26 25   GLUCOSE 130* 113* 116* 120* 112*  BUN 13 12 13 14 15   CREATININE 1.10 0.93 0.86 0.84 0.89  CALCIUM   --  9.0 9.6 9.3 9.4  MG  --  2.1  --  2.2  --   PHOS  --  3.4  --   --   --    GFR Estimated Creatinine Clearance: 104.4 mL/min (by C-G formula based on SCr of 0.89 mg/dL). Liver Function Tests: Recent Labs  Lab 08/10/24 0324  AST 18  ALT 16  ALKPHOS 88  BILITOT 0.5  PROT 6.5  ALBUMIN 3.8   No results for input(s): LIPASE, AMYLASE in the last 168 hours. No results for input(s): AMMONIA in the last 168 hours. Coagulation profile Recent Labs  Lab 08/09/24 1055  INR 1.1    CBC: Recent Labs  Lab 08/09/24 1055 08/09/24 1101 08/10/24 0324 08/12/24 0244  WBC 8.8  --  7.1 8.0  NEUTROABS 5.6  --   --   --   HGB 14.5 15.0 12.3* 13.2  HCT 44.1 44.0 37.8* 42.8  MCV 89.6  --  89.4 91.6  PLT 269  --  218 237   Cardiac Enzymes: No results for input(s): CKTOTAL, CKMB, CKMBINDEX, TROPONINI in the last 168 hours. BNP: Invalid input(s): POCBNP CBG: No results for input(s): GLUCAP in the last 168 hours. D-Dimer No results for input(s): DDIMER in the last 72 hours. Hgb A1c No results for input(s): HGBA1C in the last 72 hours. Lipid Profile No results for input(s): CHOL, HDL, LDLCALC, TRIG, CHOLHDL, LDLDIRECT in the last 72 hours. Thyroid  function studies No results for input(s): TSH, T4TOTAL, T3FREE, THYROIDAB in the last 72 hours.  Invalid input(s): FREET3 Anemia work up No results for input(s): VITAMINB12, FOLATE, FERRITIN, TIBC, IRON, RETICCTPCT in the last 72 hours. Microbiology No results found for this or any previous  visit (from the past 240 hours).   Discharge Instructions:   Discharge Instructions     Diet - low sodium heart healthy   Complete by: As directed    Discharge instructions   Complete by: As directed    Follow-up with your primary care provider in 1 week.  Check blood work at that time.  Seek medical attention for worsening symptoms. Do not overexert. Follow up with cardiology as scheduled by the clinic at [facility] .   Increase activity slowly   Complete by: As directed       Allergies as of 08/13/2024       Reactions   Tylox [oxycodone -acetaminophen ] Hives, Rash, Other (See Comments)   Sweating, shaking, tremors and diaphoresis also   Gabapentin  Other (See Comments)   Gained weight and retained fluid   Tape Dermatitis, Rash, Other (See Comments)   States IV tape is fine        Medication List     STOP taking these medications    amoxicillin-clavulanate 875-125 MG tablet Commonly known as: AUGMENTIN   neomycin-polymyxin-hydrocortisone OTIC solution Commonly known as: CORTISPORIN       TAKE these medications    albuterol  108 (90 Base) MCG/ACT inhaler Commonly known as: VENTOLIN  HFA Inhale 2 puffs into the lungs every 6 (six) hours as needed for wheezing or shortness of breath.   amiodarone  100 MG tablet Commonly known as: PACERONE  Take 4 tablets (400 mg total) by mouth 3 (three) times daily for 7 days, THEN 1 tablet (100 mg total) daily. Start taking on: August 13, 2024 What changed: See the new instructions.   apixaban  5 MG Tabs tablet Commonly known as: ELIQUIS  Take 1 tablet (5 mg total) by mouth 2 (two) times daily.   atorvastatin  40 MG tablet Commonly known as: LIPITOR Take 40 mg by mouth daily.   Breztri  Aerosphere 160-9-4.8 MCG/ACT Aero inhaler Generic drug: budesonide -glycopyrrolate -formoterol Inhale 2 puffs into the lungs in the morning and at bedtime.   clobetasol  0.05 % external solution Commonly known as: TEMOVATE  Apply 1 Application  topically 2 (two) times daily.   clopidogrel  75 MG tablet Commonly known as: PLAVIX  Take 0.5 tablets (37.5 mg total) by mouth daily. What changed: when to take this   empagliflozin 10 MG Tabs tablet Commonly known as: JARDIANCE Take 1 tablet (10 mg total) by mouth daily.   furosemide  40 MG tablet Commonly known as: LASIX  Take 40 mg by mouth in the morning and at bedtime.   isosorbide mononitrate 30 MG 24 hr tablet Commonly known as: IMDUR Take 30 mg by mouth daily.   losartan 25 MG tablet Commonly known as: COZAAR Take 1 tablet (25 mg total) by mouth daily. Start taking on: August 14, 2024   MAGOX 400 PO Take 1 Piece by mouth See admin instructions. Chew 400 mg (1 gummie) by mouth in the morning and at bedtime   nitroGLYCERIN  0.4 MG SL tablet Commonly known as: NITROSTAT  Place 0.4 mg under the tongue every 5 (five) minutes as needed for chest pain.   omeprazole 40 MG capsule Commonly known as: PRILOSEC Take 40 mg by mouth daily before breakfast.   oxyCODONE  5 MG immediate release tablet Commonly known as: Oxy IR/ROXICODONE  Take 1 tablet (5 mg total) by mouth every 4 (four) hours as needed for up to 8 doses for severe pain (pain score 7-10).   pantoprazole  40 MG tablet Commonly known as: PROTONIX  Take 1 tablet (40 mg total) by mouth daily as needed (for heartburn).  phentermine 37.5 MG capsule Take 37.5 mg by mouth in the morning.   potassium chloride  10 MEQ tablet Commonly known as: KLOR-CON  Take 2 tablets (20 mEq total) by mouth daily.        Follow-up Information     [name], MD Follow up in 1 week(s).   Specialty: Family Medicine Contact information: [address] [facility] [place] [address] [phone]                  Time coordinating discharge: 39 minutes  Signed:  Ceonna Frazzini  [facility] 08/13/2024, 2:03 PM

## 2024-08-13 NOTE — Progress Notes (Signed)
 Rounding Note   Patient Name: [name] Date of Encounter: 08/13/2024  [facility] Cardiologist: None   Subjective - No acute events overnight - Patient walked several laps around the unit yesterday - He says he feels great and is ready to leave  Scheduled Meds:  amiodarone   400 mg Oral TID   Followed by   NOREEN ON 08/20/2024] amiodarone   100 mg Oral Daily   apixaban   5 mg Oral BID   atorvastatin   40 mg Oral Daily   budesonide -glycopyrrolate -formoterol  2 puff Inhalation BID   Chlorhexidine  Gluconate Cloth  6 each Topical Daily   clopidogrel   37.5 mg Oral Daily   losartan  12.5 mg Oral Daily   pantoprazole   40 mg Oral Daily   potassium chloride   20 mEq Oral Daily   Continuous Infusions:  PRN Meds: albuterol , nitroGLYCERIN , mouth rinse   Vital Signs  Vitals:   08/13/24 0000 08/13/24 0100 08/13/24 0742 08/13/24 0800  BP:    (!) 122/44  Pulse: 63 63  (!) 56  Resp:      Temp:      TempSrc:      SpO2: 94% 95% 99% 94%  Weight:      Height:        Intake/Output Summary (Last 24 hours) at 08/13/2024 0847 Last data filed at 08/13/2024 0200 Gross per 24 hour  Intake 28.62 ml  Output 1900 ml  Net -1871.38 ml      08/09/2024    4:17 PM 07/03/2024    9:44 AM 04/21/2024    8:56 AM  Last 3 Weights  Weight (lbs) 246 lb 7.6 oz 262 lb 242 lb 8.1 oz  Weight (kg) 111.8 kg 118.842 kg 110 kg      Telemetry Sinus bradycardia- Personally Reviewed  ECG  No new ECG  Physical Exam  GEN: No acute distress.   Neck: No JVD Cardiac: RRR, no murmurs, rubs, or gallops.  Respiratory: Clear to auscultation bilaterally. GI: Soft, nontender, non-distended  MS: No edema; No deformity. Neuro:  Nonfocal  Psych: Normal affect   Labs High Sensitivity Troponin:  No results for input(s): TROPONINIHS in the last 720 hours.   Chemistry Recent Labs  Lab 08/10/24 0324 08/11/24 0324 08/12/24 0244 08/13/24 0240  NA 137 139 138 135  K 3.6 3.7 4.1 4.4  CL 99 100  102 102  CO2 28 29 26 25   GLUCOSE 113* 116* 120* 112*  BUN 12 13 14 15   CREATININE 0.93 0.86 0.84 0.89  CALCIUM  9.0 9.6 9.3 9.4  MG 2.1  --  2.2  --   PROT 6.5  --   --   --   ALBUMIN 3.8  --   --   --   AST 18  --   --   --   ALT 16  --   --   --   ALKPHOS 88  --   --   --   BILITOT 0.5  --   --   --   GFRNONAA >60 >60 >60 >60  ANIONGAP 10 10 10 8     Lipids No results for input(s): CHOL, TRIG, HDL, LABVLDL, LDLCALC, CHOLHDL in the last 168 hours.  Hematology Recent Labs  Lab 08/09/24 1055 08/09/24 1101 08/10/24 0324 08/12/24 0244  WBC 8.8  --  7.1 8.0  RBC 4.92  --  4.23 4.67  HGB 14.5 15.0 12.3* 13.2  HCT 44.1 44.0 37.8* 42.8  MCV 89.6  --  89.4  91.6  MCH 29.5  --  29.1 28.3  MCHC 32.9  --  32.5 30.8  RDW 13.0  --  13.0 13.0  PLT 269  --  218 237   Thyroid   Recent Labs  Lab 08/09/24 1625  TSH 2.500    BNP Recent Labs  Lab 08/09/24 1055  PROBNP 1,350.0*    DDimer No results for input(s): DDIMER in the last 168 hours.   Radiology  EP STUDY Result Date: 08/11/2024 See surgical note for result.   Cardiac Studies TTE   IMPRESSIONS     1. Left ventricular ejection fraction, by estimation, is 35 to 40%. The  left ventricle has moderately decreased function. The left ventricle  demonstrates global hypokinesis. Left ventricular diastolic function could  not be evaluated.   2. Right ventricular systolic function is mildly reduced. The right  ventricular size is mildly enlarged. Tricuspid regurgitation signal is  inadequate for assessing PA pressure.   3. Left atrial size was severely dilated.   4. The mitral valve is normal in structure. No evidence of mitral valve  regurgitation. No evidence of mitral stenosis.   5. The aortic valve is tricuspid. Aortic valve regurgitation is not  visualized. Aortic valve sclerosis is present, with no evidence of aortic  valve stenosis. Aortic valve area, by VTI measures 2.20 cm. Aortic valve  mean  gradient measures 5.0 mmHg.  Aortic valve Vmax measures 1.47 m/s.   6. The inferior vena cava is dilated in size with >50% respiratory  variability, suggesting right atrial pressure of 8 mmHg.   Patient Profile   [name] is a 63 y.o. 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 - Patient is currently asymptomatic doing well. -Continue 400 mg p.o. amiodarone  3 times daily x8 days followed by 100 mg daily thereafter - The patient will continue Eliquis  5 mg twice daily indefinitely - The patient can discharge today with cardiology follow-up at [facility]   #New HFrEF - Likely tachycardia mediated in the setting of RVR. - The patient will need GDMT for HFrEF; however, his blood pressure and heart rate are limiting factors. - I doubt that the patient's blood pressure will be able to tolerate Entresto.  He tolerated the low-dose losartan yesterday so we will increase to 25 mg daily - The patient remains bradycardic so unable to start beta-blocker unfortunately - Unable to determine the cost of an SGLT2 since is the weekend.  I will prescribe it to the patient, but instructed him to not fill it if the price will be too expensive.  He verbalizes an understanding.   #CVA s/p Coiling - Takes low-dose Plavix  for prior stenting related to CVA. - Not sure that he really needs to be on both Plavix  and Eliquis .  Will stop Plavix .    [facility] will sign off.   The patient is ready for discharge today from a cardiac standpoint. Medication Recommendations: Start losartan 25 mg daily, continue p.o. amiodarone  load as prescribed, start Jardiance Other recommendations (labs, testing, etc): BMP Follow up as an outpatient: With [facility] cardiology For  questions or updates, please contact [facility] Please consult [email or web address] for contact info under  Signed, Georganna Archer, MD  08/13/2024, 8:47 AM

## 2024-08-13 NOTE — Plan of Care (Signed)
  Problem: Education: Goal: Knowledge of General Education information will improve Description: Including pain rating scale, medication(s)/side effects and non-pharmacologic comfort measures Outcome: Progressing   Problem: Health Behavior/Discharge Planning: Goal: Ability to manage health-related needs will improve Outcome: Progressing   Problem: Nutrition: Goal: Adequate nutrition will be maintained Outcome: Progressing   Problem: Elimination: Goal: Will not experience complications related to bowel motility Outcome: Progressing   Problem: Pain Managment: Goal: General experience of comfort will improve and/or be controlled Outcome: Progressing   Problem: Safety: Goal: Ability to remain free from injury will improve Outcome: Progressing   Problem: Skin Integrity: Goal: Risk for impaired skin integrity will decrease Outcome: Progressing

## 2024-08-14 NOTE — Progress Notes (Signed)
 08/14/2024  [facility]   Patient ID:  [name] is a 63 y.o. (DOB 1960-10-21) male.  Assessment and Plan  [name] was seen today for tcm.  Diagnoses and all orders for this visit:  Transition of care  History of atrial fibrillation -     CBC And Differential; Future -     Comprehensive Metabolic Panel; Future -     Magnesium ; Future  OSA (obstructive sleep apnea) -     Ambulatory Referral to Sleep Consult; Future  Hospital records and labs reviewed.  Orders placed for follow-up blood work next Monday. Assessment & Plan 1. Atrial Fibrillation: - The patient's atrial fibrillation has resolved following cardioversion.  Normal sinus rhythm today. - He was recently hospitalized and discharged on 08/13/2024. He was switched from Plavix  to Eliquis  and started on amiodarone . He is currently on amiodarone  400 mg 3 times daily for 1 week, then will reduce to 100 mg daily. He is also taking Eliquis  twice a day. - Blood work conducted at the time of hospital discharge was within normal limits. He is advised to discontinue Augmentin due to potential interactions with amiodarone . - A lab visit has been scheduled for 08/21/2024 to monitor his CBC, BMP, and magnesium  levels. He is instructed to follow up with his cardiologist within the next 2 to 3 weeks.  2. Sleep Apnea: - The patient has a diagnosis of sleep apnea but is not currently on CPAP therapy. - A referral to the sleep center will be made for further evaluation and management.  Follow-up: The patient is scheduled for a lab visit on 08/21/2024. Follow-up with the cardiologist is recommended within the next 2 to 3 weeks.  He does have a scheduled follow-up with me for diabetes check in January.  Health Maintenance Due  Topic Date Due  . Zoster Vaccine (1 of 2) Never done  . RSV Adult and Pregnancy (1 - Risk 50-74 years 1-dose series) Never done     Risks, benefits, and alternatives of the medications and  treatment plan prescribed today were discussed, and patient expressed understanding. Plan follow-up as discussed or as needed if any worsening symptoms or change in condition.    A yearly preventative health exam was recommended and current age based recommendations were discussed.   Subjective   Patient ID:  [name] is a 63 y.o. (DOB [birth year]) male    Patient presents with  . TCM     History of Present Illness The patient presents for atrial fibrillation and sleep apnea.  He experienced an episode of atrial fibrillation, characterized by cold sweats and dizziness, which prompted him to seek immediate medical attention. Upon arrival at the hospital, his heart rate was recorded as 154, 268. He was subsequently admitted to the stepdown unit, where he received intravenous therapy in both arms. On Friday, he underwent cardioversion and was discharged on Sunday after being monitored for potential reactions to new medications. His current medication regimen includes amiodarone  400 mg daily for 1 weeks, after which the dosage will be reduced to 100 mg for the subsequent 4 weeks. He is also on Eliquis  twice daily, having been switched from Plavix . He has been advised to consult with his primary care physician within a week of his hospital discharge. He has a scheduled appointment with his cardiologist, Dr. Uvaldo in June 2026. His daily medications include Lasix , Imdur, nitroglycerin , and atorvastatin .  He has a history of sleep apnea and has been informed that this  condition could potentially trigger atrial fibrillation. He is not currently using a CPAP machine. He has a scheduled sleep apnea test on 09/2024  He has previously undergone testing for sleep apnea and was provided with a [employer] machine, which was later recalled. He returned the machine but has not received a replacement in the past 2 years.  He has a CT scan of the chest scheduled for 09/11/2024.   Current Outpatient Medications   Medication Instructions  . albuterol  sulfate HFA (PROVENTIL ,VENTOLIN ,PROAIR ) 108 (90 Base) MCG/ACT inhaler INHALE TWO PUFFS INTO THE LUNGS EVERY 6 HOURS AS NEEDED.  . amiodarone  (PACERONE ) 100 mg tablet Oral, Take 400 mg for 4 weeks then back to 100mg    . atorvastatin  (LIPITOR) 40 mg, Oral, Daily  . Budeson-Glycopyrrol-Formoterol (BREZTRI  AEROSPHERE) 160-9-4.8 MCG/ACT AERO 2 puffs, Inhalation, 2 times a day  . clopidogrel  bisulfate (PLAVIX ) 37.5 mg, 2 times a day  . diclofenac sodium (VOLTAREN) 1 % gel 2 grams two to four times daily  . ELIQUIS  5 mg, 2 times a day  . empagliflozin (JARDIANCE) 10 mg, Daily  . esomeprazole magnesium  (NEXIUM) 40 mg, Daily  . fluocinonide  (LIDEX ) 0.05 % cream 2 times a day  . furosemide  (LASIX ) 40 mg, 2 times a day  . isosorbide mononitrate (IMDUR) 30 mg, Oral, Daily  . losartan potassium (COZAAR) 25 mg, Daily  . magnesium  oxide (MAG-OX) 400 mg, Daily  . nitroGLYCERIN  (NITROSTAT ) 0.4 mg SL tablet DISSOLVE 1 TABLET UNDER THE TONGUE EVERY 5 MINUTES AS  NEEDED FOR CHEST PAIN. MAX  OF 3 TABLETS IN 15 MINUTES. CALL 911 IF PAIN PERSISTS.  SABRA omeprazole (PRILOSEC) 40 mg, Daily  . pantoprazole  sodium (PROTONIX ) 40 mg, Every morning  . phentermine 37.5 mg, Oral, Every morning   Patient Care Team: Ivan FORBES Bilis, MD as PCP - General (Family Medicine) Ivan GORMAN Blade, MD as Consulting Physician (Vascular Surgery) Ivan Equilla Just, MD (Gastroenterology) Ivan Gores, MD (Sleep Medicine) Ivan VEAR Pique, MD as Consulting Physician (Neurology) Ivan ONEIDA Jaeger, MD as Consulting Physician (Hand Surgery) Ivan MARLA Lennox, MD as Consulting Physician (Cardiovascular Disease) Social History   Tobacco Use  . Smoking status: Former    Current packs/day: 0.00    Average packs/day: 2.0 packs/day for 39.0 years (78.0 ttl pk-yrs)    Types: Cigarettes    Start date: 41    Quit date: 2015    Years since quitting: 10.8    Passive exposure: Past  . Smokeless  tobacco: Never  Substance Use Topics  . Alcohol use: Yes    Alcohol/week: 0.0 - 1.0 standard drinks of alcohol    Comment: 03/22/2022 quit    Reviewed and updated this visit by provider: Tobacco  Allergies  Meds  Problems  Med Hx  Surg Hx  Fam Hx       Review of Systems is complete and negative except as noted.  Objective   Vitals:   08/14/24 1338  BP: 138/90  Pulse: 71  Temp: 97.5 F (36.4 C)  TempSrc: Temporal  Resp: 18  Height: 5' 8 (1.727 m)  Weight: 249 lb (112.9 kg)  SpO2: 93%  BMI (Calculated): 37.9   Wt Readings from Last 3 Encounters:  08/14/24 249 lb (112.9 kg)  07/31/24 251 lb (113.9 kg)  07/24/24 255 lb (115.7 kg)      Constitutional: Well-developed and well-nourished.  Sitting comfortably conversing normally.  Eyes: Conjunctivae, lids, and EOM are normal. Pupils are equal, round, and reactive to light. Lymphatics: There is no anterior  or posterior cervical adenopathy. Neck: Supple with normal range of motion. No thyroid  mass and no thyromegaly present. Cardiovascular: Regular rate and rhythm with normal heart sounds and no edema present. There are no carotid bruits. Respiratory: Normal effort and clear to auscultation without wheezing or prolonged expiration. Musculoskeletal: Gait is normal. Upper and Lower extremities are symmetrical with grossly normal muscle strength and tone with normal range of motion.  Skin: Skin is warm and dry. No rashes or bruising noted.  Psychiatric: Behavior is Cooperative and Polite. Mood euthymyic. Affect is appropriate.  Computer technology was used to create visit note. Consent from the patient/caregiver was obtained prior to its use.  Ivan FORBES Bilis, MD

## 2024-09-11 ENCOUNTER — Ambulatory Visit: Admission: RE | Admit: 2024-09-11 | Discharge: 2024-09-11 | Disposition: A | Source: Ambulatory Visit

## 2024-09-11 DIAGNOSIS — R911 Solitary pulmonary nodule: Secondary | ICD-10-CM

## 2024-09-20 ENCOUNTER — Telehealth: Payer: Self-pay

## 2024-09-20 ENCOUNTER — Encounter

## 2024-09-20 NOTE — Telephone Encounter (Signed)
 Spoke to pt. He stated that he is not currently using cpap. He is scheduled for a upcoming sleep study. He would like to keep 09/21/2024 visit to discuss pulmonary issues.

## 2024-09-21 ENCOUNTER — Ambulatory Visit

## 2024-11-08 ENCOUNTER — Telehealth: Payer: Self-pay

## 2024-11-08 DIAGNOSIS — G4733 Obstructive sleep apnea (adult) (pediatric): Secondary | ICD-10-CM

## 2024-11-08 NOTE — Telephone Encounter (Signed)
 Received fax from [employer] stating that the pt's CPAP order is on hold pending the results of a new sleep study/ Per [employer], once we have results, we must fax to [phone].   Dr. Pawar, [employer] is requesting new sleep study to continue processing pt's CPAP order placed in September. Please advise request for new sleep study.

## 2024-11-10 NOTE — Telephone Encounter (Signed)
 Ordered NFN

## 2024-12-27 ENCOUNTER — Encounter

## 2024-12-27 ENCOUNTER — Ambulatory Visit

## 2025-01-25 ENCOUNTER — Ambulatory Visit: Admitting: Dermatology
